# Patient Record
Sex: Male | Born: 1945 | Race: Black or African American | Hispanic: No | Smoking: Never smoker
Health system: Southern US, Community
[De-identification: ages and names within clinical notes are randomized; demographics above are authoritative.]

## PROBLEM LIST (undated history)

## (undated) DIAGNOSIS — D649 Anemia, unspecified: Secondary | ICD-10-CM

## (undated) DIAGNOSIS — E119 Type 2 diabetes mellitus without complications: Secondary | ICD-10-CM

## (undated) DIAGNOSIS — Z89519 Acquired absence of unspecified leg below knee: Secondary | ICD-10-CM

## (undated) DIAGNOSIS — I10 Essential (primary) hypertension: Secondary | ICD-10-CM

## (undated) DIAGNOSIS — S91301A Unspecified open wound, right foot, initial encounter: Secondary | ICD-10-CM

## (undated) DIAGNOSIS — I96 Gangrene, not elsewhere classified: Secondary | ICD-10-CM

## (undated) DIAGNOSIS — N189 Chronic kidney disease, unspecified: Secondary | ICD-10-CM

## (undated) DIAGNOSIS — I509 Heart failure, unspecified: Secondary | ICD-10-CM

## (undated) DIAGNOSIS — L03115 Cellulitis of right lower limb: Secondary | ICD-10-CM

## (undated) DIAGNOSIS — J189 Pneumonia, unspecified organism: Secondary | ICD-10-CM

## (undated) HISTORY — DX: Unspecified open wound, right foot, initial encounter: S91.301A

## (undated) HISTORY — DX: Cellulitis of right lower limb: L03.115

## (undated) HISTORY — PX: HAND SURGERY: SHX662

## (undated) HISTORY — DX: Gangrene, not elsewhere classified: I96

---

## 1998-01-08 ENCOUNTER — Ambulatory Visit (HOSPITAL_COMMUNITY): Admission: RE | Admit: 1998-01-08 | Discharge: 1998-01-08 | Payer: Self-pay | Admitting: Internal Medicine

## 1999-09-29 ENCOUNTER — Emergency Department (HOSPITAL_COMMUNITY): Admission: EM | Admit: 1999-09-29 | Discharge: 1999-09-30 | Payer: Self-pay | Admitting: Emergency Medicine

## 1999-09-29 ENCOUNTER — Encounter: Payer: Self-pay | Admitting: Emergency Medicine

## 1999-11-16 ENCOUNTER — Emergency Department (HOSPITAL_COMMUNITY): Admission: EM | Admit: 1999-11-16 | Discharge: 1999-11-16 | Payer: Self-pay | Admitting: Emergency Medicine

## 1999-11-16 ENCOUNTER — Encounter: Payer: Self-pay | Admitting: *Deleted

## 2000-09-07 ENCOUNTER — Emergency Department (HOSPITAL_COMMUNITY): Admission: EM | Admit: 2000-09-07 | Discharge: 2000-09-07 | Payer: Self-pay | Admitting: Emergency Medicine

## 2004-03-04 ENCOUNTER — Encounter: Admission: RE | Admit: 2004-03-04 | Discharge: 2004-06-02 | Payer: Self-pay | Admitting: Family Medicine

## 2005-05-30 ENCOUNTER — Emergency Department (HOSPITAL_COMMUNITY): Admission: EM | Admit: 2005-05-30 | Discharge: 2005-05-30 | Payer: Self-pay | Admitting: Family Medicine

## 2006-05-14 ENCOUNTER — Emergency Department (HOSPITAL_COMMUNITY): Admission: EM | Admit: 2006-05-14 | Discharge: 2006-05-14 | Payer: Self-pay | Admitting: Emergency Medicine

## 2010-12-01 ENCOUNTER — Inpatient Hospital Stay (INDEPENDENT_AMBULATORY_CARE_PROVIDER_SITE_OTHER)
Admission: RE | Admit: 2010-12-01 | Discharge: 2010-12-01 | Disposition: A | Discharge status: Home / Self Care | Payer: BC Managed Care – PPO | Source: Ambulatory Visit | Attending: Family Medicine | Admitting: Family Medicine

## 2010-12-01 DIAGNOSIS — R7989 Other specified abnormal findings of blood chemistry: Secondary | ICD-10-CM

## 2010-12-01 DIAGNOSIS — E119 Type 2 diabetes mellitus without complications: Secondary | ICD-10-CM

## 2010-12-01 LAB — POCT URINALYSIS DIP (DEVICE)
Bilirubin Urine: NEGATIVE
Glucose, UA: 500 mg/dL — AB
Ketones, ur: NEGATIVE mg/dL
Leukocytes, UA: NEGATIVE
Nitrite: NEGATIVE
Protein, ur: NEGATIVE mg/dL
Specific Gravity, Urine: 1.01 (ref 1.005–1.030)
Urobilinogen, UA: 0.2 mg/dL (ref 0.0–1.0)
pH: 5 (ref 5.0–8.0)

## 2010-12-01 LAB — GLUCOSE, CAPILLARY: Glucose-Capillary: 545 mg/dL — ABNORMAL HIGH (ref 70–99)

## 2012-04-15 ENCOUNTER — Emergency Department (HOSPITAL_COMMUNITY)
Admission: EM | Admit: 2012-04-15 | Discharge: 2012-04-15 | Discharge status: Against Medical Advice | Payer: BC Managed Care – PPO | Attending: Emergency Medicine | Admitting: Emergency Medicine

## 2012-04-15 ENCOUNTER — Encounter (HOSPITAL_COMMUNITY): Payer: Self-pay | Admitting: Emergency Medicine

## 2012-04-15 DIAGNOSIS — Y939 Activity, unspecified: Secondary | ICD-10-CM | POA: Insufficient documentation

## 2012-04-15 DIAGNOSIS — S61209A Unspecified open wound of unspecified finger without damage to nail, initial encounter: Secondary | ICD-10-CM | POA: Insufficient documentation

## 2012-04-15 DIAGNOSIS — Y929 Unspecified place or not applicable: Secondary | ICD-10-CM | POA: Insufficient documentation

## 2012-04-15 DIAGNOSIS — W230XXA Caught, crushed, jammed, or pinched between moving objects, initial encounter: Secondary | ICD-10-CM | POA: Insufficient documentation

## 2012-04-15 HISTORY — DX: Type 2 diabetes mellitus without complications: E11.9

## 2012-04-15 HISTORY — DX: Essential (primary) hypertension: I10

## 2012-04-15 NOTE — ED Notes (Signed)
No answer x3

## 2012-04-15 NOTE — ED Notes (Signed)
Unable to find, no answer, not in w/r, xray, b/r or entry way.

## 2012-04-15 NOTE — ED Notes (Signed)
Pt called,no answer.

## 2012-04-15 NOTE — ED Notes (Signed)
No answer, called again.

## 2012-04-15 NOTE — ED Notes (Signed)
Pt called by x-ray, no answer  

## 2012-04-15 NOTE — ED Notes (Signed)
PT. PRESENTS WITH RIGHT DISTAL TIP INDEX FINGER LACERATION , PT. STATES  ACCIDENTALLY CLOSED DOOR AGAINST FINGER THIS EVENING , + ETOH , DRESSING APPLIED AT TRIAGE.

## 2012-04-19 ENCOUNTER — Emergency Department (INDEPENDENT_AMBULATORY_CARE_PROVIDER_SITE_OTHER): Payer: BC Managed Care – PPO

## 2012-04-19 ENCOUNTER — Emergency Department (HOSPITAL_COMMUNITY)
Admission: EM | Admit: 2012-04-19 | Discharge: 2012-04-19 | Disposition: A | Discharge status: Home / Self Care | Payer: BC Managed Care – PPO | Source: Home / Self Care | Attending: Family Medicine | Admitting: Family Medicine

## 2012-04-19 ENCOUNTER — Encounter (HOSPITAL_COMMUNITY): Payer: Self-pay | Admitting: Emergency Medicine

## 2012-04-19 DIAGNOSIS — S62639B Displaced fracture of distal phalanx of unspecified finger, initial encounter for open fracture: Secondary | ICD-10-CM

## 2012-04-19 LAB — CBC WITH DIFFERENTIAL/PLATELET
Basophils Relative: 0 % (ref 0–1)
Eosinophils Absolute: 0.2 10*3/uL (ref 0.0–0.7)
Hemoglobin: 14.1 g/dL (ref 13.0–17.0)
Lymphs Abs: 3.4 10*3/uL (ref 0.7–4.0)
MCH: 31.5 pg (ref 26.0–34.0)
MCHC: 34.4 g/dL (ref 30.0–36.0)
Monocytes Relative: 5 % (ref 3–12)
Neutro Abs: 4.4 10*3/uL (ref 1.7–7.7)
Neutrophils Relative %: 52 % (ref 43–77)
Platelets: 214 10*3/uL (ref 150–400)
RBC: 4.47 MIL/uL (ref 4.22–5.81)

## 2012-04-19 LAB — POCT I-STAT, CHEM 8
Chloride: 104 mEq/L (ref 96–112)
HCT: 44 % (ref 39.0–52.0)
Hemoglobin: 15 g/dL (ref 13.0–17.0)
Potassium: 4.4 mEq/L (ref 3.5–5.1)
Sodium: 138 mEq/L (ref 135–145)

## 2012-04-19 MED ORDER — HYDROCODONE-ACETAMINOPHEN 5-500 MG PO TABS: 1.0000 | ORAL_TABLET | Freq: Four times a day (QID) | ORAL | Status: DC | PRN

## 2012-04-19 MED ORDER — CEFTRIAXONE SODIUM 1 G IJ SOLR
INTRAMUSCULAR | Status: AC
  Filled 2012-04-19: qty 10

## 2012-04-19 MED ORDER — LIDOCAINE HCL (PF) 1 % IJ SOLN
INTRAMUSCULAR | Status: AC
  Filled 2012-04-19: qty 5

## 2012-04-19 MED ORDER — CEFTRIAXONE SODIUM 1 G IJ SOLR
1.0000 g | Freq: Once | INTRAMUSCULAR | Status: AC
  Administered 2012-04-19: 1 g via INTRAMUSCULAR

## 2012-04-19 MED ORDER — CEPHALEXIN 500 MG PO CAPS: 500.0000 mg | ORAL_CAPSULE | Freq: Four times a day (QID) | ORAL | Status: DC

## 2012-04-19 NOTE — ED Notes (Signed)
Patient is sipping diet ginger ale while waiting for post injection wait time to end

## 2012-04-19 NOTE — ED Provider Notes (Signed)
History     CSN: [id number]  Arrival date & time 04/19/12  1341   First MD Initiated Contact with Patient 04/19/12 1416      Chief Complaint  Patient presents with  . Laceration    (Consider location/radiation/quality/duration/timing/severity/associated sxs/prior treatment) HPI Comments: 66 right-handed nonsmoker diabetic male. Here complaining of injury to his right index finger 4 days ago. Patient reports that she slammed the door on his index finger and sustained a laceration at the tip. Finger tip has been swollen and tender since. Patient went to the emergency department on the day of the injury and left without being evaluated by a provider. He reports his diabetes is under control and that he gets his medications prescribed at [facility] urgent care although there are no records of outpatient visits at [facility]. Last visit here was July last year for hyperglycemia and poorly controlled diabetes. Denies fever or chills. No nausea vomiting or diarrhea. Patient has self treated with peroxide and over-the-counter antibiotic.    Past Medical History  Diagnosis Date  . Hypertension   . Diabetes mellitus without complication     History reviewed. No pertinent past surgical history.  No family history on file.  History  Substance Use Topics  . Smoking status: Never Smoker   . Smokeless tobacco: Not on file  . Alcohol Use: Yes      Review of Systems  Constitutional: Negative for fever and chills.  Gastrointestinal: Negative for nausea, vomiting and abdominal pain.  Musculoskeletal:       Right index finger injury with laceration swelling and pain as per history of present illness.  Neurological: Negative for dizziness and headaches.  All other systems reviewed and are negative.    Allergies  Review of patient's allergies indicates no known allergies.  Home Medications   Current Outpatient Rx  Name  Route  Sig  Dispense  Refill  . OVER THE COUNTER MEDICATION      One  diabetic medicine One blood pressure medicine           BP 129/79  Pulse 63  Temp 98.7 F (37.1 C) (Oral)  Resp 16  SpO2 100%  Physical Exam  Nursing note and vitals reviewed. Constitutional: He is oriented to person, place, and time. He appears well-developed and well-nourished. No distress.  HENT:  Head: Normocephalic and atraumatic.  Cardiovascular: Normal heart sounds.   Pulmonary/Chest: Breath sounds normal.  Musculoskeletal:       Right index finger: Pale discoloration of the distal phalanx. There is a subungueal hematoma with no nail avulsion. There is a horizontal laceration with distortion of the digital pad with protruding subcutaneous tissue separating the tip of the distal digital pad, wound appears to be healing by second intention. No obvious exudates. Entire distal phalanx is mild to moderately swollen and tender to touch. Patient able to bend and extend distal phalanx with significant discomfort. There is tenderness to touch in the entire distal phalange including distal digital pad. There is no 2 point discrimination. Medial phalanx with minimal tender at the palmar side. Denies tenderness dorsally in the medial or proximal  phalanx. Able to flex and extend PIPJ and PIPJ with minimal discomfort. There are intact radial ulnar pulses with good perfusion of the  entire right hand except for the  right index distal phalanx there appears pale in general with poor capillary refill, although no obvious necrosis.   Neurological: He is alert and oriented to person, place, and time.  Skin: He is  not diaphoretic.       Right index finger laceration description as per musculoskeletal exam above.    ED Course  Procedures (including critical care time)  Labs Reviewed  POCT I-STAT, CHEM 8 - Abnormal; Notable for the following:    Glucose, Bld 241 (*)     All other components within normal limits  CBC WITH DIFFERENTIAL   Dg Finger Index Right  04/19/2012  *RADIOLOGY REPORT*   Clinical Data: Right index finger injury in a door.  Laceration.  RIGHT INDEX FINGER 2+V  Comparison: None.  Findings: Mildly comminuted tuft fracture of the distal phalanx has transverse and longitudinal components as best appreciated on the lateral projection.  Degenerative loss of articular space in the interphalangeal joints noted.  Cortical irregularity in the distal head of the proximal phalanx is observed on the lateral projection, with questionable associated periosteal reaction.  IMPRESSION:  1.  Comminuted fracture of the tuft of the distal phalanx. 2.  Faint cortical irregularity and possible periosteal reaction along the head of the proximal phalanx.  Although on the frontal and oblique projections this favors spurring, the irregularity on the lateral projection and equivocal periosteal reaction raise possibility of otherwise occult subtle nondisplaced fracture. 3.  Osteoarthritis.   Original Report Authenticated By: Gaylyn Rong, M.D.      1. Open fracture of tuft of distal phalanx of finger       MDM  66 year old diabetic male here with that open tuft fracture with subungueal hematoma and laceration with distortion of the distal digital pad of the right index finger.  4 days evolution. Has normal white count and glucose level is 241 today. Case was consulted with Dr. Merlyn Lot (hand specialist) with the assistance of his nurse.  Recommended coverage with antibiotics, dressing and splinting.  No oral intake after midnight tonight. Followup at Dr. Merrilee Seashore office tomorrow at 8 AM.        Sharin Grave, MD 04/19/12 [phone]

## 2012-04-19 NOTE — ED Notes (Signed)
Reports tetanus has been over 5, less than 10

## 2012-04-19 NOTE — ED Notes (Signed)
Patient caught finger in house door.  Laceration to right finger tip.  Index finger nail discolored.  Incident occurred Sunday.  Has used peroxide, antibiotic ointment and bandaged.  Tissue is white to the distal finger.

## 2012-06-30 ENCOUNTER — Ambulatory Visit (INDEPENDENT_AMBULATORY_CARE_PROVIDER_SITE_OTHER): Payer: BC Managed Care – PPO | Admitting: Family Medicine

## 2012-06-30 ENCOUNTER — Other Ambulatory Visit: Payer: Self-pay | Admitting: Family Medicine

## 2012-06-30 VITALS — BP 120/70 | HR 80 | Temp 98.0°F | Resp 17 | Ht 74.5 in | Wt 230.0 lb

## 2012-06-30 DIAGNOSIS — Z9119 Patient's noncompliance with other medical treatment and regimen: Secondary | ICD-10-CM

## 2012-06-30 DIAGNOSIS — E119 Type 2 diabetes mellitus without complications: Secondary | ICD-10-CM

## 2012-06-30 DIAGNOSIS — I10 Essential (primary) hypertension: Secondary | ICD-10-CM

## 2012-06-30 DIAGNOSIS — Z91199 Patient's noncompliance with other medical treatment and regimen due to unspecified reason: Secondary | ICD-10-CM

## 2012-06-30 LAB — POCT GLYCOSYLATED HEMOGLOBIN (HGB A1C): Hemoglobin A1C: 8.6

## 2012-06-30 MED ORDER — LISINOPRIL 20 MG PO TABS: 20.0000 mg | ORAL_TABLET | Freq: Every day | ORAL | Status: DC

## 2012-06-30 MED ORDER — METFORMIN HCL 1000 MG PO TABS: 1000.0000 mg | ORAL_TABLET | Freq: Two times a day (BID) | ORAL | Status: DC

## 2012-06-30 NOTE — Progress Notes (Signed)
[facility]:  Office Visit  Chief Complaint:  Chief Complaint  Patient presents with  . Medication Refill    HPI: Joseph [name] is a 67 y.o. male who complains of: 1. HTN-BP 120-130/ 80s at the drug store. Compliant with meds, no SEs 2. DM2-ran out of meds so has not been on meds BID but rather just once daily for several months, exercises and tries to eat the right foods, avoid pastries. Is not UTD on eye exam/flu vaccine. Denies numbness/tingling in [name], polydipsia,polyuria. Denies any hypoglycemia. Does not take sugars at home. .   Past Medical History  Diagnosis Date  . Hypertension   . Diabetes mellitus without complication    Past Surgical History  Procedure Date  . Hand surgery    History   Social History  . Marital Status: Single    Spouse Name: N/A    Number of Children: N/A  . Years of Education: N/A   Social History Main Topics  . Smoking status: Never Smoker   . Smokeless tobacco: None  . Alcohol Use: Yes  . Drug Use: No  . Sexually Active:    Other Topics Concern  . None   Social History Narrative  . None   No family history on file. No Known Allergies Prior to Admission medications   Medication Sig Start Date End Date Taking? Authorizing Provider  lisinopril (PRINIVIL,ZESTRIL) 20 MG tablet Take 20 mg by mouth daily.   Yes Historical Provider, MD  metFORMIN (GLUCOPHAGE) 1000 MG tablet Take 1,000 mg by mouth 2 (two) times daily with a meal.   Yes Historical Provider, MD  cephALEXin (KEFLEX) 500 MG capsule Take 1 capsule (500 mg total) by mouth 4 (four) times daily. 04/19/12   Adlih Moreno-Coll, MD  HYDROcodone-acetaminophen (VICODIN) 5-500 MG per tablet Take 1 tablet by mouth every 6 (six) hours as needed for pain. 04/19/12   Adlih Moreno-Coll, MD  OVER THE COUNTER MEDICATION One diabetic medicine One blood pressure medicine    Historical Provider, MD     ROS: The patient denies fevers, chills, night sweats, unintentional weight  loss, chest pain, palpitations, wheezing, dyspnea on exertion, nausea, vomiting, abdominal pain, dysuria, hematuria, melena, numbness, weakness, or tingling.   All other systems have been reviewed and were otherwise negative with the exception of those mentioned in the HPI and as above.    PHYSICAL EXAM: Filed Vitals:   06/30/12 1753  BP: 120/70  Pulse: 80  Temp: 98 F (36.7 C)  Resp: 17   Filed Vitals:   06/30/12 1753  Height: 6' 2.5" (1.892 m)  Weight: 230 lb (104.327 kg)   Body mass index is 29.14 kg/(m^2).  General: Alert, no acute distress HEENT:  Normocephalic, atraumatic, oropharynx patent. PERRLA, EOMI, fundoscopic exam nl Cardiovascular:  Regular rate and rhythm, no rubs murmurs or gallops.  No Carotid bruits, radial pulse intact. No pedal edema.  Respiratory: Clear to auscultation bilaterally.  No wheezes, rales, or rhonchi.  No cyanosis, no use of accessory musculature GI: No organomegaly, abdomen is soft and non-tender, positive bowel sounds.  No masses. Skin: No rashes. + Onychomycossis Neurologic: Facial musculature symmetric. Microfilament exam nl Psychiatric: Patient is appropriate throughout our interaction. Lymphatic: No cervical lymphadenopathy Musculoskeletal: Gait intact.   LABS: Results for orders placed in visit on 06/30/12  POCT GLYCOSYLATED HEMOGLOBIN (HGB A1C)      Component Value Range   Hemoglobin A1C 8.6%       EKG/XRAY:   Primary read interpreted  by Dr. Conley Rolls at [facility].   ASSESSMENT/PLAN: Encounter Diagnoses  Name Primary?  . DM (diabetes mellitus) Yes  . HTN (hypertension)   . Noncompliance    Advise patient to get eyes checked, take medicines as prescribed Decline flu vaccine Refilled meds F/u in 3 months   [name], DO 06/30/2012 7:22 PM

## 2012-07-01 LAB — COMPREHENSIVE METABOLIC PANEL
ALT: 28 U/L (ref 0–53)
AST: 28 U/L (ref 0–37)
Albumin: 4.1 g/dL (ref 3.5–5.2)
Alkaline Phosphatase: 35 U/L — ABNORMAL LOW (ref 39–117)
Calcium: 9.6 mg/dL (ref 8.4–10.5)
Chloride: 105 mEq/L (ref 96–112)
Potassium: 4.1 mEq/L (ref 3.5–5.3)
Sodium: 139 mEq/L (ref 135–145)
Total Protein: 6.9 g/dL (ref 6.0–8.3)

## 2012-07-01 LAB — COMPREHENSIVE METABOLIC PANEL WITH GFR
BUN: 12 mg/dL (ref 6–23)
CO2: 26 meq/L (ref 19–32)
Creat: 1.21 mg/dL (ref 0.50–1.35)
Glucose, Bld: 164 mg/dL — ABNORMAL HIGH (ref 70–99)
Total Bilirubin: 0.3 mg/dL (ref 0.3–1.2)

## 2012-07-02 DIAGNOSIS — E119 Type 2 diabetes mellitus without complications: Secondary | ICD-10-CM | POA: Insufficient documentation

## 2012-07-02 DIAGNOSIS — E1159 Type 2 diabetes mellitus with other circulatory complications: Secondary | ICD-10-CM | POA: Insufficient documentation

## 2012-07-02 DIAGNOSIS — I152 Hypertension secondary to endocrine disorders: Secondary | ICD-10-CM | POA: Insufficient documentation

## 2012-07-02 DIAGNOSIS — I10 Essential (primary) hypertension: Secondary | ICD-10-CM | POA: Insufficient documentation

## 2012-07-02 LAB — MICROALBUMIN, URINE: Microalb, Ur: 3.52 mg/dL — ABNORMAL HIGH (ref 0.00–1.89)

## 2012-07-04 ENCOUNTER — Telehealth: Payer: Self-pay | Admitting: Radiology

## 2012-07-04 NOTE — Telephone Encounter (Signed)
Called patient to advise on lab results. Left message for him to call me back.

## 2012-07-04 NOTE — Telephone Encounter (Signed)
Message copied by Caffie Damme on Wed Jul 04, 2012  3:30 PM ------      Message from: LE, [place] P      Created: Wed Jul 04, 2012  2:55 PM       Let him know that kidneys, liver, electrolytes are ok. His sugar is high. He needs to control his diabetes better, take his medicine and we will see him in 3 months

## 2012-07-06 NOTE — Telephone Encounter (Signed)
Called again, to advise. Left detailed message.

## 2013-02-12 ENCOUNTER — Ambulatory Visit: Payer: BC Managed Care – PPO | Admitting: Family Medicine

## 2013-02-12 VITALS — BP 128/80 | HR 76 | Temp 97.9°F | Resp 16 | Ht 74.5 in | Wt 228.6 lb

## 2013-02-12 DIAGNOSIS — I10 Essential (primary) hypertension: Secondary | ICD-10-CM

## 2013-02-12 DIAGNOSIS — E119 Type 2 diabetes mellitus without complications: Secondary | ICD-10-CM

## 2013-02-12 DIAGNOSIS — Z23 Encounter for immunization: Secondary | ICD-10-CM

## 2013-02-12 DIAGNOSIS — IMO0001 Reserved for inherently not codable concepts without codable children: Secondary | ICD-10-CM

## 2013-02-12 LAB — POCT GLYCOSYLATED HEMOGLOBIN (HGB A1C): Hemoglobin A1C: 9.3

## 2013-02-12 LAB — COMPREHENSIVE METABOLIC PANEL
ALT: 29 U/L (ref 0–53)
BUN: 13 mg/dL (ref 6–23)
CO2: 25 mEq/L (ref 19–32)
Calcium: 10 mg/dL (ref 8.4–10.5)
Chloride: 101 mEq/L (ref 96–112)
Creat: 1.07 mg/dL (ref 0.50–1.35)
Glucose, Bld: 118 mg/dL — ABNORMAL HIGH (ref 70–99)

## 2013-02-12 MED ORDER — SITAGLIPTIN PHOSPHATE 100 MG PO TABS: 100.0000 mg | ORAL_TABLET | Freq: Every day | ORAL | Status: DC

## 2013-02-12 MED ORDER — METFORMIN HCL 1000 MG PO TABS: 1000.0000 mg | ORAL_TABLET | Freq: Two times a day (BID) | ORAL | Status: DC

## 2013-02-12 MED ORDER — LISINOPRIL 20 MG PO TABS: 20.0000 mg | ORAL_TABLET | Freq: Every day | ORAL | Status: DC

## 2013-02-12 NOTE — Progress Notes (Signed)
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 He is diabetic. He is a Widower her for the past 3 years. He has a history of diabetes for several years. He does not smoke. He tries to get regular exercise and watch his diet. He checks his blood sugars, and knows that when he abuses things in his sugars go up. He generally takes his medicines regularly except if he forgets.  Basically he feels good. No major headaches dizziness chest pains shortness of breath et Karie Soda.  Objective: Well-developed man in no major acute distress eyes fundi benign. Throat clear. Neck supple without nodes thyromegaly. No carotid bruits. Chest clear to auscultation. Heart regular without murmurs. Extremities have good circulation with adequate pulses. Neurologic sensation is good in his feet.  Assessment: Diabetes Hypertension  Plan: Continue his same medications. Check the and comprehensive metabolic panel. Flu shot today  Results for orders placed in visit on 02/12/13  GLUCOSE, POCT (MANUAL RESULT ENTRY)      Result Value Range   POC Glucose 127 (*) 70 - 99 mg/dl  POCT GLYCOSYLATED HEMOGLOBIN (HGB A1C)      Result Value Range   Hemoglobin A1C 9.3     Diabetes is still not well controlled. We'll add Januvia 100 mg one daily. Have you return in 3 months for recheck

## 2013-02-12 NOTE — Patient Instructions (Addendum)
Continue current medications  Watch your diet and continue trying to get regular exercise  Return in about 4 months for recheck.  Flu shot given today  Take Januvia 1 a day in addition to your metformin

## 2013-02-17 ENCOUNTER — Encounter: Payer: Self-pay | Admitting: Family Medicine

## 2013-09-21 ENCOUNTER — Other Ambulatory Visit: Payer: Self-pay | Admitting: Family Medicine

## 2013-09-25 ENCOUNTER — Other Ambulatory Visit: Payer: Self-pay | Admitting: Family Medicine

## 2013-09-28 ENCOUNTER — Ambulatory Visit: Payer: BC Managed Care – PPO | Admitting: Emergency Medicine

## 2013-09-28 VITALS — BP 142/84 | HR 65 | Temp 98.3°F | Resp 18 | Ht 74.5 in | Wt 230.0 lb

## 2013-09-28 DIAGNOSIS — E119 Type 2 diabetes mellitus without complications: Secondary | ICD-10-CM

## 2013-09-28 DIAGNOSIS — I10 Essential (primary) hypertension: Secondary | ICD-10-CM

## 2013-09-28 LAB — POCT GLYCOSYLATED HEMOGLOBIN (HGB A1C): Hemoglobin A1C: 9.1

## 2013-09-28 MED ORDER — GLIMEPIRIDE 4 MG PO TABS: 4.0000 mg | ORAL_TABLET | Freq: Every day | ORAL | Status: DC

## 2013-09-28 MED ORDER — METFORMIN HCL 1000 MG PO TABS: 1000.0000 mg | ORAL_TABLET | Freq: Two times a day (BID) | ORAL | Status: DC

## 2013-09-28 MED ORDER — LISINOPRIL 20 MG PO TABS
20.0000 mg | ORAL_TABLET | Freq: Every day | ORAL | Status: DC
Start: 2013-09-28 — End: 2014-09-13

## 2013-09-28 NOTE — Progress Notes (Signed)
[facility] [address], [place] [id number] [phone]  Date:  09/28/2013   Name:  Joseph Bogus   DOB:  1946/03/21   MRN:  [id number]  PCP:  Abbe AmsterdamOPLAND,JESSICA, MD    Chief Complaint: Medication Refill   History of Present Illness:  Joseph [name] is a 68 y.o. very pleasant male patient who presents with the following:  Patient with NIDDM who is non compliant.  Out of medications for most of the week. No current complaints.   Non smoker.  Has not had colonoscopy.  No regular doctor.  Denies other complaint or health concern today.   Patient Active Problem List   Diagnosis Date Noted  . DM (diabetes mellitus) 07/02/2012  . HTN (hypertension) 07/02/2012    Past Medical History  Diagnosis Date  . Hypertension   . Diabetes mellitus without complication     Past Surgical History  Procedure Laterality Date  . Hand surgery      History  Substance Use Topics  . Smoking status: Never Smoker   . Smokeless tobacco: Not on file  . Alcohol Use: Yes    No family history on file.  No Known Allergies  Medication list has been reviewed and updated.  Current Outpatient Prescriptions on File Prior to Visit  Medication Sig Dispense Refill  . metFORMIN (GLUCOPHAGE) 1000 MG tablet Take 1 tablet (1,000 mg total) by mouth 2 (two) times daily. PATIENT NEEDS OFFICE VISIT FOR ADDITIONAL REFILLS  60 tablet  0  . lisinopril (PRINIVIL,ZESTRIL) 20 MG tablet Take 1 tablet (20 mg total) by mouth daily.  90 tablet  1  . sitaGLIPtin (JANUVIA) 100 MG tablet Take 1 tablet (100 mg total) by mouth daily.  90 tablet  1   No current facility-administered medications on file prior to visit.    Review of Systems:  As per HPI, otherwise negative.    Physical Examination: Filed Vitals:   09/28/13 1737  BP: 142/84  Pulse: 65  Temp: 98.3 F (36.8 C)  Resp: 18   Filed Vitals:   09/28/13 1737  Height: 6' 2.5" (1.892 m)  Weight: 230 lb (104.327 kg)   Body mass index is  29.14 kg/(m^2). Ideal Body Weight: Weight in (lb) to have BMI = 25: 196.9  GEN: WDWN, NAD, Non-toxic, A & O x 3 HEENT: Atraumatic, Normocephalic. Neck supple. No masses, No LAD. Ears and Nose: No external deformity. CV: RRR, No M/G/R. No JVD. No thrill. No extra heart sounds. PULM: CTA B, no wheezes, crackles, rhonchi. No retractions. No resp. distress. No accessory muscle use. ABD: S, NT, ND, +BS. No rebound. No HSM. EXTR: No c/c/e NEURO Normal gait.  PSYCH: Normally interactive. Conversant. Not depressed or anxious appearing.  Calm demeanor.    Assessment and Plan: NIDDM Future labs Future colonoscopy   Signed,  Phillips OdorJeffery Scarlet Abad, MD   Results for orders placed in visit on 09/28/13  POCT GLYCOSYLATED HEMOGLOBIN (HGB A1C)      Result Value Ref Range   Hemoglobin A1C 9.1

## 2013-09-28 NOTE — Patient Instructions (Signed)
Type 2 Diabetes Mellitus, Adult Type 2 diabetes mellitus, often simply referred to as type 2 diabetes, is a long-lasting (chronic) disease. In type 2 diabetes, the pancreas does not make enough insulin (a hormone), the cells are less responsive to the insulin that is made (insulin resistance), or both. Normally, insulin moves sugars from food into the tissue cells. The tissue cells use the sugars for energy. The lack of insulin or the lack of normal response to insulin causes excess sugars to build up in the blood instead of going into the tissue cells. As a result, high blood sugar (hyperglycemia) develops. The effect of high sugar (glucose) levels can cause many complications. Type 2 diabetes was also previously called adult-onset diabetes but it can occur at any age.  RISK FACTORS  A person is predisposed to developing type 2 diabetes if someone in the family has the disease and also has one or more of the following primary risk factors:  Overweight.  An inactive lifestyle.  A history of consistently eating high-calorie foods. Maintaining a normal weight and regular physical activity can reduce the chance of developing type 2 diabetes. SYMPTOMS  A person with type 2 diabetes may not show symptoms initially. The symptoms of type 2 diabetes appear slowly. The symptoms include:  Increased thirst (polydipsia).  Increased urination (polyuria).  Increased urination during the night (nocturia).  Weight loss. This weight loss may be rapid.  Frequent, recurring infections.  Tiredness (fatigue).  Weakness.  Vision changes, such as blurred vision.  Fruity smell to your breath.  Abdominal pain.  Nausea or vomiting.  Cuts or bruises which are slow to heal.  Tingling or numbness in the hands or feet. DIAGNOSIS Type 2 diabetes is frequently not diagnosed until complications of diabetes are present. Type 2 diabetes is diagnosed when symptoms or complications are present and when blood  glucose levels are increased. Your blood glucose level may be checked by one or more of the following blood tests:  A fasting blood glucose test. You will not be allowed to eat for at least 8 hours before a blood sample is taken.  A random blood glucose test. Your blood glucose is checked at any time of the day regardless of when you ate.  A hemoglobin A1c blood glucose test. A hemoglobin A1c test provides information about blood glucose control over the previous 3 months.  An oral glucose tolerance test (OGTT). Your blood glucose is measured after you have not eaten (fasted) for 2 hours and then after you drink a glucose-containing beverage. TREATMENT   You may need to take insulin or diabetes medicine daily to keep blood glucose levels in the desired range.  You will need to match insulin dosing with exercise and healthy food choices. The treatment goal is to maintain the before meal blood sugar (preprandial glucose) level at 70 130 mg/dL. HOME CARE INSTRUCTIONS   Have your hemoglobin A1c level checked twice a year.  Perform daily blood glucose monitoring as directed by your caregiver.  Monitor urine ketones when you are ill and as directed by your caregiver.  Take your diabetes medicine or insulin as directed by your caregiver to maintain your blood glucose levels in the desired range.  Never run out of diabetes medicine or insulin. It is needed every day.  Adjust insulin based on your intake of carbohydrates. Carbohydrates can raise blood glucose levels but need to be included in your diet. Carbohydrates provide vitamins, minerals, and fiber which are an essential part of   a healthy diet. Carbohydrates are found in fruits, vegetables, whole grains, dairy products, legumes, and foods containing added sugars.    Eat healthy foods. Alternate 3 meals with 3 snacks.  Lose weight if overweight.  Carry a medical alert card or wear your medical alert jewelry.  Carry a 15 gram  carbohydrate snack with you at all times to treat low blood glucose (hypoglycemia). Some examples of 15 gram carbohydrate snacks include:  Glucose tablets, 3 or 4   Glucose gel, 15 gram tube  Raisins, 2 tablespoons (24 grams)  Jelly beans, 6  Animal crackers, 8  Regular pop, 4 ounces (120 mL)  Gummy treats, 9  Recognize hypoglycemia. Hypoglycemia occurs with blood glucose levels of 70 mg/dL and below. The risk for hypoglycemia increases when fasting or skipping meals, during or after intense exercise, and during sleep. Hypoglycemia symptoms can include:  Tremors or shakes.  Decreased ability to concentrate.  Sweating.  Increased heart rate.  Headache.  Dry mouth.  Hunger.  Irritability.  Anxiety.  Restless sleep.  Altered speech or coordination.  Confusion.  Treat hypoglycemia promptly. If you are alert and able to safely swallow, follow the 15:15 rule:  Take 15 20 grams of rapid-acting glucose or carbohydrate. Rapid-acting options include glucose gel, glucose tablets, or 4 ounces (120 mL) of fruit juice, regular soda, or low fat milk.  Check your blood glucose level 15 minutes after taking the glucose.  Take 15 20 grams more of glucose if the repeat blood glucose level is still 70 mg/dL or below.  Eat a meal or snack within 1 hour once blood glucose levels return to normal.    Be alert to polyuria and polydipsia which are early signs of hyperglycemia. An early awareness of hyperglycemia allows for prompt treatment. Treat hyperglycemia as directed by your caregiver.  Engage in at least 150 minutes of moderate-intensity physical activity a week, spread over at least 3 days of the week or as directed by your caregiver. In addition, you should engage in resistance exercise at least 2 times a week or as directed by your caregiver.  Adjust your medicine and food intake as needed if you start a new exercise or sport.  Follow your sick day plan at any time you  are unable to eat or drink as usual.  Avoid tobacco use.  Limit alcohol intake to no more than 1 drink per day for nonpregnant women and 2 drinks per day for men. You should drink alcohol only when you are also eating food. Talk with your caregiver whether alcohol is safe for you. Tell your caregiver if you drink alcohol several times a week.  Follow up with your caregiver regularly.  Schedule an eye exam soon after the diagnosis of type 2 diabetes and then annually.  Perform daily skin and foot care. Examine your skin and feet daily for cuts, bruises, redness, nail problems, bleeding, blisters, or sores. A foot exam by a caregiver should be done annually.  Brush your teeth and gums at least twice a day and floss at least once a day. Follow up with your dentist regularly.  Share your diabetes management plan with your workplace or school.  Stay up-to-date with immunizations.  Learn to manage stress.  Obtain ongoing diabetes education and support as needed.  Participate in, or seek rehabilitation as needed to maintain or improve independence and quality of life. Request a physical or occupational therapy referral if you are having foot or hand numbness or difficulties with grooming,   dressing, eating, or physical activity. SEEK MEDICAL CARE IF:   You are unable to eat food or drink fluids for more than 6 hours.  You have nausea and vomiting for more than 6 hours.  Your blood glucose level is over 240 mg/dL.  There is a change in mental status.  You develop an additional serious illness.  You have diarrhea for more than 6 hours.  You have been sick or have had a fever for a couple of days and are not getting better.  You have pain during any physical activity.  SEEK IMMEDIATE MEDICAL CARE IF:  You have difficulty breathing.  You have moderate to large ketone levels. MAKE SURE YOU:  Understand these instructions.  Will watch your condition.  Will get help right away if  you are not doing well or get worse. Document Released: 05/16/2005 Document Revised: 02/08/2012 Document Reviewed: 12/13/2011 [employer] Patient Information 2014 [employer].  

## 2013-10-31 ENCOUNTER — Encounter: Payer: Self-pay | Admitting: Family Medicine

## 2013-11-01 ENCOUNTER — Other Ambulatory Visit: Payer: Self-pay | Admitting: Family Medicine

## 2013-11-01 NOTE — Progress Notes (Signed)
Mailed pt a copy of GI unable to reach letter, asked him to call

## 2014-03-08 ENCOUNTER — Emergency Department (INDEPENDENT_AMBULATORY_CARE_PROVIDER_SITE_OTHER)
Admission: EM | Admit: 2014-03-08 | Discharge: 2014-03-08 | Discharge status: Against Medical Advice | Payer: Managed Care, Other (non HMO) | Source: Home / Self Care

## 2014-03-08 ENCOUNTER — Encounter (HOSPITAL_COMMUNITY): Payer: Self-pay | Admitting: Emergency Medicine

## 2014-03-08 DIAGNOSIS — I159 Secondary hypertension, unspecified: Secondary | ICD-10-CM

## 2014-03-08 DIAGNOSIS — E118 Type 2 diabetes mellitus with unspecified complications: Secondary | ICD-10-CM

## 2014-03-08 NOTE — ED Provider Notes (Signed)
CSN: [id number]     Arrival date & time 03/08/14  1724 History   First MD Initiated Contact with Patient 03/08/14 1740     Chief Complaint  Patient presents with  . Medication Refill   (Consider location/radiation/quality/duration/timing/severity/associated sxs/prior Treatment) HPI Comments: 68 year old male with a history of hypertension and type 2 diabetes mellitus and medical noncompliance comes to the urgent care to have his blood pressure and diabetes managed. He has failed to follow up with the PCP who has initiated his management for various reasons not understood. He has been to urgent care Center past to receive his medications and he has not obtained recent lab work or other evaluations to determine status of diabetes control or evidence o end organ disease. He states he is not feeling well because his blood sugar is elevated. Meds include Metformin, lisinopril, Januvia and glimipiride. Pt has no reported or recorded BS readings   Past Medical History  Diagnosis Date  . Hypertension   . Diabetes mellitus without complication    Past Surgical History  Procedure Laterality Date  . Hand surgery     History reviewed. No pertinent family history. History  Substance Use Topics  . Smoking status: Never Smoker   . Smokeless tobacco: Not on file  . Alcohol Use: Yes    Review of Systems  HENT: Negative.   Respiratory: Negative for cough and shortness of breath.   Cardiovascular: Negative for chest pain.  Gastrointestinal: Negative.   Musculoskeletal: Negative.   Skin: Negative.   Neurological: Negative.     Allergies  Review of patient's allergies indicates no known allergies.  Home Medications   Prior to Admission medications   Medication Sig Start Date End Date Taking? Authorizing Provider  glimepiride (AMARYL) 4 MG tablet Take 1 tablet (4 mg total) by mouth daily before breakfast. 09/28/13   Carmelina DaneJeffery S Anderson, MD  lisinopril (PRINIVIL,ZESTRIL) 20 MG tablet Take 1  tablet (20 mg total) by mouth daily. 09/28/13   Carmelina DaneJeffery S Anderson, MD  metFORMIN (GLUCOPHAGE) 1000 MG tablet Take 1 tablet (1,000 mg total) by mouth 2 (two) times daily. PATIENT NEEDS OFFICE VISIT FOR ADDITIONAL REFILLS 09/28/13   Carmelina DaneJeffery S Anderson, MD  sitaGLIPtin (JANUVIA) 100 MG tablet Take 1 tablet (100 mg total) by mouth daily. 02/12/13   Peyton Najjaravid H Hopper, MD   BP 179/99  Pulse 73  Temp(Src) 97.9 F (36.6 C) (Oral)  Resp 16  SpO2 98% Physical Exam  Nursing note and vitals reviewed. Constitutional: He appears well-developed and well-nourished. No distress.  Neck: Normal range of motion. Neck supple.  Pulmonary/Chest: Effort normal. No respiratory distress.  Skin: Skin is warm and dry.    ED Course  Procedures (including critical care time) Labs Review Labs Reviewed - No data to display  Imaging Review No results found.   MDM   1. Type 2 diabetes mellitus with complication   2. Secondary hypertension, unspecified     It was reported that the pt left prior to having lab drawn. The pt had left his PCP for unknown reasons and admits he has not been compliant with meds, diet, glucose testing or attempts to locate a PCP. He has been visiting Urgent Cares who have strongly urged him to seek PCP for maintenance and proper care (per pt). He st he has chosen not to do this. He was advised of the potential danger with poorly controlled disease and that urgent cares are unable to provide optimal care, and that he needs complete care  with labs, blood sugar readings, history assure that meds are not harming him such as kidney or liver disease and are controlling his BS's.  Before rxing a partial refill we will need some blood work Designer, industrial/product(i-stat) as a very basic start and then make referrals to IM.       Hayden Rasmussenavid Arabela Basaldua, NP 03/08/14 [phone]

## 2014-03-08 NOTE — ED Notes (Signed)
Patient not in exam room or either waiting room

## 2014-03-08 NOTE — ED Provider Notes (Signed)
Medical screening examination/treatment/procedure(s) were performed by non-physician practitioner and as supervising physician I was immediately available for consultation/collaboration.  Wilhemenia Camba, M.D.  Karandeep Resende C Avrum Kimball, MD 03/08/14 1925 

## 2014-03-08 NOTE — ED Notes (Signed)
Patient not in room

## 2014-03-08 NOTE — ED Notes (Signed)
Needs refills for his medication

## 2014-03-15 ENCOUNTER — Emergency Department (HOSPITAL_COMMUNITY)
Admission: EM | Admit: 2014-03-15 | Discharge: 2014-03-15 | Disposition: A | Discharge status: Home / Self Care | Payer: Managed Care, Other (non HMO) | Attending: Emergency Medicine | Admitting: Emergency Medicine

## 2014-03-15 ENCOUNTER — Encounter (HOSPITAL_COMMUNITY): Payer: Self-pay | Admitting: Emergency Medicine

## 2014-03-15 DIAGNOSIS — E1165 Type 2 diabetes mellitus with hyperglycemia: Secondary | ICD-10-CM | POA: Insufficient documentation

## 2014-03-15 DIAGNOSIS — E118 Type 2 diabetes mellitus with unspecified complications: Secondary | ICD-10-CM

## 2014-03-15 DIAGNOSIS — I10 Essential (primary) hypertension: Secondary | ICD-10-CM | POA: Diagnosis not present

## 2014-03-15 DIAGNOSIS — IMO0001 Reserved for inherently not codable concepts without codable children: Secondary | ICD-10-CM

## 2014-03-15 DIAGNOSIS — Z79899 Other long term (current) drug therapy: Secondary | ICD-10-CM | POA: Diagnosis not present

## 2014-03-15 DIAGNOSIS — Z760 Encounter for issue of repeat prescription: Secondary | ICD-10-CM | POA: Diagnosis present

## 2014-03-15 DIAGNOSIS — R358 Other polyuria: Secondary | ICD-10-CM | POA: Insufficient documentation

## 2014-03-15 DIAGNOSIS — R739 Hyperglycemia, unspecified: Secondary | ICD-10-CM

## 2014-03-15 LAB — BASIC METABOLIC PANEL
Anion gap: 12 (ref 5–15)
BUN: 20 mg/dL (ref 6–23)
CALCIUM: 9.8 mg/dL (ref 8.4–10.5)
CHLORIDE: 97 meq/L (ref 96–112)
CO2: 24 meq/L (ref 19–32)
CREATININE: 1.14 mg/dL (ref 0.50–1.35)
GFR calc Af Amer: 76 mL/min — ABNORMAL LOW (ref >90)
GFR calc non Af Amer: 65 mL/min — ABNORMAL LOW (ref >90)
GLUCOSE: 456 mg/dL — AB (ref 70–99)
Potassium: 5 mEq/L (ref 3.7–5.3)
SODIUM: 133 meq/L — AB (ref 137–147)

## 2014-03-15 LAB — CBG MONITORING, ED
Glucose-Capillary: 487 mg/dL — ABNORMAL HIGH (ref 70–99)
Glucose-Capillary: 325 mg/dL — ABNORMAL HIGH (ref 70–99)

## 2014-03-15 MED ORDER — INSULIN ASPART 100 UNIT/ML ~~LOC~~ SOLN
8.0000 [IU] | Freq: Once | SUBCUTANEOUS | Status: DC
Start: 2014-03-15 — End: 2014-03-15
  Filled 2014-03-15: qty 1

## 2014-03-15 MED ORDER — GLIPIZIDE 5 MG PO TABS: 5.0000 mg | ORAL_TABLET | Freq: Every day | ORAL | Status: DC

## 2014-03-15 MED ORDER — SODIUM CHLORIDE 0.9 % IV BOLUS (SEPSIS)
1000.0000 mL | Freq: Once | INTRAVENOUS | Status: AC
  Administered 2014-03-15: 1000 mL via INTRAVENOUS

## 2014-03-15 MED ORDER — SODIUM CHLORIDE 0.9 % IV BOLUS (SEPSIS)
500.0000 mL | Freq: Once | INTRAVENOUS | Status: AC
  Administered 2014-03-15: 500 mL via INTRAVENOUS

## 2014-03-15 MED ORDER — METFORMIN HCL 1000 MG PO TABS: 1000.0000 mg | ORAL_TABLET | Freq: Two times a day (BID) | ORAL | Status: DC

## 2014-03-15 NOTE — Discharge Instructions (Signed)
It was our pleasure to provide your ER care today - we hope that you feel better.  Drink plenty of water. Follow diabetic diet. Take diabetic medications as prescribed. Check sugars 4/xday (before meals and at bedtime) and record values. Follow up with primary care doctor as planned this week.  Return to ER if worse, new symptoms, fevers, persistent vomiting, other concern.    Blood Glucose Monitoring Monitoring your blood glucose (also know as blood sugar) helps you to manage your diabetes. It also helps you and your health care provider monitor your diabetes and determine how well your treatment plan is working. WHY SHOULD YOU MONITOR YOUR BLOOD GLUCOSE?  It can help you understand how food, exercise, and medicine affect your blood glucose.  It allows you to know what your blood glucose is at any given moment. You can quickly tell if you are having low blood glucose (hypoglycemia) or high blood glucose (hyperglycemia).  It can help you and your health care provider know how to adjust your medicines.  It can help you understand how to manage an illness or adjust medicine for exercise. WHEN SHOULD YOU TEST? Your health care provider will help you decide how often you should check your blood glucose. This may depend on the type of diabetes you have, your diabetes control, or the types of medicines you are taking. Be sure to write down all of your blood glucose readings so that this information can be reviewed with your health care provider. See below for examples of testing times that your health care provider may suggest. Type 1 Diabetes  Test 4 times a day if you are in good control, using an insulin pump, or perform multiple daily injections.  If your diabetes is not well controlled or if you are sick, you may need to monitor more often.  It is a good idea to also monitor:  Before and after exercise.  Between meals and 2 hours after a meal.  Occasionally between 2:00 a.m. and  3:00 a.m. Type 2 Diabetes  It can vary with each person, but generally, if you are on insulin, test 4 times a day.  If you take medicines by mouth (orally), test 2 times a day.  If you are on a controlled diet, test once a day.  If your diabetes is not well controlled or if you are sick, you may need to monitor more often. HOW TO MONITOR YOUR BLOOD GLUCOSE Supplies Needed  Blood glucose meter.  Test strips for your meter. Each meter has its own strips. You must use the strips that go with your own meter.  A pricking needle (lancet).  A device that holds the lancet (lancing device).  A journal or log book to write down your results. Procedure  Wash your hands with soap and water. Alcohol is not preferred.  Prick the side of your finger (not the tip) with the lancet.  Gently milk the finger until a small drop of blood appears.  Follow the instructions that come with your meter for inserting the test strip, applying blood to the strip, and using your blood glucose meter. Other Areas to Get Blood for Testing Some meters allow you to use other areas of your body (other than your finger) to test your blood. These areas are called alternative sites. The most common alternative sites are:  The forearm.  The thigh.  The back area of the lower leg.  The palm of the hand. The blood flow in these areas  is slower. Therefore, the blood glucose values you get may be delayed, and the numbers are different from what you would get from your fingers. Do not use alternative sites if you think you are having hypoglycemia. Your reading will not be accurate. Always use a finger if you are having hypoglycemia. Also, if you cannot feel your lows (hypoglycemia unawareness), always use your fingers for your blood glucose checks. ADDITIONAL TIPS FOR GLUCOSE MONITORING  Do not reuse lancets.  Always carry your supplies with you.  All blood glucose meters have a 24-hour "hotline" number to call if  you have questions or need help.  Adjust (calibrate) your blood glucose meter with a control solution after finishing a few boxes of strips. BLOOD GLUCOSE RECORD KEEPING It is a good idea to keep a daily record or log of your blood glucose readings. Most glucose meters, if not all, keep your glucose records stored in the meter. Some meters come with the ability to download your records to your home computer. Keeping a record of your blood glucose readings is especially helpful if you are wanting to look for patterns. Make notes to go along with the blood glucose readings because you might forget what happened at that exact time. Keeping good records helps you and your health care provider to work together to achieve good diabetes management.  Document Released: 05/19/2003 Document Revised: 09/30/2013 Document Reviewed: 10/08/2012 [facility] Patient Information 2015 [place], [place]. This information is not intended to replace advice given to you by your health care provider. Make sure you discuss any questions you have with your health care provider.    Type 2 Diabetes Mellitus Type 2 diabetes mellitus, often simply referred to as type 2 diabetes, is a long-lasting (chronic) disease. In type 2 diabetes, the pancreas does not make enough insulin (a hormone), the cells are less responsive to the insulin that is made (insulin resistance), or both. Normally, insulin moves sugars from food into the tissue cells. The tissue cells use the sugars for energy. The lack of insulin or the lack of normal response to insulin causes excess sugars to build up in the blood instead of going into the tissue cells. As a result, high blood sugar (hyperglycemia) develops. The effect of high sugar (glucose) levels can cause many complications. Type 2 diabetes was also previously called adult-onset diabetes, but it can occur at any age.  RISK FACTORS  A person is predisposed to developing type 2 diabetes if someone in the  family has the disease and also has one or more of the following primary risk factors:  Overweight.  An inactive lifestyle.  A history of consistently eating high-calorie foods. Maintaining a normal weight and regular physical activity can reduce the chance of developing type 2 diabetes. SYMPTOMS  A person with type 2 diabetes may not show symptoms initially. The symptoms of type 2 diabetes appear slowly. The symptoms include:  Increased thirst (polydipsia).  Increased urination (polyuria).  Increased urination during the night (nocturia).  Weight loss. This weight loss may be rapid.  Frequent, recurring infections.  Tiredness (fatigue).  Weakness.  Vision changes, such as blurred vision.  Fruity smell to your breath.  Abdominal pain.  Nausea or vomiting.  Cuts or bruises which are slow to heal.  Tingling or numbness in the hands or feet. DIAGNOSIS Type 2 diabetes is frequently not diagnosed until complications of diabetes are present. Type 2 diabetes is diagnosed when symptoms or complications are present and when blood glucose levels are  increased. Your blood glucose level may be checked by one or more of the following blood tests:  A fasting blood glucose test. You will not be allowed to eat for at least 8 hours before a blood sample is taken.  A random blood glucose test. Your blood glucose is checked at any time of the day regardless of when you ate.  A hemoglobin A1c blood glucose test. A hemoglobin A1c test provides information about blood glucose control over the previous 3 months.  An oral glucose tolerance test (OGTT). Your blood glucose is measured after you have not eaten (fasted) for 2 hours and then after you drink a glucose-containing beverage. TREATMENT   You may need to take insulin or diabetes medicine daily to keep blood glucose levels in the desired range.  If you use insulin, you may need to adjust the dosage depending on the carbohydrates that  you eat with each meal or snack. The treatment goal is to maintain the before meal blood sugar (preprandial glucose) level at 70-130 mg/dL. HOME CARE INSTRUCTIONS   Have your hemoglobin A1c level checked twice a year.  Perform daily blood glucose monitoring as directed by your health care provider.  Monitor urine ketones when you are ill and as directed by your health care provider.  Take your diabetes medicine or insulin as directed by your health care provider to maintain your blood glucose levels in the desired range.  Never run out of diabetes medicine or insulin. It is needed every day.  If you are using insulin, you may need to adjust the amount of insulin given based on your intake of carbohydrates. Carbohydrates can raise blood glucose levels but need to be included in your diet. Carbohydrates provide vitamins, minerals, and fiber which are an essential part of a healthy diet. Carbohydrates are found in fruits, vegetables, whole grains, dairy products, legumes, and foods containing added sugars.  Eat healthy foods. You should make an appointment to see a registered dietitian to help you create an eating plan that is right for you.  Lose weight if you are overweight.  Carry a medical alert card or wear your medical alert jewelry.  Carry a 15-gram carbohydrate snack with you at all times to treat low blood glucose (hypoglycemia). Some examples of 15-gram carbohydrate snacks include:  Glucose tablets, 3 or 4.  Glucose gel, 15-gram tube.  Raisins, 2 tablespoons (24 grams).  Jelly beans, 6.  Animal crackers, 8.  Regular pop, 4 ounces (120 mL).  Gummy treats, 9.  Recognize hypoglycemia. Hypoglycemia occurs with blood glucose levels of 70 mg/dL and below. The risk for hypoglycemia increases when fasting or skipping meals, during or after intense exercise, and during sleep. Hypoglycemia symptoms can include:  Tremors or shakes.  Decreased ability to  concentrate.  Sweating.  Increased heart rate.  Headache.  Dry mouth.  Hunger.  Irritability.  Anxiety.  Restless sleep.  Altered speech or coordination.  Confusion.  Treat hypoglycemia promptly. If you are alert and able to safely swallow, follow the 15:15 rule:  Take 15-20 grams of rapid-acting glucose or carbohydrate. Rapid-acting options include glucose gel, glucose tablets, or 4 ounces (120 mL) of fruit juice, regular soda, or low-fat milk.  Check your blood glucose level 15 minutes after taking the glucose.  Take 15-20 grams more of glucose if the repeat blood glucose level is still 70 mg/dL or below.  Eat a meal or snack within 1 hour once blood glucose levels return to normal.  Be alert  to feeling very thirsty and urinating more frequently than usual, which are early signs of hyperglycemia. An early awareness of hyperglycemia allows for prompt treatment. Treat hyperglycemia as directed by your health care provider.  Engage in at least 150 minutes of moderate-intensity physical activity a week, spread over at least 3 days of the week or as directed by your health care provider. In addition, you should engage in resistance exercise at least 2 times a week or as directed by your health care provider. Try to spend no more than 90 minutes at one time inactive.  Adjust your medicine and food intake as needed if you start a new exercise or sport.  Follow your sick-day plan anytime you are unable to eat or drink as usual.  Do not use any tobacco products including cigarettes, chewing tobacco, or electronic cigarettes. If you need help quitting, ask your health care provider.  Limit alcohol intake to no more than 1 drink per day for nonpregnant women and 2 drinks per day for men. You should drink alcohol only when you are also eating food. Talk with your health care provider whether alcohol is safe for you. Tell your health care provider if you drink alcohol several times a  week.  Keep all follow-up visits as directed by your health care provider. This is important.  Schedule an eye exam soon after the diagnosis of type 2 diabetes and then annually.  Perform daily skin and foot care. Examine your skin and feet daily for cuts, bruises, redness, nail problems, bleeding, blisters, or sores. A foot exam by a health care provider should be done annually.  Brush your teeth and gums at least twice a day and floss at least once a day. Follow up with your dentist regularly.  Share your diabetes management plan with your workplace or school.  Stay up-to-date with immunizations. It is recommended that people with diabetes who are over 55 years old get the pneumonia vaccine. In some cases, two separate shots may be given. Ask your health care provider if your pneumonia vaccination is up-to-date.  Learn to manage stress.  Obtain ongoing diabetes education and support as needed.  Participate in or seek rehabilitation as needed to maintain or improve independence and quality of life. Request a physical or occupational therapy referral if you are having foot or hand numbness, or difficulties with grooming, dressing, eating, or physical activity. SEEK MEDICAL CARE IF:   You are unable to eat food or drink fluids for more than 6 hours.  You have nausea and vomiting for more than 6 hours.  Your blood glucose level is over 240 mg/dL.  There is a change in mental status.  You develop an additional serious illness.  You have diarrhea for more than 6 hours.  You have been sick or have had a fever for a couple of days and are not getting better.  You have pain during any physical activity.  SEEK IMMEDIATE MEDICAL CARE IF:  You have difficulty breathing.  You have moderate to large ketone levels. MAKE SURE YOU:  Understand these instructions.  Will watch your condition.  Will get help right away if you are not doing well or get worse. Document Released:  05/16/2005 Document Revised: 09/30/2013 Document Reviewed: 12/13/2011 [facility] Patient Information 2015 [place], [place]. This information is not intended to replace advice given to you by your health care provider. Make sure you discuss any questions you have with your health care provider.    Hyperglycemia Hyperglycemia occurs  when the glucose (sugar) in your blood is too high. Hyperglycemia can happen for many reasons, but it most often happens to people who do not know they have diabetes or are not managing their diabetes properly.  CAUSES  Whether you have diabetes or not, there are other causes of hyperglycemia. Hyperglycemia can occur when you have diabetes, but it can also occur in other situations that you might not be as aware of, such as: Diabetes  If you have diabetes and are having problems controlling your blood glucose, hyperglycemia could occur because of some of the following reasons:  Not following your meal plan.  Not taking your diabetes medications or not taking it properly.  Exercising less or doing less activity than you normally do.  Being sick. Pre-diabetes  This cannot be ignored. Before people develop Type 2 diabetes, they almost always have "pre-diabetes." This is when your blood glucose levels are higher than normal, but not yet high enough to be diagnosed as diabetes. Research has shown that some long-term damage to the body, especially the heart and circulatory system, may already be occurring during pre-diabetes. If you take action to manage your blood glucose when you have pre-diabetes, you may delay or prevent Type 2 diabetes from developing. Stress  If you have diabetes, you may be "diet" controlled or on oral medications or insulin to control your diabetes. However, you may find that your blood glucose is higher than usual in the hospital whether you have diabetes or not. This is often referred to as "stress hyperglycemia." Stress can elevate your blood  glucose. This happens because of hormones put out by the body during times of stress. If stress has been the cause of your high blood glucose, it can be followed regularly by your caregiver. That way he/she can make sure your hyperglycemia does not continue to get worse or progress to diabetes. Steroids  Steroids are medications that act on the infection fighting system (immune system) to block inflammation or infection. One side effect can be a rise in blood glucose. Most people can produce enough extra insulin to allow for this rise, but for those who cannot, steroids make blood glucose levels go even higher. It is not unusual for steroid treatments to "uncover" diabetes that is developing. It is not always possible to determine if the hyperglycemia will go away after the steroids are stopped. A special blood test called an A1c is sometimes done to determine if your blood glucose was elevated before the steroids were started. SYMPTOMS  Thirsty.  Frequent urination.  Dry mouth.  Blurred vision.  Tired or fatigue.  Weakness.  Sleepy.  Tingling in feet or leg. DIAGNOSIS  Diagnosis is made by monitoring blood glucose in one or all of the following ways:  A1c test. This is a chemical found in your blood.  Fingerstick blood glucose monitoring.  Laboratory results. TREATMENT  First, knowing the cause of the hyperglycemia is important before the hyperglycemia can be treated. Treatment may include, but is not be limited to:  Education.  Change or adjustment in medications.  Change or adjustment in meal plan.  Treatment for an illness, infection, etc.  More frequent blood glucose monitoring.  Change in exercise plan.  Decreasing or stopping steroids.  Lifestyle changes. HOME CARE INSTRUCTIONS   Test your blood glucose as directed.  Exercise regularly. Your caregiver will give you instructions about exercise. Pre-diabetes or diabetes which comes on with stress is helped by  exercising.  Eat wholesome, balanced meals.  Eat often and at regular, fixed times. Your caregiver or nutritionist will give you a meal plan to guide your sugar intake.  Being at an ideal weight is important. If needed, losing as little as 10 to 15 pounds may help improve blood glucose levels. SEEK MEDICAL CARE IF:   You have questions about medicine, activity, or diet.  You continue to have symptoms (problems such as increased thirst, urination, or weight gain). SEEK IMMEDIATE MEDICAL CARE IF:   You are vomiting or have diarrhea.  Your breath smells fruity.  You are breathing faster or slower.  You are very sleepy or incoherent.  You have numbness, tingling, or pain in your feet or hands.  You have chest pain.  Your symptoms get worse even though you have been following your caregiver's orders.  If you have any other questions or concerns. Document Released: 11/09/2000 Document Revised: 08/08/2011 Document Reviewed: 09/12/2011 [facility] Patient Information 2015 [place], [place]. This information is not intended to replace advice given to you by your health care provider. Make sure you discuss any questions you have with your health care provider.    Diabetes Mellitus and Food It is important for you to manage your blood sugar (glucose) level. Your blood glucose level can be greatly affected by what you eat. Eating healthier foods in the appropriate amounts throughout the day at about the same time each day will help you control your blood glucose level. It can also help slow or prevent worsening of your diabetes mellitus. Healthy eating may even help you improve the level of your blood pressure and reach or maintain a healthy weight.  HOW CAN FOOD AFFECT ME? Carbohydrates Carbohydrates affect your blood glucose level more than any other type of food. Your dietitian will help you determine how many carbohydrates to eat at each meal and teach you how to count carbohydrates. Counting  carbohydrates is important to keep your blood glucose at a healthy level, especially if you are using insulin or taking certain medicines for diabetes mellitus. Alcohol Alcohol can cause sudden decreases in blood glucose (hypoglycemia), especially if you use insulin or take certain medicines for diabetes mellitus. Hypoglycemia can be a life-threatening condition. Symptoms of hypoglycemia (sleepiness, dizziness, and disorientation) are similar to symptoms of having too much alcohol.  If your health care provider has given you approval to drink alcohol, do so in moderation and use the following guidelines:  Women should not have more than one drink per day, and men should not have more than two drinks per day. One drink is equal to:  12 oz of beer.  5 oz of wine.  1 oz of hard liquor.  Do not drink on an empty stomach.  Keep yourself hydrated. Have water, diet soda, or unsweetened iced tea.  Regular soda, juice, and other mixers might contain a lot of carbohydrates and should be counted. WHAT FOODS ARE NOT RECOMMENDED? As you make food choices, it is important to remember that all foods are not the same. Some foods have fewer nutrients per serving than other foods, even though they might have the same number of calories or carbohydrates. It is difficult to get your body what it needs when you eat foods with fewer nutrients. Examples of foods that you should avoid that are high in calories and carbohydrates but low in nutrients include:  Trans fats (most processed foods list trans fats on the Nutrition Facts label).  Regular soda.  Juice.  Candy.  Sweets, such as cake, pie,  doughnuts, and cookies.  Fried foods. WHAT FOODS CAN I EAT? Have nutrient-rich foods, which will nourish your body and keep you healthy. The food you should eat also will depend on several factors, including:  The calories you need.  The medicines you take.  Your weight.  Your blood glucose level.  Your  blood pressure level.  Your cholesterol level. You also should eat a variety of foods, including:  Protein, such as meat, poultry, fish, tofu, nuts, and seeds (lean animal proteins are best).  Fruits.  Vegetables.  Dairy products, such as milk, cheese, and yogurt (low fat is best).  Breads, grains, pasta, cereal, rice, and beans.  Fats such as olive oil, trans fat-free margarine, canola oil, avocado, and olives. DOES EVERYONE WITH DIABETES MELLITUS HAVE THE SAME MEAL PLAN? Because every person with diabetes mellitus is different, there is not one meal plan that works for everyone. It is very important that you meet with a dietitian who will help you create a meal plan that is just right for you. Document Released: 02/10/2005 Document Revised: 05/21/2013 Document Reviewed: 04/12/2013 [employer] Patient Information 2015 [place], [place]. This information is not intended to replace advice given to you by your health care provider. Make sure you discuss any questions you have with your health care provider.

## 2014-03-15 NOTE — ED Notes (Signed)
Pt states that he has been out of his diabetic medications for 3 days. Pt states that he is in between jobs and is looking for a PCP. Pt states that he knows his CBG is "high"

## 2014-03-15 NOTE — ED Notes (Signed)
Pt brought back to room; [place], EMT and Crecencio McNikki S, RN present in room

## 2014-03-15 NOTE — ED Provider Notes (Signed)
CSN: [id number]     Arrival date & time 03/15/14  1049 History   First MD Initiated Contact with Patient 03/15/14 1100     Chief Complaint  Patient presents with  . Medication Refill     (Consider location/radiation/quality/duration/timing/severity/associated sxs/prior Treatment) The history is provided by the patient.  pt with hx niddm, and non compliance w diabetic management/meds, c/o being out of his medication in past week.  Mild polyuria, no dysuria. No fever or chills. No nv. Is eating and drinking normally. No wt loss or gain. Does not follow any particular diabetic diet.  States otherwise does not feel sick or ill. Has set up appt with a new pcp this coming week, but states he didn't want to wait until then to get started back on meds. Pt has glucometer at home, but hasnt check blood sugar in several days.      Past Medical History  Diagnosis Date  . Hypertension   . Diabetes mellitus without complication    Past Surgical History  Procedure Laterality Date  . Hand surgery     No family history on file. History  Substance Use Topics  . Smoking status: Never Smoker   . Smokeless tobacco: Not on file  . Alcohol Use: Yes    Review of Systems  Constitutional: Negative for fever and chills.  HENT: Negative for sore throat.   Eyes: Negative for visual disturbance.  Respiratory: Negative for shortness of breath.   Cardiovascular: Negative for chest pain.  Gastrointestinal: Negative for vomiting, abdominal pain and diarrhea.  Endocrine: Positive for polyuria. Negative for polydipsia and polyphagia.  Genitourinary: Negative for dysuria and flank pain.  Musculoskeletal: Negative for back pain and neck pain.  Skin: Negative for rash.  Neurological: Negative for syncope and headaches.  Hematological: Does not bruise/bleed easily.  Psychiatric/Behavioral: Negative for confusion.      Allergies  Review of patient's allergies indicates no known allergies.  Home  Medications   Prior to Admission medications   Medication Sig Start Date End Date Taking? Authorizing Provider  glimepiride (AMARYL) 4 MG tablet Take 1 tablet (4 mg total) by mouth daily before breakfast. 09/28/13   Carmelina DaneJeffery S Anderson, MD  lisinopril (PRINIVIL,ZESTRIL) 20 MG tablet Take 1 tablet (20 mg total) by mouth daily. 09/28/13   Carmelina DaneJeffery S Anderson, MD  metFORMIN (GLUCOPHAGE) 1000 MG tablet Take 1 tablet (1,000 mg total) by mouth 2 (two) times daily. PATIENT NEEDS OFFICE VISIT FOR ADDITIONAL REFILLS 09/28/13   Carmelina DaneJeffery S Anderson, MD  sitaGLIPtin (JANUVIA) 100 MG tablet Take 1 tablet (100 mg total) by mouth daily. 02/12/13   Peyton Najjaravid H Hopper, MD   BP 146/87  Pulse 75  Temp(Src) 97.6 F (36.4 C) (Oral)  SpO2 98% Physical Exam  Nursing note and vitals reviewed. Constitutional: He is oriented to person, place, and time. He appears well-developed and well-nourished. No distress.  HENT:  Head: Atraumatic.  Mouth/Throat: Oropharynx is clear and moist.  Eyes: Pupils are equal, round, and reactive to light.  Neck: Neck supple. No tracheal deviation present.  Cardiovascular: Normal rate, regular rhythm, normal heart sounds and intact distal pulses.   Pulmonary/Chest: Effort normal and breath sounds normal. No accessory muscle usage. No respiratory distress.  Abdominal: Soft. Bowel sounds are normal. He exhibits no distension. There is no tenderness.  Genitourinary:  No cva tenderness  Musculoskeletal: Normal range of motion. He exhibits no edema and no tenderness.  Neurological: He is alert and oriented to person, place, and time.  Steady  gait  Skin: Skin is warm and dry. He is not diaphoretic.  Psychiatric: He has a normal mood and affect.    ED Course  Procedures (including critical care time) Labs Review  Results for orders placed during the hospital encounter of 03/15/14  BASIC METABOLIC PANEL      Result Value Ref Range   Sodium 133 (*) 137 - 147 mEq/L   Potassium 5.0  3.7 - 5.3  mEq/L   Chloride 97  96 - 112 mEq/L   CO2 24  19 - 32 mEq/L   Glucose, Bld 456 (*) 70 - 99 mg/dL   BUN 20  6 - 23 mg/dL   Creatinine, Ser 0.451.14  0.50 - 1.35 mg/dL   Calcium 9.8  8.4 - 40.910.5 mg/dL   GFR calc non Af Amer 65 (*) >90 mL/min   GFR calc Af Amer 76 (*) >90 mL/min   Anion gap 12  5 - 15  CBG MONITORING, ED      Result Value Ref Range   Glucose-Capillary 487 (*) 70 - 99 mg/dL  CBG MONITORING, ED      Result Value Ref Range   Glucose-Capillary 325 (*) 70 - 99 mg/dL       MDM  Iv ns bolus.    Reviewed nursing notes and prior charts for additional history.   hco3 normal. subcut ins.  Po fluids.  Recheck glucose improved. Pt requests d/c to home.     Suzi RootsKevin E Lakeena Downie, MD 03/18/14 [phone]

## 2014-09-13 ENCOUNTER — Encounter (HOSPITAL_COMMUNITY): Payer: Self-pay | Admitting: Emergency Medicine

## 2014-09-13 ENCOUNTER — Emergency Department (HOSPITAL_COMMUNITY)
Admission: EM | Admit: 2014-09-13 | Discharge: 2014-09-13 | Disposition: A | Discharge status: Home / Self Care | Payer: Managed Care, Other (non HMO) | Attending: Emergency Medicine | Admitting: Emergency Medicine

## 2014-09-13 DIAGNOSIS — Z760 Encounter for issue of repeat prescription: Secondary | ICD-10-CM | POA: Diagnosis not present

## 2014-09-13 DIAGNOSIS — Z79899 Other long term (current) drug therapy: Secondary | ICD-10-CM | POA: Insufficient documentation

## 2014-09-13 DIAGNOSIS — I10 Essential (primary) hypertension: Secondary | ICD-10-CM | POA: Diagnosis not present

## 2014-09-13 DIAGNOSIS — E1165 Type 2 diabetes mellitus with hyperglycemia: Secondary | ICD-10-CM | POA: Diagnosis present

## 2014-09-13 DIAGNOSIS — R739 Hyperglycemia, unspecified: Secondary | ICD-10-CM

## 2014-09-13 LAB — I-STAT CHEM 8, ED
BUN: 23 mg/dL (ref 6–23)
CREATININE: 1.3 mg/dL (ref 0.50–1.35)
Calcium, Ion: 1.21 mmol/L (ref 1.13–1.30)
Chloride: 102 mmol/L (ref 96–112)
Glucose, Bld: 349 mg/dL — ABNORMAL HIGH (ref 70–99)
HEMATOCRIT: 43 % (ref 39.0–52.0)
HEMOGLOBIN: 14.6 g/dL (ref 13.0–17.0)
POTASSIUM: 4.1 mmol/L (ref 3.5–5.1)
Sodium: 137 mmol/L (ref 135–145)
TCO2: 21 mmol/L (ref 0–100)

## 2014-09-13 LAB — CBG MONITORING, ED
Glucose-Capillary: 346 mg/dL — ABNORMAL HIGH (ref 70–99)
Glucose-Capillary: 336 mg/dL — ABNORMAL HIGH (ref 70–99)

## 2014-09-13 MED ORDER — METFORMIN HCL 1000 MG PO TABS: 1000.0000 mg | ORAL_TABLET | Freq: Two times a day (BID) | ORAL | Status: DC

## 2014-09-13 MED ORDER — METFORMIN HCL 500 MG PO TABS
1000.0000 mg | ORAL_TABLET | Freq: Once | ORAL | Status: AC
  Administered 2014-09-13: 1000 mg via ORAL
  Filled 2014-09-13: qty 2

## 2014-09-13 MED ORDER — LISINOPRIL 20 MG PO TABS: 20.0000 mg | ORAL_TABLET | Freq: Every day | ORAL | Status: DC

## 2014-09-13 MED ORDER — GLIPIZIDE 5 MG PO TABS: 5.0000 mg | ORAL_TABLET | Freq: Every day | ORAL | Status: DC

## 2014-09-13 NOTE — ED Notes (Addendum)
Pt from home requesting a medication refill of metformin. He reports that he has been out for a couple of days and currently does not have a PCP. 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 Pt is hyperglycemic at 346 today.

## 2014-09-13 NOTE — Discharge Instructions (Signed)
Emergency Department Resource Guide °1) Find a Doctor and Pay Out of Pocket °Although you won't have to find out who is covered by your insurance plan, it is a good idea to ask around and get recommendations. You will then need to call the office and see if the doctor you have chosen will accept you as a new patient and what types of options they offer for patients who are self-pay. Some doctors offer discounts or will set up payment plans for their patients who do not have insurance, but you will need to ask so you aren't surprised when you get to your appointment. ° °2) Contact Your Local Health Department °Not all health departments have doctors that can see patients for sick visits, but many do, so it is worth a call to see if yours does. If you don't know where your local health department is, you can check in your phone book. The CDC also has a tool to help you locate your state's health department, and many state websites also have listings of all of their local health departments. ° °3) Find a Walk-in Clinic °If your illness is not likely to be very severe or complicated, you may want to try a walk in clinic. These are popping up all over the country in pharmacies, drugstores, and shopping centers. They're usually staffed by nurse practitioners or physician assistants that have been trained to treat common illnesses and complaints. They're usually fairly quick and inexpensive. However, if you have serious medical issues or chronic medical problems, these are probably not your best option. ° °No Primary Care Doctor: °- Call [facility] at  832-8000 - they can help you locate a primary care doctor that  accepts your insurance, provides certain services, etc. °- Physician Referral Service- 1-800-533-3463 ° °Chronic Pain Problems: °Organization         Address     Phone             Notes  °[facility]  (336) 297-2271 Patients need to be referred by their primary care doctor.  ° °Medication  Assistance: °Organization         Address     Phone             Notes  °[place] [facility] [address] °[place], [place] [address] (336) 641-8030 --Must be a resident of [place] °-- Must have NO insurance coverage whatsoever (no Medicaid/ Medicare, etc.) °-- The pt. MUST have a primary care doctor that directs their care regularly and follows them in the community °  °[facility]  (866) 331-1348   °[employer]  (888) 892-1162   ° °Agencies that provide inexpensive medical care: °Organization         Address     Phone             Notes  °[facility]  (336) 832-8035   °[facility]    (336) 832-7272   °[facility] [address] °[place], [place] [address] (336) 832-4777   °[facility] [address], °[place] (336) 271-4999   °[facility]    (336) 373-0678   °[facility]    (336) 272-1050   °[facility] ° [address], [place] Phone:  (336) 832-4444, Fax:  (336) 832-4440 Hours of Operation:  9 am - 6 pm, M-F.  Also accepts Medicaid/Medicare and self-pay.  °[facility] ° [address], [place] Phone: (336) 832-3150, Fax: (336) [phone]. Hours of Operation:  8:30 am - 5:30 pm, M-F.  Also accepts Medicaid and self-pay.  °[facility] [place] [address], [place] Phone: (336) 878-6027   °[facility] ° ° ° ° [address], [place], [place] (336)723-1848, Ext. 123 Mondays & Thursdays: 7-9 AM.  First 15 patients are seen on a first come, first serve basis. °  °[facility] [address] °[place], [place] [address] (336) 349-3220 Accepts Medicaid  ° °Medicaid-accepting [place] Providers: ° °Organization         Address     Phone             Notes  °[facility] [address], [place] (336) 641-2100 Also accepts self-pay patients.    °[facility] [address], [place] ° (336) 856-9996   °[facility] [address], [place] (336) 288-8857   °[facility] [address] [place] [address], [place] (336) 299-7000   °Veita Bland [address], [place]  ° (336) 373-1557 Only accepts [place] Access Medicaid patients after they have their name applied to their card.  ° °Self-Pay (no insurance) in [place]: ° °Organization         Address     Phone             Notes  °Sickle Cell Patients, [facility] [address], [place] (336) 832-1970   °[facility] [address], [place] (336) 832-4400   °[facility] [place] ° [address], [place] (336) 992-4800   °[facility]/Dr. Osei-Bonsu ° [address] [place] [address], [place] or [address], [place] (336) 841-8500 Phone number for both [place] and [place] locations is the same.  °[facility] [address], [place] (336) 299-0000   °[facility] [address] [place] [address], [place] or [address] (336) 852-7530 °(336) 878-2260   °[facility] [address], [place] (336) 350-1642, phone; (336) 294-5005, fax Sees patients 1st and 3rd Saturday of every month.  Must not qualify for public or private insurance (i.e. Medicaid, Medicare, [place] Health Choice, Veterans' Benefits) • Household income should be no more than 200% of the poverty level •The clinic cannot treat you if you are pregnant or think you are pregnant • Sexually transmitted diseases are not treated at the clinic.  ° ° °Dental Care: ° °Organization         Address     Phone             Notes  °[facility] [address], [place] (336) 641-6152 Accepts children up to age 21 who are enrolled in Medicaid or [place] Health Choice;  pregnant women with a Medicaid card; and children who have applied for Medicaid or [place] Health Choice, but were declined, whose parents can pay a reduced fee at time of service.  °[facility] [place]  [address], [place] (336) 641-7733 Accepts children up to age 21 who are enrolled in Medicaid or  Health Choice; pregnant women with a Medicaid card; and children who have applied for Medicaid   or [place] Health Choice, but were declined, whose parents can pay a reduced fee at time of service.  °[facility] ° [address], [place] (336) 641-4533 Patients are seen by appointment only. Walk-ins are not accepted. [facility] will see patients 18 years of age and older. °Monday - Tuesday (8am-5pm) °Most Wednesdays (8:30-5pm) °$30 per visit, cash only  °[facility] ° [address], [place] (336) 641-4533 Patients are seen by appointment only. Walk-ins are not accepted. [facility] will see patients 18 years of age and older. °One Wednesday Evening (Monthly: Volunteer Based).  $30 per visit, cash only  °[facility]  (919) 537-3737 for adults; Children under age 4, call Graduate Pediatric Dentistry at (919) [phone]. Children aged 4-14, please call (919) [phone] to request a pediatric application. ° Dental services are provided in all areas of dental care including fillings, crowns and bridges, complete and partial dentures, implants, gum treatment, root canals, and extractions. Preventive care is also provided. Treatment is provided to both adults and children. °Patients are selected via a lottery and there is often a waiting list. °  °[facility] [address], °[place] ° (336) 763-8833 [email or web address] °  °[facility] [address], [place], [place] (336)723-1848, Ext. 123 Second and Fourth Thursday of each month, opens at 6:30 AM; Clinic ends at 9 AM.   Patients are seen on a first-come first-served basis, and a limited number are seen during each clinic.  ° °[facility] ° [address], [place], [place] (336) 723-7904   Eligibility Requirements °You must have lived in [place], [place], or [place] counties for at least the last three months. °  You cannot be eligible for state or federal sponsored healthcare insurance, including Veterans Administration, Medicaid, or Medicare. °  You generally cannot be eligible for healthcare insurance through your employer.  °  How to apply: °Eligibility screenings are held every Tuesday and Wednesday afternoon from 1:00 pm until 4:00 pm. You do not need an appointment for the interview!  °[facility] [address], [place], [place] 336-631-2330   °[facility]  336-342-8273   °[facility]  336-703-3100   °[facility]  336-570-6415   ° °Behavioral Health Resources in the Community: °Intensive Outpatient Programs °Organization         Address     Phone             Notes  °[facility] [address], [place], [place] 336-878-6098   °[facility] [address], [place], [place] 336-832-9800   °[facility] [address], [place], [place] ° 336-882-2125   °[facility] [address],  °[place], [place] 1-800-853-5163 or 336-641-4981   ° ° °Substance Abuse Resources °Organization         Address     Phone             Notes  °[facility]  336-882-2125   °[facility]  336-784-9470   °[facility]  336-285-9073   °[facility]  336-845-3988   °Residential & Outpatient Substance Abuse Program  1-800-659-3381   °Psychological Services °Organization         Address     Phone             Notes  °[facility]  336- 832-9600   °[facility]  336- 378-7881   °[facility] [address],  [place] 1-800-853-5163 or 336-641-4981   ° °Mobile Crisis Teams °Organization         Address     Phone             Notes  °[facility], Mobile Crisis Care Unit  1-877-626-1772   °[facility] °[facility] ° [address] [place], [place] 336-834-9664   °Sharon DeEsch [address] °[place] 336-554-5454   ° °Self-Help/Support Groups °Organization         Address     Phone             Notes  °[facility] [place] - variety of support groups  336- 373-1402 Call for more information  °Narcotics Anonymous (NA), [facility] [address], °[place]  2 meetings at this location  ° °Residential Treatment Programs °Organization         Address     Phone             Notes  °[facility] [address],    °[place]  1-866-801-8205   °[facility] ° [address], [place], [place] 704-293-8524   °[facility] [address], [place] 336-845-3988 Admissions: 8am-3pm M-F  °[facility] [address]    °[place], [place] 336-841-1104   °[facility] [address], [place], [place] 336-379-7146   °[facility] [address]  °[place], [place] 336-285-9073   °[facility] - Intensive Outpatient [address], [place], [place] 336-852-3033   °[facility] [address]  °[place], [place] 1-877-615-2722 or 336-784-9470   °[facility] [address] [place], [place] 336-227-7417 Accepts Medicaid  °[facility] [address]  ° [place] 1-800-659-3381 Substance Abuse/Addiction Treatment  ° °[place] Behavioral Health Resources °Organization         Address     Phone             Notes  °[facility]  (888) 581-9988   °Julie Brannon, PhD [address] [place], [place]   (336) 349-5553 or (336) 951-0000   °[facility]   [address] °[place], [place] (336) 349-4454   °[facility] [address], [place], [place] (336) 342-8316 Insurance/Medicaid/sponsorship through [facility]  °[facility] [address]                                    [place], [place] (336) 342-8316 Therapy/tele-psych/case  °[facility] [address]  ° [place], [place] (336) 349-2233    °Dr. Arfeen  (336) 349-4544   °[facility] [place]  [employer] [place] [facility] 1) [address],  °2) [address], [place] °3)  [address], [place] (336) 349-3220 °(336) 342-7768 ° °(336) 342-8140   °[place] [facility] (336) 342-1394 or (336) 342-3537 (After Hours)    ° °   °

## 2014-09-13 NOTE — ED Provider Notes (Signed)
CSN: [id number]     Arrival date & time 09/13/14  1818 History   First MD Initiated Contact with Patient 09/13/14 1849     Chief Complaint  Patient presents with  . Hyperglycemia  . Medication Refill   HPI Patient presents to the emergency room requesting a refill of his metformin. Patient is a [employer] architecttruck driver. He has difficulty getting to appointments at his doctor's office. He has been otherwise keeping up with his routine healthcare. Patient states he has been losing weight. He has been getting his eye exams for his diabetes. He has been out of his medication for the last couple of days. He does admit to occasionally being noncompliant with his diabetic diet. Today after eating [place] fried chicken with some cookies his blood sugar was elevated at 346. He denies any trouble with nausea or vomiting or diarrhea. Denies any abdominal pain. Patient states he otherwise feels fine he would just like to make sure he gets a refill before he goes out on the road again. Past Medical History  Diagnosis Date  . Hypertension   . Diabetes mellitus without complication    Past Surgical History  Procedure Laterality Date  . Hand surgery     No family history on file. History  Substance Use Topics  . Smoking status: Never Smoker   . Smokeless tobacco: Not on file  . Alcohol Use: Yes    Review of Systems  All other systems reviewed and are negative.     Allergies  Review of patient's allergies indicates no known allergies.  Home Medications   Prior to Admission medications   Medication Sig Start Date End Date Taking? Authorizing Provider  glimepiride (AMARYL) 4 MG tablet Take 1 tablet (4 mg total) by mouth daily before breakfast. 09/28/13   Carmelina DaneJeffery S Anderson, MD  glipiZIDE (GLUCOTROL) 5 MG tablet Take 1 tablet (5 mg total) by mouth daily before breakfast. 03/15/14   Cathren LaineKevin Steinl, MD  lisinopril (PRINIVIL,ZESTRIL) 20 MG tablet Take 1 tablet (20 mg total) by mouth daily. 09/28/13   Carmelina DaneJeffery S  Anderson, MD  metFORMIN (GLUCOPHAGE) 1000 MG tablet Take 1 tablet (1,000 mg total) by mouth 2 (two) times daily. 03/15/14   Cathren LaineKevin Steinl, MD   BP 116/84 mmHg  Pulse 71  Temp(Src) 97.4 F (36.3 C) (Oral)  Resp 16  SpO2 99% Physical Exam  Constitutional: He appears well-developed and well-nourished. No distress.  HENT:  Head: Normocephalic and atraumatic.  Right Ear: External ear normal.  Left Ear: External ear normal.  Eyes: Conjunctivae are normal. Right eye exhibits no discharge. Left eye exhibits no discharge. No scleral icterus.  Neck: Neck supple. No tracheal deviation present.  Cardiovascular: Normal rate, regular rhythm and intact distal pulses.   Pulmonary/Chest: Effort normal and breath sounds normal. No stridor. No respiratory distress. He has no wheezes. He has no rales.  Abdominal: Soft. Bowel sounds are normal. He exhibits no distension. There is no tenderness. There is no rebound and no guarding.  Musculoskeletal: He exhibits no edema or tenderness.  Neurological: He is alert. He has normal strength. No cranial nerve deficit (no facial droop, extraocular movements intact, no slurred speech) or sensory deficit. He exhibits normal muscle tone. He displays no seizure activity. Coordination normal.  Skin: Skin is warm and dry. No rash noted.  Psychiatric: He has a normal mood and affect.  Nursing note and vitals reviewed.   ED Course  Procedures (including critical care time) Labs Review Labs Reviewed  I-STAT CHEM  8, ED - Abnormal; Notable for the following:    Glucose, Bld 349 (*)    All other components within normal limits  CBG MONITORING, ED - Abnormal; Notable for the following:    Glucose-Capillary 336 (*)    All other components within normal limits  CBG MONITORING, ED - Abnormal; Notable for the following:    Glucose-Capillary 346 (*)    All other components within normal limits    MDM   Final diagnoses:  Hyperglycemia    Pt is asymptomatic.  I  refilled his medications.  DIscussed importance of follow up with a primary care doctor.  He requested alternative names because he has some difficulty with the hours at his doctors office.    Linwood Dibbles, MD 09/13/14 [id number]

## 2015-06-19 ENCOUNTER — Encounter: Payer: Self-pay | Admitting: Family Medicine

## 2015-06-24 ENCOUNTER — Encounter: Payer: Self-pay | Admitting: Family Medicine

## 2017-01-07 ENCOUNTER — Ambulatory Visit (HOSPITAL_COMMUNITY)
Admission: EM | Admit: 2017-01-07 | Discharge: 2017-01-07 | Disposition: A | Discharge status: Home / Self Care | Payer: Managed Care, Other (non HMO) | Attending: Family Medicine | Admitting: Family Medicine

## 2017-01-07 ENCOUNTER — Encounter (HOSPITAL_COMMUNITY): Payer: Self-pay | Admitting: Family Medicine

## 2017-01-07 DIAGNOSIS — E119 Type 2 diabetes mellitus without complications: Secondary | ICD-10-CM

## 2017-01-07 DIAGNOSIS — Z760 Encounter for issue of repeat prescription: Secondary | ICD-10-CM

## 2017-01-07 MED ORDER — METFORMIN HCL 1000 MG PO TABS: 1000.0000 mg | ORAL_TABLET | Freq: Two times a day (BID) | ORAL | 3 refills | Status: DC

## 2017-01-07 MED ORDER — GLIPIZIDE 5 MG PO TABS: 5.0000 mg | ORAL_TABLET | Freq: Every day | ORAL | 3 refills | Status: DC

## 2017-01-07 NOTE — ED Provider Notes (Signed)
[facility]    CSN: [id number] Arrival date & time: 01/07/17  1902     History   Chief Complaint Chief Complaint  Patient presents with  . Medication Refill    HPI Joseph [name] is a 71 y.o. male.   This is 71 year old man who comes in to have his diabetes medications refilled. He ran out 2 days ago and his checked his blood sugar recently and it was 190. He lost his insurance card in the mail and will work on it beginning Monday.  He's looking for new doctor as well.  He denies any blurry vision, polyuria, weight loss or other acute symptoms.      Past Medical History:  Diagnosis Date  . Diabetes mellitus without complication (HCC)   . Hypertension     Patient Active Problem List   Diagnosis Date Noted  . DM (diabetes mellitus) (HCC) 07/02/2012  . HTN (hypertension) 07/02/2012    Past Surgical History:  Procedure Laterality Date  . HAND SURGERY         Home Medications    Prior to Admission medications   Medication Sig Start Date End Date Taking? Authorizing Provider  Aspirin-Caffeine (BC FAST PAIN RELIEF ARTHRITIS PO) Take 2 Packages by mouth as needed (arthritis pain).    [provider]  glipiZIDE (GLUCOTROL) 5 MG tablet Take 1 tablet (5 mg total) by mouth daily before breakfast. 01/07/17   Elvina Sidle, MD  lisinopril (PRINIVIL,ZESTRIL) 20 MG tablet Take 1 tablet (20 mg total) by mouth daily. 09/13/14   Linwood Dibbles, MD  metFORMIN (GLUCOPHAGE) 1000 MG tablet Take 1 tablet (1,000 mg total) by mouth 2 (two) times daily. 01/07/17   Elvina Sidle, MD  Multiple Vitamins-Minerals (MULTIVITAMIN ADULT PO) Take 1 tablet by mouth daily.    [provider]    Family History History reviewed. No pertinent family history.  Social History Social History  Substance Use Topics  . Smoking status: Never Smoker  . Smokeless tobacco: Not on file  . Alcohol use Yes     Allergies   Patient has no known allergies.   Review of  Systems Review of Systems   Physical Exam Triage Vital Signs ED Triage Vitals [01/07/17 1934]  Enc Vitals Group     BP (!) 92/59     Pulse Rate 82     Resp 18     Temp 98.1 F (36.7 C)     Temp src      SpO2 100 %     Weight      Height      Head Circumference      Peak Flow      Pain Score      Pain Loc      Pain Edu?      Excl. in GC?    No data found.   Updated Vital Signs BP (!) 92/59   Pulse 82   Temp 98.1 F (36.7 C)   Resp 18   SpO2 100%      Physical Exam  Constitutional: He is oriented to person, place, and time. He appears well-developed and well-nourished.  HENT:  Right Ear: External ear normal.  Left Ear: External ear normal.  Mouth/Throat: Oropharynx is clear and moist.  Eyes: Pupils are equal, round, and reactive to light. Conjunctivae are normal.  Neck: Normal range of motion. Neck supple.  Cardiovascular: Normal rate, regular rhythm and normal heart sounds.   Pulmonary/Chest: Effort normal and breath sounds normal.  Musculoskeletal: Normal range of motion.  Neurological: He is alert and oriented to person, place, and time.  Skin: Skin is warm and dry.  Nursing note and vitals reviewed.    UC Treatments / Results  Labs (all labs ordered are listed, but only abnormal results are displayed) Labs Reviewed - No data to display  EKG  EKG Interpretation None       Radiology No results found.  Procedures Procedures (including critical care time)  Medications Ordered in UC Medications - No data to display   Initial Impression / Assessment and Plan / UC Course  I have reviewed the triage vital signs and the nursing notes.  Pertinent labs & imaging results that were available during my care of the patient were reviewed by me and considered in my medical decision making (see chart for details).     Final Clinical Impressions(s) / UC Diagnoses   Final diagnoses:  Medication refill    New Prescriptions Current Discharge  Medication List     Fact that it medicines were refilled and patient was referred to a local clinic  Controlled Substance Prescriptions [place] Controlled Substance Registry consulted? Not Applicable   Elvina SidleLauenstein, Alvin Diffee, MD 01/07/17 1950

## 2017-01-07 NOTE — ED Triage Notes (Addendum)
Pt here for med refill on diabetes medication.

## 2017-08-12 ENCOUNTER — Ambulatory Visit (HOSPITAL_COMMUNITY)
Admission: EM | Admit: 2017-08-12 | Discharge: 2017-08-12 | Disposition: A | Discharge status: Home / Self Care | Payer: Managed Care, Other (non HMO) | Attending: Family Medicine | Admitting: Family Medicine

## 2017-08-12 ENCOUNTER — Other Ambulatory Visit: Payer: Self-pay

## 2017-08-12 ENCOUNTER — Encounter (HOSPITAL_COMMUNITY): Payer: Self-pay | Admitting: Emergency Medicine

## 2017-08-12 DIAGNOSIS — H43392 Other vitreous opacities, left eye: Secondary | ICD-10-CM

## 2017-08-12 NOTE — ED Triage Notes (Signed)
States there is something dark floating in left eye, no pain.  Speck does not block vision .  Onset of symptoms yesterday.

## 2017-08-12 NOTE — ED Provider Notes (Signed)
  [facility]   [id number] 08/12/17 Arrival Time: 1613  ASSESSMENT & PLAN:  1. Floaters in visual field, left    Information given so that he may schedule an ophthalmology f/u visit. If any abrupt worsening of current symptoms or flashes of light he agrees to proceed directly to the ED for evaluation. May f/u here as needed. Reviewed expectations re: course of current medical issues. Questions answered. Outlined signs and symptoms indicating need for more acute intervention. Patient verbalized understanding. After Visit Summary given.   SUBJECTIVE:  Joseph [name] is a 72 y.o. male who presents with complaint of fairly persistently seeing "something dark floating in my left eye." Onset gradual, approximately 1 day ago. Injury: no. Visual changes: no. Contact lens use: no. Self treatment: None. No h/o similar. No flashes of light reported. Does not see an eye doctor.  ROS: As per HPI.  OBJECTIVE:  Vitals:   08/12/17 1652  BP: 120/62  Pulse: 66  Resp: 20  Temp: 97.9 F (36.6 C)  TempSrc: Oral  SpO2: 100%    General appearance: alert; no distress Eyes: PERRLA; EOMI; conjunctiva normal bilaterally Neck: supple Lungs: clear to auscultation bilaterally Heart: regular rate and rhythm Skin: warm and dry Psychological: alert and cooperative; normal mood and affect  No Known Allergies  Past Medical History:  Diagnosis Date  . Diabetes mellitus without complication (HCC)   . Hypertension    Social History   Socioeconomic History  . Marital status: Single    Spouse name: Not on file  . Number of children: Not on file  . Years of education: Not on file  . Highest education level: Not on file  Social Needs  . Financial resource strain: Not on file  . Food insecurity - worry: Not on file  . Food insecurity - inability: Not on file  . Transportation needs - medical: Not on file  . Transportation needs - non-medical: Not on file  Occupational History  . Not  on file  Tobacco Use  . Smoking status: Never Smoker  Substance and Sexual Activity  . Alcohol use: Yes  . Drug use: No  . Sexual activity: Not on file  Other Topics Concern  . Not on file  Social History Narrative  . Not on file   Family History  Family history unknown: Yes   Past Surgical History:  Procedure Laterality Date  . HAND SURGERY       Mardella LaymanHagler, Anise Harbin, MD 08/17/17 [phone]

## 2018-05-08 ENCOUNTER — Encounter (HOSPITAL_COMMUNITY): Payer: Self-pay | Admitting: Emergency Medicine

## 2018-05-08 ENCOUNTER — Other Ambulatory Visit: Payer: Self-pay

## 2018-05-08 ENCOUNTER — Ambulatory Visit (HOSPITAL_COMMUNITY)
Admission: EM | Admit: 2018-05-08 | Discharge: 2018-05-08 | Disposition: A | Discharge status: Home / Self Care | Payer: Medicare Other | Attending: Family Medicine | Admitting: Family Medicine

## 2018-05-08 DIAGNOSIS — I10 Essential (primary) hypertension: Secondary | ICD-10-CM | POA: Diagnosis not present

## 2018-05-08 DIAGNOSIS — Z9119 Patient's noncompliance with other medical treatment and regimen: Secondary | ICD-10-CM | POA: Insufficient documentation

## 2018-05-08 DIAGNOSIS — Z91199 Patient's noncompliance with other medical treatment and regimen due to unspecified reason: Secondary | ICD-10-CM

## 2018-05-08 DIAGNOSIS — E1169 Type 2 diabetes mellitus with other specified complication: Secondary | ICD-10-CM | POA: Insufficient documentation

## 2018-05-08 DIAGNOSIS — Z760 Encounter for issue of repeat prescription: Secondary | ICD-10-CM | POA: Diagnosis not present

## 2018-05-08 MED ORDER — METFORMIN HCL 1000 MG PO TABS: 1000.0000 mg | ORAL_TABLET | Freq: Two times a day (BID) | ORAL | 0 refills | Status: DC

## 2018-05-08 MED ORDER — LISINOPRIL 20 MG PO TABS: 20.0000 mg | ORAL_TABLET | Freq: Every day | ORAL | 0 refills | Status: DC

## 2018-05-08 MED ORDER — GLIPIZIDE 5 MG PO TABS: 5.0000 mg | ORAL_TABLET | Freq: Every day | ORAL | 0 refills | Status: DC

## 2018-05-08 NOTE — Discharge Instructions (Signed)
It is important that you see your primary care doctor every 6 months. While you are on medications for diabetes and high blood pressure you need blood work at least every 6 months to check on your sugars, diabetes management, cholesterol, and kidney function You need to be careful with a diabetic diet.  She needs exercise every day  I am giving you a 90-day supply of medications.  You need to see your primary care doctor before these medicines run out.  You need to call now to set up an appointment soon.  We may not continue to refill your medications if you fail to get proper medical care

## 2018-05-08 NOTE — ED Triage Notes (Signed)
PT requests a med refill. His PCP retired.

## 2018-05-08 NOTE — ED Provider Notes (Signed)
[facility]    CSN: [id number] Arrival date & time: 05/08/18  1426     History   Chief Complaint Chief Complaint  Patient presents with  . Medication Refill    HPI Joseph [name] is a 72 y.o. male.   HPI  Patient is a noncompliant diabetic who is here for blood pressure and diabetes medication refill.  He has run out of all of his medicines.  He tells me that he ate sweet potatoes and fruit cake over the holidays.  He also tells me that his blood sugar was 120 this morning, and he is trying to take a walk every day. He has been here for medicine refills previously.  He states his doctor retired 6 months ago.  He indicates that he was seen in the same doctor for his primary care asked for DOT physicals.  He states he was there in June and got a DOT card for 2 years.  This is not the DOT standard, which indicates people that the hypertension should get cards every 1 year.  In any event, he is retired at this point and not driving.  Denies any chest pain, shortness of breath.  Denies polyphagia polyuria.  He does not think he has had any high or low sugars. He cannot tell me when he last had blood work.  I am pressed upon him the importance of regular medical care, preventative medicine, blood work to follow along with his kidneys, diabetes, and cardiovascular prevention.  He agrees to see a PCP in follow-up.   Past Medical History:  Diagnosis Date  . Diabetes mellitus without complication (HCC)   . Hypertension     Patient Active Problem List   Diagnosis Date Noted  . DM (diabetes mellitus) (HCC) 07/02/2012  . HTN (hypertension) 07/02/2012    Past Surgical History:  Procedure Laterality Date  . HAND SURGERY         Home Medications    Prior to Admission medications   Medication Sig Start Date End Date Taking? Authorizing Provider  Multiple Vitamins-Minerals (MULTIVITAMIN ADULT PO) Take 1 tablet by mouth daily.   Yes [provider]  Aspirin-Caffeine  (BC FAST PAIN RELIEF ARTHRITIS PO) Take 2 Packages by mouth as needed (arthritis pain).    [provider]  glipiZIDE (GLUCOTROL) 5 MG tablet Take 1 tablet (5 mg total) by mouth daily before breakfast. 05/08/18   Eustace Moore, MD  lisinopril (PRINIVIL,ZESTRIL) 20 MG tablet Take 1 tablet (20 mg total) by mouth daily. 05/08/18   Eustace Moore, MD  metFORMIN (GLUCOPHAGE) 1000 MG tablet Take 1 tablet (1,000 mg total) by mouth 2 (two) times daily. 05/08/18   Eustace Moore, MD    Family History Family History  Family history unknown: Yes    Social History Social History   Tobacco Use  . Smoking status: Never Smoker  Substance Use Topics  . Alcohol use: Yes  . Drug use: No     Allergies   Patient has no known allergies.   Review of Systems Review of Systems  Constitutional: Negative for chills and fever.  HENT: Negative for ear pain and sore throat.   Eyes: Negative for pain and visual disturbance.  Respiratory: Negative for cough and shortness of breath.   Cardiovascular: Negative for chest pain and palpitations.  Gastrointestinal: Negative for abdominal pain and vomiting.  Genitourinary: Negative for dysuria and hematuria.  Musculoskeletal: Negative for arthralgias and back pain.  Skin: Negative for color change  and rash.  Neurological: Negative for seizures and syncope.  Psychiatric/Behavioral: Negative for dysphoric mood. The patient is not nervous/anxious.   All other systems reviewed and are negative.    Physical Exam Triage Vital Signs ED Triage Vitals  Enc Vitals Group     BP 05/08/18 1517 (!) 137/93     Pulse Rate 05/08/18 1516 73     Resp 05/08/18 1516 16     Temp 05/08/18 1516 98.4 F (36.9 C)     Temp Source 05/08/18 1516 Oral     SpO2 05/08/18 1516 100 %   No data found.  Updated Vital Signs BP (!) 137/93   Pulse 80   Temp 98.4 F (36.9 C) (Oral)   Resp 16   SpO2 99%      Physical Exam  Constitutional: He appears  well-developed and well-nourished. No distress.  HENT:  Head: Normocephalic and atraumatic.  Right Ear: External ear normal.  Left Ear: External ear normal.  Mouth/Throat: Oropharynx is clear and moist.  Poor dentition, many absent teeth  Eyes: Pupils are equal, round, and reactive to light. Conjunctivae are normal.  Neck: Normal range of motion.  No bruit, no JVD  Cardiovascular: Normal rate and regular rhythm.  Frequent ectopy  Pulmonary/Chest: Effort normal and breath sounds normal. No respiratory distress.  Abdominal: Soft. Bowel sounds are normal. He exhibits no distension.  Musculoskeletal: Normal range of motion. He exhibits no edema.  Neurological: He is alert.  Skin: Skin is warm and dry.  Psychiatric: He has a normal mood and affect. His behavior is normal.  Poor judgment.  Talkative     UC Treatments / Results  Labs (all labs ordered are listed, but only abnormal results are displayed) Labs Reviewed - No data to display  EKG None  Radiology No results found.  Procedures Procedures (including critical care time)  Medications Ordered in UC Medications - No data to display  Initial Impression / Assessment and Plan / UC Course  I have reviewed the triage vital signs and the nursing notes.  Pertinent labs & imaging results that were available during my care of the patient were reviewed by me and considered in my medical decision making (see chart for details).     See HPI Final Clinical Impressions(s) / UC Diagnoses   Final diagnoses:  Essential hypertension  Type 2 diabetes mellitus with other specified complication, without long-term current use of insulin (HCC)  Medication refill  Noncompliance     Discharge Instructions     It is important that you see your primary care doctor every 6 months. While you are on medications for diabetes and high blood pressure you need blood work at least every 6 months to check on your sugars, diabetes management,  cholesterol, and kidney function You need to be careful with a diabetic diet.  She needs exercise every day  I am giving you a 90-day supply of medications.  You need to see your primary care doctor before these medicines run out.  You need to call now to set up an appointment soon.  We may not continue to refill your medications if you fail to get proper medical care    ED Prescriptions    Medication Sig Dispense Auth. Provider   glipiZIDE (GLUCOTROL) 5 MG tablet Take 1 tablet (5 mg total) by mouth daily before breakfast. 90 tablet Eustace Moore, MD   lisinopril (PRINIVIL,ZESTRIL) 20 MG tablet Take 1 tablet (20 mg total) by mouth daily. 90 tablet  Eustace MooreNelson, Jamonta Goerner Sue, MD   metFORMIN (GLUCOPHAGE) 1000 MG tablet Take 1 tablet (1,000 mg total) by mouth 2 (two) times daily. 180 tablet Eustace MooreNelson, Chanelle Hodsdon Sue, MD     Controlled Substance Prescriptions [place] Controlled Substance Registry consulted? Not Applicable   Eustace MooreNelson, Milind Raether Sue, MD 05/08/18 [phone]

## 2018-09-12 ENCOUNTER — Other Ambulatory Visit: Payer: Self-pay

## 2018-09-12 ENCOUNTER — Ambulatory Visit (INDEPENDENT_AMBULATORY_CARE_PROVIDER_SITE_OTHER): Payer: Medicare Other

## 2018-09-12 ENCOUNTER — Encounter (HOSPITAL_COMMUNITY): Payer: Self-pay | Admitting: Emergency Medicine

## 2018-09-12 ENCOUNTER — Ambulatory Visit (HOSPITAL_COMMUNITY)
Admission: EM | Admit: 2018-09-12 | Discharge: 2018-09-12 | Disposition: A | Discharge status: Home / Self Care | Payer: Medicare Other | Attending: Family Medicine | Admitting: Family Medicine

## 2018-09-12 DIAGNOSIS — S90811A Abrasion, right foot, initial encounter: Secondary | ICD-10-CM | POA: Diagnosis not present

## 2018-09-12 DIAGNOSIS — L97511 Non-pressure chronic ulcer of other part of right foot limited to breakdown of skin: Secondary | ICD-10-CM

## 2018-09-12 DIAGNOSIS — L03031 Cellulitis of right toe: Secondary | ICD-10-CM | POA: Diagnosis not present

## 2018-09-12 DIAGNOSIS — S99921A Unspecified injury of right foot, initial encounter: Secondary | ICD-10-CM

## 2018-09-12 DIAGNOSIS — L02611 Cutaneous abscess of right foot: Secondary | ICD-10-CM | POA: Diagnosis not present

## 2018-09-12 IMAGING — DX RIGHT FOOT COMPLETE - 3+ VIEW
3 series · 3 of 3 positions shown · non-contrast
Comparison: None.

CLINICAL DATA: Foot injury 1 week ago. Lateral foot pain with
wound. History of diabetes.

EXAM:
RIGHT FOOT COMPLETE - 3+ VIEW

[foot ap]
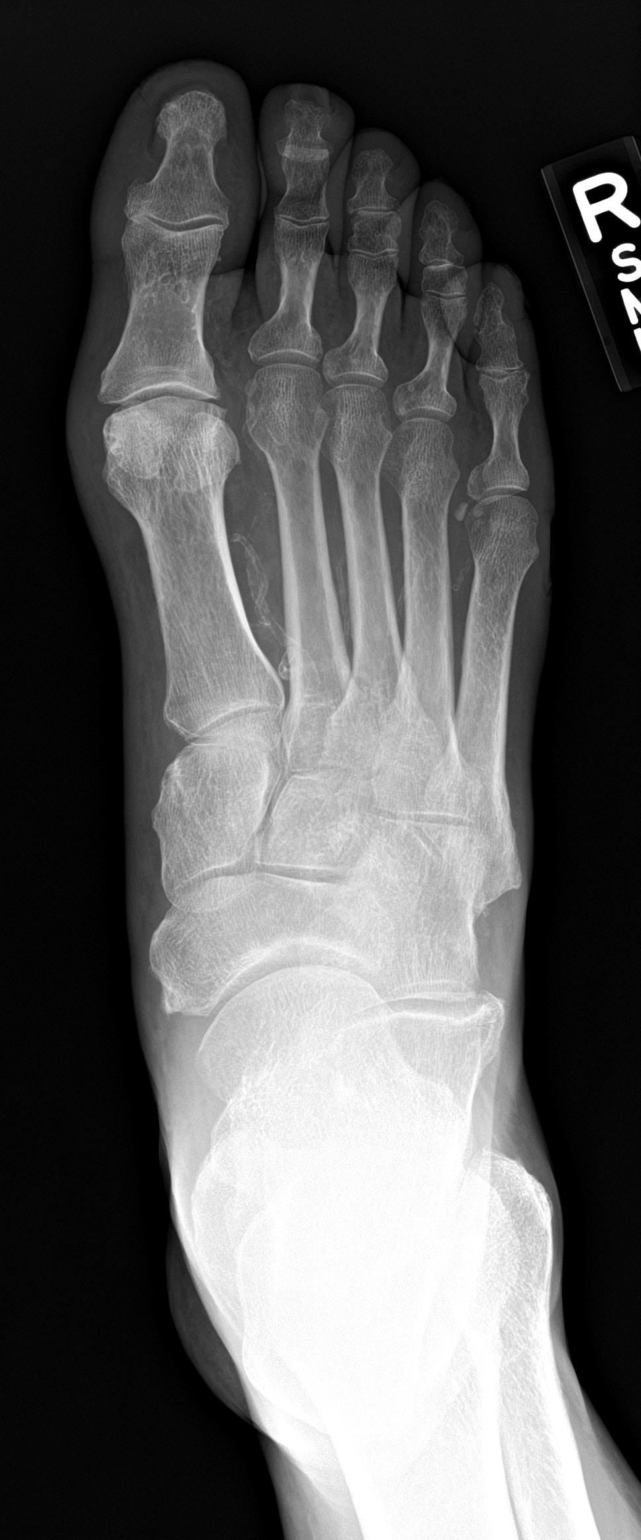

[foot obl]
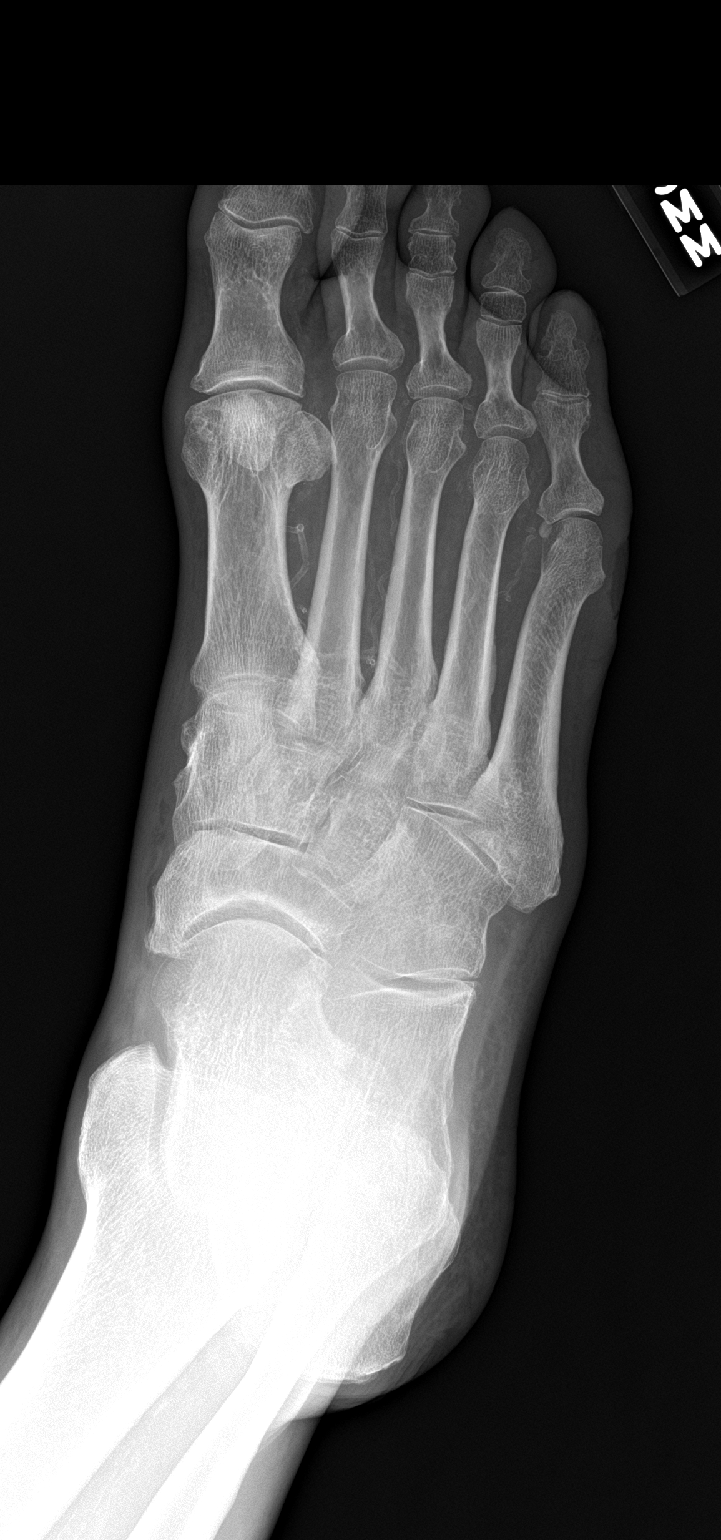

[foot lat]
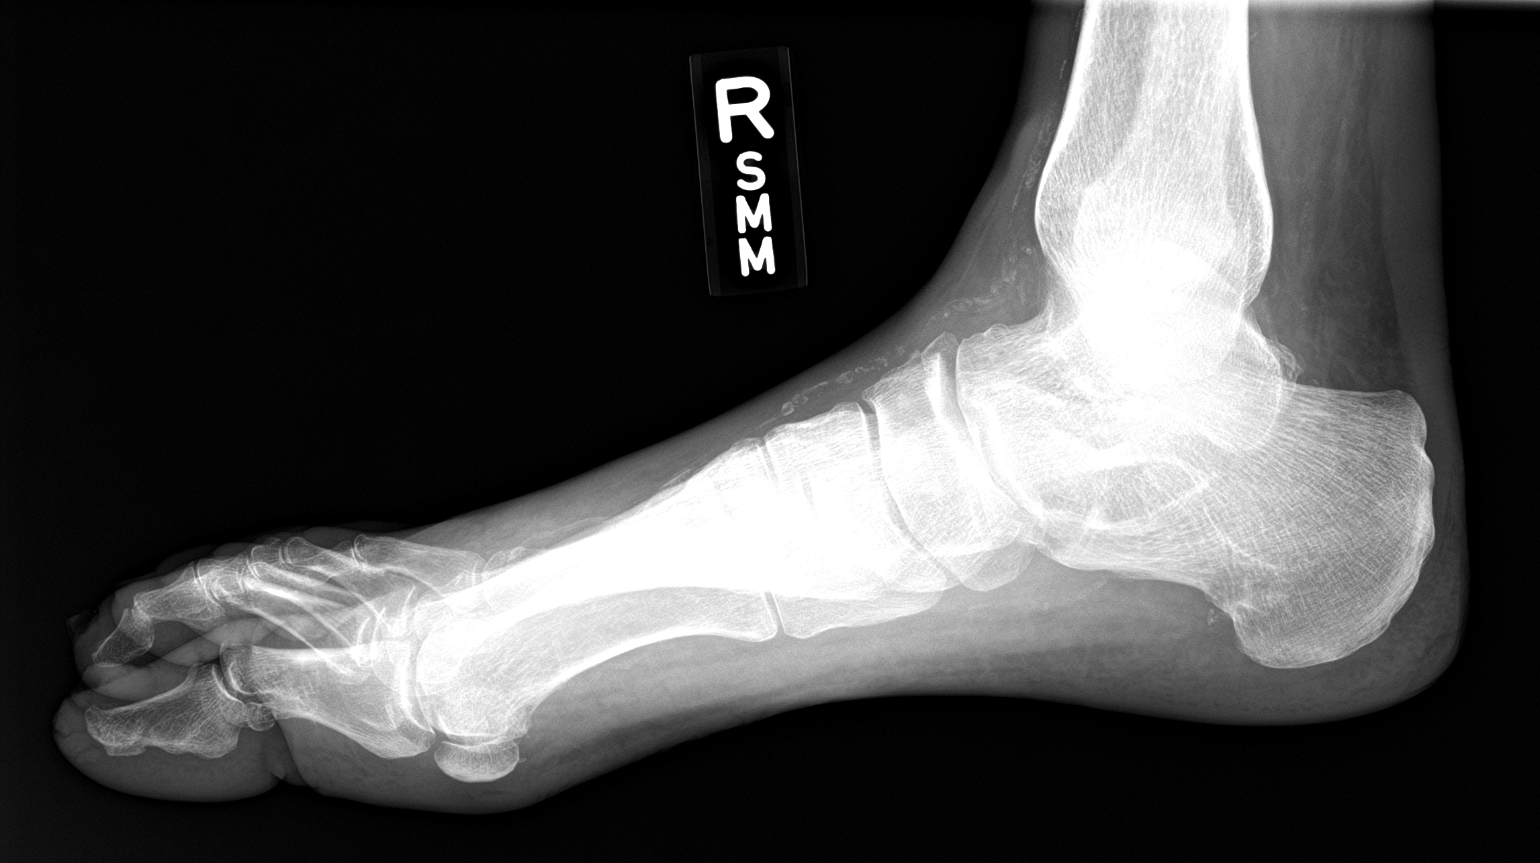

[3 of 3 positions shown; findings below may reference images not displayed]

FINDINGS: There is evidence of soft tissue ulceration adjacent to the head of
the fifth metatarsal. No soft tissue emphysema, radiopaque foreign
body, osseous erosion, acute fracture, or dislocation is identified.
Soft tissue swelling is noted along the dorsal forefoot. There is
mild spurring at the first MTP joint. Advanced atherosclerotic
vascular calcifications are present.
IMPRESSION: Soft tissue ulceration and swelling without evidence of acute
osseous abnormality.

## 2018-09-12 MED ORDER — KETOROLAC TROMETHAMINE 30 MG/ML IJ SOLN
30.0000 mg | Freq: Once | INTRAMUSCULAR | Status: AC
  Administered 2018-09-12: 30 mg via INTRAMUSCULAR

## 2018-09-12 MED ORDER — DOXYCYCLINE HYCLATE 100 MG PO CAPS: 100.0000 mg | ORAL_CAPSULE | Freq: Two times a day (BID) | ORAL | 0 refills | Status: DC

## 2018-09-12 MED ORDER — KETOROLAC TROMETHAMINE 30 MG/ML IJ SOLN
INTRAMUSCULAR | Status: AC
  Filled 2018-09-12: qty 1

## 2018-09-12 NOTE — Discharge Instructions (Signed)
Dressing applied X-rays did not show fracture or dislocation We will treat you for potential skin infection Rest and elevate foot Wash daily with warm water and mild soap Avoid submerging in water Keep covered to avoid friction Change bandages daily Prescribed doxycycline take as directed and to completion Continue to alternate ibuprofen and tylenol as needed for pain and fever Follow up with PCP,  Joaquin Courts FNP, or wound care, next week for recheck and ensure your symptoms are improving Call or go to the ED if you have any new or worsening symptoms such as increased pain, increased redness, increased swelling, discharge, high fever, night sweats, abdominal pain, etc..Marland Kitchen

## 2018-09-12 NOTE — ED Triage Notes (Signed)
Pt c/o R foot pain, states he did some yard work last week and felt a "twinge". States he pushed through and worked anyway and now his R foot is hurting. Pt also diabetic and has a sore on that foot.

## 2018-09-12 NOTE — ED Notes (Signed)
Received order from dr hagler for pain medicine-patient requesting something for pain

## 2018-09-12 NOTE — ED Provider Notes (Signed)
[facility]   [id number] 09/12/18 Arrival Time: 1349  CC: RT foot pain  SUBJECTIVE: History from: patient. Joseph [name] is a 73 y.o. male hx significant for DM, and HTN, complains of RT foot pain x 1 week.  Denies a precipitating event or specific injury, but noticed symptoms after performing yard work.  States he was removing a tree stump from his yard.  Localizes the pain to the RT top and outside of foot.  Describes the pain as intermittent.  Has tried OTC medications including BC powder with minimal relief.  Symptoms are made worse with walking.  Denies similar symptoms in the past.  Complains of associated swelling and redness.  Denies fever, chills, nausea, vomiting, ecchymosis, weakness, numbness and tingling.      ROS: As per HPI.  Past Medical History:  Diagnosis Date  . Diabetes mellitus without complication (HCC)   . Hypertension    Past Surgical History:  Procedure Laterality Date  . HAND SURGERY     No Known Allergies No current facility-administered medications on file prior to encounter.    Current Outpatient Medications on File Prior to Encounter  Medication Sig Dispense Refill  . Aspirin-Caffeine (BC FAST PAIN RELIEF ARTHRITIS PO) Take 2 Packages by mouth as needed (arthritis pain).    Marland Kitchen glipiZIDE (GLUCOTROL) 5 MG tablet Take 1 tablet (5 mg total) by mouth daily before breakfast. 90 tablet 0  . lisinopril (PRINIVIL,ZESTRIL) 20 MG tablet Take 1 tablet (20 mg total) by mouth daily. 90 tablet 0  . metFORMIN (GLUCOPHAGE) 1000 MG tablet Take 1 tablet (1,000 mg total) by mouth 2 (two) times daily. 180 tablet 0  . Multiple Vitamins-Minerals (MULTIVITAMIN ADULT PO) Take 1 tablet by mouth daily.     Social History   Socioeconomic History  . Marital status: Single    Spouse name: Not on file  . Number of children: Not on file  . Years of education: Not on file  . Highest education level: Not on file  Occupational History  . Not on file  Social Needs  .  Financial resource strain: Not on file  . Food insecurity:    Worry: Not on file    Inability: Not on file  . Transportation needs:    Medical: Not on file    Non-medical: Not on file  Tobacco Use  . Smoking status: Never Smoker  Substance and Sexual Activity  . Alcohol use: Yes  . Drug use: No  . Sexual activity: Not on file  Lifestyle  . Physical activity:    Days per week: Not on file    Minutes per session: Not on file  . Stress: Not on file  Relationships  . Social connections:    Talks on phone: Not on file    Gets together: Not on file    Attends religious service: Not on file    Active member of club or organization: Not on file    Attends meetings of clubs or organizations: Not on file    Relationship status: Not on file  . Intimate partner violence:    Fear of current or ex partner: Not on file    Emotionally abused: Not on file    Physically abused: Not on file    Forced sexual activity: Not on file  Other Topics Concern  . Not on file  Social History Narrative  . Not on file   Family History  Family history unknown: Yes    OBJECTIVE:  Vitals:  09/12/18 1410  BP: 131/70  Pulse: 91  Resp: 16  Temp: 98.2 F (36.8 C)  SpO2: 97%    General appearance: Alert; in no acute distress.  Head: NCAT Lungs: normal respiratory effort CV: Cap refill < 2 seconds dorsalis pedis pulses 1+ bilaterally Musculoskeletal: RT foot Inspection: Abrasion (approx 3 cm in diameter) over distal dorsum of foot and shallow ulcer (approx 1-2 cm in diameter) over distal lateral 5th MT with surrounding erythema and mild swelling about the distal foot (see pictures below);  no obvious drainage or bleeding; dry skin and thickened toenails present on rt foot Palpation: TTP over distal dorsum of foot and lateral distal 5th MT over ulcer ROM: FROM active and passive Strength: 5/5 dorsiflexion, 5/5 plantar flexion Skin: warm and dry Neurologic: Ambulates with difficulty; Sensation  intact about the lower extremities Psychological: alert and cooperative; normal mood and affect         DIAGNOSTIC STUDIES:  Dg Foot Complete Right  Result Date: 09/12/2018 CLINICAL DATA:  Foot injury 1 week ago. Lateral foot pain with wound. History of diabetes. EXAM: RIGHT FOOT COMPLETE - 3+ VIEW COMPARISON:  None. FINDINGS: There is evidence of soft tissue ulceration adjacent to the head of the fifth metatarsal. No soft tissue emphysema, radiopaque foreign body, osseous erosion, acute fracture, or dislocation is identified. Soft tissue swelling is noted along the dorsal forefoot. There is mild spurring at the first MTP joint. Advanced atherosclerotic vascular calcifications are present. IMPRESSION: Soft tissue ulceration and swelling without evidence of acute osseous abnormality. Electronically Signed   By: Sebastian AcheAllen  Grady M.D.   On: 09/12/2018 15:23     ASSESSMENT & PLAN:  1. Cellulitis and abscess of toe of right foot   2. Skin ulcer of right foot, limited to breakdown of skin (HCC)   3. Abrasion, right foot, initial encounter     Meds ordered this encounter  Medications  . doxycycline (VIBRAMYCIN) 100 MG capsule    Sig: Take 1 capsule (100 mg total) by mouth 2 (two) times daily.    Dispense:  20 capsule    Refill:  0    Order Specific Question:   Supervising Provider    Answer:   Eustace MooreNELSON, YVONNE SUE [[id number]]  . ketorolac (TORADOL) 30 MG/ML injection 30 mg   Dressing applied X-rays did not show fracture or dislocation We will treat you for potential skin infection Rest and elevate foot Wash daily with warm water and mild soap Avoid submerging in water Keep covered to avoid friction Change bandages daily Prescribed doxycycline take as directed and to completion Continue to alternate ibuprofen and tylenol as needed for pain and fever Follow up with PCP, Joaquin CourtsKimberly Harris FNP, or Wound Care, next week for recheck and ensure your symptoms are improving Call or go to the ED if  you have any new or worsening symptoms such as increased pain, increased redness, increased swelling, discharge, high fever, night sweats, abdominal pain, etc...   Reviewed expectations re: course of current medical issues. Questions answered. Outlined signs and symptoms indicating need for more acute intervention. Patient verbalized understanding. After Visit Summary given.    Joseph [name], Joseph Flanery, PA-C 09/12/18 1609

## 2018-09-20 ENCOUNTER — Emergency Department (HOSPITAL_COMMUNITY): Payer: Medicare Other

## 2018-09-20 ENCOUNTER — Encounter (HOSPITAL_COMMUNITY): Payer: Self-pay | Admitting: Emergency Medicine

## 2018-09-20 ENCOUNTER — Other Ambulatory Visit: Payer: Self-pay

## 2018-09-20 ENCOUNTER — Emergency Department (HOSPITAL_COMMUNITY)
Admission: EM | Admit: 2018-09-20 | Discharge: 2018-09-20 | Disposition: A | Discharge status: Home / Self Care | Payer: Medicare Other | Attending: Emergency Medicine | Admitting: Emergency Medicine

## 2018-09-20 DIAGNOSIS — E119 Type 2 diabetes mellitus without complications: Secondary | ICD-10-CM | POA: Insufficient documentation

## 2018-09-20 DIAGNOSIS — L03115 Cellulitis of right lower limb: Secondary | ICD-10-CM | POA: Insufficient documentation

## 2018-09-20 DIAGNOSIS — Z7984 Long term (current) use of oral hypoglycemic drugs: Secondary | ICD-10-CM | POA: Diagnosis not present

## 2018-09-20 DIAGNOSIS — R2241 Localized swelling, mass and lump, right lower limb: Secondary | ICD-10-CM | POA: Diagnosis not present

## 2018-09-20 DIAGNOSIS — Z79899 Other long term (current) drug therapy: Secondary | ICD-10-CM | POA: Diagnosis not present

## 2018-09-20 DIAGNOSIS — I10 Essential (primary) hypertension: Secondary | ICD-10-CM | POA: Insufficient documentation

## 2018-09-20 DIAGNOSIS — M79671 Pain in right foot: Secondary | ICD-10-CM | POA: Diagnosis present

## 2018-09-20 LAB — CBC WITH DIFFERENTIAL/PLATELET
Abs Immature Granulocytes: 0 10*3/uL (ref 0.00–0.07)
Basophils Absolute: 0 10*3/uL (ref 0.0–0.1)
Basophils Relative: 0 %
Eosinophils Absolute: 0.3 10*3/uL (ref 0.0–0.5)
Eosinophils Relative: 2 %
HCT: 29 % — ABNORMAL LOW (ref 39.0–52.0)
Hemoglobin: 9.5 g/dL — ABNORMAL LOW (ref 13.0–17.0)
Lymphocytes Relative: 31 %
Lymphs Abs: 4.5 10*3/uL — ABNORMAL HIGH (ref 0.7–4.0)
MCH: 32.3 pg (ref 26.0–34.0)
MCHC: 32.8 g/dL (ref 30.0–36.0)
MCV: 98.6 fL (ref 80.0–100.0)
Monocytes Absolute: 0.9 10*3/uL (ref 0.1–1.0)
Monocytes Relative: 6 %
Neutro Abs: 8.8 10*3/uL — ABNORMAL HIGH (ref 1.7–7.7)
Neutrophils Relative %: 61 %
Platelets: 349 10*3/uL (ref 150–400)
RBC: 2.94 MIL/uL — ABNORMAL LOW (ref 4.22–5.81)
RDW: 13.6 % (ref 11.5–15.5)
WBC: 14.5 10*3/uL — ABNORMAL HIGH (ref 4.0–10.5)
nRBC: 0 % (ref 0.0–0.2)
nRBC: 1 /100 WBC — ABNORMAL HIGH

## 2018-09-20 LAB — BASIC METABOLIC PANEL
Anion gap: 8 (ref 5–15)
BUN: 24 mg/dL — ABNORMAL HIGH (ref 8–23)
CO2: 19 mmol/L — ABNORMAL LOW (ref 22–32)
Calcium: 9.2 mg/dL (ref 8.9–10.3)
Chloride: 111 mmol/L (ref 98–111)
Creatinine, Ser: 1.55 mg/dL — ABNORMAL HIGH (ref 0.61–1.24)
GFR calc Af Amer: 52 mL/min — ABNORMAL LOW (ref >60)
GFR calc non Af Amer: 45 mL/min — ABNORMAL LOW (ref >60)
Glucose, Bld: 78 mg/dL (ref 70–99)
Potassium: 6.4 mmol/L (ref 3.5–5.1)
Sodium: 138 mmol/L (ref 135–145)

## 2018-09-20 LAB — POTASSIUM: Potassium: 5.5 mmol/L — ABNORMAL HIGH (ref 3.5–5.1)

## 2018-09-20 IMAGING — CR RIGHT FOOT - 2 VIEW
2 series · 2 of 2 positions shown · non-contrast
Comparison: None.

CLINICAL DATA: 72-year-old male with trauma and foot pain

EXAM:
RIGHT FOOT - 2 VIEW

[foot ap]
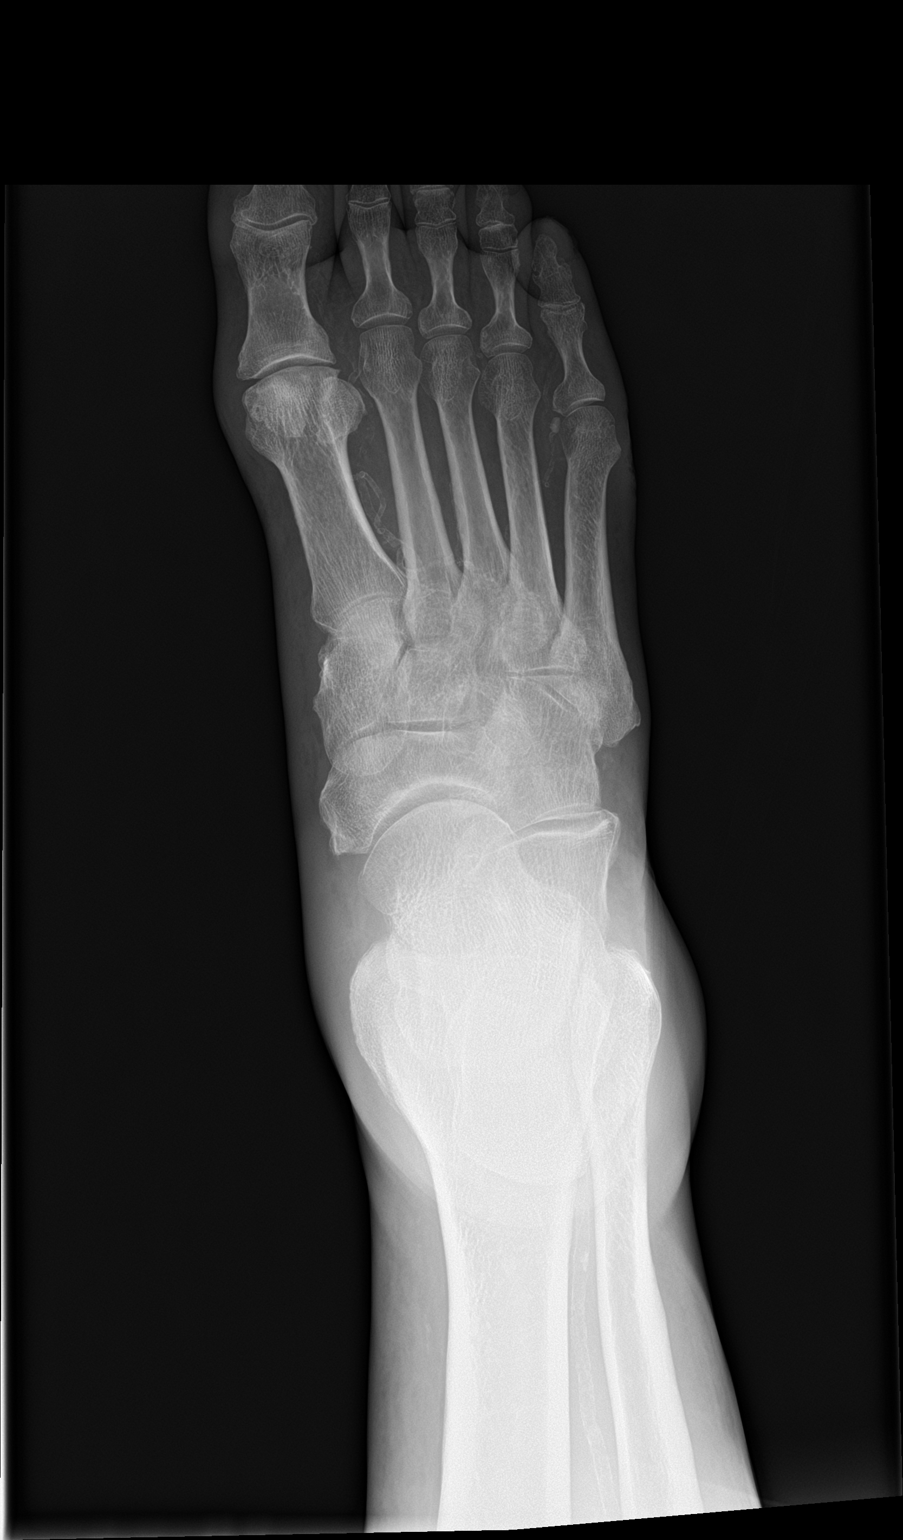

[foot lat]
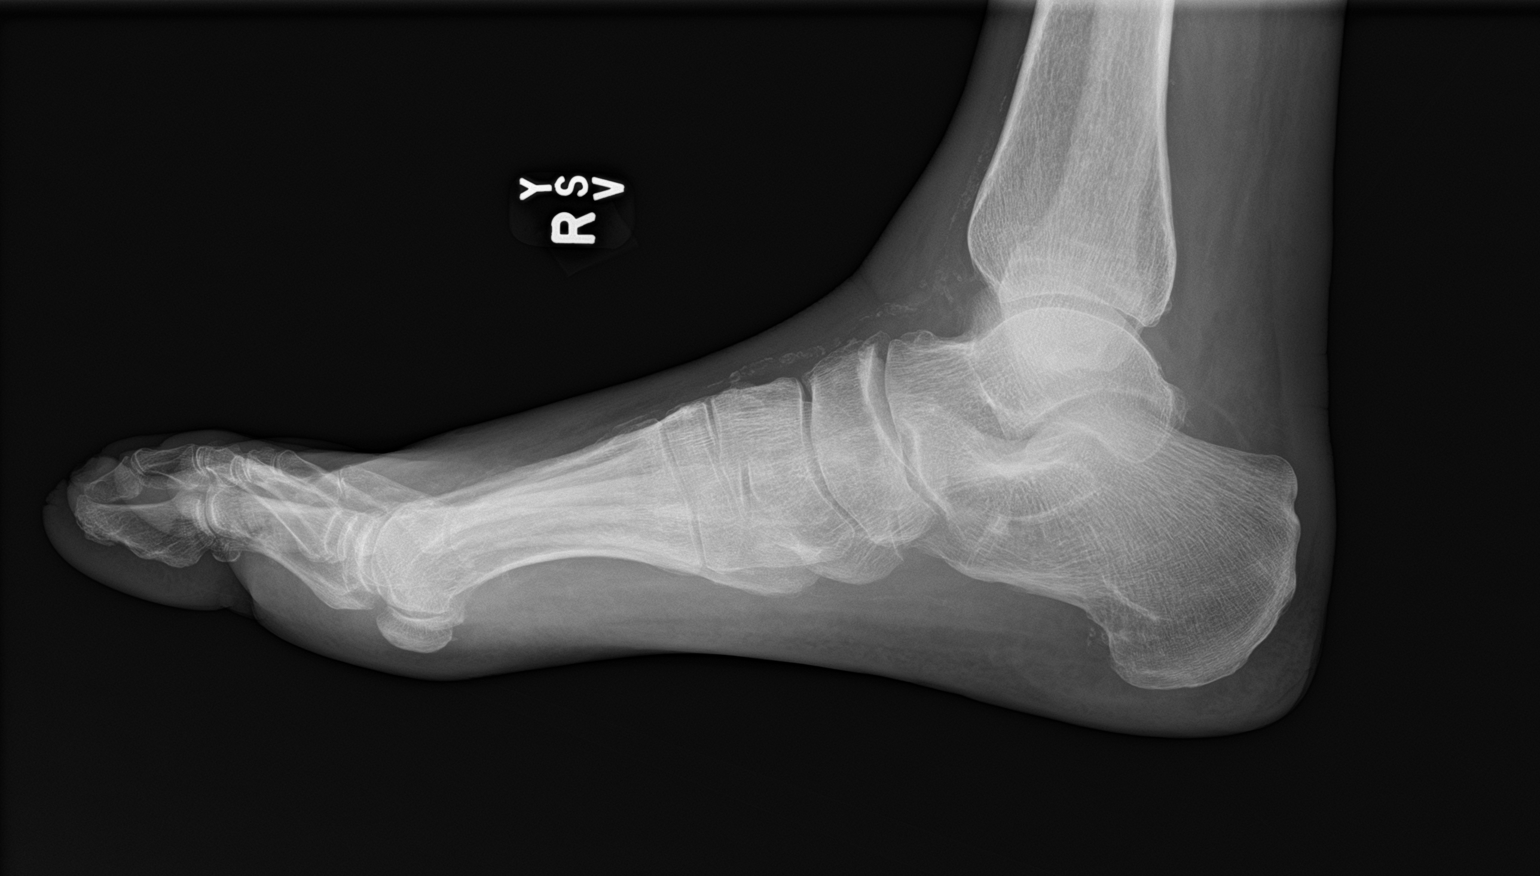

[2 of 2 positions shown; findings below may reference images not displayed]

FINDINGS: No acute displaced fracture. Degenerative changes of the
interphalangeal joints. No subluxation/dislocation. Degenerative
changes of the midfoot. Degenerative changes of the hindfoot. Soft
tissue swelling on the dorsum of the forefoot on the lateral view.

Calcifications of the tibial and pedal vessels. No radiopaque
foreign body.
IMPRESSION: Negative for acute bony abnormality.

Soft tissue swelling on the dorsum of the forefoot.

Advanced tibial and pedal calcifications

## 2018-09-20 MED ORDER — SODIUM CHLORIDE 0.9 % IV BOLUS
1000.0000 mL | Freq: Once | INTRAVENOUS | Status: AC
  Administered 2018-09-20: 1000 mL via INTRAVENOUS

## 2018-09-20 MED ORDER — SODIUM POLYSTYRENE SULFONATE 15 GM/60ML PO SUSP
15.0000 g | Freq: Once | ORAL | Status: AC
  Administered 2018-09-20: 15 g via ORAL
  Filled 2018-09-20: qty 60

## 2018-09-20 MED ORDER — CLINDAMYCIN PHOSPHATE 600 MG/50ML IV SOLN
600.0000 mg | Freq: Once | INTRAVENOUS | Status: AC
  Administered 2018-09-20: 600 mg via INTRAVENOUS
  Filled 2018-09-20: qty 50

## 2018-09-20 MED ORDER — CLINDAMYCIN HCL 150 MG PO CAPS: 150.0000 mg | ORAL_CAPSULE | Freq: Four times a day (QID) | ORAL | 0 refills | Status: DC

## 2018-09-20 NOTE — ED Notes (Signed)
Tube station down, lab never received second potassium draw, this rn sending another redraw at this time

## 2018-09-20 NOTE — ED Triage Notes (Signed)
Rt foot  ankle red and swollen states it has opened back up recently can bear wt and it hurts

## 2018-09-20 NOTE — ED Notes (Signed)
Pt gone to xray

## 2018-09-20 NOTE — Discharge Instructions (Signed)
Follow-up with your primary care doctor in 2 days for wound recheck.  If you cannot get into your primary care doctor please return to the emergency department for recheck.  Return sooner if you have any new or worsening symptoms.  Continue to drink plenty of water and elevate your foot. Thank you for allowing me to care for you today. Please return to the emergency department if you have new or worsening symptoms. Take your medications as instructed.

## 2018-09-20 NOTE — ED Provider Notes (Signed)
[facility] EMERGENCY DEPARTMENT Provider Note   CSN: [id number] Arrival date & time: 09/20/18  1717    History   Chief Complaint Chief Complaint  Patient presents with   Ankle Injury    HPI Joseph [name] is a 73 y.o. male.     HPI He is here for evaluation of injury and pain, of the right foot.  He injured his right foot about 2 weeks ago when a stump dropped onto it, while he was moving it.  He was seen, on 09/12/2018, at which time he was noted to have abrasion on his forefoot with redness and swelling associated, and skin break over the fifth MTP laterally.  The patient describes a blister at the site of the MTP, which seemed to be aggravated by wearing boots.  Patient is taking prescribed doxycycline without improvement.  Continues to have pain in the right foot which is usually when standing but not all the time.  He denies rest pain.  He denies fever, chills, nausea, vomiting, weakness, dizziness, cough, shortness of breath or chest pain.  He has been taking his usual medications.  There are no other known modifying factors.   Past Medical History:  Diagnosis Date   Diabetes mellitus without complication (HCC)    Hypertension     Patient Active Problem List   Diagnosis Date Noted   DM (diabetes mellitus) (HCC) 07/02/2012   HTN (hypertension) 07/02/2012    Past Surgical History:  Procedure Laterality Date   HAND SURGERY          Home Medications    Prior to Admission medications   Medication Sig Start Date End Date Taking? Authorizing Provider  Aspirin-Caffeine (BC FAST PAIN RELIEF ARTHRITIS PO) Take 2 Packages by mouth as needed (arthritis pain).    [provider]  doxycycline (VIBRAMYCIN) 100 MG capsule Take 1 capsule (100 mg total) by mouth 2 (two) times daily. 09/12/18   Wurst, [place], PA-C  glipiZIDE (GLUCOTROL) 5 MG tablet Take 1 tablet (5 mg total) by mouth daily before breakfast. 05/08/18   Eustace Moore, MD    lisinopril (PRINIVIL,ZESTRIL) 20 MG tablet Take 1 tablet (20 mg total) by mouth daily. 05/08/18   Eustace Moore, MD  metFORMIN (GLUCOPHAGE) 1000 MG tablet Take 1 tablet (1,000 mg total) by mouth 2 (two) times daily. 05/08/18   Eustace Moore, MD  Multiple Vitamins-Minerals (MULTIVITAMIN ADULT PO) Take 1 tablet by mouth daily.    [provider]    Family History Family History  Family history unknown: Yes    Social History Social History   Tobacco Use   Smoking status: Never Smoker   Smokeless tobacco: Never Used  Substance Use Topics   Alcohol use: Yes   Drug use: No     Allergies   Patient has no known allergies.   Review of Systems Review of Systems  All other systems reviewed and are negative.    Physical Exam Updated Vital Signs BP (!) 161/86 (BP Location: Right Arm)    Pulse 73    Temp 98.6 F (37 C) (Oral)    Resp 20    SpO2 100%   Physical Exam Vitals signs and nursing note reviewed.  Constitutional:      General: He is not in acute distress.    Appearance: Normal appearance. He is well-developed. He is not ill-appearing, toxic-appearing or diaphoretic.  HENT:     Head: Normocephalic and atraumatic.     Right Ear: External  ear normal.     Left Ear: External ear normal.  Eyes:     Conjunctiva/sclera: Conjunctivae normal.     Pupils: Pupils are equal, round, and reactive to light.  Neck:     Musculoskeletal: Normal range of motion and neck supple.     Trachea: Phonation normal.  Cardiovascular:     Rate and Rhythm: Normal rate.  Pulmonary:     Effort: Pulmonary effort is normal.  Musculoskeletal:     Comments: Right foot mild swelling of the forefoot with redness.  Apparent healing abrasion dorsally over the third MTP region.  Ulceration of the lateral fifth MTP region, about 2 cm x 0.2 cm deep.  The area around this ulcer has mild subacute appearing swelling, with whitish discoloration.  Redness is plantar forefoot and does not  spread proximally.  He has good range of motion of the right ankle somewhat limited range of motion of the right foot secondary to pain.  He is neurovascular distally in the toes of the right foot.  Skin:    General: Skin is warm and dry.  Neurological:     Mental Status: He is alert and oriented to person, place, and time.     Cranial Nerves: No cranial nerve deficit.     Sensory: No sensory deficit.     Motor: No abnormal muscle tone.     Coordination: Coordination normal.  Psychiatric:        Mood and Affect: Mood normal.        Behavior: Behavior normal.        Thought Content: Thought content normal.        Judgment: Judgment normal.       ED Treatments / Results  Labs (all labs ordered are listed, but only abnormal results are displayed) Labs Reviewed  CBC WITH DIFFERENTIAL/PLATELET - Abnormal; Notable for the following components:      Result Value   WBC 14.5 (*)    RBC 2.94 (*)    Hemoglobin 9.5 (*)    HCT 29.0 (*)    All other components within normal limits  BASIC METABOLIC PANEL    EKG None  Radiology Dg Foot 2 Views Right  Result Date: 09/20/2018 CLINICAL DATA:  73 year old male with trauma and foot pain EXAM: RIGHT FOOT - 2 VIEW COMPARISON:  None. FINDINGS: No acute displaced fracture. Degenerative changes of the interphalangeal joints. No subluxation/dislocation. Degenerative changes of the midfoot. Degenerative changes of the hindfoot. Soft tissue swelling on the dorsum of the forefoot on the lateral view. Calcifications of the tibial and pedal vessels. No radiopaque foreign body. IMPRESSION: Negative for acute bony abnormality. Soft tissue swelling on the dorsum of the forefoot. Advanced tibial and pedal calcifications Electronically Signed   By: Gilmer MorJaime  Wagner D.O.   On: 09/20/2018 18:28    Procedures Procedures (including critical care time)  Medications Ordered in ED Medications  clindamycin (CLEOCIN) IVPB 600 mg (600 mg Intravenous New Bag/Given  09/20/18 1844)  sodium chloride 0.9 % bolus 1,000 mL (1,000 mLs Intravenous New Bag/Given 09/20/18 1843)     Initial Impression / Assessment and Plan / ED Course  I have reviewed the triage vital signs and the nursing notes.  Pertinent labs & imaging results that were available during my care of the patient were reviewed by me and considered in my medical decision making (see chart for details).  Clinical Course as of Sep 19 1898  Thu Sep 20, 2018  1854 Plan to start clinda and  f/u in 2 days for wound check.    [KM]    Clinical Course User Index [KM] Jeral Pinch       *  Patient Vitals for the past 24 hrs:  BP Temp Temp src Pulse Resp SpO2  09/20/18 1727 (!) 161/86 98.6 F (37 C) Oral 73 20 100 %    6:45 PM Reevaluation with update and discussion. After initial assessment and treatment, an updated evaluation reveals implants comfortable has no additional complaints, findings discussed with him at this time.  Plans discussed.  Questions answered. Mancel Bale   Medical Decision Making: Right foot injury with abrasion, blister, with superficial ulcer.  Symptoms progressed while being treated with doxycycline.  Patient changed to clindamycin, with IV dosing here.  X-ray does not indicate fracture, foreign body or osteomyelitis.  Doubt metabolic or hemodynamic instability.  CRITICAL CARE-no Performed by: Mancel Bale  Nursing Notes Reviewed/ Care Coordinated Applicable Imaging Reviewed Interpretation of Laboratory Data incorporated into ED treatment  Disposition-as per oncoming provider team after completion of evaluation and treatment.   Final Clinical Impressions(s) / ED Diagnoses   Final diagnoses:  Cellulitis of right foot    ED Discharge Orders    None       Mancel Bale, MD 09/20/18 1901

## 2018-09-23 ENCOUNTER — Encounter (HOSPITAL_COMMUNITY): Payer: Self-pay | Admitting: *Deleted

## 2018-09-23 ENCOUNTER — Emergency Department (HOSPITAL_COMMUNITY)
Admission: EM | Admit: 2018-09-23 | Discharge: 2018-09-23 | Disposition: A | Discharge status: Home / Self Care | Payer: Medicare Other | Attending: Emergency Medicine | Admitting: Emergency Medicine

## 2018-09-23 ENCOUNTER — Other Ambulatory Visit: Payer: Self-pay

## 2018-09-23 DIAGNOSIS — L089 Local infection of the skin and subcutaneous tissue, unspecified: Secondary | ICD-10-CM | POA: Diagnosis not present

## 2018-09-23 DIAGNOSIS — E119 Type 2 diabetes mellitus without complications: Secondary | ICD-10-CM | POA: Insufficient documentation

## 2018-09-23 DIAGNOSIS — L03115 Cellulitis of right lower limb: Secondary | ICD-10-CM | POA: Insufficient documentation

## 2018-09-23 DIAGNOSIS — Z5189 Encounter for other specified aftercare: Secondary | ICD-10-CM

## 2018-09-23 DIAGNOSIS — Z79899 Other long term (current) drug therapy: Secondary | ICD-10-CM | POA: Diagnosis not present

## 2018-09-23 DIAGNOSIS — I10 Essential (primary) hypertension: Secondary | ICD-10-CM | POA: Insufficient documentation

## 2018-09-23 DIAGNOSIS — Z7984 Long term (current) use of oral hypoglycemic drugs: Secondary | ICD-10-CM | POA: Diagnosis not present

## 2018-09-23 DIAGNOSIS — M79671 Pain in right foot: Secondary | ICD-10-CM | POA: Diagnosis not present

## 2018-09-23 NOTE — ED Notes (Signed)
Dr Rodena Medin in w/pt.

## 2018-09-23 NOTE — ED Notes (Signed)
Patient verbalizes understanding of discharge instructions . Opportunity for questions and answers were provided . Armband removed by staff ,Pt discharged from ED. W/C  offered at D/C  and Declined W/C at D/C and was escorted to lobby by RN.  

## 2018-09-23 NOTE — ED Triage Notes (Signed)
PT returns for wound check on RT foot. Skin red swollen with open area.

## 2018-09-23 NOTE — Discharge Instructions (Addendum)
Please return for any problem.  Follow-up with your regular care provider as instructed. °

## 2018-09-23 NOTE — ED Provider Notes (Signed)
[facility] EMERGENCY DEPARTMENT Provider Note   CSN: [id number] Arrival date & time: 09/23/18  1723    History   Chief Complaint Chief Complaint  Patient presents with  . Wound Check    HPI Joseph [name] is a 73 y.o. male.     73 years old male with prior medical history as detailed below presents for evaluation of his right foot.  Patient was seen 3 days previously at the same facility for a right foot infection.  He had been on doxycycline as an outpatient.  As of 3 days ago he was changed to clindamycin.  He reports that his right foot is improved.  He returns today primarily for a wound check.  He reports less pain in the right foot.  He denies fever.  He reports that his sugars have been well controlled.   Of note, patient is seen today by the same nurse who saw him on his last visit. She agrees that his foot does appear to be improving.  The history is provided by the patient and medical records.  Wound Check  This is a new problem. The current episode started more than 2 days ago. The problem occurs constantly. The problem has not changed since onset.Pertinent negatives include no chest pain, no abdominal pain, no headaches and no shortness of breath. Nothing aggravates the symptoms. Nothing relieves the symptoms.    Past Medical History:  Diagnosis Date  . Diabetes mellitus without complication (HCC)   . Hypertension     Patient Active Problem List   Diagnosis Date Noted  . DM (diabetes mellitus) (HCC) 07/02/2012  . HTN (hypertension) 07/02/2012    Past Surgical History:  Procedure Laterality Date  . HAND SURGERY          Home Medications    Prior to Admission medications   Medication Sig Start Date End Date Taking? Authorizing Provider  Aspirin-Caffeine (BC FAST PAIN RELIEF ARTHRITIS PO) Take 2 Packages by mouth as needed (arthritis pain).    [provider]  clindamycin (CLEOCIN) 150 MG capsule Take 1 capsule (150 mg total) by  mouth every 6 (six) hours. 09/20/18   Arlyn Dunning, PA-C  doxycycline (VIBRAMYCIN) 100 MG capsule Take 1 capsule (100 mg total) by mouth 2 (two) times daily. 09/12/18   Wurst, [place], PA-C  glipiZIDE (GLUCOTROL) 5 MG tablet Take 1 tablet (5 mg total) by mouth daily before breakfast. 05/08/18   Eustace Moore, MD  lisinopril (PRINIVIL,ZESTRIL) 20 MG tablet Take 1 tablet (20 mg total) by mouth daily. 05/08/18   Eustace Moore, MD  metFORMIN (GLUCOPHAGE) 1000 MG tablet Take 1 tablet (1,000 mg total) by mouth 2 (two) times daily. 05/08/18   Eustace Moore, MD  Multiple Vitamins-Minerals (MULTIVITAMIN ADULT PO) Take 1 tablet by mouth daily.    [provider]    Family History Family History  Family history unknown: Yes    Social History Social History   Tobacco Use  . Smoking status: Never Smoker  . Smokeless tobacco: Never Used  Substance Use Topics  . Alcohol use: Yes  . Drug use: No     Allergies   Patient has no known allergies.   Review of Systems Review of Systems  Respiratory: Negative for shortness of breath.   Cardiovascular: Negative for chest pain.  Gastrointestinal: Negative for abdominal pain.  Neurological: Negative for headaches.  All other systems reviewed and are negative.    Physical Exam Updated Vital Signs BP Marland Kitchen)  162/83 (BP Location: Right Arm)   Pulse 69   Temp 98.3 F (36.8 C) (Oral)   Resp 20   SpO2 100%   Physical Exam Vitals signs and nursing note reviewed.  Constitutional:      General: He is not in acute distress.    Appearance: He is well-developed.  HENT:     Head: Normocephalic and atraumatic.  Eyes:     Conjunctiva/sclera: Conjunctivae normal.     Pupils: Pupils are equal, round, and reactive to light.  Neck:     Musculoskeletal: Normal range of motion and neck supple.  Cardiovascular:     Rate and Rhythm: Normal rate and regular rhythm.     Heart sounds: Normal heart sounds.  Pulmonary:     Effort:  Pulmonary effort is normal. No respiratory distress.     Breath sounds: Normal breath sounds.  Abdominal:     General: There is no distension.     Palpations: Abdomen is soft.     Tenderness: There is no abdominal tenderness.  Musculoskeletal: Normal range of motion.        General: No deformity.  Skin:    General: Skin is warm and dry.     Comments: Mild erythema and cellulitis to right lateral foot.  No erythema proximal to the right ankle.  Neurological:     General: No focal deficit present.     Mental Status: He is alert and oriented to person, place, and time. Mental status is at baseline.      ED Treatments / Results  Labs (all labs ordered are listed, but only abnormal results are displayed) Labs Reviewed - No data to display  EKG None  Radiology No results found.  Procedures Procedures (including critical care time)  Medications Ordered in ED Medications - No data to display   Initial Impression / Assessment and Plan / ED Course  I have reviewed the triage vital signs and the nursing notes.  Pertinent labs & imaging results that were available during my care of the patient were reviewed by me and considered in my medical decision making (see chart for details).        MDM  Screen complete  Joseph [name] was evaluated in Emergency Department on 09/23/2018 for the symptoms described in the history of present illness. He was evaluated in the context of the global COVID-19 pandemic, which necessitated consideration that the patient might be at risk for infection with the SARS-CoV-2 virus that causes COVID-19. Institutional protocols and algorithms that pertain to the evaluation of patients at risk for COVID-19 are in a state of rapid change based on information released by regulatory bodies including the CDC and federal and state organizations. These policies and algorithms were followed during the patient's care in the ED.  Patient is presenting to for a wound  check of the right foot.  Patient is currently is on clindamycin for treatment.  Patient reports that he feels improved. He declines additional workup or treatment at this time. He is advised to closely follow up a regular care provider and/or podiatry.   Strict return precautions give and understood.   Final Clinical Impressions(s) / ED Diagnoses   Final diagnoses:  Visit for wound check    ED Discharge Orders    None       Wynetta FinesMessick, Jakaria Lavergne C, MD 09/23/18 [phone]

## 2018-10-08 ENCOUNTER — Encounter (HOSPITAL_COMMUNITY): Payer: Self-pay

## 2018-10-08 ENCOUNTER — Other Ambulatory Visit: Payer: Self-pay

## 2018-10-08 ENCOUNTER — Emergency Department (HOSPITAL_COMMUNITY)
Admission: EM | Admit: 2018-10-08 | Discharge: 2018-10-09 | Disposition: A | Discharge status: Home / Self Care | Payer: Medicare Other | Attending: Emergency Medicine | Admitting: Emergency Medicine

## 2018-10-08 DIAGNOSIS — I10 Essential (primary) hypertension: Secondary | ICD-10-CM | POA: Insufficient documentation

## 2018-10-08 DIAGNOSIS — X58XXXD Exposure to other specified factors, subsequent encounter: Secondary | ICD-10-CM | POA: Insufficient documentation

## 2018-10-08 DIAGNOSIS — Z79899 Other long term (current) drug therapy: Secondary | ICD-10-CM | POA: Insufficient documentation

## 2018-10-08 DIAGNOSIS — Z4800 Encounter for change or removal of nonsurgical wound dressing: Secondary | ICD-10-CM | POA: Diagnosis present

## 2018-10-08 DIAGNOSIS — Y929 Unspecified place or not applicable: Secondary | ICD-10-CM | POA: Insufficient documentation

## 2018-10-08 DIAGNOSIS — Y939 Activity, unspecified: Secondary | ICD-10-CM | POA: Insufficient documentation

## 2018-10-08 DIAGNOSIS — S91309A Unspecified open wound, unspecified foot, initial encounter: Secondary | ICD-10-CM

## 2018-10-08 DIAGNOSIS — S91302D Unspecified open wound, left foot, subsequent encounter: Secondary | ICD-10-CM | POA: Diagnosis not present

## 2018-10-08 DIAGNOSIS — Z7984 Long term (current) use of oral hypoglycemic drugs: Secondary | ICD-10-CM | POA: Diagnosis not present

## 2018-10-08 DIAGNOSIS — E119 Type 2 diabetes mellitus without complications: Secondary | ICD-10-CM | POA: Diagnosis not present

## 2018-10-08 DIAGNOSIS — Y999 Unspecified external cause status: Secondary | ICD-10-CM | POA: Diagnosis not present

## 2018-10-08 DIAGNOSIS — S91301A Unspecified open wound, right foot, initial encounter: Secondary | ICD-10-CM | POA: Diagnosis not present

## 2018-10-08 DIAGNOSIS — D72820 Lymphocytosis (symptomatic): Secondary | ICD-10-CM | POA: Diagnosis not present

## 2018-10-08 LAB — CBC WITH DIFFERENTIAL/PLATELET
Abs Immature Granulocytes: 0.04 10*3/uL (ref 0.00–0.07)
Basophils Absolute: 0.1 10*3/uL (ref 0.0–0.1)
Basophils Relative: 1 %
Eosinophils Absolute: 0.7 10*3/uL — ABNORMAL HIGH (ref 0.0–0.5)
Eosinophils Relative: 5 %
HCT: 30.8 % — ABNORMAL LOW (ref 39.0–52.0)
Hemoglobin: 9.6 g/dL — ABNORMAL LOW (ref 13.0–17.0)
Immature Granulocytes: 0 %
Lymphocytes Relative: 40 %
Lymphs Abs: 5.6 10*3/uL — ABNORMAL HIGH (ref 0.7–4.0)
MCH: 30.9 pg (ref 26.0–34.0)
MCHC: 31.2 g/dL (ref 30.0–36.0)
MCV: 99 fL (ref 80.0–100.0)
Monocytes Absolute: 1 10*3/uL (ref 0.1–1.0)
Monocytes Relative: 7 %
Neutro Abs: 6.7 10*3/uL (ref 1.7–7.7)
Neutrophils Relative %: 47 %
Platelets: 316 10*3/uL (ref 150–400)
RBC: 3.11 MIL/uL — ABNORMAL LOW (ref 4.22–5.81)
RDW: 13.7 % (ref 11.5–15.5)
WBC: 14 10*3/uL — ABNORMAL HIGH (ref 4.0–10.5)
nRBC: 0 % (ref 0.0–0.2)

## 2018-10-08 LAB — COMPREHENSIVE METABOLIC PANEL
ALT: 15 U/L (ref 0–44)
AST: 27 U/L (ref 15–41)
Albumin: 3.4 g/dL — ABNORMAL LOW (ref 3.5–5.0)
Alkaline Phosphatase: 35 U/L — ABNORMAL LOW (ref 38–126)
Anion gap: 9 (ref 5–15)
BUN: 18 mg/dL (ref 8–23)
CO2: 21 mmol/L — ABNORMAL LOW (ref 22–32)
Calcium: 9.1 mg/dL (ref 8.9–10.3)
Chloride: 108 mmol/L (ref 98–111)
Creatinine, Ser: 1.59 mg/dL — ABNORMAL HIGH (ref 0.61–1.24)
GFR calc Af Amer: 50 mL/min — ABNORMAL LOW (ref >60)
GFR calc non Af Amer: 43 mL/min — ABNORMAL LOW (ref >60)
Glucose, Bld: 64 mg/dL — ABNORMAL LOW (ref 70–99)
Potassium: 3.9 mmol/L (ref 3.5–5.1)
Sodium: 138 mmol/L (ref 135–145)
Total Bilirubin: 0.4 mg/dL (ref 0.3–1.2)
Total Protein: 7.2 g/dL (ref 6.5–8.1)

## 2018-10-08 LAB — LACTIC ACID, PLASMA: Lactic Acid, Venous: 1.4 mmol/L (ref 0.5–1.9)

## 2018-10-08 MED ORDER — DOXYCYCLINE HYCLATE 100 MG PO TABS
100.0000 mg | ORAL_TABLET | Freq: Once | ORAL | Status: AC
  Administered 2018-10-09: 100 mg via ORAL
  Filled 2018-10-08: qty 1

## 2018-10-08 MED ORDER — ACETAMINOPHEN 325 MG PO TABS
650.0000 mg | ORAL_TABLET | Freq: Once | ORAL | Status: AC
  Administered 2018-10-09: 650 mg via ORAL
  Filled 2018-10-08: qty 2

## 2018-10-08 MED ORDER — DOXYCYCLINE HYCLATE 100 MG PO TABS: 100.0000 mg | ORAL_TABLET | Freq: Once | ORAL | Status: DC

## 2018-10-08 MED ORDER — HYDROCODONE-ACETAMINOPHEN 5-325 MG PO TABS: 1.0000 | ORAL_TABLET | Freq: Four times a day (QID) | ORAL | 0 refills | Status: DC | PRN

## 2018-10-08 MED ORDER — SODIUM CHLORIDE 0.9% FLUSH: 3.0000 mL | Freq: Once | INTRAVENOUS | Status: DC

## 2018-10-08 MED ORDER — AMOXICILLIN-POT CLAVULANATE 875-125 MG PO TABS: 1.0000 | ORAL_TABLET | Freq: Two times a day (BID) | ORAL | 0 refills | Status: DC

## 2018-10-08 NOTE — ED Triage Notes (Signed)
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 Necrotic in the center, redness surrounding.

## 2018-10-08 NOTE — ED Provider Notes (Signed)
[facility] EMERGENCY DEPARTMENT Provider Note   CSN: [id number] Arrival date & time: 10/08/18  2014    History   Chief Complaint Chief Complaint  Patient presents with  . Wound Check    HPI Joseph [name] is a 73 y.o. male.     The history is provided by the patient.  Wound Check  This is a recurrent problem. The current episode started more than 1 week ago. The problem occurs daily. The problem has been gradually improving. The symptoms are aggravated by walking. The symptoms are relieved by rest.   Patient presents for wound on right foot He reports he has had this for weeks He has been seen in urgent care/ED for this and has been on multiple antibiotics (doxycycline, clindamycin) He reports it is actually improving but wanted a "Recheck" No new injury No fever/vomiting He does not have a PCP Past Medical History:  Diagnosis Date  . Diabetes mellitus without complication (HCC)   . Hypertension     Patient Active Problem List   Diagnosis Date Noted  . DM (diabetes mellitus) (HCC) 07/02/2012  . HTN (hypertension) 07/02/2012    Past Surgical History:  Procedure Laterality Date  . HAND SURGERY          Home Medications    Prior to Admission medications   Medication Sig Start Date End Date Taking? Authorizing Provider  amoxicillin-clavulanate (AUGMENTIN) 875-125 MG tablet Take 1 tablet by mouth 2 (two) times daily. One po bid x 7 days 10/08/18   Zadie Rhine, MD  Aspirin-Caffeine [facility] FAST PAIN RELIEF ARTHRITIS PO) Take 2 Packages by mouth as needed (arthritis pain).    [provider]  glipiZIDE (GLUCOTROL) 5 MG tablet Take 1 tablet (5 mg total) by mouth daily before breakfast. 05/08/18   Eustace Moore, MD  HYDROcodone-acetaminophen (NORCO/VICODIN) 5-325 MG tablet Take 1 tablet by mouth every 6 (six) hours as needed for severe pain. 10/08/18   Zadie Rhine, MD  lisinopril (PRINIVIL,ZESTRIL) 20 MG tablet Take 1 tablet (20 mg  total) by mouth daily. 05/08/18   Eustace Moore, MD  metFORMIN (GLUCOPHAGE) 1000 MG tablet Take 1 tablet (1,000 mg total) by mouth 2 (two) times daily. 05/08/18   Eustace Moore, MD  Multiple Vitamins-Minerals (MULTIVITAMIN ADULT PO) Take 1 tablet by mouth daily.    [provider]    Family History Family History  Family history unknown: Yes    Social History Social History   Tobacco Use  . Smoking status: Never Smoker  . Smokeless tobacco: Never Used  Substance Use Topics  . Alcohol use: Yes  . Drug use: No     Allergies   Patient has no known allergies.   Review of Systems Review of Systems  Constitutional: Negative for chills and fever.  Gastrointestinal: Negative for vomiting.  Skin: Positive for wound.  All other systems reviewed and are negative.    Physical Exam Updated Vital Signs BP (!) 148/74   Pulse 73   Temp 98.2 F (36.8 C) (Oral)   Resp 18   SpO2 100%   Physical Exam CONSTITUTIONAL: Well developed/well nourished HEAD: Normocephalic/atraumatic EYES: EOMI/PERRL ENMT: Mucous membranes moist NECK: supple no meningeal signs SPINE/BACK:entire spine nontender CV: S1/S2 noted, no murmurs/rubs/gallops noted LUNGS: Lungs are clear to auscultation bilaterally, no apparent distress ABDOMEN: soft, nontender, no rebound or guarding NEURO: Pt is awake/alert/appropriate, moves all extremitiesx4.  No facial droop.   EXTREMITIES:  full ROM, mild erythema noted to foot, necrotic  center noted, no drainage, no streaking. No crepitus.  No wounds on plantar surface.  No wounds in webspaces See photo below SKIN: warm, color normal PSYCH: no abnormalities of mood noted, alert and oriented to situation     ED Treatments / Results  Labs (all labs ordered are listed, but only abnormal results are displayed) Labs Reviewed  COMPREHENSIVE METABOLIC PANEL - Abnormal; Notable for the following components:      Result Value   CO2 21 (*)    Glucose,  Bld 64 (*)    Creatinine, Ser 1.59 (*)    Albumin 3.4 (*)    Alkaline Phosphatase 35 (*)    GFR calc non Af Amer 43 (*)    GFR calc Af Amer 50 (*)    All other components within normal limits  CBC WITH DIFFERENTIAL/PLATELET - Abnormal; Notable for the following components:   WBC 14.0 (*)    RBC 3.11 (*)    Hemoglobin 9.6 (*)    HCT 30.8 (*)    Lymphs Abs 5.6 (*)    Eosinophils Absolute 0.7 (*)    All other components within normal limits  LACTIC ACID, PLASMA    EKG None  Radiology No results found.  Procedures Procedures (including critical care time)  Medications Ordered in ED Medications  sodium chloride flush (NS) 0.9 % injection 3 mL (has no administration in time range)  acetaminophen (TYLENOL) tablet 650 mg (has no administration in time range)  doxycycline (VIBRA-TABS) tablet 100 mg (has no administration in time range)     Initial Impression / Assessment and Plan / ED Course  I have reviewed the triage vital signs and the nursing notes.  Pertinent labs  results that were available during my care of the patient were reviewed by me and considered in my medical decision making (see chart for details).        11:40 PM Presents for a wound he has had on his foot for several weeks.  He is has been on multiple rounds of antibiotics.  He admits that it is actually getting improved.  There does appear to be some chronicity to this wound.  He denies any fevers or chills. He is a diabetic. He has not been on doxycycline and clindamycin.  These were for visits in April. We will place him on Augmentin, have referred to wound center.  Case management social work consult is also placed Pulses noted by doppler  Will d/c home No signs of sepsis This wound has been chronic, does not require admit at this time  Final Clinical Impressions(s) / ED Diagnoses   Final diagnoses:  Wound of foot    ED Discharge Orders         Ordered    amoxicillin-clavulanate (AUGMENTIN)  875-125 MG tablet  2 times daily     10/08/18 2331    HYDROcodone-acetaminophen (NORCO/VICODIN) 5-325 MG tablet  Every 6 hours PRN     10/08/18 2333           Zadie RhineWickline, Christyann Manolis, MD 10/09/18 0128

## 2018-10-08 NOTE — Progress Notes (Deleted)
   Subjective:    Patient ID: Joseph [name], male    DOB: [birth year], 73 y.o.   MRN: [id number]  Joseph Zeiders was evaluated in Emergency Department on 09/23/2018 for the symptoms described in the history of present illness. He was evaluated in the context of the global COVID-19 pandemic, which necessitated consideration that the patient might be at risk for infection with the SARS-CoV-2 virus that causes COVID-19. Institutional protocols and algorithms that pertain to the evaluation of patients at risk for COVID-19 are in a state of rapid change based on information released by regulatory bodies including the CDC and federal and state organizations. These policies and algorithms were followed during the patient's care in the ED.  Patient is presenting to for a wound check of the right foot.  Patient is currently is on clindamycin for treatment.  Patient reports that he feels improved. He declines additional workup or treatment at this time. He is advised to closely follow up a regular care provider and/or podiatry.   Also in ED 5/11     Review of Systems     Objective:   Physical Exam        Assessment & Plan:

## 2018-10-09 ENCOUNTER — Inpatient Hospital Stay: Payer: Medicare Other | Admitting: Critical Care Medicine

## 2018-10-09 LAB — PATHOLOGIST SMEAR REVIEW

## 2018-10-09 NOTE — Discharge Instructions (Signed)
Please follow-up with the wound center.  Please keep the wound clean and dry.  Please take your antibiotics.

## 2018-10-09 NOTE — ED Notes (Addendum)
RN placed bandage on Right foot wound. Tefla, 4x4, and gauze wrapping around the foot. Pt tolerated well.

## 2018-10-09 NOTE — ED Notes (Addendum)
RN utilized doppler to auscultate RLE pulses. RN heard good dorsalis ped and posterior tib pulses upon auscultation.

## 2018-10-17 ENCOUNTER — Ambulatory Visit: Payer: Medicare Other | Attending: Family Medicine | Admitting: Physician Assistant

## 2018-10-17 ENCOUNTER — Other Ambulatory Visit: Payer: Self-pay

## 2018-10-17 VITALS — BP 170/99 | HR 69 | Temp 97.6°F | Wt 226.0 lb

## 2018-10-17 DIAGNOSIS — I10 Essential (primary) hypertension: Secondary | ICD-10-CM

## 2018-10-17 DIAGNOSIS — Z7984 Long term (current) use of oral hypoglycemic drugs: Secondary | ICD-10-CM | POA: Diagnosis not present

## 2018-10-17 DIAGNOSIS — Z7982 Long term (current) use of aspirin: Secondary | ICD-10-CM | POA: Diagnosis not present

## 2018-10-17 DIAGNOSIS — S91301A Unspecified open wound, right foot, initial encounter: Secondary | ICD-10-CM

## 2018-10-17 DIAGNOSIS — E1165 Type 2 diabetes mellitus with hyperglycemia: Secondary | ICD-10-CM

## 2018-10-17 DIAGNOSIS — Z79899 Other long term (current) drug therapy: Secondary | ICD-10-CM | POA: Insufficient documentation

## 2018-10-17 DIAGNOSIS — Z09 Encounter for follow-up examination after completed treatment for conditions other than malignant neoplasm: Secondary | ICD-10-CM

## 2018-10-17 LAB — GLUCOSE, POCT (MANUAL RESULT ENTRY): POC Glucose: 73 mg/dl (ref 70–99)

## 2018-10-17 LAB — POCT GLYCOSYLATED HEMOGLOBIN (HGB A1C): HbA1c, POC (prediabetic range): 5.7 % (ref 5.7–6.4)

## 2018-10-17 MED ORDER — METFORMIN HCL 1000 MG PO TABS: 1000.0000 mg | ORAL_TABLET | Freq: Two times a day (BID) | ORAL | 1 refills | Status: DC

## 2018-10-17 MED ORDER — MUPIROCIN 2 % EX OINT: TOPICAL_OINTMENT | CUTANEOUS | 0 refills | Status: DC

## 2018-10-17 MED ORDER — LISINOPRIL-HYDROCHLOROTHIAZIDE 20-12.5 MG PO TABS: 1.0000 | ORAL_TABLET | Freq: Every day | ORAL | 3 refills | Status: DC

## 2018-10-17 MED ORDER — GLIPIZIDE 5 MG PO TABS: 5.0000 mg | ORAL_TABLET | Freq: Every day | ORAL | 0 refills | Status: DC

## 2018-10-17 MED FILL — metFORMIN HCL 1000 MG TABS: 1000 | 30 days supply | Qty: 60 | Fill #0

## 2018-10-17 MED FILL — LISINOPRIL-HCTZ 20-12.5 MG: 20-12.5 | 30 days supply | Qty: 30 | Fill #0

## 2018-10-17 MED FILL — MUPIROCIN 2% OINTMENT: 2 | 10 days supply | Qty: 22 | Fill #0

## 2018-10-17 MED FILL — glipiZIDE 5 MG TABS: 5 | 30 days supply | Qty: 30 | Fill #0

## 2018-10-17 NOTE — Progress Notes (Signed)
Patient ID: Joseph Cunning, male   DOB: [birth year], 73 y.o.   MRN: [id number]      Joseph Longwell, is a 73 y.o. male  FGH:[id number]  [id number]:[id number]  DOB - 1946/02/12  Subjective:  Chief Complaint and HPI: Joseph [name] is a 73 y.o. male here today to establish care and for a follow up visit After being seen multiple times in the ED/UC for a wound on his R foot.  Most recently seen 10/08/2018.  He feels much less pain with his foot and feels it is improving.  No fever.    Blood sugars have been good.  BP has been up.  Denies HA/CP/dizziness  From ED note:  Wound Check  This is a recurrent problem. The current episode started more than 1 week ago. The problem occurs daily. The problem has been gradually improving. The symptoms are aggravated by walking. The symptoms are relieved by rest.   Patient presents for wound on right foot He reports he has had this for weeks He has been seen in urgent care/ED for this and has been on multiple antibiotics (doxycycline, clindamycin) He reports it is actually improving but wanted a "Recheck" No new injury No fever/vomiting  From A/P: We will place him on Augmentin, have referred to wound center.  Case management social work consult is also placed Pulses noted by doppler  Will d/c home No signs of sepsis This wound has been chronic, does not require admit at this time  ED/Hospital notes reviewed.    ROS:   Constitutional:  No f/c, No night sweats, No unexplained weight loss. EENT:  No vision changes, No blurry vision, No hearing changes. No mouth, throat, or ear problems.  Respiratory: No cough, No SOB Cardiac: No CP, no palpitations GI:  No abd pain, No N/V/D. GU: No Urinary s/sx Musculoskeletal: No joint pain Neuro: No headache, no dizziness, no motor weakness.  Skin: No rash Endocrine:  No polydipsia. No polyuria.  Psych: Denies SI/HI  Problem  'Light-for-Dates' Infant With Signs of Fetal Malnutrition    ALLERGIES: No Known  Allergies  PAST MEDICAL HISTORY: Past Medical History:  Diagnosis Date  . Diabetes mellitus without complication (HCC)   . Hypertension     MEDICATIONS AT HOME: Prior to Admission medications   Medication Sig Start Date End Date Taking? Authorizing Provider  amoxicillin-clavulanate (AUGMENTIN) 875-125 MG tablet Take 1 tablet by mouth 2 (two) times daily. One po bid x 7 days 10/08/18  Yes Zadie Rhine, MD  Aspirin-Caffeine [facility] FAST PAIN RELIEF ARTHRITIS PO) Take 2 Packages by mouth as needed (arthritis pain).   Yes [provider]  glipiZIDE (GLUCOTROL) 5 MG tablet Take 1 tablet (5 mg total) by mouth daily before breakfast. 10/17/18  Yes Timothy Townsel M, PA-C  HYDROcodone-acetaminophen (NORCO/VICODIN) 5-325 MG tablet Take 1 tablet by mouth every 6 (six) hours as needed for severe pain. 10/08/18  Yes Zadie Rhine, MD  metFORMIN (GLUCOPHAGE) 1000 MG tablet Take 1 tablet (1,000 mg total) by mouth 2 (two) times daily. 10/17/18  Yes Georgian Co M, PA-C  Multiple Vitamins-Minerals (MULTIVITAMIN ADULT PO) Take 1 tablet by mouth daily.   Yes [provider]  lisinopril-hydrochlorothiazide (ZESTORETIC) 20-12.5 MG tablet Take 1 tablet by mouth daily. 10/17/18   Anders Simmonds, PA-C  mupirocin ointment (BACTROBAN) 2 % Apply bid to wound for 10 dyas 10/17/18   Anders Simmonds, PA-C     Objective:  EXAM:   Vitals:   10/17/18 1138  BP: (!) 170/99  Pulse: 69  Temp: 97.6 F (36.4 C)  SpO2: 99%  Weight: 226 lb (102.5 kg)    General appearance : A&OX3. NAD. Non-toxic-appearing HEENT: Atraumatic and Normocephalic.  Extremities: Bilateral Lower Ext shows no edema, both legs are warm to touch with = pulse throughout R foot-see 10/08/2018 photo-appears unchanged.  Skin(other than ulceration) is warm, dry, and in tact.  Pulses present=B.   Neurology:  CN II-XII grossly intact, Non focal.   Psych:  TP linear. J/I WNL. Normal speech. Appropriate eye contact and affect.   Skin:  No Rash  Data Review Lab Results  Component Value Date   HGBA1C 5.7 10/17/2018   HGBA1C 9.1 09/28/2013   HGBA1C 9.3 02/12/2013     Assessment & Plan   1. Wound of right foot - AMB referral to wound care center - mupirocin ointment (BACTROBAN) 2 %; Apply bid to wound for 10 dyas  Dispense: 22 g; Refill: 0  2. Type 2 diabetes mellitus with hyperglycemia, unspecified whether long term insulin use (HCC) Controlled-continue current regimen - Glucose (CBG) - HgB A1c - AMB referral to wound care center - metFORMIN (GLUCOPHAGE) 1000 MG tablet; Take 1 tablet (1,000 mg total) by mouth 2 (two) times daily.  Dispense: 180 tablet; Refill: 1 - glipiZIDE (GLUCOTROL) 5 MG tablet; Take 1 tablet (5 mg total) by mouth daily before breakfast.  Dispense: 90 tablet; Refill: 0  3. Hypertension, unspecified type Uncontrolled-will stop plain lisinopril 20mg  - lisinopril-hydrochlorothiazide (ZESTORETIC) 20-12.5 MG tablet; Take 1 tablet by mouth daily.  Dispense: 90 tablet; Refill: 3  4. Hospital discharge follow-up Unchanged/improved slightly.   Patient have been counseled extensively about nutrition and exercise  Return in about 1 month (around 11/17/2018) for assign PCP; recheck BP/DM.  The patient was given clear instructions to go to ER or return to medical center if symptoms don't improve, worsen or new problems develop. The patient verbalized understanding. The patient was told to call to get lab results if they haven't heard anything in the next week.     Georgian CoAngela Zakir Henner, PA-C [facility], [place] [phone]   10/17/2018, 11:55 AM

## 2018-11-13 ENCOUNTER — Inpatient Hospital Stay (HOSPITAL_COMMUNITY)
Admission: EM | Admit: 2018-11-13 | Discharge: 2018-11-20 | DRG: 854 | Disposition: A | Discharge status: Rehabilitation Facility | Payer: Medicare Other | Attending: Internal Medicine | Admitting: Internal Medicine

## 2018-11-13 ENCOUNTER — Emergency Department (HOSPITAL_COMMUNITY): Payer: Medicare Other

## 2018-11-13 ENCOUNTER — Other Ambulatory Visit: Payer: Self-pay

## 2018-11-13 ENCOUNTER — Encounter (HOSPITAL_COMMUNITY): Payer: Self-pay | Admitting: Internal Medicine

## 2018-11-13 DIAGNOSIS — I152 Hypertension secondary to endocrine disorders: Secondary | ICD-10-CM | POA: Diagnosis present

## 2018-11-13 DIAGNOSIS — D62 Acute posthemorrhagic anemia: Secondary | ICD-10-CM | POA: Diagnosis not present

## 2018-11-13 DIAGNOSIS — I70229 Atherosclerosis of native arteries of extremities with rest pain, unspecified extremity: Secondary | ICD-10-CM

## 2018-11-13 DIAGNOSIS — I70261 Atherosclerosis of native arteries of extremities with gangrene, right leg: Secondary | ICD-10-CM | POA: Diagnosis not present

## 2018-11-13 DIAGNOSIS — A419 Sepsis, unspecified organism: Secondary | ICD-10-CM | POA: Diagnosis present

## 2018-11-13 DIAGNOSIS — I071 Rheumatic tricuspid insufficiency: Secondary | ICD-10-CM | POA: Diagnosis present

## 2018-11-13 DIAGNOSIS — R52 Pain, unspecified: Secondary | ICD-10-CM | POA: Diagnosis not present

## 2018-11-13 DIAGNOSIS — S91301A Unspecified open wound, right foot, initial encounter: Secondary | ICD-10-CM

## 2018-11-13 DIAGNOSIS — I96 Gangrene, not elsewhere classified: Secondary | ICD-10-CM | POA: Diagnosis not present

## 2018-11-13 DIAGNOSIS — E1122 Type 2 diabetes mellitus with diabetic chronic kidney disease: Secondary | ICD-10-CM | POA: Diagnosis present

## 2018-11-13 DIAGNOSIS — I739 Peripheral vascular disease, unspecified: Secondary | ICD-10-CM

## 2018-11-13 DIAGNOSIS — R6520 Severe sepsis without septic shock: Secondary | ICD-10-CM | POA: Diagnosis present

## 2018-11-13 DIAGNOSIS — Z89511 Acquired absence of right leg below knee: Secondary | ICD-10-CM | POA: Diagnosis not present

## 2018-11-13 DIAGNOSIS — L97519 Non-pressure chronic ulcer of other part of right foot with unspecified severity: Secondary | ICD-10-CM | POA: Diagnosis not present

## 2018-11-13 DIAGNOSIS — I130 Hypertensive heart and chronic kidney disease with heart failure and stage 1 through stage 4 chronic kidney disease, or unspecified chronic kidney disease: Secondary | ICD-10-CM | POA: Diagnosis present

## 2018-11-13 DIAGNOSIS — N182 Chronic kidney disease, stage 2 (mild): Secondary | ICD-10-CM | POA: Diagnosis not present

## 2018-11-13 DIAGNOSIS — B999 Unspecified infectious disease: Secondary | ICD-10-CM | POA: Diagnosis not present

## 2018-11-13 DIAGNOSIS — M869 Osteomyelitis, unspecified: Secondary | ICD-10-CM | POA: Diagnosis present

## 2018-11-13 DIAGNOSIS — E1165 Type 2 diabetes mellitus with hyperglycemia: Secondary | ICD-10-CM | POA: Diagnosis present

## 2018-11-13 DIAGNOSIS — E441 Mild protein-calorie malnutrition: Secondary | ICD-10-CM | POA: Diagnosis not present

## 2018-11-13 DIAGNOSIS — I129 Hypertensive chronic kidney disease with stage 1 through stage 4 chronic kidney disease, or unspecified chronic kidney disease: Secondary | ICD-10-CM | POA: Diagnosis not present

## 2018-11-13 DIAGNOSIS — E1159 Type 2 diabetes mellitus with other circulatory complications: Secondary | ICD-10-CM | POA: Diagnosis not present

## 2018-11-13 DIAGNOSIS — E1152 Type 2 diabetes mellitus with diabetic peripheral angiopathy with gangrene: Secondary | ICD-10-CM | POA: Diagnosis present

## 2018-11-13 DIAGNOSIS — I5032 Chronic diastolic (congestive) heart failure: Secondary | ICD-10-CM | POA: Diagnosis present

## 2018-11-13 DIAGNOSIS — N179 Acute kidney failure, unspecified: Secondary | ICD-10-CM

## 2018-11-13 DIAGNOSIS — E1169 Type 2 diabetes mellitus with other specified complication: Secondary | ICD-10-CM | POA: Diagnosis present

## 2018-11-13 DIAGNOSIS — M86171 Other acute osteomyelitis, right ankle and foot: Secondary | ICD-10-CM | POA: Diagnosis not present

## 2018-11-13 DIAGNOSIS — I4892 Unspecified atrial flutter: Secondary | ICD-10-CM | POA: Diagnosis present

## 2018-11-13 DIAGNOSIS — I471 Supraventricular tachycardia: Secondary | ICD-10-CM | POA: Diagnosis present

## 2018-11-13 DIAGNOSIS — Z4781 Encounter for orthopedic aftercare following surgical amputation: Secondary | ICD-10-CM | POA: Diagnosis not present

## 2018-11-13 DIAGNOSIS — L03115 Cellulitis of right lower limb: Secondary | ICD-10-CM | POA: Diagnosis not present

## 2018-11-13 DIAGNOSIS — I2510 Atherosclerotic heart disease of native coronary artery without angina pectoris: Secondary | ICD-10-CM | POA: Diagnosis present

## 2018-11-13 DIAGNOSIS — R000 Tachycardia, unspecified: Secondary | ICD-10-CM

## 2018-11-13 DIAGNOSIS — E1142 Type 2 diabetes mellitus with diabetic polyneuropathy: Secondary | ICD-10-CM | POA: Diagnosis present

## 2018-11-13 DIAGNOSIS — I998 Other disorder of circulatory system: Secondary | ICD-10-CM | POA: Diagnosis not present

## 2018-11-13 DIAGNOSIS — Z20828 Contact with and (suspected) exposure to other viral communicable diseases: Secondary | ICD-10-CM | POA: Diagnosis present

## 2018-11-13 DIAGNOSIS — D649 Anemia, unspecified: Secondary | ICD-10-CM | POA: Diagnosis not present

## 2018-11-13 DIAGNOSIS — R0902 Hypoxemia: Secondary | ICD-10-CM | POA: Diagnosis not present

## 2018-11-13 DIAGNOSIS — K5900 Constipation, unspecified: Secondary | ICD-10-CM | POA: Diagnosis present

## 2018-11-13 DIAGNOSIS — I491 Atrial premature depolarization: Secondary | ICD-10-CM | POA: Diagnosis not present

## 2018-11-13 DIAGNOSIS — Z7984 Long term (current) use of oral hypoglycemic drugs: Secondary | ICD-10-CM

## 2018-11-13 DIAGNOSIS — I4891 Unspecified atrial fibrillation: Secondary | ICD-10-CM | POA: Diagnosis not present

## 2018-11-13 DIAGNOSIS — E46 Unspecified protein-calorie malnutrition: Secondary | ICD-10-CM | POA: Diagnosis not present

## 2018-11-13 DIAGNOSIS — I480 Paroxysmal atrial fibrillation: Secondary | ICD-10-CM | POA: Diagnosis not present

## 2018-11-13 DIAGNOSIS — N183 Chronic kidney disease, stage 3 (moderate): Secondary | ICD-10-CM | POA: Diagnosis not present

## 2018-11-13 DIAGNOSIS — D631 Anemia in chronic kidney disease: Secondary | ICD-10-CM | POA: Diagnosis present

## 2018-11-13 DIAGNOSIS — I499 Cardiac arrhythmia, unspecified: Secondary | ICD-10-CM | POA: Diagnosis not present

## 2018-11-13 DIAGNOSIS — I10 Essential (primary) hypertension: Secondary | ICD-10-CM | POA: Diagnosis not present

## 2018-11-13 DIAGNOSIS — N170 Acute kidney failure with tubular necrosis: Secondary | ICD-10-CM | POA: Diagnosis not present

## 2018-11-13 DIAGNOSIS — D72829 Elevated white blood cell count, unspecified: Secondary | ICD-10-CM | POA: Diagnosis not present

## 2018-11-13 DIAGNOSIS — E119 Type 2 diabetes mellitus without complications: Secondary | ICD-10-CM

## 2018-11-13 DIAGNOSIS — L97514 Non-pressure chronic ulcer of other part of right foot with necrosis of bone: Secondary | ICD-10-CM | POA: Diagnosis not present

## 2018-11-13 DIAGNOSIS — G8918 Other acute postprocedural pain: Secondary | ICD-10-CM | POA: Diagnosis not present

## 2018-11-13 DIAGNOSIS — I361 Nonrheumatic tricuspid (valve) insufficiency: Secondary | ICD-10-CM | POA: Diagnosis not present

## 2018-11-13 DIAGNOSIS — Z833 Family history of diabetes mellitus: Secondary | ICD-10-CM

## 2018-11-13 HISTORY — DX: Type 2 diabetes mellitus without complications: E11.9

## 2018-11-13 LAB — CBC WITH DIFFERENTIAL/PLATELET
Abs Immature Granulocytes: 0 10*3/uL (ref 0.00–0.07)
Basophils Absolute: 0 10*3/uL (ref 0.0–0.1)
Basophils Relative: 0 %
Eosinophils Absolute: 0 10*3/uL (ref 0.0–0.5)
Eosinophils Relative: 0 %
HCT: 29.2 % — ABNORMAL LOW (ref 39.0–52.0)
Hemoglobin: 9.7 g/dL — ABNORMAL LOW (ref 13.0–17.0)
Lymphocytes Relative: 8 %
Lymphs Abs: 3.6 10*3/uL (ref 0.7–4.0)
MCH: 30.4 pg (ref 26.0–34.0)
MCHC: 33.2 g/dL (ref 30.0–36.0)
MCV: 91.5 fL (ref 80.0–100.0)
Monocytes Absolute: 1.8 10*3/uL — ABNORMAL HIGH (ref 0.1–1.0)
Monocytes Relative: 4 %
Neutro Abs: 39.5 10*3/uL — ABNORMAL HIGH (ref 1.7–7.7)
Neutrophils Relative %: 88 %
Platelets: 356 10*3/uL (ref 150–400)
RBC: 3.19 MIL/uL — ABNORMAL LOW (ref 4.22–5.81)
RDW: 13 % (ref 11.5–15.5)
WBC: 44.9 10*3/uL — ABNORMAL HIGH (ref 4.0–10.5)
nRBC: 0 % (ref 0.0–0.2)
nRBC: 0 /100 WBC

## 2018-11-13 LAB — GLUCOSE, CAPILLARY: Glucose-Capillary: 203 mg/dL — ABNORMAL HIGH (ref 70–99)

## 2018-11-13 LAB — COMPREHENSIVE METABOLIC PANEL
ALT: 22 U/L (ref 0–44)
AST: 32 U/L (ref 15–41)
Albumin: 2.6 g/dL — ABNORMAL LOW (ref 3.5–5.0)
Alkaline Phosphatase: 78 U/L (ref 38–126)
Anion gap: 19 — ABNORMAL HIGH (ref 5–15)
BUN: 76 mg/dL — ABNORMAL HIGH (ref 8–23)
CO2: 14 mmol/L — ABNORMAL LOW (ref 22–32)
Calcium: 9 mg/dL (ref 8.9–10.3)
Chloride: 97 mmol/L — ABNORMAL LOW (ref 98–111)
Creatinine, Ser: 3.15 mg/dL — ABNORMAL HIGH (ref 0.61–1.24)
GFR calc Af Amer: 22 mL/min — ABNORMAL LOW (ref >60)
GFR calc non Af Amer: 19 mL/min — ABNORMAL LOW (ref >60)
Glucose, Bld: 271 mg/dL — ABNORMAL HIGH (ref 70–99)
Potassium: 4.1 mmol/L (ref 3.5–5.1)
Sodium: 130 mmol/L — ABNORMAL LOW (ref 135–145)
Total Bilirubin: 0.6 mg/dL (ref 0.3–1.2)
Total Protein: 7.7 g/dL (ref 6.5–8.1)

## 2018-11-13 LAB — SARS CORONAVIRUS 2: SARS Coronavirus 2: NOT DETECTED

## 2018-11-13 LAB — CBG MONITORING, ED: Glucose-Capillary: 192 mg/dL — ABNORMAL HIGH (ref 70–99)

## 2018-11-13 LAB — LACTIC ACID, PLASMA: Lactic Acid, Venous: 4 mmol/L (ref 0.5–1.9)

## 2018-11-13 IMAGING — CR CHEST  1 VIEW
1 series · 1 of 1 positions shown · non-contrast
Comparison: None.

CLINICAL DATA: Tachycardia.

EXAM:
CHEST  1 VIEW

[chest ap]
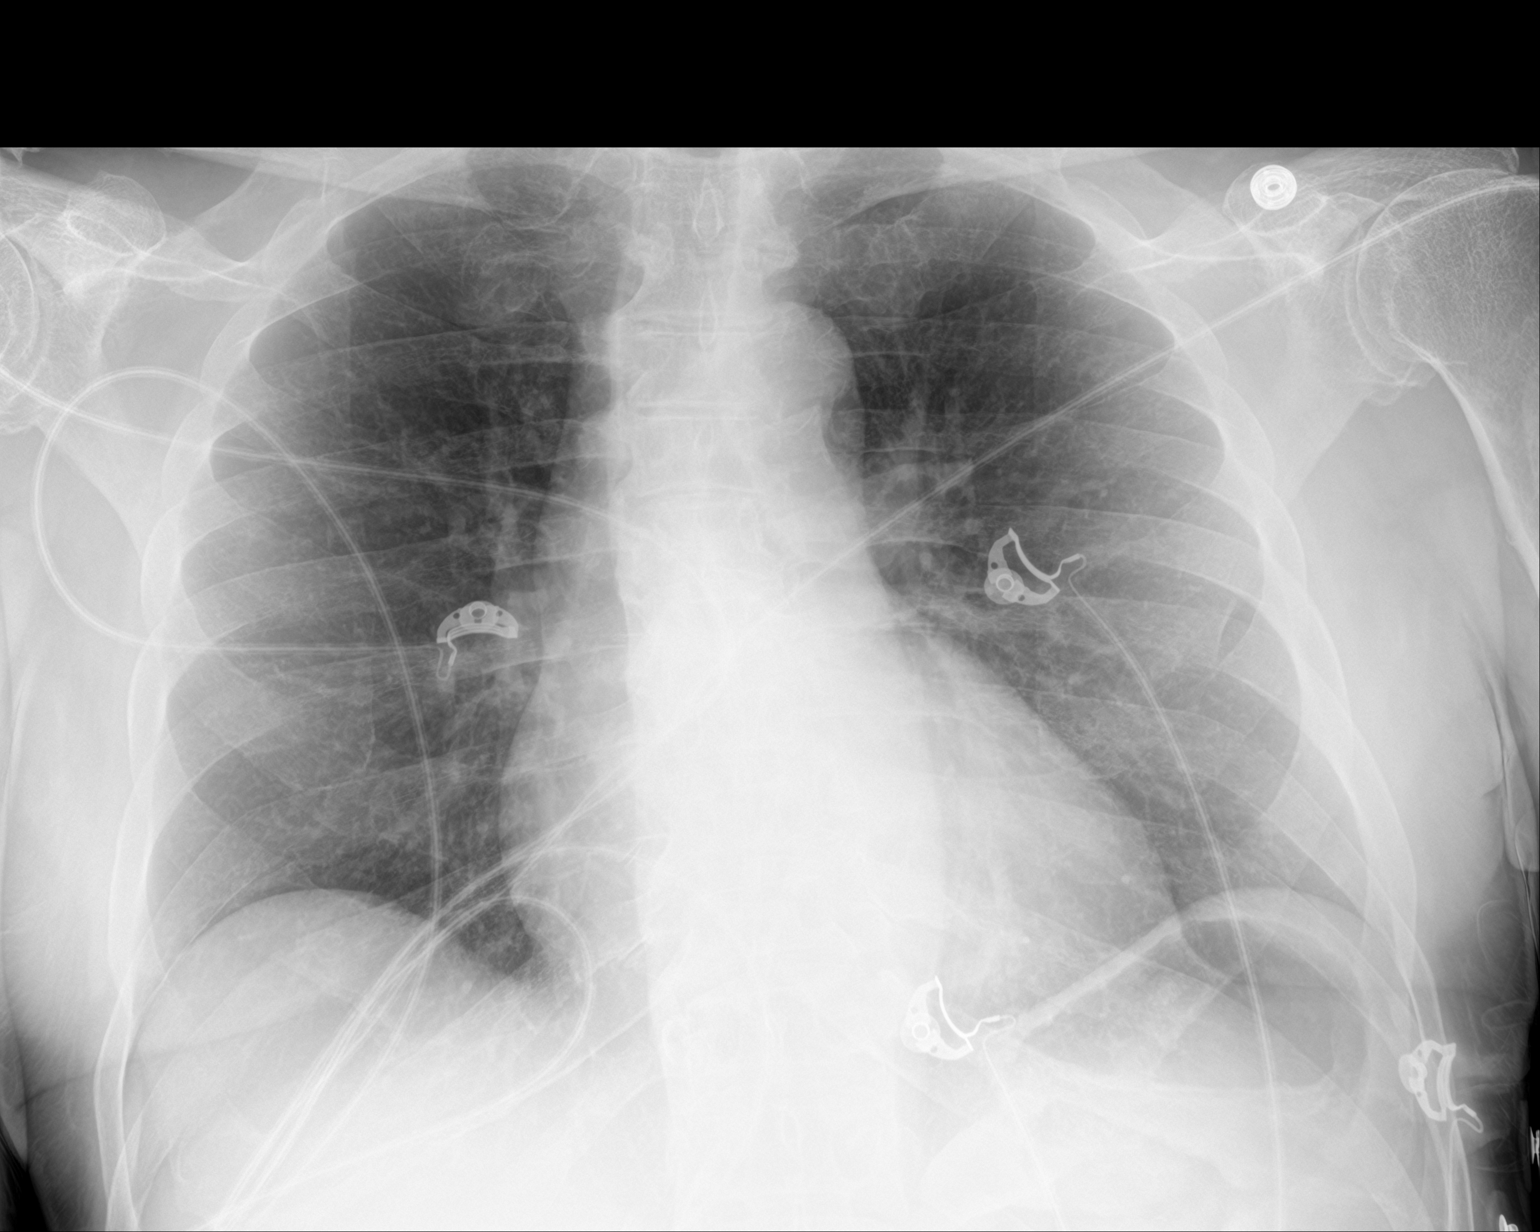

[1 of 1 positions shown; findings below may reference images not displayed]

FINDINGS: The heart size and mediastinal contours are within normal limits.
Both lungs are clear. The visualized skeletal structures are
unremarkable.
IMPRESSION: No active disease.

## 2018-11-13 IMAGING — CR RIGHT FOOT COMPLETE - 3+ VIEW
3 series · 3 of 3 positions shown · non-contrast
Comparison: Radiograph [DATE]

CLINICAL DATA: Infection. Foot pain.

EXAM:
RIGHT FOOT COMPLETE - 3+ VIEW

[foot ap]
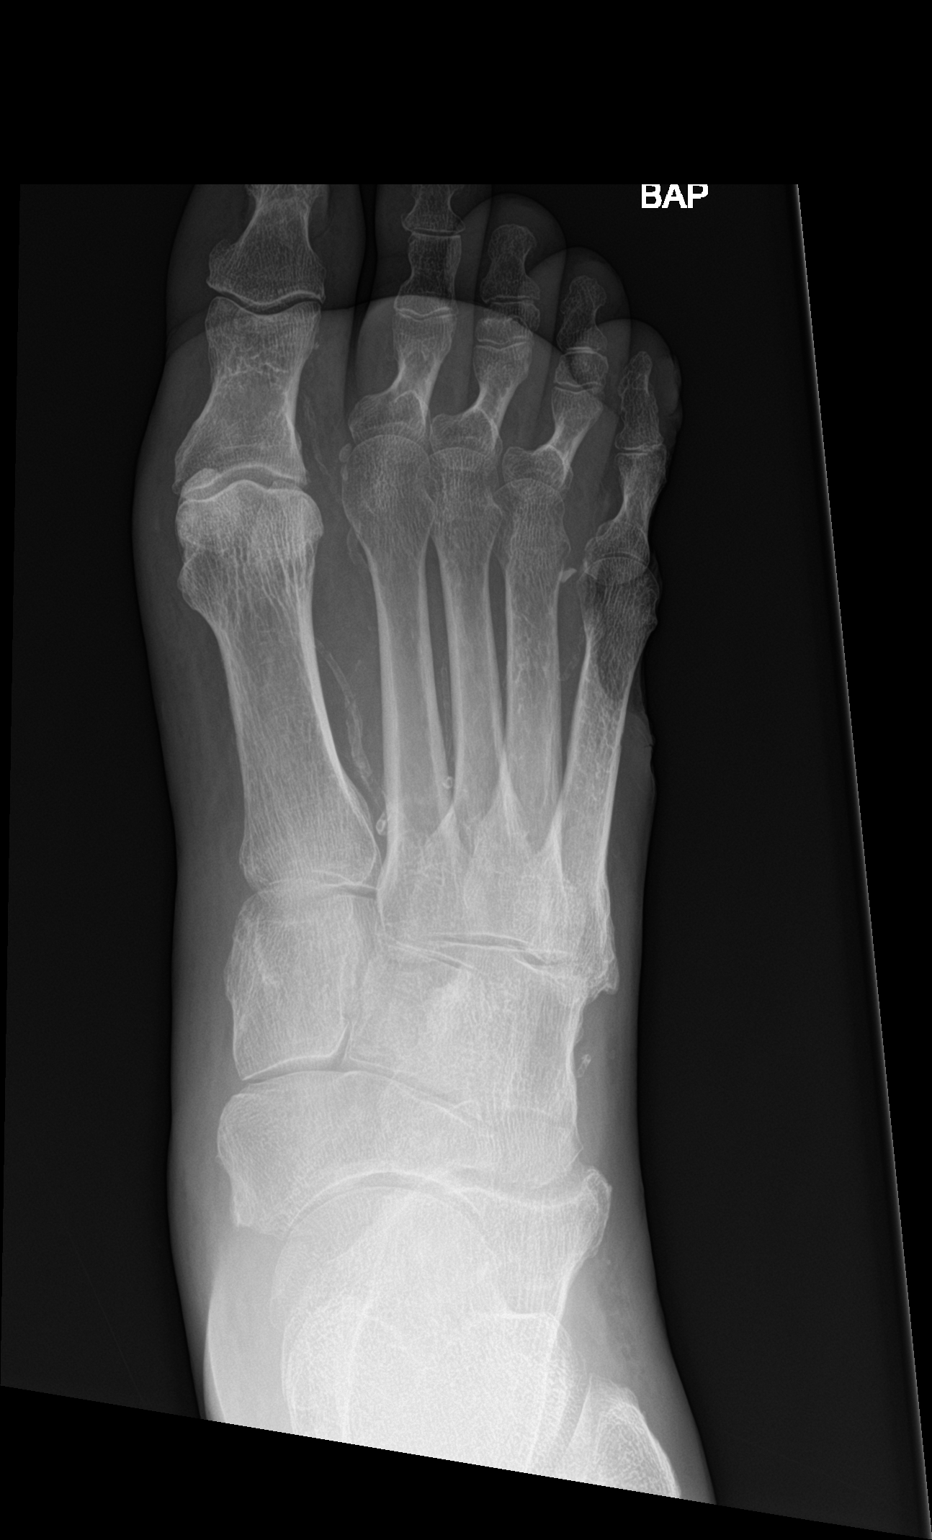

[foot obl]
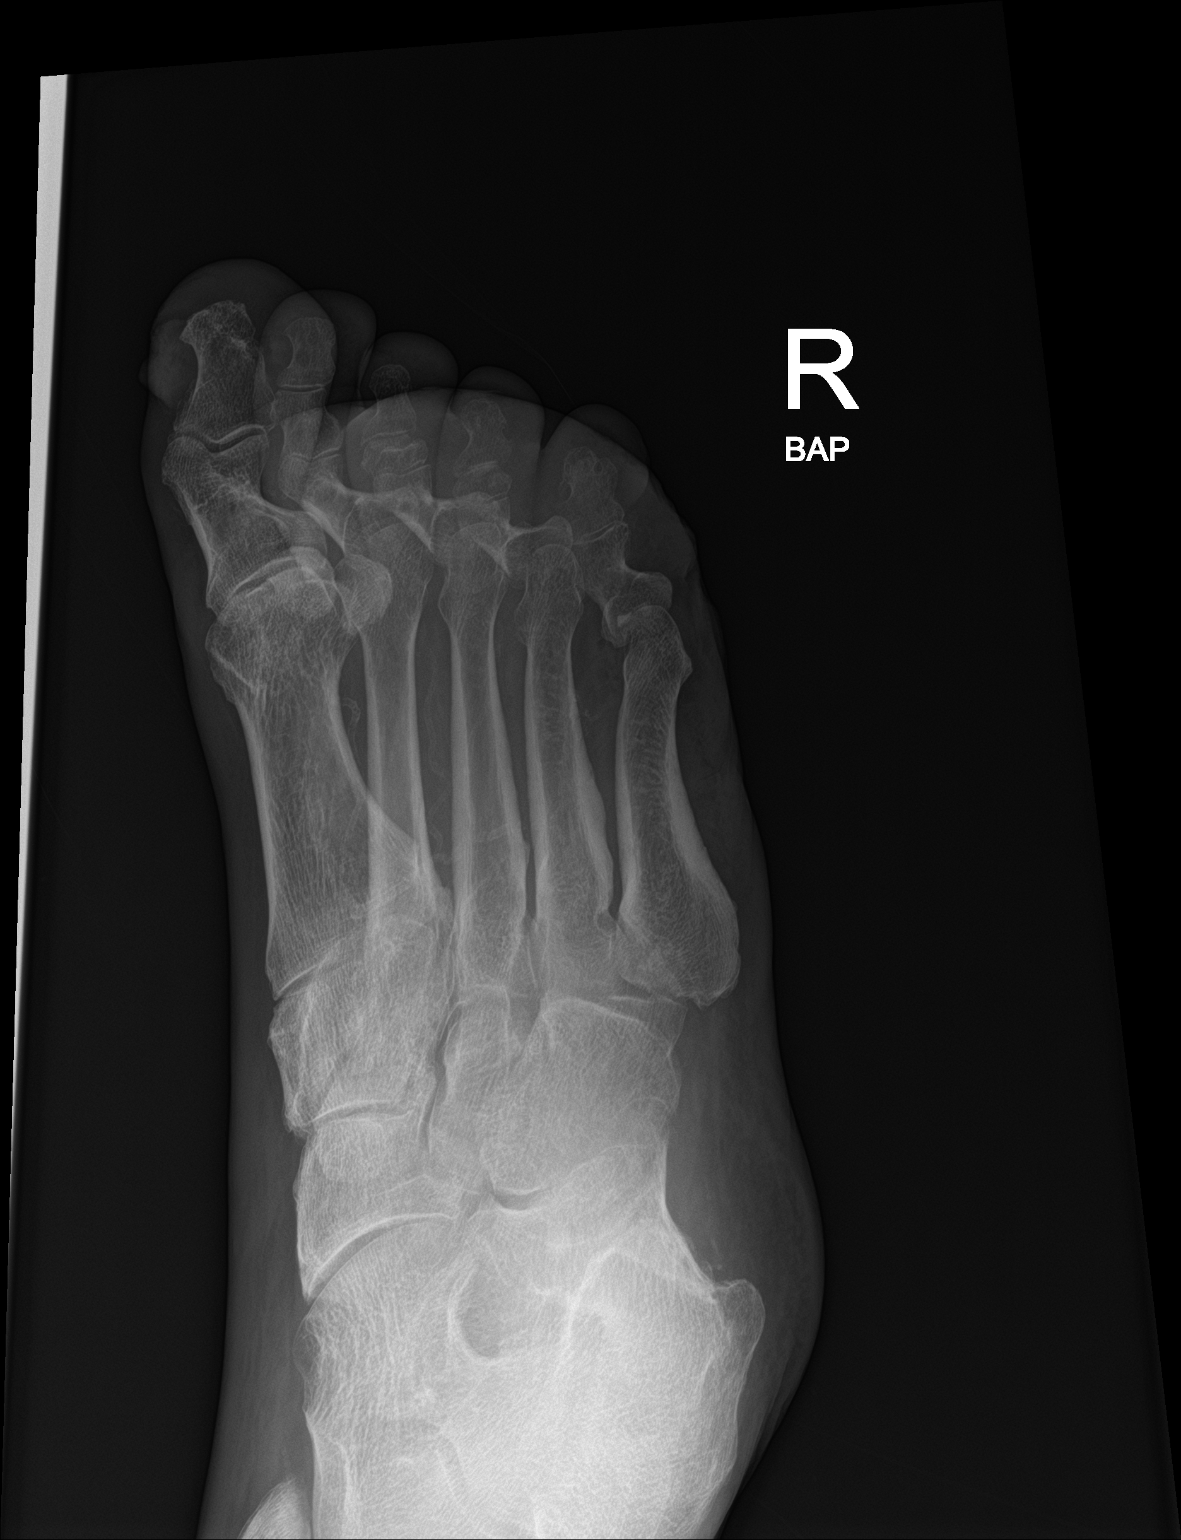

[foot lat]
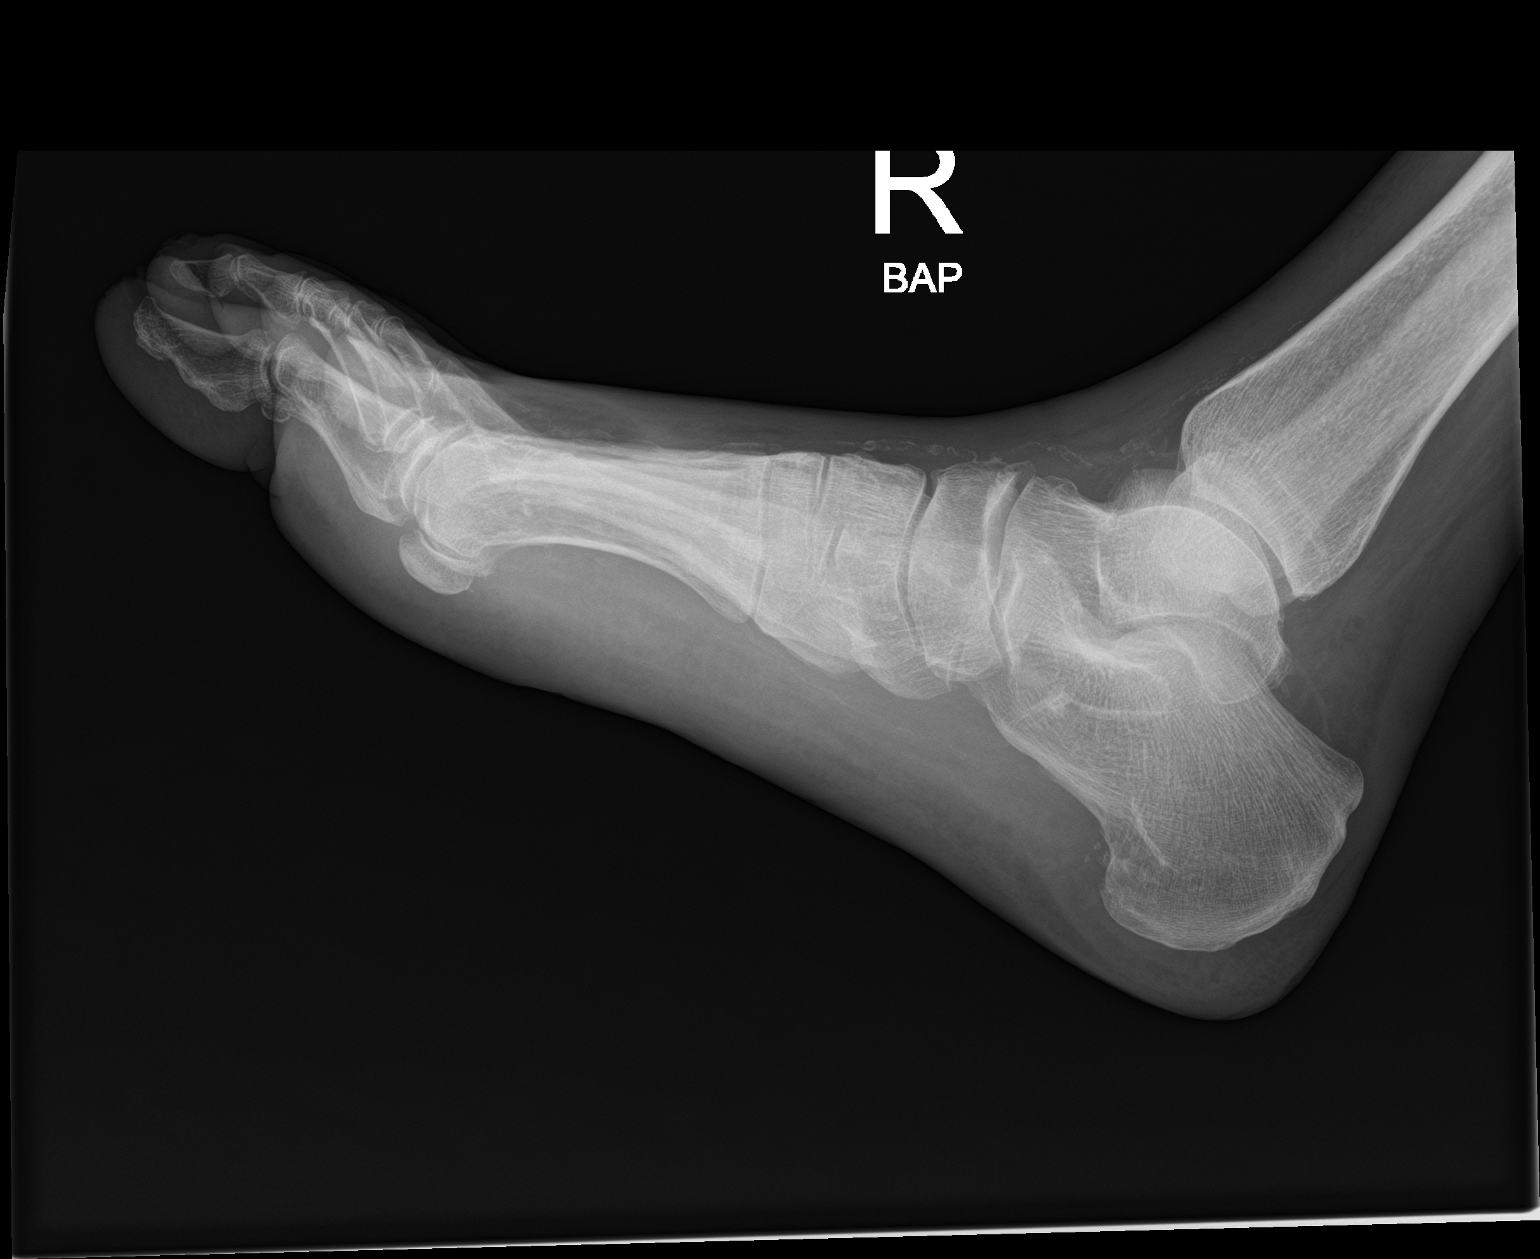

[3 of 3 positions shown; findings below may reference images not displayed]

FINDINGS: Soft tissue ulcer about the lateral aspect of the fifth metatarsal
and metatarsal phalangeal joint with soft tissue air. No associated
periosteal reaction or bony destruction. There is sclerosis about
the adjacent sesamoid. Hammertoe deformity of the digits limits
assessment. No acute fracture. There are vascular calcifications.
IMPRESSION: Soft tissue ulcer about the fifth metatarsophalangeal joint with
soft tissue air. No radiographic findings of osteomyelitis.

## 2018-11-13 MED ORDER — ACETAMINOPHEN 650 MG RE SUPP: 650.0000 mg | Freq: Four times a day (QID) | RECTAL | Status: DC | PRN

## 2018-11-13 MED ORDER — INSULIN ASPART 100 UNIT/ML ~~LOC~~ SOLN
0.0000 [IU] | Freq: Three times a day (TID) | SUBCUTANEOUS | Status: DC
  Administered 2018-11-14: 2 [IU] via SUBCUTANEOUS
  Administered 2018-11-14 (×2): 3 [IU] via SUBCUTANEOUS
  Administered 2018-11-15: 5 [IU] via SUBCUTANEOUS
  Administered 2018-11-15: 15 [IU] via SUBCUTANEOUS
  Administered 2018-11-16 (×3): 3 [IU] via SUBCUTANEOUS
  Administered 2018-11-17 – 2018-11-19 (×4): 2 [IU] via SUBCUTANEOUS
  Administered 2018-11-20: 3 [IU] via SUBCUTANEOUS

## 2018-11-13 MED ORDER — ONDANSETRON HCL 4 MG PO TABS: 4.0000 mg | ORAL_TABLET | Freq: Four times a day (QID) | ORAL | Status: DC | PRN

## 2018-11-13 MED ORDER — HYDROMORPHONE HCL 1 MG/ML IJ SOLN: 0.5000 mg | INTRAMUSCULAR | Status: DC | PRN

## 2018-11-13 MED ORDER — SODIUM CHLORIDE 0.9 % IV BOLUS (SEPSIS)
500.0000 mL | Freq: Once | INTRAVENOUS | Status: AC
  Administered 2018-11-13: 500 mL via INTRAVENOUS

## 2018-11-13 MED ORDER — SODIUM CHLORIDE 0.9 % IV SOLN
2.0000 g | Freq: Once | INTRAVENOUS | Status: AC
  Administered 2018-11-13: 2 g via INTRAVENOUS
  Filled 2018-11-13: qty 20

## 2018-11-13 MED ORDER — VANCOMYCIN HCL IN DEXTROSE 1-5 GM/200ML-% IV SOLN: 1000.0000 mg | Freq: Once | INTRAVENOUS | Status: DC

## 2018-11-13 MED ORDER — OXYCODONE HCL 5 MG PO TABS: 5.0000 mg | ORAL_TABLET | Freq: Four times a day (QID) | ORAL | Status: DC | PRN

## 2018-11-13 MED ORDER — SODIUM CHLORIDE 0.9 % IV SOLN
2.0000 g | INTRAVENOUS | Status: DC
  Administered 2018-11-14 – 2018-11-15 (×2): 2 g via INTRAVENOUS
  Filled 2018-11-13 (×2): qty 2
  Filled 2018-11-13 (×3): qty 20

## 2018-11-13 MED ORDER — INSULIN ASPART 100 UNIT/ML ~~LOC~~ SOLN
0.0000 [IU] | Freq: Every day | SUBCUTANEOUS | Status: DC
  Administered 2018-11-14 – 2018-11-15 (×2): 2 [IU] via SUBCUTANEOUS

## 2018-11-13 MED ORDER — SODIUM CHLORIDE 0.9 % IV BOLUS (SEPSIS)
1000.0000 mL | Freq: Once | INTRAVENOUS | Status: AC
  Administered 2018-11-13: 1000 mL via INTRAVENOUS

## 2018-11-13 MED ORDER — DILTIAZEM HCL-DEXTROSE 100-5 MG/100ML-% IV SOLN (PREMIX)
5.0000 mg/h | INTRAVENOUS | Status: DC
  Filled 2018-11-13: qty 100

## 2018-11-13 MED ORDER — VANCOMYCIN HCL 10 G IV SOLR
2000.0000 mg | Freq: Once | INTRAVENOUS | Status: AC
  Administered 2018-11-13: 2000 mg via INTRAVENOUS
  Filled 2018-11-13: qty 2000

## 2018-11-13 MED ORDER — ACETAMINOPHEN 325 MG PO TABS: 650.0000 mg | ORAL_TABLET | Freq: Four times a day (QID) | ORAL | Status: DC | PRN

## 2018-11-13 MED ORDER — SODIUM CHLORIDE 0.9% FLUSH
3.0000 mL | Freq: Two times a day (BID) | INTRAVENOUS | Status: DC
  Administered 2018-11-13 – 2018-11-19 (×12): 3 mL via INTRAVENOUS

## 2018-11-13 MED ORDER — LACTATED RINGERS IV SOLN
INTRAVENOUS | Status: DC
  Administered 2018-11-13: 22:00:00 via INTRAVENOUS

## 2018-11-13 MED ORDER — ONDANSETRON HCL 4 MG/2ML IJ SOLN: 4.0000 mg | Freq: Four times a day (QID) | INTRAMUSCULAR | Status: DC | PRN

## 2018-11-13 NOTE — ED Notes (Signed)
Admitting at bedside 

## 2018-11-13 NOTE — ED Notes (Signed)
Patient transported to X-ray 

## 2018-11-13 NOTE — H&P (Addendum)
History and Physical    Joseph Blackwelder ZOX:[id number] DOB: 1946-03-24 DOA: 11/13/2018  PCP: Patient, No Pcp Per  Patient coming from: Home via EMS  I have personally briefly reviewed patient's old medical [facility]  Chief Complaint: Right foot pain, malaise, nausea  HPI: Joseph [name] is a 73 y.o. male with medical history significant for type 2 diabetes, hypertension, CKD stage II who presents to the ED for evaluation of right foot wound that has been ongoing for the last 2 months.  Patient first noticed a wound to his right foot about 2 months ago.  He noticed a small laceration to his right dorsal foot after doing yard work.  He was seen in the ED initially on 09/12/2018 and treated with a 5-day course of doxycycline for cellulitis and superficial ulcer.  He was seen again on 09/20/2018 in the ED and switched to oral clindamycin.  He was seen for follow-up wound check in the ED on 09/23/2018 at which time it was felt his wound was improving.  He was seen again on 10/08/2018 and the wound appeared to be healing with chronic necrotic appearance.  He was given a prescription for oral Augmentin.  He was seen by community health and wellness to establish care on 10/17/2018 during which time he felt his foot pain and wound was improving.  He was given oral Augmentin and referred to the wound care clinic.  Patient states he has not followed up with the wound care clinic but has been attempting to treat his wounds at home himself with gauze and Bactroban ointment.  He says over the last few days he noticed some increased swelling of his right lower extremity and worsening pain.  He noticed his ulcer deepening on a right side of his foot and saw some light yellow-colored drainage.  He also noticed a large blister forming on the plantar surface of his foot which opened up and drained serosanguineous fluid.  Due to the worsening pain, he has been taking a significant amount of NSAIDs including up to  4 BC powders and for relief per day recently.  Over the last 2 days he began to develop poor appetite, nausea, and malaise.  Because of his worsening symptoms, he presented to the ED for further evaluation.  ED Course:  Initial vitals showed BP 84/61, pulse 152, RR 11, temp 97.4 Fahrenheit, SPO2 90% on room air.  Labs are notable for WBC 44.9, absolute neutrophils 39.5, hemoglobin 9.7, platelets 356,000, sodium 130 (134 when corrected for hyperglycemia), potassium 4.1, bicarb 14, BUN 76, creatinine 3.15, serum glucose 271, anion gap 19, lactic acid 4.0.  Blood cultures were obtained and pending.  SARS-CoV-2 test was negative.  Portable chest x-ray was negative for focal consolidation, effusion, or infiltrate.  Right foot x-ray showed soft tissue ulcer about the fifth MTP joint with soft tissue air and no radiographic findings of osteomyelitis.  Patient was given 3.5 L of normal saline and started on IV vancomycin and ceftriaxone.  Orthopedics were consulted and will see in a.m.  Hospitalist service was consulted to admit for further evaluation and management of sepsis.  Review of Systems: All systems reviewed and are negative except as documented in history of present illness above.   Past Medical History:  Diagnosis Date  . Diabetes mellitus without complication (HCC)   . Hypertension     Past Surgical History:  Procedure Laterality Date  . HAND SURGERY      Social History:  reports  that he has never smoked. He has never used smokeless tobacco. He reports current alcohol use. He reports that he does not use drugs.  No Known Allergies  Family History  Problem Relation Age of Onset  . Diabetes Father      Prior to Admission medications   Medication Sig Start Date End Date Taking? Authorizing Provider  amoxicillin-clavulanate (AUGMENTIN) 875-125 MG tablet Take 1 tablet by mouth 2 (two) times daily. One po bid x 7 days 10/08/18   Ripley Fraise, MD  Aspirin-Caffeine [facility] FAST  PAIN RELIEF ARTHRITIS PO) Take 2 Packages by mouth as needed (arthritis pain).    [provider]  glipiZIDE (GLUCOTROL) 5 MG tablet Take 1 tablet (5 mg total) by mouth daily before breakfast. 10/17/18   Argentina Donovan, PA-C  HYDROcodone-acetaminophen (NORCO/VICODIN) 5-325 MG tablet Take 1 tablet by mouth every 6 (six) hours as needed for severe pain. 10/08/18   Ripley Fraise, MD  lisinopril-hydrochlorothiazide (ZESTORETIC) 20-12.5 MG tablet Take 1 tablet by mouth daily. 10/17/18   Argentina Donovan, PA-C  metFORMIN (GLUCOPHAGE) 1000 MG tablet Take 1 tablet (1,000 mg total) by mouth 2 (two) times daily. 10/17/18   Argentina Donovan, PA-C  Multiple Vitamins-Minerals (MULTIVITAMIN ADULT PO) Take 1 tablet by mouth daily.    [provider]  mupirocin ointment (BACTROBAN) 2 % Apply bid to wound for 10 dyas 10/17/18   Argentina Donovan, [place]    Physical Exam: Vitals:   11/13/18 2015 11/13/18 2030 11/13/18 2045 11/13/18 2100  BP: 115/65 101/63 105/64 95/70  Pulse: (!) 104 89 89 89  Resp: 20 (!) 9 (!) 22 17  Temp:      TempSrc:      SpO2: 100% 100% 100% 100%  Weight:      Height:        Constitutional: Elderly man resting supine in bed, NAD, calm, comfortable Eyes: PERRL, lids and conjunctivae normal ENMT: Mucous membranes are dry. Posterior pharynx clear of any exudate or lesions. Neck: normal, supple, no masses. Respiratory: clear to auscultation bilaterally, no wheezing, no crackles. Normal respiratory effort. No accessory muscle use.  Cardiovascular: Regular rate and rhythm, soft systolic murmur.  +1 edema of RLE, unable to palpate bilateral pedal pulses Abdomen: no tenderness, no masses palpated. No hepatosplenomegaly. Bowel sounds positive.  Musculoskeletal: Right lateral wound with ulcerative dry gangrene and necrotic fifth toe.  There is plantar edema with open blister draining serosanguineous fluid.  See pictures below.  Range of motion of all extremities intact, is  able to wiggle all toes of the right foot except for the fifth toe. Skin: Right lateral wound with ulcerative dry gangrene and necrotic fifth toe.  There is plantar edema with open blister draining serosanguineous fluid.  See pictures below. Neurologic: CN 2-12 grossly intact. Sensation intact, Strength 5/5 in all 4.  Psychiatric: Normal judgment and insight. Alert and oriented x 3. Normal mood.         Labs on Admission: I have personally reviewed following labs and imaging studies  CBC: Recent Labs  Lab 11/13/18 1832  WBC 44.9*  NEUTROABS 39.5*  HGB 9.7*  HCT 29.2*  MCV 91.5  PLT 283   Basic Metabolic Panel: Recent Labs  Lab 11/13/18 1832  NA 130*  K 4.1  CL 97*  CO2 14*  GLUCOSE 271*  BUN 76*  CREATININE 3.15*  CALCIUM 9.0   GFR: Estimated Creatinine Clearance: 28.9 mL/min (A) (by C-G formula based on SCr of 3.15 mg/dL (H)). Liver  Function Tests: Recent Labs  Lab 11/13/18 1832  AST 32  ALT 22  ALKPHOS 78  BILITOT 0.6  PROT 7.7  ALBUMIN 2.6*   No results for input(s): LIPASE, AMYLASE in the last 168 hours. No results for input(s): AMMONIA in the last 168 hours. Coagulation Profile: No results for input(s): INR, PROTIME in the last 168 hours. Cardiac Enzymes: No results for input(s): CKTOTAL, CKMB, CKMBINDEX, TROPONINI in the last 168 hours. BNP (last 3 results) No results for input(s): PROBNP in the last 8760 hours. HbA1C: No results for input(s): HGBA1C in the last 72 hours. CBG: No results for input(s): GLUCAP in the last 168 hours. Lipid Profile: No results for input(s): CHOL, HDL, LDLCALC, TRIG, CHOLHDL, LDLDIRECT in the last 72 hours. Thyroid Function Tests: No results for input(s): TSH, T4TOTAL, FREET4, T3FREE, THYROIDAB in the last 72 hours. Anemia Panel: No results for input(s): VITAMINB12, FOLATE, FERRITIN, TIBC, IRON, RETICCTPCT in the last 72 hours. Urine analysis:    Component Value Date/Time   LABSPEC 1.010 12/01/2010 1914    PHURINE 5.0 12/01/2010 1914   GLUCOSEU 500 (A) 12/01/2010 1914   HGBUR TRACE (A) 12/01/2010 1914   BILIRUBINUR NEGATIVE 12/01/2010 1914   KETONESUR NEGATIVE 12/01/2010 1914   PROTEINUR NEGATIVE 12/01/2010 1914   UROBILINOGEN 0.2 12/01/2010 1914   NITRITE NEGATIVE 12/01/2010 1914   LEUKOCYTESUR NEGATIVE 12/01/2010 1914    Radiological Exams on Admission: Dg Chest 1 View  Result Date: 11/13/2018 CLINICAL DATA:  Tachycardia. EXAM: CHEST  1 VIEW COMPARISON:  None. FINDINGS: The heart size and mediastinal contours are within normal limits. Both lungs are clear. The visualized skeletal structures are unremarkable. IMPRESSION: No active disease. Electronically Signed   By: Obie DredgeWilliam T Derry M.D.   On: 11/13/2018 19:44   Dg Foot Complete Right  Result Date: 11/13/2018 CLINICAL DATA:  Infection. Foot pain. EXAM: RIGHT FOOT COMPLETE - 3+ VIEW COMPARISON:  Radiograph 09/20/2018 FINDINGS: Soft tissue ulcer about the lateral aspect of the fifth metatarsal and metatarsal phalangeal joint with soft tissue air. No associated periosteal reaction or bony destruction. There is sclerosis about the adjacent sesamoid. Hammertoe deformity of the digits limits assessment. No acute fracture. There are vascular calcifications. IMPRESSION: Soft tissue ulcer about the fifth metatarsophalangeal joint with soft tissue air. No radiographic findings of osteomyelitis. Electronically Signed   By: Narda RutherfordMelanie  Sanford M.D.   On: 11/13/2018 19:48    EKG: Independently reviewed.  Initial EKG showed sinus tachycardia, rate 150s, with repolarization changes in V2-V3.  Repeat EKG showed sinus tachycardia rate 105 with PVC and repolarization changes in V2-V3.  Assessment/Plan Principal Problem:   Sepsis with acute renal failure (HCC) Active Problems:   DM (diabetes mellitus) (HCC)   Hypertension associated with diabetes (HCC)   Acute renal failure superimposed on stage 2 chronic kidney disease (HCC)   Open wound of right foot   Joseph [name] is a 73 y.o. male with medical history significant for type 2 diabetes, hypertension, CKD stage II who is admitted with sepsis due to right foot wound.   Sepsis suspected due to right foot wound infection: -Continue empiric IV vancomycin and ceftriaxone -Follow-up blood cultures -Continue IV fluid resuscitation, monitor strict I/O's -Obtain MRI of the right foot to assess for osteomyelitis -Orthopedics consulted and to see in a.m.  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  Complicated by diabetes with suspected underlying peripheral vascular disease. -Sepsis management and orthopedics consultation as above -MRI right foot as above -Obtain ABIs to assess  peripheral vascular disease  Acute on chronic stage II kidney injury with high anion gap metabolic acidosis: Due to significant NSAID use in addition to sepsis. -Continue IV fluid resuscitation -Monitor renal function and I/O's -Avoid NSAIDs/nephrotoxic agents -Holding home metformin and lisinopril-HCTZ  Hyperglycemia in type 2 diabetes: Last A1c 5.7% on 10/17/2018.  He is on metformin and glipizide as an outpatient.  He is hyperglycemic on admission. -Holding metformin and glipizide while in hospital -Start moderate SSI w/ HS coverage, adjust as needed  Hypertension: Hypotensive on admission.  Holding home lisinopril-HCTZ due to hypotension and acute kidney injury.   DVT prophylaxis: SCDs Code Status: Full code, confirmed with patient Family Communication: Patient declined Disposition Plan: Pending clinical progress, orthopedic consultation Consults called: Orthopedics to see in a.m. Admission status: Admit - It is my clinical opinion that admission to INPATIENT is reasonable and necessary because of the expectation that this patient will require hospital care that crosses at least 2 midnights to treat this condition based on the medical complexity of the problems presented.  Given the  aforementioned information, the predictability of an adverse outcome is felt to be significant.      Darreld McleanVishal Rushi Chasen MD [facility]  If 7PM-7AM, please contact night-coverage [email or web address]  11/13/2018, 9:45 PM

## 2018-11-13 NOTE — ED Notes (Signed)
Pt given sandwich and something to drink, denies wanting to contact any family at present

## 2018-11-13 NOTE — ED Triage Notes (Addendum)
Per EMS pt called EMS d/t weakness. Decreased PO intake and vomited 2 days ago. Wound to R leg, hx diabetes. Has been taking OTC meds for wound pain. HR found to be 160 with EMS. Denies chest pain.

## 2018-11-13 NOTE — ED Provider Notes (Signed)
[place] EMERGENCY DEPARTMENT Provider Note   CSN: [id number] Arrival date & time: 11/13/18  1813    History   Chief Complaint Chief Complaint  Patient presents with  . Tachycardia    HPI Joseph [name] is a 73 y.o. male.     HPI Patient presented to the emergency room for evaluation of weakness and tachycardia.  Patient states he was seen in the emergency room a little over a month ago for a wound infection.  Patient was evaluated and outpatient management was recommended.  Patient followed up with her primary care doctor on May 20.  Patient was referred to the wound center.  Patient states it was difficult for him to be evaluated there so he has been managing it at home.  Over the weekend he has had trouble with some decreased appetite.  He started to feel weak but did not measure a fever.  He has noticed some increasing redness around the wound of his right foot but overall feels like it is improving.  He denies any vomiting or diarrhea.  No chest pain or shortness of breath. Past Medical History:  Diagnosis Date  . Diabetes mellitus without complication ([place])   . Hypertension     Patient Active Problem List   Diagnosis Date Noted  . 'light-for-dates' infant with signs of fetal malnutrition 10/17/2018  . DM (diabetes mellitus) ([place]) 07/02/2012  . HTN (hypertension) 07/02/2012    Past Surgical History:  Procedure Laterality Date  . HAND SURGERY          Home Medications    Prior to Admission medications   Medication Sig Start Date End Date Taking? Authorizing Provider  amoxicillin-clavulanate (AUGMENTIN) 875-125 MG tablet Take 1 tablet by mouth 2 (two) times daily. One po bid x 7 days 10/08/18   Ripley Fraise, MD  Aspirin-Caffeine [facility] FAST PAIN RELIEF ARTHRITIS PO) Take 2 Packages by mouth as needed (arthritis pain).    [provider]  glipiZIDE (GLUCOTROL) 5 MG tablet Take 1 tablet (5 mg total) by mouth daily before breakfast. 10/17/18    Argentina Donovan, PA-C  HYDROcodone-acetaminophen (NORCO/VICODIN) 5-325 MG tablet Take 1 tablet by mouth every 6 (six) hours as needed for severe pain. 10/08/18   Ripley Fraise, MD  lisinopril-hydrochlorothiazide (ZESTORETIC) 20-12.5 MG tablet Take 1 tablet by mouth daily. 10/17/18   Argentina Donovan, PA-C  metFORMIN (GLUCOPHAGE) 1000 MG tablet Take 1 tablet (1,000 mg total) by mouth 2 (two) times daily. 10/17/18   Argentina Donovan, PA-C  Multiple Vitamins-Minerals (MULTIVITAMIN ADULT PO) Take 1 tablet by mouth daily.    [provider]  mupirocin ointment (BACTROBAN) 2 % Apply bid to wound for 10 dyas 10/17/18   Argentina Donovan, PA-C    Family History Family History  Family history unknown: Yes    Social History Social History   Tobacco Use  . Smoking status: Never Smoker  . Smokeless tobacco: Never Used  Substance Use Topics  . Alcohol use: Yes  . Drug use: No     Allergies   Patient has no known allergies.   Review of Systems Review of Systems  All other systems reviewed and are negative.    Physical Exam Updated Vital Signs BP 112/69   Pulse (!) 103   Temp (!) 97.4 F (36.3 C) (Oral)   Resp 17   Ht 1.918 m (6' 3.5")   Wt 108.9 kg   SpO2 100%   BMI 29.60 kg/m   Physical  Exam Vitals signs and nursing note reviewed.  Constitutional:      General: He is not in acute distress.    Appearance: He is well-developed.  HENT:     Head: Normocephalic and atraumatic.     Right Ear: External ear normal.     Left Ear: External ear normal.  Eyes:     General: No scleral icterus.       Right eye: No discharge.        Left eye: No discharge.     Conjunctiva/sclera: Conjunctivae normal.  Neck:     Musculoskeletal: Neck supple.     Trachea: No tracheal deviation.  Cardiovascular:     Rate and Rhythm: Tachycardia present. Rhythm irregularly irregular.  Pulmonary:     Effort: Pulmonary effort is normal. No respiratory distress.     Breath sounds:  Normal breath sounds. No stridor. No wheezing or rales.  Abdominal:     General: Bowel sounds are normal. There is no distension.     Palpations: Abdomen is soft.     Tenderness: There is no abdominal tenderness. There is no guarding or rebound.  Musculoskeletal:        General: No tenderness.  Feet:     Right foot:     Skin integrity: Ulcer, skin breakdown, erythema and warmth present.     Comments: Right foot with ulcerative wound around the lateral aspect proximal to the proximal metatarsal phalangeal joint that appears to be down to the extensor tendons, cyanosis of the fifth toe that is cool to touch, erythema and edema of the proximal foot extending up to his ankle, patient purulent drainage over the plantar aspect of the foot Skin:    General: Skin is warm and dry.     Findings: No rash.  Neurological:     Mental Status: He is alert.     Cranial Nerves: No cranial nerve deficit (no facial droop, extraocular movements intact, no slurred speech).     Sensory: No sensory deficit.     Motor: No abnormal muscle tone or seizure activity.     Coordination: Coordination normal.           ED Treatments / Results  Labs (all labs ordered are listed, but only abnormal results are displayed) Labs Reviewed  LACTIC ACID, PLASMA - Abnormal; Notable for the following components:      Result Value   Lactic Acid, Venous 4.0 (*)    All other components within normal limits  COMPREHENSIVE METABOLIC PANEL - Abnormal; Notable for the following components:   Sodium 130 (*)    Chloride 97 (*)    CO2 14 (*)    Glucose, Bld 271 (*)    BUN 76 (*)    Creatinine, Ser 3.15 (*)    Albumin 2.6 (*)    GFR calc non Af Amer 19 (*)    GFR calc Af Amer 22 (*)    Anion gap 19 (*)    All other components within normal limits  CBC WITH DIFFERENTIAL/PLATELET - Abnormal; Notable for the following components:   WBC 44.9 (*)    RBC 3.19 (*)    Hemoglobin 9.7 (*)    HCT 29.2 (*)    Neutro Abs 39.5 (*)     Monocytes Absolute 1.8 (*)    All other components within normal limits  SARS CORONAVIRUS 2  CULTURE, BLOOD (ROUTINE X 2)  CULTURE, BLOOD (ROUTINE X 2)  LACTIC ACID, PLASMA  URINALYSIS, ROUTINE W REFLEX MICROSCOPIC  EKG EKG Interpretation  Date/Time:  Tuesday November 13 2018 19:31:57 EDT Ventricular Rate:  105 PR Interval:    QRS Duration: 93 QT Interval:  342 QTC Calculation: 452 R Axis:   25 Text Interpretation:  Sinus tachycardia Ventricular premature complex RSR' in V1 or V2, probably normal variant a fib resolved since lasting Confirmed by Linwood DibblesKnapp, Lauraine Crespo [phone]([id number]) on 11/13/2018 7:37:05 PM   Radiology Dg Chest 1 View  Result Date: 11/13/2018 CLINICAL DATA:  Tachycardia. EXAM: CHEST  1 VIEW COMPARISON:  None. FINDINGS: The heart size and mediastinal contours are within normal limits. Both lungs are clear. The visualized skeletal structures are unremarkable. IMPRESSION: No active disease. Electronically Signed   By: Obie DredgeWilliam T Derry M.D.   On: 11/13/2018 19:44   Dg Foot Complete Right  Result Date: 11/13/2018 CLINICAL DATA:  Infection. Foot pain. EXAM: RIGHT FOOT COMPLETE - 3+ VIEW COMPARISON:  Radiograph 09/20/2018 FINDINGS: Soft tissue ulcer about the lateral aspect of the fifth metatarsal and metatarsal phalangeal joint with soft tissue air. No associated periosteal reaction or bony destruction. There is sclerosis about the adjacent sesamoid. Hammertoe deformity of the digits limits assessment. No acute fracture. There are vascular calcifications. IMPRESSION: Soft tissue ulcer about the fifth metatarsophalangeal joint with soft tissue air. No radiographic findings of osteomyelitis. Electronically Signed   By: Narda RutherfordMelanie  Sanford M.D.   On: 11/13/2018 19:48    Procedures .Critical Care Performed by: Linwood DibblesKnapp, Tiburcio Linder, MD Authorized by: Linwood DibblesKnapp, Jhase Creppel, MD   Critical care provider statement:    Critical care time (minutes):  45   Critical care was time spent personally by me on the following  activities:  Discussions with consultants, evaluation of patient's response to treatment, examination of patient, ordering and performing treatments and interventions, ordering and review of laboratory studies, ordering and review of radiographic studies, pulse oximetry, re-evaluation of patient's condition, obtaining history from patient or surrogate and review of old charts   (including critical care time)  Medications Ordered in ED Medications  vancomycin (VANCOCIN) 2,000 mg in sodium chloride 0.9 % 500 mL IVPB (2,000 mg Intravenous New Bag/Given 11/13/18 1848)  sodium chloride 0.9 % bolus 1,000 mL (0 mLs Intravenous Stopped 11/13/18 2001)    And  sodium chloride 0.9 % bolus 1,000 mL (1,000 mLs Intravenous New Bag/Given 11/13/18 1838)    And  sodium chloride 0.9 % bolus 1,000 mL (1,000 mLs Intravenous New Bag/Given 11/13/18 1841)    And  sodium chloride 0.9 % bolus 500 mL (500 mLs Intravenous New Bag/Given 11/13/18 2002)  cefTRIAXone (ROCEPHIN) 2 g in sodium chloride 0.9 % 100 mL IVPB (0 g Intravenous Stopped 11/13/18 1933)     Initial Impression / Assessment and Plan / ED Course  I have reviewed the triage vital signs and the nursing notes.  Pertinent labs & imaging results that were available during my care of the patient were reviewed by me and considered in my medical decision making (see chart for details).  Clinical Course as of Nov 13 2055  Tue Nov 13, 2018  1916 Patient's blood pressure is improving slightly.  He does remain tachycardic.  I will see if a low dose of Cardizem will slow down his heart rate and not affect his blood pressure adversely   [JK]  1918 Labs notable for difficult leukocytosis.  His anemia appears to be stable.   [JK]  1918 He also has an acute kidney injury with metabolic acidosis.  Lactic acid levels also elevated   [JK]  1918 Sepsis -  Repeat Assessment  Performed at:    8:13 PM   Vitals     @VITALSNOTREFRESHABLE @  Heart:     Regular rate and rhythm   Lungs:    CTA  Capillary Refill:   <2 sec  Peripheral Pulse:   Radial pulse palpable  Skin:     Normal Color       [JK]  2056 D/w Dr Carola FrostHandy.  Plan to contact Dr Lajoyce Cornersuda  who will evaluate pt in the AM.   [JK]    Clinical Course User Index [JK] Linwood DibblesKnapp, Zenia Guest, MD     Patient presented to the emergency room for evaluation of weakness.  Patient was initially noted to be significantly tachycardic.  He appeared to be in atrial fibrillation with a rapid rate.  With IV fluid hydration he spontaneously converted back to a sinus tachycardia.  Patient's blood pressure is also improved with hydration.  He was started IV antibiotics.  X-rays of his foot do not show evidence of osteomyelitis but he does clearly have gangrene of his fifth toe and I am concerned about the possibility of deep space infection of his foot.  I will consult with orthopedics.  Patient's acute kidney injury may be related to dehydration.  We will continue with IV fluid hydration and monitor closely.  I will consult with medical service for admission and close monitoring.   Final Clinical Impressions(s) / ED Diagnoses   Final diagnoses:  Infection  Gangrene of toe of right foot (HCC)  Cellulitis of right lower extremity  AKI (acute kidney injury) (HCC)  Atrial fibrillation, unspecified type (HCC)  Sepsis, due to unspecified organism, unspecified whether acute organ dysfunction present [facility](HCC)      Linwood DibblesKnapp, Keelia Graybill, MD 11/13/18 2057

## 2018-11-13 NOTE — ED Notes (Signed)
ED TO INPATIENT HANDOFF REPORT  ED Nurse Name and Phone #: [phone]  S Name/Age/Gender Joseph [name] 73 y.o. male Room/Bed: TRABC/TRABC  Code Status   Code Status: Full Code  Home/SNF/Other Home Patient oriented to: self, place, time and situation Is this baseline? Yes   Triage Complete: Triage complete  Chief Complaint Elevated HR  Triage Note Per EMS pt called EMS d/t weakness. Decreased PO intake and vomited 2 days ago. Wound to R leg, hx diabetes. Has been taking OTC meds for wound pain. HR found to be 160 with EMS. Denies chest pain.    Allergies No Known Allergies  Level of Care/Admitting Diagnosis ED Disposition    ED Disposition Condition [facility] Area: [place] [[id number]]  Level of Care: Progressive [102]  Covid Evaluation: Confirmed COVID Negative  Diagnosis: Sepsis with acute renal failure, due to unspecified organism, unspecified acute renal failure type, unspecified whether septic shock present [facility]) [[id number]]  Admitting Physician: Lenore Cordia [[id number]]  Attending Physician: Lenore Cordia [[id number]]  Estimated length of stay: past midnight tomorrow  Certification:: I certify this patient will need inpatient services for at least 2 midnights  PT Class (Do Not Modify): Inpatient [101]  PT Acc Code (Do Not Modify): Private [1]       B Medical/Surgery History Past Medical History:  Diagnosis Date  . Diabetes mellitus without complication ([place])   . Hypertension    Past Surgical History:  Procedure Laterality Date  . HAND SURGERY       A IV Location/Drains/Wounds Patient Lines/Drains/Airways Status   Active Line/Drains/Airways    Name:   Placement date:   Placement time:   Site:   Days:   Peripheral IV 11/13/18 Left Wrist   11/13/18    1824    Wrist   less than 1   Peripheral IV 11/13/18 Right Antecubital   11/13/18    1828    Antecubital   less than 1          Intake/Output Last 24 hours  Intake/Output  Summary (Last 24 hours) at 11/13/2018 2317 Last data filed at 11/13/2018 2214 Gross per 24 hour  Intake 6050 ml  Output -  Net 6050 ml    Labs/Imaging Results for orders placed or performed during the hospital encounter of 11/13/18 (from the past 48 hour(s))  Lactic acid, plasma     Status: Abnormal   Collection Time: 11/13/18  6:32 PM  Result Value Ref Range   Lactic Acid, Venous 4.0 (HH) 0.5 - 1.9 mmol/L    Comment: CRITICAL RESULT CALLED TO, READ BACK BY AND VERIFIED WITH: Roddie Mc 1904 11/13/2018 WBOND Performed at [facility], [place] [address] [place], [place] [address]   Comprehensive metabolic panel     Status: Abnormal   Collection Time: 11/13/18  6:32 PM  Result Value Ref Range   Sodium 130 (L) 135 - 145 mmol/L   Potassium 4.1 3.5 - 5.1 mmol/L   Chloride 97 (L) 98 - 111 mmol/L   CO2 14 (L) 22 - 32 mmol/L   Glucose, Bld 271 (H) 70 - 99 mg/dL   BUN 76 (H) 8 - 23 mg/dL   Creatinine, Ser 3.15 (H) 0.61 - 1.24 mg/dL   Calcium 9.0 8.9 - 10.3 mg/dL   Total Protein 7.7 6.5 - 8.1 g/dL   Albumin 2.6 (L) 3.5 - 5.0 g/dL   AST 32 15 - 41 U/L   ALT 22 0 -  44 U/L   Alkaline Phosphatase 78 38 - 126 U/L   Total Bilirubin 0.6 0.3 - 1.2 mg/dL   GFR calc non Af Amer 19 (L) >60 mL/min   GFR calc Af Amer 22 (L) >60 mL/min   Anion gap 19 (H) 5 - 15    Comment: Performed at [facility], [address] [place], [place] [address]  CBC WITH DIFFERENTIAL     Status: Abnormal   Collection Time: 11/13/18  6:32 PM  Result Value Ref Range   WBC 44.9 (H) 4.0 - 10.5 K/uL   RBC 3.19 (L) 4.22 - 5.81 MIL/uL   Hemoglobin 9.7 (L) 13.0 - 17.0 g/dL   HCT 60.429.2 (L) 54.039.0 - 98.152.0 %   MCV 91.5 80.0 - 100.0 fL   MCH 30.4 26.0 - 34.0 pg   MCHC 33.2 30.0 - 36.0 g/dL   RDW 19.113.0 47.811.5 - 29.515.5 %   Platelets 356 150 - 400 K/uL   nRBC 0.0 0.0 - 0.2 %   Neutrophils Relative % 88 %   Neutro Abs 39.5 (H) 1.7 - 7.7 K/uL   Lymphocytes Relative 8 %   Lymphs Abs 3.6 0.7 - 4.0 K/uL   Monocytes Relative  4 %   Monocytes Absolute 1.8 (H) 0.1 - 1.0 K/uL   Eosinophils Relative 0 %   Eosinophils Absolute 0.0 0.0 - 0.5 K/uL   Basophils Relative 0 %   Basophils Absolute 0.0 0.0 - 0.1 K/uL   nRBC 0 0 /100 WBC   Abs Immature Granulocytes 0.00 0.00 - 0.07 K/uL    Comment: Performed at [facility], [address] [place], [place] [address]  SARS Coronavirus 2     Status: None   Collection Time: 11/13/18  6:37 PM  Result Value Ref Range   SARS Coronavirus 2 NOT DETECTED NOT DETECTED    Comment: (NOTE) SARS-CoV-2 target nucleic acids are NOT DETECTED. The SARS-CoV-2 RNA is generally detectable in upper and lower respiratory specimens during the acute phase of infection.  Negative  results do not preclude SARS-CoV-2 infection, do not rule out co-infections with other pathogens, and should not be used as the sole basis for treatment or other patient management decisions.  Negative results must be combined with clinical observations, patient history, and epidemiological information. The expected result is Not Detected. Fact Sheet for Patients: http://www.biofiredefense.com/wp-content/uploads/2020/03/BIOFIRE-COVID -19-patients.pdf Fact Sheet for Healthcare Providers: http://www.biofiredefense.com/wp-content/uploads/2020/03/BIOFIRE-COVID -19-hcp.pdf This test is not yet approved or cleared by the [place] FDA and  has been authorized for detection and/or diagnosis of SARS-CoV-2 by FDA under an Emergency Use Authorization (EUA).  This EUA will remain in effec t (meaning this test can be used) for the duration of  the COVID-19 declaration under Section 564(b)(1) of the Act, 21 U.S.C. section 360bbb-3(b)(1), unless the authorization is terminated or revoked sooner. Performed at [facility], [address] [place], [place] [address]   CBG monitoring, ED     Status: Abnormal   Collection Time: 11/13/18 10:18 PM  Result Value Ref Range   Glucose-Capillary 192 (H) 70 - 99  mg/dL   Dg Chest 1 View  Result Date: 11/13/2018 CLINICAL DATA:  Tachycardia. EXAM: CHEST  1 VIEW COMPARISON:  None. FINDINGS: The heart size and mediastinal contours are within normal limits. Both lungs are clear. The visualized skeletal structures are unremarkable. IMPRESSION: No active disease. Electronically Signed   By: Obie DredgeWilliam T Derry M.D.   On: 11/13/2018 19:44   Dg Foot Complete Right  Result Date: 11/13/2018 CLINICAL DATA:  Infection. Foot pain. EXAM: RIGHT FOOT COMPLETE - 3+ VIEW COMPARISON:  Radiograph 09/20/2018 FINDINGS: Soft tissue ulcer about the lateral aspect of the fifth metatarsal and metatarsal phalangeal joint with soft tissue air. No associated periosteal reaction or bony destruction. There is sclerosis about the adjacent sesamoid. Hammertoe deformity of the digits limits assessment. No acute fracture. There are vascular calcifications. IMPRESSION: Soft tissue ulcer about the fifth metatarsophalangeal joint with soft tissue air. No radiographic findings of osteomyelitis. Electronically Signed   By: Narda RutherfordMelanie  Sanford M.D.   On: 11/13/2018 19:48    Pending Labs Unresulted Labs (From admission, onward)    Start     Ordered   11/14/18 0500  CBC  Tomorrow morning,   R     11/13/18 2129   11/14/18 0500  Basic metabolic panel  Tomorrow morning,   R     11/13/18 2129   11/14/18 0500  Lipid panel  Tomorrow morning,   R     11/13/18 2130   11/13/18 1829  Lactic acid, plasma  Now then every 2 hours,   STAT     11/13/18 1829   11/13/18 1829  Blood Culture (routine x 2)  BLOOD CULTURE X 2,   STAT     11/13/18 1829   11/13/18 1829  Urinalysis, Routine w reflex microscopic  ONCE - STAT,   STAT     11/13/18 1829          Vitals/Pain Today's Vitals   11/13/18 2045 11/13/18 2100 11/13/18 2145 11/13/18 2215  BP: 105/64 95/70 101/70 118/70  Pulse: 89 89 86 (!) 102  Resp: (!) 22 17 14  (!) 28  Temp:      TempSrc:      SpO2: 100% 100% 100% 100%  Weight:      Height:       PainSc:        Isolation Precautions No active isolations  Medications Medications  sodium chloride flush (NS) 0.9 % injection 3 mL (3 mLs Intravenous Given 11/13/18 2222)  lactated ringers infusion ( Intravenous New Bag/Given 11/13/18 2220)  acetaminophen (TYLENOL) tablet 650 mg (has no administration in time range)    Or  acetaminophen (TYLENOL) suppository 650 mg (has no administration in time range)  oxyCODONE (Oxy IR/ROXICODONE) immediate release tablet 5 mg (has no administration in time range)  ondansetron (ZOFRAN) tablet 4 mg (has no administration in time range)    Or  ondansetron (ZOFRAN) injection 4 mg (has no administration in time range)  insulin aspart (novoLOG) injection 0-15 Units (has no administration in time range)  insulin aspart (novoLOG) injection 0-5 Units (0 Units Subcutaneous Not Given 11/13/18 2222)  cefTRIAXone (ROCEPHIN) 2 g in sodium chloride 0.9 % 100 mL IVPB (has no administration in time range)  HYDROmorphone (DILAUDID) injection 0.5 mg (has no administration in time range)  cefTRIAXone (ROCEPHIN) 2 g in sodium chloride 0.9 % 100 mL IVPB (0 g Intravenous Stopped 11/13/18 1933)  vancomycin (VANCOCIN) 2,000 mg in sodium chloride 0.9 % 500 mL IVPB (0 mg Intravenous Stopped 11/13/18 2141)  sodium chloride 0.9 % bolus 1,000 mL (0 mLs Intravenous Stopped 11/13/18 2001)    And  sodium chloride 0.9 % bolus 1,000 mL (0 mLs Intravenous Stopped 11/13/18 2037)    And  sodium chloride 0.9 % bolus 1,000 mL (0 mLs Intravenous Stopped 11/13/18 2214)    And  sodium chloride 0.9 % bolus 500 mL (0 mLs Intravenous Stopped 11/13/18 2037)  Mobility walks with person assist Moderate fall risk   Focused Assessments Cardiac Assessment Handoff:  Cardiac Rhythm: Sinus tachycardia No results found for: CKTOTAL, CKMB, CKMBINDEX, TROPONINI No results found for: DDIMER Does the Patient currently have chest pain? No      R Recommendations: See Admitting Provider  Note  Report given to:   Additional Notes:

## 2018-11-13 NOTE — Progress Notes (Signed)
Pharmacy Antibiotic Note  Joseph [name] is a 73 y.o. male admitted on 11/13/2018 with sepsis and cellulitis.  Pharmacy has been consulted for vancomycin dosing. Patient hypotensive with MAP 68, fluids ordered. Tmax 97.4, lactate 4, WBC 44. Patient with AKI Scr 3.15 (baseline ~1.3-1.5).  Plan: Ceftriaxone 2g x1 per MD Vancomycin 2000mg  x1 loading dose Follow up further vancomycin dosing dependent on AKI resolution Monitor renal function, culture data, LOT  Height: 6' 3.5" (191.8 cm) Weight: 240 lb (108.9 kg) IBW/kg (Calculated) : 85.65  Temp (24hrs), Avg:97.4 F (36.3 C), Min:97.4 F (36.3 C), Max:97.4 F (36.3 C)  No results for input(s): WBC, CREATININE, LATICACIDVEN, VANCOTROUGH, VANCOPEAK, VANCORANDOM, GENTTROUGH, GENTPEAK, GENTRANDOM, TOBRATROUGH, TOBRAPEAK, TOBRARND, AMIKACINPEAK, AMIKACINTROU, AMIKACIN in the last 168 hours.  CrCl cannot be calculated (Patient's most recent lab result is older than the maximum 21 days allowed.).    No Known Allergies  Antimicrobials this admission: CTX x1 Vanc 6/16 >>  BCx ordered, labs pending.  Thank you for allowing pharmacy to be a part of this patient's care.  Janae Bridgeman, PharmD PGY1 Pharmacy Resident Phone: [phone] 11/13/2018 6:32 PM

## 2018-11-13 NOTE — Progress Notes (Signed)
Consult received.   Given the nature, extent, and duration of the wound as well as the patient's comorbidities, I have asked my colleague, Dr. Meridee Score, for his assistance with further assessment and management given his expertise and experience in this domain. He or I will see the patient tomorrow.  Joseph Nassawadox, MD [facility], [facility] [phone]

## 2018-11-14 ENCOUNTER — Encounter (HOSPITAL_COMMUNITY): Payer: Self-pay | Admitting: *Deleted

## 2018-11-14 ENCOUNTER — Other Ambulatory Visit: Payer: Self-pay

## 2018-11-14 ENCOUNTER — Inpatient Hospital Stay (HOSPITAL_COMMUNITY): Payer: Medicare Other

## 2018-11-14 DIAGNOSIS — I998 Other disorder of circulatory system: Secondary | ICD-10-CM

## 2018-11-14 DIAGNOSIS — N179 Acute kidney failure, unspecified: Secondary | ICD-10-CM

## 2018-11-14 DIAGNOSIS — I70229 Atherosclerosis of native arteries of extremities with rest pain, unspecified extremity: Secondary | ICD-10-CM

## 2018-11-14 DIAGNOSIS — I96 Gangrene, not elsewhere classified: Secondary | ICD-10-CM

## 2018-11-14 DIAGNOSIS — E1142 Type 2 diabetes mellitus with diabetic polyneuropathy: Secondary | ICD-10-CM

## 2018-11-14 DIAGNOSIS — R6520 Severe sepsis without septic shock: Secondary | ICD-10-CM

## 2018-11-14 DIAGNOSIS — I739 Peripheral vascular disease, unspecified: Secondary | ICD-10-CM

## 2018-11-14 DIAGNOSIS — A419 Sepsis, unspecified organism: Principal | ICD-10-CM

## 2018-11-14 LAB — LIPID PANEL
Cholesterol: 61 mg/dL (ref 0–200)
HDL: 13 mg/dL — ABNORMAL LOW (ref >40)
LDL Cholesterol: 39 mg/dL (ref 0–99)
Total CHOL/HDL Ratio: 4.7 RATIO
Triglycerides: 44 mg/dL (ref <150)
VLDL: 9 mg/dL (ref 0–40)

## 2018-11-14 LAB — URINALYSIS, ROUTINE W REFLEX MICROSCOPIC
Bilirubin Urine: NEGATIVE
Glucose, UA: NEGATIVE mg/dL
Hgb urine dipstick: NEGATIVE
Ketones, ur: NEGATIVE mg/dL
Leukocytes,Ua: NEGATIVE
Nitrite: NEGATIVE
Protein, ur: NEGATIVE mg/dL
Specific Gravity, Urine: 1.015 (ref 1.005–1.030)
pH: 5 (ref 5.0–8.0)

## 2018-11-14 LAB — CBC
HCT: 19.7 % — ABNORMAL LOW (ref 39.0–52.0)
HCT: 28.6 % — ABNORMAL LOW (ref 39.0–52.0)
Hemoglobin: 6.7 g/dL — CL (ref 13.0–17.0)
Hemoglobin: 9.5 g/dL — ABNORMAL LOW (ref 13.0–17.0)
MCH: 30.1 pg (ref 26.0–34.0)
MCH: 30.7 pg (ref 26.0–34.0)
MCHC: 33.2 g/dL (ref 30.0–36.0)
MCHC: 34 g/dL (ref 30.0–36.0)
MCV: 90.4 fL (ref 80.0–100.0)
MCV: 90.5 fL (ref 80.0–100.0)
Platelets: 251 10*3/uL (ref 150–400)
Platelets: 321 10*3/uL (ref 150–400)
RBC: 2.18 MIL/uL — ABNORMAL LOW (ref 4.22–5.81)
RBC: 3.16 MIL/uL — ABNORMAL LOW (ref 4.22–5.81)
RDW: 13.1 % (ref 11.5–15.5)
RDW: 13.6 % (ref 11.5–15.5)
WBC: 35.3 10*3/uL — ABNORMAL HIGH (ref 4.0–10.5)
WBC: 36.6 10*3/uL — ABNORMAL HIGH (ref 4.0–10.5)
nRBC: 0 % (ref 0.0–0.2)
nRBC: 0 % (ref 0.0–0.2)

## 2018-11-14 LAB — RETICULOCYTES
Immature Retic Fract: 15.8 % (ref 2.3–15.9)
RBC.: 3.16 MIL/uL — ABNORMAL LOW (ref 4.22–5.81)
Retic Count, Absolute: 37.6 10*3/uL (ref 19.0–186.0)
Retic Ct Pct: 1.2 % (ref 0.4–3.1)

## 2018-11-14 LAB — FERRITIN: Ferritin: 554 ng/mL — ABNORMAL HIGH (ref 24–336)

## 2018-11-14 LAB — BASIC METABOLIC PANEL
Anion gap: 11 (ref 5–15)
BUN: 68 mg/dL — ABNORMAL HIGH (ref 8–23)
CO2: 17 mmol/L — ABNORMAL LOW (ref 22–32)
Calcium: 8 mg/dL — ABNORMAL LOW (ref 8.9–10.3)
Chloride: 107 mmol/L (ref 98–111)
Creatinine, Ser: 2.35 mg/dL — ABNORMAL HIGH (ref 0.61–1.24)
GFR calc Af Amer: 31 mL/min — ABNORMAL LOW (ref >60)
GFR calc non Af Amer: 27 mL/min — ABNORMAL LOW (ref >60)
Glucose, Bld: 225 mg/dL — ABNORMAL HIGH (ref 70–99)
Potassium: 4 mmol/L (ref 3.5–5.1)
Sodium: 135 mmol/L (ref 135–145)

## 2018-11-14 LAB — IRON AND TIBC
Iron: 78 ug/dL (ref 45–182)
Saturation Ratios: 43 % — ABNORMAL HIGH (ref 17.9–39.5)
TIBC: 183 ug/dL — ABNORMAL LOW (ref 250–450)
UIBC: 105 ug/dL

## 2018-11-14 LAB — VANCOMYCIN, RANDOM: Vancomycin Rm: 13

## 2018-11-14 LAB — CREATININE, SERUM
Creatinine, Ser: 1.6 mg/dL — ABNORMAL HIGH (ref 0.61–1.24)
GFR calc Af Amer: 49 mL/min — ABNORMAL LOW (ref >60)
GFR calc non Af Amer: 43 mL/min — ABNORMAL LOW (ref >60)

## 2018-11-14 LAB — GLUCOSE, CAPILLARY
Glucose-Capillary: 188 mg/dL — ABNORMAL HIGH (ref 70–99)
Glucose-Capillary: 148 mg/dL — ABNORMAL HIGH (ref 70–99)
Glucose-Capillary: 190 mg/dL — ABNORMAL HIGH (ref 70–99)
Glucose-Capillary: 205 mg/dL — ABNORMAL HIGH (ref 70–99)

## 2018-11-14 LAB — VITAMIN B12: Vitamin B-12: 333 pg/mL (ref 180–914)

## 2018-11-14 LAB — PREPARE RBC (CROSSMATCH)

## 2018-11-14 LAB — PATHOLOGIST SMEAR REVIEW

## 2018-11-14 LAB — LACTIC ACID, PLASMA
Lactic Acid, Venous: 0.4 mmol/L — ABNORMAL LOW (ref 0.5–1.9)
Lactic Acid, Venous: 6.6 mmol/L (ref 0.5–1.9)

## 2018-11-14 LAB — FOLATE: Folate: 21.2 ng/mL (ref >5.9)

## 2018-11-14 LAB — ABO/RH: ABO/RH(D): A POS

## 2018-11-14 IMAGING — MR MRI OF THE RIGHT FOREFOOT WITHOUT CONTRAST
4 of 5 series · 25 of 40 positions shown · non-contrast
Comparison: Plain films right foot [DATE], [DATE] and
[DATE].

CLINICAL DATA: Diabetic patient with a skin ulceration lateral to
the fifth metatarsal for approximately 2 months after the patient
scraped his foot while doing tree work. Subsequent encounter.

EXAM:
MRI OF THE RIGHT FOREFOOT WITHOUT CONTRAST
TECHNIQUE: Multiplanar, multisequence MR imaging of the right forefoot was
performed. No intravenous contrast was administered.

[Series 4: T1 · coronal · 3.0mm · 0.29mm/px · 8 of 65 slices shown (1 of 2)]
[im 1/65]
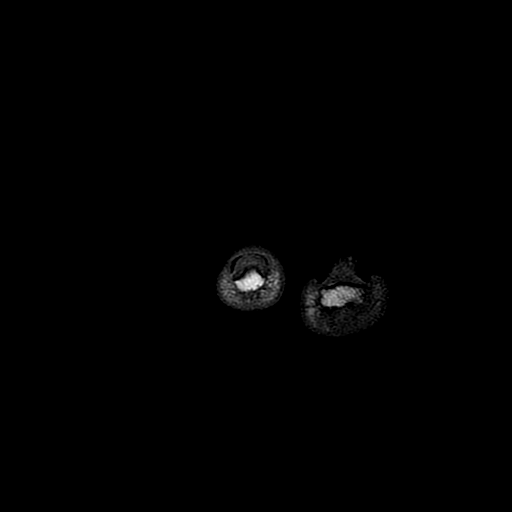
[im 12/65]
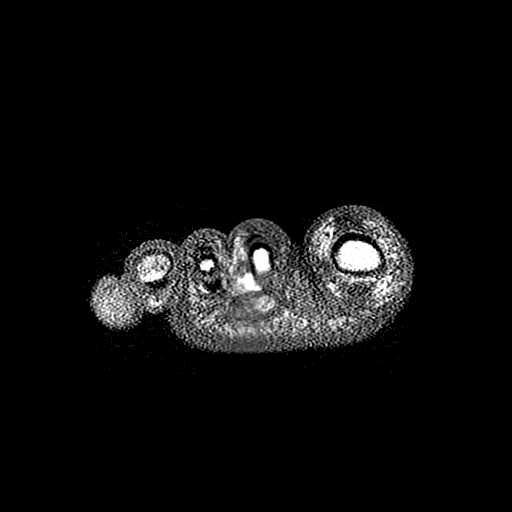
[im 18/65]
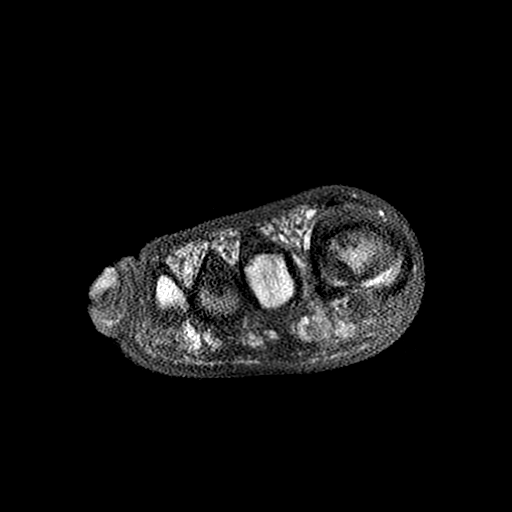
[im 30/65]
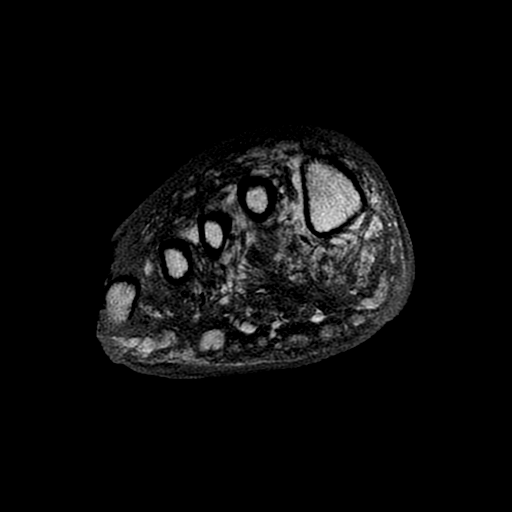
[im 35/65]
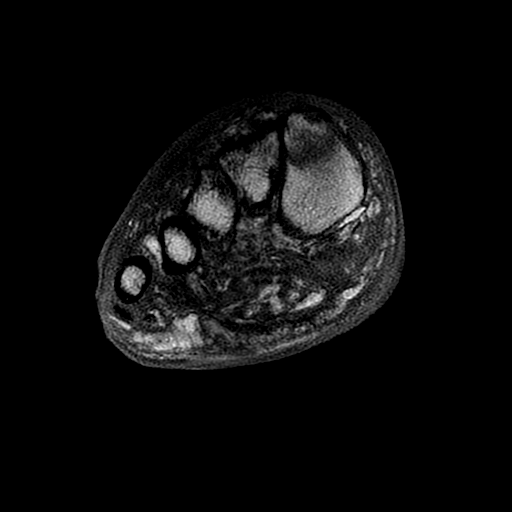
[im 47/65]
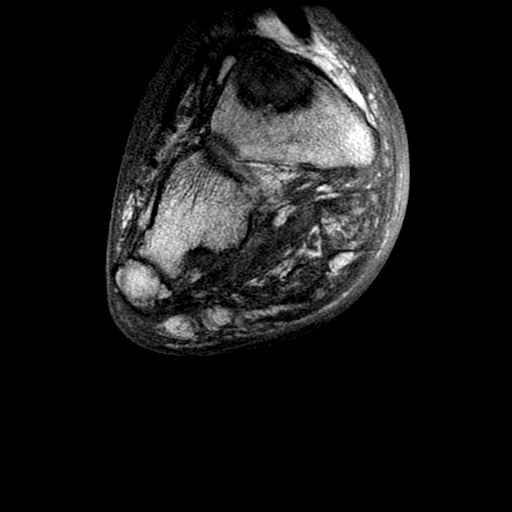
[im 53/65]
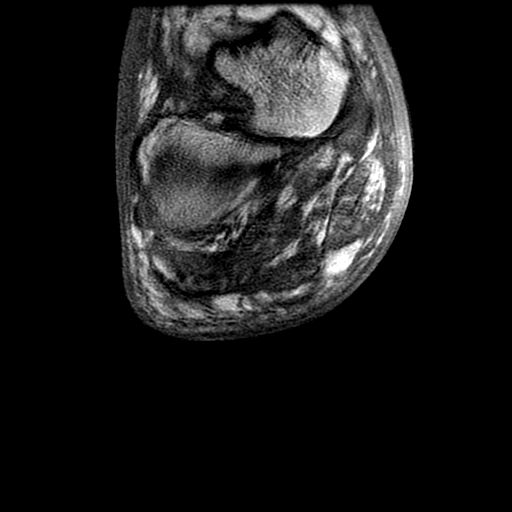
[im 59/65]
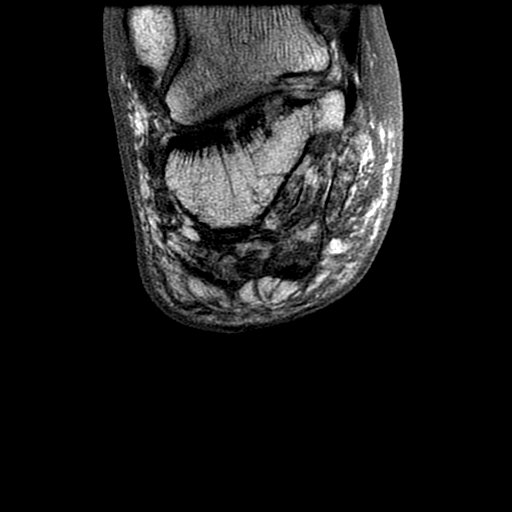

[Series 5: T2 fat-sat · coronal · 3.0mm · 0.59mm/px · 9 of 65 slices shown (1 of 2)]
[im 1/65]
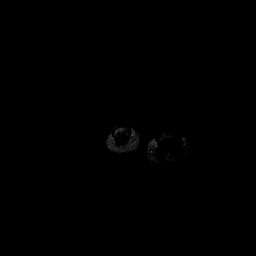
[im 12/65]
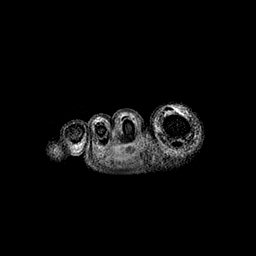
[im 18/65]
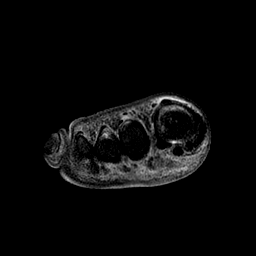
[im 30/65]
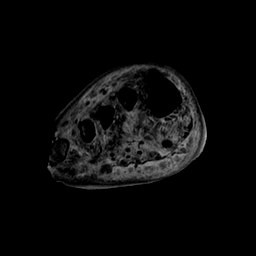
[im 35/65]
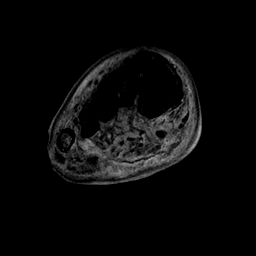
[im 47/65]
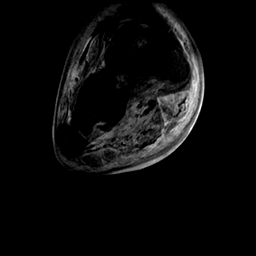
[im 53/65]
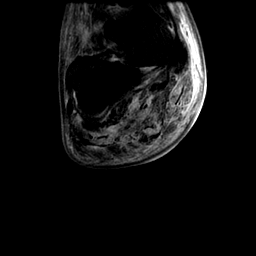
[im 59/65]
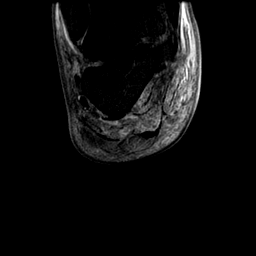
[im 65/65]
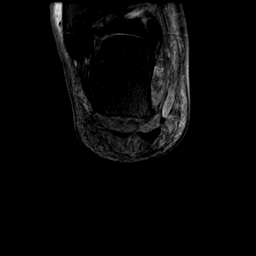

[Series 6: T1 · axial · 3.0mm · 0.45mm/px · z∈[-98,-14]mm · 3 of 26 slices shown (2 of 2)]
[im 1/26]
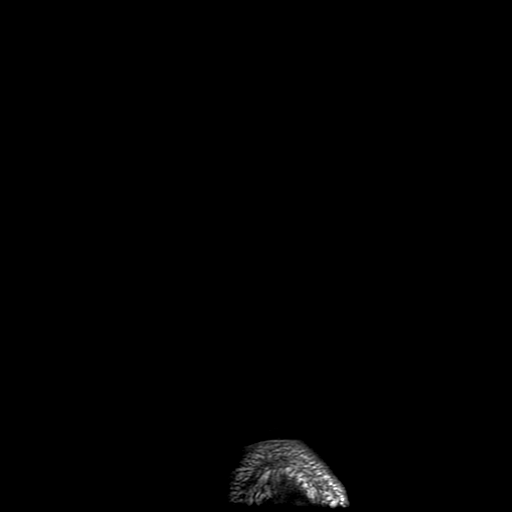
[im 13/26]
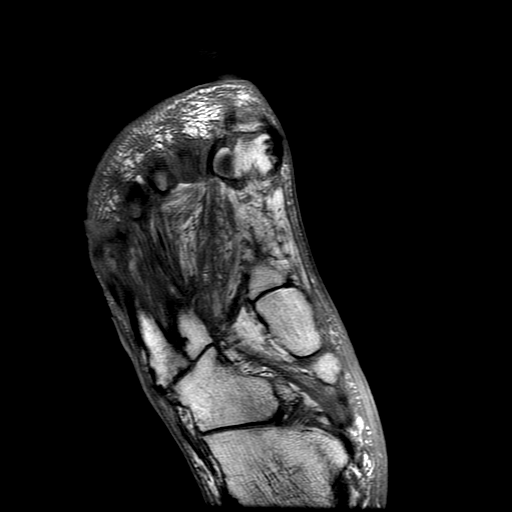
[im 26/26]
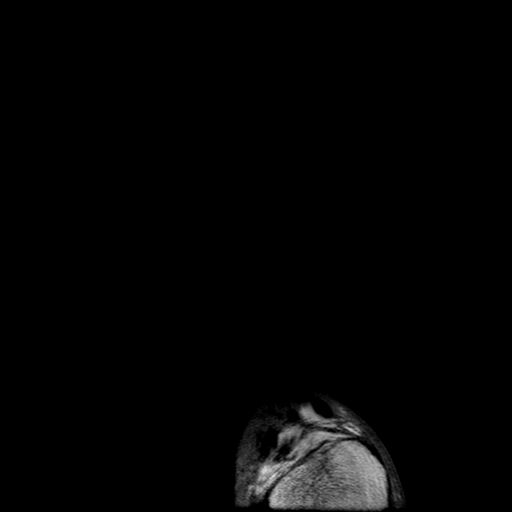

[Series 7: T2 fat-sat · axial · 3.0mm · 0.45mm/px · z∈[-96,-12]mm · 5 of 26 slices shown (2 of 2)]
[im 1/26]
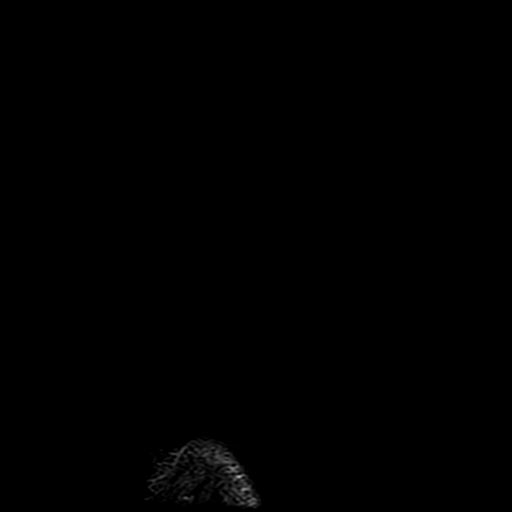
[im 7/26]
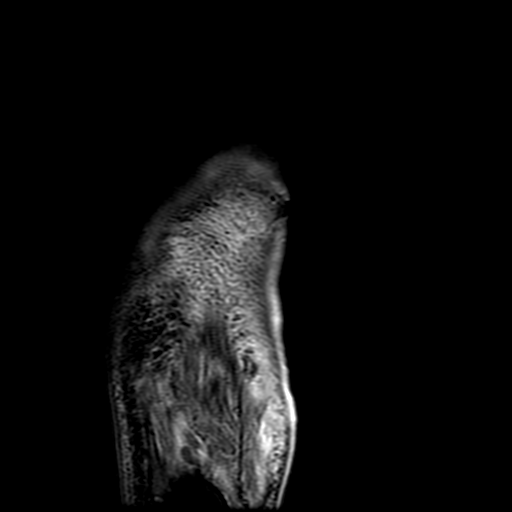
[im 13/26]
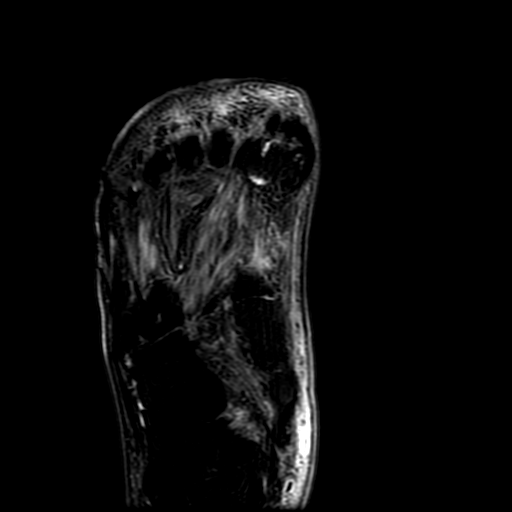
[im 19/26]
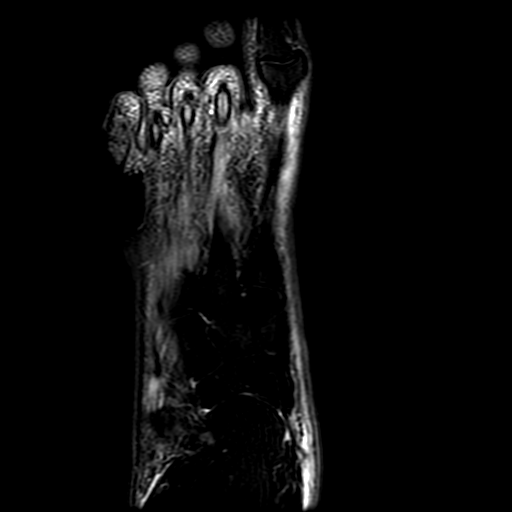
[im 26/26]
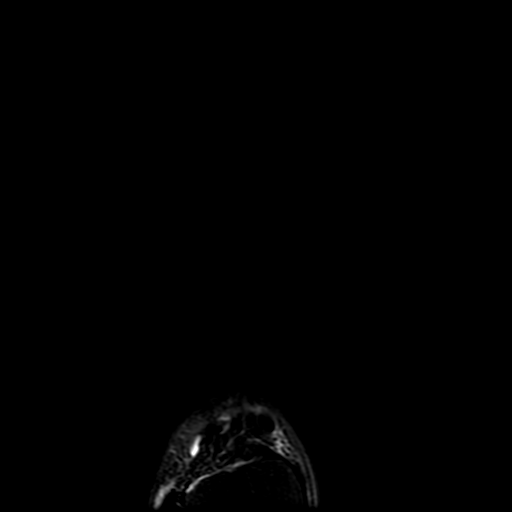

[25 of 40 positions shown; findings below may reference images not displayed]

FINDINGS: Bones/Joint/Cartilage

There is edema in the distal 4 cm of the fifth metatarsal and
throughout the fifth toe consistent with osteomyelitis. Gas is seen
in the fifth metatarsal head. Bone marrow signal is otherwise
unremarkable.

Ligaments

Intact.

Muscles and Tendons

Intact.  No intramuscular fluid collection.

Soft tissues

A large skin ulceration over the distal fifth metatarsal and
proximal fifth toe is identified. Bone appears exposed. No abscess.
IMPRESSION: Large skin ulceration along the lateral aspect of the distal fifth
metatarsal proximal fifth toe with findings consistent with
osteomyelitis in the distal 4 cm of the fifth metatarsal and
throughout the fifth toe. Gas in the fifth metatarsal head
consistent with emphysematous osteomyelitis noted.

Negative for abscess or septic joint.

## 2018-11-14 MED ORDER — VANCOMYCIN VARIABLE DOSE PER UNSTABLE RENAL FUNCTION (PHARMACIST DOSING): Status: DC

## 2018-11-14 MED ORDER — VANCOMYCIN HCL 10 G IV SOLR
1250.0000 mg | INTRAVENOUS | Status: DC
  Administered 2018-11-16 (×2): 1250 mg via INTRAVENOUS
  Filled 2018-11-14 (×3): qty 1250

## 2018-11-14 MED ORDER — VANCOMYCIN HCL 10 G IV SOLR
1500.0000 mg | Freq: Once | INTRAVENOUS | Status: AC
  Administered 2018-11-14: 1500 mg via INTRAVENOUS
  Filled 2018-11-14: qty 1500

## 2018-11-14 MED ORDER — VANCOMYCIN HCL 10 G IV SOLR
2000.0000 mg | Freq: Once | INTRAVENOUS | Status: DC
  Filled 2018-11-14: qty 2000

## 2018-11-14 MED ORDER — SODIUM CHLORIDE 0.9 % IV SOLN
INTRAVENOUS | Status: AC
  Administered 2018-11-14: 04:00:00 via INTRAVENOUS

## 2018-11-14 MED ORDER — SODIUM CHLORIDE 0.9 % IV BOLUS
1000.0000 mL | Freq: Once | INTRAVENOUS | Status: AC
  Administered 2018-11-14: 1000 mL via INTRAVENOUS

## 2018-11-14 MED ORDER — PANTOPRAZOLE SODIUM 40 MG IV SOLR
40.0000 mg | Freq: Two times a day (BID) | INTRAVENOUS | Status: DC
  Administered 2018-11-14 – 2018-11-17 (×7): 40 mg via INTRAVENOUS
  Filled 2018-11-14 (×8): qty 40

## 2018-11-14 MED ORDER — SODIUM CHLORIDE 0.9% IV SOLUTION: Freq: Once | INTRAVENOUS | Status: DC

## 2018-11-14 NOTE — Progress Notes (Signed)
PROGRESS NOTE    Joseph Tomaselli  ZOX:[id number] DOB: April 29, 1946 DOA: 11/13/2018 PCP: Patient, No Pcp Per    Brief Narrative: Joseph [name] is a 73 y.o. male with medical history significant for type 2 diabetes, hypertension, CKD stage II who presents to the ED for evaluation of right foot wound that has been ongoing for the last 2 months.  Patient first noticed a wound to his right foot about 2 months ago.  He noticed a small laceration to his right dorsal foot after doing yard work.  He was seen in the ED initially on 09/12/2018 and treated with a 5-day course of doxycycline for cellulitis and superficial ulcer.  He was seen again on 09/20/2018 in the ED and switched to oral clindamycin.  He was seen for follow-up wound check in the ED on 09/23/2018 at which time it was felt his wound was improving.  He was seen again on 10/08/2018 and the wound appeared to be healing with chronic necrotic appearance.  He was given a prescription for oral Augmentin.  He was seen by community health and wellness to establish care on 10/17/2018 during which time he felt his foot pain and wound was improving.  He was given oral Augmentin and referred to the wound care clinic.   Assessment & Plan:   Principal Problem:   Sepsis with acute renal failure (HCC) Active Problems:   DM (diabetes mellitus) (HCC)   Hypertension associated with diabetes (HCC)   Acute renal failure superimposed on stage 2 chronic kidney disease (HCC)   Open wound of right foot   Gangrene of right foot (HCC)   Diabetic polyneuropathy associated with type 2 diabetes mellitus (HCC)   PVD (peripheral vascular disease) (HCC)   Critical lower limb ischemia  Sepsis secondary to right foot infection Continue with broad-spectrum antibiotics. Orthopedics consulted and recommendations given. We will request vascular surgery consult in a.m. for the left foot. Follow blood cultures.  ABIs to assess peripheral vascular disease. Afebrile and leukocytosis  is improving.     Acute on stage II CKD with metabolic acidosis Improving with fluid resuscitation. Hold nephrotoxins. Avoid NSAIDS    Type 2 DM CBG (last 3)  Recent Labs    11/14/18 0839 11/14/18 1153 11/14/18 1731  GLUCAP 188* 148* 190*   Resume SSI.    Anemia of blood loss vs anemia of chronic disease:  S/p 1 UNIT  ofprbc transfusion.  Repeat H&H is around 9.  GI consulted to see if EGD / colonoscopy can be done. Patient is refusing at this time.     Hypertension: Well controlled.    DVT prophylaxis:scd's Code Status: full code.  Family Communication: none at bedside.  Disposition Plan: pending clinical improvement.   Consultants:   Gastroenterology.   Orthopedics.   vascular surgery.   Procedures: none.  Antimicrobials: rocephin    Subjective: WANTS TO eat.   Objective: Vitals:   11/14/18 0427 11/14/18 0647 11/14/18 0845 11/14/18 1425  BP: 108/64 127/77 133/80 106/85  Pulse: 81 87 80 86  Resp: 16     Temp: 98.1 F (36.7 C) 98.2 F (36.8 C)  98.6 F (37 C)  TempSrc: Oral Oral  Oral  SpO2: 100% 99% 100% 100%  Weight:      Height:        Intake/Output Summary (Last 24 hours) at 11/14/2018 1753 Last data filed at 11/14/2018 1529 Gross per 24 hour  Intake 8111.51 ml  Output 1800 ml  Net 6311.51 ml   Filed  Weights   11/13/18 1825 11/13/18 2357  Weight: 108.9 kg 108.9 kg    Examination:  General exam: Appears calm and comfortable  Respiratory system: Clear to auscultation. Respiratory effort normal. Cardiovascular system: S1 & S2 heard, RRR. No JVD,  Gastrointestinal system: Abdomen is nondistended, soft and nontender. No organomegaly or masses felt. Normal bowel sounds heard. Central nervous system: Alert and oriented. No focal neurological deficits. Extremities: Symmetric 5 x 5 power. Skin: No rashes, lesions or ulcers Psychiatry:  Mood & affect appropriate.     Data Reviewed: I have personally reviewed following labs and  imaging studies  CBC: Recent Labs  Lab 11/13/18 1832 11/14/18 0006 11/14/18 1141  WBC 44.9* 35.3* 36.6*  NEUTROABS 39.5*  --   --   HGB 9.7* 6.7* 9.5*  HCT 29.2* 19.7* 28.6*  MCV 91.5 90.4 90.5  PLT 356 251 321   Basic Metabolic Panel: Recent Labs  Lab 11/13/18 1832 11/14/18 0006  NA 130* 135  K 4.1 4.0  CL 97* 107  CO2 14* 17*  GLUCOSE 271* 225*  BUN 76* 68*  CREATININE 3.15* 2.35*  CALCIUM 9.0 8.0*   GFR: Estimated Creatinine Clearance: 39 mL/min (A) (by C-G formula based on SCr of 2.35 mg/dL (H)). Liver Function Tests: Recent Labs  Lab 11/13/18 1832  AST 32  ALT 22  ALKPHOS 78  BILITOT 0.6  PROT 7.7  ALBUMIN 2.6*   No results for input(s): LIPASE, AMYLASE in the last 168 hours. No results for input(s): AMMONIA in the last 168 hours. Coagulation Profile: No results for input(s): INR, PROTIME in the last 168 hours. Cardiac Enzymes: No results for input(s): CKTOTAL, CKMB, CKMBINDEX, TROPONINI in the last 168 hours. BNP (last 3 results) No results for input(s): PROBNP in the last 8760 hours. HbA1C: No results for input(s): HGBA1C in the last 72 hours. CBG: Recent Labs  Lab 11/13/18 2218 11/13/18 2354 11/14/18 0839 11/14/18 1153 11/14/18 1731  GLUCAP 192* 203* 188* 148* 190*   Lipid Profile: Recent Labs    11/14/18 0006  CHOL 61  HDL 13*  LDLCALC 39  TRIG 44  CHOLHDL 4.7   Thyroid Function Tests: No results for input(s): TSH, T4TOTAL, FREET4, T3FREE, THYROIDAB in the last 72 hours. Anemia Panel: Recent Labs    11/14/18 1141  VITAMINB12 333  FOLATE 21.2  FERRITIN 554*  TIBC 183*  IRON 78  RETICCTPCT 1.2   Sepsis Labs: Recent Labs  Lab 11/13/18 1832 11/14/18 0006 11/14/18 0236  LATICACIDVEN 4.0* 6.6* 0.4*    Recent Results (from the past 240 hour(s))  Blood Culture (routine x 2)     Status: None (Preliminary result)   Collection Time: 11/13/18  6:33 PM   Specimen: BLOOD  Result Value Ref Range Status   Specimen Description  BLOOD RIGHT ANTECUBITAL  Final   Special Requests   Final    BOTTLES DRAWN AEROBIC AND ANAEROBIC Blood Culture adequate volume   Culture   Final    NO GROWTH < 24 HOURS Performed at [facility], [address] [place], [place] [address]    Report Status PENDING  Incomplete  SARS Coronavirus 2     Status: None   Collection Time: 11/13/18  6:37 PM  Result Value Ref Range Status   SARS Coronavirus 2 NOT DETECTED NOT DETECTED Final    Comment: (NOTE) SARS-CoV-2 target nucleic acids are NOT DETECTED. The SARS-CoV-2 RNA is generally detectable in upper and lower respiratory specimens during the acute phase of infection.  Negative  results do not preclude SARS-CoV-2 infection, do not rule out co-infections with other pathogens, and should not be used as the sole basis for treatment or other patient management decisions.  Negative results must be combined with clinical observations, patient history, and epidemiological information. The expected result is Not Detected. Fact Sheet for Patients: http://www.biofiredefense.com/wp-content/uploads/2020/03/BIOFIRE-COVID -19-patients.pdf Fact Sheet for Healthcare Providers: http://www.biofiredefense.com/wp-content/uploads/2020/03/BIOFIRE-COVID -19-hcp.pdf This test is not yet approved or cleared by the [place] FDA and  has been authorized for detection and/or diagnosis of SARS-CoV-2 by FDA under an Emergency Use Authorization (EUA).  This EUA will remain in effec t (meaning this test can be used) for the duration of  the COVID-19 declaration under Section 564(b)(1) of the Act, 21 U.S.C. section 360bbb-3(b)(1), unless the authorization is terminated or revoked sooner. Performed at [facility], [address] [place], [place] [address]   Blood Culture (routine x 2)     Status: None (Preliminary result)   Collection Time: 11/13/18  6:48 PM   Specimen: BLOOD LEFT ARM  Result Value Ref Range Status   Specimen Description  BLOOD LEFT ARM  Final   Special Requests   Final    BOTTLES DRAWN AEROBIC AND ANAEROBIC Blood Culture adequate volume   Culture   Final    NO GROWTH < 24 HOURS Performed at [facility], [address] [place], [place] [address]    Report Status PENDING  Incomplete         Radiology Studies: Dg Chest 1 View  Result Date: 11/13/2018 CLINICAL DATA:  Tachycardia. EXAM: CHEST  1 VIEW COMPARISON:  None. FINDINGS: The heart size and mediastinal contours are within normal limits. Both lungs are clear. The visualized skeletal structures are unremarkable. IMPRESSION: No active disease. Electronically Signed   By: Obie DredgeWilliam T Derry M.D.   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 Subsequent encounter. EXAM: MRI OF THE RIGHT FOREFOOT WITHOUT CONTRAST TECHNIQUE: Multiplanar, multisequence MR imaging of the right forefoot was performed. No intravenous contrast was administered. COMPARISON:  Plain films right foot 09/12/2018, 09/20/2018 and 11/13/2018. FINDINGS: Bones/Joint/Cartilage There is edema in the distal 4 cm of the fifth metatarsal and throughout the fifth toe consistent with osteomyelitis. Gas is seen in the fifth metatarsal head. Bone marrow signal is otherwise unremarkable. Ligaments Intact. Muscles and Tendons Intact.  No intramuscular fluid collection. Soft tissues A large skin ulceration over the distal fifth metatarsal and proximal fifth toe is identified. Bone appears exposed. No abscess. IMPRESSION: Large skin ulceration along the lateral aspect of the distal fifth metatarsal proximal fifth toe with findings consistent with osteomyelitis in the distal 4 cm of the fifth metatarsal and throughout the fifth toe. Gas in the fifth metatarsal head consistent with emphysematous osteomyelitis  noted. Negative for abscess or septic joint. Electronically Signed   By: Drusilla Kannerhomas  Dalessio M.D.   On: 11/14/2018 12:29   Dg Foot Complete Right  Result Date: 11/13/2018 CLINICAL DATA:  Infection. Foot pain. EXAM: RIGHT FOOT COMPLETE - 3+ VIEW COMPARISON:  Radiograph 09/20/2018 FINDINGS: Soft tissue ulcer about the lateral aspect of the fifth metatarsal and metatarsal phalangeal joint with soft tissue air. No associated periosteal reaction or bony destruction. There is sclerosis about the adjacent sesamoid. Hammertoe deformity of the digits limits assessment. No acute fracture.  There are vascular calcifications. IMPRESSION: Soft tissue ulcer about the fifth metatarsophalangeal joint with soft tissue air. No radiographic findings of osteomyelitis. Electronically Signed   By: Keith Rake M.D.   On: 11/13/2018 19:48        Scheduled Meds:  sodium chloride   Intravenous Once   insulin aspart  0-15 Units Subcutaneous TID WC   insulin aspart  0-5 Units Subcutaneous QHS   pantoprazole (PROTONIX) IV  40 mg Intravenous Q12H   sodium chloride flush  3 mL Intravenous Q12H   vancomycin variable dose per unstable renal function (pharmacist dosing)   Does not apply See admin instructions   Continuous Infusions:  cefTRIAXone (ROCEPHIN)  IV 2 g (11/14/18 1742)     LOS: 1 day    Time spent: 36 MINUTES.     Hosie Poisson, MD [facility] Pager [phone]   If 7PM-7AM, please contact night-coverage [email or web address] Password [facility] 11/14/2018, 5:53 PM

## 2018-11-14 NOTE — H&P (View-Only) (Signed)
ORTHOPAEDIC CONSULTATION  REQUESTING PHYSICIAN: Hosie Poisson, MD  Chief Complaint: Ulceration and right foot pain.  HPI: Joseph [name] is a 73 y.o. male who presents with gangrene of the right foot.  Patient has type 2 diabetes poorly controlled, end-stage renal disease, protein caloric malnutrition, and hypertension who first presented to the [facility] emergency room on 09/12/2018, 2 months ago.  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  Patient had an ischemic ulcer over the fifth metatarsal head at initial presentation as well as an abrasion dorsally over the foot.  Patient was then seen a total of 5 times in the [facility] emergency room for critical limb ischemia with progressive gangrenous changes to the right foot and was discharged to home with anticipation he would follow-up in the wound center.  Patient was also seen at health and wellness, for a total of 6 medical visits with progressive gangrenous changes of the right foot.    Patient denies a history of tobacco use.  Past Medical History:  Diagnosis Date  . Diabetes mellitus without complication ([place])   . Hypertension    Past Surgical History:  Procedure Laterality Date  . HAND SURGERY     Social History   Socioeconomic History  . Marital status: Single    Spouse name: Not on file  . Number of children: Not on file  . Years of education: Not on file  . Highest education level: Not on file  Occupational History  . Not on file  Social Needs  . Financial resource strain: Not on file  . Food insecurity    Worry: Not on file    Inability: Not on file  . Transportation needs    Medical: Not on file    Non-medical: Not on file  Tobacco Use  . Smoking status: Never Smoker  . Smokeless tobacco: Never Used  Substance and Sexual Activity  . Alcohol use: Yes  . Drug use: No  . Sexual activity: Not on file  Lifestyle  . Physical activity    Days per week: Not on file    Minutes per session: Not on  file  . Stress: Not on file  Relationships  . Social Herbalist on phone: Not on file    Gets together: Not on file    Attends religious service: Not on file    Active member of club or organization: Not on file    Attends meetings of clubs or organizations: Not on file    Relationship status: Not on file  Other Topics Concern  . Not on file  Social History Narrative  . Not on file   Family History  Problem Relation Age of Onset  . Diabetes Father    - negative except otherwise stated in the family history section No Known Allergies Prior to Admission medications   Medication Sig Start Date End Date Taking? Authorizing Provider  Aspirin-Salicylamide-Caffeine (BC HEADACHE POWDER PO) Take 4 packets by mouth 4 (four) times daily as needed (for pain).    Yes [provider]  glipiZIDE (GLUCOTROL) 5 MG tablet Take 1 tablet (5 mg total) by mouth daily before breakfast. 10/17/18  Yes McClung, Angela M, PA-C  HYDROcodone-acetaminophen (NORCO/VICODIN) 5-325 MG tablet Take 1 tablet by mouth every 6 (six) hours as needed for severe pain. 10/08/18  Yes Ripley Fraise, MD  lisinopril-hydrochlorothiazide (ZESTORETIC) 20-12.5 MG tablet Take 1 tablet by mouth daily. 10/17/18  Yes Argentina Donovan, PA-C  metFORMIN (GLUCOPHAGE) 1000 MG tablet Take 1 tablet (1,000 mg total) by mouth 2 (two) times daily. 10/17/18  Yes McClung, Angela M, PA-C  Multiple Vitamins-Minerals (ONE-A-DAY MENS 50+ ADVANTAGE) TABS Take 1 tablet by mouth daily with breakfast.   Yes [provider]  naproxen sodium (ALEVE) 220 MG tablet Take 440 mg by mouth 2 (two) times daily as needed (for pain).   Yes [provider]  mupirocin ointment (BACTROBAN) 2 % Apply bid to wound for 10 dyas Patient not taking: Reported on 11/13/2018 10/17/18   Anders SimmondsMcClung, Angela M, PA-C   Dg Chest 1 View  Result Date: 11/13/2018 CLINICAL DATA:  Tachycardia. EXAM: CHEST  1 VIEW COMPARISON:  None. FINDINGS: The heart size  and mediastinal contours are within normal limits. Both lungs are clear. The visualized skeletal structures are unremarkable. IMPRESSION: No active disease. Electronically Signed   By: Obie DredgeWilliam T Derry M.D.   On: 11/13/2018 19:44   Dg Foot Complete Right  Result Date: 11/13/2018 CLINICAL DATA:  Infection. Foot pain. EXAM: RIGHT FOOT COMPLETE - 3+ VIEW COMPARISON:  Radiograph 09/20/2018 FINDINGS: Soft tissue ulcer about the lateral aspect of the fifth metatarsal and metatarsal phalangeal joint with soft tissue air. No associated periosteal reaction or bony destruction. There is sclerosis about the adjacent sesamoid. Hammertoe deformity of the digits limits assessment. No acute fracture. There are vascular calcifications. IMPRESSION: Soft tissue ulcer about the fifth metatarsophalangeal joint with soft tissue air. No radiographic findings of osteomyelitis. Electronically Signed   By: Narda RutherfordMelanie  Sanford M.D.   On: 11/13/2018 19:48   - pertinent xrays, CT, MRI studies were reviewed and independently interpreted  Positive ROS: All other systems have been reviewed and were otherwise negative with the exception of those mentioned in the HPI and as above.  Physical Exam: General: Alert, no acute distress Psychiatric: Patient is competent for consent with normal mood and affect Lymphatic: No axillary or cervical lymphadenopathy Cardiovascular: No pedal edema Respiratory: No cyanosis, no use of accessory musculature GI: No organomegaly, abdomen is soft and non-tender        Images:  @ENCIMAGES @  Labs:  Lab Results  Component Value Date   HGBA1C 5.7 10/17/2018   HGBA1C 9.1 09/28/2013   HGBA1C 9.3 02/12/2013    Lab Results  Component Value Date   ALBUMIN 2.6 (L) 11/13/2018   ALBUMIN 3.4 (L) 10/08/2018   ALBUMIN 4.5 02/12/2013    Neurologic: Patient does not have protective sensation bilateral lower extremities.   MUSCULOSKELETAL:   Skin: Patient's right foot is cold and ischemic  patient has a gangrenous ulcer over the lateral column that extends down to bone.  Patient has a large ischemic ulcer plantar and medially that extends to the hindfoot.  Patient's left foot is also cool to the touch and ischemic but no ulcers.  Patient does not have a palpable dorsalis pedis pulse bilaterally he does not have protective sensation bilaterally, patient does have a strong palpable popliteal pulse on the right with good hair growth to mid tibia.  Assessment: Assessment: Diabetic insensate neuropathy with end-stage renal disease peripheral vascular disease with gangrene of the right foot and ischemia of the left foot.  Plan: Plan: Patient is scheduled for ankle-brachial indices.  I recommend vascular vein consult to see if vascular intervention is necessary for limb salvage of the left lower extremity.     Patient currently does not have limb salvage options of the right foot the plantar aspect and lateral aspect are necrotic and ischemic and are  not salvageable.  I have recommended with the patient proceeding with a transtibial amputation on Friday.  Patient has good circulation to the popliteal vessels and I feel he should heal a transtibial amputation well.  Patient will need discharge to skilled nursing, he does live home alone.  Patient may also need renal consultation for his end-stage renal disease.  Thank you for the consult and the opportunity to see Mr. Malissa HippoPoe  Marcus Duda, MD [facility] [phone] 6:38 AM

## 2018-11-14 NOTE — Consult Note (Signed)
[facility] Consult  Referring Provider: Kathlen ModyVijaya Akula, MD   Primary Care Physician:  Patient, No Pcp Per Primary Gastroenterologist: Unassigned(was dismissed from [facility] in 2009)  Reason for Consultation: Anemia, acute drop in hemoglobin  HPI: [place] Christy Gentlesoe is a 73 y.o. male was admitted on 11/13/2018 with right foot pain and ulceration with changes of gangrene. This is an African-American gentleman with history of diabetes type 2, poorly controlled, chronic kidney disease, protein calorie malnutrition and hypertension with right foot ischemic ulcer since 09/12/2018, [facility] for limb ischemia with progressive gangrenous changes and treated so far on antibiotics.  The plan is to perform a transtibial amputation on Friday. Patient was noted to have a drop in hemoglobin from 9.7-6.7 in 24 hours without obvious melena or hematochezia.  Patient reports having brown stools, denies change in bowel habits. No prior endoscopy or colonoscopy. Patient states he has been feeling very weak and denies bleeding from any orifices, no epistaxis, bleeding from gums, bruising or blood in urine. Patient has been taking a lot of BC powders and NSAIDs like Aleve, ibuprofen and Naprosyn. He denies heartburn, difficulty swallowing or pain on swallowing, denies early satiety or loss of appetite. There is no family history of colon cancer.   Past Medical History:  Diagnosis Date  . Diabetes mellitus without complication (HCC)   . Hypertension     Past Surgical History:  Procedure Laterality Date  . HAND SURGERY      Prior to Admission medications   Medication Sig Start Date End Date Taking? Authorizing Provider  Aspirin-Salicylamide-Caffeine (BC HEADACHE POWDER PO) Take 4 packets by mouth 4 (four) times daily as needed (for pain).    Yes [provider]  glipiZIDE (GLUCOTROL) 5 MG tablet Take 1 tablet (5 mg total) by mouth daily before breakfast. 10/17/18  Yes  McClung, Angela M, PA-C  HYDROcodone-acetaminophen (NORCO/VICODIN) 5-325 MG tablet Take 1 tablet by mouth every 6 (six) hours as needed for severe pain. 10/08/18  Yes Zadie RhineWickline, Donald, MD  lisinopril-hydrochlorothiazide (ZESTORETIC) 20-12.5 MG tablet Take 1 tablet by mouth daily. 10/17/18  Yes Georgian CoMcClung, Angela M, PA-C  metFORMIN (GLUCOPHAGE) 1000 MG tablet Take 1 tablet (1,000 mg total) by mouth 2 (two) times daily. 10/17/18  Yes McClung, Angela M, PA-C  Multiple Vitamins-Minerals (ONE-A-DAY MENS 50+ ADVANTAGE) TABS Take 1 tablet by mouth daily with breakfast.   Yes [provider]  naproxen sodium (ALEVE) 220 MG tablet Take 440 mg by mouth 2 (two) times daily as needed (for pain).   Yes [provider]  mupirocin ointment (BACTROBAN) 2 % Apply bid to wound for 10 dyas Patient not taking: Reported on 11/13/2018 10/17/18   Anders SimmondsMcClung, Angela M, PA-C    Current Facility-Administered Medications  Medication Dose Route Frequency Provider Last Rate Last Dose  . 0.9 %  sodium chloride infusion (Manually program via Guardrails IV Fluids)   Intravenous Once Charlsie QuestPatel, Vishal R, MD      . acetaminophen (TYLENOL) tablet 650 mg  650 mg Oral Q6H PRN Charlsie QuestPatel, Vishal R, MD       Or  . acetaminophen (TYLENOL) suppository 650 mg  650 mg Rectal Q6H PRN Darreld McleanPatel, Vishal R, MD      . cefTRIAXone (ROCEPHIN) 2 g in sodium chloride 0.9 % 100 mL IVPB  2 g Intravenous Q24H Patel, Vishal R, MD      . HYDROmorphone (DILAUDID) injection 0.5 mg  0.5 mg Intravenous Q4H PRN Charlsie QuestPatel, Vishal R, MD      .  insulin aspart (novoLOG) injection 0-15 Units  0-15 Units Subcutaneous TID WC Charlsie QuestPatel, Vishal R, MD   3 Units at 11/14/18 [phone]  . insulin aspart (novoLOG) injection 0-5 Units  0-5 Units Subcutaneous QHS Allena KatzPatel, Vishal R, MD      . ondansetron (ZOFRAN) tablet 4 mg  4 mg Oral Q6H PRN Charlsie QuestPatel, Vishal R, MD       Or  . ondansetron (ZOFRAN) injection 4 mg  4 mg Intravenous Q6H PRN Darreld McleanPatel, Vishal R, MD      . oxyCODONE (Oxy IR/ROXICODONE)  immediate release tablet 5 mg  5 mg Oral Q6H PRN Darreld McleanPatel, Vishal R, MD      . pantoprazole (PROTONIX) injection 40 mg  40 mg Intravenous Q12H Charlsie QuestPatel, Vishal R, MD   40 mg at 11/14/18 0937  . sodium chloride flush (NS) 0.9 % injection 3 mL  3 mL Intravenous Q12H Charlsie QuestPatel, Vishal R, MD   3 mL at 11/14/18 0944  . vancomycin variable dose per unstable renal function (pharmacist dosing)   Does not apply See admin instructions Carney, Gwenlyn FoundJessica C, RPH        Allergies as of 11/13/2018  . (No Known Allergies)    Family History  Problem Relation Age of Onset  . Diabetes Father     Social History   Socioeconomic History  . Marital status: Single    Spouse name: Not on file  . Number of children: Not on file  . Years of education: Not on file  . Highest education level: Not on file  Occupational History  . Not on file  Social Needs  . Financial resource strain: Not on file  . Food insecurity    Worry: Not on file    Inability: Not on file  . Transportation needs    Medical: Not on file    Non-medical: Not on file  Tobacco Use  . Smoking status: Never Smoker  . Smokeless tobacco: Never Used  Substance and Sexual Activity  . Alcohol use: Yes  . Drug use: No  . Sexual activity: Not on file  Lifestyle  . Physical activity    Days per week: Not on file    Minutes per session: Not on file  . Stress: Not on file  Relationships  . Social Musicianconnections    Talks on phone: Not on file    Gets together: Not on file    Attends religious service: Not on file    Active member of club or organization: Not on file    Attends meetings of clubs or organizations: Not on file    Relationship status: Not on file  . Intimate partner violence    Fear of current or ex partner: Not on file    Emotionally abused: Not on file    Physically abused: Not on file    Forced sexual activity: Not on file  Other Topics Concern  . Not on file  Social History Narrative  . Not on file    Review of Systems:  Positive for: GI: Described in detail in HPI.    Gen: fatigue, weakness, malaise, Denies any fever, chills, rigors, night sweats, anorexia, involuntary weight loss, and sleep disorder CV: Denies chest pain, angina, palpitations, syncope, orthopnea, PND, peripheral edema, and claudication. Resp: Denies dyspnea, cough, sputum, wheezing, coughing up blood. GU : Denies urinary burning, blood in urine, urinary frequency, urinary hesitancy, nocturnal urination, and urinary incontinence. MS: right foot pain and ulceration. Denies muscle weakness, cramps, atrophy.  Derm: non-healing  ulcer of right foot.Denies rash, itching, oral ulcerations, hives,   Psych: Denies depression, anxiety, memory loss, suicidal ideation, hallucinations,  and confusion. Heme: Denies bruising, bleeding, and enlarged lymph nodes. Neuro:  Denies any headaches, dizziness, paresthesias. Endo:  DM, Denies any problems with thyroid, adrenal function.  Physical Exam: Vital signs in last 24 hours: Temp:  [97.4 F (36.3 C)-99.5 F (37.5 C)] 98.2 F (36.8 C) (06/17 0647) Pulse Rate:  [68-117] 87 (06/17 0647) Resp:  [9-28] 16 (06/17 0427) BP: (84-127)/(57-77) 127/77 (06/17 0647) SpO2:  [96 %-100 %] 99 % (06/17 0647) Weight:  [108.9 kg] 108.9 kg (06/16 2357) Last BM Date: 11/13/18  General:   Alert,  Well-developed, well-nourished, pleasant and cooperative in NAD Head:  Normocephalic and atraumatic. Eyes:  Sclera clear, no icterus.  Pale Ears:  Normal auditory acuity. Nose:  No deformity, discharge,  or lesions. Mouth:  No deformity or lesions.  Oropharynx pink & moist. Neck:  Supple; no masses or thyromegaly. Lungs:  Clear throughout to auscultation.   No wheezes, crackles, or rhonchi. No acute distress. Heart:  Regular rate and rhythm; no murmurs, clicks, rubs,  or gallops. Extremities:  Right foot- gangrenous ulcer over lateral side and extending to plantar area and hindfoot, exposed bone Neurologic:  Alert and  oriented  x4;  grossly normal neurologically. Skin:  Intact without significant lesions or rashes. Psych:  Alert and cooperative. Normal mood and affect. Abdomen:  Soft, nontender and nondistended. No masses, hepatosplenomegaly or hernias noted. Normal bowel sounds, without guarding, and without rebound.         Lab Results: Recent Labs    11/13/18 1832 11/14/18 0006  WBC 44.9* 35.3*  HGB 9.7* 6.7*  HCT 29.2* 19.7*  PLT 356 251   BMET Recent Labs    11/13/18 1832 11/14/18 0006  NA 130* 135  K 4.1 4.0  CL 97* 107  CO2 14* 17*  GLUCOSE 271* 225*  BUN 76* 68*  CREATININE 3.15* 2.35*  CALCIUM 9.0 8.0*   LFT Recent Labs    11/13/18 1832  PROT 7.7  ALBUMIN 2.6*  AST 32  ALT 22  ALKPHOS 78  BILITOT 0.6   PT/INR No results for input(s): LABPROT, INR in the last 72 hours.  Studies/Results: Dg Chest 1 View  Result Date: 11/13/2018 CLINICAL DATA:  Tachycardia. EXAM: CHEST  1 VIEW COMPARISON:  None. FINDINGS: The heart size and mediastinal contours are within normal limits. Both lungs are clear. The visualized skeletal structures are unremarkable. IMPRESSION: No active disease. Electronically Signed   By: Obie DredgeWilliam T Derry M.D.   On: 11/13/2018 19:44   Dg Foot Complete Right  Result Date: 11/13/2018 CLINICAL DATA:  Infection. Foot pain. EXAM: RIGHT FOOT COMPLETE - 3+ VIEW COMPARISON:  Radiograph 09/20/2018 FINDINGS: Soft tissue ulcer about the lateral aspect of the fifth metatarsal and metatarsal phalangeal joint with soft tissue air. No associated periosteal reaction or bony destruction. There is sclerosis about the adjacent sesamoid. Hammertoe deformity of the digits limits assessment. No acute fracture. There are vascular calcifications. IMPRESSION: Soft tissue ulcer about the fifth metatarsophalangeal joint with soft tissue air. No radiographic findings of osteomyelitis. Electronically Signed   By: Narda RutherfordMelanie  Sanford M.D.   On: 11/13/2018 19:48    Impression: Right foot gangrene,  leukocytosis, improving lactic acidosis  Drop in Hb by 3 units without obvious melena or hematochezia  Renal impairment, elevated BUN/creatinine ratio   COVID negative  Plan: Drop in hemoglobin not explained without obvious melena or hematochezia. However,  patient has been using NSAIDs and there is possibility of having peptic ulcers(stomach or duodenal). Patient has never had a colonoscopy. Occult malignancy cannot be ruled out.   Ideally, he would benefit from both an endoscopy as well as a colonoscopy.  However, patient is not interested in inpatient GI work-up. Also, I do not believe he will be able to tolerate colonic prep due to right gangrenous foot or even later in this hospital admission if he gets an amputation.  Discussed the same with patient's hospitalist Dr. Karleen Hampshire. At this point recommend PPI, avoid NSAIDs, monitor H&H and transfuse as needed.  Patient will benefit from an EGD and a colonoscopy, likely as an outpatient, currently scheduled for possible transtibial amputation on Friday.    LOS: 1 day   Ronnette Juniper, MD  11/14/2018, 12:25 PM  Pager [phone] If no answer or after 5 PM call [phone]

## 2018-11-14 NOTE — Progress Notes (Addendum)
ABIs completed . Preliminary results pending [place] Millan Legan,RVS 11/14/2018 7;23 PM

## 2018-11-14 NOTE — Progress Notes (Signed)
Pharmacy Antibiotic Note  [name] Joseph [name] is a 73 y.o. male admitted on 11/13/2018 with sepsis and R foot gangrene (diabetic foot infection) and osteomyelitis, per 6/17 MRI report. Pharmacy has been consulted for vancomycin dosing. Pt is afebrile, last WBC 44. Patient with AKI; Scr with AM labs today was 2.35 (down from 3.15 yesterday; baseline ~1.3-1.5); renal function improving.  Other medical history includes: DM, HTN, diabetic neuropathy, PVD, critical lower limb ischemia, anemia  Random vancomycin level drawn at 11:41 AM today (~17 hrs after vancomycin 2 gm IV dose given last evening) was 13 mg/L.  Plan: Vancomycin 2 gm IV X 1 now Obtain SCr this evening to evaluate renal function improvement and determine subsequent vancomycin dosing plan. Monitor renal function and culture data  Height: 6\' 4"  (193 cm) Weight: 240 lb 1.3 oz (108.9 kg) IBW/kg (Calculated) : 86.8  Temp (24hrs), Avg:98.6 F (37 C), Min:98.1 F (36.7 C), Max:99.5 F (37.5 C)  Recent Labs  Lab 11/13/18 1832 11/14/18 0006 11/14/18 0236 11/14/18 1141  WBC 44.9* 35.3*  --  36.6*  CREATININE 3.15* 2.35*  --   --   LATICACIDVEN 4.0* 6.6* 0.4*  --   VANCORANDOM  --   --   --  13    Estimated Creatinine Clearance: 39 mL/min (A) (by C-G formula based on SCr of 2.35 mg/dL (H)).    No Known Allergies  Antimicrobials this admission: Ceftriaxone 2 gm IV Q 24 hrs 6/16>> Vanc 6/16 >>  Microbiology: 6/16 COVID: negative 6/16 bld cx X 1: NGTD  Thank you for allowing pharmacy to be a part of this patient's care.  Gillermina Hu, PharmD, BCPS, [facility] Clinical Pharmacist 11/14/2018 7:04 PM

## 2018-11-14 NOTE — Progress Notes (Signed)
Notified of patient's drop in hemoglobin to 6.7 compared to 9.7 previously.  Patient denies any obvious bleeding including epistaxis, hemoptysis, hematemesis, hematuria, hematochezia/melena.  He has not received any blood thinners so far while in the hospital.  There is likely some mild dilutional component but would be concerned about internal GI bleeding due to his reported excessive use of NSAIDs recently.  He currently denies any abdominal pain. -Keep in progressive care unit -Transfuse 1 unit PRBC, obtain posttransfusion CBC and transfuse further as needed -Monitor for signs/symptoms of bleeding -Continue SCDs for VTE prophylaxis -I will start Protonix IV 40 mg twice daily for GI protection in case of NSAID mediated upper GI bleed -Will also give additional 1 L normal saline bolus followed by maintenance fluids due to continued hypotension and lactic acidosis  Zada Finders, MD [facility]

## 2018-11-14 NOTE — Consult Note (Signed)
ORTHOPAEDIC CONSULTATION  REQUESTING PHYSICIAN: Hosie Poisson, MD  Chief Complaint: Ulceration and right foot pain.  HPI: Joseph [name] is a 73 y.o. male who presents with gangrene of the right foot.  Patient has type 2 diabetes poorly controlled, end-stage renal disease, protein caloric malnutrition, and hypertension who first presented to the [facility] emergency room on 09/12/2018, 2 months ago.  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  Patient had an ischemic ulcer over the fifth metatarsal head at initial presentation as well as an abrasion dorsally over the foot.  Patient was then seen a total of 5 times in the [facility] emergency room for critical limb ischemia with progressive gangrenous changes to the right foot and was discharged to home with anticipation he would follow-up in the wound center.  [facility], for a total of 6 medical visits with progressive gangrenous changes of the right foot.    Patient denies a history of tobacco use.  Past Medical History:  Diagnosis Date  . Diabetes mellitus without complication ([place])   . Hypertension    Past Surgical History:  Procedure Laterality Date  . HAND SURGERY     Social History   Socioeconomic History  . Marital status: Single    Spouse name: Not on file  . Number of children: Not on file  . Years of education: Not on file  . Highest education level: Not on file  Occupational History  . Not on file  Social Needs  . Financial resource strain: Not on file  . Food insecurity    Worry: Not on file    Inability: Not on file  . Transportation needs    Medical: Not on file    Non-medical: Not on file  Tobacco Use  . Smoking status: Never Smoker  . Smokeless tobacco: Never Used  Substance and Sexual Activity  . Alcohol use: Yes  . Drug use: No  . Sexual activity: Not on file  Lifestyle  . Physical activity    Days per week: Not on file    Minutes per session: Not on  file  . Stress: Not on file  Relationships  . Social Herbalist on phone: Not on file    Gets together: Not on file    Attends religious service: Not on file    Active member of club or organization: Not on file    Attends meetings of clubs or organizations: Not on file    Relationship status: Not on file  Other Topics Concern  . Not on file  Social History Narrative  . Not on file   Family History  Problem Relation Age of Onset  . Diabetes Father    - negative except otherwise stated in the family history section No Known Allergies Prior to Admission medications   Medication Sig Start Date End Date Taking? Authorizing Provider  Aspirin-Salicylamide-Caffeine (BC HEADACHE POWDER PO) Take 4 packets by mouth 4 (four) times daily as needed (for pain).    Yes [provider]  glipiZIDE (GLUCOTROL) 5 MG tablet Take 1 tablet (5 mg total) by mouth daily before breakfast. 10/17/18  Yes McClung, Angela M, PA-C  HYDROcodone-acetaminophen (NORCO/VICODIN) 5-325 MG tablet Take 1 tablet by mouth every 6 (six) hours as needed for severe pain. 10/08/18  Yes Ripley Fraise, MD  lisinopril-hydrochlorothiazide (ZESTORETIC) 20-12.5 MG tablet Take 1 tablet by mouth daily. 10/17/18  Yes Argentina Donovan, PA-C  metFORMIN (GLUCOPHAGE) 1000 MG tablet Take 1 tablet (1,000 mg total) by mouth 2 (two) times daily. 10/17/18  Yes McClung, Angela M, PA-C  Multiple Vitamins-Minerals (ONE-A-DAY MENS 50+ ADVANTAGE) TABS Take 1 tablet by mouth daily with breakfast.   Yes [provider]  naproxen sodium (ALEVE) 220 MG tablet Take 440 mg by mouth 2 (two) times daily as needed (for pain).   Yes [provider]  mupirocin ointment (BACTROBAN) 2 % Apply bid to wound for 10 dyas Patient not taking: Reported on 11/13/2018 10/17/18   McClung, Angela M, PA-C   Dg Chest 1 View  Result Date: 11/13/2018 CLINICAL DATA:  Tachycardia. EXAM: CHEST  1 VIEW COMPARISON:  None. FINDINGS: The heart size  and mediastinal contours are within normal limits. Both lungs are clear. The visualized skeletal structures are unremarkable. IMPRESSION: No active disease. Electronically Signed   By: William T Derry M.D.   On: 11/13/2018 19:44   Dg Foot Complete Right  Result Date: 11/13/2018 CLINICAL DATA:  Infection. Foot pain. EXAM: RIGHT FOOT COMPLETE - 3+ VIEW COMPARISON:  Radiograph 09/20/2018 FINDINGS: Soft tissue ulcer about the lateral aspect of the fifth metatarsal and metatarsal phalangeal joint with soft tissue air. No associated periosteal reaction or bony destruction. There is sclerosis about the adjacent sesamoid. Hammertoe deformity of the digits limits assessment. No acute fracture. There are vascular calcifications. IMPRESSION: Soft tissue ulcer about the fifth metatarsophalangeal joint with soft tissue air. No radiographic findings of osteomyelitis. Electronically Signed   By: Melanie  Sanford M.D.   On: 11/13/2018 19:48   - pertinent xrays, CT, MRI studies were reviewed and independently interpreted  Positive ROS: All other systems have been reviewed and were otherwise negative with the exception of those mentioned in the HPI and as above.  Physical Exam: General: Alert, no acute distress Psychiatric: Patient is competent for consent with normal mood and affect Lymphatic: No axillary or cervical lymphadenopathy Cardiovascular: No pedal edema Respiratory: No cyanosis, no use of accessory musculature GI: No organomegaly, abdomen is soft and non-tender        Images:  @ENCIMAGES@  Labs:  Lab Results  Component Value Date   HGBA1C 5.7 10/17/2018   HGBA1C 9.1 09/28/2013   HGBA1C 9.3 02/12/2013    Lab Results  Component Value Date   ALBUMIN 2.6 (L) 11/13/2018   ALBUMIN 3.4 (L) 10/08/2018   ALBUMIN 4.5 02/12/2013    Neurologic: Patient does not have protective sensation bilateral lower extremities.   MUSCULOSKELETAL:   Skin: Patient's right foot is cold and ischemic  patient has a gangrenous ulcer over the lateral column that extends down to bone.  Patient has a large ischemic ulcer plantar and medially that extends to the hindfoot.  Patient's left foot is also cool to the touch and ischemic but no ulcers.  Patient does not have a palpable dorsalis pedis pulse bilaterally he does not have protective sensation bilaterally, patient does have a strong palpable popliteal pulse on the right with good hair growth to mid tibia.  Assessment: Assessment: Diabetic insensate neuropathy with end-stage renal disease peripheral vascular disease with gangrene of the right foot and ischemia of the left foot.  Plan: Plan: Patient is scheduled for ankle-brachial indices.  I recommend vascular vein consult to see if vascular intervention is necessary for limb salvage of the left lower extremity.     Patient currently does not have limb salvage options of the right foot the plantar aspect and lateral aspect are necrotic and ischemic and are   not salvageable.  I have recommended with the patient proceeding with a transtibial amputation on Friday.  Patient has good circulation to the popliteal vessels and I feel he should heal a transtibial amputation well.  Patient will need discharge to skilled nursing, he does live home alone.  Patient may also need renal consultation for his end-stage renal disease.  Thank you for the consult and the opportunity to see Mr. Malissa HippoPoe   , MD [facility] [phone] 6:38 AM

## 2018-11-14 NOTE — Progress Notes (Signed)
Pharmacy Antibiotic Note  Joseph [name] is a 73 y.o. male admitted on 11/13/2018 with sepsis and R foot gangrene (diabetic foot infection) and osteomyelitis, per 6/17 MRI report. Pharmacy has been consulted for vancomycin dosing. Pt is afebrile, last WBC 44. Patient with AKI; Scr with AM labs today was 2.35 (down from 3.15 yesterday; baseline ~1.3-1.5); renal function improving.  Other medical history includes: DM, HTN, diabetic neuropathy, PVD, critical lower limb ischemia, anemia  Random vancomycin level drawn at 11:41 AM today (~17 hrs after vancomycin 2 gm IV dose given last evening) was 13 mg/L. Scr tonight came back at 1.6 (improving further to almost his baseline).   Patient lost IV access this evening - also Ortho concerned R foot likely not salvageable and might need amp.   Plan: Given lost of IV access and over 24 hours from last dose, will order 1500 mg IV tonight Order vancomycin 1250 mg IV every 24 hours starting 6/18 Monitor renal fx, clinical pic, cx results, and vanc levels as appropriate.   Height: 6\' 4"  (193 cm) Weight: 240 lb 1.3 oz (108.9 kg) IBW/kg (Calculated) : 86.8  Temp (24hrs), Avg:98.6 F (37 C), Min:98.1 F (36.7 C), Max:99.5 F (37.5 C)  Recent Labs  Lab 11/13/18 1832 11/14/18 0006 11/14/18 0236 11/14/18 1141 11/14/18 1923  WBC 44.9* 35.3*  --  36.6*  --   CREATININE 3.15* 2.35*  --   --  1.60*  LATICACIDVEN 4.0* 6.6* 0.4*  --   --   VANCORANDOM  --   --   --  13  --     Estimated Creatinine Clearance: 57.3 mL/min (A) (by C-G formula based on SCr of 1.6 mg/dL (H)).    No Known Allergies  Antimicrobials this admission: Ceftriaxone 2 gm IV Q 24 hrs 6/16>> Vanc 6/16 >>  Microbiology: 6/16 COVID: negative 6/16 bld cx X 1: NGTD  Thank you for allowing pharmacy to be a part of this patient's care.  Antonietta Jewel, PharmD, [place] Clinical Pharmacist  Pager: [phone] Phone: [phone] 11/14/2018 8:26 PM

## 2018-11-15 ENCOUNTER — Encounter (HOSPITAL_COMMUNITY): Payer: Self-pay | Admitting: Physician Assistant

## 2018-11-15 ENCOUNTER — Other Ambulatory Visit (HOSPITAL_COMMUNITY): Payer: Medicare Other

## 2018-11-15 DIAGNOSIS — I998 Other disorder of circulatory system: Secondary | ICD-10-CM

## 2018-11-15 DIAGNOSIS — I4891 Unspecified atrial fibrillation: Secondary | ICD-10-CM

## 2018-11-15 DIAGNOSIS — I480 Paroxysmal atrial fibrillation: Secondary | ICD-10-CM

## 2018-11-15 LAB — COMPREHENSIVE METABOLIC PANEL
ALT: 20 U/L (ref 0–44)
AST: 21 U/L (ref 15–41)
Albumin: 2.2 g/dL — ABNORMAL LOW (ref 3.5–5.0)
Alkaline Phosphatase: 67 U/L (ref 38–126)
Anion gap: 10 (ref 5–15)
BUN: 40 mg/dL — ABNORMAL HIGH (ref 8–23)
CO2: 20 mmol/L — ABNORMAL LOW (ref 22–32)
Calcium: 8.7 mg/dL — ABNORMAL LOW (ref 8.9–10.3)
Chloride: 112 mmol/L — ABNORMAL HIGH (ref 98–111)
Creatinine, Ser: 1.38 mg/dL — ABNORMAL HIGH (ref 0.61–1.24)
GFR calc Af Amer: 59 mL/min — ABNORMAL LOW (ref >60)
GFR calc non Af Amer: 51 mL/min — ABNORMAL LOW (ref >60)
Glucose, Bld: 206 mg/dL — ABNORMAL HIGH (ref 70–99)
Potassium: 4.1 mmol/L (ref 3.5–5.1)
Sodium: 142 mmol/L (ref 135–145)
Total Bilirubin: 0.5 mg/dL (ref 0.3–1.2)
Total Protein: 6.9 g/dL (ref 6.5–8.1)

## 2018-11-15 LAB — CBC
HCT: 28.2 % — ABNORMAL LOW (ref 39.0–52.0)
Hemoglobin: 9.3 g/dL — ABNORMAL LOW (ref 13.0–17.0)
MCH: 29.4 pg (ref 26.0–34.0)
MCHC: 33 g/dL (ref 30.0–36.0)
MCV: 89.2 fL (ref 80.0–100.0)
Platelets: 366 10*3/uL (ref 150–400)
RBC: 3.16 MIL/uL — ABNORMAL LOW (ref 4.22–5.81)
RDW: 13.6 % (ref 11.5–15.5)
WBC: 30.5 10*3/uL — ABNORMAL HIGH (ref 4.0–10.5)
nRBC: 0 % (ref 0.0–0.2)

## 2018-11-15 LAB — TYPE AND SCREEN
ABO/RH(D): A POS
Antibody Screen: NEGATIVE
Unit division: 0

## 2018-11-15 LAB — GLUCOSE, CAPILLARY
Glucose-Capillary: 203 mg/dL — ABNORMAL HIGH (ref 70–99)
Glucose-Capillary: 385 mg/dL — ABNORMAL HIGH (ref 70–99)
Glucose-Capillary: 118 mg/dL — ABNORMAL HIGH (ref 70–99)
Glucose-Capillary: 201 mg/dL — ABNORMAL HIGH (ref 70–99)

## 2018-11-15 LAB — SURGICAL PCR SCREEN
MRSA, PCR: NEGATIVE
Staphylococcus aureus: NEGATIVE

## 2018-11-15 LAB — BPAM RBC
Blood Product Expiration Date: 202007012359
ISSUE DATE / TIME: 202006170404
Unit Type and Rh: 6200

## 2018-11-15 LAB — MAGNESIUM: Magnesium: 2 mg/dL (ref 1.7–2.4)

## 2018-11-15 LAB — TSH: TSH: 0.616 u[IU]/mL (ref 0.350–4.500)

## 2018-11-15 LAB — OCCULT BLOOD X 1 CARD TO LAB, STOOL: Fecal Occult Bld: NEGATIVE

## 2018-11-15 LAB — TROPONIN I
Troponin I: 0.03 ng/mL (ref <0.03)
Troponin I: 0.03 ng/mL (ref <0.03)

## 2018-11-15 MED ORDER — ENSURE PRE-SURGERY PO LIQD
296.0000 mL | Freq: Once | ORAL | Status: AC
  Administered 2018-11-15: 296 mL via ORAL
  Filled 2018-11-15: qty 296

## 2018-11-15 MED ORDER — GLIPIZIDE 10 MG PO TABS
5.0000 mg | ORAL_TABLET | Freq: Every day | ORAL | Status: DC
  Administered 2018-11-17 – 2018-11-20 (×4): 5 mg via ORAL
  Filled 2018-11-15 (×4): qty 1

## 2018-11-15 MED ORDER — DILTIAZEM HCL-DEXTROSE 100-5 MG/100ML-% IV SOLN (PREMIX)
5.0000 mg/h | INTRAVENOUS | Status: DC
  Administered 2018-11-15 (×2): 5 mg/h via INTRAVENOUS
  Filled 2018-11-15: qty 100

## 2018-11-15 MED ORDER — AMIODARONE HCL IN DEXTROSE 360-4.14 MG/200ML-% IV SOLN
60.0000 mg/h | INTRAVENOUS | Status: AC
  Administered 2018-11-15: 60 mg/h via INTRAVENOUS

## 2018-11-15 MED ORDER — INSULIN ASPART 100 UNIT/ML ~~LOC~~ SOLN
3.0000 [IU] | Freq: Three times a day (TID) | SUBCUTANEOUS | Status: DC
  Administered 2018-11-15 – 2018-11-20 (×11): 3 [IU] via SUBCUTANEOUS

## 2018-11-15 MED ORDER — METOPROLOL TARTRATE 5 MG/5ML IV SOLN
5.0000 mg | Freq: Once | INTRAVENOUS | Status: AC
  Administered 2018-11-15: 5 mg via INTRAVENOUS
  Filled 2018-11-15: qty 5

## 2018-11-15 MED ORDER — INSULIN DETEMIR 100 UNIT/ML ~~LOC~~ SOLN
8.0000 [IU] | Freq: Every day | SUBCUTANEOUS | Status: DC
  Administered 2018-11-15 – 2018-11-19 (×5): 8 [IU] via SUBCUTANEOUS
  Filled 2018-11-15 (×6): qty 0.08

## 2018-11-15 MED ORDER — AMIODARONE HCL IN DEXTROSE 360-4.14 MG/200ML-% IV SOLN
30.0000 mg/h | INTRAVENOUS | Status: DC
  Administered 2018-11-16: 30 mg/h via INTRAVENOUS
  Filled 2018-11-15 (×2): qty 200

## 2018-11-15 MED ORDER — DILTIAZEM LOAD VIA INFUSION
10.0000 mg | Freq: Once | INTRAVENOUS | Status: AC
  Administered 2018-11-15: 10 mg via INTRAVENOUS
  Filled 2018-11-15: qty 10

## 2018-11-15 MED ORDER — CHLORHEXIDINE GLUCONATE 4 % EX LIQD
60.0000 mL | Freq: Once | CUTANEOUS | Status: AC
  Administered 2018-11-16: 4 via TOPICAL
  Filled 2018-11-15: qty 60

## 2018-11-15 MED ORDER — OXYCODONE HCL 5 MG PO TABS
5.0000 mg | ORAL_TABLET | ORAL | Status: DC | PRN
  Administered 2018-11-15 – 2018-11-16 (×3): 10 mg via ORAL
  Filled 2018-11-15 (×3): qty 2

## 2018-11-15 MED ORDER — CEFAZOLIN SODIUM-DEXTROSE 2-4 GM/100ML-% IV SOLN
2.0000 g | INTRAVENOUS | Status: AC
  Administered 2018-11-16: 09:00:00 2 g via INTRAVENOUS
  Filled 2018-11-15: qty 100

## 2018-11-15 MED ORDER — POVIDONE-IODINE 10 % EX SWAB: 2.0000 "application " | Freq: Once | CUTANEOUS | Status: DC

## 2018-11-15 NOTE — Progress Notes (Addendum)
ABI and TBI preliminary results are now in Chart review CV Proc. Virginnia Sulaiman Imbert,RVS 11/15/2018,9:53 AM

## 2018-11-15 NOTE — Consult Note (Addendum)
Cardiology Consultation:   Patient ID: Joseph [name]; [id number]; 25-May-1946   Admit date: 11/13/2018 Date of Consult: 11/15/2018  Primary Care Provider: Patient, No Pcp Per Primary Cardiologist: Dietrich Pates, MD (NEW) Primary Electrophysiologist:  None  Chief Complaint: foot ulcer  Patient Profile:   Joseph [name] is a 73 y.o. male with a hx of DM (A1c 5.7), HTN, CKD II, foot wound who is being seen today for the evaluation of atrial fib at the request of Dr. Blake Divine. He has no prior cardiac history.  History of Present Illness:   He noticed a wound on his right foot about 2 months ago that has not healed well. He was treated with multiple rounds of outpatient antibiotics initially with some improvement. However, the wound worsened with drainage, blistering, and swelling/pain of his RLE so he came to the ER. He was found to have profound leukocytosis with WBC 44k, anemia with Hgb 9.7, AKI on CKD stage II with Cr 3.15, and sepsis physiology with tachycardia and hypotension. He was seen by vascular with R ABIs with noncompressible arteries due to calcification and normal L ABIs although felt unreliable. They feel his right foot does not appear salvageable and recommend he undergo R BKA. He also had worsening anemia this admission in the setting of taking a lot of outpatient BC powders and NSAIDS like Aleve, ibuprofen and Naprosyn. He was seen by GI and the drop in Hgb was not explained without obvious melena or hematochezia. EGD and colonoscopy were felt ideal at some point but the patient was not interested in GI inpatient workup. Furthermore they did not think he would tolerate the colonic prep so recommended supportive care with PPI, avoidance of NSAIDS and transfusion PRN.  Admission EKG 11/13/18 showed atrial fib RVR, then f/u EKG showed sinus tach. Today he was noted to go back into atrial fib and also appears to have runs of SVT/atrial flutter as well with intermittent reversion back to NSR. He  received  IV Lopressor 3 hours ago. He got  IV diltiazem and drip but HR remains frequently in the 150s. He denies any hx of TIA/CVA and is unaware of his rapid AF. He denies any CP, SOB, orthopnea. He has localized edema in area of diseased right foot but otherwise no generalized edema. Last labs with Cr 1.38, albumin 2.2, Mg 2.0, K 4.1, WBC 30.5k, Hgb 9.3, TSH wnl, LDL 39.  Past Medical History:  Diagnosis Date   Diabetes mellitus without complication (HCC)    Hypertension     Past Surgical History:  Procedure Laterality Date   HAND SURGERY       Inpatient Medications: Scheduled Meds:  sodium chloride   Intravenous Once   diltiazem  10 mg Intravenous Once   insulin aspart  0-15 Units Subcutaneous TID WC   insulin aspart  0-5 Units Subcutaneous QHS   insulin aspart  3 Units Subcutaneous TID WC   insulin detemir  8 Units Subcutaneous QHS   pantoprazole (PROTONIX) IV  40 mg Intravenous Q12H   sodium chloride flush  3 mL Intravenous Q12H   Continuous Infusions:  amiodarone     [START ON 11/16/2018] amiodarone     cefTRIAXone (ROCEPHIN)  IV 2 g (11/15/18 1719)   vancomycin     PRN Meds: acetaminophen **OR** acetaminophen, HYDROmorphone (DILAUDID) injection, ondansetron **OR** ondansetron (ZOFRAN) IV, oxyCODONE  Home Meds: Prior to Admission medications   Medication Sig Start Date End Date Taking? Authorizing Provider  Aspirin-Salicylamide-Caffeine (BC HEADACHE POWDER PO) Take  4 packets by mouth 4 (four) times daily as needed (for pain).    Yes [provider]  glipiZIDE (GLUCOTROL) 5 MG tablet Take 1 tablet (5 mg total) by mouth daily before breakfast. 10/17/18  Yes McClung, Angela M, PA-C  HYDROcodone-acetaminophen (NORCO/VICODIN) 5-325 MG tablet Take 1 tablet by mouth every 6 (six) hours as needed for severe pain. 10/08/18  Yes Ripley Fraise, MD  lisinopril-hydrochlorothiazide (ZESTORETIC) 20-12.5 MG tablet Take 1 tablet by mouth daily. 10/17/18   Yes Freeman Caldron M, PA-C  metFORMIN (GLUCOPHAGE) 1000 MG tablet Take 1 tablet (1,000 mg total) by mouth 2 (two) times daily. 10/17/18  Yes McClung, Angela M, PA-C  Multiple Vitamins-Minerals (ONE-A-DAY MENS 50+ ADVANTAGE) TABS Take 1 tablet by mouth daily with breakfast.   Yes [provider]  naproxen sodium (ALEVE) 220 MG tablet Take 440 mg by mouth 2 (two) times daily as needed (for pain).   Yes [provider]  mupirocin ointment (BACTROBAN) 2 % Apply bid to wound for 10 dyas Patient not taking: Reported on 11/13/2018 10/17/18   Argentina Donovan, PA-C    Allergies:   No Known Allergies  Social History:   Social History   Socioeconomic History   Marital status: Single    Spouse name: Not on file   Number of children: Not on file   Years of education: Not on file   Highest education level: Not on file  Occupational History   Not on file  Social Needs   Financial resource strain: Not on file   Food insecurity    Worry: Not on file    Inability: Not on file   Transportation needs    Medical: Not on file    Non-medical: Not on file  Tobacco Use   Smoking status: Never Smoker   Smokeless tobacco: Never Used  Substance and Sexual Activity   Alcohol use: Yes   Drug use: No   Sexual activity: Not on file  Lifestyle   Physical activity    Days per week: Not on file    Minutes per session: Not on file   Stress: Not on file  Relationships   Social connections    Talks on phone: Not on file    Gets together: Not on file    Attends religious service: Not on file    Active member of club or organization: Not on file    Attends meetings of clubs or organizations: Not on file    Relationship status: Not on file   Intimate partner violence    Fear of current or ex partner: Not on file    Emotionally abused: Not on file    Physically abused: Not on file    Forced sexual activity: Not on file  Other Topics Concern   Not on file  Social  History Narrative   Not on file    Family History:   The patient's family history includes Diabetes in his father.  ROS:  Please see the history of present illness.  All other ROS reviewed and negative.     Physical Exam/Data:   Vitals:   11/14/18 1425 11/14/18 2004 11/15/18 0528 11/15/18 1437  BP: 106/85 128/72 137/75 107/67  Pulse: 86 85 82 (!) 112  Resp:    20  Temp: 98.6 F (37 C) 98.6 F (37 C) 98.9 F (37.2 C) 98.5 F (36.9 C)  TempSrc: Oral Oral Oral Oral  SpO2: 100% 100% 98% 100%  Weight:   93.4 kg  Height:        Intake/Output Summary (Last 24 hours) at 11/15/2018 1751 Last data filed at 11/15/2018 1200 Gross per 24 hour  Intake 1100 ml  Output 2100 ml  Net -1000 ml   Last 3 Weights 11/15/2018 11/13/2018 11/13/2018  Weight (lbs) 205 lb 14.4 oz 240 lb 1.3 oz 240 lb  Weight (kg) 93.396 kg 108.9 kg 108.863 kg    Body mass index is 25.06 kg/m.  General: Well developed, well nourished AAM in no acute distress. Lively, talkative Head: Normocephalic, atraumatic, sclera non-icteric, no xanthomas, nares are without discharge.  Neck: Negative for carotid bruits. JVD not elevated. Lungs: Clear bilaterally to auscultation without wheezes, rales, or rhonchi. Breathing is unlabored. Heart: Irregularly irregular, tachycardic, with S1 S2. No murmurs, rubs, or gallops appreciated. Abdomen: Soft, non-tender, non-distended with normoactive bowel sounds. No hepatomegaly. No rebound/guarding. No obvious abdominal masses. Msk:  Strength and tone appear normal for age. Extremities: No clubbing or cyanosis. 1+ RLE dusky erythematous edema with necrotic ulcer over right  Side foot below 5th toe - yellow/black (see pictures in [facility] note) Neuro: Alert and oriented X 3. No facial asymmetry. No focal deficit. Moves all extremities spontaneously. Psych:  Responds to questions appropriately with a normal affect.  EKG:  The EKG was personally reviewed and demonstrates atrial fib RVR, IRBBB,  nonspecific STT changes.  He also had a tracing showing sinus tach as well.   Laboratory Data:  Chemistry Recent Labs  Lab 11/13/18 1832 11/14/18 0006 11/14/18 1923 11/15/18 0458  NA 130* 135  --  142  K 4.1 4.0  --  4.1  CL 97* 107  --  112*  CO2 14* 17*  --  20*  GLUCOSE 271* 225*  --  206*  BUN 76* 68*  --  40*  CREATININE 3.15* 2.35* 1.60* 1.38*  CALCIUM 9.0 8.0*  --  8.7*  GFRNONAA 19* 27* 43* 51*  GFRAA 22* 31* 49* 59*  ANIONGAP 19* 11  --  10    Recent Labs  Lab 11/13/18 1832 11/15/18 0458  PROT 7.7 6.9  ALBUMIN 2.6* 2.2*  AST 32 21  ALT 22 20  ALKPHOS 78 67  BILITOT 0.6 0.5   Hematology Recent Labs  Lab 11/14/18 0006 11/14/18 1141 11/15/18 1117  WBC 35.3* 36.6* 30.5*  RBC 2.18* 3.16*   3.16* 3.16*  HGB 6.7* 9.5* 9.3*  HCT 19.7* 28.6* 28.2*  MCV 90.4 90.5 89.2  MCH 30.7 30.1 29.4  MCHC 34.0 33.2 33.0  RDW 13.1 13.6 13.6  PLT 251 321 366   Cardiac Enzymes Recent Labs  Lab 11/15/18 1411  TROPONINI 0.03*   No results for input(s): TROPIPOC in the last 168 hours.  BNPNo results for input(s): BNP, PROBNP in the last 168 hours.  DDimer No results for input(s): DDIMER in the last 168 hours.  Radiology/Studies:  Dg Chest 1 View  Result Date: 11/13/2018 CLINICAL DATA:  Tachycardia. EXAM: CHEST  1 VIEW COMPARISON:  None. FINDINGS: The heart size and mediastinal contours are within normal limits. Both lungs are clear. The visualized skeletal structures are unremarkable. IMPRESSION: No active disease. Electronically Signed   By: Obie DredgeWilliam T Derry M.D.   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 Subsequent encounter. EXAM: MRI OF THE RIGHT FOREFOOT WITHOUT CONTRAST  TECHNIQUE: Multiplanar, multisequence MR imaging of the right forefoot was performed. No  intravenous contrast was administered. COMPARISON:  Plain films right foot 09/12/2018, 09/20/2018 and 11/13/2018. FINDINGS: Bones/Joint/Cartilage There is edema in the distal 4 cm of the fifth metatarsal and throughout the fifth toe consistent with osteomyelitis. Gas is seen in the fifth metatarsal head. Bone marrow signal is otherwise unremarkable. Ligaments Intact. Muscles and Tendons Intact.  No intramuscular fluid collection. Soft tissues A large skin ulceration over the distal fifth metatarsal and proximal fifth toe is identified. Bone appears exposed. No abscess. IMPRESSION: Large skin ulceration along the lateral aspect of the distal fifth metatarsal proximal fifth toe with findings consistent with osteomyelitis in the distal 4 cm of the fifth metatarsal and throughout the fifth toe. 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 Negative for abscess or septic joint. Electronically Signed   By: Drusilla Kannerhomas  Dalessio M.D.   On: 11/14/2018 12:29   Dg Foot Complete Right  Result Date: 11/13/2018 CLINICAL DATA:  Infection. Foot pain. EXAM: RIGHT FOOT COMPLETE - 3+ VIEW COMPARISON:  Radiograph 09/20/2018 FINDINGS: Soft tissue ulcer about the lateral aspect of the fifth metatarsal and metatarsal phalangeal joint with soft tissue air. No associated periosteal reaction or bony destruction. There is sclerosis about the adjacent sesamoid. Hammertoe deformity of the digits limits assessment. No acute fracture. There are vascular calcifications. IMPRESSION: Soft tissue ulcer about the fifth metatarsophalangeal joint with soft tissue air. No radiographic findings of osteomyelitis. Electronically Signed   By: Narda RutherfordMelanie  Sanford M.D.   On: 11/13/2018 19:48   Vas [place] Vanice Sarahbi With/wo Tbi  Result Date: 11/15/2018 LOWER EXTREMITY DOPPLER STUDY Indications: Gangrene, and Dry gangrene right foot. Diminishsed pulses              bilaterally. High Risk Factors: Diabetes.  Comparison Study: No  comparison study available Performing Technologist: Milta DeitersSlaughter, [place] [facility]  Examination Guidelines: A complete evaluation includes at minimum, Doppler waveform signals and systolic blood pressure reading at the level of bilateral brachial, anterior tibial, and posterior tibial arteries, when vessel segments are accessible. Bilateral testing is considered an integral part of a complete examination. Photoelectric Plethysmograph (PPG) waveforms and toe systolic pressure readings are included as required and additional duplex testing as needed. Limited examinations for reoccurring indications may be performed as noted.  ABI Findings: +---------+------------------+-----+-------------------+-----------------------+  Right     Rt Pressure (mmHg) Index Waveform            Comment                  +---------+------------------+-----+-------------------+-----------------------+  Brachial  142                      triphasic                                    +---------+------------------+-----+-------------------+-----------------------+  PTA       116                0.82  dampened monophasic                          +---------+------------------+-----+-------------------+-----------------------+  DP        245                1.73  dampened monophasic                          +---------+------------------+-----+-------------------+-----------------------+  Great Toe                                              Unable to fully                                                                  evaluate due to the                                                              size of the toe and the                                                          cuff                     +---------+------------------+-----+-------------------+-----------------------+ +---------+------------------+-----+-------------------+-------+  Left      Lt Pressure (mmHg) Index Waveform            Comment   +---------+------------------+-----+-------------------+-------+  Brachial  140                      triphasic                    +---------+------------------+-----+-------------------+-------+  PTA       130                0.92  dampened monophasic          +---------+------------------+-----+-------------------+-------+  DP        151                1.06  dampened monophasic          +---------+------------------+-----+-------------------+-------+  Great Toe 29                 0.20                               +---------+------------------+-----+-------------------+-------+ +-------+-----------+----------------+------------+------------+  ABI/TBI Today's ABI Today's TBI      Previous ABI Previous TBI  +-------+-----------+----------------+------------+------------+  Right   1.73        Unable to obtain                            +-------+-----------+----------------+------------+------------+  Left    1.06        0.20                                        +-------+-----------+----------------+------------+------------+  Summary: Right - ABIs indicate non compressible arteries probably secondary to calcification. TBIs are unreliable -  see comments in the chart listed above. Left - ABIs apperar normal however. ABIs are unreliable. Doppler waveforms and signals do not support the ABI value possible due to a falsely elevation of pressures. TBI is abnormal  *See table(s) above for measurements and observations.  Electronically signed by Lemar Livings MD on 11/15/2018 at 4:09:15 PM.    Final     Assessment and Plan:   1. Sepsis related to right foot ulcer with unsalvageable foot, with plans for right BKA by ortho. Also being followed by vascular surgery.  2. AKI on CKD stage II - improved. Avoid nephrotoxic agents.  3. Newly document paroxysmal atrial fibrillation/atrial flutter/SVT with RVR -.  Pt has been in and out   Has had SR earlier today  BP has been  soft at times, limit rate controlling agents.  Metoprolol did not provide any lasting impact. Diltiazem also minimally impacted rate. Per discussion with Dr. Tenny Craw, since he is spontaneously converting intermittently on his own, will start amio drip without bolus. Ultimately his AF is unlikely to fully quiesce until his gangrenous foot is removed. TSH wnl. 2D echo ordered.  Pt's  CHADSVASC 4 for HTN, age, diabetes, vascular disease (PAD), but with severe anemia this admission and impending surgery, would not start anticoagulation. I also reviewed with Dr. Blake Divine who acknowledges the concern of anticoagulation especially since it is felt he may have a GI ulcer, although the patient has declined to proceed with EGD at this point.  4. Hx of HTN  BP has been low on this admit probably due to HR and gangrene  Meds on hold    5. DM - per IM.  6. Acute anemia - seen by GI as above. No overt bleeding reported by patient, Pt is  S/p transfusion with fairly stable Hgb since then. Potential GI eval in future  For questions or updates, please contact [facility] Please consult [email or web address] for contact info under Cardiology/STEMI.    Signed, Laurann Montana, PA-C  11/15/2018 5:51 PM   Pt seen and examined   I have amended note above by D Dunn to reflect my findings. Pt is a 73 yo admitted with gangrenous foot  Plan for amputation   Found to be in intermitt Afib/flutter vs SVT alternating with SR   New  On exam, he denies palptitations  Appears comfortable though complains of foot pain Neck:  JVP is not elevated Lungs are CTA    Cardiac exam   Irreg irreg  No S3   No signif murmurs Abd is supple Ext   R  Foot with gangrene on lateral aspect   Foot swollen   L foot with very mild swelling  As noted above would try amiodarone to see if he will maintain SR   Should do better once amputatoin done.     I do not think this should delay surgery     With anemia not a good candidate for anticoagulation   This is another argument for using amiodarone to help  maintain SR.  Pt without active angina.  No CHF on exam   Watch fluids closely in periop period   COntinue telemetry.  Will continue to follow  Dietrich Pates MD

## 2018-11-15 NOTE — Progress Notes (Signed)
Patient in Afib with RVR. EKG obtained and placed in chart. Pt lying comfortably in bed, denies at SOB, lightheaded or dizziness. Paged MD. Will continue to monitor.

## 2018-11-15 NOTE — Progress Notes (Signed)
PROGRESS NOTE    Joseph [name]  ZOX:[id number] DOB: [birth year] DOA: 11/13/2018 PCP: Patient, No Pcp Per    Brief Narrative: Joseph Cavanah is a 73 y.o. male with medical history significant for type 2 diabetes, hypertension, CKD stage II who presents to the ED for evaluation of right foot wound that has been ongoing for the last 2 months.  Patient first noticed a wound to his right foot about 2 months ago.  He noticed a small laceration to his right dorsal foot after doing yard work.  He was seen in the ED initially on 09/12/2018 and treated with a 5-day course of doxycycline for cellulitis and superficial ulcer.  He was seen again on 09/20/2018 in the ED and switched to oral clindamycin.  He was seen for follow-up wound check in the ED on 09/23/2018 at which time it was felt his wound was improving.  He was seen again on 10/08/2018 and the wound appeared to be healing with chronic necrotic appearance.  He was given a prescription for oral Augmentin.  He was seen by community health and wellness to establish care on 10/17/2018 during which time he felt his foot pain and wound was improving.  He was given oral Augmentin and referred to the wound care clinic.   Assessment & Plan:   Principal Problem:   Sepsis with acute renal failure (HCC) Active Problems:   DM (diabetes mellitus) (HCC)   Hypertension associated with diabetes (HCC)   Acute renal failure superimposed on stage 2 chronic kidney disease (HCC)   Open wound of right foot   Gangrene of right foot (HCC)   Diabetic polyneuropathy associated with type 2 diabetes mellitus (HCC)   PVD (peripheral vascular disease) (HCC)   Critical lower limb ischemia  Sepsis secondary to right foot infection/ right foot gangrene Continue with broad-spectrum antibiotics. Orthopedics consulted and recommendations given. Plan for BKA on the right tomorrow.  Appreciate vascular surgery consult. Outpatient follow up with vascular in 3 months.  Follow blood  cultures.  ABIs to assess peripheral vascular disease. Afebrile and leukocytosis is improving.     Acute on stage II CKD with metabolic acidosis Improving with fluid resuscitation. Hold nephrotoxins. Avoid NSAIDS  Peripheral vascular disease: Appreciate vascular input,  Will start hin on aspirin and statin on discharge.   Type 2 DM CBG (last 3)  Recent Labs    11/14/18 2144 11/15/18 0739 11/15/18 1138  GLUCAP 205* 203* 385*   Resume SSI.    Anemia of blood loss vs anemia of chronic disease:  S/p 1 UNIT  Of prbc transfusion.  Repeat H&H is around 9.  GI consulted to see if EGD / colonoscopy can be done. Patient is refusing at this time.  Anemia panel reviewed.   New onset atrial fibrillation with RVR.  Rate not well controlled.  Started the patient on cardizem gtt. Echocardiogram ordered.  tsh wnl. Serial troponin's.  Pt remains asymptomatic, no chest pain or sob.  Hypertension: Well controlled.   Lactic acidosis  Resolved.    DVT prophylaxis:scd's Code Status: full code.  Family Communication: none at bedside. Discussed with the pts ex wife as per the patient's request.  Disposition Plan: pending clinical improvement and further management .   Consultants:   Gastroenterology.   Orthopedics.   vascular surgery.   Procedures: none.  Antimicrobials: rocephin    Subjective: No chest pain or sob, no nausea, vomiting.   Objective: Vitals:   11/14/18 1425 11/14/18 2004 11/15/18 0528 11/15/18 1437  BP: 106/85 128/72 137/75 107/67  Pulse: 86 85 82 (!) 112  Resp:    20  Temp: 98.6 F (37 C) 98.6 F (37 C) 98.9 F (37.2 C) 98.5 F (36.9 C)  TempSrc: Oral Oral Oral Oral  SpO2: 100% 100% 98% 100%  Weight:   93.4 kg   Height:        Intake/Output Summary (Last 24 hours) at 11/15/2018 1554 Last data filed at 11/15/2018 1200 Gross per 24 hour  Intake 1100 ml  Output 2100 ml  Net -1000 ml   Filed Weights   11/13/18 1825 11/13/18 2357 11/15/18  0528  Weight: 108.9 kg 108.9 kg 93.4 kg    Examination:  General exam: Appears calm and comfortable  Respiratory system: Clear to auscultation. Respiratory effort normal. Cardiovascular system: S1 & S2 heard, irregular.  Gastrointestinal system: Abdomen is nondistended, soft and nontender. No organomegaly or masses felt. Normal bowel sounds heard. Central nervous system: Alert and oriented. No focal neurological deficits. Extremities: right lower extremity wound bandaged.  Skin: see above.  Psychiatry:  Mood & affect appropriate.     Data Reviewed: I have personally reviewed following labs and imaging studies  CBC: Recent Labs  Lab 11/13/18 1832 11/14/18 0006 11/14/18 1141 11/15/18 1117  WBC 44.9* 35.3* 36.6* 30.5*  NEUTROABS 39.5*  --   --   --   HGB 9.7* 6.7* 9.5* 9.3*  HCT 29.2* 19.7* 28.6* 28.2*  MCV 91.5 90.4 90.5 89.2  PLT 356 251 321 366   Basic Metabolic Panel: Recent Labs  Lab 11/13/18 1832 11/14/18 0006 11/14/18 1923 11/15/18 0458 11/15/18 1411  NA 130* 135  --  142  --   K 4.1 4.0  --  4.1  --   CL 97* 107  --  112*  --   CO2 14* 17*  --  20*  --   GLUCOSE 271* 225*  --  206*  --   BUN 76* 68*  --  40*  --   CREATININE 3.15* 2.35* 1.60* 1.38*  --   CALCIUM 9.0 8.0*  --  8.7*  --   MG  --   --   --   --  2.0   GFR: Estimated Creatinine Clearance: 60.3 mL/min (A) (by C-G formula based on SCr of 1.38 mg/dL (H)). Liver Function Tests: Recent Labs  Lab 11/13/18 1832 11/15/18 0458  AST 32 21  ALT 22 20  ALKPHOS 78 67  BILITOT 0.6 0.5  PROT 7.7 6.9  ALBUMIN 2.6* 2.2*   No results for input(s): LIPASE, AMYLASE in the last 168 hours. No results for input(s): AMMONIA in the last 168 hours. Coagulation Profile: No results for input(s): INR, PROTIME in the last 168 hours. Cardiac Enzymes: Recent Labs  Lab 11/15/18 1411  TROPONINI 0.03*   BNP (last 3 results) No results for input(s): PROBNP in the last 8760 hours. HbA1C: No results for  input(s): HGBA1C in the last 72 hours. CBG: Recent Labs  Lab 11/14/18 1153 11/14/18 1731 11/14/18 2144 11/15/18 0739 11/15/18 1138  GLUCAP 148* 190* 205* 203* 385*   Lipid Profile: Recent Labs    11/14/18 0006  CHOL 61  HDL 13*  LDLCALC 39  TRIG 44  CHOLHDL 4.7   Thyroid Function Tests: Recent Labs    11/15/18 1411  TSH 0.616   Anemia Panel: Recent Labs    11/14/18 1141  VITAMINB12 333  FOLATE 21.2  FERRITIN 554*  TIBC 183*  IRON 78  RETICCTPCT 1.2  Sepsis Labs: Recent Labs  Lab 11/13/18 1832 11/14/18 0006 11/14/18 0236  LATICACIDVEN 4.0* 6.6* 0.4*    Recent Results (from the past 240 hour(s))  Blood Culture (routine x 2)     Status: None (Preliminary result)   Collection Time: 11/13/18  6:33 PM   Specimen: BLOOD  Result Value Ref Range Status   Specimen Description BLOOD RIGHT ANTECUBITAL  Final   Special Requests   Final    BOTTLES DRAWN AEROBIC AND ANAEROBIC Blood Culture adequate volume   Culture   Final    NO GROWTH 2 DAYS Performed at [facility], [address] [place], [place] [address]    Report Status PENDING  Incomplete  SARS Coronavirus 2     Status: None   Collection Time: 11/13/18  6:37 PM  Result Value Ref Range Status   SARS Coronavirus 2 NOT DETECTED NOT DETECTED Final    Comment: (NOTE) SARS-CoV-2 target nucleic acids are NOT DETECTED. The SARS-CoV-2 RNA is generally detectable in upper and lower respiratory specimens during the acute phase of infection.  Negative  results do not preclude SARS-CoV-2 infection, do not rule out co-infections with other pathogens, and should not be used as the sole basis for treatment or other patient management decisions.  Negative results must be combined with clinical observations, patient history, and epidemiological information. The expected result is Not Detected. Fact Sheet for  Patients: http://www.biofiredefense.com/wp-content/uploads/2020/03/BIOFIRE-COVID -19-patients.pdf Fact Sheet for Healthcare Providers: http://www.biofiredefense.com/wp-content/uploads/2020/03/BIOFIRE-COVID -19-hcp.pdf This test is not yet approved or cleared by the [place] and  has been authorized for detection and/or diagnosis of SARS-CoV-2 by FDA under an Emergency Use Authorization (EUA).  This EUA will remain in effec t (meaning this test can be used) for the duration of  the COVID-19 declaration under Section 564(b)(1) of the Act, 21 U.S.C. section 360bbb-3(b)(1), unless the authorization is terminated or revoked sooner. Performed at [facility], [address], [place], [place] [address]   Blood Culture (routine x 2)     Status: None (Preliminary result)   Collection Time: 11/13/18  6:48 PM   Specimen: BLOOD LEFT ARM  Result Value Ref Range Status   Specimen Description BLOOD LEFT ARM  Final   Special Requests   Final    BOTTLES DRAWN AEROBIC AND ANAEROBIC Blood Culture adequate volume   Culture   Final    NO GROWTH 2 DAYS Performed at [facility], [address], [place], [place] [address]    Report Status PENDING  Incomplete         Radiology Studies: Dg Chest 1 View  Result Date: 11/13/2018 CLINICAL DATA:  Tachycardia. EXAM: CHEST  1 VIEW COMPARISON:  None. FINDINGS: The heart size and mediastinal contours are within normal limits. Both lungs are clear. The visualized skeletal structures are unremarkable. IMPRESSION: No active disease. Electronically Signed   By: Obie Dredge M.D.   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 Subsequent encounter. EXAM: MRI OF THE RIGHT FOREFOOT WITHOUT CONTRAST TECHNIQUE: Multiplanar, multisequence MR  imaging of the right forefoot was performed. No intravenous contrast was administered. COMPARISON:  Plain films right foot 09/12/2018, 09/20/2018 and 11/13/2018. FINDINGS: Bones/Joint/Cartilage There is edema in the distal 4 cm of the fifth metatarsal and throughout the fifth toe consistent with  osteomyelitis. Gas is seen in the fifth metatarsal head. Bone marrow signal is otherwise unremarkable. Ligaments Intact. Muscles and Tendons Intact.  No intramuscular fluid collection. Soft tissues A large skin ulceration over the distal fifth metatarsal and proximal fifth toe is identified. Bone appears exposed. No abscess. IMPRESSION: Large skin ulceration along the lateral aspect of the distal fifth metatarsal proximal fifth toe with findings consistent with osteomyelitis in the distal 4 cm of the fifth metatarsal and throughout the fifth toe. 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 Negative for abscess or septic joint. Electronically Signed   By: Drusilla Kannerhomas  Dalessio M.D.   On: 11/14/2018 12:29   Dg Foot Complete Right  Result Date: 11/13/2018 CLINICAL DATA:  Infection. Foot pain. EXAM: RIGHT FOOT COMPLETE - 3+ VIEW COMPARISON:  Radiograph 09/20/2018 FINDINGS: Soft tissue ulcer about the lateral aspect of the fifth metatarsal and metatarsal phalangeal joint with soft tissue air. No associated periosteal reaction or bony destruction. There is sclerosis about the adjacent sesamoid. Hammertoe deformity of the digits limits assessment. No acute fracture. There are vascular calcifications. IMPRESSION: Soft tissue ulcer about the fifth metatarsophalangeal joint with soft tissue air. No radiographic findings of osteomyelitis. Electronically Signed   By: Narda RutherfordMelanie  Sanford M.D.   On: 11/13/2018 19:48   Vas Koreas Vanice Sarahbi With/wo Tbi  Result Date: 11/15/2018 LOWER EXTREMITY DOPPLER STUDY Indications: Gangrene, and Dry gangrene right foot. Diminishsed pulses              bilaterally. High Risk  Factors: Diabetes.  Comparison Study: No comparison study available Performing Technologist: Milta DeitersSlaughter, [place] [facility]  Examination Guidelines: A complete evaluation includes at minimum, Doppler waveform signals and systolic blood pressure reading at the level of bilateral brachial, anterior tibial, and posterior tibial arteries, when vessel segments are accessible. Bilateral testing is considered an integral part of a complete examination. Photoelectric Plethysmograph (PPG) waveforms and toe systolic pressure readings are included as required and additional duplex testing as needed. Limited examinations for reoccurring indications may be performed as noted.  ABI Findings: +---------+------------------+-----+-------------------+-----------------------+  Right     Rt Pressure (mmHg) Index Waveform            Comment                  +---------+------------------+-----+-------------------+-----------------------+  Brachial  142                      triphasic                                    +---------+------------------+-----+-------------------+-----------------------+  PTA       116                0.82  dampened monophasic                          +---------+------------------+-----+-------------------+-----------------------+  DP        245                1.73  dampened monophasic                          +---------+------------------+-----+-------------------+-----------------------+  Great Toe  Unable to fully                                                                  evaluate due to the                                                              size of the toe and the                                                          cuff                     +---------+------------------+-----+-------------------+-----------------------+ +---------+------------------+-----+-------------------+-------+  Left      Lt Pressure (mmHg) Index Waveform            Comment   +---------+------------------+-----+-------------------+-------+  Brachial  140                      triphasic                    +---------+------------------+-----+-------------------+-------+  PTA       130                0.92  dampened monophasic          +---------+------------------+-----+-------------------+-------+  DP        151                1.06  dampened monophasic          +---------+------------------+-----+-------------------+-------+  Great Toe 29                 0.20                               +---------+------------------+-----+-------------------+-------+ +-------+-----------+----------------+------------+------------+  ABI/TBI Today's ABI Today's TBI      Previous ABI Previous TBI  +-------+-----------+----------------+------------+------------+  Right   1.73        Unable to obtain                            +-------+-----------+----------------+------------+------------+  Left    1.06        0.20                                        +-------+-----------+----------------+------------+------------+  Summary: Right - ABIs indicate non compressible arteries probably secondary to calcification. TBIs are unreliable - see comments in the chart listed above. Left - ABIs apperar normal however. ABIs are unreliable. Doppler waveforms and signals do not support the ABI value possible due to a falsely elevation of pressures. TBI is abnormal  *See table(s) above for measurements and observations.  Preliminary         Scheduled Meds:  sodium chloride   Intravenous Once   insulin aspart  0-15 Units Subcutaneous TID WC   insulin aspart  0-5 Units Subcutaneous QHS   insulin aspart  3 Units Subcutaneous TID WC   insulin detemir  8 Units Subcutaneous QHS   pantoprazole (PROTONIX) IV  40 mg Intravenous Q12H   sodium chloride flush  3 mL Intravenous Q12H   Continuous Infusions:  cefTRIAXone (ROCEPHIN)  IV Stopped (11/14/18 1900)   diltiazem (CARDIZEM) infusion     vancomycin        LOS: 2 days    Time spent: 36 MINUTES.     Kathlen ModyVijaya Jareth Pardee, MD [facility] Pager [phone]   If 7PM-7AM, please contact night-coverage [email or web address] Password [email or web address] 11/15/2018, 3:54 PM

## 2018-11-15 NOTE — Progress Notes (Signed)
New orders written for CMP, will continue to monitor, no other changes noted.

## 2018-11-15 NOTE — Progress Notes (Signed)
Patient states that he had a bad dream that woke him up but otherwise  He is asymptomatic, will continue to monitor.

## 2018-11-15 NOTE — Consult Note (Addendum)
Hospital Consult    Reason for Consult:  LLE ischemia Requesting Physician:  Dr. Blake DivineAkula MRN #:  [id number]  History of Present Illness: This is a 73 y.o. male who was admitted yesterday with foot wound that has been present for about 2 months.  [facility] when he noticed a scrape on his foot after doing yard work.  He was seen several times after that for progressive wounds.   He states that he was having a considerable amount of pain and tried to get some pain medication, but he couldn't get any and states he started abusing OTC pain meds to help control his pain.  He states that he really has not had cramping in his legs when he walks.    The pt is not on a statin for cholesterol management.  The pt is not on a daily aspirin.   Other AC:  none The pt is on ACEI for hypertension PTA  The pt is diabetic.  Oral agent Tobacco hx:  never  Past Medical History:  Diagnosis Date  . Diabetes mellitus without complication (HCC)   . Hypertension     Past Surgical History:  Procedure Laterality Date  . HAND SURGERY      No Known Allergies  Prior to Admission medications   Medication Sig Start Date End Date Taking? Authorizing Provider  Aspirin-Salicylamide-Caffeine (BC HEADACHE POWDER PO) Take 4 packets by mouth 4 (four) times daily as needed (for pain).    Yes [provider]  glipiZIDE (GLUCOTROL) 5 MG tablet Take 1 tablet (5 mg total) by mouth daily before breakfast. 10/17/18  Yes McClung, Angela M, PA-C  HYDROcodone-acetaminophen (NORCO/VICODIN) 5-325 MG tablet Take 1 tablet by mouth every 6 (six) hours as needed for severe pain. 10/08/18  Yes Zadie RhineWickline, Donald, MD  lisinopril-hydrochlorothiazide (ZESTORETIC) 20-12.5 MG tablet Take 1 tablet by mouth daily. 10/17/18  Yes Georgian CoMcClung, Angela M, PA-C  metFORMIN (GLUCOPHAGE) 1000 MG tablet Take 1 tablet (1,000 mg total) by mouth 2 (two) times daily. 10/17/18  Yes McClung, Angela M, PA-C  Multiple Vitamins-Minerals  (ONE-A-DAY MENS 50+ ADVANTAGE) TABS Take 1 tablet by mouth daily with breakfast.   Yes [provider]  naproxen sodium (ALEVE) 220 MG tablet Take 440 mg by mouth 2 (two) times daily as needed (for pain).   Yes [provider]  mupirocin ointment (BACTROBAN) 2 % Apply bid to wound for 10 dyas Patient not taking: Reported on 11/13/2018 10/17/18   Anders SimmondsMcClung, Angela M, PA-C    Social History   Socioeconomic History  . Marital status: Single    Spouse name: Not on file  . Number of children: Not on file  . Years of education: Not on file  . Highest education level: Not on file  Occupational History  . Not on file  Social Needs  . Financial resource strain: Not on file  . Food insecurity    Worry: Not on file    Inability: Not on file  . Transportation needs    Medical: Not on file    Non-medical: Not on file  Tobacco Use  . Smoking status: Never Smoker  . Smokeless tobacco: Never Used  Substance and Sexual Activity  . Alcohol use: Yes  . Drug use: No  . Sexual activity: Not on file  Lifestyle  . Physical activity    Days per week: Not on file    Minutes per session: Not on file  . Stress: Not on file  Relationships  . Social Musicianconnections    Talks on phone: Not on file    Gets together: Not on file    Attends religious service: Not on file    Active member of club or organization: Not on file    Attends meetings of clubs or organizations: Not on file    Relationship status: Not on file  . Intimate partner violence    Fear of current or ex partner: Not on file    Emotionally abused: Not on file    Physically abused: Not on file    Forced sexual activity: Not on file  Other Topics Concern  . Not on file  Social History Narrative  . Not on file    Family History  Problem Relation Age of Onset  . Diabetes Father     ROS: [x]  Positive   [ ]  Negative   [ ]  All sytems reviewed and are negative Cardiac: []  chest pain/pressure [x]  High blood pressure [x]   afib  Vascular: []  pain in legs while walking []  pain in legs at rest []  pain in legs at night [x]  non-healing ulcers []  hx of DVT []  swelling in legs  Pulmonary: []  productive cough []  asthma/wheezing []  home O2  Neurologic: []  CVA  Hematologic: []  hx of cancer  Endocrine:   [x]  diabetes []  thyroid disease  GI []  vomiting blood []  blood in stool  GU: []  CKD/renal failure []  HD--[]  M/W/F or []  T/T/S  Psychiatric: []  anxiety []  depression  Musculoskeletal: []  arthritis []  joint pain  Integumentary: []  rashes [x]  ulcers  Constitutional: []  fever []  chills   Physical Examination  Vitals:   11/14/18 2004 11/15/18 0528  BP: 128/72 137/75  Pulse: 85 82  Resp:    Temp: 98.6 F (37 C) 98.9 F (37.2 C)  SpO2: 100% 98%   Body mass index is 25.06 kg/m.  General:  WDWN in NAD Gait: Not observed HENT: WNL, normocephalic Pulmonary: normal non-labored breathing, without Rales, rhonchi,  wheezing Cardiac: irregular, without  Murmurs, rubs or gallops; without carotid bruits Abdomen:  soft, NT/ND, no masses Skin: without rashes Vascular Exam/Pulses:  Right Left  Radial 1+ (weak) 1+ (weak)  Ulnar Unable to palpate  Unable to palpate   Femoral 1+ (weak) 1+ (weak)  Popliteal Unable to palpate  Unable to palpate   AT +doppler signal absent  PT Brisk monophasic doppler signald absent  Peroneal Brisk monophasic doppler signal + monophasic doppler signal   Extremities: left foot unremarkable without ulcers/wounds. Right foot as below.  Malodorous.        Musculoskeletal: no muscle wasting or atrophy  Neurologic: A&O X 3;  No focal weakness or paresthesias are detected; speech is fluent/normal Psychiatric:  The pt has Normal affect. Lymph:  No inguinal lymphadenopathy   CBC    Component Value Date/Time   WBC 36.6 (H) 11/14/2018 1141   RBC 3.16 (L) 11/14/2018 1141   RBC 3.16 (L) 11/14/2018 1141   HGB 9.5 (L) 11/14/2018 1141   HCT 28.6 (L)  11/14/2018 1141   PLT 321 11/14/2018 1141   MCV 90.5 11/14/2018 1141   MCH 30.1 11/14/2018 1141   MCHC 33.2 11/14/2018 1141   RDW 13.6 11/14/2018 1141   LYMPHSABS 3.6 11/13/2018 1832   MONOABS 1.8 (H) 11/13/2018 1832   EOSABS 0.0 11/13/2018 1832   BASOSABS 0.0 11/13/2018 1832    BMET    Component Value Date/Time   NA 142 11/15/2018 0458   K 4.1 11/15/2018 0458  CL 112 (H) 11/15/2018 0458   CO2 20 (L) 11/15/2018 0458   GLUCOSE 206 (H) 11/15/2018 0458   BUN 40 (H) 11/15/2018 0458   CREATININE 1.38 (H) 11/15/2018 0458   CREATININE 1.07 02/12/2013 1549   CALCIUM 8.7 (L) 11/15/2018 0458   GFRNONAA 51 (L) 11/15/2018 0458   GFRAA 59 (L) 11/15/2018 0458    COAGS: No results found for: INR, PROTIME   Non-Invasive Vascular Imaging:   ABI's 11/14/2018: Right:  1.73 Left:  1.06 Summary: Right - ABIs indicate non compressible arteries probably secondary to calcification. TBIs are unreliable - see comments in the chart listed above.  Left - ABIs apperar normal however. ABIs are unreliable. Doppler waveforms and signals do not support the ABI value possible due to a falsely elevation of pressures. TBI is abnormal    ASSESSMENT/PLAN: This is a 73 y.o. male with non healing ulcer right foot  -pt with right foot wound that is not salvageable. Doppler signals in right foot brisk monophasic PT/AT/peroneal.  Most likely would heal right BKA. -renal function improving-creatinine on 6/16 was 3.15-today is down to 1.38.   -pt states should anything happen to him, he would like his ex wife notified Lucina Mellow).    Leontine Locket, PA-C [facility] [phone]  I have independently interviewed and examined patient and agree with PA assessment and plan above.  Right foot does not appear salvageable and will undergo below-knee amputation.  I will have him follow-up in our office in 3 months to follow his peripheral arterial disease particularly on his left lower  extremity where he has a toe pressure of 20 but currently no wounds.  Patient should be on aspirin and statin.  Sherina Stammer C. Donzetta Matters, MD [facility] [place] Office: [phone] Pager: [phone]

## 2018-11-15 NOTE — Progress Notes (Signed)
Inpatient Diabetes Program Recommendations  AACE/ADA: New Consensus Statement on Inpatient Glycemic Control (2015)  Target Ranges:  Prepandial:   less than 140 mg/dL      Peak postprandial:   less than 180 mg/dL (1-2 hours)      Critically ill patients:  140 - 180 mg/dL   Lab Results  Component Value Date   GLUCAP 385 (H) 11/15/2018   HGBA1C 5.7 10/17/2018    Review of Glycemic Control Results for [place], [name] (MRN [id number]) as of 11/15/2018 14:09  Ref. Range 11/14/2018 17:31 11/14/2018 21:44 11/15/2018 07:39 11/15/2018 11:38  Glucose-Capillary Latest Ref Range: 70 - 99 mg/dL 190 (H) 205 (H) 203 (H) 385 (H)   Diabetes history: DM2 Outpatient Diabetes medications: Metformin 1 gm bid + Glucotrol 5 mg qd Current orders for Inpatient glycemic control: Novolog 3 units tid meal coverage if eats 50% + Novolog moderate correction tid  Inpatient Diabetes Program Recommendations:   Consider Levemir 10 units daily (0.1 unit/kg x 93.4 kg =9.34 units)  Thank you, Bethena Roys E. Eyoel Throgmorton, RN, MSN, CDE  Diabetes Coordinator Inpatient Glycemic Control Team Team Pager [phone] (8am-5pm) 11/15/2018 2:15 PM

## 2018-11-15 NOTE — Progress Notes (Signed)
Patient had a 5 beat run of V-tach but was asymptomatic with stable VS, will notify MD and continue to monitor.

## 2018-11-16 ENCOUNTER — Inpatient Hospital Stay (HOSPITAL_COMMUNITY): Payer: Medicare Other | Admitting: Anesthesiology

## 2018-11-16 ENCOUNTER — Encounter (HOSPITAL_COMMUNITY)
Admission: EM | Disposition: A | Discharge status: Rehabilitation Facility | Payer: Self-pay | Source: Home / Self Care | Attending: Internal Medicine

## 2018-11-16 ENCOUNTER — Encounter (HOSPITAL_COMMUNITY): Payer: Self-pay | Admitting: Anesthesiology

## 2018-11-16 ENCOUNTER — Inpatient Hospital Stay (HOSPITAL_COMMUNITY): Payer: Medicare Other

## 2018-11-16 DIAGNOSIS — I361 Nonrheumatic tricuspid (valve) insufficiency: Secondary | ICD-10-CM

## 2018-11-16 HISTORY — PX: AMPUTATION: SHX166

## 2018-11-16 LAB — ECHOCARDIOGRAM COMPLETE
Height: 76 in
Weight: 3280.44 oz

## 2018-11-16 LAB — CBC
HCT: 26.5 % — ABNORMAL LOW (ref 39.0–52.0)
Hemoglobin: 8.7 g/dL — ABNORMAL LOW (ref 13.0–17.0)
MCH: 30.1 pg (ref 26.0–34.0)
MCHC: 32.8 g/dL (ref 30.0–36.0)
MCV: 91.7 fL (ref 80.0–100.0)
Platelets: 362 10*3/uL (ref 150–400)
RBC: 2.89 MIL/uL — ABNORMAL LOW (ref 4.22–5.81)
RDW: 14 % (ref 11.5–15.5)
WBC: 27 10*3/uL — ABNORMAL HIGH (ref 4.0–10.5)
nRBC: 0 % (ref 0.0–0.2)

## 2018-11-16 LAB — TROPONIN I: Troponin I: 0.03 ng/mL (ref <0.03)

## 2018-11-16 LAB — BASIC METABOLIC PANEL
Anion gap: 12 (ref 5–15)
BUN: 31 mg/dL — ABNORMAL HIGH (ref 8–23)
CO2: 19 mmol/L — ABNORMAL LOW (ref 22–32)
Calcium: 8.7 mg/dL — ABNORMAL LOW (ref 8.9–10.3)
Chloride: 109 mmol/L (ref 98–111)
Creatinine, Ser: 1.32 mg/dL — ABNORMAL HIGH (ref 0.61–1.24)
GFR calc Af Amer: >60 mL/min (ref >60)
GFR calc non Af Amer: 54 mL/min — ABNORMAL LOW (ref >60)
Glucose, Bld: 251 mg/dL — ABNORMAL HIGH (ref 70–99)
Potassium: 3.7 mmol/L (ref 3.5–5.1)
Sodium: 140 mmol/L (ref 135–145)

## 2018-11-16 LAB — GLUCOSE, CAPILLARY
Glucose-Capillary: 157 mg/dL — ABNORMAL HIGH (ref 70–99)
Glucose-Capillary: 164 mg/dL — ABNORMAL HIGH (ref 70–99)
Glucose-Capillary: 171 mg/dL — ABNORMAL HIGH (ref 70–99)
Glucose-Capillary: 161 mg/dL — ABNORMAL HIGH (ref 70–99)
Glucose-Capillary: 109 mg/dL — ABNORMAL HIGH (ref 70–99)

## 2018-11-16 SURGERY — AMPUTATION BELOW KNEE
Anesthesia: Regional | Site: Leg Lower | Laterality: Right

## 2018-11-16 MED ORDER — MIDAZOLAM HCL 2 MG/2ML IJ SOLN
INTRAMUSCULAR | Status: AC
  Filled 2018-11-16: qty 2

## 2018-11-16 MED ORDER — FENTANYL CITRATE (PF) 250 MCG/5ML IJ SOLN
INTRAMUSCULAR | Status: DC | PRN
  Administered 2018-11-16: 50 ug via INTRAVENOUS

## 2018-11-16 MED ORDER — AMIODARONE HCL 200 MG PO TABS
200.0000 mg | ORAL_TABLET | Freq: Two times a day (BID) | ORAL | Status: DC
  Administered 2018-11-16 – 2018-11-20 (×9): 200 mg via ORAL
  Filled 2018-11-16 (×9): qty 1

## 2018-11-16 MED ORDER — FENTANYL CITRATE (PF) 100 MCG/2ML IJ SOLN
INTRAMUSCULAR | Status: AC
  Administered 2018-11-16: 100 ug via INTRAVENOUS
  Filled 2018-11-16: qty 2

## 2018-11-16 MED ORDER — DOCUSATE SODIUM 100 MG PO CAPS
100.0000 mg | ORAL_CAPSULE | Freq: Two times a day (BID) | ORAL | Status: DC
  Administered 2018-11-16 – 2018-11-20 (×8): 100 mg via ORAL
  Filled 2018-11-16 (×9): qty 1

## 2018-11-16 MED ORDER — METOCLOPRAMIDE HCL 5 MG PO TABS: 5.0000 mg | ORAL_TABLET | Freq: Three times a day (TID) | ORAL | Status: DC | PRN

## 2018-11-16 MED ORDER — LIDOCAINE HCL (CARDIAC) PF 100 MG/5ML IV SOSY
PREFILLED_SYRINGE | INTRAVENOUS | Status: DC | PRN
  Administered 2018-11-16: 80 mg via INTRAVENOUS

## 2018-11-16 MED ORDER — GABAPENTIN 300 MG PO CAPS
300.0000 mg | ORAL_CAPSULE | Freq: Three times a day (TID) | ORAL | Status: DC
  Administered 2018-11-16 – 2018-11-20 (×13): 300 mg via ORAL
  Filled 2018-11-16 (×13): qty 1

## 2018-11-16 MED ORDER — METHOCARBAMOL 1000 MG/10ML IJ SOLN
500.0000 mg | Freq: Four times a day (QID) | INTRAVENOUS | Status: DC | PRN
  Filled 2018-11-16: qty 5

## 2018-11-16 MED ORDER — CEFAZOLIN SODIUM-DEXTROSE 1-4 GM/50ML-% IV SOLN: 1.0000 g | Freq: Four times a day (QID) | INTRAVENOUS | Status: DC

## 2018-11-16 MED ORDER — LACTATED RINGERS IV SOLN
INTRAVENOUS | Status: DC | PRN
  Administered 2018-11-16: 09:00:00 via INTRAVENOUS

## 2018-11-16 MED ORDER — ONDANSETRON HCL 4 MG/2ML IJ SOLN: 4.0000 mg | Freq: Four times a day (QID) | INTRAMUSCULAR | Status: DC | PRN

## 2018-11-16 MED ORDER — METHOCARBAMOL 500 MG PO TABS
500.0000 mg | ORAL_TABLET | Freq: Four times a day (QID) | ORAL | Status: DC | PRN
  Administered 2018-11-16: 500 mg via ORAL
  Filled 2018-11-16: qty 1

## 2018-11-16 MED ORDER — FENTANYL CITRATE (PF) 250 MCG/5ML IJ SOLN
INTRAMUSCULAR | Status: AC
  Filled 2018-11-16: qty 5

## 2018-11-16 MED ORDER — PROPOFOL 10 MG/ML IV BOLUS
INTRAVENOUS | Status: AC
  Filled 2018-11-16: qty 20

## 2018-11-16 MED ORDER — SODIUM CHLORIDE 0.9 % IV SOLN
INTRAVENOUS | Status: DC
  Administered 2018-11-16: 18:00:00 via INTRAVENOUS

## 2018-11-16 MED ORDER — POLYETHYLENE GLYCOL 3350 17 G PO PACK: 17.0000 g | PACK | Freq: Every day | ORAL | Status: DC | PRN

## 2018-11-16 MED ORDER — ACETAMINOPHEN 325 MG PO TABS
325.0000 mg | ORAL_TABLET | Freq: Four times a day (QID) | ORAL | Status: DC | PRN
  Administered 2018-11-17: 650 mg via ORAL
  Filled 2018-11-16: qty 2

## 2018-11-16 MED ORDER — MAGNESIUM CITRATE PO SOLN: 1.0000 | Freq: Once | ORAL | Status: DC | PRN

## 2018-11-16 MED ORDER — 0.9 % SODIUM CHLORIDE (POUR BTL) OPTIME
TOPICAL | Status: DC | PRN
  Administered 2018-11-16: 1000 mL

## 2018-11-16 MED ORDER — CLONIDINE HCL (ANALGESIA) 100 MCG/ML EP SOLN
EPIDURAL | Status: DC | PRN
  Administered 2018-11-16 (×2): 50 ug

## 2018-11-16 MED ORDER — OXYCODONE HCL 5 MG PO TABS
5.0000 mg | ORAL_TABLET | ORAL | Status: DC | PRN
  Administered 2018-11-16 – 2018-11-19 (×6): 10 mg via ORAL
  Filled 2018-11-16 (×4): qty 2

## 2018-11-16 MED ORDER — ACETAMINOPHEN 500 MG PO TABS
1000.0000 mg | ORAL_TABLET | Freq: Once | ORAL | Status: AC
  Administered 2018-11-16: 1000 mg via ORAL
  Filled 2018-11-16: qty 2

## 2018-11-16 MED ORDER — OXYCODONE HCL 5 MG PO TABS
10.0000 mg | ORAL_TABLET | ORAL | Status: DC | PRN
  Administered 2018-11-17 – 2018-11-20 (×9): 15 mg via ORAL
  Filled 2018-11-16 (×10): qty 3

## 2018-11-16 MED ORDER — EPHEDRINE SULFATE 50 MG/ML IJ SOLN
INTRAMUSCULAR | Status: DC | PRN
  Administered 2018-11-16 (×2): 10 mg via INTRAVENOUS

## 2018-11-16 MED ORDER — ONDANSETRON HCL 4 MG/2ML IJ SOLN
INTRAMUSCULAR | Status: DC | PRN
  Administered 2018-11-16: 4 mg via INTRAVENOUS

## 2018-11-16 MED ORDER — OXYCODONE HCL 5 MG PO TABS
ORAL_TABLET | ORAL | Status: AC
  Filled 2018-11-16: qty 2

## 2018-11-16 MED ORDER — PHENYLEPHRINE HCL (PRESSORS) 10 MG/ML IV SOLN
INTRAVENOUS | Status: DC | PRN
  Administered 2018-11-16 (×2): 80 ug via INTRAVENOUS

## 2018-11-16 MED ORDER — BISACODYL 10 MG RE SUPP: 10.0000 mg | Freq: Every day | RECTAL | Status: DC | PRN

## 2018-11-16 MED ORDER — METOCLOPRAMIDE HCL 5 MG/ML IJ SOLN: 5.0000 mg | Freq: Three times a day (TID) | INTRAMUSCULAR | Status: DC | PRN

## 2018-11-16 MED ORDER — ONDANSETRON HCL 4 MG PO TABS: 4.0000 mg | ORAL_TABLET | Freq: Four times a day (QID) | ORAL | Status: DC | PRN

## 2018-11-16 MED ORDER — PROPOFOL 10 MG/ML IV BOLUS
INTRAVENOUS | Status: DC | PRN
  Administered 2018-11-16: 120 mg via INTRAVENOUS

## 2018-11-16 MED ORDER — HYDROMORPHONE HCL 1 MG/ML IJ SOLN: 0.5000 mg | INTRAMUSCULAR | Status: DC | PRN

## 2018-11-16 MED ORDER — FENTANYL CITRATE (PF) 100 MCG/2ML IJ SOLN
INTRAMUSCULAR | Status: AC
  Filled 2018-11-16: qty 2

## 2018-11-16 MED ORDER — ROPIVACAINE HCL 5 MG/ML IJ SOLN
INTRAMUSCULAR | Status: DC | PRN
  Administered 2018-11-16: 30 mL via PERINEURAL
  Administered 2018-11-16: 15 mL via PERINEURAL

## 2018-11-16 MED ORDER — FENTANYL CITRATE (PF) 100 MCG/2ML IJ SOLN
25.0000 ug | INTRAMUSCULAR | Status: DC | PRN
  Administered 2018-11-16: 25 ug via INTRAVENOUS

## 2018-11-16 MED ORDER — FENTANYL CITRATE (PF) 100 MCG/2ML IJ SOLN
100.0000 ug | Freq: Once | INTRAMUSCULAR | Status: AC
  Administered 2018-11-16: 09:00:00 100 ug via INTRAVENOUS

## 2018-11-16 SURGICAL SUPPLY — 36 items
BLADE SAW RECIP 87.9 MT (BLADE) ×2 IMPLANT
BLADE SURG 21 STRL SS (BLADE) ×2 IMPLANT
BNDG COHESIVE 6X5 TAN STRL LF (GAUZE/BANDAGES/DRESSINGS) IMPLANT
CANISTER WOUND CARE 500ML ATS (WOUND CARE) ×2 IMPLANT
COVER SURGICAL LIGHT HANDLE (MISCELLANEOUS) ×2 IMPLANT
COVER WAND RF STERILE (DRAPES) IMPLANT
CUFF TOURNIQUET SINGLE 34IN LL (TOURNIQUET CUFF) ×2 IMPLANT
DRAPE INCISE IOBAN 66X45 STRL (DRAPES) ×2 IMPLANT
DRAPE U-SHAPE 47X51 STRL (DRAPES) ×2 IMPLANT
DRESSING PREVENA PLUS CUSTOM (GAUZE/BANDAGES/DRESSINGS) ×1 IMPLANT
DRSG PREVENA PLUS CUSTOM (GAUZE/BANDAGES/DRESSINGS) ×2
DURAPREP 26ML APPLICATOR (WOUND CARE) ×2 IMPLANT
ELECT REM PT RETURN 9FT ADLT (ELECTROSURGICAL) ×2
ELECTRODE REM PT RTRN 9FT ADLT (ELECTROSURGICAL) ×1 IMPLANT
GLOVE BIOGEL PI IND STRL 9 (GLOVE) ×1 IMPLANT
GLOVE BIOGEL PI INDICATOR 9 (GLOVE) ×1
GLOVE SURG ORTHO 9.0 STRL STRW (GLOVE) ×2 IMPLANT
GOWN STRL REUS W/ TWL XL LVL3 (GOWN DISPOSABLE) ×2 IMPLANT
GOWN STRL REUS W/TWL XL LVL3 (GOWN DISPOSABLE) ×4
KIT BASIN OR (CUSTOM PROCEDURE TRAY) ×2 IMPLANT
KIT TURNOVER KIT B (KITS) ×2 IMPLANT
MANIFOLD NEPTUNE II (INSTRUMENTS) ×2 IMPLANT
NS IRRIG 1000ML POUR BTL (IV SOLUTION) ×2 IMPLANT
PACK ORTHO EXTREMITY (CUSTOM PROCEDURE TRAY) ×2 IMPLANT
PAD ARMBOARD 7.5X6 YLW CONV (MISCELLANEOUS) ×2 IMPLANT
PREVENA RESTOR ARTHOFORM 46X30 (CANNISTER) ×2 IMPLANT
SPONGE LAP 18X18 RF (DISPOSABLE) IMPLANT
STAPLER VISISTAT 35W (STAPLE) IMPLANT
STOCKINETTE IMPERVIOUS LG (DRAPES) ×2 IMPLANT
SUT ETHILON 2 0 PSLX (SUTURE) IMPLANT
SUT SILK 2 0 (SUTURE) ×2
SUT SILK 2-0 18XBRD TIE 12 (SUTURE) ×1 IMPLANT
SUT VIC AB 1 CTX 27 (SUTURE) ×4 IMPLANT
TOWEL OR 17X26 10 PK STRL BLUE (TOWEL DISPOSABLE) ×2 IMPLANT
TUBE CONNECTING 12X1/4 (SUCTIONS) ×2 IMPLANT
YANKAUER SUCT BULB TIP NO VENT (SUCTIONS) ×2 IMPLANT

## 2018-11-16 NOTE — Anesthesia Procedure Notes (Signed)
Procedure Name: LMA Insertion Date/Time: 11/16/2018 9:22 AM Performed by: Julieta Bellini, CRNA Pre-anesthesia Checklist: Patient identified, Emergency Drugs available and Suction available Patient Re-evaluated:Patient Re-evaluated prior to induction Oxygen Delivery Method: Circle system utilized Preoxygenation: Pre-oxygenation with 100% oxygen Induction Type: IV induction Ventilation: Mask ventilation without difficulty LMA: LMA inserted LMA Size: 5.0 Number of attempts: 1 Placement Confirmation: positive ETCO2,  CO2 detector and breath sounds checked- equal and bilateral Tube secured with: Tape Dental Injury: Teeth and Oropharynx as per pre-operative assessment

## 2018-11-16 NOTE — Progress Notes (Signed)
Progress Note  Patient Name: Joseph [name] Date of Encounter: 11/16/2018  Primary Cardiologist: Dietrich PatesPaula Ponciano Shealy, MD NEW  Subjective   No SOB  No CP  Feeling better since surgery   Inpatient Medications    Scheduled Meds:  sodium chloride   Intravenous Once   chlorhexidine  60 mL Topical Once   glipiZIDE  5 mg Oral QAC breakfast   insulin aspart  0-15 Units Subcutaneous TID WC   insulin aspart  0-5 Units Subcutaneous QHS   insulin aspart  3 Units Subcutaneous TID WC   insulin detemir  8 Units Subcutaneous QHS   pantoprazole (PROTONIX) IV  40 mg Intravenous Q12H   povidone-iodine  2 application Topical Once   sodium chloride flush  3 mL Intravenous Q12H   Continuous Infusions:  amiodarone 30 mg/hr (11/16/18 0231)    ceFAZolin (ANCEF) IV     cefTRIAXone (ROCEPHIN)  IV 2 g (11/15/18 1719)   vancomycin 1,250 mg (11/16/18 0022)   PRN Meds: acetaminophen **OR** acetaminophen, HYDROmorphone (DILAUDID) injection, ondansetron **OR** ondansetron (ZOFRAN) IV, oxyCODONE   Vital Signs    Vitals:   11/14/18 2004 11/15/18 0528 11/15/18 1437 11/15/18 2115  BP: 128/72 137/75 107/67 104/75  Pulse: 85 82 (!) 112 83  Resp:   20   Temp: 98.6 F (37 C) 98.9 F (37.2 C) 98.5 F (36.9 C) 98.8 F (37.1 C)  TempSrc: Oral Oral Oral   SpO2: 100% 98% 100% 99%  Weight:  93.4 kg    Height:        Intake/Output Summary (Last 24 hours) at 11/16/2018 0633 Last data filed at 11/16/2018 0300 Gross per 24 hour  Intake 1509.58 ml  Output 1125 ml  Net 384.58 ml   Last 3 Weights 11/15/2018 11/13/2018 11/13/2018  Weight (lbs) 205 lb 14.4 oz 240 lb 1.3 oz 240 lb  Weight (kg) 93.396 kg 108.9 kg 108.863 kg      Telemetry    SR  - Personally Reviewed  ECG    Not done   - Personally Reviewed  Physical Exam   GEN: No acute distress.   Neck: No JVD Cardiac: RRR, no murmurs, rubs, or gallops.  Respiratory: Clear to auscultation bilaterally. GI: Soft, nontender, non-distended  MS:  s/p R BKA   Neuro:  Nonfocal  Psych: Normal affect   Labs    Chemistry Recent Labs  Lab 11/13/18 1832 11/14/18 0006 11/14/18 1923 11/15/18 0458 11/16/18 0145  NA 130* 135  --  142 140  K 4.1 4.0  --  4.1 3.7  CL 97* 107  --  112* 109  CO2 14* 17*  --  20* 19*  GLUCOSE 271* 225*  --  206* 251*  BUN 76* 68*  --  40* 31*  CREATININE 3.15* 2.35* 1.60* 1.38* 1.32*  CALCIUM 9.0 8.0*  --  8.7* 8.7*  PROT 7.7  --   --  6.9  --   ALBUMIN 2.6*  --   --  2.2*  --   AST 32  --   --  21  --   ALT 22  --   --  20  --   ALKPHOS 78  --   --  67  --   BILITOT 0.6  --   --  0.5  --   GFRNONAA 19* 27* 43* 51* 54*  GFRAA 22* 31* 49* 59* >60  ANIONGAP 19* 11  --  10 12     Hematology Recent Labs  Lab 11/14/18  1141 11/15/18 1117 11/16/18 0145  WBC 36.6* 30.5* 27.0*  RBC 3.16*   3.16* 3.16* 2.89*  HGB 9.5* 9.3* 8.7*  HCT 28.6* 28.2* 26.5*  MCV 90.5 89.2 91.7  MCH 30.1 29.4 30.1  MCHC 33.2 33.0 32.8  RDW 13.6 13.6 14.0  PLT 321 366 362    Cardiac Enzymes Recent Labs  Lab 11/15/18 1411 11/15/18 1930 11/16/18 0145  TROPONINI 0.03* 0.03* 0.03*   No results for input(s): TROPIPOC in the last 168 hours.   BNPNo results for input(s): BNP, PROBNP in the last 168 hours.   DDimer No results for input(s): DDIMER in the last 168 hours.   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 Subsequent encounter. EXAM: MRI OF THE RIGHT FOREFOOT WITHOUT CONTRAST TECHNIQUE: Multiplanar, multisequence MR imaging of the right forefoot was performed. No intravenous contrast was administered. COMPARISON:  Plain films right foot 09/12/2018, 09/20/2018 and 11/13/2018. FINDINGS: Bones/Joint/Cartilage There is edema in the distal 4 cm of the fifth metatarsal and throughout the fifth toe consistent with  osteomyelitis. Gas is seen in the fifth metatarsal head. Bone marrow signal is otherwise unremarkable. Ligaments Intact. Muscles and Tendons Intact.  No intramuscular fluid collection. Soft tissues A large skin ulceration over the distal fifth metatarsal and proximal fifth toe is identified. Bone appears exposed. No abscess. IMPRESSION: Large skin ulceration along the lateral aspect of the distal fifth metatarsal proximal fifth toe with findings consistent with osteomyelitis in the distal 4 cm of the fifth metatarsal and throughout the fifth toe. 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 Negative for abscess or septic joint. Electronically Signed   By: Drusilla Kannerhomas  Dalessio M.D.   On: 11/14/2018 12:29   Vas [place] Vanice Sarahbi With/wo Tbi  Result Date: 11/15/2018 LOWER EXTREMITY DOPPLER STUDY Indications: Gangrene, and Dry gangrene right foot. Diminishsed pulses              bilaterally. High Risk Factors: Diabetes.  Comparison Study: No comparison study available Performing Technologist: Milta DeitersSlaughter, [place] [facility]  Examination Guidelines: A complete evaluation includes at minimum, Doppler waveform signals and systolic blood pressure reading at the level of bilateral brachial, anterior tibial, and posterior tibial arteries, when vessel segments are accessible. Bilateral testing is considered an integral part of a complete examination. Photoelectric Plethysmograph (PPG) waveforms and toe systolic pressure readings are included as required and additional duplex testing as needed. Limited examinations for reoccurring indications may be performed as noted.  ABI Findings: +---------+------------------+-----+-------------------+-----------------------+  Right     Rt Pressure (mmHg) Index Waveform            Comment                  +---------+------------------+-----+-------------------+-----------------------+  Brachial  142                      triphasic                                     +---------+------------------+-----+-------------------+-----------------------+  PTA       116                0.82  dampened monophasic                          +---------+------------------+-----+-------------------+-----------------------+  DP        245                1.73  dampened monophasic                          +---------+------------------+-----+-------------------+-----------------------+  Great Toe                                              Unable to fully                                                                  evaluate due to the                                                              size of the toe and the                                                          cuff                     +---------+------------------+-----+-------------------+-----------------------+ +---------+------------------+-----+-------------------+-------+  Left      Lt Pressure (mmHg) Index Waveform            Comment  +---------+------------------+-----+-------------------+-------+  Brachial  140                      triphasic                    +---------+------------------+-----+-------------------+-------+  PTA       130                0.92  dampened monophasic          +---------+------------------+-----+-------------------+-------+  DP        151                1.06  dampened monophasic          +---------+------------------+-----+-------------------+-------+  Great Toe 29                 0.20                               +---------+------------------+-----+-------------------+-------+ +-------+-----------+----------------+------------+------------+  ABI/TBI Today's ABI Today's TBI      Previous ABI Previous TBI  +-------+-----------+----------------+------------+------------+  Right   1.73        Unable to obtain                            +-------+-----------+----------------+------------+------------+  Left    1.06        0.20                                         +-------+-----------+----------------+------------+------------+  Summary: Right - ABIs indicate non compressible arteries probably secondary to calcification. TBIs are unreliable - see comments in the chart listed above. Left - ABIs apperar normal however. ABIs are unreliable. Doppler waveforms and signals do not support the ABI value possible due to a falsely elevation of pressures. TBI is abnormal  *See table(s) above for measurements and observations.  Electronically signed by Lemar LivingsBrandon Cain MD on 11/15/2018 at 4:09:15 PM.    Final     Cardiac Studies     Patient Profile     73 y.o. male male with a hx of DM (A1c 5.7), HTN, CKD II, foot wound who is being seen today for the evaluation of atrial fib at the request of Dr. Blake DivineAkula. He has no prior cardiac history  Assessment & Plan    1  Atrial fibrillatoin Newly documented this admit  Was in and out  Did not sense  CHADSVasc is 4   Not a candidate for anticoagulatoin due to anemia   Goal to remain in SR   Episode may be related to stressors of gangrenous foot  Would recomm though maintaining on amiodarone in short term with f/u as outpt to ensure that he holds sinus   He Remains in SR on amiodarone  WOuld switch to PO amiodarone 200 bid    Keep on this dose   Will follow as outpatient and taper / d/c over time  Probable event monitor prior to d/c   Echo pending from today   Note he is 6 L positive  (accurate I/O)  Exam  Volume up some, does not appear bad     2  PAD   Plan for amputation today due to gangrene  3  Anemia  Transfused 2 days ago  Hgb prior to tx was 6.7  Now 8.7  Follow    4  Hx HTN  BP is OK    5  Hx DM     Will need outpt f/u after d/c   Will set today   For questions or updates, please contact [facility] Please consult [email or web address] for contact info under        Signed, Dietrich PatesPaula Mazikeen Hehn, MD  11/16/2018, 6:33 AM

## 2018-11-16 NOTE — Transfer of Care (Signed)
Immediate Anesthesia Transfer of Care Note  Patient: Joseph [name]  Procedure(s) Performed: RIGHT BELOW KNEE AMPUTATION (Right Leg Lower)  Patient Location: PACU  Anesthesia Type:General  Level of Consciousness: drowsy and patient cooperative  Airway & Oxygen Therapy: Patient Spontanous Breathing and Patient connected to nasal cannula oxygen  Post-op Assessment: Report given to RN, Post -op Vital signs reviewed and stable and Patient moving all extremities X 4  Post vital signs: Reviewed and stable  Last Vitals:  Vitals Value Taken Time  BP 112/65 11/16/18 0951  Temp    Pulse 72 11/16/18 0953  Resp 14 11/16/18 0953  SpO2 100 % 11/16/18 0953  Vitals shown include unvalidated device data.  Last Pain:  Vitals:   11/16/18 0636  TempSrc: Oral  PainSc:       Patients Stated Pain Goal: 0 (99/24/26 8341)  Complications: No apparent anesthesia complications

## 2018-11-16 NOTE — Anesthesia Preprocedure Evaluation (Addendum)
Anesthesia Evaluation  Patient identified by MRN, date of birth, ID band Patient awake    Reviewed: Allergy & Precautions, NPO status , Patient's Chart, lab work & pertinent test results  History of Anesthesia Complications (+) PONV  Airway Mallampati: III  TM Distance: >3 FB Neck ROM: Full  Mouth opening: Limited Mouth Opening  Dental no notable dental hx. (+) Edentulous Upper, Poor Dentition, Dental Advisory Given,    Pulmonary neg pulmonary ROS,    Pulmonary exam normal breath sounds clear to auscultation       Cardiovascular hypertension, + Peripheral Vascular Disease  Normal cardiovascular exam+ dysrhythmias (on amiodarone infusion) Atrial Fibrillation and Supra Ventricular Tachycardia  Rhythm:Regular Rate:Normal     Neuro/Psych negative neurological ROS  negative psych ROS   GI/Hepatic negative GI ROS, Neg liver ROS,   Endo/Other  negative endocrine ROSdiabetes, Oral Hypoglycemic Agents  Renal/GU negative Renal ROS  negative genitourinary   Musculoskeletal negative musculoskeletal ROS (+)   Abdominal   Peds  Hematology  (+) Blood dyscrasia, anemia ,   Anesthesia Other Findings   Reproductive/Obstetrics                            Anesthesia Physical Anesthesia Plan  ASA: IV  Anesthesia Plan: General and Regional   Post-op Pain Management:  Regional for Post-op pain   Induction: Intravenous  PONV Risk Score and Plan: 2 and Ondansetron and Treatment may vary due to age or medical condition  Airway Management Planned: LMA  Additional Equipment:   Intra-op Plan:   Post-operative Plan: Extubation in OR  Informed Consent: I have reviewed the patients History and Physical, chart, labs and discussed the procedure including the risks, benefits and alternatives for the proposed anesthesia with the patient or authorized representative who has indicated his/her understanding and  acceptance.     Dental advisory given  Plan Discussed with: CRNA  Anesthesia Plan Comments:        Anesthesia Quick Evaluation

## 2018-11-16 NOTE — Anesthesia Procedure Notes (Signed)
Anesthesia Regional Block: Adductor canal block   Pre-Anesthetic Checklist: ,, timeout performed, Correct Patient, Correct Site, Correct Laterality, Correct Procedure, Correct Position, site marked, Risks and benefits discussed,  Surgical consent,  Pre-op evaluation,  At surgeon's request and post-op pain management  Laterality: Right  Prep: Maximum Sterile Barrier Precautions used, chloraprep       Needles:  Injection technique: Single-shot  Needle Type: Echogenic Stimulator Needle     Needle Length: 9cm  Needle Gauge: 22     Additional Needles:   Procedures:,,,, ultrasound used (permanent image in chart),,,,  Narrative:  Start time: 11/16/2018 8:50 AM End time: 11/16/2018 9:00 AM Injection made incrementally with aspirations every 5 mL.  Performed by: Personally  Anesthesiologist: Freddrick March, MD  Additional Notes: Monitors applied. No increased pain on injection. No increased resistance to injection. Injection made in 5cc increments. Good needle visualization. Patient tolerated procedure well.

## 2018-11-16 NOTE — Progress Notes (Signed)
Pharmacy Antibiotic Note  Joseph [name] is a 73 y.o. male admitted on 11/13/2018 with sepsis and R foot gangrene (diabetic foot infection) and osteomyelitis, per 6/17 MRI report. Pharmacy has been consulted for vancomycin dosing (he is also on rocephin_  He is s/p R transmetatarsal amputation on 6/19. Per Dr. Sharol Given- continue antibiotics for 24 hours post-op -WBC= 27, afebrile, SCr= 1.32 (trend down)  Plan: -Continue vancomycin 1250mg  IV q24h -Will follow renal function, cultures and clinical progress -Consider discontinuing antibiotics after last dose on 6/20    Height: 6\' 4"  (193 cm) Weight: 205 lb 0.4 oz (93 kg) IBW/kg (Calculated) : 86.8  Temp (24hrs), Avg:98.1 F (36.7 C), Min:97.3 F (36.3 C), Max:98.8 F (37.1 C)  Recent Labs  Lab 11/13/18 1832 11/14/18 0006 11/14/18 0236 11/14/18 1141 11/14/18 1923 11/15/18 0458 11/15/18 1117 11/16/18 0145  WBC 44.9* 35.3*  --  36.6*  --   --  30.5* 27.0*  CREATININE 3.15* 2.35*  --   --  1.60* 1.38*  --  1.32*  LATICACIDVEN 4.0* 6.6* 0.4*  --   --   --   --   --   VANCORANDOM  --   --   --  13  --   --   --   --     Estimated Creatinine Clearance: 63 mL/min (A) (by C-G formula based on SCr of 1.32 mg/dL (H)).    No Known Allergies  Antimicrobials this admission: Ceftriaxone 2 gm IV Q 24 hrs 6/16>> Vanc 6/16 >>  Microbiology: 6/16 COVID: negative 6/16 bld cx: NGTD  Thank you for allowing pharmacy to be a part of this patient's care.  Hildred Laser, PharmD Clinical Pharmacist **Pharmacist phone directory can now be found on [email or web address] ([email or web address]).  Listed under [place].

## 2018-11-16 NOTE — Progress Notes (Signed)
PROGRESS NOTE    Joseph [place] Opperman  BDZ:[id number] DOB: 1946/03/31 DOA: 11/13/2018 PCP: Patient, No Pcp Per    Brief Narrative: Joseph [name] is a 73 y.o. male with medical history significant for type 2 diabetes, hypertension, CKD stage II who presents to the ED for evaluation of right foot wound that has been ongoing for the last 2 months.  Patient first noticed a wound to his right foot about 2 months ago.  He noticed a small laceration to his right dorsal foot after doing yard work.  He was seen in the ED initially on 09/12/2018 and treated with a 5-day course of doxycycline for cellulitis and superficial ulcer.  He was seen again on 09/20/2018 in the ED and switched to oral clindamycin.  He was seen for follow-up wound check in the ED on 09/23/2018 at which time it was felt his wound was improving.  He was seen again on 10/08/2018 and the wound appeared to be healing with chronic necrotic appearance.  He was given a prescription for oral Augmentin.  He was seen by community health and wellness to establish care on 10/17/2018 during which time he felt his foot pain and wound was improving.  He was given oral Augmentin and referred to the wound care clinic.   Assessment & Plan:   Principal Problem:   Sepsis with acute renal failure (HCC) Active Problems:   DM (diabetes mellitus) ([place])   Hypertension associated with diabetes ([place])   Acute renal failure superimposed on stage 2 chronic kidney disease ([place])   Open wound of right foot   Gangrene of right foot ([employer])   Diabetic polyneuropathy associated with type 2 diabetes mellitus ([place])   PVD (peripheral vascular disease) ([place])   Critical lower limb ischemia   Atrial fibrillation ([place])  Sepsis secondary to right foot infection/ right foot gangrene Continue with broad-spectrum antibiotics. Orthopedics consulted and recommendations given.  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 Appreciate vascular surgery consult. Outpatient follow up with  vascular in 3 months.  Follow blood cultures.  ABIs independently reviewed.  Afebrile and leukocytosis is improving.     Acute on stage II CKD with metabolic acidosis Improving with fluid resuscitation. Hold nephrotoxins. Avoid NSAIDS  Peripheral vascular disease: Appreciate vascular input,  Will start hin on aspirin and statin on discharge.   Type 2 DM CBG (last 3)  Recent Labs    11/15/18 2118 11/16/18 0629 11/16/18 0952  GLUCAP 201* 157* 164*   Resume SSI.    Anemia of blood loss vs anemia of chronic disease:  S/p 1 UNIT  Of prbc transfusion.  Repeat H&H is around 9.  GI consulted to see if EGD / colonoscopy can be done. Patient is refusing at this time.  Anemia panel reviewed.   New onset atrial fibrillation with RVR.  Rate better controlled, with amiodarone.   Echocardiogram ordered.  tsh wnl. Serial troponin's.  Pt remains asymptomatic, no chest pain or sob.cardiology consulted and recommendations given.    Hypertension: Well controlled.   Lactic acidosis  Resolved.    DVT prophylaxis:scd's Code Status: full code.  Family Communication: none at bedside. Discussed with the pts ex wife as per the patient's request.  Disposition Plan: pending clinical improvement and further management .   Consultants:   Gastroenterology.   Orthopedics.   vascular surgery.   Procedures: none.  Antimicrobials: rocephin    Subjective: No chest pain or sob, no nausea, vomiting.   Objective: Vitals:   11/16/18 1005 11/16/18 1015  11/16/18 1020 11/16/18 1041  BP: 125/68  106/87 139/71  Pulse: 72 70 70 70  Resp: 12 12 16    Temp:   (!) 97.4 F (36.3 C)   TempSrc:      SpO2: 100% 99% 100% 100%  Weight:      Height:        Intake/Output Summary (Last 24 hours) at 11/16/2018 1231 Last data filed at 11/16/2018 0935 Gross per 24 hour  Intake 1829.58 ml  Output 740 ml  Net 1089.58 ml   Filed Weights   11/13/18 2357 11/15/18 0528 11/16/18 0806  Weight:  108.9 kg 93.4 kg 93 kg    Examination:  General exam: Appears calm and comfortable  Respiratory system: Clear to auscultation. Respiratory effort normal. Cardiovascular system: S1 & S2 heard, irregular.  Gastrointestinal system: Abdomen is nondistended, soft and nontender. No organomegaly or masses felt. Normal bowel sounds heard. Central nervous system: Alert and oriented. No focal neurological deficits. Extremities: right lower extremity wound bandaged.  Skin: see above.  Psychiatry:  Mood & affect appropriate.     Data Reviewed: I have personally reviewed following labs and imaging studies  CBC: Recent Labs  Lab 11/13/18 1832 11/14/18 0006 11/14/18 1141 11/15/18 1117 11/16/18 0145  WBC 44.9* 35.3* 36.6* 30.5* 27.0*  NEUTROABS 39.5*  --   --   --   --   HGB 9.7* 6.7* 9.5* 9.3* 8.7*  HCT 29.2* 19.7* 28.6* 28.2* 26.5*  MCV 91.5 90.4 90.5 89.2 91.7  PLT 356 251 321 366 362   Basic Metabolic Panel: Recent Labs  Lab 11/13/18 1832 11/14/18 0006 11/14/18 1923 11/15/18 0458 11/15/18 1411 11/16/18 0145  NA 130* 135  --  142  --  140  K 4.1 4.0  --  4.1  --  3.7  CL 97* 107  --  112*  --  109  CO2 14* 17*  --  20*  --  19*  GLUCOSE 271* 225*  --  206*  --  251*  BUN 76* 68*  --  40*  --  31*  CREATININE 3.15* 2.35* 1.60* 1.38*  --  1.32*  CALCIUM 9.0 8.0*  --  8.7*  --  8.7*  MG  --   --   --   --  2.0  --    GFR: Estimated Creatinine Clearance: 63 mL/min (A) (by C-G formula based on SCr of 1.32 mg/dL (H)). Liver Function Tests: Recent Labs  Lab 11/13/18 1832 11/15/18 0458  AST 32 21  ALT 22 20  ALKPHOS 78 67  BILITOT 0.6 0.5  PROT 7.7 6.9  ALBUMIN 2.6* 2.2*   No results for input(s): LIPASE, AMYLASE in the last 168 hours. No results for input(s): AMMONIA in the last 168 hours. Coagulation Profile: No results for input(s): INR, PROTIME in the last 168 hours. Cardiac Enzymes: Recent Labs  Lab 11/15/18 1411 11/15/18 1930 11/16/18 0145  TROPONINI 0.03*  0.03* 0.03*   BNP (last 3 results) No results for input(s): PROBNP in the last 8760 hours. HbA1C: No results for input(s): HGBA1C in the last 72 hours. CBG: Recent Labs  Lab 11/15/18 1138 11/15/18 1630 11/15/18 2118 11/16/18 0629 11/16/18 0952  GLUCAP 385* 118* 201* 157* 164*   Lipid Profile: Recent Labs    11/14/18 0006  CHOL 61  HDL 13*  LDLCALC 39  TRIG 44  CHOLHDL 4.7   Thyroid Function Tests: Recent Labs    11/15/18 1411  TSH 0.616   Anemia Panel: Recent Labs  11/14/18 1141  VITAMINB12 333  FOLATE 21.2  FERRITIN 554*  TIBC 183*  IRON 78  RETICCTPCT 1.2   Sepsis Labs: Recent Labs  Lab 11/13/18 1832 11/14/18 0006 11/14/18 0236  LATICACIDVEN 4.0* 6.6* 0.4*    Recent Results (from the past 240 hour(s))  Blood Culture (routine x 2)     Status: None (Preliminary result)   Collection Time: 11/13/18  6:33 PM   Specimen: BLOOD  Result Value Ref Range Status   Specimen Description BLOOD RIGHT ANTECUBITAL  Final   Special Requests   Final    BOTTLES DRAWN AEROBIC AND ANAEROBIC Blood Culture adequate volume   Culture   Final    NO GROWTH 3 DAYS Performed at [facility], [address] [place], [place] [address]    Report Status PENDING  Incomplete  SARS Coronavirus 2     Status: None   Collection Time: 11/13/18  6:37 PM  Result Value Ref Range Status   SARS Coronavirus 2 NOT DETECTED NOT DETECTED Final    Comment: (NOTE) SARS-CoV-2 target nucleic acids are NOT DETECTED. The SARS-CoV-2 RNA is generally detectable in upper and lower respiratory specimens during the acute phase of infection.  Negative  results do not preclude SARS-CoV-2 infection, do not rule out co-infections with other pathogens, and should not be used as the sole basis for treatment or other patient management decisions.  Negative results must be combined with clinical observations, patient history, and epidemiological information. The expected result is Not Detected.  Fact Sheet for Patients: http://www.biofiredefense.com/wp-content/uploads/2020/03/BIOFIRE-COVID -19-patients.pdf Fact Sheet for Healthcare Providers: http://www.biofiredefense.com/wp-content/uploads/2020/03/BIOFIRE-COVID -19-hcp.pdf This test is not yet approved or cleared by the [place] FDA and  has been authorized for detection and/or diagnosis of SARS-CoV-2 by FDA under an Emergency Use Authorization (EUA).  This EUA will remain in effec t (meaning this test can be used) for the duration of  the COVID-19 declaration under Section 564(b)(1) of the Act, 21 U.S.C. section 360bbb-3(b)(1), unless the authorization is terminated or revoked sooner. Performed at [facility], [address] [place], [place] [address]   Blood Culture (routine x 2)     Status: None (Preliminary result)   Collection Time: 11/13/18  6:48 PM   Specimen: BLOOD LEFT ARM  Result Value Ref Range Status   Specimen Description BLOOD LEFT ARM  Final   Special Requests   Final    BOTTLES DRAWN AEROBIC AND ANAEROBIC Blood Culture adequate volume   Culture   Final    NO GROWTH 3 DAYS Performed at [facility], [address] [place], [place] [address]    Report Status PENDING  Incomplete  Surgical PCR screen     Status: None   Collection Time: 11/15/18 10:00 PM   Specimen: Nasal Mucosa; Nasal Swab  Result Value Ref Range Status   MRSA, PCR NEGATIVE NEGATIVE Final   Staphylococcus aureus NEGATIVE NEGATIVE Final    Comment: (NOTE) The Xpert SA Assay (FDA approved for NASAL specimens in patients 71 years of age and older), is one component of a comprehensive surveillance program. It is not intended to diagnose infection nor to guide or monitor treatment. Performed at [facility], [address] [place], [place] [address]          Radiology Studies: Vas Koreas Abi With/wo Tbi  Result Date: 11/15/2018 LOWER EXTREMITY DOPPLER STUDY Indications: Gangrene, and Dry gangrene right foot.  Diminishsed pulses  bilaterally. High Risk Factors: Diabetes.  Comparison Study: No comparison study available Performing Technologist: Milta DeitersSlaughter, [place] RVS  Examination Guidelines: A complete evaluation includes at minimum, Doppler waveform signals and systolic blood pressure reading at the level of bilateral brachial, anterior tibial, and posterior tibial arteries, when vessel segments are accessible. Bilateral testing is considered an integral part of a complete examination. Photoelectric Plethysmograph (PPG) waveforms and toe systolic pressure readings are included as required and additional duplex testing as needed. Limited examinations for reoccurring indications may be performed as noted.  ABI Findings: +---------+------------------+-----+-------------------+-----------------------+ Right    Rt Pressure (mmHg)IndexWaveform           Comment                 +---------+------------------+-----+-------------------+-----------------------+ Brachial 142                    triphasic                                  +---------+------------------+-----+-------------------+-----------------------+ PTA      116               0.82 dampened monophasic                        +---------+------------------+-----+-------------------+-----------------------+ DP       245               1.73 dampened monophasic                        +---------+------------------+-----+-------------------+-----------------------+ Great Toe                                          Unable to fully                                                            evaluate due to the                                                        size of the toe and the                                                    cuff                    +---------+------------------+-----+-------------------+-----------------------+ +---------+------------------+-----+-------------------+-------+ Left     Lt  Pressure (mmHg)IndexWaveform           Comment +---------+------------------+-----+-------------------+-------+ Brachial 140                    triphasic                  +---------+------------------+-----+-------------------+-------+ PTA      130  0.92 dampened monophasic        +---------+------------------+-----+-------------------+-------+ DP       151               1.06 dampened monophasic        +---------+------------------+-----+-------------------+-------+ Great Toe29                0.20                            +---------+------------------+-----+-------------------+-------+ +-------+-----------+----------------+------------+------------+ ABI/TBIToday's ABIToday's TBI     Previous ABIPrevious TBI +-------+-----------+----------------+------------+------------+ Right  1.73       Unable to obtain                         +-------+-----------+----------------+------------+------------+ Left   1.06       0.20                                     +-------+-----------+----------------+------------+------------+  Summary: Right - ABIs indicate non compressible arteries probably secondary to calcification. TBIs are unreliable - see comments in the chart listed above. Left - ABIs apperar normal however. ABIs are unreliable. Doppler waveforms and signals do not support the ABI value possible due to a falsely elevation of pressures. TBI is abnormal  *See table(s) above for measurements and observations.  Electronically signed by Lemar Livings MD on 11/15/2018 at 4:09:15 PM.    Final         Scheduled Meds: . sodium chloride   Intravenous Once  . acetaminophen  1,000 mg Oral Once  . docusate sodium  100 mg Oral BID  . fentaNYL      . gabapentin  300 mg Oral TID  . glipiZIDE  5 mg Oral QAC breakfast  . insulin aspart  0-15 Units Subcutaneous TID WC  . insulin aspart  0-5 Units Subcutaneous QHS  . insulin aspart  3 Units Subcutaneous TID WC  .  insulin detemir  8 Units Subcutaneous QHS  . midazolam      . oxyCODONE      . pantoprazole (PROTONIX) IV  40 mg Intravenous Q12H  . sodium chloride flush  3 mL Intravenous Q12H   Continuous Infusions: . sodium chloride    . amiodarone 30 mg/hr (11/16/18 0859)  . cefTRIAXone (ROCEPHIN)  IV 2 g (11/15/18 1719)  . methocarbamol (ROBAXIN) IV    . vancomycin 1,250 mg (11/16/18 0022)     LOS: 3 days    Time spent: 36 MINUTES.     Kathlen Mody, MD [facility] Pager [phone]   If 7PM-7AM, please contact night-coverage [email or web address] Password [facility] 11/16/2018, 12:31 PM

## 2018-11-16 NOTE — Op Note (Signed)
   Date of Surgery: 11/16/2018  INDICATIONS: Joseph [name] is a 73 y.o.-year-old male who presents with gangrene of the right foot.  There are no revascularization options and patient presents for transtibial amputation.Marland Kitchen  PREOPERATIVE DIAGNOSIS: Gangrene right foot  POSTOPERATIVE DIAGNOSIS: Same.  PROCEDURE: Transtibial amputation Application of Prevena wound VAC  SURGEON: Sharol Given, M.D.  ANESTHESIA:  general  IV FLUIDS AND URINE: See anesthesia.  ESTIMATED BLOOD LOSS: Minimal mL.  COMPLICATIONS: None.  DESCRIPTION OF PROCEDURE: The patient was brought to the operating room and underwent a general anesthetic. After adequate levels of anesthesia were obtained patient's lower extremity was prepped using DuraPrep draped into a sterile field. A timeout was called. The foot was draped out of the sterile field with impervious stockinette. A transverse incision was made 11 cm distal to the tibial tubercle. This curved proximally and a large posterior flap was created. The tibia was transected 1 cm proximal to the skin incision. The fibula was transected just proximal to the tibial incision. The tibia was beveled anteriorly. A large posterior flap was created. The sciatic nerve was pulled cut and allowed to retract. The vascular bundles were suture ligated with 2-0 silk. The deep and superficial fascial layers were closed using #1 Vicryl. The skin was closed using staples and 2-0 nylon. The wound was covered with a Prevena wound VAC. There was a good suction fit. A prosthetic shrinker was applied. Patient was extubated taken to the PACU in stable condition.   DISCHARGE PLANNING:  Antibiotic duration: Continue antibiotics for 24 hours  Weightbearing: Nonweightbearing on the right  Pain medication: Opioid pathway  Dressing care/ Wound VAC: Continue wound VAC for 1 week  Discharge to: Anticipate discharge to skilled nursing  Follow-up: In the office 1 week post operative.  Meridee Score, MD  [place] 9:49 AM

## 2018-11-16 NOTE — Anesthesia Postprocedure Evaluation (Signed)
Anesthesia Post Note  Patient: Joseph [name]  Procedure(s) Performed: RIGHT BELOW KNEE AMPUTATION (Right Leg Lower)     Patient location during evaluation: PACU Anesthesia Type: Regional and General Level of consciousness: awake and alert Pain management: pain level controlled Vital Signs Assessment: post-procedure vital signs reviewed and stable Respiratory status: spontaneous breathing, nonlabored ventilation, respiratory function stable and patient connected to nasal cannula oxygen Cardiovascular status: blood pressure returned to baseline and stable Postop Assessment: no apparent nausea or vomiting Anesthetic complications: no    Last Vitals:  Vitals:   11/16/18 1041 11/16/18 1242  BP: 139/71 102/62  Pulse: 70 62  Resp:    Temp:    SpO2: 100% 100%    Last Pain:  Vitals:   11/16/18 1007  TempSrc:   PainSc: 5                  Mazey Mantell L Capricia Serda

## 2018-11-16 NOTE — Plan of Care (Signed)
  Problem: Clinical Measurements: Goal: Respiratory complications will improve Outcome: Progressing Note: No s/s of respiratory complications noted. Goal: Cardiovascular complication will be avoided Outcome: Progressing Note: Amiodarone gtt infusing; NSR on telemetry.

## 2018-11-16 NOTE — Anesthesia Procedure Notes (Signed)
Anesthesia Regional Block: Popliteal block   Pre-Anesthetic Checklist: ,, timeout performed, Correct Patient, Correct Site, Correct Laterality, Correct Procedure, Correct Position, site marked, Risks and benefits discussed,  Surgical consent,  Pre-op evaluation,  At surgeon's request and post-op pain management  Laterality: Right  Prep: Maximum Sterile Barrier Precautions used, chloraprep       Needles:  Injection technique: Single-shot  Needle Type: Echogenic Stimulator Needle     Needle Length: 9cm  Needle Gauge: 22     Additional Needles:   Procedures:,,,, ultrasound used (permanent image in chart),,,,  Narrative:  Start time: 11/16/2018 8:39 AM End time: 11/16/2018 8:49 AM Injection made incrementally with aspirations every 5 mL.  Performed by: Personally  Anesthesiologist: Freddrick March, MD  Additional Notes: Monitors applied. No increased pain on injection. No increased resistance to injection. Injection made in 5cc increments. Good needle visualization. Patient tolerated procedure well.

## 2018-11-16 NOTE — Progress Notes (Signed)
Orthopedic Tech Progress Note Patient Details:  [name] Pates Aug 06, 1945 [id number]  Patient ID: [name] Kirkendoll, male   DOB: [birth year], 73 y.o.   MRN: [id number]   [place] Pink 11/16/2018, 5:44 PMCalled [facility] for right BKA stump shrinnker and stump protector.

## 2018-11-16 NOTE — Interval H&P Note (Signed)
History and Physical Interval Note:  11/16/2018 7:01 AM  Joseph [name]  has presented today for surgery, with the diagnosis of Gangrene Right Foot.  The various methods of treatment have been discussed with the patient and family. After consideration of risks, benefits and other options for treatment, the patient has consented to  Procedure(s): RIGHT BELOW KNEE AMPUTATION (Right) as a surgical intervention.  The patient's history has been reviewed, patient examined, no change in status, stable for surgery.  I have reviewed the patient's chart and labs.  Questions were answered to the patient's satisfaction.     Newt Minion

## 2018-11-17 ENCOUNTER — Encounter (HOSPITAL_COMMUNITY): Payer: Self-pay | Admitting: Orthopedic Surgery

## 2018-11-17 DIAGNOSIS — N170 Acute kidney failure with tubular necrosis: Secondary | ICD-10-CM

## 2018-11-17 DIAGNOSIS — N182 Chronic kidney disease, stage 2 (mild): Secondary | ICD-10-CM

## 2018-11-17 DIAGNOSIS — I4891 Unspecified atrial fibrillation: Secondary | ICD-10-CM

## 2018-11-17 DIAGNOSIS — L03115 Cellulitis of right lower limb: Secondary | ICD-10-CM

## 2018-11-17 LAB — BASIC METABOLIC PANEL
Anion gap: 12 (ref 5–15)
BUN: 17 mg/dL (ref 8–23)
CO2: 21 mmol/L — ABNORMAL LOW (ref 22–32)
Calcium: 8.4 mg/dL — ABNORMAL LOW (ref 8.9–10.3)
Chloride: 107 mmol/L (ref 98–111)
Creatinine, Ser: 1.28 mg/dL — ABNORMAL HIGH (ref 0.61–1.24)
GFR calc Af Amer: >60 mL/min (ref >60)
GFR calc non Af Amer: 56 mL/min — ABNORMAL LOW (ref >60)
Glucose, Bld: 62 mg/dL — ABNORMAL LOW (ref 70–99)
Potassium: 3.9 mmol/L (ref 3.5–5.1)
Sodium: 140 mmol/L (ref 135–145)

## 2018-11-17 LAB — GLUCOSE, CAPILLARY
Glucose-Capillary: 150 mg/dL — ABNORMAL HIGH (ref 70–99)
Glucose-Capillary: 128 mg/dL — ABNORMAL HIGH (ref 70–99)
Glucose-Capillary: 89 mg/dL (ref 70–99)
Glucose-Capillary: 74 mg/dL (ref 70–99)

## 2018-11-17 MED ORDER — PANTOPRAZOLE SODIUM 40 MG PO TBEC
40.0000 mg | DELAYED_RELEASE_TABLET | Freq: Two times a day (BID) | ORAL | Status: DC
  Administered 2018-11-17 – 2018-11-20 (×6): 40 mg via ORAL
  Filled 2018-11-17 (×6): qty 1

## 2018-11-17 MED ORDER — SODIUM CHLORIDE 0.9 % IV SOLN
2.0000 g | Freq: Once | INTRAVENOUS | Status: AC
  Administered 2018-11-17: 2 g via INTRAVENOUS
  Filled 2018-11-17: qty 2

## 2018-11-17 MED ORDER — FUROSEMIDE 10 MG/ML IJ SOLN
40.0000 mg | Freq: Once | INTRAMUSCULAR | Status: AC
  Administered 2018-11-17: 40 mg via INTRAVENOUS
  Filled 2018-11-17: qty 4

## 2018-11-17 MED ORDER — VANCOMYCIN HCL 10 G IV SOLR
1250.0000 mg | Freq: Once | INTRAVENOUS | Status: AC
  Administered 2018-11-17: 1250 mg via INTRAVENOUS
  Filled 2018-11-17: qty 1250

## 2018-11-17 NOTE — Progress Notes (Signed)
Patient ID: Joseph [name], male   DOB: 21-Dec-1945, 73 y.o.   MRN: [id number] Patient is postoperative day 1 right transtibial amputation.  There is no drainage in the wound VAC canister.  Patient states that he feels much better now that the gangrenous foot has been amputated.  Plan for [facility] for prosthetic fitting and stump shrinker.

## 2018-11-17 NOTE — Progress Notes (Signed)
PROGRESS NOTE    Joseph [name]  ZOX:[id number] DOB: 10/07/1945 DOA: 11/13/2018 PCP: Patient, No Pcp Per    Brief Narrative: Joseph [name] is a 73 y.o. male with medical history significant for type 2 diabetes, hypertension, CKD stage II who presents to the ED for evaluation of right foot wound that has been ongoing for the last 2 months.  Patient first noticed a wound to his right foot about 2 months ago.  He noticed a small laceration to his right dorsal foot after doing yard work.  He was seen in the ED initially on 09/12/2018 and treated with a 5-day course of doxycycline for cellulitis and superficial ulcer.  He was seen again on 09/20/2018 in the ED and switched to oral clindamycin.  He was seen for follow-up wound check in the ED on 09/23/2018 at which time it was felt his wound was improving.  He was seen again on 10/08/2018 and the wound appeared to be healing with chronic necrotic appearance.  He was given a prescription for oral Augmentin.  He was seen by community health and wellness to establish care on 10/17/2018 during which time he felt his foot pain and wound was improving.  He was given oral Augmentin and referred to the wound care clinic.   Assessment & Plan:   Principal Problem:   Sepsis with acute renal failure (HCC) Active Problems:   DM (diabetes mellitus) (HCC)   Hypertension associated with diabetes (HCC)   Acute renal failure superimposed on stage 2 chronic kidney disease (HCC)   Open wound of right foot   Gangrene of right foot (HCC)   Diabetic polyneuropathy associated with type 2 diabetes mellitus (HCC)   PVD (peripheral vascular disease) (HCC)   Critical lower limb ischemia   Atrial fibrillation (HCC)   Cellulitis of right lower extremity  Sepsis secondary to right foot infection/ right foot gangrene D/c antibiotics after today's dose. Orthopedics consulted and recommendations given.  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  Appreciate vascular surgery consult. Outpatient follow up with vascular in 3 months.  Follow blood cultures, so far negative. Marland Kitchen.  ABIs independently reviewed.  Afebrile and leukocytosis is improving.     Acute on stage II CKD with metabolic acidosis Improving with fluid resuscitation. Hold nephrotoxins. Avoid NSAIDS  Peripheral vascular disease: Appreciate vascular input,  Will start hin on aspirin and statin on discharge.   Type 2 DM CBG (last 3)  Recent Labs    11/16/18 2124 11/17/18 0733 11/17/18 1138  GLUCAP 109* 150* 128*   Resume SSI.    Anemia of blood loss superimposed on  anemia of chronic disease:  S/p 1 UNIT  Of prbc transfusion.  Repeat H&H is between 8 to 9.  Keep H&H greater than 7.  GI consulted to see if EGD / colonoscopy can be done. Patient is refusing at this time. Patient wants to schedule the EGD and colonoscopy as outpatient.  Anemia panel reviewed.   New onset atrial fibrillation with RVR.  Rate better controlled, with amiodarone.   Echocardiogram ordered  Showed preserved LVEF and pseudo normalization and  Moderate hypokinesis of the left ventricular, entire anteroseptal wall. tsh wnl. Serial troponin's flattened. .  Pt remains asymptomatic, no chest pain or sob.cardiology consulted and recommendations given.    Hypertension: Well controlled.   Lactic acidosis  Resolved.    DVT prophylaxis:scd's Code Status: full code.  Family Communication: none at bedside. Discussed with the pts ex wife  as per the patient's request.  Disposition Plan: pending physical therapy evaluation.   Consultants:   Gastroenterology.   Orthopedics.   vascular surgery.   Cardiology   Procedures: none.  Antimicrobials: rocephin and vancomycin    Subjective: No chest pain or sob, no nausea, vomiting.   Objective: Vitals:   11/16/18 1845 11/16/18 2125 11/17/18 0529 11/17/18 1141  BP: 101/62 99/60 122/71 138/75  Pulse: (!) 53 64 66 72   Resp:  16 16   Temp:  98.2 F (36.8 C) 98.8 F (37.1 C) 98.8 F (37.1 C)  TempSrc:  Oral Oral Oral  SpO2: 99% 100% 100% 99%  Weight:   93 kg   Height:        Intake/Output Summary (Last 24 hours) at 11/17/2018 1607 Last data filed at 11/17/2018 1100 Gross per 24 hour  Intake 240 ml  Output 1200 ml  Net -960 ml   Filed Weights   11/15/18 0528 11/16/18 0806 11/17/18 0529  Weight: 93.4 kg 93 kg 93 kg    Examination:  General exam: Appears calm and comfortable  Respiratory system: Clear to auscultation. Respiratory effort normal. Cardiovascular system: S1 & S2 heard, irregular.  Gastrointestinal system: Abdomen is nondistended, soft and nontender. No organomegaly or masses felt. Normal bowel sounds heard. Central nervous system: Alert and oriented. No focal neurological deficits. Extremities: right lower extremity wound bandaged.  Skin: see above.  Psychiatry:  Mood & affect appropriate.     Data Reviewed: I have personally reviewed following labs and imaging studies  CBC: Recent Labs  Lab 11/13/18 1832 11/14/18 0006 11/14/18 1141 11/15/18 1117 11/16/18 0145  WBC 44.9* 35.3* 36.6* 30.5* 27.0*  NEUTROABS 39.5*  --   --   --   --   HGB 9.7* 6.7* 9.5* 9.3* 8.7*  HCT 29.2* 19.7* 28.6* 28.2* 26.5*  MCV 91.5 90.4 90.5 89.2 91.7  PLT 356 251 321 366 362   Basic Metabolic Panel: Recent Labs  Lab 11/13/18 1832 11/14/18 0006 11/14/18 1923 11/15/18 0458 11/15/18 1411 11/16/18 0145  NA 130* 135  --  142  --  140  K 4.1 4.0  --  4.1  --  3.7  CL 97* 107  --  112*  --  109  CO2 14* 17*  --  20*  --  19*  GLUCOSE 271* 225*  --  206*  --  251*  BUN 76* 68*  --  40*  --  31*  CREATININE 3.15* 2.35* 1.60* 1.38*  --  1.32*  CALCIUM 9.0 8.0*  --  8.7*  --  8.7*  MG  --   --   --   --  2.0  --    GFR: Estimated Creatinine Clearance: 63 mL/min (A) (by C-G formula based on SCr of 1.32 mg/dL (H)). Liver Function Tests: Recent Labs  Lab 11/13/18 1832 11/15/18 0458   AST 32 21  ALT 22 20  ALKPHOS 78 67  BILITOT 0.6 0.5  PROT 7.7 6.9  ALBUMIN 2.6* 2.2*   No results for input(s): LIPASE, AMYLASE in the last 168 hours. No results for input(s): AMMONIA in the last 168 hours. Coagulation Profile: No results for input(s): INR, PROTIME in the last 168 hours. Cardiac Enzymes: Recent Labs  Lab 11/15/18 1411 11/15/18 1930 11/16/18 0145  TROPONINI 0.03* 0.03* 0.03*   BNP (last 3 results) No results for input(s): PROBNP in the last 8760 hours. HbA1C: No results for input(s): HGBA1C in the last 72 hours. CBG: Recent Labs  Lab 11/16/18 1310 11/16/18 1630 11/16/18 2124 11/17/18 0733 11/17/18 1138  GLUCAP 171* 161* 109* 150* 128*   Lipid Profile: No results for input(s): CHOL, HDL, LDLCALC, TRIG, CHOLHDL, LDLDIRECT in the last 72 hours. Thyroid Function Tests: Recent Labs    11/15/18 1411  TSH 0.616   Anemia Panel: No results for input(s): VITAMINB12, FOLATE, FERRITIN, TIBC, IRON, RETICCTPCT in the last 72 hours. Sepsis Labs: Recent Labs  Lab 11/13/18 1832 11/14/18 0006 11/14/18 0236  LATICACIDVEN 4.0* 6.6* 0.4*    Recent Results (from the past 240 hour(s))  Blood Culture (routine x 2)     Status: None (Preliminary result)   Collection Time: 11/13/18  6:33 PM   Specimen: BLOOD  Result Value Ref Range Status   Specimen Description BLOOD RIGHT ANTECUBITAL  Final   Special Requests   Final    BOTTLES DRAWN AEROBIC AND ANAEROBIC Blood Culture adequate volume   Culture   Final    NO GROWTH 4 DAYS Performed at [facility], [address] [place], [place] [address]    Report Status PENDING  Incomplete  SARS Coronavirus 2     Status: None   Collection Time: 11/13/18  6:37 PM  Result Value Ref Range Status   SARS Coronavirus 2 NOT DETECTED NOT DETECTED Final    Comment: (NOTE) SARS-CoV-2 target nucleic acids are NOT DETECTED. The SARS-CoV-2 RNA is generally detectable in upper and lower respiratory specimens during the  acute phase of infection.  Negative  results do not preclude SARS-CoV-2 infection, do not rule out co-infections with other pathogens, and should not be used as the sole basis for treatment or other patient management decisions.  Negative results must be combined with clinical observations, patient history, and epidemiological information. The expected result is Not Detected. Fact Sheet for Patients: http://www.biofiredefense.com/wp-content/uploads/2020/03/BIOFIRE-COVID -19-patients.pdf Fact Sheet for Healthcare Providers: http://www.biofiredefense.com/wp-content/uploads/2020/03/BIOFIRE-COVID -19-hcp.pdf This test is not yet approved or cleared by the [place] FDA and  has been authorized for detection and/or diagnosis of SARS-CoV-2 by FDA under an Emergency Use Authorization (EUA).  This EUA will remain in effec t (meaning this test can be used) for the duration of  the COVID-19 declaration under Section 564(b)(1) of the Act, 21 U.S.C. section 360bbb-3(b)(1), unless the authorization is terminated or revoked sooner. Performed at [facility], [address] [place], [place] [address]   Blood Culture (routine x 2)     Status: None (Preliminary result)   Collection Time: 11/13/18  6:48 PM   Specimen: BLOOD LEFT ARM  Result Value Ref Range Status   Specimen Description BLOOD LEFT ARM  Final   Special Requests   Final    BOTTLES DRAWN AEROBIC AND ANAEROBIC Blood Culture adequate volume   Culture   Final    NO GROWTH 4 DAYS Performed at [facility], [address] [place], [place] [address]    Report Status PENDING  Incomplete  Surgical PCR screen     Status: None   Collection Time: 11/15/18 10:00 PM   Specimen: Nasal Mucosa; Nasal Swab  Result Value Ref Range Status   MRSA, PCR NEGATIVE NEGATIVE Final   Staphylococcus aureus NEGATIVE NEGATIVE Final    Comment: (NOTE) The Xpert SA Assay (FDA approved for NASAL specimens in patients 71 years of age and  older), is one component of a comprehensive surveillance program. It is not intended to diagnose infection nor to guide or monitor treatment. Performed at [facility], [address], [place], [place] [address]          Radiology Studies: No results found.      Scheduled Meds: . sodium chloride   Intravenous Once  . amiodarone  200 mg Oral BID  . docusate sodium  100 mg Oral BID  . gabapentin  300 mg Oral TID  . glipiZIDE  5 mg Oral QAC breakfast  . insulin aspart  0-15 Units Subcutaneous TID WC  . insulin aspart  0-5 Units Subcutaneous QHS  . insulin aspart  3 Units Subcutaneous TID WC  . insulin detemir  8 Units Subcutaneous QHS  . pantoprazole (PROTONIX) IV  40 mg Intravenous Q12H  . sodium chloride flush  3 mL Intravenous Q12H   Continuous Infusions: . sodium chloride 10 mL/hr at 11/16/18 1739  . cefTRIAXone (ROCEPHIN)  IV    . methocarbamol (ROBAXIN) IV    . vancomycin       LOS: 4 days    Time spent: 36 MINUTES.     Hosie Poisson, MD [facility] Pager [phone]   If 7PM-7AM, please contact night-coverage [email or web address] Password [email or web address] 11/17/2018, 4:07 PM

## 2018-11-17 NOTE — Evaluation (Signed)
Physical Therapy Evaluation Patient Details Name: Joseph [name] MRN: [id number] DOB: Jan 20, 1946 Today's Date: 11/17/2018   History of Present Illness  73 yo male with onset of acute renal failure and sepsis from R foot laceration in his yard, that had become a cellulitis with an ulcer.  Pt received ABT from return to ED, with mult re-assessments that eventually led to BK amputation.  PMHx:  Sepsis, acute renal failure, DM, gangrene, PN, PVD, HTN, CKD, RLE ischemia,  a-fib,     Clinical Impression  Pt was seen for initial mobility and was mainly fearful of trusting LLE to support him in standing.  After being successful scooting on side of bed was willing to stand on RW but could not navigate a step.  Pt is very motivated, and with his recall of instructions for positioning splint on RLE, was demonstrating a great return on instruction for both PT and MD.  Will focus on strength and balance with transfers to move, and work on gait as tolerated by RLE.      Follow Up Recommendations CIR    Equipment Recommendations  Rolling walker with 5" wheels    Recommendations for Other Services Rehab consult     Precautions / Restrictions Precautions Precautions: Fall Precaution Comments: wear R leg brace at all times Required Braces or Orthoses: Other Brace(R stump splint) Restrictions Weight Bearing Restrictions: Yes RLE Weight Bearing: Non weight bearing      Mobility  Bed Mobility Overal bed mobility: Needs Assistance Bed Mobility: Supine to Sit;Sit to Supine     Supine to sit: Mod assist Sit to supine: Mod assist   General bed mobility comments: help to move legs and to promote better control of trunk  Transfers Overall transfer level: Needs assistance Equipment used: Rolling walker (2 wheeled);1 person hand held assist Transfers: Sit to/from Stand Sit to Stand: Max assist;From elevated surface         General transfer comment: used bed rail and dense cues for promotion of  standing control  Ambulation/Gait             General Gait Details: unable  Stairs            Wheelchair Mobility    Modified Rankin (Stroke Patients Only)       Balance Overall balance assessment: Needs assistance Sitting-balance support: Single extremity supported;Bilateral upper extremity supported Sitting balance-Leahy Scale: Poor     Standing balance support: Bilateral upper extremity supported;During functional activity Standing balance-Leahy Scale: Poor                               Pertinent Vitals/Pain Pain Assessment: Faces Faces Pain Scale: Hurts even more Pain Location: R stump Pain Descriptors / Indicators: Operative site guarding Pain Intervention(s): Limited activity within patient's tolerance;Monitored during session;Premedicated before session;Repositioned    Home Living Family/patient expects to be discharged to:: Private residence Living Arrangements: Alone Available Help at Discharge: Family;Available PRN/intermittently;Friend(s) Type of Home: House Home Access: Stairs to enter Entrance Stairs-Rails: Doctor, general practiceight;Left Entrance Stairs-Number of Steps: 5+1+1 Home Layout: Other (Comment)(split level with a single step in 2 places in home) Home Equipment: Cane - single point Additional Comments: was out driving and independent with no AD prior to this    Prior Function Level of Independence: Independent               Hand Dominance   Dominant Hand: Right    Extremity/Trunk Assessment   Upper  Extremity Assessment Upper Extremity Assessment: Overall WFL for tasks assessed    Lower Extremity Assessment Lower Extremity Assessment: RLE deficits/detail RLE Deficits / Details: new BK amputation  RLE: Unable to fully assess due to pain RLE Coordination: decreased gross motor    Cervical / Trunk Assessment Cervical / Trunk Assessment: Normal;Other exceptions(trunk mm's are weak as he drifts backward in sitting)   Communication   Communication: No difficulties  Cognition Arousal/Alertness: Awake/alert Behavior During Therapy: WFL for tasks assessed/performed Overall Cognitive Status: Within Functional Limits for tasks assessed                                        General Comments General comments (skin integrity, edema, etc.): has discomfort of splint on RLE in sitting with brace on so loosened it to allow comfort, resecured to stand    Exercises     Assessment/Plan    PT Assessment Patient needs continued PT services  PT Problem List Decreased strength;Decreased range of motion;Decreased activity tolerance;Decreased balance;Decreased mobility;Decreased coordination;Decreased knowledge of use of DME;Decreased safety awareness;Cardiopulmonary status limiting activity;Decreased skin integrity;Pain       PT Treatment Interventions DME instruction;Gait training;Stair training;Therapeutic activities;Functional mobility training;Therapeutic exercise;Balance training;Neuromuscular re-education;Patient/family education    PT Goals (Current goals can be found in the Care Plan section)  Acute Rehab PT Goals Patient Stated Goal: to get stronger and to get back to living independently PT Goal Formulation: With patient Time For Goal Achievement: 12/01/18 Potential to Achieve Goals: Good    Frequency Min 4X/week   Barriers to discharge Inaccessible home environment;Decreased caregiver support home alone with stairs to enter house    Co-evaluation               AM-PAC PT "6 Clicks" Mobility  Outcome Measure Help needed turning from your back to your side while in a flat bed without using bedrails?: None Help needed moving from lying on your back to sitting on the side of a flat bed without using bedrails?: A Lot Help needed moving to and from a bed to a chair (including a wheelchair)?: A Lot Help needed standing up from a chair using your arms (e.g., wheelchair or bedside  chair)?: A Lot Help needed to walk in hospital room?: Total Help needed climbing 3-5 steps with a railing? : Total 6 Click Score: 12    End of Session Equipment Utilized During Treatment: Gait belt Activity Tolerance: Patient tolerated treatment well;Patient limited by pain Patient left: in bed;with call bell/phone within reach;with bed alarm set Nurse Communication: Mobility status PT Visit Diagnosis: Unsteadiness on feet (R26.81);Muscle weakness (generalized) (M62.81);Difficulty in walking, not elsewhere classified (R26.2);Pain Pain - Right/Left: Right Pain - part of body: Leg    Time: 2671-2458 PT Time Calculation (min) (ACUTE ONLY): 45 min   Charges:   PT Evaluation $PT Eval Moderate Complexity: 1 Mod PT Treatments $Therapeutic Exercise: 8-22 mins $Therapeutic Activity: 8-22 mins        Ramond Dial 11/17/2018, 6:03 PM  Mee Hives, PT MS Acute Rehab Dept. Number: [place]

## 2018-11-17 NOTE — Progress Notes (Signed)
Progress Note  Patient Name: Joseph [name] Date of Encounter: 11/17/2018  Primary Cardiologist: Dietrich PatesPaula Ross, MD - new  Subjective   Pt denies chest pain, shortness of breath or palpitations. He feels better since his amputation, only mild soreness due to surgery.   Inpatient Medications    Scheduled Meds: . sodium chloride   Intravenous Once  . amiodarone  200 mg Oral BID  . docusate sodium  100 mg Oral BID  . gabapentin  300 mg Oral TID  . glipiZIDE  5 mg Oral QAC breakfast  . insulin aspart  0-15 Units Subcutaneous TID WC  . insulin aspart  0-5 Units Subcutaneous QHS  . insulin aspart  3 Units Subcutaneous TID WC  . insulin detemir  8 Units Subcutaneous QHS  . pantoprazole (PROTONIX) IV  40 mg Intravenous Q12H  . sodium chloride flush  3 mL Intravenous Q12H   Continuous Infusions: . sodium chloride 10 mL/hr at 11/16/18 1739  . cefTRIAXone (ROCEPHIN)  IV 2 g (11/15/18 1719)  . methocarbamol (ROBAXIN) IV    . vancomycin 1,250 mg (11/16/18 2308)   PRN Meds: acetaminophen, bisacodyl, HYDROmorphone (DILAUDID) injection, HYDROmorphone (DILAUDID) injection, magnesium citrate, methocarbamol **OR** methocarbamol (ROBAXIN) IV, metoCLOPramide **OR** metoCLOPramide (REGLAN) injection, ondansetron **OR** ondansetron (ZOFRAN) IV, oxyCODONE, oxyCODONE, polyethylene glycol   Vital Signs    Vitals:   11/16/18 1745 11/16/18 1845 11/16/18 2125 11/17/18 0529  BP: 99/75 101/62 99/60 122/71  Pulse: (!) 55 (!) 53 64 66  Resp:   16 16  Temp:   98.2 F (36.8 C) 98.8 F (37.1 C)  TempSrc:   Oral Oral  SpO2: 99% 99% 100% 100%  Weight:    93 kg  Height:        Intake/Output Summary (Last 24 hours) at 11/17/2018 1049 Last data filed at 11/17/2018 0900 Gross per 24 hour  Intake -  Output 1000 ml  Net -1000 ml   Last 3 Weights 11/17/2018 11/16/2018 11/15/2018  Weight (lbs) 205 lb 0.4 oz 205 lb 0.4 oz 205 lb 14.4 oz  Weight (kg) 93 kg 93 kg 93.396 kg      Telemetry    Sinus rhythm in  upper 50's- 60's - Personally Reviewed  ECG    No new tracings - Personally Reviewed  Physical Exam   GEN: No acute distress.   Neck: No JVD Cardiac: RRR, 2/6 systolic murmurs at LUSB  Respiratory: Clear to auscultation bilaterally. GI: Soft, nontender, non-distended  MS: Trace-1+ left lower leg edema; Right BKA Neuro:  Nonfocal  Psych: Normal affect   Labs    Chemistry Recent Labs  Lab 11/13/18 1832 11/14/18 0006 11/14/18 1923 11/15/18 0458 11/16/18 0145  NA 130* 135  --  142 140  K 4.1 4.0  --  4.1 3.7  CL 97* 107  --  112* 109  CO2 14* 17*  --  20* 19*  GLUCOSE 271* 225*  --  206* 251*  BUN 76* 68*  --  40* 31*  CREATININE 3.15* 2.35* 1.60* 1.38* 1.32*  CALCIUM 9.0 8.0*  --  8.7* 8.7*  PROT 7.7  --   --  6.9  --   ALBUMIN 2.6*  --   --  2.2*  --   AST 32  --   --  21  --   ALT 22  --   --  20  --   ALKPHOS 78  --   --  67  --   BILITOT 0.6  --   --  0.5  --   GFRNONAA 19* 27* 43* 51* 54*  GFRAA 22* 31* 49* 59* >60  ANIONGAP 19* 11  --  10 12     Hematology Recent Labs  Lab 11/14/18 1141 11/15/18 1117 11/16/18 0145  WBC 36.6* 30.5* 27.0*  RBC 3.16*  3.16* 3.16* 2.89*  HGB 9.5* 9.3* 8.7*  HCT 28.6* 28.2* 26.5*  MCV 90.5 89.2 91.7  MCH 30.1 29.4 30.1  MCHC 33.2 33.0 32.8  RDW 13.6 13.6 14.0  PLT 321 366 362    Cardiac Enzymes Recent Labs  Lab 11/15/18 1411 11/15/18 1930 11/16/18 0145  TROPONINI 0.03* 0.03* 0.03*   No results for input(s): TROPIPOC in the last 168 hours.   BNPNo results for input(s): BNP, PROBNP in the last 168 hours.   DDimer No results for input(s): DDIMER in the last 168 hours.   Radiology    No results found.  Cardiac Studies   Echocardiogram 11/16/18 IMPRESSIONS   1. Moderate hypokinesis of the left ventricular, entire anteroseptal wall.  2. The left ventricle has low normal systolic function, with an ejection fraction of 50-55%. The cavity size was normal. Left ventricular diastolic Doppler parameters are  consistent with pseudonormalization.  3. The right ventricle has normal systolic function. The cavity was normal. There is no increase in right ventricular wall thickness. Right ventricular systolic pressure is mildly elevated.  4. Left atrial size was mildly dilated.  5. The mitral valve is grossly normal. Mild thickening of the mitral valve leaflet. Mild calcification of the mitral valve leaflet. There is moderate mitral annular calcification present.  6. Tricuspid valve regurgitation is moderate-severe.  7. The aortic valve is tricuspid. Moderate thickening of the aortic valve. Moderate calcification of the aortic valve. Aortic valve regurgitation is trivial by color flow Doppler. Mild stenosis of the aortic valve.  8. RCC appears fixed but LCC/NCC open well.  9. The inferior vena cava was dilated in size with <50% respiratory variability.   Patient Profile     73 y.o. male with a hx of DM(A1c 5.7),HTN, CKD II, foot woundwho is being seen today for the evaluation of atrial fib in setting of gangrenous foot. He has no prior cardiac history.  Assessment & Plan    New onset atrial fibrillation -No prior cardiac history. -Possibly related to stressors of gangrenous foot.  -Did  Not respond to BB and diltiazem and BP was soft. Started on IV amiodarone and converted to sinus rhythm evening of 6/18. Switched to oral amiodarone 200 mg BID -CHADSVASC 4 for HTN, age, diabetes, vascular disease (PAD), but with severe anemia this admission and need for surgery, so anticoagulation not started. It is felt that pt may have a GI ulcer. Pt declined in patient GI evaluation.  -Echo showed low normal systolic function with EF 50-55% and diastolic dysfunction. There was also moderate hypokinesis of the anterior LV wall. He denies any chest pain or shortness of breath. Pt would benefit from ischemic testing. Would consider outpatient lexiscan myoview once he recovers from this surgery.  -Pt with mild LE  edema, lungs clear, no shortness of breath. He may benefit from a one time IV lasix dose.  -Would continue current amiodarone therapy, reduce to 200 mg daily after 2 weeks. Since this afib may be circumstantial, may be able to try stopping amiodarone at some point and monitor.  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  -Low sodium diet, avoid NSAIDs  Diabetes type 2 -Being seen by diabetes coordinator -Poorly controlled in the past. Most recent A2c 5.7 10/17/2018.   Anemia -Notes indicate that pt was taking a lot of BC powders and NSAIDs.  -Hgb was 9.7 on admission with drop to 6.7. Follow up was 9.5 after transfusion, 8.7 today.  -GI consulted and feel that pt would benefit form EGD and colonoscopy, likely as an out patient.  -supportive care with PPI, avoidance of NSAIDS and transfusion PRN.  Acute on chronic kidney disease stage II -SCr was 3.15 on admission, improving. SCr yesterday was 1.32.     For questions or updates, please contact [facility] Please consult [email or web address] for contact info under       Signed, Daune Perch, NP  11/17/2018, 10:49 AM

## 2018-11-18 LAB — BASIC METABOLIC PANEL
Anion gap: 10 (ref 5–15)
BUN: 15 mg/dL (ref 8–23)
CO2: 23 mmol/L (ref 22–32)
Calcium: 8.5 mg/dL — ABNORMAL LOW (ref 8.9–10.3)
Chloride: 102 mmol/L (ref 98–111)
Creatinine, Ser: 1.11 mg/dL (ref 0.61–1.24)
GFR calc Af Amer: >60 mL/min (ref >60)
GFR calc non Af Amer: >60 mL/min (ref >60)
Glucose, Bld: 160 mg/dL — ABNORMAL HIGH (ref 70–99)
Potassium: 3.9 mmol/L (ref 3.5–5.1)
Sodium: 135 mmol/L (ref 135–145)

## 2018-11-18 LAB — CBC
HCT: 27.2 % — ABNORMAL LOW (ref 39.0–52.0)
HCT: 26.7 % — ABNORMAL LOW (ref 39.0–52.0)
Hemoglobin: 9 g/dL — ABNORMAL LOW (ref 13.0–17.0)
Hemoglobin: 8.6 g/dL — ABNORMAL LOW (ref 13.0–17.0)
MCH: 30 pg (ref 26.0–34.0)
MCH: 29.7 pg (ref 26.0–34.0)
MCHC: 33.1 g/dL (ref 30.0–36.0)
MCHC: 32.2 g/dL (ref 30.0–36.0)
MCV: 90.7 fL (ref 80.0–100.0)
MCV: 92.1 fL (ref 80.0–100.0)
Platelets: 68 10*3/uL — ABNORMAL LOW (ref 150–400)
Platelets: ADEQUATE 10*3/uL (ref 150–400)
RBC: 3 MIL/uL — ABNORMAL LOW (ref 4.22–5.81)
RBC: 2.9 MIL/uL — ABNORMAL LOW (ref 4.22–5.81)
RDW: 13.8 % (ref 11.5–15.5)
RDW: 13.6 % (ref 11.5–15.5)
WBC: 12.2 10*3/uL — ABNORMAL HIGH (ref 4.0–10.5)
WBC: 19.3 10*3/uL — ABNORMAL HIGH (ref 4.0–10.5)
nRBC: 0 % (ref 0.0–0.2)
nRBC: 0 % (ref 0.0–0.2)

## 2018-11-18 LAB — CULTURE, BLOOD (ROUTINE X 2)
Culture: NO GROWTH
Culture: NO GROWTH
Special Requests: ADEQUATE
Special Requests: ADEQUATE

## 2018-11-18 LAB — GLUCOSE, CAPILLARY
Glucose-Capillary: 114 mg/dL — ABNORMAL HIGH (ref 70–99)
Glucose-Capillary: 146 mg/dL — ABNORMAL HIGH (ref 70–99)
Glucose-Capillary: 72 mg/dL (ref 70–99)
Glucose-Capillary: 143 mg/dL — ABNORMAL HIGH (ref 70–99)

## 2018-11-18 MED ORDER — FUROSEMIDE 10 MG/ML IJ SOLN
40.0000 mg | Freq: Once | INTRAMUSCULAR | Status: AC
  Administered 2018-11-18: 40 mg via INTRAVENOUS
  Filled 2018-11-18: qty 4

## 2018-11-18 NOTE — Progress Notes (Signed)
Progress Note  Patient Name: Joseph [name] Date of Encounter: 11/18/2018  Primary Cardiologist: Dorris Carnes, MD - new  Subjective   No issues overnight. Diuresed about 2L negative. Still with 1+ LLE edema. Creatinine improved from 1.32 to 1.28.   Inpatient Medications    Scheduled Meds: . sodium chloride   Intravenous Once  . amiodarone  200 mg Oral BID  . docusate sodium  100 mg Oral BID  . gabapentin  300 mg Oral TID  . glipiZIDE  5 mg Oral QAC breakfast  . insulin aspart  0-15 Units Subcutaneous TID WC  . insulin aspart  0-5 Units Subcutaneous QHS  . insulin aspart  3 Units Subcutaneous TID WC  . insulin detemir  8 Units Subcutaneous QHS  . pantoprazole  40 mg Oral BID  . sodium chloride flush  3 mL Intravenous Q12H   Continuous Infusions: . sodium chloride 10 mL/hr at 11/16/18 1739  . methocarbamol (ROBAXIN) IV     PRN Meds: acetaminophen, bisacodyl, HYDROmorphone (DILAUDID) injection, HYDROmorphone (DILAUDID) injection, magnesium citrate, methocarbamol **OR** methocarbamol (ROBAXIN) IV, metoCLOPramide **OR** metoCLOPramide (REGLAN) injection, ondansetron **OR** ondansetron (ZOFRAN) IV, oxyCODONE, oxyCODONE, polyethylene glycol   Vital Signs    Vitals:   11/17/18 1259 11/17/18 1752 11/17/18 2121 11/18/18 0509  BP:   140/70 (!) 138/95  Pulse: 73 94 74 78  Resp:   16 16  Temp:   99.4 F (37.4 C) 100 F (37.8 C)  TempSrc:   Oral Oral  SpO2: 100% 100% 100% 99%  Weight:    96.8 kg  Height:        Intake/Output Summary (Last 24 hours) at 11/18/2018 0953 Last data filed at 11/18/2018 7062 Gross per 24 hour  Intake 573.5 ml  Output 2550 ml  Net -1976.5 ml   Last 3 Weights 11/18/2018 11/17/2018 11/16/2018  Weight (lbs) 213 lb 6.5 oz 205 lb 0.4 oz 205 lb 0.4 oz  Weight (kg) 96.8 kg 93 kg 93 kg      Telemetry    Sinus rhythm in upper 50's- 60's - Personally Reviewed  ECG    No new tracings - Personally Reviewed  Physical Exam   GEN: No acute distress.    Neck: No JVD Cardiac: RRR, 2/6 systolic murmurs at LUSB  Respiratory: Clear to auscultation bilaterally. GI: Soft, nontender, non-distended  MS: 1+ left lower leg edema; Right BKA Neuro:  Nonfocal  Psych: Normal affect   Labs    Chemistry Recent Labs  Lab 11/13/18 1832  11/15/18 0458 11/16/18 0145 11/17/18 1646  NA 130*   < > 142 140 140  K 4.1   < > 4.1 3.7 3.9  CL 97*   < > 112* 109 107  CO2 14*   < > 20* 19* 21*  GLUCOSE 271*   < > 206* 251* 62*  BUN 76*   < > 40* 31* 17  CREATININE 3.15*   < > 1.38* 1.32* 1.28*  CALCIUM 9.0   < > 8.7* 8.7* 8.4*  PROT 7.7  --  6.9  --   --   ALBUMIN 2.6*  --  2.2*  --   --   AST 32  --  21  --   --   ALT 22  --  20  --   --   ALKPHOS 78  --  67  --   --   BILITOT 0.6  --  0.5  --   --   GFRNONAA 19*   < >  51* 54* 56*  GFRAA 22*   < > 59* >60 >60  ANIONGAP 19*   < > 10 12 12    < > = values in this interval not displayed.     Hematology Recent Labs  Lab 11/15/18 1117 11/16/18 0145 11/17/18 1646  WBC 30.5* 27.0* 12.2*  RBC 3.16* 2.89* 3.00*  HGB 9.3* 8.7* 9.0*  HCT 28.2* 26.5* 27.2*  MCV 89.2 91.7 90.7  MCH 29.4 30.1 30.0  MCHC 33.0 32.8 33.1  RDW 13.6 14.0 13.8  PLT 366 362 68*    Cardiac Enzymes Recent Labs  Lab 11/15/18 1411 11/15/18 1930 11/16/18 0145  TROPONINI 0.03* 0.03* 0.03*   No results for input(s): TROPIPOC in the last 168 hours.   BNPNo results for input(s): BNP, PROBNP in the last 168 hours.   DDimer No results for input(s): DDIMER in the last 168 hours.   Radiology    No results found.  Cardiac Studies   Echocardiogram 11/16/18 IMPRESSIONS   1. Moderate hypokinesis of the left ventricular, entire anteroseptal wall.  2. The left ventricle has low normal systolic function, with an ejection fraction of 50-55%. The cavity size was normal. Left ventricular diastolic Doppler parameters are consistent with pseudonormalization.  3. The right ventricle has normal systolic function. The cavity was  normal. There is no increase in right ventricular wall thickness. Right ventricular systolic pressure is mildly elevated.  4. Left atrial size was mildly dilated.  5. The mitral valve is grossly normal. Mild thickening of the mitral valve leaflet. Mild calcification of the mitral valve leaflet. There is moderate mitral annular calcification present.  6. Tricuspid valve regurgitation is moderate-severe.  7. The aortic valve is tricuspid. Moderate thickening of the aortic valve. Moderate calcification of the aortic valve. Aortic valve regurgitation is trivial by color flow Doppler. Mild stenosis of the aortic valve.  8. RCC appears fixed but LCC/NCC open well.  9. The inferior vena cava was dilated in size with <50% respiratory variability.   Patient Profile     73 y.o. male with a hx of DM(A1c 5.7),HTN, CKD II, foot woundwho is being seen today for the evaluation of atrial fib in setting of gangrenous foot. He has no prior cardiac history.  Assessment & Plan    New onset atrial fibrillation -No prior cardiac history. -Possibly related to stressors of gangrenous foot.  -Did  Not respond to BB and diltiazem and BP was soft. Started on IV amiodarone and converted to sinus rhythm evening of 6/18. Switched to oral amiodarone 200 mg BID -CHADSVASC 4 for HTN, age, diabetes, vascular disease (PAD), but with severe anemia this admission and need for surgery, so anticoagulation not started. It is felt that pt may have a GI ulcer. Pt declined in patient GI evaluation.  -Echo showed low normal systolic function with EF 50-55% and diastolic dysfunction. There was also moderate hypokinesis of the anterior LV wall. He denies any chest pain or shortness of breath. Pt would benefit from ischemic testing. Would consider outpatient lexiscan myoview once he recovers from this surgery.  -Give additional 40 mg IV lasix today. -Would continue current amiodarone therapy, reduce to 200 mg daily after 2 weeks.  Since this afib may be circumstantial, may be able to try stopping amiodarone at some point and monitor.  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  -Low sodium diet, avoid NSAIDs  Diabetes type 2 -Being seen by diabetes coordinator -Poorly  controlled in the past. Most recent A2c 5.7 10/17/2018.   Anemia -Notes indicate that pt was taking a lot of BC powders and NSAIDs.  -Hgb was 9.7 on admission with drop to 6.7. Follow up was 9.5 after transfusion, 8.7 today.  -GI consulted and feel that pt would benefit form EGD and colonoscopy, likely as an out patient.  -supportive care with PPI, avoidance of NSAIDS and transfusion PRN.  Acute on chronic kidney disease stage II -SCr was 3.15 on admission, improving. SCr yesterday was 1.32, now 1.28.     For questions or updates, please contact [facility] Please consult [email or web address] for contact info under   Chrystie NoseKenneth C. Durrell Barajas, MD, Milagros LollFACC, FACP  [place]  [facility]  Medical Director of the [facility] Diplomate of the American Board of Clinical Lipidology Attending Cardiologist  Direct Dial: [phone]  Fax: [phone]  Website:  [email or web address]  Chrystie NoseKenneth C Seaborn Nakama, MD  11/18/2018, 9:53 AM

## 2018-11-18 NOTE — Progress Notes (Signed)
PROGRESS NOTE    Joseph [name]  UJW:[id number] DOB: [birth year] DOA: 11/13/2018 PCP: Patient, No Pcp Per    Brief Narrative: Joseph Tyree is a 73 y.o. male with medical history significant for type 2 diabetes, hypertension, CKD stage II who presents to the ED for evaluation of right foot wound that has been ongoing for the last 2 months.  Patient first noticed a wound to his right foot about 2 months ago.  He noticed a small laceration to his right dorsal foot after doing yard work.  He was seen in the ED initially on 09/12/2018 and treated with a 5-day course of doxycycline for cellulitis and superficial ulcer.  He was seen again on 09/20/2018 in the ED and switched to oral clindamycin.  He was seen for follow-up wound check in the ED on 09/23/2018 at which time it was felt his wound was improving.  He was seen again on 10/08/2018 and the wound appeared to be healing with chronic necrotic appearance.  He was given a prescription for oral Augmentin.  He was seen by community health and wellness to establish care on 10/17/2018 during which time he felt his foot pain and wound was improving.  He was given oral Augmentin and referred to the wound care clinic.   Assessment & Plan:   Principal Problem:   Sepsis with acute renal failure (HCC) Active Problems:   DM (diabetes mellitus) (HCC)   Hypertension associated with diabetes (HCC)   Acute renal failure superimposed on stage 2 chronic kidney disease (HCC)   Open wound of right foot   Gangrene of right foot (HCC)   Diabetic polyneuropathy associated with type 2 diabetes mellitus (HCC)   PVD (peripheral vascular disease) (HCC)   Critical lower limb ischemia   Atrial fibrillation (HCC)   Cellulitis of right lower extremity  Sepsis secondary to right foot infection/ right foot gangrene D/c antibiotics after today's dose. Orthopedics consulted and recommendations given.  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  Appreciate vascular surgery consult. Outpatient follow up with vascular in 3 months.  Follow blood cultures, so far negative. Marland Kitchen.  ABIs independently reviewed.  Afebrile and leukocytosis is improving.     Acute on stage II CKD with metabolic acidosis Improved with fluid resuscitation. Hold nephrotoxins. Avoid NSAIDS  Peripheral vascular disease: Appreciate vascular input,  Will start hin on aspirin and statin on discharge.   Type 2 DM CBG (last 3)  Recent Labs    11/17/18 2123 11/18/18 0708 11/18/18 1204  GLUCAP 74 114* 146*   Resume SSI. Get A1c .    Anemia of blood loss superimposed on  anemia of chronic disease:  S/p 1 UNIT  Of prbc transfusion.  Repeat H&H is between 8 to 9.  Keep H&H greater than 7.  GI consulted to see if EGD / colonoscopy can be done. Patient is refusing at this time. Patient wants to schedule the EGD and colonoscopy as outpatient.  Anemia panel reviewed.   New onset atrial fibrillation with RVR.  Rate better controlled, with amiodarone.   Echocardiogram ordered  Showed preserved LVEF and pseudo normalization and  Moderate hypokinesis of the left ventricular, entire anteroseptal wall. tsh wnl. Serial troponin's flattened. .  Pt remains asymptomatic, no chest pain or sob.cardiology consulted and recommendations given.  Continue with amiodarone 200 mg BID and transition to 200mg  daily for 2 weeks .   Hypertension: Well controlled.   Lactic acidosis  Resolved.  DVT prophylaxis:scd's Code Status: full code.  Family Communication: none at bedside. Discussed with the pts ex wife as per the patient's request.  Disposition Plan: pending CIR evaluation.   Consultants:   Gastroenterology.   Orthopedics.   vascular surgery.   Cardiology   Procedures: none.  Antimicrobials: rocephin and vancomycin discontinued on 11/17/18   Subjective: No chest pain or sob, no nausea, vomiting.   Objective: Vitals:   11/17/18 1752 11/17/18  2121 11/18/18 0509 11/18/18 1206  BP:  140/70 (!) 138/95 127/72  Pulse: 94 74 78 75  Resp:  16 16 20   Temp:  99.4 F (37.4 C) 100 F (37.8 C) (!) 97.5 F (36.4 C)  TempSrc:  Oral Oral Oral  SpO2: 100% 100% 99% 99%  Weight:   96.8 kg   Height:        Intake/Output Summary (Last 24 hours) at 11/18/2018 1502 Last data filed at 11/18/2018 1000 Gross per 24 hour  Intake 573.5 ml  Output 1650 ml  Net -1076.5 ml   Filed Weights   11/16/18 0806 11/17/18 0529 11/18/18 0509  Weight: 93 kg 93 kg 96.8 kg    Examination:  General exam: Appears calm and comfortable  Respiratory system: Clear to auscultation. Respiratory effort normal.no wheezing or rhonchi Cardiovascular system: S1 & S2 heard, RRR,  Gastrointestinal system: Abdomen is nondistended, soft and nontender. No organomegaly or masses felt. Normal bowel sounds heard. Central nervous system: Alert and oriented. No focal neurological deficits. Extremities: right lower extremity wound bandaged. SHRINKER placed.  Skin: see above.  Psychiatry:  Mood & affect appropriate.     Data Reviewed: I have personally reviewed following labs and imaging studies  CBC: Recent Labs  Lab 11/13/18 1832  11/14/18 1141 11/15/18 1117 11/16/18 0145 11/17/18 1646 11/18/18 0859  WBC 44.9*   < > 36.6* 30.5* 27.0* 12.2* 19.3*  NEUTROABS 39.5*  --   --   --   --   --   --   HGB 9.7*   < > 9.5* 9.3* 8.7* 9.0* 8.6*  HCT 29.2*   < > 28.6* 28.2* 26.5* 27.2* 26.7*  MCV 91.5   < > 90.5 89.2 91.7 90.7 92.1  PLT 356   < > 321 366 362 68* PLATELET CLUMPS NOTED ON SMEAR, COUNT APPEARS ADEQUATE   < > = values in this interval not displayed.   Basic Metabolic Panel: Recent Labs  Lab 11/14/18 0006 11/14/18 1923 11/15/18 0458 11/15/18 1411 11/16/18 0145 11/17/18 1646 11/18/18 0859  NA 135  --  142  --  140 140 135  K 4.0  --  4.1  --  3.7 3.9 3.9  CL 107  --  112*  --  109 107 102  CO2 17*  --  20*  --  19* 21* 23  GLUCOSE 225*  --  206*  --   251* 62* 160*  BUN 68*  --  40*  --  31* 17 15  CREATININE 2.35* 1.60* 1.38*  --  1.32* 1.28* 1.11  CALCIUM 8.0*  --  8.7*  --  8.7* 8.4* 8.5*  MG  --   --   --  2.0  --   --   --    GFR: Estimated Creatinine Clearance: 74.9 mL/min (by C-G formula based on SCr of 1.11 mg/dL). Liver Function Tests: Recent Labs  Lab 11/13/18 1832 11/15/18 0458  AST 32 21  ALT 22 20  ALKPHOS 78 67  BILITOT 0.6 0.5  PROT 7.7  6.9  ALBUMIN 2.6* 2.2*   No results for input(s): LIPASE, AMYLASE in the last 168 hours. No results for input(s): AMMONIA in the last 168 hours. Coagulation Profile: No results for input(s): INR, PROTIME in the last 168 hours. Cardiac Enzymes: Recent Labs  Lab 11/15/18 1411 11/15/18 1930 11/16/18 0145  TROPONINI 0.03* 0.03* 0.03*   BNP (last 3 results) No results for input(s): PROBNP in the last 8760 hours. HbA1C: No results for input(s): HGBA1C in the last 72 hours. CBG: Recent Labs  Lab 11/17/18 1138 11/17/18 1614 11/17/18 2123 11/18/18 0708 11/18/18 1204  GLUCAP 128* 89 74 114* 146*   Lipid Profile: No results for input(s): CHOL, HDL, LDLCALC, TRIG, CHOLHDL, LDLDIRECT in the last 72 hours. Thyroid Function Tests: No results for input(s): TSH, T4TOTAL, FREET4, T3FREE, THYROIDAB in the last 72 hours. Anemia Panel: No results for input(s): VITAMINB12, FOLATE, FERRITIN, TIBC, IRON, RETICCTPCT in the last 72 hours. Sepsis Labs: Recent Labs  Lab 11/13/18 1832 11/14/18 0006 11/14/18 0236  LATICACIDVEN 4.0* 6.6* 0.4*    Recent Results (from the past 240 hour(s))  Blood Culture (routine x 2)     Status: None   Collection Time: 11/13/18  6:33 PM   Specimen: BLOOD  Result Value Ref Range Status   Specimen Description BLOOD RIGHT ANTECUBITAL  Final   Special Requests   Final    BOTTLES DRAWN AEROBIC AND ANAEROBIC Blood Culture adequate volume   Culture   Final    NO GROWTH 5 DAYS Performed at [facility], [address] [place], [place] [address]     Report Status 11/18/2018 FINAL  Final  SARS Coronavirus 2     Status: None   Collection Time: 11/13/18  6:37 PM  Result Value Ref Range Status   SARS Coronavirus 2 NOT DETECTED NOT DETECTED Final    Comment: (NOTE) SARS-CoV-2 target nucleic acids are NOT DETECTED. The SARS-CoV-2 RNA is generally detectable in upper and lower respiratory specimens during the acute phase of infection.  Negative  results do not preclude SARS-CoV-2 infection, do not rule out co-infections with other pathogens, and should not be used as the sole basis for treatment or other patient management decisions.  Negative results must be combined with clinical observations, patient history, and epidemiological information. The expected result is Not Detected. Fact Sheet for Patients: http://www.biofiredefense.com/wp-content/uploads/2020/03/BIOFIRE-COVID -19-patients.pdf Fact Sheet for Healthcare Providers: http://www.biofiredefense.com/wp-content/uploads/2020/03/BIOFIRE-COVID -19-hcp.pdf This test is not yet approved or cleared by the [place] FDA and  has been authorized for detection and/or diagnosis of SARS-CoV-2 by FDA under an Emergency Use Authorization (EUA).  This EUA will remain in effec t (meaning this test can be used) for the duration of  the COVID-19 declaration under Section 564(b)(1) of the Act, 21 U.S.C. section 360bbb-3(b)(1), unless the authorization is terminated or revoked sooner. Performed at [facility], [address] [place], [place] [address]   Blood Culture (routine x 2)     Status: None   Collection Time: 11/13/18  6:48 PM   Specimen: BLOOD LEFT ARM  Result Value Ref Range Status   Specimen Description BLOOD LEFT ARM  Final   Special Requests   Final    BOTTLES DRAWN AEROBIC AND ANAEROBIC Blood Culture adequate volume   Culture   Final    NO GROWTH 5 DAYS Performed at [facility], [address] [place], [place] [address]    Report Status 11/18/2018  FINAL  Final  Surgical PCR screen  Status: None   Collection Time: 11/15/18 10:00 PM   Specimen: Nasal Mucosa; Nasal Swab  Result Value Ref Range Status   MRSA, PCR NEGATIVE NEGATIVE Final   Staphylococcus aureus NEGATIVE NEGATIVE Final    Comment: (NOTE) The Xpert SA Assay (FDA approved for NASAL specimens in patients 71 years of age and older), is one component of a comprehensive surveillance program. It is not intended to diagnose infection nor to guide or monitor treatment. Performed at [facility], [address] [place], [place] [address]          Radiology Studies: No results found.      Scheduled Meds: . sodium chloride   Intravenous Once  . amiodarone  200 mg Oral BID  . docusate sodium  100 mg Oral BID  . gabapentin  300 mg Oral TID  . glipiZIDE  5 mg Oral QAC breakfast  . insulin aspart  0-15 Units Subcutaneous TID WC  . insulin aspart  0-5 Units Subcutaneous QHS  . insulin aspart  3 Units Subcutaneous TID WC  . insulin detemir  8 Units Subcutaneous QHS  . pantoprazole  40 mg Oral BID  . sodium chloride flush  3 mL Intravenous Q12H   Continuous Infusions: . sodium chloride 10 mL/hr at 11/16/18 1739  . methocarbamol (ROBAXIN) IV       LOS: 5 days    Time spent 30 MINUTES.     Kathlen ModyVijaya Deina Lipsey, MD [facility] Pager [phone]   If 7PM-7AM, please contact night-coverage [email or web address] Password [email or web address] 11/18/2018, 3:02 PM

## 2018-11-18 NOTE — Progress Notes (Signed)
Rehab Admissions Coordinator Note:  Per PT recommendation, this patient was screened by Jhonnie Garner for appropriateness for an Inpatient Acute Rehab Consult.  At this time, we are recommending an Inpatient Rehab consult. AC will contact MD for order.  Jhonnie Garner 11/18/2018, 11:05 AM  I can be reached at [phone].

## 2018-11-18 NOTE — Social Work (Signed)
CSW acknowledging consult for SNF placement. Will follow- aware currently pt recommended for CIR.   Westley Hummer, MSW, [place] Work [phone]

## 2018-11-18 NOTE — Plan of Care (Signed)

## 2018-11-19 DIAGNOSIS — I10 Essential (primary) hypertension: Secondary | ICD-10-CM

## 2018-11-19 DIAGNOSIS — E1159 Type 2 diabetes mellitus with other circulatory complications: Secondary | ICD-10-CM

## 2018-11-19 DIAGNOSIS — E1152 Type 2 diabetes mellitus with diabetic peripheral angiopathy with gangrene: Secondary | ICD-10-CM

## 2018-11-19 LAB — BASIC METABOLIC PANEL
Anion gap: 7 (ref 5–15)
BUN: 16 mg/dL (ref 8–23)
CO2: 27 mmol/L (ref 22–32)
Calcium: 8.3 mg/dL — ABNORMAL LOW (ref 8.9–10.3)
Chloride: 100 mmol/L (ref 98–111)
Creatinine, Ser: 1.21 mg/dL (ref 0.61–1.24)
GFR calc Af Amer: >60 mL/min (ref >60)
GFR calc non Af Amer: 60 mL/min — ABNORMAL LOW (ref >60)
Glucose, Bld: 115 mg/dL — ABNORMAL HIGH (ref 70–99)
Potassium: 3.9 mmol/L (ref 3.5–5.1)
Sodium: 134 mmol/L — ABNORMAL LOW (ref 135–145)

## 2018-11-19 LAB — CBC
HCT: 26.4 % — ABNORMAL LOW (ref 39.0–52.0)
Hemoglobin: 8.5 g/dL — ABNORMAL LOW (ref 13.0–17.0)
MCH: 29.7 pg (ref 26.0–34.0)
MCHC: 32.2 g/dL (ref 30.0–36.0)
MCV: 92.3 fL (ref 80.0–100.0)
Platelets: 373 10*3/uL (ref 150–400)
RBC: 2.86 MIL/uL — ABNORMAL LOW (ref 4.22–5.81)
RDW: 13.5 % (ref 11.5–15.5)
WBC: 16.2 10*3/uL — ABNORMAL HIGH (ref 4.0–10.5)
nRBC: 0 % (ref 0.0–0.2)

## 2018-11-19 LAB — GLUCOSE, CAPILLARY
Glucose-Capillary: 139 mg/dL — ABNORMAL HIGH (ref 70–99)
Glucose-Capillary: 85 mg/dL (ref 70–99)
Glucose-Capillary: 135 mg/dL — ABNORMAL HIGH (ref 70–99)

## 2018-11-19 LAB — HEMOGLOBIN A1C
Hgb A1c MFr Bld: 6.4 % — ABNORMAL HIGH (ref 4.8–5.6)
Mean Plasma Glucose: 137 mg/dL

## 2018-11-19 NOTE — Progress Notes (Addendum)
Progress Note  Patient Name: Joseph [name] Date of Encounter: 11/19/2018  Primary Cardiologist: Dorris Carnes, MD   Subjective   No significant overnight events. Patient has no complaints this morning. No chest pain, shortness of breath, palpitations, lightheadedness, or dizziness.   Inpatient Medications    Scheduled Meds: . sodium chloride   Intravenous Once  . amiodarone  200 mg Oral BID  . docusate sodium  100 mg Oral BID  . gabapentin  300 mg Oral TID  . glipiZIDE  5 mg Oral QAC breakfast  . insulin aspart  0-15 Units Subcutaneous TID WC  . insulin aspart  0-5 Units Subcutaneous QHS  . insulin aspart  3 Units Subcutaneous TID WC  . insulin detemir  8 Units Subcutaneous QHS  . pantoprazole  40 mg Oral BID  . sodium chloride flush  3 mL Intravenous Q12H   Continuous Infusions: . sodium chloride 10 mL/hr at 11/16/18 1739  . methocarbamol (ROBAXIN) IV     PRN Meds: acetaminophen, bisacodyl, HYDROmorphone (DILAUDID) injection, HYDROmorphone (DILAUDID) injection, magnesium citrate, methocarbamol **OR** methocarbamol (ROBAXIN) IV, metoCLOPramide **OR** metoCLOPramide (REGLAN) injection, ondansetron **OR** ondansetron (ZOFRAN) IV, oxyCODONE, oxyCODONE, polyethylene glycol   Vital Signs    Vitals:   11/18/18 0509 11/18/18 1206 11/18/18 2148 11/19/18 0530  BP: (!) 138/95 127/72 112/69 107/83  Pulse: 78 75 94 89  Resp: 16 20 16 16   Temp: 100 F (37.8 C) (!) 97.5 F (36.4 C) 98.8 F (37.1 C) 98.6 F (37 C)  TempSrc: Oral Oral Oral Oral  SpO2: 99% 99% 96% 99%  Weight: 96.8 kg   96.7 kg  Height:        Intake/Output Summary (Last 24 hours) at 11/19/2018 0907 Last data filed at 11/19/2018 0533 Gross per 24 hour  Intake 600 ml  Output 2000 ml  Net -1400 ml   Filed Weights   11/17/18 0529 11/18/18 0509 11/19/18 0530  Weight: 93 kg 96.8 kg 96.7 kg    Telemetry    Normal sinus rhythm with baseline rates in the 70's to 90's. A few brief episodes of tachycardia in the  120's - multiple clear P waves noted during these episodes so do not think this is true atrial fibrillation. - Personally Reviewed  ECG    No new ECG tracings today. - Personally Reviewed  Physical Exam   GEN: 73 year old  male resting comfortably. Alert and in no acute distress.   Neck: Supple.  Cardiac: RRR. Possible soft systolic murmur. No gallops or rubs.  Respiratory: Clear to auscultation bilaterally. No wheezes, rhonchi, or rales. GI: Abdomen soft, non-distended, and non-tender.  Extremities: Mild pitting edema of left lower extremity. S/p right below knee amputation.  Skin: Warm and dry. Neuro:  No focal deficits. Psych: Normal affect. Responds appropriately.  Labs    Chemistry Recent Labs  Lab 11/13/18 1832  11/15/18 0458  11/17/18 1646 11/18/18 0859 11/19/18 0457  NA 130*   < > 142   < > 140 135 134*  K 4.1   < > 4.1   < > 3.9 3.9 3.9  CL 97*   < > 112*   < > 107 102 100  CO2 14*   < > 20*   < > 21* 23 27  GLUCOSE 271*   < > 206*   < > 62* 160* 115*  BUN 76*   < > 40*   < > 17 15 16   CREATININE 3.15*   < > 1.38*   < >  1.28* 1.11 1.21  CALCIUM 9.0   < > 8.7*   < > 8.4* 8.5* 8.3*  PROT 7.7  --  6.9  --   --   --   --   ALBUMIN 2.6*  --  2.2*  --   --   --   --   AST 32  --  21  --   --   --   --   ALT 22  --  20  --   --   --   --   ALKPHOS 78  --  67  --   --   --   --   BILITOT 0.6  --  0.5  --   --   --   --   GFRNONAA 19*   < > 51*   < > 56* >60 60*  GFRAA 22*   < > 59*   < > >60 >60 >60  ANIONGAP 19*   < > 10   < > 12 10 7    < > = values in this interval not displayed.     Hematology Recent Labs  Lab 11/17/18 1646 11/18/18 0859 11/19/18 0457  WBC 12.2* 19.3* 16.2*  RBC 3.00* 2.90* 2.86*  HGB 9.0* 8.6* 8.5*  HCT 27.2* 26.7* 26.4*  MCV 90.7 92.1 92.3  MCH 30.0 29.7 29.7  MCHC 33.1 32.2 32.2  RDW 13.8 13.6 13.5  PLT 68* PLATELET CLUMPS NOTED ON SMEAR, COUNT APPEARS ADEQUATE 373    Cardiac Enzymes Recent Labs  Lab 11/15/18 1411 11/15/18  1930 11/16/18 0145  TROPONINI 0.03* 0.03* 0.03*   No results for input(s): TROPIPOC in the last 168 hours.   BNPNo results for input(s): BNP, PROBNP in the last 168 hours.   DDimer No results for input(s): DDIMER in the last 168 hours.   Radiology    No results found.  Cardiac Studies   Echocardiogram 11/16/2018: Impressions: 1. Moderate hypokinesis of the left ventricular, entire anteroseptal wall.  2. The left ventricle has low normal systolic function, with an ejection fraction of 50-55%. The cavity size was normal. Left ventricular diastolic Doppler parameters are consistent with pseudonormalization.  3. The right ventricle has normal systolic function. The cavity was normal. There is no increase in right ventricular wall thickness. Right ventricular systolic pressure is mildly elevated.  4. Left atrial size was mildly dilated.  5. The mitral valve is grossly normal. Mild thickening of the mitral valve leaflet. Mild calcification of the mitral valve leaflet. There is moderate mitral annular calcification present.  6. Tricuspid valve regurgitation is moderate-severe.  7. The aortic valve is tricuspid. Moderate thickening of the aortic valve. Moderate calcification of the aortic valve. Aortic valve regurgitation is trivial by color flow Doppler. Mild stenosis of the aortic valve.  8. RCC appears fixed but LCC/NCC open well.  9. The inferior vena cava was dilated in size with <50% respiratory variability.  Summary: Low normal LV EF. On parasternal views, the anteroseptal wall appears mildly hypokinetic, but on short axis views it is not impressive. 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  Patient Profile   Mr. Joseph [name] is a 73 y.o. male with a history of hypertension, diabetes mellitus, and CKD stage II who presented on 11/13/2018 for evaluation of a progressive right grangrenous foot wound for the last 2 months. 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 Cardiology was  consulted on 11/15/2018 for evaluation of new onset atrial firbillation  Assessment & Plan    New Onset Atrial Fibrillation  - Patient did not respond to beta-blocker or Diltiazem and BP was soft so IV Amiodarone was started and patient converted to sinus rhythm on the evening of 6/18. - Telemetry shows normal sinus rhythm with baseline rates in the 70's to 90's. A few brief episodes of tachycardia in the 120's - multiple clear P waves noted during these episodes so do not think this is true atrial fibrillation. - Echo this admission showed LVEF of 50-55% and diastolic dysfunction. There was also moderate hypokinesis of the anterior LV wall. - Continue PO Amiodarone 200mg  twice daily. Plan to reduce to 200mg  daily after 2 weeks. - CHA2DS2-VASc = at least 4 (HTN, DM, PAD, age). However, patient had severe anemia during this admission and it is felt that he may have a GI ulcer. Patient apparently declined an in-patient GI evaluation. Therefore, no anticoagulation has been started. Will defer to MD. - Per Dr. Rennis GoldenHilty, patient would benefit from ischemic testing. Consider outpatient Lexiscan once he recovers from surgery.   PAD - Patient s/p right below knee amputation on 11/16/2018. - Per primary team, plan is to start Aspirin and statin on discharge.  Hypertension - BP currently well controlled. Most recent BP 107/83.  Diabetes Mellitus - Most recent hemoglobin A1c of 5.7 in 09/2018. - Being seen by diabetes coordinator. - Management per primary team.  Anemia - Hemoglobin 9.7 on admission and dropped to 6.7 on 6/17. Patient received 1 unit of PRBCs. Hemoglobin has been in the 8.5 to 9.5 range since that time. Hemoglobin today 8.5.  - GI consulted and feel that patient would benefit form EGD and colonoscopy, likely as an outpatient.  - Continue supportive care with PPI, avoidance of NSAIDS, and transfusion PRN.   Acute on CKD Stage II - Serum creatinine 3.15 on admission. Improved with IV fluids. 1.21 today. - Continue to avoid Nephrotoxins. - Daily BMET.  For questions or updates, please contact [facility] Please consult [email or web address] for contact info under Cardiology/STEMI.      Signed, Corrin Parkerallie E Goodrich, PA-C  11/19/2018, 9:07 AM   Pager: [phone]  I have examined the patient and reviewed assessment and plan and discussed with patient.  Agree with above as stated.   Now in NSR. Needs GI status ascertained before starting anticoagulation.   This patients CHA2DS2-VASc Score and unadjusted Ischemic Stroke Rate (% per year) is equal to 4.8 % stroke rate/year from a score of 4  Above score calculated as 1 point each if present [CHF, HTN, DM, Vascular=MI/PAD/Aortic Plaque, Age if 65-74, or Male] Above score calculated as 2 points each if present [Age > 75, or Stroke/TIA/TE]    PAD as well.   Anemia, stable at this time.    Lance MussJayadeep Arda Daggs

## 2018-11-19 NOTE — Evaluation (Signed)
Occupational Therapy Evaluation Patient Details Name: Joseph [name] MRN: [id number] DOB: 21-Nov-1945 Today's Date: 11/19/2018    History of Present Illness 73 yo male with onset of acute renal failure and sepsis from R foot laceration in his yard, that had become a cellulitis with an ulcer.  Pt received ABT from return to ED, with mult re-assessments that eventually led to BK amputation.  PMHx:  Sepsis, acute renal failure, DM, gangrene, PN, PVD, HTN, CKD, RLE ischemia,  a-fib,    Clinical Impression   Pt admitted with above diagnoses, with pain, decreased activity tolerance and post BKA surgical barriers limiting ability to engage in BADL at desired level of ind. PTA pt ind without AD, driving, mowing lawn and remaining active. At time of evaluation he is mod A for bed mobility and max A for squat pivot to recliner t/fs. He is max A for LB ADLs and any standing dynamic ADLs due to balance compromises when standing. Instructed pt in chair push ups once in recliner to active UE musculature for improved t/f in the future. He will most benefit from CIR for intensive and consistent therapy to help return pt to ind PLOF. He is very motivated and states he is willing to engage in whatever therapy he needs. Will continue to follow acutely per POC listed below.    Follow Up Recommendations  CIR    Equipment Recommendations  Other (comment)(defer to next venue)    Recommendations for Other Services       Precautions / Restrictions Precautions Precautions: Fall Precaution Comments: wear R leg brace at all times Required Braces or Orthoses: Other Brace Other Brace: residual limb splint Restrictions Weight Bearing Restrictions: Yes RLE Weight Bearing: Non weight bearing      Mobility Bed Mobility Overal bed mobility: Needs Assistance Bed Mobility: Supine to Sit;Sit to Supine     Supine to sit: Mod assist     General bed mobility comments: BLE sequencing and trunk control, needed assist in  shifting hips square at EOB  Transfers Overall transfer level: Needs assistance Equipment used: Rolling walker (2 wheeled) Transfers: Sit to/from Stand Sit to Stand: Max assist;From elevated surface         General transfer comment: rasied bed, VC's needed for hand placement and posture    Balance Overall balance assessment: Needs assistance   Sitting balance-Leahy Scale: Fair     Standing balance support: Bilateral upper extremity supported;During functional activity Standing balance-Leahy Scale: Zero                             ADL either performed or assessed with clinical judgement   ADL Overall ADL's : Needs assistance/impaired Eating/Feeding: Independent;Sitting   Grooming: Set up;Sitting   Upper Body Bathing: Set up;Sitting   Lower Body Bathing: Maximal assistance;With adaptive equipment;Sitting/lateral leans;Sit to/from stand   Upper Body Dressing : Set up;Sitting   Lower Body Dressing: Maximal assistance;Sitting/lateral leans;Sit to/from stand;With adaptive equipment   Toilet Transfer: Maximal assistance;Squat-pivot;BSC;RW   Toileting- Clothing Manipulation and Hygiene: Minimal assistance;Sitting/lateral lean   Tub/ Shower Transfer: Maximal assistance;Shower seat;Squat-pivot;Ambulation   Functional mobility during ADLs: Maximal assistance;Rolling walker General ADL Comments: currently a max A for transfers due to unfamiliarity with DME and balance compromises     Vision   Vision Assessment?: No apparent visual deficits     Perception     Praxis      Pertinent Vitals/Pain Pain Assessment: Faces Faces Pain Scale: Hurts even  more Pain Location: R residual limb Pain Descriptors / Indicators: Operative site guarding;Aching;Sore Pain Intervention(s): Limited activity within patient's tolerance;Monitored during session;Premedicated before session;Repositioned     Hand Dominance Right   Extremity/Trunk Assessment Upper Extremity  Assessment Upper Extremity Assessment: Overall WFL for tasks assessed   Lower Extremity Assessment Lower Extremity Assessment: RLE deficits/detail RLE Deficits / Details: new BKA       Communication Communication Communication: No difficulties   Cognition Arousal/Alertness: Awake/alert Behavior During Therapy: WFL for tasks assessed/performed Overall Cognitive Status: Within Functional Limits for tasks assessed                                 General Comments: occasionally tangential   General Comments       Exercises     Shoulder Instructions      Home Living Family/patient expects to be discharged to:: Private residence Living Arrangements: Alone Available Help at Discharge: Family;Available PRN/intermittently;Friend(s) Type of Home: House Home Access: Stairs to enter [employer] of Steps: 5+1+1 Entrance Stairs-Rails: Right;Left Home Layout: Other (Comment)(split level home)         Bathroom Toilet: Standard     Home Equipment: Cane - single point          Prior Functioning/Environment Level of Independence: Independent        Comments: driving, mowing his lawn, very active prior        OT Problem List: Decreased strength;Decreased knowledge of use of DME or AE;Decreased activity tolerance;Impaired balance (sitting and/or standing);Pain      OT Treatment/Interventions: Self-care/ADL training;Therapeutic exercise;Patient/family education;Balance training;Energy conservation;Therapeutic activities;DME and/or AE instruction;Cognitive remediation/compensation    OT Goals(Current goals can be found in the care plan section) Acute Rehab OT Goals Patient Stated Goal: to get back to my "normal" and living ind OT Goal Formulation: With patient Time For Goal Achievement: 12/03/18 Potential to Achieve Goals: Good  OT Frequency: Min 3X/week   Barriers to D/C:            Co-evaluation              AM-PAC OT "6 Clicks"  Daily Activity     Outcome Measure Help from another person eating meals?: None Help from another person taking care of personal grooming?: None(seated) Help from another person toileting, which includes using toliet, bedpan, or urinal?: A Lot Help from another person bathing (including washing, rinsing, drying)?: A Lot   Help from another person to put on and taking off regular lower body clothing?: A Lot 6 Click Score: 14   End of Session Equipment Utilized During Treatment: Gait belt;Rolling walker Nurse Communication: Mobility status  Activity Tolerance: Patient tolerated treatment well Patient left: in chair;with call bell/phone within reach  OT Visit Diagnosis: Other abnormalities of gait and mobility (R26.89);Unsteadiness on feet (R26.81);Pain Pain - Right/Left: Right Pain - part of body: (residual limb)                Time: 1020-1110 OT Time Calculation (min): 50 min Charges:  OT General Charges $OT Visit: 1 Visit OT Evaluation $OT Eval Moderate Complexity: 1 Mod OT Treatments $Self Care/Home Management : 23-37 mins  Zenovia Jarred, MSOT, OTR/L Behavioral Health OT/ Acute Relief OT [facility] Office: [phone]   Zenovia Jarred 11/19/2018, 2:12 PM

## 2018-11-19 NOTE — Progress Notes (Signed)
PROGRESS NOTE    Joseph [name]  ZOX:[id number] DOB: [birth year] DOA: 11/13/2018 PCP: Patient, No Pcp Per    Brief Narrative: 73 y.o.malewith medical history significant fortype 2 diabetes, hypertension, CKD stage II who presents to the ED for evaluation of right foot wound that has been ongoing for the last 2 months.  Patient first noticed a wound to his right foot about 2 months ago. He noticed a small laceration to his right dorsal foot after doing yard work. He was seen in the ED initially on 09/12/2018 and treated with a 5-day course of doxycycline for cellulitis and superficial ulcer. He was seen again on 09/20/2018 in the ED and switched to oral clindamycin. He was seen for follow-up wound check in the ED on 09/23/2018 at which time it was felt his wound was improving. He was seen again on 10/08/2018 and the wound appeared to be healing with chronic necrotic appearance. He was given a prescription for oral Augmentin. He was seen by community health and wellness to establish care on 10/17/2018 during which time he felt his foot pain and wound was improving. He was given oral Augmentin and referred to the wound care clinic Assessment & Plan:   Principal Problem:   Sepsis with acute renal failure (HCC) Active Problems:   DM (diabetes mellitus) (HCC)   Hypertension associated with diabetes (HCC)   Acute renal failure superimposed on stage 2 chronic kidney disease (HCC)   Open wound of right foot   Gangrene of right foot (HCC)   Diabetic polyneuropathy associated with type 2 diabetes mellitus (HCC)   PVD (peripheral vascular disease) (HCC)   Critical lower limb ischemia   Atrial fibrillation (HCC)   Cellulitis of right lower extremity  Sepsis secondary to right foot infection/ right foot gangrene D/c antibiotics  Orthopedics consulted and recommendations given.  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  Appreciate vascular surgery  consult. Outpatient follow up with vascular in 3 months.  Follow blood cultures, so far negative. Marland Kitchen.  ABIs reviewed Afebrile and leukocytosis is improving.     Acute on stage II CKD with metabolic acidosis Improved with fluid resuscitation. Hold nephrotoxins. Avoid NSAIDS  Peripheral vascular disease: Appreciate vascular input,  Will start aspirin and statin on discharge.   Type 2 DM CBG (last 3)  Recent Labs (last 2 labs)        Recent Labs    11/17/18 2123 11/18/18 0708 11/18/18 1204  GLUCAP 74 114* 146*     Resume SSI.  Anemia of blood loss superimposed on  anemia of chronic disease:  S/p 1 UNIT  Of prbc transfusion.  Repeat H&H is between 8 to 9.  Keep H&H greater than 7.  GI consulted to see if EGD / colonoscopy can be done. Patient is refusing at this time. Patient wants to schedule the EGD and colonoscopy as outpatient.  Anemia panel reviewed.   New onset atrial fibrillation with RVR.  Rate better controlled, with amiodarone.   Echocardiogram ordered  Showed preserved LVEF and pseudo normalization and Moderate hypokinesis of the left ventricular, entire anteroseptal wall. tsh wnl. Serial troponin's flattened. .  Pt remains asymptomatic, no chest pain or sob.cardiology consulted and recommendations given.  Continue with amiodarone 200 mg BID and transition to 200mg  daily for 2 weeks .   Hypertension: Well controlled.   Lactic acidosis  Resolved.    DVT prophylaxis:scd's Code Status: full code.  Family Communication: none  Disposition Plan: pending CIR  Consultants:   Gastroenterology.   Orthopedics.   vascular surgery.   Cardiology   Procedures: none.  Antimicrobials: rocephin and vancomycin discontinued on 11/17/18    Estimated body mass index is 25.95 kg/m as calculated from the following:   Height as of this encounter: 6\' 4"  (1.93 m).   Weight as of this encounter: 96.7 kg.     Subjective: Anxious to start therapy    Objective: Vitals:   11/18/18 0509 11/18/18 1206 11/18/18 2148 11/19/18 0530  BP: (!) 138/95 127/72 112/69 107/83  Pulse: 78 75 94 89  Resp: 16 20 16 16   Temp: 100 F (37.8 C) (!) 97.5 F (36.4 C) 98.8 F (37.1 C) 98.6 F (37 C)  TempSrc: Oral Oral Oral Oral  SpO2: 99% 99% 96% 99%  Weight: 96.8 kg   96.7 kg  Height:        Intake/Output Summary (Last 24 hours) at 11/19/2018 1359 Last data filed at 11/19/2018 0533 Gross per 24 hour  Intake 360 ml  Output 1800 ml  Net -1440 ml   Filed Weights   11/17/18 0529 11/18/18 0509 11/19/18 0530  Weight: 93 kg 96.8 kg 96.7 kg    Examination:  General exam: Appears calm and comfortable  Respiratory system: Clear to auscultation. Respiratory effort normal. Cardiovascular system: S1 & S2 heard, RRR. No JVD, murmurs, rubs, gallops or clicks. No pedal edema. Gastrointestinal system: Abdomen is nondistended, soft and nontender. No organomegaly or masses felt. Normal bowel sounds heard. Central nervous system: Alert and oriented. No focal neurological deficits. Extremities: RIGHT bka Skin: No rashes, lesions or ulcers Psychiatry: Judgement and insight appear normal. Mood & affect appropriate.     Data Reviewed: I have personally reviewed following labs and imaging studies  CBC: Recent Labs  Lab 11/13/18 1832  11/15/18 1117 11/16/18 0145 11/17/18 1646 11/18/18 0859 11/19/18 0457  WBC 44.9*   < > 30.5* 27.0* 12.2* 19.3* 16.2*  NEUTROABS 39.5*  --   --   --   --   --   --   HGB 9.7*   < > 9.3* 8.7* 9.0* 8.6* 8.5*  HCT 29.2*   < > 28.2* 26.5* 27.2* 26.7* 26.4*  MCV 91.5   < > 89.2 91.7 90.7 92.1 92.3  PLT 356   < > 366 362 68* PLATELET CLUMPS NOTED ON SMEAR, COUNT APPEARS ADEQUATE 373   < > = values in this interval not displayed.   Basic Metabolic Panel: Recent Labs  Lab 11/15/18 0458 11/15/18 1411 11/16/18 0145 11/17/18 1646 11/18/18 0859 11/19/18 0457  NA 142  --  140 140 135 134*  K 4.1  --  3.7 3.9 3.9 3.9  CL  112*  --  109 107 102 100  CO2 20*  --  19* 21* 23 27  GLUCOSE 206*  --  251* 62* 160* 115*  BUN 40*  --  31* 17 15 16   CREATININE 1.38*  --  1.32* 1.28* 1.11 1.21  CALCIUM 8.7*  --  8.7* 8.4* 8.5* 8.3*  MG  --  2.0  --   --   --   --    GFR: Estimated Creatinine Clearance: 68.7 mL/min (by C-G formula based on SCr of 1.21 mg/dL). Liver Function Tests: Recent Labs  Lab 11/13/18 1832 11/15/18 0458  AST 32 21  ALT 22 20  ALKPHOS 78 67  BILITOT 0.6 0.5  PROT 7.7 6.9  ALBUMIN 2.6* 2.2*   No results for input(s): LIPASE, AMYLASE in the  last 168 hours. No results for input(s): AMMONIA in the last 168 hours. Coagulation Profile: No results for input(s): INR, PROTIME in the last 168 hours. Cardiac Enzymes: Recent Labs  Lab 11/15/18 1411 11/15/18 1930 11/16/18 0145  TROPONINI 0.03* 0.03* 0.03*   BNP (last 3 results) No results for input(s): PROBNP in the last 8760 hours. HbA1C: Recent Labs    11/18/18 0859  HGBA1C 6.4*   CBG: Recent Labs  Lab 11/18/18 0708 11/18/18 1204 11/18/18 1635 11/18/18 2150 11/19/18 0746  GLUCAP 114* 146* 72 143* 139*   Lipid Profile: No results for input(s): CHOL, HDL, LDLCALC, TRIG, CHOLHDL, LDLDIRECT in the last 72 hours. Thyroid Function Tests: No results for input(s): TSH, T4TOTAL, FREET4, T3FREE, THYROIDAB in the last 72 hours. Anemia Panel: No results for input(s): VITAMINB12, FOLATE, FERRITIN, TIBC, IRON, RETICCTPCT in the last 72 hours. Sepsis Labs: Recent Labs  Lab 11/13/18 1832 11/14/18 0006 11/14/18 0236  LATICACIDVEN 4.0* 6.6* 0.4*    Recent Results (from the past 240 hour(s))  Blood Culture (routine x 2)     Status: None   Collection Time: 11/13/18  6:33 PM   Specimen: BLOOD  Result Value Ref Range Status   Specimen Description BLOOD RIGHT ANTECUBITAL  Final   Special Requests   Final    BOTTLES DRAWN AEROBIC AND ANAEROBIC Blood Culture adequate volume   Culture   Final    NO GROWTH 5 DAYS Performed at [facility], [address], [place], [place] [address]    Report Status 11/18/2018 FINAL  Final  SARS Coronavirus 2     Status: None   Collection Time: 11/13/18  6:37 PM  Result Value Ref Range Status   SARS Coronavirus 2 NOT DETECTED NOT DETECTED Final    Comment: (NOTE) SARS-CoV-2 target nucleic acids are NOT DETECTED. The SARS-CoV-2 RNA is generally detectable in upper and lower respiratory specimens during the acute phase of infection.  Negative  results do not preclude SARS-CoV-2 infection, do not rule out co-infections with other pathogens, and should not be used as the sole basis for treatment or other patient management decisions.  Negative results must be combined with clinical observations, patient history, and epidemiological information. The expected result is Not Detected. Fact Sheet for Patients: http://www.biofiredefense.com/wp-content/uploads/2020/03/BIOFIRE-COVID -19-patients.pdf Fact Sheet for Healthcare Providers: http://www.biofiredefense.com/wp-content/uploads/2020/03/BIOFIRE-COVID -19-hcp.pdf This test is not yet approved or cleared by the [place] and  has been authorized for detection and/or diagnosis of SARS-CoV-2 by FDA under an Emergency Use Authorization (EUA).  This EUA will remain in effec t (meaning this test can be used) for the duration of  the COVID-19 declaration under Section 564(b)(1) of the Act, 21 U.S.C. section 360bbb-3(b)(1), unless the authorization is terminated or revoked sooner. Performed at [facility], [place] [address] [place], [place] [address]   Blood Culture (routine x 2)     Status: None   Collection Time: 11/13/18  6:48 PM   Specimen: BLOOD LEFT ARM  Result Value Ref Range Status   Specimen Description BLOOD LEFT ARM  Final   Special Requests   Final    BOTTLES DRAWN AEROBIC AND ANAEROBIC Blood Culture adequate volume   Culture   Final    NO GROWTH 5 DAYS Performed at [facility], [place] [address],  [place], [place] [address]    Report Status 11/18/2018 FINAL  Final  Surgical PCR screen     Status: None   Collection Time: 11/15/18 10:00 PM   Specimen:  Nasal Mucosa; Nasal Swab  Result Value Ref Range Status   MRSA, PCR NEGATIVE NEGATIVE Final   Staphylococcus aureus NEGATIVE NEGATIVE Final    Comment: (NOTE) The Xpert SA Assay (FDA approved for NASAL specimens in patients 71 years of age and older), is one component of a comprehensive surveillance program. It is not intended to diagnose infection nor to guide or monitor treatment. Performed at [facility], [address] [place], [place] [address]          Radiology Studies: No results found.      Scheduled Meds: . sodium chloride   Intravenous Once  . amiodarone  200 mg Oral BID  . docusate sodium  100 mg Oral BID  . gabapentin  300 mg Oral TID  . glipiZIDE  5 mg Oral QAC breakfast  . insulin aspart  0-15 Units Subcutaneous TID WC  . insulin aspart  0-5 Units Subcutaneous QHS  . insulin aspart  3 Units Subcutaneous TID WC  . insulin detemir  8 Units Subcutaneous QHS  . pantoprazole  40 mg Oral BID  . sodium chloride flush  3 mL Intravenous Q12H   Continuous Infusions: . sodium chloride 10 mL/hr at 11/16/18 1739  . methocarbamol (ROBAXIN) IV       LOS: 6 days     Alwyn RenElizabeth G Vishnu Moeller, MD [facility]  If 7PM-7AM, please contact night-coverage [email or web address] Password [email or web address] 11/19/2018, 1:59 PM

## 2018-11-19 NOTE — NC FL2 (Signed)
[place] MEDICAID FL2 LEVEL OF CARE SCREENING TOOL     IDENTIFICATION  Patient Name: Joseph [name] Birthdate: 1946/04/17 Sex: male Admission Date (Current Location): 11/13/2018  [facility] [place]   Facility and Address:  [facility] [place], [address], [place], [place] [address]      Provider Number: [id number]  Attending Physician Name and Address:  Alwyn RenMathews, Elizabeth G, MD  Relative Name and Phone Number:  Sandrea MatteKaelin Palmatier, son, [phone]    Current Level of Care: Hospital Recommended Level of Care: Skilled Nursing Facility Prior Approval Number:    Date Approved/Denied:   PASRR Number: [id number]  Discharge Plan: SNF    Current Diagnoses: Patient Active Problem List   Diagnosis Date Noted  . Cellulitis of right lower extremity   . Atrial fibrillation (HCC)   . Gangrene of right foot (HCC)   . Diabetic polyneuropathy associated with type 2 diabetes mellitus (HCC)   . PVD (peripheral vascular disease) (HCC)   . Critical lower limb ischemia   . Sepsis with acute renal failure (HCC) 11/13/2018  . Acute renal failure superimposed on stage 2 chronic kidney disease (HCC)   . Open wound of right foot   . 'light-for-dates' infant with signs of fetal malnutrition 10/17/2018  . DM (diabetes mellitus) (HCC) 07/02/2012  . Hypertension associated with diabetes (HCC) 07/02/2012    Orientation RESPIRATION BLADDER Height & Weight     Self, Time, Situation, Place  Normal Continent Weight: 213 lb 3 oz (96.7 kg) Height:  6\' 4"  (193 cm)  BEHAVIORAL SYMPTOMS/MOOD NEUROLOGICAL BOWEL NUTRITION STATUS      Continent Diet(carb modified, thin liquids)  AMBULATORY STATUS COMMUNICATION OF NEEDS Skin   Extensive Assist Verbally Wound Vac(wound vac on right leg)                       Personal Care Assistance Level of Assistance  Bathing, Dressing, Feeding Bathing Assistance: Maximum assistance Feeding assistance: Independent Dressing  Assistance: Maximum assistance     Functional Limitations Info  Sight, Speech, Hearing Sight Info: Adequate Hearing Info: Adequate Speech Info: Adequate    SPECIAL CARE FACTORS FREQUENCY  PT (By licensed PT), OT (By licensed OT)     PT Frequency: 5x OT Frequency: 5x            Contractures Contractures Info: Not present    Additional Factors Info  Code Status, Allergies Code Status Info: Full Code Allergies Info: No known allergies           Current Medications (11/19/2018):  This is the current hospital active medication list Current Facility-Administered Medications  Medication Dose Route Frequency Provider Last Rate Last Dose  . 0.9 %  sodium chloride infusion (Manually program via Guardrails IV Fluids)   Intravenous Once Kathlen ModyAkula, Vijaya, MD      . 0.9 %  sodium chloride infusion   Intravenous Continuous Kathlen ModyAkula, Vijaya, MD 10 mL/hr at 11/16/18 1739    . acetaminophen (TYLENOL) tablet 325-650 mg  325-650 mg Oral Q6H PRN Kathlen ModyAkula, Vijaya, MD   650 mg at 11/17/18 1615  . amiodarone (PACERONE) tablet 200 mg  200 mg Oral BID Kathlen ModyAkula, Vijaya, MD   200 mg at 11/19/18 0945  . bisacodyl (DULCOLAX) suppository 10 mg  10 mg Rectal Daily PRN Kathlen ModyAkula, Vijaya, MD      . docusate sodium (COLACE) capsule 100 mg  100 mg Oral BID Kathlen ModyAkula, Vijaya, MD   100 mg at 11/19/18 0946  .  gabapentin (NEURONTIN) capsule 300 mg  300 mg Oral TID Hosie Poisson, MD   300 mg at 11/19/18 0947  . glipiZIDE (GLUCOTROL) tablet 5 mg  5 mg Oral QAC breakfast Hosie Poisson, MD   5 mg at 11/19/18 0946  . HYDROmorphone (DILAUDID) injection 0.5 mg  0.5 mg Intravenous Q4H PRN Hosie Poisson, MD      . HYDROmorphone (DILAUDID) injection 0.5-1 mg  0.5-1 mg Intravenous Q4H PRN Hosie Poisson, MD      . insulin aspart (novoLOG) injection 0-15 Units  0-15 Units Subcutaneous TID WC Hosie Poisson, MD   2 Units at 11/19/18 0949  . insulin aspart (novoLOG) injection 0-5 Units  0-5 Units Subcutaneous QHS Hosie Poisson, MD   2 Units at  11/15/18 2155  . insulin aspart (novoLOG) injection 3 Units  3 Units Subcutaneous TID WC Hosie Poisson, MD   3 Units at 11/19/18 0950  . insulin detemir (LEVEMIR) injection 8 Units  8 Units Subcutaneous QHS Hosie Poisson, MD   8 Units at 11/18/18 2228  . magnesium citrate solution 1 Bottle  1 Bottle Oral Once PRN Hosie Poisson, MD      . methocarbamol (ROBAXIN) tablet 500 mg  500 mg Oral Q6H PRN Hosie Poisson, MD   500 mg at 11/16/18 1253   Or  . methocarbamol (ROBAXIN) 500 mg in dextrose 5 % 50 mL IVPB  500 mg Intravenous Q6H PRN Hosie Poisson, MD      . metoCLOPramide (REGLAN) tablet 5-10 mg  5-10 mg Oral Q8H PRN Hosie Poisson, MD       Or  . metoCLOPramide (REGLAN) injection 5-10 mg  5-10 mg Intravenous Q8H PRN Hosie Poisson, MD      . ondansetron (ZOFRAN) tablet 4 mg  4 mg Oral Q6H PRN Hosie Poisson, MD       Or  . ondansetron (ZOFRAN) injection 4 mg  4 mg Intravenous Q6H PRN Hosie Poisson, MD      . oxyCODONE (Oxy IR/ROXICODONE) immediate release tablet 10-15 mg  10-15 mg Oral Q4H PRN Hosie Poisson, MD   15 mg at 11/19/18 0539  . oxyCODONE (Oxy IR/ROXICODONE) immediate release tablet 5-10 mg  5-10 mg Oral Q4H PRN Hosie Poisson, MD   10 mg at 11/18/18 0826  . pantoprazole (PROTONIX) EC tablet 40 mg  40 mg Oral BID Hosie Poisson, MD   40 mg at 11/19/18 0947  . polyethylene glycol (MIRALAX / GLYCOLAX) packet 17 g  17 g Oral Daily PRN Hosie Poisson, MD      . sodium chloride flush (NS) 0.9 % injection 3 mL  3 mL Intravenous Q12H Hosie Poisson, MD   3 mL at 11/19/18 1062     Discharge Medications: Please see discharge summary for a list of discharge medications.  Relevant Imaging Results:  Relevant Lab Results:   Additional Information IRS:[id number]  Eileen Stanford, LCSW

## 2018-11-19 NOTE — Progress Notes (Signed)
Inpatient Rehab Admissions:  Inpatient Rehab Consult received.  I met with patient at the bedside for rehabilitation assessment and to discuss goals and expectations of an inpatient rehab admission.  He is interested in CIR, but reports that his family lives in [place].  I was able to contact his friend in [place] who states that she and his children are going to see if they can pull together 24/7 supervision for patient. I will f/u with them later today/tomorrow to see about possible admission if they are able to provide 24/7.   Signed: Shann Medal, PT, DPT Admissions Coordinator [phone] 11/19/18  2:12 PM

## 2018-11-19 NOTE — TOC Initial Note (Addendum)
Transition of Care [facility](TOC) - Initial/Assessment Note    Patient Details  Name: Joseph [name] MRN: [id number] Date of Birth: Mar 23, 1946  Transition of Care [facility](TOC) CM/SW Contact:    Maree KrabbeBridget A Cori Henningsen, LCSW Phone Number: 11/19/2018, 11:40 AM  Clinical Narrative:   Pt is alert and oriented. CSW spoke with pt to complete SNF backup workup. Pt understands the recommendation is CIR however if unable to go to CIR pt agreeable to SNF as he lives alone. CSW provided list at bedside. Pt will look over list and follow up with CSW.  Pt will need updated COVID-19 test if going to SNF, must be 48 hours within d/c to SNF.              Expected Discharge Plan: IP Rehab Facility Barriers to Discharge: Continued Medical Work up   Patient Goals and CMS Choice Patient states their goals for this hospitalization and ongoing recovery are:: to get better CMS Medicare.gov Compare Post Acute Care list provided to:: Patient Choice offered to / list presented to : Patient  Expected Discharge Plan and Services Expected Discharge Plan: IP Rehab Facility In-house Referral: NA   Post Acute Care Choice: IP Rehab, Skilled Nursing Facility Living arrangements for the past 2 months: Single Family Home                           HH Arranged: NA          Prior Living Arrangements/Services Living arrangements for the past 2 months: Single Family Home Lives with:: Self Patient language and need for interpreter reviewed:: Yes Do you feel safe going back to the place where you live?: No   needs assistance, lives alone  Need for Family Participation in Patient Care: Yes (Comment)     Criminal Activity/Legal Involvement Pertinent to Current Situation/Hospitalization: No - Comment as needed  Activities of Daily Living Home Assistive Devices/Equipment: Cane (specify quad or straight), CBG Meter, Eyeglasses ADL Screening (condition at time of admission) Patient's cognitive ability adequate to safely complete daily  activities?: Yes Is the patient deaf or have difficulty hearing?: No Does the patient have difficulty seeing, even when wearing glasses/contacts?: No Does the patient have difficulty concentrating, remembering, or making decisions?: No Patient able to express need for assistance with ADLs?: Yes Does the patient have difficulty dressing or bathing?: No Independently performs ADLs?: Yes (appropriate for developmental age) Does the patient have difficulty walking or climbing stairs?: Yes Weakness of Legs: Right Weakness of Arms/Hands: None  Permission Sought/Granted Permission sought to share information with : Facility Medical sales representativeContact Representative, Family Supports    Share Information with NAME: Greg CutterKaelin     Permission granted to share info w Relationship: son     Emotional Assessment Appearance:: Appears stated age Attitude/Demeanor/Rapport: Engaged Affect (typically observed): Accepting, Appropriate, Calm Orientation: : Oriented to Self, Oriented to Place, Oriented to  Time, Oriented to Situation Alcohol / Substance Use: Not Applicable Psych Involvement: No (comment)  Admission diagnosis:  Infection [B99.9] Tachycardia [R00.0] Cellulitis of right lower extremity [L03.115] AKI (acute kidney injury) (HCC) [N17.9] Atrial fibrillation, unspecified type (HCC) [I48.91] Gangrene of toe of right foot (HCC) [I96] Sepsis, due to unspecified organism, unspecified whether acute organ dysfunction present [facility](HCC) [A41.9] Patient Active Problem List   Diagnosis Date Noted  . Cellulitis of right lower extremity   . Atrial fibrillation (HCC)   . Gangrene of right foot (HCC)   . Diabetic polyneuropathy associated with type 2 diabetes mellitus (  [place])   . PVD (peripheral vascular disease) ([place])   . Critical lower limb ischemia   . Sepsis with acute renal failure ([place]) 11/13/2018  . Acute renal failure superimposed on stage 2 chronic kidney disease ([place])   . Open wound of right foot   .  'light-for-dates' infant with signs of fetal malnutrition 10/17/2018  . DM (diabetes mellitus) ([place]) 07/02/2012  . Hypertension associated with diabetes ([place]) 07/02/2012   PCP:  Patient, No Pcp Per Pharmacy:   [facility], [address] [place] [address] Phone: [phone] Fax: [phone]     Social Determinants of Health (SDOH) Interventions    Readmission Risk Interventions No flowsheet data found.

## 2018-11-19 NOTE — Care Management Important Message (Signed)
Important Message  Patient Details  Name: Joseph [name] MRN: [id number] Date of Birth: 02-27-1946   Medicare Important Message Given:  Yes     Shelda Altes 11/19/2018, 1:12 PM

## 2018-11-19 NOTE — Progress Notes (Signed)
Patient ID: Joseph [name], male   DOB: 1945/11/22, 73 y.o.   MRN: [id number] Patient is postoperative day 3 right transtibial amputation.  There is no drainage in the wound VAC canister.  Patient will work with Museum/gallery curator for prosthetic fitting.  And anticipate discharge to inpatient or outpatient rehab.  Patient has no complaints and is comfortable.

## 2018-11-19 NOTE — H&P (Signed)
Physical Medicine and Rehabilitation Admission H&P    Chief Complaint  Patient presents with   Tachycardia  Chief complaint: Weakness HPI: Joseph [name] is a 73 year old right-handed male with history of type 2 diabetes mellitus, hypertension, chronic diastolic congestive heart failure, CKD stage II.  Per chart review patient lives alone independent prior to admission.  Presented 11/13/2018 with right foot ischemic changes with nausea.  Patient reportedly had scraped his foot approximately 2 months ago while doing some tree work.  X-rays revealed soft tissue ulcer about the fifth metatarsophageal joint with soft tissue air.  No osteomyelitis.  MRI of the foot completed again gas in the fifth metatarsal head consistent with osteomyelitis.  Follow-up orthopedic service Dr. Lajoyce Cornersuda initially with conservative care.  Hospital course complicated by anemia hemoglobin 6.7 with gastroenterology consulted 11/14/2018 recommendations of colonoscopy of which patient refused and maintained on PPI with recommendation to transfuse if symptomatic.  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 Lajoyce Cornersuda.  Hospital course pain management as well as leukocytosis 30,500 maintained on antibiotic therapy.  Patient developed atrial fibrillation with runs of SVT cardiology services consulted 11/15/2018 and he did receive IV Lopressor and Cardizem with little response with IV amiodarone started and patient converted to sinus rhythm on the evening of 11/15/2018.  Plan was to begin aspirin on discharge.  Echocardiogram with ejection fraction of 55% moderate hypokinesis of the left ventricular, entire anterior septal wall.  Patient placed on amiodarone 200 mg twice daily cardiac rate controlled.  Leukocytosis improved 16,000 antibiotic therapy has been discontinued.  Hemoglobin continues to stabilize 8.5.  Therapy evaluations completed and patient was  admitted for a comprehensive rehab program.  Review of Systems  Constitutional: Positive for fever. Negative for chills.  HENT: Negative for hearing loss.   Eyes: Negative for blurred vision and double vision.  Respiratory: Negative for cough and shortness of breath.   Cardiovascular: Positive for leg swelling. Negative for chest pain and palpitations.  Gastrointestinal: Positive for constipation. Negative for heartburn, nausea and vomiting.  Genitourinary: Negative for flank pain and hematuria.  Musculoskeletal: Positive for myalgias.  Skin: Negative for rash.  Neurological: Positive for headaches.  All other systems reviewed and are negative.  Past Medical History:  Diagnosis Date   Diabetes mellitus type 2 in nonobese [facility](HCC)    Hypertension    Past Surgical History:  Procedure Laterality Date   AMPUTATION Right 11/16/2018   Procedure: RIGHT BELOW KNEE AMPUTATION;  Surgeon: Nadara Mustarduda, Marcus V, MD;  Location[facility] OR;  Service: Orthopedics;  Laterality: Right;   HAND SURGERY     Family History  Problem Relation Age of Onset   Diabetes Father    Social History:  reports that he has never smoked. He has never used smokeless tobacco. He reports current alcohol use. He reports that he does not use drugs. Allergies: No Known Allergies Medications Prior to Admission  Medication Sig Dispense Refill   Aspirin-Salicylamide-Caffeine (BC HEADACHE POWDER PO) Take 4 packets by mouth 4 (four) times daily as needed (for pain).      glipiZIDE (GLUCOTROL) 5 MG tablet Take 1 tablet (5 mg total) by mouth daily before breakfast. 90 tablet 0   HYDROcodone-acetaminophen (NORCO/VICODIN) 5-325 MG tablet Take 1 tablet by mouth every 6 (six) hours as needed for severe pain. 10 tablet 0   lisinopril-hydrochlorothiazide (ZESTORETIC) 20-12.5 MG tablet Take 1 tablet by mouth daily. 90 tablet 3  metFORMIN (GLUCOPHAGE) 1000 MG tablet Take 1 tablet (1,000 mg total) by mouth 2 (two) times daily. 180 tablet 1    Multiple Vitamins-Minerals (ONE-A-DAY MENS 50+ ADVANTAGE) TABS Take 1 tablet by mouth daily with breakfast.     naproxen sodium (ALEVE) 220 MG tablet Take 440 mg by mouth 2 (two) times daily as needed (for pain).     mupirocin ointment (BACTROBAN) 2 % Apply bid to wound for 10 dyas (Patient not taking: Reported on 11/13/2018) 22 g 0    Drug Regimen Review Drug regimen was reviewed and remains appropriate with no significant issues identified  Home: Home Living Family/patient expects to be discharged to:: Private residence Living Arrangements: Alone Available Help at Discharge: Family, Available PRN/intermittently, Friend(s) Type of Home: House Home Access: Stairs to enter Secretary/administratorntrance Stairs-Number of Steps: 5+1+1 Entrance Stairs-Rails: Right, Left Home Layout: Other (Comment)(split level home) Bathroom Toilet: Standard Home Equipment: Cane - single point Additional Comments: was out driving and independent with no AD prior to this   Functional History: Prior Function Level of Independence: Independent Comments: driving, mowing his lawn, very active prior  Functional Status:  Mobility: Bed Mobility Overal bed mobility: Needs Assistance Bed Mobility: Supine to Sit, Sit to Supine Supine to sit: Min assist Sit to supine: Mod assist General bed mobility comments: assist for bilat LEs, increased time Transfers Overall transfer level: Needs assistance Equipment used: Rolling walker (2 wheeled) Transfers: Lateral/Scoot Transfers Sit to Stand: Max assist, From elevated surface  Lateral/Scoot Transfers: Min assist General transfer comment: pt able to scoot from bed to recliner chair wtih min A for LE placement and forward wt shift, cues for UE placement Ambulation/Gait General Gait Details: unable    ADL: ADL Overall ADL's : Needs assistance/impaired Eating/Feeding: Independent, Sitting Grooming: Set up, Sitting Upper Body Bathing: Set up, Sitting Lower Body Bathing:  Maximal assistance, With adaptive equipment, Sitting/lateral leans, Sit to/from stand Upper Body Dressing : Set up, Sitting Lower Body Dressing: Maximal assistance, Sitting/lateral leans, Sit to/from stand, With adaptive equipment Toilet Transfer: Maximal assistance, Squat-pivot, BSC, RW Toileting- Clothing Manipulation and Hygiene: Minimal assistance, Sitting/lateral lean Tub/ Shower Transfer: Maximal assistance, Shower seat, Squat-pivot, Ambulation Functional mobility during ADLs: Maximal assistance, Rolling walker General ADL Comments: currently a max A for transfers due to unfamiliarity with DME and balance compromises  Cognition: Cognition Overall Cognitive Status: Within Functional Limits for tasks assessed Orientation Level: Oriented X4 Cognition Arousal/Alertness: Awake/alert Behavior During Therapy: WFL for tasks assessed/performed Overall Cognitive Status: Within Functional Limits for tasks assessed General Comments: occasionally tangential  Physical Exam: Blood pressure 107/75, pulse 78, temperature 98.8 F (37.1 C), temperature source Oral, resp. rate 16, height 6\' 4"  (1.93 m), weight 92.3 kg, SpO2 98 %. Physical Exam  Constitutional: He is oriented to person, place, and time. He appears well-nourished. No distress.  HENT:  Head: Normocephalic and atraumatic.  Eyes: Pupils are equal, round, and reactive to light. EOM are normal. Left eye exhibits no discharge.  Neck: Normal range of motion. No thyromegaly present.  Cardiovascular: Normal rate and regular rhythm.  No murmur heard. Respiratory: Effort normal and breath sounds normal. No respiratory distress. He has no wheezes.  GI: Soft. He exhibits no distension.  Musculoskeletal:     Comments: Right leg in vac dressing, associated edema.   Neurological: He is alert and oriented to person, place, and time. No cranial nerve deficit.  UE 5/5. RLE 3/5 HF, LLE: 4/5 HF, KE , ADF, APF. No focal sensory findings.   Skin:  Skin  is warm. He is not diaphoretic.  Limb guard in place to right BKA site.  Psychiatric: He has a normal mood and affect. His behavior is normal. Thought content normal.    Results for orders placed or performed during the hospital encounter of 11/13/18 (from the past 48 hour(s))  Glucose, capillary     Status: Abnormal   Collection Time: 11/18/18 12:04 PM  Result Value Ref Range   Glucose-Capillary 146 (H) 70 - 99 mg/dL  Glucose, capillary     Status: None   Collection Time: 11/18/18  4:35 PM  Result Value Ref Range   Glucose-Capillary 72 70 - 99 mg/dL  Glucose, capillary     Status: Abnormal   Collection Time: 11/18/18  9:50 PM  Result Value Ref Range   Glucose-Capillary 143 (H) 70 - 99 mg/dL  CBC     Status: Abnormal   Collection Time: 11/19/18  4:57 AM  Result Value Ref Range   WBC 16.2 (H) 4.0 - 10.5 K/uL   RBC 2.86 (L) 4.22 - 5.81 MIL/uL   Hemoglobin 8.5 (L) 13.0 - 17.0 g/dL   HCT 16.126.4 (L) 09.639.0 - 04.552.0 %   MCV 92.3 80.0 - 100.0 fL   MCH 29.7 26.0 - 34.0 pg   MCHC 32.2 30.0 - 36.0 g/dL   RDW 40.913.5 81.111.5 - 91.415.5 %   Platelets 373 150 - 400 K/uL   nRBC 0.0 0.0 - 0.2 %    Comment: Performed at [facility], [address] [place], [place] [address]  Basic metabolic panel     Status: Abnormal   Collection Time: 11/19/18  4:57 AM  Result Value Ref Range   Sodium 134 (L) 135 - 145 mmol/L   Potassium 3.9 3.5 - 5.1 mmol/L   Chloride 100 98 - 111 mmol/L   CO2 27 22 - 32 mmol/L   Glucose, Bld 115 (H) 70 - 99 mg/dL   BUN 16 8 - 23 mg/dL   Creatinine, Ser 6.211.21 0.61 - 1.24 mg/dL   Calcium 8.3 (L) 8.9 - 10.3 mg/dL   GFR calc non Af Amer 60 (L) >60 mL/min   GFR calc Af Amer >60 >60 mL/min   Anion gap 7 5 - 15    Comment: Performed at [facility], [address] [place], [place] [address]  Glucose, capillary     Status: Abnormal   Collection Time: 11/19/18  7:46 AM  Result Value Ref Range   Glucose-Capillary 139 (H) 70 - 99 mg/dL  Glucose, capillary     Status: None    Collection Time: 11/19/18  4:45 PM  Result Value Ref Range   Glucose-Capillary 85 70 - 99 mg/dL  Glucose, capillary     Status: Abnormal   Collection Time: 11/19/18  9:08 PM  Result Value Ref Range   Glucose-Capillary 135 (H) 70 - 99 mg/dL  Glucose, capillary     Status: Abnormal   Collection Time: 11/20/18  7:53 AM  Result Value Ref Range   Glucose-Capillary 152 (H) 70 - 99 mg/dL   No results found.     Medical Problem List and Plan: 1.  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  Antithrombotics: -DVT/anticoagulation: SCDs left lower extremity  -antiplatelet therapy: Await plan to begin aspirin 3. Pain Management: Neurontin 300 mg 3 times daily, Robaxin and oxycodone as needed 4. Mood: Provide emotional support  -antipsychotic agents:  N/A 5. Neuropsych: This patient is capable of making decisions on his own behalf. 6. Skin/Wound Care:  Vac and limb guard in place  -continue vac through 6/25  7. Fluids/Electrolytes/Nutrition: Routine in and outs with follow-up chemistries 8.  Acute blood loss anemia.  Patient refused any GI work-up.  Follow-up CBC upon admit.   Most recent hgb 8.5 6/22 9.  Acute onset atrial fibrillation.  Amiodarone 200 mg twice daily.  Cardiology follow-up 10.  Chronic diastolic congestive heart failure.  Lasix 40 mg daily 11.  Diabetes mellitus with peripheral neuropathy.  Hemoglobin A1c 5.7.  Glucotrol 5 mg daily, NovoLog 3 units 3 times daily, Levemir 8 units nightly.  Check blood sugars before meals and at bedtime 12.  CKD stage II.  Follow-up chemistries on admit  -Cr 1.4 13.  Constipation.  Laxative assistance       Cathlyn Parsons, PA-C 11/20/2018

## 2018-11-19 NOTE — Care Management Important Message (Signed)
Important Message  Patient Details  Name: [name] Grose MRN: [id number] Date of Birth: 1945-09-22   Medicare Important Message Given:  Yes     Shelda Altes 11/19/2018, 1:13 PM

## 2018-11-20 ENCOUNTER — Other Ambulatory Visit: Payer: Self-pay

## 2018-11-20 ENCOUNTER — Inpatient Hospital Stay (HOSPITAL_COMMUNITY)
Admission: RE | Admit: 2018-11-20 | Discharge: 2018-12-04 | DRG: 560 | Disposition: A | Discharge status: Home Health | Payer: Medicare Other | Source: Intra-hospital | Attending: Physical Medicine & Rehabilitation | Admitting: Physical Medicine & Rehabilitation

## 2018-11-20 DIAGNOSIS — Z7984 Long term (current) use of oral hypoglycemic drugs: Secondary | ICD-10-CM

## 2018-11-20 DIAGNOSIS — N183 Chronic kidney disease, stage 3 (moderate): Secondary | ICD-10-CM

## 2018-11-20 DIAGNOSIS — E46 Unspecified protein-calorie malnutrition: Secondary | ICD-10-CM

## 2018-11-20 DIAGNOSIS — E8809 Other disorders of plasma-protein metabolism, not elsewhere classified: Secondary | ICD-10-CM | POA: Diagnosis present

## 2018-11-20 DIAGNOSIS — Z79899 Other long term (current) drug therapy: Secondary | ICD-10-CM | POA: Diagnosis not present

## 2018-11-20 DIAGNOSIS — Z791 Long term (current) use of non-steroidal anti-inflammatories (NSAID): Secondary | ICD-10-CM | POA: Diagnosis not present

## 2018-11-20 DIAGNOSIS — M25562 Pain in left knee: Secondary | ICD-10-CM | POA: Diagnosis present

## 2018-11-20 DIAGNOSIS — I739 Peripheral vascular disease, unspecified: Secondary | ICD-10-CM

## 2018-11-20 DIAGNOSIS — Z79891 Long term (current) use of opiate analgesic: Secondary | ICD-10-CM

## 2018-11-20 DIAGNOSIS — G8918 Other acute postprocedural pain: Secondary | ICD-10-CM

## 2018-11-20 DIAGNOSIS — K5900 Constipation, unspecified: Secondary | ICD-10-CM | POA: Diagnosis present

## 2018-11-20 DIAGNOSIS — N182 Chronic kidney disease, stage 2 (mild): Secondary | ICD-10-CM | POA: Diagnosis present

## 2018-11-20 DIAGNOSIS — D62 Acute posthemorrhagic anemia: Secondary | ICD-10-CM | POA: Diagnosis present

## 2018-11-20 DIAGNOSIS — Z7982 Long term (current) use of aspirin: Secondary | ICD-10-CM

## 2018-11-20 DIAGNOSIS — I5032 Chronic diastolic (congestive) heart failure: Secondary | ICD-10-CM

## 2018-11-20 DIAGNOSIS — Z833 Family history of diabetes mellitus: Secondary | ICD-10-CM

## 2018-11-20 DIAGNOSIS — Z4781 Encounter for orthopedic aftercare following surgical amputation: Secondary | ICD-10-CM | POA: Diagnosis not present

## 2018-11-20 DIAGNOSIS — I130 Hypertensive heart and chronic kidney disease with heart failure and stage 1 through stage 4 chronic kidney disease, or unspecified chronic kidney disease: Secondary | ICD-10-CM | POA: Diagnosis present

## 2018-11-20 DIAGNOSIS — Z89511 Acquired absence of right leg below knee: Secondary | ICD-10-CM

## 2018-11-20 DIAGNOSIS — N179 Acute kidney failure, unspecified: Secondary | ICD-10-CM | POA: Diagnosis not present

## 2018-11-20 DIAGNOSIS — E1122 Type 2 diabetes mellitus with diabetic chronic kidney disease: Secondary | ICD-10-CM | POA: Diagnosis present

## 2018-11-20 DIAGNOSIS — E441 Mild protein-calorie malnutrition: Secondary | ICD-10-CM | POA: Diagnosis present

## 2018-11-20 DIAGNOSIS — Z6825 Body mass index (BMI) 25.0-25.9, adult: Secondary | ICD-10-CM

## 2018-11-20 DIAGNOSIS — I4891 Unspecified atrial fibrillation: Secondary | ICD-10-CM

## 2018-11-20 DIAGNOSIS — E1165 Type 2 diabetes mellitus with hyperglycemia: Secondary | ICD-10-CM | POA: Diagnosis not present

## 2018-11-20 DIAGNOSIS — E1142 Type 2 diabetes mellitus with diabetic polyneuropathy: Secondary | ICD-10-CM | POA: Diagnosis present

## 2018-11-20 DIAGNOSIS — D72829 Elevated white blood cell count, unspecified: Secondary | ICD-10-CM | POA: Diagnosis present

## 2018-11-20 LAB — BASIC METABOLIC PANEL
Anion gap: 9 (ref 5–15)
BUN: 21 mg/dL (ref 8–23)
CO2: 25 mmol/L (ref 22–32)
Calcium: 8.8 mg/dL — ABNORMAL LOW (ref 8.9–10.3)
Chloride: 101 mmol/L (ref 98–111)
Creatinine, Ser: 1.4 mg/dL — ABNORMAL HIGH (ref 0.61–1.24)
GFR calc Af Amer: 58 mL/min — ABNORMAL LOW (ref >60)
GFR calc non Af Amer: 50 mL/min — ABNORMAL LOW (ref >60)
Glucose, Bld: 168 mg/dL — ABNORMAL HIGH (ref 70–99)
Potassium: 4.6 mmol/L (ref 3.5–5.1)
Sodium: 135 mmol/L (ref 135–145)

## 2018-11-20 LAB — GLUCOSE, CAPILLARY
Glucose-Capillary: 152 mg/dL — ABNORMAL HIGH (ref 70–99)
Glucose-Capillary: 112 mg/dL — ABNORMAL HIGH (ref 70–99)
Glucose-Capillary: 214 mg/dL — ABNORMAL HIGH (ref 70–99)

## 2018-11-20 MED ORDER — POTASSIUM CHLORIDE CRYS ER 20 MEQ PO TBCR: 20.0000 meq | EXTENDED_RELEASE_TABLET | Freq: Every day | ORAL | Status: DC

## 2018-11-20 MED ORDER — PANTOPRAZOLE SODIUM 40 MG PO TBEC
40.0000 mg | DELAYED_RELEASE_TABLET | Freq: Two times a day (BID) | ORAL | Status: DC
  Administered 2018-11-20 – 2018-12-04 (×28): 40 mg via ORAL
  Filled 2018-11-20 (×28): qty 1

## 2018-11-20 MED ORDER — AMIODARONE HCL 200 MG PO TABS
200.0000 mg | ORAL_TABLET | Freq: Two times a day (BID) | ORAL | Status: DC
  Administered 2018-11-20 – 2018-12-04 (×28): 200 mg via ORAL
  Filled 2018-11-20 (×28): qty 1

## 2018-11-20 MED ORDER — POLYETHYLENE GLYCOL 3350 17 G PO PACK: 17.0000 g | PACK | Freq: Every day | ORAL | Status: DC | PRN

## 2018-11-20 MED ORDER — INSULIN ASPART 100 UNIT/ML ~~LOC~~ SOLN
3.0000 [IU] | Freq: Three times a day (TID) | SUBCUTANEOUS | Status: DC
  Administered 2018-11-20 – 2018-12-04 (×36): 3 [IU] via SUBCUTANEOUS

## 2018-11-20 MED ORDER — PANTOPRAZOLE SODIUM 40 MG PO TBEC: 40.0000 mg | DELAYED_RELEASE_TABLET | Freq: Two times a day (BID) | ORAL | 1 refills | Status: DC

## 2018-11-20 MED ORDER — OXYCODONE HCL 5 MG PO TABS
5.0000 mg | ORAL_TABLET | ORAL | Status: DC | PRN
  Administered 2018-11-20 – 2018-11-21 (×4): 10 mg via ORAL
  Administered 2018-11-22: 5 mg via ORAL
  Administered 2018-11-22 (×2): 10 mg via ORAL
  Administered 2018-11-23 (×2): 5 mg via ORAL
  Administered 2018-11-23 – 2018-11-24 (×3): 10 mg via ORAL
  Administered 2018-11-24: 5 mg via ORAL
  Administered 2018-11-24 – 2018-12-04 (×20): 10 mg via ORAL
  Filled 2018-11-20 (×5): qty 2
  Filled 2018-11-20: qty 1
  Filled 2018-11-20 (×9): qty 2
  Filled 2018-11-20: qty 1
  Filled 2018-11-20 (×3): qty 2
  Filled 2018-11-20: qty 1
  Filled 2018-11-20 (×11): qty 2
  Filled 2018-11-20: qty 1
  Filled 2018-11-20 (×2): qty 2

## 2018-11-20 MED ORDER — INSULIN ASPART 100 UNIT/ML ~~LOC~~ SOLN
0.0000 [IU] | Freq: Three times a day (TID) | SUBCUTANEOUS | Status: DC
  Administered 2018-11-21 – 2018-11-30 (×3): 2 [IU] via SUBCUTANEOUS
  Administered 2018-12-02: 3 [IU] via SUBCUTANEOUS
  Administered 2018-12-02: 2 [IU] via SUBCUTANEOUS

## 2018-11-20 MED ORDER — BISACODYL 10 MG RE SUPP: 10.0000 mg | Freq: Every day | RECTAL | Status: DC | PRN

## 2018-11-20 MED ORDER — METHOCARBAMOL 500 MG PO TABS
500.0000 mg | ORAL_TABLET | Freq: Four times a day (QID) | ORAL | Status: DC | PRN
  Administered 2018-11-20 – 2018-11-24 (×3): 500 mg via ORAL
  Filled 2018-11-20 (×3): qty 1

## 2018-11-20 MED ORDER — ONDANSETRON HCL 4 MG PO TABS
4.0000 mg | ORAL_TABLET | Freq: Four times a day (QID) | ORAL | Status: DC | PRN
  Filled 2018-11-20: qty 1

## 2018-11-20 MED ORDER — ACETAMINOPHEN 325 MG PO TABS: 325.0000 mg | ORAL_TABLET | Freq: Four times a day (QID) | ORAL | Status: DC | PRN

## 2018-11-20 MED ORDER — POTASSIUM CHLORIDE CRYS ER 20 MEQ PO TBCR
20.0000 meq | EXTENDED_RELEASE_TABLET | Freq: Every day | ORAL | Status: DC
  Administered 2018-11-21 – 2018-12-04 (×14): 20 meq via ORAL
  Filled 2018-11-20 (×14): qty 1

## 2018-11-20 MED ORDER — POLYETHYLENE GLYCOL 3350 17 G PO PACK: 17.0000 g | PACK | Freq: Every day | ORAL | 0 refills | Status: DC | PRN

## 2018-11-20 MED ORDER — ACETAMINOPHEN 325 MG PO TABS
325.0000 mg | ORAL_TABLET | Freq: Four times a day (QID) | ORAL | Status: DC | PRN
  Administered 2018-11-22 – 2018-11-26 (×4): 650 mg via ORAL
  Filled 2018-11-20 (×4): qty 2

## 2018-11-20 MED ORDER — GABAPENTIN 300 MG PO CAPS
300.0000 mg | ORAL_CAPSULE | Freq: Three times a day (TID) | ORAL | Status: DC
  Administered 2018-11-20 – 2018-12-04 (×41): 300 mg via ORAL
  Filled 2018-11-20 (×40): qty 1

## 2018-11-20 MED ORDER — FUROSEMIDE 40 MG PO TABS: 40.0000 mg | ORAL_TABLET | Freq: Every day | ORAL | 1 refills | Status: DC

## 2018-11-20 MED ORDER — INSULIN DETEMIR 100 UNIT/ML ~~LOC~~ SOLN: 8.0000 [IU] | Freq: Every day | SUBCUTANEOUS | 11 refills | Status: DC

## 2018-11-20 MED ORDER — FUROSEMIDE 40 MG PO TABS: 40.0000 mg | ORAL_TABLET | Freq: Every day | ORAL | Status: DC

## 2018-11-20 MED ORDER — POTASSIUM CHLORIDE CRYS ER 20 MEQ PO TBCR: 20.0000 meq | EXTENDED_RELEASE_TABLET | Freq: Every day | ORAL | 1 refills | Status: DC

## 2018-11-20 MED ORDER — INSULIN DETEMIR 100 UNIT/ML ~~LOC~~ SOLN
8.0000 [IU] | Freq: Every day | SUBCUTANEOUS | Status: DC
  Administered 2018-11-20 – 2018-12-03 (×14): 8 [IU] via SUBCUTANEOUS
  Filled 2018-11-20 (×15): qty 0.08

## 2018-11-20 MED ORDER — AMIODARONE HCL 200 MG PO TABS: 200.0000 mg | ORAL_TABLET | Freq: Two times a day (BID) | ORAL | 1 refills | Status: DC

## 2018-11-20 MED ORDER — ONDANSETRON HCL 4 MG/2ML IJ SOLN
4.0000 mg | Freq: Four times a day (QID) | INTRAMUSCULAR | Status: DC | PRN
  Filled 2018-11-20: qty 2

## 2018-11-20 MED ORDER — GLIPIZIDE 5 MG PO TABS
5.0000 mg | ORAL_TABLET | Freq: Every day | ORAL | Status: DC
  Administered 2018-11-21 – 2018-12-04 (×14): 5 mg via ORAL
  Filled 2018-11-20 (×14): qty 1

## 2018-11-20 MED ORDER — METHOCARBAMOL 1000 MG/10ML IJ SOLN
500.0000 mg | Freq: Four times a day (QID) | INTRAVENOUS | Status: DC | PRN
  Filled 2018-11-20: qty 5

## 2018-11-20 MED ORDER — INSULIN ASPART 100 UNIT/ML ~~LOC~~ SOLN: 3.0000 [IU] | Freq: Three times a day (TID) | SUBCUTANEOUS | 11 refills | Status: DC

## 2018-11-20 MED ORDER — GABAPENTIN 300 MG PO CAPS: 300.0000 mg | ORAL_CAPSULE | Freq: Three times a day (TID) | ORAL | 1 refills | Status: DC

## 2018-11-20 MED ORDER — FUROSEMIDE 10 MG/ML IJ SOLN
40.0000 mg | Freq: Once | INTRAMUSCULAR | Status: AC
  Administered 2018-11-20: 40 mg via INTRAVENOUS
  Filled 2018-11-20: qty 4

## 2018-11-20 MED ORDER — SORBITOL 70 % SOLN
30.0000 mL | Freq: Every day | Status: DC | PRN
  Administered 2018-11-30: 30 mL via ORAL
  Filled 2018-11-20: qty 30

## 2018-11-20 MED ORDER — FUROSEMIDE 40 MG PO TABS
40.0000 mg | ORAL_TABLET | Freq: Every day | ORAL | Status: DC
  Administered 2018-11-21 – 2018-12-04 (×14): 40 mg via ORAL
  Filled 2018-11-20 (×14): qty 1

## 2018-11-20 MED ORDER — POTASSIUM CHLORIDE CRYS ER 20 MEQ PO TBCR: 20.0000 meq | EXTENDED_RELEASE_TABLET | Freq: Once | ORAL | Status: DC

## 2018-11-20 NOTE — Progress Notes (Addendum)
Inpatient Rehab Admissions Coordinator:   I was able to confirm 24/7 assist with pt's family. Plan for admission to CIR today pending approval from attending.   Addendum: I have approval from Dr. Zigmund Daniel for pt to admit to CIR today. I have let pt/family, and CM know.   Shann Medal, PT, DPT Admissions Coordinator [phone] 11/20/18  11:33 AM

## 2018-11-20 NOTE — Progress Notes (Signed)
Meredith Staggers, MD  Physician  Physical Medicine and Rehabilitation  PMR Pre-admission  Signed  Date of Service:  11/20/2018 10:22 AM      Related encounter: ED to Hosp-Admission (Current) from 11/13/2018 in [facility]      Signed         Show:Clear all [x] Manual[x] Template[x] Copied  Added by: [x] Meredith Staggers, MD[x] Michel Santee, PT  [] Hover for details PMR Admission Coordinator Pre-Admission Assessment  Patient: Joseph [name] is an 73 y.o., male MRN: [id number] DOB: 06-10-1945 Height: 6' 4"  (193 cm) Weight: 92.3 kg  Insurance Information HMO:     PPO:      PCP:      IPA:      80/20:      OTHER:  PRIMARY: Medicare Part A and B      Policy#: [id number]      Subscriber: patient CM Name:       Phone#:      Fax#:  Pre-Cert#: verified online via [employer], contracting:  Benefits:  Phone #:      Name:  Eff. Date: Part A 10/28/12, Part B 04/29/18     Deduct: $1408      Out of Pocket Max: n/a      Life Max: n/a CIR: 100%      SNF: 20 full days Outpatient: 80%     Co-Pay: 20% Home Health: 100%      Co-Pay:  DME: 80%     Co-Pay: 20% Providers: pt choice   SECONDARY:       Policy#:       Subscriber:  CM Name:       Phone#:      Fax#:  Pre-Cert#:       Employer:  Benefits:  Phone #:      Name:  Eff. Date:      Deduct:       Out of Pocket Max:       Life Max:  CIR:       SNF:  Outpatient:      Co-Pay:  Home Health:       Co-Pay:  DME:      Co-Pay:   Medicaid Application Date:       Case Manager:  Disability Application Date:       Case Worker:   The "Data Collection Information Summary" for patients in Inpatient Rehabilitation Facilities with attached "Privacy Act [place] Records" was provided and verbally reviewed with: Patient  Emergency Contact Information         Contact Information    Name Relation Home Work Mobile   [name],Joseph Son [phone]     Francisco Capuchin   [phone]       Current Medical History  Patient Admitting Diagnosis: R BKA  History of Present Illness: Joseph [name] a 73 year old right-handed male with history of type 2 diabetes mellitus, hypertension, CKD stage II. Presented 11/13/2018 with right foot ischemic changes with nausea. Patient reportedly had scraped his foot approximately 2 months ago while doing some tree work. X-rays revealed soft tissue ulcer about the fifth metatarsophagealjoint with soft tissue air. No osteomyelitis. MRI of the foot completed again gas in the fifth metatarsal head consistent with osteomyelitis. Follow-up orthopedic service Dr. Sharol Given initially with conservative care. Hospital course complicated by anemia hemoglobin 6.7 with gastroenterology consulted 11/14/2018 recommendations of colonoscopy of which patient refused and maintained on PPI with recommendation to transfuse if symptomatic. 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 Sharol Given. Hospital course pain management as well as leukocytosis 30,500 maintained on antibiotic therapy. Patient developed atrial fibrillation with runs of SVT cardiology services consulted 11/15/2018 and he did receive IV Lopressor and Cardizem with little response with IV amiodarone started and patient converted to sinus rhythm on the evening of 11/15/2018. Plan was to begin aspirin on discharge. Echocardiogram with ejection fraction of 55% moderate hypokinesis of the left ventricular, entire anterior septal wall. Patient placed on amiodarone 200 mg twice daily cardiac rate controlled. Leukocytosis improved 16,000 antibiotic therapy has been discontinued. Hemoglobin continues to stabilize 8.5.     Patient's medical record from [facility] has been reviewed by the rehabilitation admission coordinator and physician.  Past Medical History      Past Medical History:  Diagnosis Date  . Diabetes  mellitus type 2 in nonobese (HCC)   . Hypertension     Family History   family history includes Diabetes in his father.  Prior Rehab/Hospitalizations Has the patient had prior rehab or hospitalizations prior to admission? Has had several visits to ED for wound of R foot, but no admissions until this visit  Has the patient had major surgery during 100 days prior to admission? Yes             Current Medications  Current Facility-Administered Medications:  .  0.9 %  sodium chloride infusion (Manually program via Guardrails IV Fluids), , Intravenous, Once, Karleen Hampshire, Vijaya, MD .  0.9 %  sodium chloride infusion, , Intravenous, Continuous, Karleen Hampshire, Vijaya, MD, Last Rate: 10 mL/hr at 11/16/18 1739 .  acetaminophen (TYLENOL) tablet 325-650 mg, 325-650 mg, Oral, Q6H PRN, Hosie Poisson, MD, 650 mg at 11/17/18 1615 .  amiodarone (PACERONE) tablet 200 mg, 200 mg, Oral, BID, Hosie Poisson, MD, 200 mg at 11/20/18 0912 .  bisacodyl (DULCOLAX) suppository 10 mg, 10 mg, Rectal, Daily PRN, Hosie Poisson, MD .  docusate sodium (COLACE) capsule 100 mg, 100 mg, Oral, BID, Hosie Poisson, MD, 100 mg at 11/20/18 0913 .  furosemide (LASIX) injection 40 mg, 40 mg, Intravenous, Once, Darreld Mclean, PA-C .  [START ON 11/21/2018] furosemide (LASIX) tablet 40 mg, 40 mg, Oral, Daily, Sarajane Jews, Callie E, PA-C .  gabapentin (NEURONTIN) capsule 300 mg, 300 mg, Oral, TID, Hosie Poisson, MD, 300 mg at 11/20/18 0913 .  glipiZIDE (GLUCOTROL) tablet 5 mg, 5 mg, Oral, QAC breakfast, Hosie Poisson, MD, 5 mg at 11/20/18 0913 .  HYDROmorphone (DILAUDID) injection 0.5 mg, 0.5 mg, Intravenous, Q4H PRN, Hosie Poisson, MD .  HYDROmorphone (DILAUDID) injection 0.5-1 mg, 0.5-1 mg, Intravenous, Q4H PRN, Karleen Hampshire, Vijaya, MD .  insulin aspart (novoLOG) injection 0-15 Units, 0-15 Units, Subcutaneous, TID WC, Hosie Poisson, MD, 3 Units at 11/20/18 0830 .  insulin aspart (novoLOG) injection 0-5 Units, 0-5 Units, Subcutaneous, QHS, Hosie Poisson, MD, 2 Units at 11/15/18 2155 .  insulin aspart (novoLOG) injection 3 Units, 3 Units, Subcutaneous, TID WC, Hosie Poisson, MD, 3 Units at 11/20/18 0830 .  insulin detemir (LEVEMIR) injection 8 Units, 8 Units, Subcutaneous, QHS, Hosie Poisson, MD, 8 Units at 11/19/18 2109 .  magnesium citrate solution 1 Bottle, 1 Bottle, Oral, Once PRN, Hosie Poisson, MD .  methocarbamol (ROBAXIN) tablet 500 mg, 500 mg, Oral, Q6H PRN, 500 mg at 11/16/18 1253 **OR** methocarbamol (ROBAXIN) 500 mg in dextrose 5 % 50 mL IVPB, 500 mg, Intravenous, Q6H PRN, Hosie Poisson, MD .  metoCLOPramide (REGLAN) tablet 5-10 mg, 5-10 mg, Oral, Q8H PRN **OR** metoCLOPramide (REGLAN) injection 5-10 mg, 5-10 mg, Intravenous, Q8H PRN, Karleen Hampshire, Vijaya, MD .  ondansetron (ZOFRAN) tablet 4 mg, 4 mg, Oral, Q6H PRN **OR** ondansetron (ZOFRAN) injection 4 mg, 4 mg, Intravenous, Q6H PRN, Hosie Poisson, MD .  oxyCODONE (Oxy IR/ROXICODONE) immediate release tablet 10-15 mg, 10-15 mg, Oral, Q4H PRN, Hosie Poisson, MD, 15 mg at 11/19/18 2306 .  oxyCODONE (Oxy IR/ROXICODONE) immediate release tablet 5-10 mg, 5-10 mg, Oral, Q4H PRN, Hosie Poisson, MD, 10 mg at 11/19/18 1800 .  pantoprazole (PROTONIX) EC tablet 40 mg, 40 mg, Oral, BID, Hosie Poisson, MD, 40 mg at 11/20/18 0913 .  polyethylene glycol (MIRALAX / GLYCOLAX) packet 17 g, 17 g, Oral, Daily PRN, Hosie Poisson, MD .  Derrill Memo ON 11/21/2018] potassium chloride SA (K-DUR) CR tablet 20 mEq, 20 mEq, Oral, Daily, Sarajane Jews, Callie E, PA-C .  sodium chloride flush (NS) 0.9 % injection 3 mL, 3 mL, Intravenous, Q12H, Hosie Poisson, MD, 3 mL at 11/19/18 2110  Patients Current Diet:     Diet Order                  Diet Carb Modified Fluid consistency: Thin; Room service appropriate? Yes  Diet effective now               Precautions / Restrictions Precautions Precautions: Fall Precaution Comments: wear R leg brace at all times Other Brace: residual limb splint Restrictions Weight  Bearing Restrictions: Yes RLE Weight Bearing: Non weight bearing   Has the patient had 2 or more falls or a fall with injury in the past year? Yes  Prior Activity Level Community (5-7x/wk): was active in his community, still driving PTA  Prior Functional Level Self Care: Did the patient need help bathing, dressing, using the toilet or eating? Independent  Indoor Mobility: Did the patient need assistance with walking from room to room (with or without device)? Independent  Stairs: Did the patient need assistance with internal or external stairs (with or without device)? Independent  Functional Cognition: Did the patient need help planning regular tasks such as shopping or remembering to take medications? [place] Devices/Equipment: Cane (specify quad or straight), CBG Meter, Eyeglasses Home Equipment: Cane - single point  Prior Device Use: Indicate devices/aids used by the patient prior to current illness, exacerbation or injury? None of the above  Current Functional Level Cognition  Overall Cognitive Status: Within Functional Limits for tasks assessed Orientation Level: Oriented X4 General Comments: occasionally tangential    Extremity Assessment (includes Sensation/Coordination)  Upper Extremity Assessment: Overall WFL for tasks assessed  Lower Extremity Assessment: RLE deficits/detail RLE Deficits / Details: new BKA RLE: Unable to fully assess due to pain RLE Coordination: decreased gross motor    ADLs  Overall ADL's : Needs assistance/impaired Eating/Feeding: Independent, Sitting Grooming: Set up, Sitting Upper Body Bathing: Set up, Sitting Lower Body Bathing: Maximal assistance, With adaptive equipment, Sitting/lateral leans, Sit to/from stand Upper Body Dressing : Set up, Sitting Lower Body Dressing: Maximal assistance, Sitting/lateral leans, Sit to/from stand, With adaptive equipment Toilet Transfer:  Maximal assistance, Squat-pivot, BSC, RW Toileting- Clothing Manipulation and Hygiene: Minimal assistance, Sitting/lateral lean Tub/ Shower Transfer: Maximal assistance, Shower seat, Squat-pivot, Ambulation Functional mobility during ADLs: Maximal assistance, Rolling walker General ADL Comments: currently a max A for transfers due to unfamiliarity with DME and balance compromises    Mobility  Overal bed mobility: Needs Assistance Bed  Mobility: Supine to Sit, Sit to Supine Supine to sit: Min assist Sit to supine: Mod assist General bed mobility comments: assist for bilat LEs, increased time    Transfers  Overall transfer level: Needs assistance Equipment used: Rolling walker (2 wheeled) Transfers: Lateral/Scoot Transfers Sit to Stand: Max assist, From elevated surface  Lateral/Scoot Transfers: Min assist General transfer comment: pt able to scoot from bed to recliner chair wtih min A for LE placement and forward wt shift, cues for UE placement    Ambulation / Gait / Stairs / Wheelchair Mobility  Ambulation/Gait General Gait Details: unable    Posture / Balance Balance Overall balance assessment: Needs assistance Sitting-balance support: Single extremity supported, Bilateral upper extremity supported Sitting balance-Leahy Scale: Fair Standing balance support: Bilateral upper extremity supported, During functional activity Standing balance-Leahy Scale: Zero    Special needs/care consideration BiPAP/CPAP no CPM no Continuous Drip IV no Dialysis nno        Days n/a Life Vest no Oxygen no Special Bed no Trach Size no Wound Vac (area) yes      Location R residual limb Skin intact, incision to R amputation site                  Bowel mgmt: continent, last BM 6/20 Bladder mgmt: continent Diabetic mgmt: yes Behavioral consideration no Chemo/radiation no   Previous Home Environment (from acute therapy documentation) Living Arrangements: Alone Available Help at  Discharge: Family, Available PRN/intermittently, Friend(s) Type of Home: House Home Layout: Other (Comment)(split level home) Home Access: Stairs to enter Entrance Stairs-Rails: Right, Left Entrance Stairs-Number of Steps: 5+1+1 Bathroom Toilet: Standard Home Care Services: No Additional Comments: was out driving and independent with no AD prior to this  Discharge Living Setting Plans for Discharge Living Setting: Patient's home Type of Home at Discharge: House Discharge Home Layout: One level Discharge Home Access: Stairs to enter Entrance Stairs-Rails: None Entrance Stairs-Number of Steps: 1(through garage); Sharyn Lull has already arranged to have a ramp installed Discharge Bathroom Shower/Tub: Tub/shower unit Discharge Bathroom Toilet: Standard Discharge Bathroom Accessibility: Yes How Accessible: Accessible via walker Does the patient have any problems obtaining your medications?: No  Social/Family/Support Systems Patient Roles: Parent Anticipated Caregiver: Ex-wife Sharyn Lull and adult children Anticipated Caregiver's Contact Information: Sharyn Lull [phone] Ability/Limitations of Caregiver: plan to come to [place] with her children to care for pt Caregiver Availability: 24/7 Discharge Plan Discussed with Primary Caregiver: Yes Is Caregiver In Agreement with Plan?: Yes Does Caregiver/Family have Issues with Lodging/Transportation while Pt is in Rehab?: No  Goals/Additional Needs Patient/Family Goal for Rehab: PT/OT supervision/mod I Expected length of stay: 14-18 days Dietary Needs: carb modified/thin Pt/Family Agrees to Admission and willing to participate: Yes Program Orientation Provided & Reviewed with Pt/Caregiver Including Roles  & Responsibilities: Yes  Decrease burden of Care through IP rehab admission: n/a  Possible need for SNF placement upon discharge: not anticipated  Patient Condition: I have reviewed medical records from [facility], spoken with  CM, and patient and family member. I met with patient at the bedside and discussed with ex-wife via phone for inpatient rehabilitation assessment.  Patient will benefit from ongoing PT and OT, can actively participate in 3 hours of therapy a day 5 days of the week, and can make measurable gains during the admission.  Patient will also benefit from the coordinated team approach during an Inpatient Acute Rehabilitation admission.  The patient will receive intensive therapy as well as Rehabilitation physician, nursing, social worker, and care management interventions.  Due to safety, skin/wound care, disease management, medication administration, pain management and patient education the patient requires 24 hour a day rehabilitation nursing.  The patient is currently mod/max with mobility and basic ADLs.  Discharge setting and therapy post discharge at home with home health is anticipated.  Patient has agreed to participate in the Acute Inpatient Rehabilitation Program and will admit today.  Preadmission Screen Completed By:  Michel Santee, PT, DPT 11/20/2018 12:02 PM ______________________________________________________________________   Discussed status with Dr. Naaman Plummer on 11/20/18  at 12:02 PM  and received approval for admission today.  Admission Coordinator:  Michel Santee, PT, DPT 12:02 PM Sudie Grumbling 11/20/18    Assessment/Plan: Diagnosis: right BKA 1. Does the need for close, 24 hr/day Medical supervision in concert with the patient's rehab needs make it unreasonable for this patient to be served in a less intensive setting? Yes 2. Co-Morbidities requiring supervision/potential complications: DM, ckd, htn 3. Due to bladder management, bowel management, safety, skin/wound care, disease management, medication administration, pain management and patient education, does the patient require 24 hr/day rehab nursing? Yes 4. Does the patient require coordinated care of a physician, rehab nurse, PT (1-2  hrs/day, 5 days/week) and OT (1-2 hrs/day, 5 days/week) to address physical and functional deficits in the context of the above medical diagnosis(es)? Yes Addressing deficits in the following areas: balance, endurance, locomotion, strength, transferring, bowel/bladder control, bathing, dressing, feeding, grooming, toileting and psychosocial support 5. Can the patient actively participate in an intensive therapy program of at least 3 hrs of therapy 5 days a week? Yes 6. The potential for patient to make measurable gains while on inpatient rehab is excellent 7. Anticipated functional outcomes upon discharge from inpatients are: modified independent and supervision PT, modified independent and supervision OT, n/a SLP 8. Estimated rehab length of stay to reach the above functional goals is: 14-18 days 9. Anticipated D/C setting: Home 10. Anticipated post D/C treatments: [place] therapy 11. Overall Rehab/Functional Prognosis: excellent  MD Signature: Meredith Staggers, MD, [facility] 11/20/2018         Revision History

## 2018-11-20 NOTE — Progress Notes (Signed)
Pt admitted to room 4M05. Oriented to room, floor, and no fall policy. C/o pain on left leg, waiting for pharmacy to verify orders. Otherwise stable. Continue plan of care.   Gerald Stabs, RN

## 2018-11-20 NOTE — Progress Notes (Addendum)
Progress Note  Patient Name: MontenegroDenmark Mcdill Date of Encounter: 11/20/2018  Primary Cardiologist: Dietrich PatesPaula Ross, MD   Subjective   No significant overnight events. Patient has no complaints this morning. He denies any chest pain, shortness of breath, palpitations, lightheadedness, or dizziness.   Inpatient Medications    Scheduled Meds: . sodium chloride   Intravenous Once  . amiodarone  200 mg Oral BID  . docusate sodium  100 mg Oral BID  . gabapentin  300 mg Oral TID  . glipiZIDE  5 mg Oral QAC breakfast  . insulin aspart  0-15 Units Subcutaneous TID WC  . insulin aspart  0-5 Units Subcutaneous QHS  . insulin aspart  3 Units Subcutaneous TID WC  . insulin detemir  8 Units Subcutaneous QHS  . pantoprazole  40 mg Oral BID  . sodium chloride flush  3 mL Intravenous Q12H   Continuous Infusions: . sodium chloride 10 mL/hr at 11/16/18 1739  . methocarbamol (ROBAXIN) IV     PRN Meds: acetaminophen, bisacodyl, HYDROmorphone (DILAUDID) injection, HYDROmorphone (DILAUDID) injection, magnesium citrate, methocarbamol **OR** methocarbamol (ROBAXIN) IV, metoCLOPramide **OR** metoCLOPramide (REGLAN) injection, ondansetron **OR** ondansetron (ZOFRAN) IV, oxyCODONE, oxyCODONE, polyethylene glycol   Vital Signs    Vitals:   11/19/18 0530 11/19/18 1519 11/19/18 2252 11/20/18 0349  BP: 107/83 132/76 (!) 104/58 107/75  Pulse: 89 80 75 78  Resp: 16     Temp: 98.6 F (37 C) 98.7 F (37.1 C) 98 F (36.7 C) 98.8 F (37.1 C)  TempSrc: Oral Oral Oral Oral  SpO2: 99% 100% 96% 98%  Weight: 96.7 kg   92.3 kg  Height:        Intake/Output Summary (Last 24 hours) at 11/20/2018 0948 Last data filed at 11/20/2018 0423 Gross per 24 hour  Intake 840 ml  Output 600 ml  Net 240 ml   Filed Weights   11/18/18 0509 11/19/18 0530 11/20/18 0349  Weight: 96.8 kg 96.7 kg 92.3 kg    Telemetry    Normal sinus rhythm with baseline rates in the 60's to 80's. One tachycardic episode that looks like atrial  fibrillation yesterday with rates in the 130's to 140's. - Personally Reviewed  ECG    No new ECG tracings today. - Personally Reviewed  Physical Exam   GEN: 73 year old  male resting comfortably. Alert and in no acute distress.   Neck: Supple.  Cardiac: RRR. II/VI systolic murmur noted. No gallops or rubs.  Respiratory: Clear to auscultation bilaterally. No wheezes, rhonchi, or rales. GI: Abdomen soft, non-distended, and non-tender. Bowel sounds present. Extremities: 2+ pitting edema of left lower extremity. S/p right below knee amputation.  Skin: Warm and dry. Neuro:  No focal deficits. Psych: Normal affect. Responds appropriately.  Labs    Chemistry Recent Labs  Lab 11/13/18 1832  11/15/18 0458  11/17/18 1646 11/18/18 0859 11/19/18 0457  NA 130*   < > 142   < > 140 135 134*  K 4.1   < > 4.1   < > 3.9 3.9 3.9  CL 97*   < > 112*   < > 107 102 100  CO2 14*   < > 20*   < > 21* 23 27  GLUCOSE 271*   < > 206*   < > 62* 160* 115*  BUN 76*   < > 40*   < > 17 15 16   CREATININE 3.15*   < > 1.38*   < > 1.28* 1.11 1.21  CALCIUM 9.0   < >  8.7*   < > 8.4* 8.5* 8.3*  PROT 7.7  --  6.9  --   --   --   --   ALBUMIN 2.6*  --  2.2*  --   --   --   --   AST 32  --  21  --   --   --   --   ALT 22  --  20  --   --   --   --   ALKPHOS 78  --  67  --   --   --   --   BILITOT 0.6  --  0.5  --   --   --   --   GFRNONAA 19*   < > 51*   < > 56* >60 60*  GFRAA 22*   < > 59*   < > >60 >60 >60  ANIONGAP 19*   < > 10   < > 12 10 7    < > = values in this interval not displayed.     Hematology Recent Labs  Lab 11/17/18 1646 11/18/18 0859 11/19/18 0457  WBC 12.2* 19.3* 16.2*  RBC 3.00* 2.90* 2.86*  HGB 9.0* 8.6* 8.5*  HCT 27.2* 26.7* 26.4*  MCV 90.7 92.1 92.3  MCH 30.0 29.7 29.7  MCHC 33.1 32.2 32.2  RDW 13.8 13.6 13.5  PLT 68* PLATELET CLUMPS NOTED ON SMEAR, COUNT APPEARS ADEQUATE 373    Cardiac Enzymes Recent Labs  Lab 11/15/18 1411 11/15/18 1930 11/16/18 0145  TROPONINI  0.03* 0.03* 0.03*   No results for input(s): TROPIPOC in the last 168 hours.   BNPNo results for input(s): BNP, PROBNP in the last 168 hours.   DDimer No results for input(s): DDIMER in the last 168 hours.   Radiology    No results found.  Cardiac Studies   Echocardiogram 11/16/2018: Impressions: 1. Moderate hypokinesis of the left ventricular, entire anteroseptal wall.  2. The left ventricle has low normal systolic function, with an ejection fraction of 50-55%. The cavity size was normal. Left ventricular diastolic Doppler parameters are consistent with pseudonormalization.  3. The right ventricle has normal systolic function. The cavity was normal. There is no increase in right ventricular wall thickness. Right ventricular systolic pressure is mildly elevated.  4. Left atrial size was mildly dilated.  5. The mitral valve is grossly normal. Mild thickening of the mitral valve leaflet. Mild calcification of the mitral valve leaflet. There is moderate mitral annular calcification present.  6. Tricuspid valve regurgitation is moderate-severe.  7. The aortic valve is tricuspid. Moderate thickening of the aortic valve. Moderate calcification of the aortic valve. Aortic valve regurgitation is trivial by color flow Doppler. Mild stenosis of the aortic valve.  8. RCC appears fixed but LCC/NCC open well.  9. The inferior vena cava was dilated in size with <50% respiratory variability.  Summary: Low normal LV EF. On parasternal views, the anteroseptal wall appears mildly hypokinetic, but on short axis views it is not impressive. 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  Patient Profile   Mr. Paolucci is a 73 y.o. male with a history of hypertension, diabetes mellitus, and CKD stage II who presented on 11/13/2018 for evaluation of a progressive right grangrenous foot wound for the last 2 months. 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 Cardiology was consulted on 11/15/2018 for evaluation of new onset atrial firbillation  Assessment & Plan    New Onset Atrial Fibrillation  - Patient did not respond to beta-blocker or Diltiazem and BP was soft so IV Amiodarone was started and patient converted to sinus rhythm on the evening of 6/18. - Telemetry shows normal sinus rhythm with baseline rates in the 60's to 80's. One tachycardic episode that looks like atrial fibrillation yesterday with rates in the 130's to 140's. - Echo this admission showed LVEF of 50-55% and diastolic dysfunction. There was also moderate hypokinesis of the anterior LV wall. - Continue PO Amiodarone 200mg  twice daily. Plan to reduce to 200mg  daily after 2 weeks. - CHA2DS2-VASc = at least 4 (HTN, DM, PAD, age). However, patient had severe anemia during this admission and it is felt that he may have a GI ulcer. Patient apparently declined an in-patient GI evaluation. Therefore, no anticoagulation has been started. Per Dr. Eldridge DaceVaranasi, "will need Gi status ascertained before starting anticoagulation." - Per Dr. Rennis GoldenHilty, patient would benefit from ischemic testing. Consider outpatient Lexiscan once he recovers from surgery.   PAD - Patient s/p right below knee amputation on 11/16/2018. - Per primary team, plan is to start Aspirin and statin on discharge.  Chronic Diastolic CHF - Echo this admission showed LVEF of 50-55% and diastolic dysfunction. There was also moderate hypokinesis of the anterior LV wall. - Patient has 2-3+ pitting left lower extremity edema.  - Last dose of IV Lasix was 6/21/ Consider additional dose of IV Lasix 40mg  today.   Hypertension - BP currently well controlled. Most recent BP 107/75.  Diabetes Mellitus - Most recent hemoglobin A1c of 5.7 in 09/2018. - Being seen by diabetes coordinator. - Management per primary team.  Anemia - Hemoglobin 9.7 on admission and dropped to 6.7 on 6/17. Patient  received 1 unit of PRBCs. Hemoglobin has been in the 8.5 to 9.5 range since that time. Hemoglobin  8.5 yesterday. - GI consulted and feel that patient would benefit form EGD and colonoscopy, likely as an outpatient.  - Continue supportive care with PPI, avoidance of NSAIDS, and transfusion PRN.  Acute on CKD Stage II - Serum creatinine 3.15 on admission. Improved with IV fluids. 1.21 yesterday. Today's BMET pending. - Continue to avoid Nephrotoxins. - Daily BMET.  For questions or updates, please contact [facility] Please consult [email or web address] for contact info under Cardiology/STEMI.      SignedCorrin Parker, Callie E Goodrich, PA-C  11/20/2018, 9:48 AM   Pager: [phone]  I have examined the patient and reviewed assessment and plan and discussed with patient.  Agree with above as stated.    Doing well. In NSR.  Continue Amio.  No anticoag at this time due to GI bleeding.   Would just plan aggressive preventive therapy for CAD since he has RF for CAD, but is asymptomatic from a cardiac standpoint.  CRI - improved, but increases risks of invasive coronary eval.     Lance MussJayadeep Minh Jasper

## 2018-11-20 NOTE — PMR Pre-admission (Signed)
PMR Admission Coordinator Pre-Admission Assessment  Patient: Joseph [name] is an 73 y.o., male MRN: [id number] DOB: 04-07-1946 Height: 6' 4"  (193 cm) Weight: 92.3 kg  Insurance Information HMO:     PPO:      PCP:      IPA:      80/20:      OTHER:  PRIMARY: Medicare Part A and B      Policy#: [id number]      Subscriber: patient CM Name:       Phone#:      Fax#:  Pre-Cert#: verified online via Civil engineer, contracting:  Benefits:  Phone #:      Name:  Eff. Date: Part A 10/28/12, Part B 04/29/18     Deduct: $1408      Out of Pocket Max: n/a      Life Max: n/a CIR: 100%      SNF: 20 full days Outpatient: 80%     Co-Pay: 20% Home Health: 100%      Co-Pay:  DME: 80%     Co-Pay: 20% Providers: pt choice   SECONDARY:       Policy#:       Subscriber:  CM Name:       Phone#:      Fax#:  Pre-Cert#:       Employer:  Benefits:  Phone #:      Name:  Eff. Date:      Deduct:       Out of Pocket Max:       Life Max:  CIR:       SNF:  Outpatient:      Co-Pay:  Home Health:       Co-Pay:  DME:      Co-Pay:   Medicaid Application Date:       Case Manager:  Disability Application Date:       Case Worker:   The "Data Collection Information Summary" for patients in Inpatient Rehabilitation Facilities with attached "Privacy Act Placer Records" was provided and verbally reviewed with: Patient  Emergency Contact Information Contact Information    Name Relation Home Work Mobile   Quale,Kaelin Son [phone]     Francisco Capuchin   [phone]      Current Medical History  Patient Admitting Diagnosis: R BKA  History of Present Illness: Joseph [name] is a 73 year old right-handed male with history of type 2 diabetes mellitus, hypertension, CKD stage II. Presented 11/13/2018 with right foot ischemic changes with nausea.  Patient reportedly had scraped his foot approximately 2 months ago while doing some tree work.  X-rays revealed soft tissue ulcer about the fifth  metatarsophageal joint with soft tissue air.  No osteomyelitis.  MRI of the foot completed again gas in the fifth metatarsal head consistent with osteomyelitis.  Follow-up orthopedic service Dr. Sharol Given initially with conservative care.  Hospital course complicated by anemia hemoglobin 6.7 with gastroenterology consulted 11/14/2018 recommendations of colonoscopy of which patient refused and maintained on PPI with recommendation to transfuse if symptomatic.  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 Sharol Given.  Hospital course pain management as well as leukocytosis 30,500 maintained on antibiotic therapy.  Patient developed atrial fibrillation with runs of SVT cardiology services consulted 11/15/2018 and he did receive IV Lopressor and Cardizem with little response with IV amiodarone started and patient converted to sinus rhythm on the evening of 11/15/2018.  Plan was to begin aspirin on discharge.  Echocardiogram with ejection fraction of 55% moderate hypokinesis of the left ventricular, entire anterior septal wall.  Patient placed on amiodarone 200 mg twice daily cardiac rate controlled.  Leukocytosis improved 16,000 antibiotic therapy has been discontinued.  Hemoglobin continues to stabilize 8.5.     Patient's medical record from [facility] has been reviewed by the rehabilitation admission coordinator and physician.  Past Medical History  Past Medical History:  Diagnosis Date  . Diabetes mellitus type 2 in nonobese (HCC)   . Hypertension     Family History   family history includes Diabetes in his father.  Prior Rehab/Hospitalizations Has the patient had prior rehab or hospitalizations prior to admission? Has had several visits to ED for wound of R foot, but no admissions until this visit  Has the patient had major surgery during 100 days prior to admission? Yes   Current Medications  Current  Facility-Administered Medications:  .  0.9 %  sodium chloride infusion (Manually program via Guardrails IV Fluids), , Intravenous, Once, Karleen Hampshire, Vijaya, MD .  0.9 %  sodium chloride infusion, , Intravenous, Continuous, Karleen Hampshire, Vijaya, MD, Last Rate: 10 mL/hr at 11/16/18 1739 .  acetaminophen (TYLENOL) tablet 325-650 mg, 325-650 mg, Oral, Q6H PRN, Hosie Poisson, MD, 650 mg at 11/17/18 1615 .  amiodarone (PACERONE) tablet 200 mg, 200 mg, Oral, BID, Hosie Poisson, MD, 200 mg at 11/20/18 0912 .  bisacodyl (DULCOLAX) suppository 10 mg, 10 mg, Rectal, Daily PRN, Hosie Poisson, MD .  docusate sodium (COLACE) capsule 100 mg, 100 mg, Oral, BID, Hosie Poisson, MD, 100 mg at 11/20/18 0913 .  furosemide (LASIX) injection 40 mg, 40 mg, Intravenous, Once, Darreld Mclean, PA-C .  [START ON 11/21/2018] furosemide (LASIX) tablet 40 mg, 40 mg, Oral, Daily, Sarajane Jews, Callie E, PA-C .  gabapentin (NEURONTIN) capsule 300 mg, 300 mg, Oral, TID, Hosie Poisson, MD, 300 mg at 11/20/18 0913 .  glipiZIDE (GLUCOTROL) tablet 5 mg, 5 mg, Oral, QAC breakfast, Hosie Poisson, MD, 5 mg at 11/20/18 0913 .  HYDROmorphone (DILAUDID) injection 0.5 mg, 0.5 mg, Intravenous, Q4H PRN, Hosie Poisson, MD .  HYDROmorphone (DILAUDID) injection 0.5-1 mg, 0.5-1 mg, Intravenous, Q4H PRN, Karleen Hampshire, Vijaya, MD .  insulin aspart (novoLOG) injection 0-15 Units, 0-15 Units, Subcutaneous, TID WC, Hosie Poisson, MD, 3 Units at 11/20/18 0830 .  insulin aspart (novoLOG) injection 0-5 Units, 0-5 Units, Subcutaneous, QHS, Hosie Poisson, MD, 2 Units at 11/15/18 2155 .  insulin aspart (novoLOG) injection 3 Units, 3 Units, Subcutaneous, TID WC, Hosie Poisson, MD, 3 Units at 11/20/18 0830 .  insulin detemir (LEVEMIR) injection 8 Units, 8 Units, Subcutaneous, QHS, Hosie Poisson, MD, 8 Units at 11/19/18 2109 .  magnesium citrate solution 1 Bottle, 1 Bottle, Oral, Once PRN, Hosie Poisson, MD .  methocarbamol (ROBAXIN) tablet 500 mg, 500 mg, Oral, Q6H PRN, 500 mg at  11/16/18 1253 **OR** methocarbamol (ROBAXIN) 500 mg in dextrose 5 % 50 mL IVPB, 500 mg, Intravenous, Q6H PRN, Hosie Poisson, MD .  metoCLOPramide (REGLAN) tablet 5-10 mg, 5-10 mg, Oral, Q8H PRN **OR** metoCLOPramide (REGLAN) injection 5-10 mg, 5-10 mg, Intravenous, Q8H PRN, Karleen Hampshire, Vijaya, MD .  ondansetron (ZOFRAN) tablet 4 mg, 4 mg, Oral, Q6H PRN **OR** ondansetron (ZOFRAN) injection 4 mg, 4 mg, Intravenous, Q6H PRN, Hosie Poisson, MD .  oxyCODONE (Oxy IR/ROXICODONE) immediate release tablet 10-15 mg, 10-15 mg, Oral, Q4H PRN, Hosie Poisson, MD, 15 mg at 11/19/18 2306 .  oxyCODONE (Oxy IR/ROXICODONE) immediate release  tablet 5-10 mg, 5-10 mg, Oral, Q4H PRN, Hosie Poisson, MD, 10 mg at 11/19/18 1800 .  pantoprazole (PROTONIX) EC tablet 40 mg, 40 mg, Oral, BID, Hosie Poisson, MD, 40 mg at 11/20/18 0913 .  polyethylene glycol (MIRALAX / GLYCOLAX) packet 17 g, 17 g, Oral, Daily PRN, Hosie Poisson, MD .  Derrill Memo ON 11/21/2018] potassium chloride SA (K-DUR) CR tablet 20 mEq, 20 mEq, Oral, Daily, Sarajane Jews, Callie E, PA-C .  sodium chloride flush (NS) 0.9 % injection 3 mL, 3 mL, Intravenous, Q12H, Hosie Poisson, MD, 3 mL at 11/19/18 2110  Patients Current Diet:  Diet Order            Diet Carb Modified Fluid consistency: Thin; Room service appropriate? Yes  Diet effective now              Precautions / Restrictions Precautions Precautions: Fall Precaution Comments: wear R leg brace at all times Other Brace: residual limb splint Restrictions Weight Bearing Restrictions: Yes RLE Weight Bearing: Non weight bearing   Has the patient had 2 or more falls or a fall with injury in the past year? Yes  Prior Activity Level Community (5-7x/wk): was active in his community, still driving PTA  Prior Functional Level Self Care: Did the patient need help bathing, dressing, using the toilet or eating? Independent  Indoor Mobility: Did the patient need assistance with walking from room to room (with or  without device)? Independent  Stairs: Did the patient need assistance with internal or external stairs (with or without device)? Independent  Functional Cognition: Did the patient need help planning regular tasks such as shopping or remembering to take medications? [place] Devices/Equipment: Cane (specify quad or straight), CBG Meter, Eyeglasses Home Equipment: Cane - single point  Prior Device Use: Indicate devices/aids used by the patient prior to current illness, exacerbation or injury? None of the above  Current Functional Level Cognition  Overall Cognitive Status: Within Functional Limits for tasks assessed Orientation Level: Oriented X4 General Comments: occasionally tangential    Extremity Assessment (includes Sensation/Coordination)  Upper Extremity Assessment: Overall WFL for tasks assessed  Lower Extremity Assessment: RLE deficits/detail RLE Deficits / Details: new BKA RLE: Unable to fully assess due to pain RLE Coordination: decreased gross motor    ADLs  Overall ADL's : Needs assistance/impaired Eating/Feeding: Independent, Sitting Grooming: Set up, Sitting Upper Body Bathing: Set up, Sitting Lower Body Bathing: Maximal assistance, With adaptive equipment, Sitting/lateral leans, Sit to/from stand Upper Body Dressing : Set up, Sitting Lower Body Dressing: Maximal assistance, Sitting/lateral leans, Sit to/from stand, With adaptive equipment Toilet Transfer: Maximal assistance, Squat-pivot, BSC, RW Toileting- Clothing Manipulation and Hygiene: Minimal assistance, Sitting/lateral lean Tub/ Shower Transfer: Maximal assistance, Shower seat, Squat-pivot, Ambulation Functional mobility during ADLs: Maximal assistance, Rolling walker General ADL Comments: currently a max A for transfers due to unfamiliarity with DME and balance compromises    Mobility  Overal bed mobility: Needs Assistance Bed Mobility: Supine to Sit,  Sit to Supine Supine to sit: Min assist Sit to supine: Mod assist General bed mobility comments: assist for bilat LEs, increased time    Transfers  Overall transfer level: Needs assistance Equipment used: Rolling walker (2 wheeled) Transfers: Lateral/Scoot Transfers Sit to Stand: Max assist, From elevated surface  Lateral/Scoot Transfers: Min assist General transfer comment: pt able to scoot from bed to recliner chair wtih min A for LE placement and forward wt shift, cues for UE placement    Ambulation /  Gait / Stairs / Emergency planning/management officer  Ambulation/Gait General Gait Details: unable    Posture / Balance Balance Overall balance assessment: Needs assistance Sitting-balance support: Single extremity supported, Bilateral upper extremity supported Sitting balance-Leahy Scale: Fair Standing balance support: Bilateral upper extremity supported, During functional activity Standing balance-Leahy Scale: Zero    Special needs/care consideration BiPAP/CPAP no CPM no Continuous Drip IV no Dialysis nno        Days n/a Life Vest no Oxygen no Special Bed no Trach Size no Wound Vac (area) yes      Location R residual limb Skin intact, incision to R amputation site                  Bowel mgmt: continent, last BM 6/20 Bladder mgmt: continent Diabetic mgmt: yes Behavioral consideration no Chemo/radiation no   Previous Home Environment (from acute therapy documentation) Living Arrangements: Alone Available Help at Discharge: Family, Available PRN/intermittently, Friend(s) Type of Home: House Home Layout: Other (Comment)(split level home) Home Access: Stairs to enter Entrance Stairs-Rails: Right, Left Entrance Stairs-Number of Steps: 5+1+1 Bathroom Toilet: Standard Home Care Services: No Additional Comments: was out driving and independent with no AD prior to this  Discharge Living Setting Plans for Discharge Living Setting: Patient's home Type of Home at Discharge: House Discharge  Home Layout: One level Discharge Home Access: Stairs to enter Entrance Stairs-Rails: None Entrance Stairs-Number of Steps: 1(through garage); Sharyn Lull has already arranged to have a ramp installed Discharge Bathroom Shower/Tub: Tub/shower unit Discharge Bathroom Toilet: Standard Discharge Bathroom Accessibility: Yes How Accessible: Accessible via walker Does the patient have any problems obtaining your medications?: No  Social/Family/Support Systems Patient Roles: Parent Anticipated Caregiver: Ex-wife Sharyn Lull and adult children Anticipated Caregiver's Contact Information: Sharyn Lull [phone] Ability/Limitations of Caregiver: plan to come to [place] with her children to care for pt Caregiver Availability: 24/7 Discharge Plan Discussed with Primary Caregiver: Yes Is Caregiver In Agreement with Plan?: Yes Does Caregiver/Family have Issues with Lodging/Transportation while Pt is in Rehab?: No  Goals/Additional Needs Patient/Family Goal for Rehab: PT/OT supervision/mod I Expected length of stay: 14-18 days Dietary Needs: carb modified/thin Pt/Family Agrees to Admission and willing to participate: Yes Program Orientation Provided & Reviewed with Pt/Caregiver Including Roles  & Responsibilities: Yes  Decrease burden of Care through IP rehab admission: n/a  Possible need for SNF placement upon discharge: not anticipated  Patient Condition: I have reviewed medical records from [facility], spoken with CM, and patient and family member. I met with patient at the bedside and discussed with ex-wife via phone for inpatient rehabilitation assessment.  Patient will benefit from ongoing PT and OT, can actively participate in 3 hours of therapy a day 5 days of the week, and can make measurable gains during the admission.  Patient will also benefit from the coordinated team approach during an Inpatient Acute Rehabilitation admission.  The patient will receive intensive therapy as well as  Rehabilitation physician, nursing, social worker, and care management interventions.  Due to safety, skin/wound care, disease management, medication administration, pain management and patient education the patient requires 24 hour a day rehabilitation nursing.  The patient is currently mod/max with mobility and basic ADLs.  Discharge setting and therapy post discharge at home with home health is anticipated.  Patient has agreed to participate in the Acute Inpatient Rehabilitation Program and will admit today.  Preadmission Screen Completed By:  Michel Santee, PT, DPT 11/20/2018 12:02 PM ______________________________________________________________________   Discussed status with Dr. Naaman Plummer on 11/20/18  at 12:02 PM  and received approval for admission today.  Admission Coordinator:  Michel Santee, PT, DPT 12:02 PM Sudie Grumbling 11/20/18    Assessment/Plan: Diagnosis: right BKA 1. Does the need for close, 24 hr/day Medical supervision in concert with the patient's rehab needs make it unreasonable for this patient to be served in a less intensive setting? Yes 2. Co-Morbidities requiring supervision/potential complications: DM, ckd, htn 3. Due to bladder management, bowel management, safety, skin/wound care, disease management, medication administration, pain management and patient education, does the patient require 24 hr/day rehab nursing? Yes 4. Does the patient require coordinated care of a physician, rehab nurse, PT (1-2 hrs/day, 5 days/week) and OT (1-2 hrs/day, 5 days/week) to address physical and functional deficits in the context of the above medical diagnosis(es)? Yes Addressing deficits in the following areas: balance, endurance, locomotion, strength, transferring, bowel/bladder control, bathing, dressing, feeding, grooming, toileting and psychosocial support 5. Can the patient actively participate in an intensive therapy program of at least 3 hrs of therapy 5 days a week? Yes 6. The potential  for patient to make measurable gains while on inpatient rehab is excellent 7. Anticipated functional outcomes upon discharge from inpatients are: modified independent and supervision PT, modified independent and supervision OT, n/a SLP 8. Estimated rehab length of stay to reach the above functional goals is: 14-18 days 9. Anticipated D/C setting: Home 10. Anticipated post D/C treatments: [place] therapy 11. Overall Rehab/Functional Prognosis: excellent  MD Signature: Meredith Staggers, MD, [facility] 11/20/2018

## 2018-11-20 NOTE — Progress Notes (Signed)
Physical Therapy Treatment Patient Details Name: Joseph [name] MRN: [id number] DOB: 02/27/1946 Today's Date: 11/20/2018    History of Present Illness 73 yo male with onset of acute renal failure and sepsis from R foot laceration in his yard, that had become a cellulitis with an ulcer.  Pt received ABT from return to ED, with mult re-assessments that eventually led to BK amputation.  PMHx:  Sepsis, acute renal failure, DM, gangrene, PN, PVD, HTN, CKD, RLE ischemia,  a-fib,     PT Comments    Pt performs lateral scoot transfer with min A this session, improving UE strength noted. Pt is very motivated to improve and asks for extra exercises to do in his room. Pt given theraband for UE strengthening  Follow Up Recommendations  CIR     Equipment Recommendations       Recommendations for Other Services       Precautions / Restrictions Precautions Precautions: Fall Precaution Comments: wear R leg brace at all times Other Brace: residual limb splint Restrictions RLE Weight Bearing: Non weight bearing    Mobility  Bed Mobility Overal bed mobility: Needs Assistance       Supine to sit: Min assist     General bed mobility comments: assist for bilat LEs, increased time  Transfers     Transfers: Lateral/Scoot Transfers          Lateral/Scoot Transfers: Min assist General transfer comment: pt able to scoot from bed to recliner chair wtih min A for LE placement and forward wt shift, cues for UE placement  Ambulation/Gait                 Stairs             Wheelchair Mobility    Modified Rankin (Stroke Patients Only)       Balance                                            Cognition Arousal/Alertness: Awake/alert Behavior During Therapy: WFL for tasks assessed/performed Overall Cognitive Status: Within Functional Limits for tasks assessed                                        Exercises Amputee Exercises Quad  Sets: AROM;10 reps Towel Squeeze: AROM;10 reps Hip ABduction/ADduction: AROM;10 reps Straight Leg Raises: AROM;10 reps    General Comments        Pertinent Vitals/Pain Faces Pain Scale: Hurts little more Pain Location: R residual limb Pain Descriptors / Indicators: Aching;Sore Pain Intervention(s): Limited activity within patient's tolerance;Repositioned    Home Living                      Prior Function            PT Goals (current goals can now be found in the care plan section) Progress towards PT goals: Progressing toward goals    Frequency    Min 4X/week      PT Plan Current plan remains appropriate    Co-evaluation              AM-PAC PT "6 Clicks" Mobility   Outcome Measure  Help needed turning from your back to your side while in a flat bed without using bedrails?: A Little Help  needed moving from lying on your back to sitting on the side of a flat bed without using bedrails?: A Little Help needed moving to and from a bed to a chair (including a wheelchair)?: A Little Help needed standing up from a chair using your arms (e.g., wheelchair or bedside chair)?: A Lot Help needed to walk in hospital room?: Total Help needed climbing 3-5 steps with a railing? : Total 6 Click Score: 13    End of Session   Activity Tolerance: Patient tolerated treatment well Patient left: in chair;with call bell/phone within reach   PT Visit Diagnosis: Unsteadiness on feet (R26.81);Muscle weakness (generalized) (M62.81);Difficulty in walking, not elsewhere classified (R26.2);Pain Pain - Right/Left: Right Pain - part of body: Leg     Time: [id number] PT Time Calculation (min) (ACUTE ONLY): 28 min  Charges:  $Therapeutic Exercise: 8-22 mins $Therapeutic Activity: 8-22 mins                     Joseph [name], PT, DPT   Joseph [name] 11/20/2018, 10:10 AM

## 2018-11-20 NOTE — Discharge Summary (Signed)
Physician Discharge Summary  Joseph [name] ZOX:[id number] DOB: [birth year] DOA: 11/13/2018  PCP: Patient, No Pcp Per  Admit date: 11/13/2018 Discharge date: 11/20/2018  Admitted From: Home Disposition: CIR Recommendations for Outpatient Follow-up:  1. Follow up with PCP in 1-2 weeks 2. Please obtain BMP/CBC in one week 3. Please follow up on the following pending results:  Home Health none Equipment/Devices: None  Discharge Condition: Stable and improved CODE STATUS: Full code Diet recommendation: Cardiac Brief/Interim Summary: 73 y.o.malewith medical history significant fortype 2 diabetes, hypertension, CKD stage II who presents to the ED for evaluation of right foot wound that has been ongoing for the last 2 months.  Patient first noticed a wound to his right foot about 2 months ago. He noticed a small laceration to his right dorsal foot after doing yard work. He was seen in the ED initially on 09/12/2018 and treated with a 5-day course of doxycycline for cellulitis and superficial ulcer. He was seen again on 09/20/2018 in the ED and switched to oral clindamycin. He was seen for follow-up wound check in the ED on 09/23/2018 at which time it was felt his wound was improving. He was seen again on 10/08/2018 and the wound appeared to be healing with chronic necrotic appearance. He was given a prescription for oral Augmentin. He was seen by community health and wellness to establish care on 10/17/2018 during which time he felt his foot pain and wound was improving. He was given oral Augmentin and referred to the wound care clinic   Discharge Diagnoses:  Principal Problem:   Sepsis with acute renal failure (HCC) Active Problems:   DM (diabetes mellitus) (HCC)   Hypertension associated with diabetes (HCC)   Acute renal failure superimposed on stage 2 chronic kidney disease (HCC)   Open wound of right foot   Gangrene of right foot (HCC)   Diabetic polyneuropathy associated with type 2  diabetes mellitus (HCC)   PVD (peripheral vascular disease) (HCC)   Critical lower limb ischemia   Atrial fibrillation (HCC)   Cellulitis of right lower extremity  Cellulitis of right lower extremity  Sepsis secondary to right foot infection/ right foot gangrene D/c antibiotics  Orthopedics consulted and recommendations given. 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 Patient was followed by vascular surgery.  Follow-up with vascular surgery in 3 months after discharge.    Cultures remain negative   Acute on stage II CKD with metabolic acidosis Improvedwith fluid resuscitation. Hold nephrotoxins. Avoid NSAIDS  Peripheral vascular disease: Will start aspirin and statin on discharge.  Anemia of blood loss superimposed on anemia of chronic disease:  S/p 1 UNIT Of prbc transfusion.  Repeat H&H is between 8 to 9.  Keep H&H greater than 7.  GI consulted to see if EGD / colonoscopy can be done. Patient is refusing at this time. Patient wants to schedule the EGD and colonoscopy as outpatient.   New onset atrial fibrillation with RVR.  Rate better controlled, with amiodarone.  Echocardiogram ordered Showed preserved LVEF and pseudo normalization and Moderate hypokinesis of the left ventricular, entire anteroseptal wall. tsh wnl. Serial troponin's flattened. .  Pt remains asymptomatic, no chest pain or sob.cardiology consulted and recommendations given. Continue with amiodarone 200 mg BID and transition to  daily for 2 weeks .  Hypertension: Well controlled.   Type 2 DM continue insulin as you are taking at home.  Continue metformin.  Estimated body mass index is 24.76 kg/m as calculated from the following:  Height as of this encounter: 6\' 4"  (1.93 m).   Weight as of this encounter: 92.3 kg.  Discharge Instructions  Discharge Instructions    Diet - low sodium heart healthy   Complete by: As directed     Increase activity slowly   Complete by: As directed      Allergies as of 11/20/2018   No Known Allergies     Medication List    STOP taking these medications   BC HEADACHE POWDER PO   lisinopril-hydrochlorothiazide 20-12.5 MG tablet Commonly known as: Zestoretic   metFORMIN 1000 MG tablet Commonly known as: GLUCOPHAGE   mupirocin ointment 2 % Commonly known as: Bactroban   naproxen sodium 220 MG tablet Commonly known as: ALEVE     TAKE these medications   acetaminophen 325 MG tablet Commonly known as: TYLENOL Take 1-2 tablets (325-650 mg total) by mouth every 6 (six) hours as needed for mild pain (pain score 1-3 or temp > 100.5).   amiodarone 200 MG tablet Commonly known as: PACERONE Take 1 tablet (200 mg total) by mouth 2 (two) times daily.   furosemide 40 MG tablet Commonly known as: LASIX Take 1 tablet (40 mg total) by mouth daily. Start taking on: November 21, 2018   gabapentin 300 MG capsule Commonly known as: NEURONTIN Take 1 capsule (300 mg total) by mouth 3 (three) times daily.   glipiZIDE 5 MG tablet Commonly known as: Glucotrol Take 1 tablet (5 mg total) by mouth daily before breakfast.   HYDROcodone-acetaminophen 5-325 MG tablet Commonly known as: NORCO/VICODIN Take 1 tablet by mouth every 6 (six) hours as needed for severe pain.   insulin aspart 100 UNIT/ML injection Commonly known as: novoLOG Inject 3 Units into the skin 3 (three) times daily with meals.   insulin detemir 100 UNIT/ML injection Commonly known as: LEVEMIR Inject 0.08 mLs (8 Units total) into the skin at bedtime.   One-A-Day Mens 50+ Advantage Tabs Take 1 tablet by mouth daily with breakfast.   pantoprazole 40 MG tablet Commonly known as: PROTONIX Take 1 tablet (40 mg total) by mouth 2 (two) times daily.   polyethylene glycol 17 g packet Commonly known as: MIRALAX / GLYCOLAX Take 17 g by mouth daily as needed for mild constipation.   potassium chloride SA 20 MEQ  tablet Commonly known as: K-DUR Take 1 tablet (20 mEq total) by mouth daily. Start taking on: November 21, 2018      Follow-up Information    Nadara Mustarduda, Marcus V, MD In 1 week.   Specialty: Orthopedic Surgery Contact information: [address] [place] [address] [phone]        Pricilla Riffleoss, Paula V, MD Follow up.   Specialty: Cardiology Contact information: [address] [place] [address] [phone]          No Known Allergies  Consultations: Vascular surgery Ortho  Procedures/Studies: Dg Chest 1 View  Result Date: 11/13/2018 CLINICAL DATA:  Tachycardia. EXAM: CHEST  1 VIEW COMPARISON:  None. FINDINGS: The heart size and mediastinal contours are within normal limits. Both lungs are clear. The visualized skeletal structures are unremarkable. IMPRESSION: No active disease. Electronically Signed   By: Obie DredgeWilliam T Derry M.D.   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 Subsequent encounter. EXAM: MRI OF THE RIGHT  FOREFOOT WITHOUT CONTRAST TECHNIQUE: Multiplanar, multisequence MR imaging of the right forefoot was performed. No intravenous contrast was administered. COMPARISON:  Plain films right foot 09/12/2018, 09/20/2018 and 11/13/2018. FINDINGS: Bones/Joint/Cartilage There is edema in the distal 4 cm of the fifth metatarsal and throughout the fifth toe consistent with osteomyelitis. Gas is seen in the fifth metatarsal head. Bone marrow signal is otherwise unremarkable. Ligaments Intact. Muscles and Tendons Intact.  No intramuscular fluid collection. Soft tissues A large skin ulceration over the distal fifth metatarsal and proximal fifth toe is identified. Bone appears exposed. No abscess. IMPRESSION: Large skin ulceration along the lateral aspect of the  distal fifth metatarsal proximal fifth toe with findings consistent with osteomyelitis in the distal 4 cm of the fifth metatarsal and throughout the fifth toe. 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 Negative for abscess or septic joint. Electronically Signed   By: Drusilla Kannerhomas  Dalessio M.D.   On: 11/14/2018 12:29   Dg Foot Complete Right  Result Date: 11/13/2018 CLINICAL DATA:  Infection. Foot pain. EXAM: RIGHT FOOT COMPLETE - 3+ VIEW COMPARISON:  Radiograph 09/20/2018 FINDINGS: Soft tissue ulcer about the lateral aspect of the fifth metatarsal and metatarsal phalangeal joint with soft tissue air. No associated periosteal reaction or bony destruction. There is sclerosis about the adjacent sesamoid. Hammertoe deformity of the digits limits assessment. No acute fracture. There are vascular calcifications. IMPRESSION: Soft tissue ulcer about the fifth metatarsophalangeal joint with soft tissue air. No radiographic findings of osteomyelitis. Electronically Signed   By: Narda RutherfordMelanie  Sanford M.D.   On: 11/13/2018 19:48   Vas Koreas Vanice Sarahbi With/wo Tbi  Result Date: 11/15/2018 LOWER EXTREMITY DOPPLER STUDY Indications: Gangrene, and Dry gangrene right foot. Diminishsed pulses              bilaterally. High Risk Factors: Diabetes.  Comparison Study: No comparison study available Performing Technologist: Milta DeitersSlaughter, [place] [employer]  Examination Guidelines: A complete evaluation includes at minimum, Doppler waveform signals and systolic blood pressure reading at the level of bilateral brachial, anterior tibial, and posterior tibial arteries, when vessel segments are accessible. Bilateral testing is considered an integral part of a complete examination. Photoelectric Plethysmograph (PPG) waveforms and toe systolic pressure readings are included as required and additional duplex testing as needed. Limited examinations for reoccurring indications may be performed as noted.  ABI Findings:  +---------+------------------+-----+-------------------+-----------------------+ Right    Rt Pressure (mmHg)IndexWaveform           Comment                 +---------+------------------+-----+-------------------+-----------------------+ Brachial 142                    triphasic                                  +---------+------------------+-----+-------------------+-----------------------+ PTA      116               0.82 dampened monophasic                        +---------+------------------+-----+-------------------+-----------------------+ DP       245               1.73 dampened monophasic                        +---------+------------------+-----+-------------------+-----------------------+ Great Toe  Unable to fully                                                            evaluate due to the                                                        size of the toe and the                                                    cuff                    +---------+------------------+-----+-------------------+-----------------------+ +---------+------------------+-----+-------------------+-------+ Left     Lt Pressure (mmHg)IndexWaveform           Comment +---------+------------------+-----+-------------------+-------+ Brachial 140                    triphasic                  +---------+------------------+-----+-------------------+-------+ PTA      130               0.92 dampened monophasic        +---------+------------------+-----+-------------------+-------+ DP       151               1.06 dampened monophasic        +---------+------------------+-----+-------------------+-------+ Great Toe29                0.20                            +---------+------------------+-----+-------------------+-------+ +-------+-----------+----------------+------------+------------+ ABI/TBIToday's ABIToday's TBI      Previous ABIPrevious TBI +-------+-----------+----------------+------------+------------+ Right  1.73       Unable to obtain                         +-------+-----------+----------------+------------+------------+ Left   1.06       0.20                                     +-------+-----------+----------------+------------+------------+  Summary: Right - ABIs indicate non compressible arteries probably secondary to calcification. TBIs are unreliable - see comments in the chart listed above. Left - ABIs apperar normal however. ABIs are unreliable. Doppler waveforms and signals do not support the ABI value possible due to a falsely elevation of pressures. TBI is abnormal  *See table(s) above for measurements and observations.  Electronically signed by Lemar Livings MD on 11/15/2018 at 4:09:15 PM.    Final     (Echo, Carotid, EGD, Colonoscopy, ERCP)    Subjective:  Anxious to go to rehab Discharge Exam: Vitals:   11/19/18 2252 11/20/18 0349  BP: (!) 104/58 107/75  Pulse: 75 78  Resp:    Temp: 98 F (36.7 C) 98.8 F (37.1 C)  SpO2:  96% 98%   Vitals:   11/19/18 0530 11/19/18 1519 11/19/18 2252 11/20/18 0349  BP: 107/83 132/76 (!) 104/58 107/75  Pulse: 89 80 75 78  Resp: 16     Temp: 98.6 F (37 C) 98.7 F (37.1 C) 98 F (36.7 C) 98.8 F (37.1 C)  TempSrc: Oral Oral Oral Oral  SpO2: 99% 100% 96% 98%  Weight: 96.7 kg   92.3 kg  Height:        General: Pt is alert, awake, not in acute distress Cardiovascular: RRR, S1/S2 +, no rubs, no gallops Respiratory: CTA bilaterally, no wheezing, no rhonchi Abdominal: Soft, NT, ND, bowel sounds + Extremities right BKA   The results of significant diagnostics from this hospitalization (including imaging, microbiology, ancillary and laboratory) are listed below for reference.     Microbiology: Recent Results (from the past 240 hour(s))  Blood Culture (routine x 2)     Status: None   Collection Time: 11/13/18  6:33 PM    Specimen: BLOOD  Result Value Ref Range Status   Specimen Description BLOOD RIGHT ANTECUBITAL  Final   Special Requests   Final    BOTTLES DRAWN AEROBIC AND ANAEROBIC Blood Culture adequate volume   Culture   Final    NO GROWTH 5 DAYS Performed at [facility], [address] [place], [place] [address]    Report Status 11/18/2018 FINAL  Final  SARS Coronavirus 2     Status: None   Collection Time: 11/13/18  6:37 PM  Result Value Ref Range Status   SARS Coronavirus 2 NOT DETECTED NOT DETECTED Final    Comment: (NOTE) SARS-CoV-2 target nucleic acids are NOT DETECTED. The SARS-CoV-2 RNA is generally detectable in upper and lower respiratory specimens during the acute phase of infection.  Negative  results do not preclude SARS-CoV-2 infection, do not rule out co-infections with other pathogens, and should not be used as the sole basis for treatment or other patient management decisions.  Negative results must be combined with clinical observations, patient history, and epidemiological information. The expected result is Not Detected. Fact Sheet for Patients: http://www.biofiredefense.com/wp-content/uploads/2020/03/BIOFIRE-COVID -19-patients.pdf Fact Sheet for Healthcare Providers: http://www.biofiredefense.com/wp-content/uploads/2020/03/BIOFIRE-COVID -19-hcp.pdf This test is not yet approved or cleared by the [place] and  has been authorized for detection and/or diagnosis of SARS-CoV-2 by FDA under an Emergency Use Authorization (EUA).  This EUA will remain in effec t (meaning this test can be used) for the duration of  the COVID-19 declaration under Section 564(b)(1) of the Act, 21 U.S.C. section 360bbb-3(b)(1), unless the authorization is terminated or revoked sooner. Performed at [facility], [place] [address], [place], [place] [address]   Blood Culture (routine x 2)     Status: None   Collection Time: 11/13/18  6:48 PM   Specimen: BLOOD LEFT ARM   Result Value Ref Range Status   Specimen Description BLOOD LEFT ARM  Final   Special Requests   Final    BOTTLES DRAWN AEROBIC AND ANAEROBIC Blood Culture adequate volume   Culture   Final    NO GROWTH 5 DAYS Performed at [facility], [place] [address], [place], [place] [address]    Report Status 11/18/2018 FINAL  Final  Surgical PCR screen     Status: None   Collection Time: 11/15/18 10:00 PM   Specimen: Nasal Mucosa; Nasal Swab  Result Value Ref Range Status   MRSA, PCR NEGATIVE NEGATIVE Final   Staphylococcus aureus NEGATIVE NEGATIVE Final  Comment: (NOTE) The Xpert SA Assay (FDA approved for NASAL specimens in patients 71 years of age and older), is one component of a comprehensive surveillance program. It is not intended to diagnose infection nor to guide or monitor treatment. Performed at [facility], [address] [place], [place] [address]      Labs: BNP (last 3 results) No results for input(s): BNP in the last 8760 hours. Basic Metabolic Panel: Recent Labs  Lab 11/15/18 1411 11/16/18 0145 11/17/18 1646 11/18/18 0859 11/19/18 0457 11/20/18 1014  NA  --  140 140 135 134* 135  K  --  3.7 3.9 3.9 3.9 4.6  CL  --  109 107 102 100 101  CO2  --  19* 21* 23 27 25   GLUCOSE  --  251* 62* 160* 115* 168*  BUN  --  31* 17 15 16 21   CREATININE  --  1.32* 1.28* 1.11 1.21 1.40*  CALCIUM  --  8.7* 8.4* 8.5* 8.3* 8.8*  MG 2.0  --   --   --   --   --    Liver Function Tests: Recent Labs  Lab 11/13/18 1832 11/15/18 0458  AST 32 21  ALT 22 20  ALKPHOS 78 67  BILITOT 0.6 0.5  PROT 7.7 6.9  ALBUMIN 2.6* 2.2*   No results for input(s): LIPASE, AMYLASE in the last 168 hours. No results for input(s): AMMONIA in the last 168 hours. CBC: Recent Labs  Lab 11/13/18 1832  11/15/18 1117 11/16/18 0145 11/17/18 1646 11/18/18 0859 11/19/18 0457  WBC 44.9*   < > 30.5* 27.0* 12.2* 19.3* 16.2*  NEUTROABS 39.5*  --   --   --   --   --   --   HGB 9.7*   < >  9.3* 8.7* 9.0* 8.6* 8.5*  HCT 29.2*   < > 28.2* 26.5* 27.2* 26.7* 26.4*  MCV 91.5   < > 89.2 91.7 90.7 92.1 92.3  PLT 356   < > 366 362 68* PLATELET CLUMPS NOTED ON SMEAR, COUNT APPEARS ADEQUATE 373   < > = values in this interval not displayed.   Cardiac Enzymes: Recent Labs  Lab 11/15/18 1411 11/15/18 1930 11/16/18 0145  TROPONINI 0.03* 0.03* 0.03*   BNP: Invalid input(s): POCBNP CBG: Recent Labs  Lab 11/19/18 0746 11/19/18 1645 11/19/18 2108 11/20/18 0753 11/20/18 1140  GLUCAP 139* 85 135* 152* 112*   D-Dimer No results for input(s): DDIMER in the last 72 hours. Hgb A1c Recent Labs    11/18/18 0859  HGBA1C 6.4*   Lipid Profile No results for input(s): CHOL, HDL, LDLCALC, TRIG, CHOLHDL, LDLDIRECT in the last 72 hours. Thyroid function studies No results for input(s): TSH, T4TOTAL, T3FREE, THYROIDAB in the last 72 hours.  Invalid input(s): FREET3 Anemia work up No results for input(s): VITAMINB12, FOLATE, FERRITIN, TIBC, IRON, RETICCTPCT in the last 72 hours. Urinalysis    Component Value Date/Time   COLORURINE YELLOW 11/14/2018 0324   APPEARANCEUR HAZY (A) 11/14/2018 0324   LABSPEC 1.015 11/14/2018 0324   PHURINE 5.0 11/14/2018 0324   GLUCOSEU NEGATIVE 11/14/2018 0324   HGBUR NEGATIVE 11/14/2018 0324   BILIRUBINUR NEGATIVE 11/14/2018 0324   KETONESUR NEGATIVE 11/14/2018 0324   PROTEINUR NEGATIVE 11/14/2018 0324   UROBILINOGEN 0.2 12/01/2010 1914   NITRITE NEGATIVE 11/14/2018 0324   LEUKOCYTESUR NEGATIVE 11/14/2018 0324   Sepsis Labs Invalid input(s): PROCALCITONIN,  WBC,  LACTICIDVEN Microbiology Recent Results (from the past 240 hour(s))  Blood Culture (routine  x 2)     Status: None   Collection Time: 11/13/18  6:33 PM   Specimen: BLOOD  Result Value Ref Range Status   Specimen Description BLOOD RIGHT ANTECUBITAL  Final   Special Requests   Final    BOTTLES DRAWN AEROBIC AND ANAEROBIC Blood Culture adequate volume   Culture   Final    NO GROWTH  5 DAYS Performed at [facility], [address] [place], [place] [address]    Report Status 11/18/2018 FINAL  Final  SARS Coronavirus 2     Status: None   Collection Time: 11/13/18  6:37 PM  Result Value Ref Range Status   SARS Coronavirus 2 NOT DETECTED NOT DETECTED Final    Comment: (NOTE) SARS-CoV-2 target nucleic acids are NOT DETECTED. The SARS-CoV-2 RNA is generally detectable in upper and lower respiratory specimens during the acute phase of infection.  Negative  results do not preclude SARS-CoV-2 infection, do not rule out co-infections with other pathogens, and should not be used as the sole basis for treatment or other patient management decisions.  Negative results must be combined with clinical observations, patient history, and epidemiological information. The expected result is Not Detected. Fact Sheet for Patients: http://www.biofiredefense.com/wp-content/uploads/2020/03/BIOFIRE-COVID -19-patients.pdf Fact Sheet for Healthcare Providers: http://www.biofiredefense.com/wp-content/uploads/2020/03/BIOFIRE-COVID -19-hcp.pdf This test is not yet approved or cleared by the [place] and  has been authorized for detection and/or diagnosis of SARS-CoV-2 by FDA under an Emergency Use Authorization (EUA).  This EUA will remain in effec t (meaning this test can be used) for the duration of  the COVID-19 declaration under Section 564(b)(1) of the Act, 21 U.S.C. section 360bbb-3(b)(1), unless the authorization is terminated or revoked sooner. Performed at [facility], [address], [place], [place] [address]   Blood Culture (routine x 2)     Status: None   Collection Time: 11/13/18  6:48 PM   Specimen: BLOOD LEFT ARM  Result Value Ref Range Status   Specimen Description BLOOD LEFT ARM  Final   Special Requests   Final    BOTTLES DRAWN AEROBIC AND ANAEROBIC Blood Culture adequate volume   Culture   Final    NO GROWTH 5 DAYS Performed at [facility], [address] [place], [place] [address]    Report Status 11/18/2018 FINAL  Final  Surgical PCR screen     Status: None   Collection Time: 11/15/18 10:00 PM   Specimen: Nasal Mucosa; Nasal Swab  Result Value Ref Range Status   MRSA, PCR NEGATIVE NEGATIVE Final   Staphylococcus aureus NEGATIVE NEGATIVE Final    Comment: (NOTE) The Xpert SA Assay (FDA approved for NASAL specimens in patients 59 years of age and older), is one component of a comprehensive surveillance program. It is not intended to diagnose infection nor to guide or monitor treatment. Performed at [facility], [address], [place], [place] [address]      Time coordinating discharge:  35 minutes  SIGNED:   Alwyn Ren, MD  [facility] 11/20/2018, 1:04 PM Pager   If 7PM-7AM, please contact night-coverage [email or web address] Password [email or web address]

## 2018-11-20 NOTE — H&P (Signed)
Physical Medicine and Rehabilitation Admission H&P        Chief Complaint  Patient presents with   Tachycardia  Chief complaint: Weakness HPI: Joseph [name] is a 73 year old right-handed male with history of type 2 diabetes mellitus, hypertension, chronic diastolic congestive heart failure, CKD stage II.  Per chart review patient lives alone independent prior to admission.  Presented 11/13/2018 with right foot ischemic changes with nausea.  Patient reportedly had scraped his foot approximately 2 months ago while doing some tree work.  X-rays revealed soft tissue ulcer about the fifth metatarsophageal joint with soft tissue air.  No osteomyelitis.  MRI of the foot completed again gas in the fifth metatarsal head consistent with osteomyelitis.  Follow-up orthopedic service Dr. Sharol Given initially with conservative care.  Hospital course complicated by anemia hemoglobin 6.7 with gastroenterology consulted 11/14/2018 recommendations of colonoscopy of which patient refused and maintained on PPI with recommendation to transfuse if symptomatic.  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 Sharol Given.  Hospital course pain management as well as leukocytosis 30,500 maintained on antibiotic therapy.  Patient developed atrial fibrillation with runs of SVT cardiology services consulted 11/15/2018 and he did receive IV Lopressor and Cardizem with little response with IV amiodarone started and patient converted to sinus rhythm on the evening of 11/15/2018.  Plan was to begin aspirin on discharge.  Echocardiogram with ejection fraction of 55% moderate hypokinesis of the left ventricular, entire anterior septal wall.  Patient placed on amiodarone 200 mg twice daily cardiac rate controlled.  Leukocytosis improved 16,000 antibiotic therapy has been discontinued.  Hemoglobin continues to stabilize 8.5.  Therapy evaluations completed and  patient was admitted for a comprehensive rehab program.   Review of Systems  Constitutional: Positive for fever. Negative for chills.  HENT: Negative for hearing loss.   Eyes: Negative for blurred vision and double vision.  Respiratory: Negative for cough and shortness of breath.   Cardiovascular: Positive for leg swelling. Negative for chest pain and palpitations.  Gastrointestinal: Positive for constipation. Negative for heartburn, nausea and vomiting.  Genitourinary: Negative for flank pain and hematuria.  Musculoskeletal: Positive for myalgias.  Skin: Negative for rash.  Neurological: Positive for headaches.  All other systems reviewed and are negative.       Past Medical History:  Diagnosis Date   Diabetes mellitus type 2 in nonobese [facility])     Hypertension          Past Surgical History:  Procedure Laterality Date   AMPUTATION Right 11/16/2018    Procedure: RIGHT BELOW KNEE AMPUTATION;  Surgeon: Newt Minion, MD;  Location: [facility];  Service: Orthopedics;  Laterality: Right;   HAND SURGERY            Family History  Problem Relation Age of Onset   Diabetes Father     Social History:  reports that he has never smoked. He has never used smokeless tobacco. He reports current alcohol use. He reports that he does not use drugs. Allergies: No Known Allergies Medications Prior to Admission  Medication Sig Dispense Refill   Aspirin-Salicylamide-Caffeine (BC HEADACHE POWDER PO) Take 4 packets by mouth 4 (four) times daily as needed (for pain).        glipiZIDE (GLUCOTROL) 5 MG tablet Take 1 tablet (5 mg total) by mouth daily before breakfast. 90 tablet 0   HYDROcodone-acetaminophen (NORCO/VICODIN) 5-325 MG tablet Take 1 tablet by mouth  every 6 (six) hours as needed for severe pain. 10 tablet 0   lisinopril-hydrochlorothiazide (ZESTORETIC) 20-12.5 MG tablet Take 1 tablet by mouth daily. 90 tablet 3   metFORMIN (GLUCOPHAGE) 1000 MG tablet Take 1 tablet (1,000 mg total) by  mouth 2 (two) times daily. 180 tablet 1   Multiple Vitamins-Minerals (ONE-A-DAY MENS 50+ ADVANTAGE) TABS Take 1 tablet by mouth daily with breakfast.       naproxen sodium (ALEVE) 220 MG tablet Take 440 mg by mouth 2 (two) times daily as needed (for pain).       mupirocin ointment (BACTROBAN) 2 % Apply bid to wound for 10 dyas (Patient not taking: Reported on 11/13/2018) 22 g 0     Drug Regimen Review Drug regimen was reviewed and remains appropriate with no significant issues identified   Home: Home Living Family/patient expects to be discharged to:: Private residence Living Arrangements: Alone Available Help at Discharge: Family, Available PRN/intermittently, Friend(s) Type of Home: House Home Access: Stairs to enter Secretary/administratorntrance Stairs-Number of Steps: 5+1+1 Entrance Stairs-Rails: Right, Left Home Layout: Other (Comment)(split level home) Bathroom Toilet: Standard Home Equipment: Cane - single point Additional Comments: was out driving and independent with no AD prior to this   Functional History: Prior Function Level of Independence: Independent Comments: driving, mowing his lawn, very active prior   Functional Status:  Mobility: Bed Mobility Overal bed mobility: Needs Assistance Bed Mobility: Supine to Sit, Sit to Supine Supine to sit: Min assist Sit to supine: Mod assist General bed mobility comments: assist for bilat LEs, increased time Transfers Overall transfer level: Needs assistance Equipment used: Rolling walker (2 wheeled) Transfers: Lateral/Scoot Transfers Sit to Stand: Max assist, From elevated surface  Lateral/Scoot Transfers: Min assist General transfer comment: pt able to scoot from bed to recliner chair wtih min A for LE placement and forward wt shift, cues for UE placement Ambulation/Gait General Gait Details: unable     ADL: ADL Overall ADL's : Needs assistance/impaired Eating/Feeding: Independent, Sitting Grooming: Set up, Sitting Upper Body  Bathing: Set up, Sitting Lower Body Bathing: Maximal assistance, With adaptive equipment, Sitting/lateral leans, Sit to/from stand Upper Body Dressing : Set up, Sitting Lower Body Dressing: Maximal assistance, Sitting/lateral leans, Sit to/from stand, With adaptive equipment Toilet Transfer: Maximal assistance, Squat-pivot, BSC, RW Toileting- Clothing Manipulation and Hygiene: Minimal assistance, Sitting/lateral lean Tub/ Shower Transfer: Maximal assistance, Shower seat, Squat-pivot, Ambulation Functional mobility during ADLs: Maximal assistance, Rolling walker General ADL Comments: currently a max A for transfers due to unfamiliarity with DME and balance compromises   Cognition: Cognition Overall Cognitive Status: Within Functional Limits for tasks assessed Orientation Level: Oriented X4 Cognition Arousal/Alertness: Awake/alert Behavior During Therapy: WFL for tasks assessed/performed Overall Cognitive Status: Within Functional Limits for tasks assessed General Comments: occasionally tangential   Physical Exam: Blood pressure 107/75, pulse 78, temperature 98.8 F (37.1 C), temperature source Oral, resp. rate 16, height 6\' 4"  (1.93 m), weight 92.3 kg, SpO2 98 %. Physical Exam  Constitutional: He is oriented to person, place, and time. He appears well-nourished. No distress.  HENT:  Head: Normocephalic and atraumatic.  Eyes: Pupils are equal, round, and reactive to light. EOM are normal. Left eye exhibits no discharge.  Neck: Normal range of motion. No thyromegaly present.  Cardiovascular: Normal rate and regular rhythm.  No murmur heard. Respiratory: Effort normal and breath sounds normal. No respiratory distress. He has no wheezes.  GI: Soft. He exhibits no distension.  Musculoskeletal:     Comments: Right leg in vac  dressing, associated edema.   Neurological: He is alert and oriented to person, place, and time. No cranial nerve deficit.  UE 5/5. RLE 3/5 HF, LLE: 4/5 HF, KE ,  ADF, APF. No focal sensory findings.   Skin: Skin is warm. He is not diaphoretic.  Limb guard in place to right BKA site.  Psychiatric: He has a normal mood and affect. His behavior is normal. Thought content normal.      Lab Results Last 48 Hours        Results for orders placed or performed during the hospital encounter of 11/13/18 (from the past 48 hour(s))  Glucose, capillary     Status: Abnormal    Collection Time: 11/18/18 12:04 PM  Result Value Ref Range    Glucose-Capillary 146 (H) 70 - 99 mg/dL  Glucose, capillary     Status: None    Collection Time: 11/18/18  4:35 PM  Result Value Ref Range    Glucose-Capillary 72 70 - 99 mg/dL  Glucose, capillary     Status: Abnormal    Collection Time: 11/18/18  9:50 PM  Result Value Ref Range    Glucose-Capillary 143 (H) 70 - 99 mg/dL  CBC     Status: Abnormal    Collection Time: 11/19/18  4:57 AM  Result Value Ref Range    WBC 16.2 (H) 4.0 - 10.5 K/uL    RBC 2.86 (L) 4.22 - 5.81 MIL/uL    Hemoglobin 8.5 (L) 13.0 - 17.0 g/dL    HCT 96.026.4 (L) 45.439.0 - 52.0 %    MCV 92.3 80.0 - 100.0 fL    MCH 29.7 26.0 - 34.0 pg    MCHC 32.2 30.0 - 36.0 g/dL    RDW 09.813.5 11.911.5 - 14.715.5 %    Platelets 373 150 - 400 K/uL    nRBC 0.0 0.0 - 0.2 %      Comment: Performed at [facility], [address] [place], [place] [address]  Basic metabolic panel     Status: Abnormal    Collection Time: 11/19/18  4:57 AM  Result Value Ref Range    Sodium 134 (L) 135 - 145 mmol/L    Potassium 3.9 3.5 - 5.1 mmol/L    Chloride 100 98 - 111 mmol/L    CO2 27 22 - 32 mmol/L    Glucose, Bld 115 (H) 70 - 99 mg/dL    BUN 16 8 - 23 mg/dL    Creatinine, Ser 2.131.21 0.61 - 1.24 mg/dL    Calcium 8.3 (L) 8.9 - 10.3 mg/dL    GFR calc non Af Amer 60 (L) >60 mL/min    GFR calc Af Amer >60 >60 mL/min    Anion gap 7 5 - 15      Comment: Performed at [facility], [address] [place], [place] [address]  Glucose, capillary     Status: Abnormal    Collection Time:  11/19/18  7:46 AM  Result Value Ref Range    Glucose-Capillary 139 (H) 70 - 99 mg/dL  Glucose, capillary     Status: None    Collection Time: 11/19/18  4:45 PM  Result Value Ref Range    Glucose-Capillary 85 70 - 99 mg/dL  Glucose, capillary     Status: Abnormal    Collection Time: 11/19/18  9:08 PM  Result Value Ref Range    Glucose-Capillary 135 (H) 70 - 99 mg/dL  Glucose, capillary     Status: Abnormal  Collection Time: 11/20/18  7:53 AM  Result Value Ref Range    Glucose-Capillary 152 (H) 70 - 99 mg/dL     Imaging Results (Last 48 hours)  No results found.           Medical Problem List and Plan: 1.  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  Antithrombotics: -DVT/anticoagulation: SCDs left lower extremity             -antiplatelet therapy: Await plan to begin aspirin 3. Pain Management: Neurontin 300 mg 3 times daily, Robaxin and oxycodone as needed 4. Mood: Provide emotional support             -antipsychotic agents: N/A 5. Neuropsych: This patient is capable of making decisions on his own behalf. 6. Skin/Wound Care:  Vac and limb guard in place             -continue vac through 6/25  7. Fluids/Electrolytes/Nutrition: Routine in and outs with follow-up chemistries 8.  Acute blood loss anemia.  Patient refused any GI work-up.  Follow-up CBC upon admit.              Most recent hgb 8.5 6/22 9.  Acute onset atrial fibrillation.  Amiodarone 200 mg twice daily.  Cardiology follow-up 10.  Chronic diastolic congestive heart failure.  Lasix 40 mg daily 11.  Diabetes mellitus with peripheral neuropathy.  Hemoglobin A1c 5.7.  Glucotrol 5 mg daily, NovoLog 3 units 3 times daily, Levemir 8 units nightly.  Check blood sugars before meals and at bedtime 12.  CKD stage II.  Follow-up chemistries on admit             -Cr 1.4 13.  Constipation.  Laxative assistance      Post  Admission Physician Evaluation: 1. Functional deficits secondary  to right BKA. 2. Patient is admitted to receive collaborative, interdisciplinary care between the physiatrist, rehab nursing staff, and therapy team. 3. Patient's level of medical complexity and substantial therapy needs in context of that medical necessity cannot be provided at a lesser intensity of care such as a SNF. 4. Patient has experienced substantial functional loss from his/her baseline which was documented above under the "Functional History" and "Functional Status" headings.  Judging by the patient's diagnosis, physical exam, and functional history, the patient has potential for functional progress which will result in measurable gains while on inpatient rehab.  These gains will be of substantial and practical use upon discharge  in facilitating mobility and self-care at the household level. 5. Physiatrist will provide 24 hour management of medical needs as well as oversight of the therapy plan/treatment and provide guidance as appropriate regarding the interaction of the two. 6. The Preadmission Screening has been reviewed and patient status is unchanged unless otherwise stated above. 7. 24 hour rehab nursing will assist with bladder management, bowel management, safety, skin/wound care, disease management, medication administration, pain management and patient education  and help integrate therapy concepts, techniques,education, etc. 8. PT will assess and treat for/with: Lower extremity strength, range of motion, stamina, balance, functional mobility, safety, adaptive techniques and equipment, pre-prosthetic education.   Goals are: mod I to supervision. 9. OT will assess and treat for/with: ADL's, functional mobility, safety, upper extremity strength, adaptive techniques and equipment, pre-prosthetic education.   Goals are: mod I to supervision. Therapy may not yet proceed with showering this  patient. 10. SLP will assess and  treat for/with: n/a.  Goals are: n/a. 11. Case Management and Social Worker will assess and treat for psychological issues and discharge planning. 12. Team conference will be held weekly to assess progress toward goals and to determine barriers to discharge. 13. Patient will receive at least 3 hours of therapy per day at least 5 days per week. 14. ELOS: 10-14 days       15. Prognosis:  excellent   I have personally performed a face to face diagnostic evaluation of this patient and formulated the key components of the plan.  Additionally, I have personally reviewed laboratory data, imaging studies, as well as relevant notes and concur with the physician assistant's documentation above.  Ranelle Oyster, MD, FAAPMR       Mcarthur Rossetti Angiulli, PA-C 11/20/2018

## 2018-11-20 NOTE — Progress Notes (Signed)
   Will give additional dose of IV Lasix 40mg  today prior to patient being transferred to inpatient rehab. Patient previously on Lisinopril-HCTZ at home but this was not restarted on admission. Echo this admission showed low normal EF. Will plan to start PO Lasix 40mg  daily tomorrow as well a K-Dur 20 mEq. Recommend rechecking BMET on Thursday  11/22/2018.   Darreld Mclean, PA-C 11/20/2018 11:35 AM

## 2018-11-20 NOTE — IPOC Note (Signed)
Individualized overall Plan of Care [facility]) Patient Details Name: Joseph [name] MRN: [id number] DOB: [birth year]  Admitting Diagnosis: Right BKA with multiple medical complications  Hospital Problems: Active Problems:   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[place])   Acute blood loss anemia   Chronic diastolic congestive heart failure (HCC)   Diabetic peripheral neuropathy (HCC)   CKD (chronic kidney disease), stage II   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)   Leukocytosis     Functional Problem List: Nursing Pain, Skin Integrity  PT Balance, Edema, Endurance, Motor, Perception, Safety, Sensory, Skin Integrity, Pain  OT Balance, Endurance, Pain, Safety  SLP    TR         Basic ADL's: OT Grooming, Bathing, Dressing, Toileting     Advanced  ADL's: OT Simple Meal Preparation     Transfers: PT Bed Mobility, Bed to Chair, Car, [employer], Futures trader, Metallurgist: PT Ambulation, Emergency planning/management officer, Stairs     Additional Impairments: OT    SLP        TR      Anticipated Outcomes Item Anticipated Outcome  Self Feeding no goal  Swallowing      Basic self-care  MOD I  Toileting  MOD I toilet, S shower   Bathroom Transfers MOD I toilet; S Shower  Bowel/Bladder  continent of bowel and bladder  Transfers  supervision  Locomotion  CGA  Communication     Cognition     Pain  pain less than 5 on scale of 0-10  Safety/Judgment  remain free of injury. Fall prevention   Therapy Plan: PT Intensity: Minimum of 1-2 x/day ,45 to 90 minutes PT Frequency: 5 out of 7 days PT Duration Estimated Length of Stay: 10-14 days OT Intensity: Minimum of 1-2 x/day, 45 to 90 minutes OT Frequency: 5 out of 7 days OT Duration/Estimated Length of Stay: 10-14      Team Interventions: Nursing Interventions Patient/Family Education, Skin Care/Wound Management, Bladder Management, Pain Management, Psychosocial Support, Disease Management/Prevention  PT  interventions Ambulation/gait training, Balance/vestibular training, Cognitive remediation/compensation, Disease management/prevention, Functional mobility training, Patient/family education, Splinting/orthotics, Therapeutic Exercise, Visual/perceptual remediation/compensation, UE/LE Coordination activities, Therapeutic Activities, Skin care/wound management, Functional electrical stimulation, Pain management, Discharge planning, Community reintegration, DME/adaptive equipment instruction, Neuromuscular re-education, Stair training, Psychosocial support, UE/LE Strength taining/ROM, Wheelchair propulsion/positioning  OT Interventions Training and development officer, Discharge planning, Pain management, Self Care/advanced ADL retraining, Therapeutic Activities, UE/LE Coordination activities, Disease mangement/prevention, Functional mobility training, Patient/family education, Skin care/wound managment, Therapeutic Exercise, Community reintegration, Engineer, drilling, Neuromuscular re-education, Splinting/orthotics, Psychosocial support, UE/LE Strength taining/ROM, Wheelchair propulsion/positioning  SLP Interventions    TR Interventions    SW/CM Interventions Discharge Planning, Psychosocial Support, Patient/Family Education   Barriers to Discharge MD  Medical stability, Wound care and Weight bearing restrictions  Nursing      PT      OT Decreased caregiver support, Home environment access/layout    SLP      SW       Team Discharge Planning: Destination: PT-Home ,OT- Home , SLP-  Projected Follow-up: PT-Home health PT, OT-  Home health OT, SLP-  Projected Equipment Needs: PT-To be determined, OT- 3 in 1 bedside comode, Tub/shower bench, SLP-  Equipment Details: PT- , OT-  Patient/family involved in discharge planning: PT- Patient,  OT-Patient, SLP-   MD ELOS: 7-10 days. Medical Rehab Prognosis:  Good Assessment: 73 year old right-handed male with history of type 2 diabetes  mellitus, hypertension,chronic diastolic congestive heart failure,CKD stage II. Presented 11/13/2018  with right foot ischemic changes with nausea. Patient reportedly had scraped his foot approximately 2 months ago while doing some tree work. X-rays revealed soft tissue ulcer about the fifth metatarsophagealjoint with soft tissue air. No osteomyelitis. MRI of the foot completed again gas in the fifth metatarsal head consistent with osteomyelitis. Follow-up orthopedic service Dr. Lajoyce Cornersuda initially with conservative care. Hospital course complicated by anemia hemoglobin 6.7 with gastroenterology consulted 11/14/2018 recommendations of colonoscopy of which patient refused and maintained on PPI with recommendation to transfuse if symptomatic. 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 Lajoyce Cornersuda. Hospital course pain management as well as leukocytosis 30,500 maintained on antibiotic therapy. Patient developed atrial fibrillation with runs of SVT cardiology services consulted 11/15/2018 and he did receive IV Lopressor and Cardizem with little response with IV amiodarone started and patient converted to sinus rhythm on the evening of 11/15/2018. Plan was to begin aspirin on discharge. Echocardiogram with ejection fraction of 55% moderate hypokinesis of the left ventricular, entire anterior septal wall. Patient placed on amiodarone 200 mg twice daily cardiac rate controlled. Leukocytosis improved 16,000 antibiotic therapy has been discontinued. Hemoglobin continues to stabilize. Patient with resulting functional deficits with mobility, endurance, transfers, self-care.  Will set goals for Mod I/Supervision with PT/OT.  Due to the current state of emergency, patients may not be receiving their 3-hours of Medicare-mandated therapy.  See Team Conference Notes for weekly updates to the plan of care

## 2018-11-20 NOTE — TOC Transition Note (Signed)
Transition of Care [facility]) - CM/SW Discharge Note   Patient Details  Name: Joseph [name] MRN: [id number] Date of Birth: 01/09/1946  [facility] CM/SW Contact:  Bethena Roys, RN Phone Number: 11/20/2018, 11:42 AM   Clinical Narrative:  CM received call from [place] Coordinator- she states that patient has a bed available on the 4th floor. No further needs from CM at this time.     Final next level of care: IP Rehab Facility Barriers to Discharge: No Barriers Identified   Patient Goals and CMS Choice Patient states their goals for this hospitalization and ongoing recovery are:: to get better CMS Medicare.gov Compare Post Acute Care list provided to:: Patient Choice offered to / list presented to : Patient   Discharge Plan and Services In-house Referral: NA   Post Acute Care Choice: IP Rehab, Skilled Nursing Facility            HH Arranged: NA  Social Determinants of Health (SDOH) Interventions     Readmission Risk Interventions No flowsheet data found.

## 2018-11-21 ENCOUNTER — Encounter (HOSPITAL_COMMUNITY): Payer: Self-pay

## 2018-11-21 ENCOUNTER — Inpatient Hospital Stay (HOSPITAL_COMMUNITY): Payer: Medicare Other

## 2018-11-21 DIAGNOSIS — E1142 Type 2 diabetes mellitus with diabetic polyneuropathy: Secondary | ICD-10-CM

## 2018-11-21 DIAGNOSIS — E8809 Other disorders of plasma-protein metabolism, not elsewhere classified: Secondary | ICD-10-CM

## 2018-11-21 DIAGNOSIS — D72829 Elevated white blood cell count, unspecified: Secondary | ICD-10-CM

## 2018-11-21 DIAGNOSIS — E46 Unspecified protein-calorie malnutrition: Secondary | ICD-10-CM

## 2018-11-21 DIAGNOSIS — D62 Acute posthemorrhagic anemia: Secondary | ICD-10-CM

## 2018-11-21 DIAGNOSIS — I5032 Chronic diastolic (congestive) heart failure: Secondary | ICD-10-CM

## 2018-11-21 DIAGNOSIS — N182 Chronic kidney disease, stage 2 (mild): Secondary | ICD-10-CM

## 2018-11-21 LAB — COMPREHENSIVE METABOLIC PANEL
ALT: 25 U/L (ref 0–44)
AST: 25 U/L (ref 15–41)
Albumin: 1.8 g/dL — ABNORMAL LOW (ref 3.5–5.0)
Alkaline Phosphatase: 52 U/L (ref 38–126)
Anion gap: 7 (ref 5–15)
BUN: 20 mg/dL (ref 8–23)
CO2: 27 mmol/L (ref 22–32)
Calcium: 8.7 mg/dL — ABNORMAL LOW (ref 8.9–10.3)
Chloride: 101 mmol/L (ref 98–111)
Creatinine, Ser: 1.22 mg/dL (ref 0.61–1.24)
GFR calc Af Amer: >60 mL/min (ref >60)
GFR calc non Af Amer: 59 mL/min — ABNORMAL LOW (ref >60)
Glucose, Bld: 111 mg/dL — ABNORMAL HIGH (ref 70–99)
Potassium: 4.3 mmol/L (ref 3.5–5.1)
Sodium: 135 mmol/L (ref 135–145)
Total Bilirubin: 0.3 mg/dL (ref 0.3–1.2)
Total Protein: 6.4 g/dL — ABNORMAL LOW (ref 6.5–8.1)

## 2018-11-21 LAB — CBC WITH DIFFERENTIAL/PLATELET
Abs Immature Granulocytes: 0 10*3/uL (ref 0.00–0.07)
Basophils Absolute: 0 10*3/uL (ref 0.0–0.1)
Basophils Relative: 0 %
Eosinophils Absolute: 0.4 10*3/uL (ref 0.0–0.5)
Eosinophils Relative: 3 %
HCT: 24.9 % — ABNORMAL LOW (ref 39.0–52.0)
Hemoglobin: 7.9 g/dL — ABNORMAL LOW (ref 13.0–17.0)
Lymphocytes Relative: 21 %
Lymphs Abs: 2.8 10*3/uL (ref 0.7–4.0)
MCH: 29 pg (ref 26.0–34.0)
MCHC: 31.7 g/dL (ref 30.0–36.0)
MCV: 91.5 fL (ref 80.0–100.0)
Monocytes Absolute: 0.1 10*3/uL (ref 0.1–1.0)
Monocytes Relative: 1 %
Neutro Abs: 9.9 10*3/uL — ABNORMAL HIGH (ref 1.7–7.7)
Neutrophils Relative %: 75 %
Platelets: 412 10*3/uL — ABNORMAL HIGH (ref 150–400)
RBC: 2.72 MIL/uL — ABNORMAL LOW (ref 4.22–5.81)
RDW: 13.4 % (ref 11.5–15.5)
WBC: 13.2 10*3/uL — ABNORMAL HIGH (ref 4.0–10.5)
nRBC: 0 % (ref 0.0–0.2)
nRBC: 0 /100 WBC

## 2018-11-21 LAB — GLUCOSE, CAPILLARY
Glucose-Capillary: 92 mg/dL (ref 70–99)
Glucose-Capillary: 125 mg/dL — ABNORMAL HIGH (ref 70–99)
Glucose-Capillary: 81 mg/dL (ref 70–99)
Glucose-Capillary: 98 mg/dL (ref 70–99)
Glucose-Capillary: 104 mg/dL — ABNORMAL HIGH (ref 70–99)

## 2018-11-21 LAB — NOVEL CORONAVIRUS, NAA (HOSP ORDER, SEND-OUT TO REF LAB; TAT 18-24 HRS): SARS-CoV-2, NAA: NOT DETECTED

## 2018-11-21 MED ORDER — PRO-STAT SUGAR FREE PO LIQD
30.0000 mL | Freq: Two times a day (BID) | ORAL | Status: DC
  Administered 2018-11-21 – 2018-12-04 (×27): 30 mL via ORAL
  Filled 2018-11-21 (×27): qty 30

## 2018-11-21 NOTE — Patient Care Conference (Signed)
Inpatient RehabilitationTeam Conference and Plan of Care Update Date: 11/21/2018   Time: 2:40 PM    Patient Name: Joseph [name]      Medical Record Number: [id number]  Date of Birth: 21-Sep-1945 Sex: Male         Room/Bed: 4M05C/4M05C-01 Payor Info: Payor: MEDICARE / Plan: MEDICARE PART A AND B / Product Type: *No Product type* /    Admitting Diagnosis: 6. ABI Team  Uni. Rt. BKA  Admit Date/Time:  11/20/2018  4:46 PM Admission Comments: No comment available   Primary Diagnosis:  <principal problem not specified> Principal Problem: <principal problem not specified>  Patient Active Problem List   Diagnosis Date Noted  . Acute blood loss anemia   . Chronic diastolic congestive heart failure (HCC)   . Diabetic peripheral neuropathy (HCC)   . CKD (chronic kidney disease), stage II   . 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 Leukocytosis   . 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 Cellulitis of right lower extremity   . Atrial fibrillation (HCC)   . Gangrene of right foot (HCC)   . Diabetic polyneuropathy associated with type 2 diabetes mellitus (HCC)   . PVD (peripheral vascular disease) (HCC)   . Critical lower limb ischemia   . Sepsis with acute renal failure (HCC) 11/13/2018  . Acute renal failure superimposed on stage 2 chronic kidney disease (HCC)   . Open wound of right foot   . 'light-for-dates' infant with signs of fetal malnutrition 10/17/2018  . DM (diabetes mellitus) (HCC) 07/02/2012  . Hypertension associated with diabetes (HCC) 07/02/2012    Expected Discharge Date: Expected Discharge Date: 12/04/18  Team Members Present: Physician leading conference: Dr. Maryla MorrowAnkit Patel Social Worker Present: Staci AcostaJenny Marrissa Dai, LCSW Nurse Present: Regino SchultzeHilary Lilja, RN PT Present: Wanda Plumparoline Cook, PT OT Present: Blanch MediaStephanie Schlosser, OT SLP Present: Feliberto Gottronourtney Payne, SLP PPS Coordinator present : Fae PippinMelissa Bowie, SLP     Current Status/Progress Goal Weekly Team Focus   Medical   Decreased functional debility due to right BKA  Therapies, follow labs, improve DM meds, follow ABLA  See above   Bowel/Bladder   Continent of Bladder/Bowel,LBM 6/22/  Maintain continence  Assess qs and prn   Swallow/Nutrition/ Hydration             ADL's   S-UB ADL; MAX A LB ADLs and sit to stand; min A squat pivot transfers;  S-MOD I  functional transfers, sit to stand, strengthening, ADL retraining AE training   Mobility   min assist bed mobility and squat pivot transfers, max assist sit<>stand, gait unable, supervision w/c propulsion  supervision-CGA  transfers, gait, standing tolerance, balance, strengthening   Communication             Safety/Cognition/ Behavioral Observations            Pain   S/P Right BKA with wound vac , verbalize pain 6-7/10, Medicated with prn Oxy 5mg -10 mg po  <4  QS /PRN assessment   Skin   Wound Vac and stump shinker in place with dressing changes M-W-F , no drainage to site/ or in container  Assist in promoting healty healing and decrease infection  QS/PRN assessment of wound site and drainage,and signs of infection. Assist in continous education to patient and family of care    Rehab Goals Patient on target to meet rehab goals: Yes Rehab Goals Revised: none - pt was just evaluated on day of conference *See Care Plan and progress notes for long and short-term  goals.     Barriers to Discharge  Current Status/Progress Possible Resolutions Date Resolved   Physician    Medical stability;Wound Care;Weight bearing restrictions     See above  Therapies, follow labs, optimize DM meds      Nursing                  PT                    OT Decreased caregiver support;Home environment access/layout                SLP                SW                Discharge Planning/Teaching Needs:  Pt plans to return to his home where family is building a ramp and family/friends will assist pt, as needed.He has mod I w/c and supervision level  goals.  Family education to be offered closer to d/c.   Team Discussion:  Pt's WBC is elevated a little and he has acute blood loss anemia and may need a transfusion.  He is diabetic and Dr. Posey Pronto will adjust medications, as needed.  Pt has a wound VAC at amputation incision site.  He is continent and pain medications are appropriately providing pain relief.  Pt is min A for squat pivot tx, max a for sit to stand and toileting with mod I w/c level and some supervision level goals.  Pt is min A for bed mobility, min A txs and S to propel w/c 150'.  Pt with S/CGA car tx and gait goals, mod I w/c.  Revisions to Treatment Plan:  none    Continued Need for Acute Rehabilitation Level of Care: The patient requires daily medical management by a physician with specialized training in physical medicine and rehabilitation for the following conditions: Daily direction of a multidisciplinary physical rehabilitation program to ensure safe treatment while eliciting the highest outcome that is of practical value to the patient.: Yes Daily medical management of patient stability for increased activity during participation in an intensive rehabilitation regime.: Yes Daily analysis of laboratory values and/or radiology reports with any subsequent need for medication adjustment of medical intervention for : Post surgical problems;Diabetes problems;Wound care problems;Other   I attest that I was present, lead the team conference, and concur with the assessment and plan of the team.Team conference was held via web/ teleconference due to [place] - 19.   Chrishelle Zito, Silvestre Mesi 11/21/2018, 4:58 PM

## 2018-11-21 NOTE — Evaluation (Signed)
Physical Therapy Assessment and Plan  Patient Details  Name: Joseph [name] MRN: [id number] Date of Birth: 1946/03/03  PT Diagnosis: Abnormal posture, Difficulty walking, Impaired sensation, Muscle weakness and Pain in joint Rehab Potential: Good ELOS: 10-14 days   Today's Date: 11/21/2018 PT Individual Time: 0900-1000 PT Individual Time Calculation (min): 60 min    Problem List:  Patient Active Problem List   Diagnosis Date Noted  . Acute blood loss anemia   . Chronic diastolic congestive heart failure ([place])   . Diabetic peripheral neuropathy ([place])   . CKD (chronic kidney disease), stage II   . 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 Leukocytosis   . 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[place]) 11/20/2018  . Cellulitis of right lower extremity   . Atrial fibrillation ([place])   . Gangrene of right foot ([place])   . Diabetic polyneuropathy associated with type 2 diabetes mellitus ([place])   . PVD (peripheral vascular disease) ([place])   . Critical lower limb ischemia   . Sepsis with acute renal failure ([place]) 11/13/2018  . Acute renal failure superimposed on stage 2 chronic kidney disease ([place])   . Open wound of right foot   . 'light-for-dates' infant with signs of fetal malnutrition 10/17/2018  . DM (diabetes mellitus) ([place]) 07/02/2012  . Hypertension associated with diabetes ([place]) 07/02/2012    Past Medical History:  Past Medical History:  Diagnosis Date  . Diabetes mellitus type 2 in nonobese (HCC)   . Hypertension    Past Surgical History:  Past Surgical History:  Procedure Laterality Date  . AMPUTATION Right 11/16/2018   Procedure: RIGHT BELOW KNEE AMPUTATION;  Surgeon: Newt Minion, MD;  Location: [facility];  Service: Orthopedics;  Laterality: Right;  . HAND SURGERY      Assessment & Plan Clinical Impression: Patient is a 73 y.o. year old male with history of type 2 diabetes mellitus, hypertension,chronic diastolic congestive heart failure,CKD stage II. Per chart  review patient lives alone independent prior to admission. Presented 11/13/2018 with right foot ischemic changes with nausea. Patient reportedly had scraped his foot approximately 2 months ago while doing some tree work. X-rays revealed soft tissue ulcer about the fifth metatarsophagealjoint with soft tissue air. No osteomyelitis. MRI of the foot completed again gas in the fifth metatarsal head consistent with osteomyelitis. Follow-up orthopedic service Dr. Sharol Given initially with conservative care. Hospital course complicated by anemia hemoglobin 6.7 with gastroenterology consulted 11/14/2018 recommendations of colonoscopy of which patient refused and maintained on PPI with recommendation to transfuse if symptomatic. 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 Sharol Given. Hospital course pain management as well as leukocytosis 30,500 maintained on antibiotic therapy. Patient developed atrial fibrillation with runs of SVT cardiology services consulted 11/15/2018 and he did receive IV Lopressor and Cardizem with little response with IV amiodarone started and patient converted to sinus rhythm on the evening of 11/15/2018. Plan was to begin aspirin on discharge. Echocardiogram with ejection fraction of 55% moderate hypokinesis of the left ventricular, entire anterior septal wall. Patient placed on amiodarone 200 mg twice daily cardiac rate controlled. Leukocytosis improved 16,000 antibiotic therapy has been discontinued. Hemoglobin continues to stabilize 8.5. Therapy evaluations completed and patient was admitted for a comprehensive rehab program.  Patient transferred to CIR on 11/20/2018 .   Patient currently requires max with mobility secondary to muscle weakness and muscle joint tightness, decreased coordination and decreased standing balance, decreased balance strategies and difficulty  maintaining precautions.  Prior  to hospitalization, patient was independent  with mobility and lived with Alone in a   home.  Home access is  Stairs to enter, Ramped entrance(plans to build ramp this weekend).  Patient will benefit from skilled PT intervention to maximize safe functional mobility, minimize fall risk and decrease caregiver burden for planned discharge intermittent supervision/assist.  Anticipate patient will benefit from follow up [facility] at discharge.     Skilled Therapeutic Intervention Evaluation completed (see details above and below) with education on PT POC and goals and individual treatment initiated with focus on functional mobility, amputee education and transfers. Pt supine in bed upon PT arrival, agreeable to therapy tx and reports pain 6/10 in R distal limb, also reports mild pain above L knee cap. Pt donned pants with assist to loop LEs, pt transferred to sitting with min assist and performed lateral leans to pull pants over hips. Pt performed sit<>stand this session with RW and max assist from elevated bed height, pt unable to initiate stand pivot transfer and unable to initiate gait secondary to L LE weakness and pain. Pt performed squat pivot to w/c with min assist and propelled w/c x 150 ft using B UEs, instructed in techniques and w/c parts management. Pt performed car transfer this session with mod assist for squat pivot, cues for techniques and set up. Pt propelled w/c back to room and left seated in w/c with needs in reach and chair alarm set.    PT Evaluation Precautions/Restrictions Precautions Precautions: Fall Precaution Comments: wear R leg brace at all times Required Braces or Orthoses: Other Brace Other Brace: residual limb splint Restrictions Weight Bearing Restrictions: Yes RLE Weight Bearing: Non weight bearing General   Vital Signs  Pain Pain Assessment Pain Scale: 0-10 Pain Score: 0-No pain Home Living/Prior Functioning Home Living Available Help at Discharge: Family;Available  PRN/intermittently;Friend(s)(daughter, 2 sons and ex-wife will rotate to help pt and be with him most of the time) Home Access: Stairs to enter;Ramped entrance(plans to build ramp this weekend) Additional Comments: was out driving and independent with no AD prior to this  Lives With: Alone Prior Function Level of Independence: Independent with basic ADLs;Independent with gait;Independent with homemaking with ambulation  Able to Take Stairs?: Yes Driving: Yes Vocation: Retired Comments: driving, mowing his lawn, very active prior, enjoys riding [employer] Overall Cognitive Status: Within Functional Limits for tasks assessed Orientation Level: Oriented X4 Attention: Focused;Sustained Focused Attention: Appears intact Sustained Attention: Appears intact Awareness: Appears intact Safety/Judgment: Appears intact Sensation Sensation Light Touch: Appears Intact Proprioception: Appears Intact Additional Comments: sensation grossly intact except near R limb incision Coordination Gross Motor Movements are Fluid and Coordinated: No Fine Motor Movements are Fluid and Coordinated: No Coordination and Movement Description: LE coordination impaired secondary to R BKA and pain Motor  Motor Motor: Within Functional Limits Motor - Skilled Clinical Observations: generalized weakness  Mobility Bed Mobility Bed Mobility: Rolling Right;Rolling Left;Sit to Supine;Supine to Sit Rolling Right: Supervision/verbal cueing Rolling Left: Supervision/Verbal cueing Supine to Sit: Minimal Assistance - Patient > 75% Sit to Supine: Minimal Assistance - Patient > 75% Transfers Transfers: Sit to Stand;Stand to Sit;Squat Pivot Transfers Sit to Stand: Maximal Assistance - Patient 25-49% Stand to Sit: Maximal Assistance - Patient 25-49% Squat Pivot Transfers: Minimal Assistance - Patient > 75% Transfer (Assistive device): None Locomotion  Gait Ambulation: No(giat unable on eval secondary to pain and  weakness) Stairs / Additional Locomotion Stairs: No Wheelchair Mobility Wheelchair Mobility: Yes Wheelchair Assistance: Supervision/Verbal  cueing Wheelchair Propulsion: Both upper extremities Wheelchair Parts Management: Needs assistance Distance: 150 ft  Trunk/Postural Assessment  Cervical Assessment Cervical Assessment: Within Functional Limits Thoracic Assessment Thoracic Assessment: Within Functional Limits Lumbar Assessment Lumbar Assessment: Exceptions to WFL(posterior pelvic tilt in sitting) Postural Control Postural Control: Within Functional Limits  Balance Balance Balance Assessed: Yes Static Sitting Balance Static Sitting - Level of Assistance: 5: Stand by assistance Dynamic Sitting Balance Dynamic Sitting - Level of Assistance: 5: Stand by assistance Static Standing Balance Static Standing - Level of Assistance: 4: Min assist Dynamic Standing Balance Dynamic Standing - Level of Assistance: 3: Mod assist Extremity Assessment  RLE Assessment RLE Assessment: Exceptions to [facility] Passive Range of Motion (PROM) Comments: hip extension to neutral General Strength Comments: grossly 3- to 4/5 at the hip and knee LLE Assessment LLE Assessment: Exceptions to [facility] Passive Range of Motion (PROM) Comments: tight hamstrings/heel cords General Strength Comments: hip flexion 3/5, hip extension 3-/5, 4/5 knee extension/flexion and 4/5 at the ankle    Refer to Care Plan for Long Term Goals  Recommendations for other services: Neuropsych  Discharge Criteria: Patient will be discharged from PT if patient refuses treatment 3 consecutive times without medical reason, if treatment goals not met, if there is a change in medical status, if patient makes no progress towards goals or if patient is discharged from hospital.  The above assessment, treatment plan, treatment alternatives and goals were discussed and mutually agreed upon: by patient  Netta Corrigan, PT, DPT 11/21/2018,  9:34 AM

## 2018-11-21 NOTE — Progress Notes (Signed)
Patient information reviewed and entered into eRehab System by Becky Elexa Kivi, PPS coordinator. Information including medical coding, function ability, and quality indicators will be reviewed and updated through discharge.   

## 2018-11-21 NOTE — Progress Notes (Signed)
[place] PHYSICAL MEDICINE & REHABILITATION PROGRESS NOTE  Subjective/Complaints: Patient seen sitting up in bed this morning.  He states he slept well after adjusting the temperature in his room.  He is ready begin therapies.  ROS: Denies CP, shortness of breath, nausea, vomiting, diarrhea.  Objective: Vital Signs: Blood pressure 129/76, pulse 75, temperature 99.2 F (37.3 C), resp. rate 17, height 6\' 4"  (1.93 m), weight 93.6 kg, SpO2 97 %. No results found. Recent Labs    11/19/18 0457 11/21/18 0622  WBC 16.2* 13.2*  HGB 8.5* 7.9*  HCT 26.4* 24.9*  PLT 373 412*   Recent Labs    11/20/18 1014 11/21/18 0622  NA 135 135  K 4.6 4.3  CL 101 101  CO2 25 27  GLUCOSE 168* 111*  BUN 21 20  CREATININE 1.40* 1.22  CALCIUM 8.8* 8.7*    Physical Exam: BP 129/76 (BP Location: Left Arm)   Pulse 75   Temp 99.2 F (37.3 C)   Resp 17   Ht 6\' 4"  (1.93 m)   Wt 93.6 kg   SpO2 97%   BMI 25.12 kg/m  Constitutional: No distress . Vital signs reviewed. HENT: Normocephalic.  Atraumatic. Eyes: EOMI. No discharge. Cardiovascular: No JVD. Respiratory: Normal effort. GI: Non-distended. Musc: Right stump with tenderness Neurological: Alert and oriented Motor: Bilateral upper extremities: 5/5 proximal distal Left lower extremity: 3/5 proximal to distal. Right lower extremity: Hip flexion 4-/5 hip flexion Sensation diminished to light touch left foot Skin: + Back right stump  Psychiatric: He has a normal mood and affect. Hisbehavior is normal.Thought contentnormal.   Assessment/Plan: 1. Functional deficits secondary to right BKA which require 3+ hours per day of interdisciplinary therapy in a comprehensive inpatient rehab setting.  Physiatrist is providing close team supervision and 24 hour management of active medical problems listed below.  Physiatrist and rehab team continue to assess barriers to discharge/monitor patient progress toward functional and medical  goals  Care Tool:  Bathing              Bathing assist       Upper Body Dressing/Undressing Upper body dressing        Upper body assist      Lower Body Dressing/Undressing Lower body dressing            Lower body assist       Toileting Toileting    Toileting assist Assist for toileting: Independent with assistive device     Transfers Chair/bed transfer  Transfers assist           Locomotion Ambulation   Ambulation assist              Walk 10 feet activity   Assist           Walk 50 feet activity   Assist           Walk 150 feet activity   Assist           Walk 10 feet on uneven surface  activity   Assist           Wheelchair     Assist               Wheelchair 50 feet with 2 turns activity    Assist            Wheelchair 150 feet activity     Assist            Medical Problem List and Plan: 1.Decreased  functional mobilitysecondary to ischemic right lower extremity status post right BKA 11/16/2018  Begin CIR evaluations  Notes reviewed- ischemic extremity status post amputation, labs reviewed  Team conference today to discuss current and goals and coordination of care, home and environmental barriers, and discharge planning with nursing, case manager, and therapies.  2. Antithrombotics: -DVT/anticoagulation:SCDs left lower extremity -antiplatelet therapy: Await plan to begin aspirin 3. Pain Management:Neurontin 300 mg 3 times daily, Robaxin and oxycodone as needed 4. Mood:Provide emotional support -antipsychotic agents: N/A 5. Neuropsych: This patientiscapable of making decisions on hisown behalf. 6. Skin/Wound Care:  Plan to DC VAC on 6/26 7. Fluids/Electrolytes/Nutrition:Routine in and out 8.Acute blood loss anemia.   Patient refused any GI work-up on acute floor.  Hemoglobin 7.9 on 6/24, continues to trend  down  Continue to monitor 9. Acute onset atrial fibrillation. Amiodarone 200 mg twice daily. Cardiology follow-up 10. Chronic diastolic congestive heart failure. Lasix 40 mg daily Filed Weights   11/20/18 1734 11/21/18 0330  Weight: 93.3 kg 93.6 kg   11. Diabetes mellitus with peripheral neuropathy. Hemoglobin A1c 5.7. Glucotrol 5 mg daily,NovoLog 3 units 3 times daily, Levemir 8 units nightly. Check blood sugars before meals and at bedtime  Monitor with increased mobility 12. CKD stage II. Follow-up chemistries on admit  Creatinine 1.22 on 6/24  Continue to monitor 13. Constipation. Laxative assistance 14.  Hypoalbuminemia  Supplement initiated on 6/24 15.  Leukocytosis  WBC 13.2 on 6/24  Afebrile  Continue to monitor  LOS: 1 days A FACE TO FACE EVALUATION WAS PERFORMED  Delos Klich [name] 11/21/2018, 8:55 AM

## 2018-11-21 NOTE — Evaluation (Signed)
Occupational Therapy Assessment and Plan  Patient Details  Name: Joseph [name] MRN: [id number] Date of Birth: 1946-02-14  OT Diagnosis: abnormal posture, muscle weakness (generalized) and pain in joint Rehab Potential:   ELOS: 10-14   Today's Date: 11/21/2018 OT Individual Time: 0240-9735 OT Individual Time Calculation (min): 64 min     Problem List:  Patient Active Problem List   Diagnosis Date Noted  . Acute blood loss anemia   . Chronic diastolic congestive heart failure ([place])   . Diabetic peripheral neuropathy ([place])   . CKD (chronic kidney disease), stage II   . Hypoalbuminemia due to protein-calorie malnutrition ([place])   . Leukocytosis   . 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[place]) 11/20/2018  . Cellulitis of right lower extremity   . Atrial fibrillation ([place])   . Gangrene of right foot ([place])   . Diabetic polyneuropathy associated with type 2 diabetes mellitus ([place])   . PVD (peripheral vascular disease) ([place])   . Critical lower limb ischemia   . Sepsis with acute renal failure ([place]) 11/13/2018  . Acute renal failure superimposed on stage 2 chronic kidney disease ([place])   . Open wound of right foot   . 'light-for-dates' infant with signs of fetal malnutrition 10/17/2018  . DM (diabetes mellitus) ([place]) 07/02/2012  . Hypertension associated with diabetes ([place]) 07/02/2012    Past Medical History:  Past Medical History:  Diagnosis Date  . Diabetes mellitus type 2 in nonobese (H[facility])   . Hypertension    Past Surgical History:  Past Surgical History:  Procedure Laterality Date  . AMPUTATION Right 11/16/2018   Procedure: RIGHT BELOW KNEE AMPUTATION;  Surgeon: Newt Minion, MD;  Location: [facility];  Service: Orthopedics;  Laterality: Right;  . HAND SURGERY      Assessment & Plan Clinical Impression:Joseph [name] a 73 year old right-handed male with history of type 2 diabetes mellitus, hypertension,chronic diastolic congestive heart failure,CKD stage II. Per chart review patient lives  alone independent prior to admission. Presented 11/13/2018 with right foot ischemic changes with nausea. Patient reportedly had scraped his foot approximately 2 months ago while doing some tree work. X-rays revealed soft tissue ulcer about the fifth metatarsophagealjoint with soft tissue air. No osteomyelitis. MRI of the foot completed again gas in the fifth metatarsal head consistent with osteomyelitis. Follow-up orthopedic service Dr. Sharol Given initially with conservative care. Hospital course complicated by anemia hemoglobin 6.7 with gastroenterology consulted 11/14/2018 recommendations of colonoscopy of which patient refused and maintained on PPI with recommendation to transfuse if symptomatic. 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 Sharol Given. Hospital course pain management as well as leukocytosis 30,500 maintained on antibiotic therapy. Patient developed atrial fibrillation with runs of SVT cardiology services consulted 11/15/2018 and he did receive IV Lopressor and Cardizem with little response with IV amiodarone started and patient converted to sinus rhythm on the evening of 11/15/2018. Plan was to begin aspirin on discharge. Echocardiogram with ejection fraction of 55% moderate hypokinesis of the left ventricular, entire anterior septal wall. Patient placed on amiodarone 200 mg twice daily cardiac rate controlled. Leukocytosis improved 16,000 antibiotic therapy has been discontinued. Hemoglobin continues to stabilize 8.5.   Patient currently requires mod with basic self-care skills secondary to muscle weakness, decreased cardiorespiratoy endurance and decreased standing balance, decreased postural control and decreased balance strategies.  Prior to hospitalization, patient could complete BADL/IADL with independent .  Patient will benefit from skilled intervention to increase independence with basic  self-care skills prior to discharge home with care partner.  Anticipate patient will require intermittent supervision and follow up home health.  OT - End of Session Activity Tolerance: Tolerates 30+ min activity with multiple rests Endurance Deficit: Yes OT Assessment Rehab Potential (ACUTE ONLY): Good OT Barriers to Discharge: Decreased caregiver support;Home environment access/layout OT Patient demonstrates impairments in the following area(s): Balance;Endurance;Pain;Safety OT Basic ADL's Functional Problem(s): Grooming;Bathing;Dressing;Toileting OT Advanced ADL's Functional Problem(s): Simple Meal Preparation OT Transfers Functional Problem(s): Toilet;Tub/Shower OT Plan OT Intensity: Minimum of 1-2 x/day, 45 to 90 minutes OT Frequency: 5 out of 7 days OT Duration/Estimated Length of Stay: 10-14 OT Treatment/Interventions: Balance/vestibular training;Discharge planning;Pain management;Self Care/advanced ADL retraining;Therapeutic Activities;UE/LE Coordination activities;Disease mangement/prevention;Functional mobility training;Patient/family education;Skin care/wound managment;Therapeutic Exercise;Community reintegration;DME/adaptive equipment instruction;Neuromuscular re-education;Splinting/orthotics;Psychosocial support;UE/LE Strength taining/ROM;Wheelchair propulsion/positioning OT Self Feeding Anticipated Outcome(s): no goal OT Basic Self-Care Anticipated Outcome(s): MOD I OT Toileting Anticipated Outcome(s): MOD I toilet, S shower OT Bathroom Transfers Anticipated Outcome(s): MOD I toilet; S Shower OT Recommendation Patient destination: Home Follow Up Recommendations: Home health OT Equipment Recommended: 3 in 1 bedside comode;Tub/shower bench   Skilled Therapeutic Intervention 1:1. Pt received in w/c. Pt educated on role/purpose of OT, CIR, ELOS and POC. Pt requesting to use toilet for BM. Pt completes squat pivot transfer with MIN A w/c<>DAC over toilet using grab bar and VC for  hand placement. Pt requires MAX A for toileting completing hygiene seated using lateral leans. Pt completes squat pivot transfer with MIN A to EOB with similar cuing as above. Pt completes UB bathing with set up and LB bathing with VC for lateral lean techqniue and A to wash L foot. Pt dons shirt with set up and pants with MAX A. Total A to don R sock. Exited session with pt seated EOB, alarm ona nd call light in reach  OT Evaluation Precautions/Restrictions  Precautions Precautions: Fall Precaution Comments: wear R leg brace at all times Required Braces or Orthoses: Other Brace Other Brace: residual limb splint Restrictions Weight Bearing Restrictions: Yes RLE Weight Bearing: Non weight bearing General Chart Reviewed: Yes Family/Caregiver Present: No Vital Signs   Pain Pain Assessment Pain Scale: 0-10 Pain Score: 6  Pain Type: Acute pain;Neuropathic pain Pain Location: Leg Pain Orientation: Right;Left(mild swelling above L knee cap, pain) Pain Descriptors / Indicators: Aching Pain Frequency: Intermittent Pain Intervention(s): Medication (See eMAR) Home Living/Prior Functioning Home Living Available Help at Discharge: Family, Available PRN/intermittently, Friend(s) Type of Home: House Home Access: Stairs to enter, Ramped entrance Home Layout: Other (Comment)(split level home) Bathroom Toilet: Standard Additional Comments: was out driving and independent with no AD prior to this  Lives With: Alone IADL History Homemaking Responsibilities: Yes Meal Prep Responsibility: Primary Laundry Responsibility: Primary Cleaning Responsibility: Primary Bill Paying/Finance Responsibility: Primary Shopping Responsibility: Primary Prior Function Level of Independence: Independent with basic ADLs, Independent with gait, Independent with homemaking with ambulation  Able to Take Stairs?: Yes Driving: Yes Vocation: Retired Comments: driving, mowing his lawn, very active prior, enjoys  riding motorcycle ADL ADL Eating: Independent Grooming: Setup Where Assessed-Grooming: Edge of bed Upper Body Bathing: Supervision/safety Where Assessed-Upper Body Bathing: Edge of bed Lower Body Bathing: Supervision/safety Upper Body Dressing: Supervision/safety Where Assessed-Upper Body Dressing: Edge of bed Where Assessed-Lower Body Dressing: Edge of bed Toileting: Maximal cueing Where Assessed-Toileting: Bedside Commode Toilet Transfer Method: Ambulating Vision Vision Assessment?: No apparent visual deficits Perception  Perception: Within Functional Limits Praxis Praxis: Intact Cognition Overall Cognitive Status: Within Functional Limits for tasks assessed Attention: Focused;Sustained Focused Attention: Appears  intact Sustained Attention: Appears intact Awareness: Appears intact Safety/Judgment: Appears intact Sensation Sensation Light Touch: Appears Intact Proprioception: Appears Intact Additional Comments: sensation grossly intact except near R limb incision Coordination Gross Motor Movements are Fluid and Coordinated: No Fine Motor Movements are Fluid and Coordinated: No Coordination and Movement Description: LE coordination impaired secondary to R BKA and pain Motor  Motor Motor: Within Functional Limits Motor - Skilled Clinical Observations: generalized weakness Mobility  Bed Mobility Bed Mobility: Rolling Right;Rolling Left;Sit to Supine;Supine to Sit Rolling Right: Supervision/verbal cueing Rolling Left: Supervision/Verbal cueing Supine to Sit: Minimal Assistance - Patient > 75% Sit to Supine: Minimal Assistance - Patient > 75% Transfers Sit to Stand: Maximal Assistance - Patient 25-49% Stand to Sit: Maximal Assistance - Patient 25-49%  Trunk/Postural Assessment  Cervical Assessment Cervical Assessment: Within Functional Limits Thoracic Assessment Thoracic Assessment: Within Functional Limits Lumbar Assessment Lumbar Assessment: Exceptions to  WFL(post pelvic tilt) Postural Control Postural Control: Within Functional Limits  Balance Balance Balance Assessed: Yes Static Sitting Balance Static Sitting - Level of Assistance: 5: Stand by assistance Dynamic Sitting Balance Dynamic Sitting - Level of Assistance: 5: Stand by assistance Static Standing Balance Static Standing - Level of Assistance: 4: Min assist Dynamic Standing Balance Dynamic Standing - Level of Assistance: 3: Mod assist Extremity/Trunk Assessment RUE Assessment RUE Assessment: Within Functional Limits LUE Assessment LUE Assessment: Within Functional Limits     Refer to Care Plan for Long Term Goals  Recommendations for other services: Therapeutic Recreation  Stress management   Discharge Criteria: Patient will be discharged from OT if patient refuses treatment 3 consecutive times without medical reason, if treatment goals not met, if there is a change in medical status, if patient makes no progress towards goals or if patient is discharged from hospital.  The above assessment, treatment plan, treatment alternatives and goals were discussed and mutually agreed upon: by patient  Tonny Branch 11/21/2018, 11:03 AM

## 2018-11-21 NOTE — Progress Notes (Signed)
Occupational Therapy Session Note  Patient Details  Name: Joseph [name] MRN: [id number] Date of Birth: 07/14/1945  Today's Date: 11/21/2018 OT Individual Time: 1400-1427 OT Individual Time Calculation (min): 27 min    Short Term Goals: Week 1:  OT Short Term Goal 1 (Week 1): Pt will sit to stand wit MOD A in prep for cloting management OT Short Term Goal 2 (Week 1): Pt will transfer wiht S to toilet wiht LRAD OT Short Term Goal 3 (Week 1): Pt will manage pants past hips prior to toileting wiht CGA OT Short Term Goal 4 (Week 1): Pt will thread BLE into pants wiht AE PRn  Skilled Therapeutic Interventions/Progress Updates:  1:1. Pt received in bed reporting need to urinate. Pt sits EOB with VC for technique. Pt completes urinal use, but spills over floor attempting to place on table. OT cleanses floor for safety. Pt completes supervision squat pivot transfer with VC for hand placement. Pt propels w/c to ADL apartment and OT educates on adaptations and DME. Pt going to ask for pictures and measurements of doorways. Pt discussing set up of home and d/c planning. Exited session with pt seated in w/c, ice applied to knee for "mild" pain and call light in reach  Session 2 Today's Date: 11/21/2018 OT Individual Time: 1455-1555 OT Individual Time Calculation (min):  60 min   1:1. Pt received in w/c with NT present. Pt completes w/c mobility to all tx places with supervision. OT switches out w/c to 20x18 with hard back for more support and improved posture. Pt completes 2x10 w/c push ups with VC for trunk flexion to improve BUE strengthening required for functional transfers and sit to stand. Pt educated on sock aide/reacher to doff/don socks. Pt able to return demo with supervision. Pt completes shower transfer with TTB with CGA and VC for sequencing. Exited session with pt seated in bed, exit alar on, call light in reach and all needs met  Therapy Documentation Precautions:  Precautions Precautions:  Fall Precaution Comments: wear R leg brace at all times Required Braces or Orthoses: Other Brace Other Brace: residual limb splint Restrictions Weight Bearing Restrictions: Yes RLE Weight Bearing: Non weight bearing General: General Chart Reviewed: Yes Family/Caregiver Present: No Vital Signs:   Pain: Pain Assessment Pain Score: 2  ADL: ADL Eating: Independent Grooming: Setup Where Assessed-Grooming: Edge of bed Upper Body Bathing: Supervision/safety Where Assessed-Upper Body Bathing: Edge of bed Lower Body Bathing: Supervision/safety Upper Body Dressing: Supervision/safety Where Assessed-Upper Body Dressing: Edge of bed Where Assessed-Lower Body Dressing: Edge of bed Toileting: Maximal cueing Where Assessed-Toileting: Bedside Commode Toilet Transfer Method: Ambulating Vision Vision Assessment?: No apparent visual deficits Perception  Perception: Within Functional Limits Praxis Praxis: Intact Exercises:   Other Treatments:     Therapy/Group: Individual Therapy  Stephanie M Schlosser 11/21/2018, 2:30 PM  

## 2018-11-22 ENCOUNTER — Inpatient Hospital Stay (HOSPITAL_COMMUNITY): Payer: Medicare Other | Admitting: Occupational Therapy

## 2018-11-22 ENCOUNTER — Inpatient Hospital Stay (HOSPITAL_COMMUNITY): Payer: Medicare Other | Admitting: Physical Therapy

## 2018-11-22 ENCOUNTER — Inpatient Hospital Stay (HOSPITAL_COMMUNITY): Payer: Medicare Other

## 2018-11-22 ENCOUNTER — Inpatient Hospital Stay (HOSPITAL_COMMUNITY): Payer: Medicare Other | Admitting: *Deleted

## 2018-11-22 LAB — GLUCOSE, CAPILLARY
Glucose-Capillary: 114 mg/dL — ABNORMAL HIGH (ref 70–99)
Glucose-Capillary: 62 mg/dL — ABNORMAL LOW (ref 70–99)
Glucose-Capillary: 62 mg/dL — ABNORMAL LOW (ref 70–99)
Glucose-Capillary: 72 mg/dL (ref 70–99)
Glucose-Capillary: 111 mg/dL — ABNORMAL HIGH (ref 70–99)
Glucose-Capillary: 149 mg/dL — ABNORMAL HIGH (ref 70–99)

## 2018-11-22 NOTE — Progress Notes (Signed)
Occupational Therapy Session Note  Patient Details  Name: [name] Enerson MRN: [id number] Date of Birth: [birth year]  Today's Date: 11/22/2018  Session 1 OT Individual Time: 1937-9024 OT Individual Time Calculation (min): 42 min   Session 2 OT Individual Time: 0973-5329 OT Individual Time Calculation (min): 71 min    Short Term Goals: Week 1:  OT Short Term Goal 1 (Week 1): Pt will sit to stand wit MOD A in prep for cloting management OT Short Term Goal 2 (Week 1): Pt will transfer wiht S to toilet wiht LRAD OT Short Term Goal 3 (Week 1): Pt will manage pants past hips prior to toileting wiht CGA OT Short Term Goal 4 (Week 1): Pt will thread BLE into pants wiht AE PRn  Skilled Therapeutic Interventions/Progress Updates:  Session 1   Pt greeted semi-reclined in bed with nursing administering medications. Pt  Came to sitting EOB with increased time and min A. Pt completed bathing/dressing seated EOB using leaning method to doff buttocks and wash peri-area. OT assisted with threading pant legs over wound vac, then pt able to thread R LE into pant legs. Leaning method to pull pants up 75%, then pt able to squat to lift bottom up for OT to assist with pulling up over R hip. Lateral scoot to wc with CGA. Pt completed grooming tasks sitting in wc at the sink. Worked on wc mobility with wc propulsion to nurses station and back to room. Pt left seated in wc with alarm belt on and needs met.   Session 2 Pt greeted seated in wc after finishing PT session. Nursing entered to administer pain medication, then pt agreeable to OT treatment session. Worked on wc propulsion to therapy gym with increased time and supervision. UB there-ex with 15 mins on [employer] arm bike on level 3. Pt then completed squat-pivot to therapy mat. Rec therapist entered session and discussed goals for leisure activities. OT provided pt with TED hose and tennis shoe which pt needed assistance to don both. Worked on sit<>stands and  standing endurace from raised therapy mat. Pt needed mod A for initial stand and pulled up on RW. Second stand, pt able to push from therapy mat with one hand on RW and stand with  Min A. Pt tolerated standing for 2, 66mnute intervals. OT provided pt with 2 lb dowl rod and completed UB there-ex 3 sets of 10, bicep curls, straight arm raises, and chest press. Pt transferred back to wc in similar fashion and propelled wc back to room with increased time and supervision. Pt left with alarm belt on and needs met.   Therapy Documentation Precautions:  Precautions Precautions: Fall Precaution Comments: wear R leg brace at all times Required Braces or Orthoses: Other Brace Other Brace: residual limb splint Restrictions Weight Bearing Restrictions: (P) Yes RLE Weight Bearing: (P) Non weight bearing Pain: Pain Assessment Pain Scale: 0-10 Pain Score: 6  Pain Type: Acute pain;Surgical pain Pain Location: Leg Pain Orientation: Right Pain Radiating Towards: stump site Pain Descriptors / Indicators: Burning Pain Frequency: Constant Pain Onset: On-going Pain Intervention(s): Repositioned   Therapy/Group: Individual Therapy  EValma Cava6/25/2020, 12:49 PM

## 2018-11-22 NOTE — Progress Notes (Signed)
Occupational Therapy Session Note  Patient Details  Name: Joseph [name] MRN: [id number] Date of Birth: 1945/06/14  Today's Date: 11/22/2018 OT Individual Time: 1304-1330 OT Individual Time Calculation (min): 26 min    Short Term Goals: Week 1:  OT Short Term Goal 1 (Week 1): Pt will sit to stand wit MOD A in prep for cloting management OT Short Term Goal 2 (Week 1): Pt will transfer wiht S to toilet wiht LRAD OT Short Term Goal 3 (Week 1): Pt will manage pants past hips prior to toileting wiht CGA OT Short Term Goal 4 (Week 1): Pt will thread BLE into pants wiht AE PRn  Skilled Therapeutic Interventions/Progress Updates:    1;1. no pain reported. Pt finishing meal seated in w/c d/t low blood sugar. Pt completes w/c mobility to/from tx spaces for BUE endurance. Pt completes beach ball volley with 3# dowel rod seated in w/c for BUE strengthening and endurance required for BADLs/mobility. Pt able to completes 4c1.5 min round intervals prior to needing rest break. Exited session with ptseated in w/c, call light inr each and belt alarm on  Therapy Documentation Precautions:  Precautions Precautions: Fall Precaution Comments: wear R leg brace at all times Required Braces or Orthoses: Other Brace Other Brace: residual limb splint Restrictions Weight Bearing Restrictions: (P) Yes RLE Weight Bearing: (P) Non weight bearing General:   Vital Signs:   Pain: Pain Assessment Pain Scale: 0-10 Pain Score: 8  Pain Type: Acute pain;Surgical pain Pain Location: Leg Pain Orientation: Right Pain Radiating Towards: stump site Pain Descriptors / Indicators: Burning Pain Frequency: Constant Pain Onset: On-going Patients Stated Pain Goal: 5 Pain Intervention(s): Medication (See eMAR)(oxycodone given) Multiple Pain Sites: No ADL: ADL Eating: Independent Grooming: Setup Where Assessed-Grooming: Edge of bed Upper Body Bathing: Supervision/safety Where Assessed-Upper Body Bathing: Edge of  bed Lower Body Bathing: Supervision/safety Upper Body Dressing: Supervision/safety Where Assessed-Upper Body Dressing: Edge of bed Where Assessed-Lower Body Dressing: Edge of bed Toileting: Maximal cueing Where Assessed-Toileting: Bedside Commode Toilet Transfer Method: Ambulating Vision   Perception    Praxis   Exercises:   Other Treatments:     Therapy/Group: Individual Therapy  Tonny Branch 11/22/2018, 1:00 PM

## 2018-11-22 NOTE — Plan of Care (Signed)
  Problem: Consults Goal: RH GENERAL PATIENT EDUCATION Description: See Patient Education module for education specifics. Outcome: Progressing Goal: Skin Care Protocol Initiated - if Braden Score 18 or less Description: If consults are not indicated, leave blank or document N/A Outcome: Progressing Goal: Nutrition Consult-if indicated Description: Meal intake greater than 60% Outcome: Progressing Goal: Diabetes Guidelines if Diabetic/Glucose > 140 Description: If diabetic or lab glucose is > 140 mg/dl - Initiate Diabetes/Hyperglycemia Guidelines & Document Interventions  Outcome: Progressing   Problem: RH SKIN INTEGRITY Goal: RH STG SKIN FREE OF INFECTION/BREAKDOWN Description: Assess incision site every shift. Dressing change per order. Outcome: Progressing   Problem: RH PAIN MANAGEMENT Goal: RH STG PAIN MANAGED AT OR BELOW PT'S PAIN GOAL Description: Pain level less than 5 on scale 0-10 Outcome: Progressing   Problem: RH KNOWLEDGE DEFICIT GENERAL Goal: RH STG INCREASE KNOWLEDGE OF SELF CARE AFTER HOSPITALIZATION Description: Pt will be able to demonstrate dressing change to stump site.  Outcome: Progressing

## 2018-11-22 NOTE — Progress Notes (Signed)
Hypoglycemic Event  CBG: 62 at 1211  Treatment: 222 mL of coca-cola, graham crackers, pt eating.   Symptoms:none  Follow-up CBG: Time: 1230 and 1257   CBG Result: 62 at 1230, and 72 at 1257.   Possible Reasons for Event: unknown.   Comments/MD notified: protocol followed.    Caralee Morea W Amberly Livas

## 2018-11-22 NOTE — Evaluation (Signed)
Recreational Therapy Assessment and Plan  Patient Details  Name: Joseph [name] MRN: [id number] Date of Birth: 02-08-1946 Today's Date: 11/22/2018  Rehab Potential: Good ELOS: 2 weeks   Assessment  Problem List:      Patient Active Problem List   Diagnosis Date Noted  . Acute blood loss anemia   . Chronic diastolic congestive heart failure ([place])   . Diabetic peripheral neuropathy ([place])   . CKD (chronic kidney disease), stage II   . Hypoalbuminemia due to protein-calorie malnutrition ([place])   . Leukocytosis   . 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[place]) 11/20/2018  . Cellulitis of right lower extremity   . Atrial fibrillation ([place])   . Gangrene of right foot ([place])   . Diabetic polyneuropathy associated with type 2 diabetes mellitus ([place])   . PVD (peripheral vascular disease) ([place])   . Critical lower limb ischemia   . Sepsis with acute renal failure ([place]) 11/13/2018  . Acute renal failure superimposed on stage 2 chronic kidney disease ([place])   . Open wound of right foot   . 'light-for-dates' infant with signs of fetal malnutrition 10/17/2018  . DM (diabetes mellitus) ([place]) 07/02/2012  . Hypertension associated with diabetes ([place]) 07/02/2012    Past Medical History:      Past Medical History:  Diagnosis Date  . Diabetes mellitus type 2 in nonobese (HCC)   . Hypertension    Past Surgical History:       Past Surgical History:  Procedure Laterality Date  . AMPUTATION Right 11/16/2018   Procedure: RIGHT BELOW KNEE AMPUTATION;  Surgeon: Newt Minion, MD;  Location: [place];  Service: Orthopedics;  Laterality: Right;  . HAND SURGERY      Assessment & Plan Clinical Impression:Joseph Poeis a 73 year old right-handed male with history of type 2 diabetes mellitus, hypertension,chronic diastolic congestive heart failure,CKD stage II. Per chart review patient lives alone independent prior to admission. Presented 11/13/2018 with right foot ischemic changes with nausea.  Patient reportedly had scraped his foot approximately 2 months ago while doing some tree work. X-rays revealed soft tissue ulcer about the fifth metatarsophagealjoint with soft tissue air. No osteomyelitis. MRI of the foot completed again gas in the fifth metatarsal head consistent with osteomyelitis. Follow-up orthopedic service Dr. Sharol Given initially with conservative care. Hospital course complicated by anemia hemoglobin 6.7 with gastroenterology consulted 11/14/2018 recommendations of colonoscopy of which patient refused and maintained on PPI with recommendation to transfuse if symptomatic. 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 Sharol Given. Hospital course pain management as well as leukocytosis 30,500 maintained on antibiotic therapy. Patient developed atrial fibrillation with runs of SVT cardiology services consulted 11/15/2018 and he did receive IV Lopressor and Cardizem with little response with IV amiodarone started and patient converted to sinus rhythm on the evening of 11/15/2018. Plan was to begin aspirin on discharge. Echocardiogram with ejection fraction of 55% moderate hypokinesis of the left ventricular, entire anterior septal wall. Patient placed on amiodarone 200 mg twice daily cardiac rate controlled. Leukocytosis improved 16,000 antibiotic therapy has been discontinued. Hemoglobin continues to stabilize 8.5.   Pt presents with decreased activity tolerance, decreased functional mobility, decreased balance Limiting pt's independence with leisure/community pursuits.   Leisure History/Participation Premorbid leisure interest/current participation: [employer];Community - Travel (Comment)(riding his [employer] fast streamer) Expression Interests: Music (Comment) Other Leisure Interests: Television Leisure Participation Style: Alone;With Family/Friends Awareness of  Community Resources: Good-identify 3 post discharge leisure resources  Psychosocial / Spiritual Social interaction - Mood/Behavior: Cooperative Academic librarian Appropriate for Education?: Yes Recreational Therapy Orientation Orientation -Reviewed with patient: Available activity resources Strengths/Weaknesses Patient Strengths/Abilities: Willingness to participate;Active premorbidly Patient weaknesses: Physical limitations TR Patient demonstrates impairments in the following area(s): Edema;Endurance;Motor;Pain;Skin Integrity;Safety  Plan Rec Therapy Plan Is patient appropriate for Therapeutic Recreation?: Yes Rehab Potential: Good Treatment times per week: Min 1 TR session >20 minutes per week during LOS. Estimated Length of Stay: 2 weeks TR Treatment/Interventions: Adaptive equipment instruction;Community reintegration;Patient/family education;Therapeutic exercise;1:1 session;Functional mobility training;Balance/vestibular training;Recreation/leisure participation;Therapeutic activities;Wheelchair propulsion/positioning  Recommendations for other services: None   Discharge Criteria: Patient will be discharged from TR if patient refuses treatment 3 consecutive times without medical reason.  If treatment goals not met, if there is a change in medical status, if patient makes no progress towards goals or if patient is discharged from hospital.  The above assessment, treatment plan, treatment alternatives and goals were discussed and mutually agreed upon: by patient  [place] 11/22/2018, 12:49 PM

## 2018-11-22 NOTE — Progress Notes (Signed)
PHYSICAL MEDICINE & REHABILITATION PROGRESS NOTE  Subjective/Complaints: Patient seen sitting up in bed this morning.  He states he slept well overnight.  He states he had a good first day of therapies yesterday.  He is questions regarding recovery.  ROS: Denies CP, shortness of breath, nausea, vomiting, diarrhea.  Objective: Vital Signs: Blood pressure 120/72, pulse 77, temperature 99.3 F (37.4 C), temperature source Oral, resp. rate 18, height 6\' 4"  (1.93 m), weight 91.4 kg, SpO2 96 %. No results found. Recent Labs    11/21/18 0622  WBC 13.2*  HGB 7.9*  HCT 24.9*  PLT 412*   Recent Labs    11/20/18 1014 11/21/18 0622  NA 135 135  K 4.6 4.3  CL 101 101  CO2 25 27  GLUCOSE 168* 111*  BUN 21 20  CREATININE 1.40* 1.22  CALCIUM 8.8* 8.7*    Physical Exam: BP 120/72 (BP Location: Left Arm)   Pulse 77   Temp 99.3 F (37.4 C) (Oral)   Resp 18   Ht 6\' 4"  (1.93 m)   Wt 91.4 kg   SpO2 96%   BMI 24.53 kg/m  Constitutional: No distress . Vital signs reviewed. HENT: Normocephalic.  Atraumatic. Eyes: EOMI.  No discharge. Cardiovascular: No JVD. Respiratory: Normal effort. GI: Non-distended. Musc: Right stump with tenderness, stable Neurological: Alert and oriented Motor: Bilateral upper extremities: 5/5 proximal distal Left lower extremity: 3/5 proximal to distal, unchanged. Right lower extremity: Hip flexion 4-/5 hip flexion, unchanged Sensation diminished to light touch left foot Skin: + VAC right stump  Psychiatric: He has a normal mood and affect. Hisbehavior is normal.Thought contentnormal.   Assessment/Plan: 1. Functional deficits secondary to right BKA which require 3+ hours per day of interdisciplinary therapy in a comprehensive inpatient rehab setting.  Physiatrist is providing close team supervision and 24 hour management of active medical problems listed below.  Physiatrist and rehab team continue to assess barriers to discharge/monitor  patient progress toward functional and medical goals  Care Tool:  Bathing    Body parts bathed by patient: Face, Left upper leg, Right upper leg, Buttocks, Front perineal area, Abdomen, Chest, Left arm, Right arm   Body parts bathed by helper: Left lower leg Body parts n/a: Right lower leg   Bathing assist Assist Level: Minimal Assistance - Patient > 75%     Upper Body Dressing/Undressing Upper body dressing   What is the patient wearing?: Pull over shirt    Upper body assist Assist Level: Supervision/Verbal cueing    Lower Body Dressing/Undressing Lower body dressing      What is the patient wearing?: Pants     Lower body assist Assist for lower body dressing: Maximal Assistance - Patient 25 - 49%     Toileting Toileting    Toileting assist Assist for toileting: Maximal Assistance - Patient 25 - 49%     Transfers Chair/bed transfer  Transfers assist     Chair/bed transfer assist level: Minimal Assistance - Patient > 75%     Locomotion Ambulation   Ambulation assist   Ambulation activity did not occur: Safety/medical concerns(unable secondary to weakness and R BKA)          Walk 10 feet activity   Assist  Walk 10 feet activity did not occur: Safety/medical concerns        Walk 50 feet activity   Assist Walk 50 feet with 2 turns activity did not occur: Safety/medical concerns         Walk  150 feet activity   Assist Walk 150 feet activity did not occur: Safety/medical concerns         Walk 10 feet on uneven surface  activity   Assist Walk 10 feet on uneven surfaces activity did not occur: Safety/medical concerns         Wheelchair     Assist Will patient use wheelchair at discharge?: Yes Type of Wheelchair: Manual    Wheelchair assist level: Supervision/Verbal cueing Max wheelchair distance: 150 ft    Wheelchair 50 feet with 2 turns activity    Assist        Assist Level: Supervision/Verbal cueing    Wheelchair 150 feet activity     Assist     Assist Level: Supervision/Verbal cueing      Medical Problem List and Plan: 1.Decreased functional mobilitysecondary to 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 Antithrombotics: -DVT/anticoagulation:SCDs left lower extremity -antiplatelet therapy: Await plan to begin aspirin 3. Pain Management:Neurontin 300 mg 3 times daily, Robaxin and oxycodone as needed 4. Mood:Provide emotional support -antipsychotic agents: N/A 5. Neuropsych: This patientiscapable of making decisions on hisown behalf. 6. Skin/Wound Care:  [facility] 7. Fluids/Electrolytes/Nutrition:Routine in and out 8.Acute blood loss anemia.   Patient refused any GI work-up on acute floor.  Hemoglobin 7.9 on 6/24, continues to trend down, labs ordered for tomorrow  Continue to monitor 9. Acute onset atrial fibrillation. Amiodarone 200 mg twice daily. Cardiology follow-up 10. Chronic diastolic congestive heart failure. Lasix 40 mg daily Filed Weights   11/20/18 1734 11/21/18 0330 11/22/18 0606  Weight: 93.3 kg 93.6 kg 91.4 kg   Stable on 6/25 11. Diabetes mellitus with peripheral neuropathy. Hemoglobin A1c 5.7. Glucotrol 5 mg daily,NovoLog 3 units 3 times daily, Levemir 8 units nightly. Check blood sugars before meals and at bedtime  Relatively controlled on 6/25  Monitor with increased mobility 12. CKD stage II. Follow-up chemistries on admit  Creatinine 1.22 on 6/24  Continue to monitor 13. Constipation. Laxative assistance 14.  Hypoalbuminemia  Supplement initiated on 6/24 15.  Leukocytosis  WBC 13.2 on 6/24, labs ordered for tomorrow  Afebrile  Continue to monitor  LOS: 2 days A FACE TO FACE EVALUATION WAS PERFORMED  Ankit Karis JubaAnil Patel 11/22/2018, 8:45 AM

## 2018-11-22 NOTE — Progress Notes (Signed)
Physical Therapy Session Note  Patient Details  Name: Joseph [name] MRN: [id number] Date of Birth: [birth year]  Today's Date: 11/22/2018 PT Individual Time: 1000-1055 PT Individual Time Calculation (min): 55 min   Short Term Goals: Week 1:  PT Short Term Goal 1 (Week 1): Pt will perform squat pivot transfer with CGA from bed<>w/c PT Short Term Goal 2 (Week 1): Pt will perform sit<>stands with mod assist from various surface heights PT Short Term Goal 3 (Week 1): Pt will initiate gait training  Skilled Therapeutic Interventions/Progress Updates:   Pt in w/c and agreeable to therapy, denies pain. Pt self-propelled w/c to/from therapy gym w/ supervision using BUEs. Squat pivot to edge of mat w/ min assist. Worked on sit<>stands to RW from elevated mat surface. Min-mod assist overall to stand and CGA-min assist to maintain static stance. Verbal and tactile cues for upright posture and to reach full L knee extension. Added in push-up bars to act as armrests w/ increased independence. Problem solved technique, which UE to push up w/ and which UE to lead on RW. Performed 4-5 reps and worked on hopping in place w/ LLE. Able to take controlled hop in place w/ min assist for balance, x5 reps at a time in stance. Ambulated 7' w/ RW, min assist and w/c follow for safety. Returned to room and ended session in w/c, all needs in reach. Sit<>stand from w/c w/ mod assist to show RN skin irritation spot on buttocks, pt noticed it when sitting on firm mat surface.   Therapy Documentation Precautions:  Precautions Precautions: Fall Precaution Comments: wear R leg brace at all times Required Braces or Orthoses: Other Brace Other Brace: residual limb splint Restrictions Weight Bearing Restrictions: Yes RLE Weight Bearing: Non weight bearing Pain: Pain Assessment Pain Scale: 0-10 Pain Score: 8  Pain Type: Acute pain;Surgical pain Pain Location: Leg Pain Orientation: Right Pain Radiating Towards: stump  site Pain Descriptors / Indicators: Burning Pain Frequency: Constant Pain Onset: On-going Patients Stated Pain Goal: 5 Pain Intervention(s): Medication (See eMAR)(oxycodone given) Multiple Pain Sites: No  Therapy/Group: Individual Therapy  [name] 11/22/2018, 11:10 AM

## 2018-11-23 ENCOUNTER — Inpatient Hospital Stay (HOSPITAL_COMMUNITY): Payer: Medicare Other | Admitting: Physical Therapy

## 2018-11-23 ENCOUNTER — Inpatient Hospital Stay (HOSPITAL_COMMUNITY): Payer: Medicare Other | Admitting: Occupational Therapy

## 2018-11-23 LAB — CBC WITH DIFFERENTIAL/PLATELET
Abs Immature Granulocytes: 0.05 10*3/uL (ref 0.00–0.07)
Basophils Absolute: 0 10*3/uL (ref 0.0–0.1)
Basophils Relative: 0 %
Eosinophils Absolute: 0.1 10*3/uL (ref 0.0–0.5)
Eosinophils Relative: 1 %
HCT: 24.3 % — ABNORMAL LOW (ref 39.0–52.0)
Hemoglobin: 7.7 g/dL — ABNORMAL LOW (ref 13.0–17.0)
Immature Granulocytes: 0 %
Lymphocytes Relative: 36 %
Lymphs Abs: 4.4 10*3/uL — ABNORMAL HIGH (ref 0.7–4.0)
MCH: 29.4 pg (ref 26.0–34.0)
MCHC: 31.7 g/dL (ref 30.0–36.0)
MCV: 92.7 fL (ref 80.0–100.0)
Monocytes Absolute: 0.7 10*3/uL (ref 0.1–1.0)
Monocytes Relative: 6 %
Neutro Abs: 6.9 10*3/uL (ref 1.7–7.7)
Neutrophils Relative %: 57 %
Platelets: 406 10*3/uL — ABNORMAL HIGH (ref 150–400)
RBC: 2.62 MIL/uL — ABNORMAL LOW (ref 4.22–5.81)
RDW: 13.4 % (ref 11.5–15.5)
WBC: 12.3 10*3/uL — ABNORMAL HIGH (ref 4.0–10.5)
nRBC: 0 % (ref 0.0–0.2)

## 2018-11-23 LAB — GLUCOSE, CAPILLARY
Glucose-Capillary: 110 mg/dL — ABNORMAL HIGH (ref 70–99)
Glucose-Capillary: 216 mg/dL — ABNORMAL HIGH (ref 70–99)

## 2018-11-23 NOTE — Progress Notes (Signed)
Joseph [name] PHYSICAL MEDICINE & REHABILITATION PROGRESS NOTE  Subjective/Complaints: Patient seen sitting up in bed this morning.  He states he slept well overnight.  He denies complaints.  He notes improvement in strength.  ROS: Denies CP, shortness of breath, nausea, vomiting, diarrhea.  Objective: Vital Signs: Blood pressure 116/66, pulse 68, temperature 99.1 F (37.3 C), temperature source Oral, resp. rate 18, height 6\' 4"  (1.93 m), weight 96 kg, SpO2 97 %. No results found. Recent Labs    11/21/18 0622 11/23/18 0557  WBC 13.2* 12.3*  HGB 7.9* 7.7*  HCT 24.9* 24.3*  PLT 412* 406*   Recent Labs    11/20/18 1014 11/21/18 0622  NA 135 135  K 4.6 4.3  CL 101 101  CO2 25 27  GLUCOSE 168* 111*  BUN 21 20  CREATININE 1.40* 1.22  CALCIUM 8.8* 8.7*    Physical Exam: BP 116/66 (BP Location: Right Arm)   Pulse 68   Temp 99.1 F (37.3 C) (Oral)   Resp 18   Ht 6\' 4"  (1.93 m)   Wt 96 kg   SpO2 97%   BMI 25.76 kg/m  Constitutional: No distress . Vital signs reviewed. HENT: Normocephalic.  Atraumatic. Eyes: EOMI.  No discharge. Cardiovascular: No JVD. Respiratory: Normal effort. GI: Non-distended. Musc: Right stump with tenderness, unchanged Neurological: Alert and oriented Motor: Bilateral upper extremities: 5/5 proximal distal Left lower extremity: 3/5 proximal to distal, stable. Right lower extremity: Hip flexion 4-/5 hip flexion, stable Sensation diminished to light touch left foot Skin: + VAC right stump, suctioning Psychiatric: He has a normal mood and affect. Hisbehavior is normal.Thought contentnormal.   Assessment/Plan: 1. Functional deficits secondary to right BKA which require 3+ hours per day of interdisciplinary therapy in a comprehensive inpatient rehab setting.  Physiatrist is providing close team supervision and 24 hour management of active medical problems listed below.  Physiatrist and rehab team continue to assess barriers to  discharge/monitor patient progress toward functional and medical goals  Care Tool:  Bathing    Body parts bathed by patient: Face, Left upper leg, Right upper leg, Buttocks, Front perineal area, Abdomen, Chest, Left arm, Right arm   Body parts bathed by helper: Left lower leg Body parts n/a: Right lower leg   Bathing assist Assist Level: Minimal Assistance - Patient > 75%     Upper Body Dressing/Undressing Upper body dressing   What is the patient wearing?: Pull over shirt    Upper body assist Assist Level: Supervision/Verbal cueing    Lower Body Dressing/Undressing Lower body dressing      What is the patient wearing?: Pants     Lower body assist Assist for lower body dressing: Maximal Assistance - Patient 25 - 49%     Toileting Toileting    Toileting assist Assist for toileting: Maximal Assistance - Patient 25 - 49%     Transfers Chair/bed transfer  Transfers assist     Chair/bed transfer assist level: Minimal Assistance - Patient > 75%     Locomotion Ambulation   Ambulation assist   Ambulation activity did not occur: Safety/medical concerns(unable secondary to weakness and R BKA)  Assist level: Minimal Assistance - Patient > 75% Assistive device: Walker-rolling Max distance: 7'   Walk 10 feet activity   Assist  Walk 10 feet activity did not occur: Safety/medical concerns        Walk 50 feet activity   Assist Walk 50 feet with 2 turns activity did not occur: Safety/medical concerns  Walk 150 feet activity   Assist Walk 150 feet activity did not occur: Safety/medical concerns         Walk 10 feet on uneven surface  activity   Assist Walk 10 feet on uneven surfaces activity did not occur: Safety/medical concerns         Wheelchair     Assist Will patient use wheelchair at discharge?: Yes Type of Wheelchair: Manual    Wheelchair assist level: Supervision/Verbal cueing Max wheelchair distance: 150'     Wheelchair 50 feet with 2 turns activity    Assist        Assist Level: Supervision/Verbal cueing   Wheelchair 150 feet activity     Assist     Assist Level: Supervision/Verbal cueing      Medical Problem List and Plan: 1.Decreased functional mobilitysecondary to 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 Antithrombotics: -DVT/anticoagulation:SCDs left lower extremity -antiplatelet therapy: Await plan to begin aspirin 3. Pain Management:Neurontin 300 mg 3 times daily, Robaxin and oxycodone as needed 4. Mood:Provide emotional support -antipsychotic agents: N/A 5. Neuropsych: This patientiscapable of making decisions on hisown behalf. 6. Skin/Wound Care:  DC VAC 7. Fluids/Electrolytes/Nutrition:Routine in and out 8.Acute blood loss anemia.   Patient refused any GI work-up on acute floor.  Hemoglobin 7.7 on 6/26, continues to trend down, labs ordered for Monday.  We will discussed with PA  Continue to monitor 9. Acute onset atrial fibrillation. Amiodarone 200 mg twice daily. Cardiology follow-up 10. Chronic diastolic congestive heart failure. Lasix 40 mg daily Filed Weights   11/21/18 0330 11/22/18 0606 11/23/18 0619  Weight: 93.6 kg 91.4 kg 96 kg   ?  Reliability on 6/26 11. Diabetes mellitus with peripheral neuropathy. Hemoglobin A1c 5.7. Glucotrol 5 mg daily,NovoLog 3 units 3 times daily, Levemir 8 units nightly. Check blood sugars before meals and at bedtime  Labile on 6/26  Monitor with increased mobility 12. CKD stage II.   Creatinine 1.22 on 6/24, labs ordered for Monday  Continue to monitor 13. Constipation. Laxative assistance 14.  Hypoalbuminemia  Supplement initiated on 6/24 15.  Leukocytosis  WBC 12.3 on 6/26, labs ordered for Monday  Afebrile  Continue to monitor  LOS: 3 days A FACE TO FACE EVALUATION WAS PERFORMED  Ankit [name] 11/23/2018, 9:36 AM

## 2018-11-23 NOTE — Progress Notes (Signed)
Social Work Patient ID: Joseph [name], male   DOB: [birth year], 74 y.o.   MRN: [id number]   CSW met with pt and later called his ex-wife/friend to update them on team conference discussion and targeted d/c date of 12-04-18.  They were pleased.  CSW will continue to follow and assist as needed.

## 2018-11-23 NOTE — Progress Notes (Signed)
Occupational Therapy Session Note  Patient Details  Name: Joseph [name] MRN: [id number] Date of Birth: 07-05-1945  Today's Date: 11/23/2018  Session 1 OT Individual Time: 5400-8676 OT Individual Time Calculation (min): 39 min   Session 2 OT Individual Time: 1950-9326 OT Individual Time Calculation (min): 70 min    Short Term Goals: Week 1:  OT Short Term Goal 1 (Week 1): Pt will sit to stand wit MOD A in prep for cloting management OT Short Term Goal 2 (Week 1): Pt will transfer wiht S to toilet wiht LRAD OT Short Term Goal 3 (Week 1): Pt will manage pants past hips prior to toileting wiht CGA OT Short Term Goal 4 (Week 1): Pt will thread BLE into pants wiht AE PRn  Skilled Therapeutic Interventions/Progress Updates:    Session 1 Pt greeted semi-reclined in bed finishing breakfast and agreeable to OT Treatment session focused on self-care retraining. Pt came to sitting EOB with supervision. He completed lateral transfer bed>drop arm wc w/ CGA. Pt propelled wc to the sink for bathing/dressing tasks. Pt completed UB bathing and grooming tasks with setup A. Worked on sit<>stand with mod A from lower wc at the sink, then OT assisted with washing buttocks. Pt needed assistance to thread R residual through pant leg 2/2 wound vac. Pt left seated in wc at end of session with alarm belt on and needs met. Nursing notified of request for pain medication  Session 2 Pt greeted seated in wc and agreeable to OT treatment session. Pt needed max A to don L TED hose and L shoe 2/2 painful L knee flexion. Pt has bruise on L knee and states it is painful to the touch. Worked on wc propulsion to therapy gym with increased time and verbal cues for technique. Had pt practice setting up wc at edge of therapy mat and removing leg rests. Pt completed lateral transfer with min A. Worked on sit<>stands from raised therapy mat. Pt needed mod A to stand the first time with verbal cues for hand placement. Pr reported  soreness in bottom where OT assists pulling from pants, so gait belt used to provide assistance with stand. Pt completed 6 sit<>stands with min/mod A with rest breaks in between. UB there-ex using level 3 green theraband. Seated row, bicep curls, and triceps press 3 sets of 10. Pt propelled wc back to room with supervision.   Therapy Documentation Precautions:  Precautions Precautions: Fall Precaution Comments: wear R leg brace at all times Required Braces or Orthoses: Other Brace Other Brace: residual limb splint Restrictions Weight Bearing Restrictions: Yes RLE Weight Bearing: Non weight bearing Pain: Pain Assessment Pain Scale: 0-10 Pain Score: 7  Pain Type: Acute pain Pain Location: Leg Pain Orientation: Right Pain Descriptors / Indicators: Aching Pain Onset: On-going Pain Intervention(s): Repositioned  Therapy/Group: Individual Therapy  Valma Cava 11/23/2018, 12:18 PM

## 2018-11-23 NOTE — Progress Notes (Signed)
Physical Therapy Session Note  Patient Details  Name: Joseph [name] MRN: [id number] Date of Birth: March 06, 1946  Today's Date: 11/23/2018 PT Individual Time: 1300-1400 PT Individual Time Calculation (min): 60 min   Short Term Goals: Week 1:  PT Short Term Goal 1 (Week 1): Pt will perform squat pivot transfer with CGA from bed<>w/c PT Short Term Goal 2 (Week 1): Pt will perform sit<>stands with mod assist from various surface heights PT Short Term Goal 3 (Week 1): Pt will initiate gait training  Skilled Therapeutic Interventions/Progress Updates:   Pt received sitting in WC and agreeable to PT. WC mobility to rehab gym with supervision assist 2x 156f. Min cues for safety and turning technique  Through door to gym. [facility]. Pt reports severe pain in the lateral L knee with transfers. Upon inspection, PT notes mild swelling and erythremia lateral/superior to patella. RN made aware. Squat pivot/lateral scoot transfer to mat table with min assist moderate cues for set up/safety. Sit<>supine with supervision assist in increased time. Supine therex. SAQ x 15, bridge through thighs on bolster x 10 , hip abduction x 12. Ankle PF/DFx 25 LLE. Patient returned to [facility] and [facility] and all needs met.        Therapy Documentation Precautions:  Precautions Precautions: Fall Precaution Comments: wear R leg brace at all times Required Braces or Orthoses: Other Brace Other Brace: residual limb splint Restrictions Weight Bearing Restrictions: Yes RLE Weight Bearing: Non weight bearing Pain: denies at rest.   Therapy/Group: Individual Therapy  ALorie Phenix6/26/2020, 2:16 PM

## 2018-11-23 NOTE — Progress Notes (Signed)
Social Work Assessment and Plan   Patient Details  Name: Joseph [name] MRN: [id number] Date of Birth: 06/04/1945  Today's Date: 11/22/2018  Problem List:  Patient Active Problem List   Diagnosis Date Noted  . Acute blood loss anemia   . Chronic diastolic congestive heart failure ([place])   . Diabetic peripheral neuropathy ([place])   . CKD (chronic kidney disease), stage II   . Hypoalbuminemia due to protein-calorie malnutrition ([place])   . Leukocytosis   . 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[place]) 11/20/2018  . Cellulitis of right lower extremity   . Atrial fibrillation ([place])   . Gangrene of right foot ([place])   . Diabetic polyneuropathy associated with type 2 diabetes mellitus ([place])   . PVD (peripheral vascular disease) ([place])   . Critical lower limb ischemia   . Sepsis with acute renal failure ([place]) 11/13/2018  . Acute renal failure superimposed on stage 2 chronic kidney disease ([place])   . Open wound of right foot   . 'light-for-dates' infant with signs of fetal malnutrition 10/17/2018  . DM (diabetes mellitus) ([place]) 07/02/2012  . Hypertension associated with diabetes ([place]) 07/02/2012   Past Medical History:  Past Medical History:  Diagnosis Date  . Diabetes mellitus type 2 in nonobese (HCC)   . Hypertension    Past Surgical History:  Past Surgical History:  Procedure Laterality Date  . AMPUTATION Right 11/16/2018   Procedure: RIGHT BELOW KNEE AMPUTATION;  Surgeon: Newt Minion, MD;  Location: [facility];  Service: Orthopedics;  Laterality: Right;  . HAND SURGERY     Social History:  reports that he has never smoked. He has never used smokeless tobacco. He reports current alcohol use. He reports that he does not use drugs.  Family / Support Systems Marital Status: Divorced Patient Roles: Parent Children: Joseph [name] - son - [phone] Other Supports: Joseph [name] - ex-wife/friend - [phone] Anticipated Caregiver: Ex-wife Joseph [name] and adult children Ability/Limitations of Caregiver:  plan to come to [place] with her children to care for pt Caregiver Availability: 24/7 Family Dynamics: Pt describes Joseph [name] as "the glue" that keeps the family together even though they are divorced and Joseph [name] is remarried.  Social History Preferred language: English Religion: Holiness Read: Yes Write: Yes Employment Status: Retired Date Retired/Disabled/Unemployed: 2019 Age Retired: 36 Public relations account executive Issues: none reported Guardian/Conservator: N/A - MD has determined that pt is capable of making his own decisions.   Abuse/Neglect Abuse/Neglect Assessment Can Be Completed: Yes Physical Abuse: Denies Verbal Abuse: Denies Sexual Abuse: Denies Exploitation of patient/patient's resources: Denies Self-Neglect: Denies  Emotional Status Pt's affect, behavior and adjustment status: Pt was in good spirits and ready to go through rehab and get his prosthesis. Recent Psychosocial Issues: none reported Psychiatric History: none reported Substance Abuse History: none reported  Patient / Family Perceptions, Expectations & Goals Pt/Family understanding of illness & functional limitations: Pt/Joseph [name] have a good understanding of pt's condition and limitations. Premorbid pt/family roles/activities: Pt liked to ride his motorcycle, be in the yard, and spend time with his family.  While trying to heal/save his leg, pt did not so much. Anticipated changes in roles/activities/participation: Pt would like to be more active than he was PTA.  He is interested in a SCAT application so he can get out and about until he is cleared to drive. Pt/family expectations/goals: As above.  He wants to return to his home with some kind of version of independence.  Community Resources Express Scripts: None Premorbid Home Care/DME Agencies: None Transportation  available at discharge: family Resource referrals recommended: Neuropsychology, Support group (specify)(amputee support group)  Discharge  Planning Living Arrangements: Alone Support Systems: Children, Other relatives, Friends/neighbors Type of Residence: Private residence Insurance Resources: [employer] Financial Resources: Radio broadcast assistant Screen Referred: No Living Expenses: Mortgage Money Management: Patient Does the patient have any problems obtaining your medications?: No Home Management: Pt was doing all of this. Patient/Family Preliminary Plans: Pt's ex-wife will coordinate help for pt.  He also has good help from his neighbors, they are mowing his grass, can run errands, etc. Social Work Anticipated Follow Up Needs: HH/OP, Support Group Expected length of stay: 2 weeks  Clinical Impression CSW met with pt yesterday and spoke with his ex-wife/friend today via telephone to introduce self and role of CSW, as well as to complete assessment.  Pt was very open and talkative with CSW about his life, his amputation, and his dreams/goals for his future.  He has good family/friend/neighbor support and feels hopeful about his new independence and yet, is unsure what that will look like.  He is open to community resources and new supports to aid in his goal for independence.  Pt is hopeful and motivated to work and his ex-wife/friend, Joseph [name] and family, are supportive of him.  Pt wishes to complete SCAT application while on CIR and CSW will assist.  He also is interested in [place] and CSW will share this info with him.  CSW will continue to follow and assist as needed.  Geneva Pallas, Silvestre Mesi 11/23/2018, 10:25 PM

## 2018-11-23 NOTE — Progress Notes (Signed)
Wound vac discontinued according to MD order. Patient tolerated well. Incision attached by staples, intact, scattered areas of dried sanguinous drainage noted but no fresh drainage. Dressing applied.

## 2018-11-23 NOTE — Progress Notes (Signed)
Inpatient Rehabilitation Center Individual Statement of Services  Patient Name:  Joseph [name]  Date:  11/23/2018  Welcome to the [facility].  Our goal is to provide you with an individualized program based on your diagnosis and situation, designed to meet your specific needs.  With this comprehensive rehabilitation program, you will be expected to participate in at least 3 hours of rehabilitation therapies Monday-Friday, with modified therapy programming on the weekends.  Your rehabilitation program will include the following services:  Physical Therapy (PT), Occupational Therapy (OT), 24 hour per day rehabilitation nursing, Neuropsychology, Case Management (Social Worker), Rehabilitation Medicine, Nutrition Services and Pharmacy Services  Weekly team conferences will be held on Wednesdays to discuss your progress.  Your Social Worker will talk with you frequently to get your input and to update you on team discussions.  Team conferences with you and your family in attendance may also be held.  Expected length of stay: 2 weeks  Overall anticipated outcome: Modified independent with some supervision  Depending on your progress and recovery, your program may change. Your Social Worker will coordinate services and will keep you informed of any changes. Your Social Worker's name and contact numbers are listed  below.  The following services may also be recommended but are not provided by the [facility] will be made to provide these services after discharge if needed.  Arrangements include referral to agencies that provide these services.  Your insurance has been verified to be:  Medicare Your primary doctor is:  [facility]  Pertinent information will be shared with your doctor and your insurance company.  Social  Worker:  Alfonse Alpers, LCSW  [phone] or ([phone]  Information discussed with and copy given to patient by: Trey Sailors, 11/23/2018, 9:29 PM

## 2018-11-24 ENCOUNTER — Inpatient Hospital Stay (HOSPITAL_COMMUNITY): Payer: Medicare Other | Admitting: Physical Therapy

## 2018-11-24 ENCOUNTER — Inpatient Hospital Stay (HOSPITAL_COMMUNITY): Payer: Medicare Other | Admitting: Occupational Therapy

## 2018-11-24 LAB — GLUCOSE, CAPILLARY
Glucose-Capillary: 103 mg/dL — ABNORMAL HIGH (ref 70–99)
Glucose-Capillary: 110 mg/dL — ABNORMAL HIGH (ref 70–99)
Glucose-Capillary: 129 mg/dL — ABNORMAL HIGH (ref 70–99)
Glucose-Capillary: 95 mg/dL (ref 70–99)

## 2018-11-24 MED ORDER — DICLOFENAC SODIUM 1 % TD GEL
2.0000 g | Freq: Three times a day (TID) | TRANSDERMAL | Status: DC
  Administered 2018-11-24 – 2018-12-04 (×30): 2 g via TOPICAL
  Filled 2018-11-24: qty 100

## 2018-11-24 NOTE — Progress Notes (Addendum)
[facility] [place] [facility] PHYSICAL MEDICINE & REHABILITATION PROGRESS NOTE  Subjective/Complaints: Overall happy with progress. Having more left knee pain, sometimes swelling there too  ROS: Patient denies fever, rash, sore throat, blurred vision, nausea, vomiting, diarrhea, cough, shortness of breath or chest pain,   headache, or mood change.   Objective: Vital Signs: Blood pressure 102/62, pulse 65, temperature 98.9 F (37.2 C), temperature source Oral, resp. rate 17, height 6\' 4"  (1.93 m), weight 90 kg, SpO2 94 %. No results found. Recent Labs    11/23/18 0557  WBC 12.3*  HGB 7.7*  HCT 24.3*  PLT 406*   No results for input(s): NA, K, CL, CO2, GLUCOSE, BUN, CREATININE, CALCIUM in the last 72 hours.  Physical Exam: BP 102/62 (BP Location: Left Arm)   Pulse 65   Temp 98.9 F (37.2 C) (Oral)   Resp 17   Ht 6\' 4"  (1.93 m)   Wt 90 kg   SpO2 94%   BMI 24.15 kg/m  Constitutional: No distress . Vital signs reviewed. HEENT: EOMI, oral membranes moist Neck: supple Cardiovascular: RRR without murmur. No JVD    Respiratory: CTA Bilaterally without wheezes or rales. Normal effort    GI: BS +, non-tender, non-distended  Musc: Right stump with tenderness, unchanged, left knee with mild effusion, some tenderness with flex/ext Neurological: Alert and oriented Motor: Bilateral upper extremities: 5/5 proximal distal Left lower extremity: 3/5 proximal to distal, stable. Right lower extremity: Hip flexion 4-/5 hip flexion, stable Sensation diminished to light touch left foot Skin: right stump dressed, limb guard Psychiatric: He has a normal mood and affect. Hisbehavior is normal.Thought contentnormal.   Assessment/Plan: 1. Functional deficits secondary to right BKA which require 3+ hours per day of interdisciplinary therapy in a comprehensive inpatient rehab setting.  Physiatrist is providing close team supervision and 24 hour management of active medical problems listed below.  Physiatrist  and rehab team continue to assess barriers to discharge/monitor patient progress toward functional and medical goals  Care Tool:  Bathing    Body parts bathed by patient: Face, Left upper leg, Right upper leg, Buttocks, Front perineal area, Abdomen, Chest, Left arm, Right arm   Body parts bathed by helper: Left lower leg Body parts n/a: Right lower leg   Bathing assist Assist Level: Minimal Assistance - Patient > 75%     Upper Body Dressing/Undressing Upper body dressing   What is the patient wearing?: Pull over shirt    Upper body assist Assist Level: Supervision/Verbal cueing    Lower Body Dressing/Undressing Lower body dressing      What is the patient wearing?: Pants     Lower body assist Assist for lower body dressing: Maximal Assistance - Patient 25 - 49%     Toileting Toileting    Toileting assist Assist for toileting: Maximal Assistance - Patient 25 - 49%     Transfers Chair/bed transfer  Transfers assist     Chair/bed transfer assist level: Minimal Assistance - Patient > 75%     Locomotion Ambulation   Ambulation assist   Ambulation activity did not occur: Safety/medical concerns(unable secondary to weakness and R BKA)  Assist level: Minimal Assistance - Patient > 75% Assistive device: Walker-rolling Max distance: 7'   Walk 10 feet activity   Assist  Walk 10 feet activity did not occur: Safety/medical concerns        Walk 50 feet activity   Assist Walk 50 feet with 2 turns activity did not occur: Safety/medical concerns  Walk 150 feet activity   Assist Walk 150 feet activity did not occur: Safety/medical concerns         Walk 10 feet on uneven surface  activity   Assist Walk 10 feet on uneven surfaces activity did not occur: Safety/medical concerns         Wheelchair     Assist Will patient use wheelchair at discharge?: Yes Type of Wheelchair: Manual    Wheelchair assist level: Supervision/Verbal  cueing Max wheelchair distance: 150'    Wheelchair 50 feet with 2 turns activity    Assist        Assist Level: Supervision/Verbal cueing   Wheelchair 150 feet activity     Assist     Assist Level: Supervision/Verbal cueing      Medical Problem List and Plan: 1.Decreased functional mobilitysecondary to 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 Antithrombotics: -DVT/anticoagulation:SCDs left lower extremity -antiplatelet therapy: Await plan to begin aspirin 3. Pain Management:Neurontin 300 mg 3 times daily, Robaxin and oxycodone as needed  -add voltaren gel for left knee pain 4. Mood:Provide emotional support -antipsychotic agents: N/A 5. Neuropsych: This patientiscapable of making decisions on hisown behalf. 6. Skin/Wound Care:  Dressing, limb guard, compression to stump 7. Fluids/Electrolytes/Nutrition:Routine in and out 8.Acute blood loss anemia.   Patient refused any GI work-up on acute floor.  Hemoglobin 7.7 on 6/26, continues to trend down, labs ordered for Monday.     Asymptomatic at present 9. Acute onset atrial fibrillation. Amiodarone 200 mg twice daily. Cardiology follow-up 10. Chronic diastolic congestive heart failure. Lasix 40 mg daily Filed Weights   11/22/18 0606 11/23/18 0619 11/24/18 0350  Weight: 91.4 kg 96 kg 90 kg   Stable 6/27 11. Diabetes mellitus with peripheral neuropathy. Hemoglobin A1c 5.7. Glucotrol 5 mg daily,NovoLog 3 units 3 times daily, Levemir 8 units nightly. Check blood sugars before meals and at bedtime  High number in PM yesterday---observe for pattern  Cover with SSI for now before changing 12. CKD stage II.   Creatinine 1.22 on 6/24, labs ordered for Monday  Continue to monitor 13. Constipation. Laxative assistance 14.  Hypoalbuminemia  Supplement initiated on 6/24 15.  Leukocytosis  WBC 12.3 on 6/26, labs ordered  for Monday  Afebrile  Continue to monitor  LOS: 4 days A FACE TO FACE EVALUATION WAS PERFORMED  Meredith Staggers 11/24/2018, 9:01 AM

## 2018-11-24 NOTE — Progress Notes (Signed)
Occupational Therapy Session Note  Patient Details  Name: Joseph [name] MRN: [id number] Date of Birth: 1946-03-03  Today's Date: 11/24/2018 OT Individual Time: 0800-0900 OT Individual Time Calculation (min): 60 min    Short Term Goals: Week 1:  OT Short Term Goal 1 (Week 1): Pt will sit to stand wit MOD A in prep for cloting management OT Short Term Goal 2 (Week 1): Pt will transfer wiht S to toilet wiht LRAD OT Short Term Goal 3 (Week 1): Pt will manage pants past hips prior to toileting wiht CGA OT Short Term Goal 4 (Week 1): Pt will thread BLE into pants wiht AE PRn  Skilled Therapeutic Interventions/Progress Updates:    1:! Pt has wound vac d/c yesterday. Engaged in self care retraining at shower level with residual limb covered. Pt performed supervision to come to EOB. Lateral leans at EOB to doff pants with extra time.  Min A transfer scoot pivot (with little bottom clearance) to the w/c. Pt with difficulty maintaining a forward weight shift with the transfer and due to pain not able to fully push through left LE. Transfer into shower to shower chair with min A again with use of grab bar and A for w/c setup. Cleansed periarea with lateral leans; required A for washing left foot. Perform UB dressing with setup . Transferred shower chair<w/c<>bed all with min A with mod cues for w/c setup and mechanics for optimal transfer. Lateral leans on EOB to pull up clothing. Pt left resting in prep for next session.,   Therapy Documentation Precautions:  Precautions Precautions: Fall Precaution Comments: wear R leg brace at all times Required Braces or Orthoses: Other Brace Other Brace: residual limb splint Restrictions Weight Bearing Restrictions: (P) Yes RLE Weight Bearing: (P) Non weight bearing Pain:  c/o pain with flexion in left knee- allowed for rest and deferred standing in this session. ADL: ADL Eating: Independent Grooming: Setup Where Assessed-Grooming: Edge of bed Upper Body  Bathing: Supervision/safety Where Assessed-Upper Body Bathing: Edge of bed Lower Body Bathing: Supervision/safety Upper Body Dressing: Supervision/safety Where Assessed-Upper Body Dressing: Edge of bed Where Assessed-Lower Body Dressing: Edge of bed Toileting: Maximal cueing Where Assessed-Toileting: Bedside Commode Toilet Transfer Method: Ambulating   Therapy/Group: Individual Therapy  Willeen Cass [facility] 11/24/2018, 8:29 AM

## 2018-11-24 NOTE — Progress Notes (Signed)
Physical Therapy Session Note  Patient Details  Name: Joseph [name] MRN: [id number] Date of Birth: Jul 06, 1945  Today's Date: 11/24/2018 PT Individual Time: 8786-7672 PT Individual Time Calculation (min): 40 min   Short Term Goals: Week 1:  PT Short Term Goal 1 (Week 1): Pt will perform squat pivot transfer with CGA from bed<>w/c PT Short Term Goal 2 (Week 1): Pt will perform sit<>stands with mod assist from various surface heights PT Short Term Goal 3 (Week 1): Pt will initiate gait training  Skilled Therapeutic Interventions/Progress Updates:   Pt received in w/c.  Pt continues to report ongoing pain in L knee but improved since applying gel and ice.  Pt requested to work on UE strength.  Performed w/c propulsion x 150' to gym with supervision and verbal cues for more efficient UE propulsion for energy conservation and joint conservation.  Pt performed transfers w/c <> mat squat pivot with supervision-min A to support R limb.  On mat pt performed the following UE strengthening exercises:  -Tricep push ups with push up blocks - 2 sets x 10 reps -Blue theraband resisted bilat UE rows - 2 sets x 10 reps seated without back support -Blue theraband resisted bilat UE horizontal ABD (chest flies) - 1 set x 10 reps seated without back support -Blue theraband resisted bilat UE diagonal flies (shape of X) - 1 set each side x 10 reps seated without back support -Blue theraband resisted bilat UE bicep curls - 2 sets x 10 reps seated without back support  Blue theraband provided to pt for him to perform exercises in his room.  Pt returned to room with therapist assistance due to UE fatigue.  Ice pack placed on L knee for edema management.  Left with all items within reach.   Therapy Documentation Precautions:  Precautions Precautions: Fall Precaution Comments: wear R leg brace at all times Required Braces or Orthoses: Other Brace Other Brace: residual limb splint Restrictions Weight Bearing  Restrictions: (P) Yes RLE Weight Bearing: (P) Non weight bearing Vital Signs: Therapy Vitals Temp: 98.4 F (36.9 C) Temp Source: Oral Pulse Rate: 65 Resp: 19 BP: 103/62 Patient Position (if appropriate): Sitting Oxygen Therapy SpO2: 100 % O2 Device: Room Air Pain: Pain Assessment Pain Scale: 0-10 Pain Score: 5  Pain Location: Knee Pain Orientation: Left Pain Descriptors / Indicators: Sore Pain Onset: On-going Pain Intervention(s): Cold applied Other Treatments: Treatments Therapeutic Activity: Discussed and educated pt on timeline to receiving prosthesis and initiating training with prosthesis at outpatient. Modalities Modalities: Cryotherapy Cryotherapy Cryotherapy Location: Knee Type of Cryotherapy: Ice pack    Therapy/Group: Individual Therapy  Rico Junker, PT, DPT 11/24/18    3:49 PM    11/24/2018, 3:48 PM

## 2018-11-24 NOTE — Progress Notes (Signed)
Physical Therapy Session Note  Patient Details  Name: Joseph [name] MRN: [id number] Date of Birth: 21-Dec-1945  Today's Date: 11/24/2018 PT Individual Time: 0900-0959 AND 1500-1525 PT Individual Time Calculation (min): 59 min AND 25 min  Short Term Goals: Week 1:  PT Short Term Goal 1 (Week 1): Pt will perform squat pivot transfer with CGA from bed<>w/c PT Short Term Goal 2 (Week 1): Pt will perform sit<>stands with mod assist from various surface heights PT Short Term Goal 3 (Week 1): Pt will initiate gait training  Skilled Therapeutic Interventions/Progress Updates:   Session 1:  Pt in w/c and agreeable to therapy, pain 8/10 in L knee (RN aware and bringing medication). Educated on rest, ice, and elevation for pain management of L knee, suspect 2/2 over-use however pt states he thinks he may have bumped it on something during a transfer. MD aware of acute L knee pain. Pt self-propelled w/c to/from therapy gym w/ BUEs. Squat pivot w/c to mat w/ min assist and verbal cues for set-up of transfer w/ w/c and UE placement. Worked on RLE strengthening as detailed below, tactile and verbal cues throughout for technique to isolate musculature. Brief rest breaks in between sets. Amputee education on desensitization techniques in preparation for prosthetic use, pt verbalized understanding tolerated touch and light pressure to residual limb well. Briefly introduced residual limb care w/ wrapping techniques and discussed use of shrinker sock for shaping. Returned to room and ended session in w/c, all needs in reach. Ice applied to L lateral knee for pain relief and swelling management. Switched to L ELR as well.   RLE strengthening: -SAQs 3x10 -knee-to-chest 3x15 -hamstring curls in seated 3x10 -sidelying hip abduction 3x10 -sidelying hip extension 3x10  Session 2: Pt in w/c and agreeable to therapy, pain 7/10 in L knee. Continues to state he does not feel he can stand 2/2 pain at this time. Session  focused on w/c mobility in household environment while navigating cones both forwards and backwards. Occasional verbal and visual cues for technique w/ turning and backing up. Returned to room via w/c, ended session in w/c and all needs in reach. Ice applied to L lateral knee for pain relief.   Therapy Documentation Precautions:  Precautions Precautions: Fall Precaution Comments: wear R leg brace at all times Required Braces or Orthoses: Other Brace Other Brace: residual limb splint Restrictions Weight Bearing Restrictions: (P) Yes RLE Weight Bearing: (P) Non weight bearing   Therapy/Group: Individual Therapy  Trequan Marsolek K Tane Biegler 11/24/2018, 10:00 AM

## 2018-11-25 LAB — GLUCOSE, CAPILLARY
Glucose-Capillary: 109 mg/dL — ABNORMAL HIGH (ref 70–99)
Glucose-Capillary: 81 mg/dL (ref 70–99)
Glucose-Capillary: 105 mg/dL — ABNORMAL HIGH (ref 70–99)
Glucose-Capillary: 154 mg/dL — ABNORMAL HIGH (ref 70–99)

## 2018-11-25 NOTE — Progress Notes (Signed)
[place] PHYSICAL MEDICINE & REHABILITATION PROGRESS NOTE  Subjective/Complaints: Left knee feeling better with ice and voltaren gel. No other issues. Slept well  ROS: Patient denies fever, rash, sore throat, blurred vision, nausea, vomiting, diarrhea, cough, shortness of breath or chest pain,  headache, or mood change.    Objective: Vital Signs: Blood pressure 120/71, pulse 65, temperature 98.3 F (36.8 C), resp. rate 18, height 6\' 4"  (1.93 m), weight 89.8 kg, SpO2 98 %. No results found. Recent Labs    11/23/18 0557  WBC 12.3*  HGB 7.7*  HCT 24.3*  PLT 406*   No results for input(s): NA, K, CL, CO2, GLUCOSE, BUN, CREATININE, CALCIUM in the last 72 hours.  Physical Exam: BP 120/71 (BP Location: Left Arm)   Pulse 65   Temp 98.3 F (36.8 C)   Resp 18   Ht 6\' 4"  (1.93 m)   Wt 89.8 kg   SpO2 98%   BMI 24.10 kg/m  Constitutional: No distress . Vital signs reviewed. HEENT: EOMI, oral membranes moist Neck: supple Cardiovascular: RRR without murmur. No JVD    Respiratory: CTA Bilaterally without wheezes or rales. Normal effort    GI: BS +, non-tender, non-distended  Musc: Right stump with tenderness, unchanged, left knee with minimal effusion, some tenderness with flex/ext still Neurological: Alert and oriented Motor: Bilateral upper extremities: 5/5 proximal distal Left lower extremity: 3/5 proximal to distal, stable. Right lower extremity: Hip flexion 4-/5 hip flexion, stable Sensation diminished to light touch left foot Skin: right stump dressed, limb guard in place Psychiatric: pleasant  Assessment/Plan: 1. Functional deficits secondary to right BKA which require 3+ hours per day of interdisciplinary therapy in a comprehensive inpatient rehab setting.  Physiatrist is providing close team supervision and 24 hour management of active medical problems listed below.  Physiatrist and rehab team continue to assess barriers to discharge/monitor patient progress toward  functional and medical goals  Care Tool:  Bathing    Body parts bathed by patient: Face, Left upper leg, Right upper leg, Buttocks, Front perineal area, Abdomen, Chest, Left arm, Right arm   Body parts bathed by helper: Left lower leg Body parts n/a: Right lower leg   Bathing assist Assist Level: Minimal Assistance - Patient > 75%     Upper Body Dressing/Undressing Upper body dressing   What is the patient wearing?: Pull over shirt    Upper body assist Assist Level: Supervision/Verbal cueing    Lower Body Dressing/Undressing Lower body dressing      What is the patient wearing?: Pants     Lower body assist Assist for lower body dressing: Maximal Assistance - Patient 25 - 49%     Toileting Toileting    Toileting assist Assist for toileting: Maximal Assistance - Patient 25 - 49%     Transfers Chair/bed transfer  Transfers assist     Chair/bed transfer assist level: Moderate Assistance - Patient 50 - 74%     Locomotion Ambulation   Ambulation assist   Ambulation activity did not occur: Safety/medical concerns  Assist level: Minimal Assistance - Patient > 75% Assistive device: Walker-rolling Max distance: 7'   Walk 10 feet activity   Assist  Walk 10 feet activity did not occur: Safety/medical concerns        Walk 50 feet activity   Assist Walk 50 feet with 2 turns activity did not occur: Safety/medical concerns         Walk 150 feet activity   Assist Walk 150 feet activity did  not occur: Safety/medical concerns         Walk 10 feet on uneven surface  activity   Assist Walk 10 feet on uneven surfaces activity did not occur: Safety/medical concerns         Wheelchair     Assist Will patient use wheelchair at discharge?: Yes Type of Wheelchair: Manual    Wheelchair assist level: Supervision/Verbal cueing Max wheelchair distance: 150    Wheelchair 50 feet with 2 turns activity    Assist        Assist Level:  Set up assist   Wheelchair 150 feet activity     Assist     Assist Level: Set up assist      Medical Problem List and Plan: 1.Decreased functional mobilitysecondary to 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 Antithrombotics: -DVT/anticoagulation:SCDs left lower extremity -antiplatelet therapy: Await plan to begin aspirin 3. Pain Management:Neurontin 300 mg 3 times daily, Robaxin and oxycodone as needed  -added voltaren gel for left knee pain, ice 4. Mood:Provide emotional support -antipsychotic agents: N/A 5. Neuropsych: This patientiscapable of making decisions on hisown behalf. 6. Skin/Wound Care:  Dressing, limb guard, compression to stump 7. Fluids/Electrolytes/Nutrition:Routine in and out 8.Acute blood loss anemia.   Patient refused any GI work-up on acute floor.  Hemoglobin 7.7 on 6/26, continues to trend down, labs ordered for Monday.   No signs of blood loss  Asymptomatic at present 9. Acute onset atrial fibrillation. Amiodarone 200 mg twice daily. Cardiology follow-up 10. Chronic diastolic congestive heart failure. Lasix 40 mg daily Filed Weights   11/23/18 0619 11/24/18 0350 11/25/18 0505  Weight: 96 kg 90 kg 89.8 kg   Stable 6/28 11. Diabetes mellitus with peripheral neuropathy. Hemoglobin A1c 5.7. Glucotrol 5 mg daily,NovoLog 3 units 3 times daily, Levemir 8 units nightly. Check blood sugars before meals and at bedtime  CBG's better after isolated high reading on Friday--continue current plan  Cover with SSI for now before changing 12. CKD stage II.   Creatinine 1.22 on 6/24, labs ordered for Monday  Continue to monitor 13. Constipation. Laxative assistance 14.  Hypoalbuminemia  Supplement initiated on 6/24 15.  Leukocytosis  WBC 12.3 on 6/26, labs ordered for Monday  Afebrile  Continue to monitor  LOS: 5 days A FACE TO FACE EVALUATION WAS  PERFORMED  Ranelle OysterZachary T Talasia Saulter 11/25/2018, 9:01 AM

## 2018-11-26 ENCOUNTER — Inpatient Hospital Stay (HOSPITAL_COMMUNITY): Payer: Medicare Other | Admitting: Occupational Therapy

## 2018-11-26 ENCOUNTER — Inpatient Hospital Stay (HOSPITAL_COMMUNITY): Payer: Medicare Other | Admitting: Physical Therapy

## 2018-11-26 LAB — BASIC METABOLIC PANEL
Anion gap: 12 (ref 5–15)
BUN: 33 mg/dL — ABNORMAL HIGH (ref 8–23)
CO2: 26 mmol/L (ref 22–32)
Calcium: 9.3 mg/dL (ref 8.9–10.3)
Chloride: 98 mmol/L (ref 98–111)
Creatinine, Ser: 1.61 mg/dL — ABNORMAL HIGH (ref 0.61–1.24)
GFR calc Af Amer: 49 mL/min — ABNORMAL LOW (ref >60)
GFR calc non Af Amer: 42 mL/min — ABNORMAL LOW (ref >60)
Glucose, Bld: 93 mg/dL (ref 70–99)
Potassium: 4.8 mmol/L (ref 3.5–5.1)
Sodium: 136 mmol/L (ref 135–145)

## 2018-11-26 LAB — CBC WITH DIFFERENTIAL/PLATELET
Abs Immature Granulocytes: 0.02 10*3/uL (ref 0.00–0.07)
Basophils Absolute: 0.1 10*3/uL (ref 0.0–0.1)
Basophils Relative: 1 %
Eosinophils Absolute: 0.2 10*3/uL (ref 0.0–0.5)
Eosinophils Relative: 2 %
HCT: 28.1 % — ABNORMAL LOW (ref 39.0–52.0)
Hemoglobin: 8.8 g/dL — ABNORMAL LOW (ref 13.0–17.0)
Immature Granulocytes: 0 %
Lymphocytes Relative: 36 %
Lymphs Abs: 3.6 10*3/uL (ref 0.7–4.0)
MCH: 29.2 pg (ref 26.0–34.0)
MCHC: 31.3 g/dL (ref 30.0–36.0)
MCV: 93.4 fL (ref 80.0–100.0)
Monocytes Absolute: 0.5 10*3/uL (ref 0.1–1.0)
Monocytes Relative: 5 %
Neutro Abs: 5.7 10*3/uL (ref 1.7–7.7)
Neutrophils Relative %: 56 %
Platelets: 470 10*3/uL — ABNORMAL HIGH (ref 150–400)
RBC: 3.01 MIL/uL — ABNORMAL LOW (ref 4.22–5.81)
RDW: 13.2 % (ref 11.5–15.5)
WBC: 10 10*3/uL (ref 4.0–10.5)
nRBC: 0 % (ref 0.0–0.2)

## 2018-11-26 LAB — GLUCOSE, CAPILLARY
Glucose-Capillary: 108 mg/dL — ABNORMAL HIGH (ref 70–99)
Glucose-Capillary: 86 mg/dL (ref 70–99)
Glucose-Capillary: 102 mg/dL — ABNORMAL HIGH (ref 70–99)
Glucose-Capillary: 147 mg/dL — ABNORMAL HIGH (ref 70–99)

## 2018-11-26 NOTE — Progress Notes (Signed)
[place] PHYSICAL MEDICINE & REHABILITATION PROGRESS NOTE  Subjective/Complaints: Patient seen sitting up in the chair working with therapy this morning.  He states he slept well overnight.  He notes improvement in left knee pain with Voltaren gel and ice.  Discussed with therapies, improved left lower extremity function.  ROS: Denies CP, SOB, N/V/D  Objective: Vital Signs: Blood pressure 123/63, pulse 66, temperature 98.2 F (36.8 C), temperature source Oral, resp. rate 17, height 6\' 4"  (1.93 m), weight 91.3 kg, SpO2 99 %. No results found. No results for input(s): WBC, HGB, HCT, PLT in the last 72 hours. No results for input(s): NA, K, CL, CO2, GLUCOSE, BUN, CREATININE, CALCIUM in the last 72 hours.  Physical Exam: BP 123/63 (BP Location: Left Arm)   Pulse 66   Temp 98.2 F (36.8 C) (Oral)   Resp 17   Ht 6\' 4"  (1.93 m)   Wt 91.3 kg   SpO2 99%   BMI 24.50 kg/m  Constitutional: No distress . Vital signs reviewed. HENT: Normocephalic.  Atraumatic. Eyes: EOMI. No discharge. Cardiovascular: No JVD. Respiratory: Normal effort. GI: Non-distended. Musc: Right stump with edema and tenderness tenderness Neurological: Alert and oriented Motor: Bilateral upper extremities: 5/5 proximal distal, stable Left lower extremity: 4/5 proximal to distal, stable. Right lower extremity: Hip flexion 4/5 hip flexion Skin: right stump with dressing C/D/I Psychiatric: Normal mood.  Normal behavior.  Assessment/Plan: 1. Functional deficits secondary to right BKA which require 3+ hours per day of interdisciplinary therapy in a comprehensive inpatient rehab setting.  Physiatrist is providing close team supervision and 24 hour management of active medical problems listed below.  Physiatrist and rehab team continue to assess barriers to discharge/monitor patient progress toward functional and medical goals  Care Tool:  Bathing    Body parts bathed by patient: Face, Left upper leg, Right upper  leg, Buttocks, Front perineal area, Abdomen, Chest, Left arm, Right arm   Body parts bathed by helper: Left lower leg Body parts n/a: Right lower leg   Bathing assist Assist Level: Minimal Assistance - Patient > 75%     Upper Body Dressing/Undressing Upper body dressing   What is the patient wearing?: Pull over shirt    Upper body assist Assist Level: Supervision/Verbal cueing    Lower Body Dressing/Undressing Lower body dressing      What is the patient wearing?: Pants     Lower body assist Assist for lower body dressing: Maximal Assistance - Patient 25 - 49%     Toileting Toileting    Toileting assist Assist for toileting: Moderate Assistance - Patient 50 - 74%     Transfers Chair/bed transfer  Transfers assist     Chair/bed transfer assist level: Moderate Assistance - Patient 50 - 74%     Locomotion Ambulation   Ambulation assist   Ambulation activity did not occur: Safety/medical concerns  Assist level: Minimal Assistance - Patient > 75% Assistive device: Walker-rolling Max distance: 7'   Walk 10 feet activity   Assist  Walk 10 feet activity did not occur: Safety/medical concerns        Walk 50 feet activity   Assist Walk 50 feet with 2 turns activity did not occur: Safety/medical concerns         Walk 150 feet activity   Assist Walk 150 feet activity did not occur: Safety/medical concerns         Walk 10 feet on uneven surface  activity   Assist Walk 10 feet on uneven surfaces  activity did not occur: Safety/medical concerns         Wheelchair     Assist Will patient use wheelchair at discharge?: Yes Type of Wheelchair: Manual    Wheelchair assist level: Supervision/Verbal cueing Max wheelchair distance: 150    Wheelchair 50 feet with 2 turns activity    Assist        Assist Level: Set up assist   Wheelchair 150 feet activity     Assist     Assist Level: Set up assist      Medical Problem  List and Plan: 1.Decreased functional mobilitysecondary to 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 Antithrombotics: -DVT/anticoagulation:SCDs left lower extremity -antiplatelet therapy: Await plan to begin aspirin, will follow-up 3. Pain Management:Neurontin 300 mg 3 times daily, Robaxin and oxycodone as needed  Voltaren gel for left knee pain, ice 4. Mood:Provide emotional support -antipsychotic agents: N/A 5. Neuropsych: This patientiscapable of making decisions on hisown behalf. 6. Skin/Wound Care:  Continue dressing changes 7. Fluids/Electrolytes/Nutrition:Routine in and out 8.Acute blood loss anemia.   Patient refused any GI work-up on acute floor.  Hemoglobin 7.7 on 6/26, continues to trend down, labs ordered for Monday.   No signs of blood loss  Asymptomatic at present 9. Acute onset atrial fibrillation. Amiodarone 200 mg twice daily. Cardiology follow-up 10. Chronic diastolic congestive heart failure. Lasix 40 mg daily Filed Weights   11/24/18 0350 11/25/18 0505 11/26/18 0332  Weight: 90 kg 89.8 kg 91.3 kg   Stable 6/29 11. Diabetes mellitus with peripheral neuropathy. Hemoglobin A1c 5.7. Glucotrol 5 mg daily,NovoLog 3 units 3 times daily, Levemir 8 units nightly. Check blood sugars before meals and at bedtime  Slightly labile on 6/29  Cover with SSI for now before changing 12. CKD stage II.   Creatinine 1.22 on 6/24, labs pending  Continue to monitor 13. Constipation. Laxative assistance 14.  Hypoalbuminemia  Supplement initiated on 6/24 15.  Leukocytosis  WBC 12.3 on 6/26, labs pending  Afebrile  Continue to monitor  LOS: 6 days A FACE TO FACE EVALUATION WAS PERFORMED  Ankit Karis Jubanil Patel 11/26/2018, 9:49 AM

## 2018-11-26 NOTE — Progress Notes (Signed)
Occupational Therapy Session Note  Patient Details  Name: Joseph [name] MRN: [id number] Date of Birth: [birth year]  Today's Date: 11/26/2018  Session 1 OT Individual Time: 1694-5038 OT Individual Time Calculation (min): 38 min   Session 2 OT Individual Time: 8828-0034 OT Individual Time Calculation (min): 69 min    Short Term Goals: Week 1:  OT Short Term Goal 1 (Week 1): Pt will sit to stand wit MOD A in prep for cloting management OT Short Term Goal 2 (Week 1): Pt will transfer wiht S to toilet wiht LRAD OT Short Term Goal 3 (Week 1): Pt will manage pants past hips prior to toileting wiht CGA OT Short Term Goal 4 (Week 1): Pt will thread BLE into pants wiht AE PRn  Skilled Therapeutic Interventions/Progress Updates:  Session 1   Pt greeted semi-reclined in bed and agreeable to OT treatment session. Pt declined bathing/dressing this morning and states L knee pain has improved. Pt still with limited L knee flexion making it difficult for him to don TED hose and shoe-pt needed max A for this task. Worked on lateral transfers with focus on power up and bottom clearance. Pt with much improved clearance during transfer today. Pt propelled wc to therapy gym with supervision. Pt competed lateral transfer to mat with supervision. Worked on sit<>stand w/ RW from raised mat with min A. Pt reported increased pain in L residual limb with stumpr protector on.  Stump protector removed and practiced sit<>stand again x 4. Pt tolerated standing from 45seconds-1 minute pending fatigue. Encouraged pt to massage R LE for pain management. Squat-pivot back to wc with CGA. Pt handoff to PT for next therapy session.   Session 2 Pt greeted seated in wc and agreeable to OT treatment session. Pt reports R residual limb pain at 8-9/10, but pt agreeable to try to participate in therapy. OT unable to fine nursing, but notified nurse tech to let nursing know pt needs pain medicine. Pt propelled wc to therapy gym. OT set-up  obstacle course for wc management. Pt completed obstacle course weaving in and out of cones. Worked on pt managing wc parts with min verbal cues including arm rests and leg rests. UB strengthening using 3lb dowel rod 4 sets of 15, bicep curl, straight arm raise, and chest press. Sit<>stands from regular height mat with min A. Worked on balance with alternating removing UEs with min A for balance. Worked on side stepping along mat w/ RW and min A. Pt then took rest break, then was able to stand-pivot to wc with RW and min A. Pt propelled wc back to room and left seated in wc with safety alarm belt on and needs met.   Therapy Documentation Precautions:  Precautions Precautions: Fall Precaution Comments: wear R leg brace at all times Required Braces or Orthoses: Other Brace Other Brace: residual limb splint Restrictions Weight Bearing Restrictions: No RLE Weight Bearing: Non weight bearing Pain: Pain Assessment Pain Scale: 0-10 Pain Score: 8 Faces Pain Scale: Hurts a little bit Pain Intervention(s): Repositioned, nursing notified  Therapy/Group: Individual Therapy  Valma Cava 11/26/2018, 11:18 AM

## 2018-11-26 NOTE — Progress Notes (Signed)
Physical Therapy Session Note  Patient Details  Name: Joseph [name] MRN: [id number] Date of Birth: Oct 03, 1945  Today's Date: 11/26/2018 PT Individual Time: 0815-0900 AND 1415-1445 PT Individual Time Calculation (min): 45 min AND 30 min  Short Term Goals: Week 1:  PT Short Term Goal 1 (Week 1): Pt will perform squat pivot transfer with CGA from bed<>w/c PT Short Term Goal 2 (Week 1): Pt will perform sit<>stands with mod assist from various surface heights PT Short Term Goal 3 (Week 1): Pt will initiate gait training  Skilled Therapeutic Interventions/Progress Updates:   Session 1:  Pt received in w/c, handoff from OT and agreeable to therapy, pain 9/10 in R residual limb - RN made aware. Self-propelled w/c to/from therapy gym w/ supervision using BUEs, close supervision squat pivot to edge of mat. Pt able to set-up w/c for transfer correctly w/o prompting from this therapist. Per pt, his ramp was built this weekend. Discussed any other home layout barriers to d/c including doorway widths and maneuvering around w/ a w/c. Discussed using RW to access bed or toilet if his w/c does not fit through doorways inside of house. Sit>supine on mat w/ supervision. Worked on RLE strengthening and gentle ROM exercises this session. Knee-to-chest 3x10, SAQs 3x10, glut sets 3x10, and passive knee flexion stretching and hip flexor stretching 30-60 sec x3 each. Verbal and tactile cues for relaxation of R knee musculature for pain relief, pt very guarded and tense this morning. Returned to room and ended session in w/c, all needs in reach. Ice applied to L knee for pain relief.   Session 2:  Pt in w/c and agreeable to therapy, pain 4/10 in L knee (premedicated). Pt self-propelled w/c to/from therapy gym w/ supervision. Supervision squat pivot to mat, pt performed all w/c parts management w/o assist from therapist. Worked on sit<>stands to RW from mat., CGA x2 reps. Ambulated 15' x2 w/ RW and CGA. Discussed needing to  ambulate this distance to access bed or toilet if his w/c does not fit through doorways. Pt very encouraged by his progress. Returned to w/c and pt needed min verbal cues to put leg rests back on w/c. Returned to room via w/c, ended session in w/c and all needs in reach.   Therapy Documentation Precautions:  Precautions Precautions: Fall Precaution Comments: wear R leg brace at all times Required Braces or Orthoses: Other Brace Other Brace: residual limb splint Restrictions Weight Bearing Restrictions: Yes RLE Weight Bearing: Non weight bearing  Therapy/Group: Individual Therapy  Quincey Nored K Dore Oquin 11/26/2018, 9:05 AM

## 2018-11-27 ENCOUNTER — Inpatient Hospital Stay (HOSPITAL_COMMUNITY): Payer: Medicare Other | Admitting: Occupational Therapy

## 2018-11-27 ENCOUNTER — Encounter (HOSPITAL_COMMUNITY): Payer: Medicare Other | Admitting: Psychology

## 2018-11-27 ENCOUNTER — Inpatient Hospital Stay (HOSPITAL_COMMUNITY): Payer: Medicare Other | Admitting: Rehabilitation

## 2018-11-27 ENCOUNTER — Inpatient Hospital Stay (HOSPITAL_COMMUNITY): Payer: Medicare Other | Admitting: Physical Therapy

## 2018-11-27 DIAGNOSIS — N179 Acute kidney failure, unspecified: Secondary | ICD-10-CM

## 2018-11-27 LAB — GLUCOSE, CAPILLARY
Glucose-Capillary: 109 mg/dL — ABNORMAL HIGH (ref 70–99)
Glucose-Capillary: 110 mg/dL — ABNORMAL HIGH (ref 70–99)
Glucose-Capillary: 131 mg/dL — ABNORMAL HIGH (ref 70–99)

## 2018-11-27 NOTE — Progress Notes (Signed)
Occupational Therapy Weekly Progress Note  Patient Details  Name: Joseph [name] MRN: [id number] Date of Birth: 09/20/1945  Beginning of progress report period: November 21, 2018 End of progress report period: November 27, 2018  Today's Date: 11/27/2018 OT Individual Time: 6063-0160 OT Individual Time Calculation (min): 70 min    Patient has met 4 of 4 short term goals.  Pt has made great progress towards OT goals this week. Pt can complete squat-pivot or lateral scooting transfers with supervision. Sit<>stands and stand pivot transfers have progressed to CGA overall. Pt is supervision for most of his BADL tasks, but CGA for bathroom transfers. Continue current POC.  Patient continues to demonstrate the following deficits: muscle weakness and decreased standing balance and decreased balance strategies and therefore will continue to benefit from skilled OT intervention to enhance overall performance with BADL.  Patient progressing toward long term goals..  Continue plan of care.  OT Short Term Goals Week 1:  OT Short Term Goal 1 (Week 1): Pt will sit to stand wit MOD A in prep for cloting management OT Short Term Goal 1 - Progress (Week 1): Met OT Short Term Goal 2 (Week 1): Pt will transfer wiht S to toilet wiht LRAD OT Short Term Goal 2 - Progress (Week 1): Met OT Short Term Goal 3 (Week 1): Pt will manage pants past hips prior to toileting wiht CGA OT Short Term Goal 3 - Progress (Week 1): Met OT Short Term Goal 4 (Week 1): Pt will thread BLE into pants wiht AE PRn OT Short Term Goal 4 - Progress (Week 1): Met Week 2:  OT Short Term Goal 1 (Week 2): LTG=STG 2/2 ELOS  Skilled Therapeutic Interventions/Progress Updates:    Pt greeted sitting in wc and agreeable to shower today. Pt assisted OT with doffing shrinker sock, then OT re-wrapped R LE with ace wrap prior to placing water proof bag over residual limb. Pt completed stand-pivot transfer into shower with use of grab bars and min A. Bathing  completed using leaning method to wash buttocks and min A to access LLE. Pt able to complete LB dressing using leaning method again to pull up pants. OT assisted with donning L TED hose, then pt able to tolerate more L knee flexion today to then don his shoe and tie it. Grooming tasks completed wc at the sink w supervision. Pt propelled wc to therapy gym and OT provided pt with wc gloves for better grip strength and hand protection. Pt propelled wc through obstacle course with practice turning wc around tighter corners. Pt propelled wc back to room and left seated in wc with needs met.   Therapy Documentation Precautions:  Precautions Precautions: Fall Precaution Comments: wear R leg brace at all times Required Braces or Orthoses: Other Brace Other Brace: residual limb splint Restrictions Weight Bearing Restrictions: Yes RLE Weight Bearing: Non weight bearing Pain:  denies pain  Therapy/Group: Individual Therapy  Valma Cava 11/27/2018, 8:52 AM

## 2018-11-27 NOTE — Consult Note (Signed)
Neuropsychological Consultation   Patient:   Joseph [name]   DOB:   August 07, 1945  MR Number:  [id number]  Location:  [facility] [address] [facility] [place] [id number] [place] [address] Dept: [place]: [phone]           Date of Service:   11/27/2018  Start Time:   2 PM End Time:   3 PM  Provider/Observer:  Ilean Skill, Psy.D.       Clinical Neuropsychologist       Billing Code/Service: [phone]  Chief Complaint:    Joseph [name] is a 73 year old male with history of diabetes, hypertension, chronic diastolic CHF, CKD.  Patient presented 11/13/2018 with right foot ischemic changes with nausea.  X-ray revealed soft tissue ulcer.  MRI gas in the fifth metatarsal head consistent with osteomyelitis.  Dr. 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  Below is the HPI for the current admission.  JOA:CZYSAYT [name] a 73 year old right-handed male with history of type 2 diabetes mellitus, hypertension,chronic diastolic congestive heart failure,CKD stage II. Per chart review patient lives alone independent prior to admission. Presented 11/13/2018 with right foot ischemic changes with nausea. Patient reportedly had scraped his foot approximately 2 months ago while doing some tree work. X-rays revealed soft tissue ulcer about the fifth metatarsophagealjoint with soft tissue air. No osteomyelitis. MRI of the foot completed again gas in the fifth metatarsal head consistent with osteomyelitis. Follow-up orthopedic service Dr. Sharol Given initially with conservative care. Hospital course complicated by anemia hemoglobin 6.7 with gastroenterology consulted 11/14/2018 recommendations of colonoscopy  of which patient refused and maintained on PPI with recommendation to transfuse if symptomatic. 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 Sharol Given. Hospital course pain management as well as leukocytosis 30,500 maintained on antibiotic therapy. Patient developed atrial fibrillation with runs of SVT cardiology services consulted 11/15/2018 and he did receive IV Lopressor and Cardizem with little response with IV amiodarone started and patient converted to sinus rhythm on the evening of 11/15/2018. Plan was to begin aspirin on discharge. Echocardiogram with ejection fraction of 55% moderate hypokinesis of the left ventricular, entire anterior septal wall. Patient placed on amiodarone 200 mg twice daily cardiac rate controlled. Leukocytosis improved 16,000 antibiotic therapy has been discontinued. Hemoglobin continues to stabilize 8.5. Therapy evaluations completed and patient was admitted for a comprehensive rehab program.  Current Status:  Patient reports that initially he was worried about how the BKA would impact his life but understands why needed and his mood has improved and remains motivated for therapy and looking forward to prosthetic.    Behavioral Observation: Joseph [name]  presents as a 73 y.o.-year-old Right African American Male who appeared his stated age. his dress was Appropriate and he was Well Groomed and his manners were Appropriate to the situation.  his participation was indicative of Appropriate and Attentive behaviors.  There were any physical disabilities noted.  he displayed an appropriate level of cooperation and motivation.     Interactions:    Active Appropriate and Attentive  Attention:   within normal limits and attention span and concentration were age appropriate  Memory:   within normal limits; recent and remote memory  intact  Visuo-spatial:  not examined  Speech  (Volume):  normal  Speech:   normal; normal  Thought Process:  Coherent and Relevant  Though Content:  WNL; not suicidal and not homicidal  Orientation:   person, place, time/date and situation  Judgment:   Good  Planning:   Good  Affect:    Appropriate  Mood:    Euthymic  Insight:   Good  Intelligence:   normal  Medical History:   Past Medical History:  Diagnosis Date  . Diabetes mellitus type 2 in nonobese (HCC)   . Hypertension    Psychiatric History:  No prior psychiatric history.  Family Med/Psych History:  Family History  Problem Relation Age of Onset  . Diabetes Father     Impression/DX:  Joseph [name] is a 73 year old male with history of diabetes, hypertension, chronic diastolic CHF, CKD.  Patient presented 11/13/2018 with right foot ischemic changes with nausea.  X-ray revealed soft tissue ulcer.  MRI gas in the fifth metatarsal head consistent with osteomyelitis.  Dr. 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  Patient reports that initially he was worried about how the BKA would impact his life but understands why needed and his mood has improved and remains motivated for therapy and looking forward to prosthetic.   Disposition/Plan:  Will follow up if needed but patient appears to be progressing well with good motivation and no indication of significant anxiety, depression or other psychological or behavioral issues.    Diagnosis:   BKA        Electronically Signed   _______________________ Arley PhenixJohn , Psy.D.

## 2018-11-27 NOTE — Progress Notes (Signed)
Physical Therapy Weekly Progress Note  Patient Details  Name: Joseph [name] MRN: [id number] Date of Birth: 1946-01-04  Beginning of progress report period: November 21, 2018 End of progress report period: November 27, 2018  Patient has met 3 of 3 short term goals. Pt is making excellent progress towards LTGs. Pt is performing most mobility w/ CGA and occasionally needing up to min assist to stand from lower surfaces. He is ambulating up to 30' w/ RW as well. Pt is motivated and very receptive to all education regarding residual limb management and amputee education in general.   Patient continues to demonstrate the following deficits muscle weakness, decreased cardiorespiratoy endurance and decreased standing balance and decreased balance strategies and therefore will continue to benefit from skilled PT intervention to increase functional independence with mobility.  Patient progressing toward long term goals..  Continue plan of care. LTGs upgraded to supervision overall.   PT Short Term Goals Week 1:  PT Short Term Goal 1 (Week 1): Pt will perform squat pivot transfer w/ CGA from bed<>chair PT Short Term Goal 1 - Progress (Week 1): Met PT Short Term Goal 2 (Week 1): Pt will perform sit<>stands w/ mod assist from various surface heights PT Short Term Goal 2 - Progress (Week 1): Met PT Short Term Goal 3 (Week 1): Pt will initiate gait training PT Short Term Goal 3 - Progress (Week 1): Met Week 2:  PT Short Term Goal 1 (Week 2): =LTGs due to ELOS  Joseph [name] 11/27/2018, 2:49 PM

## 2018-11-27 NOTE — Progress Notes (Signed)
[facility] PHYSICAL MEDICINE & REHABILITATION PROGRESS NOTE  Subjective/Complaints: Patient seen sitting up in bed this morning.  He states he slept well overnight.  ROS: Denies CP, SOB, N/V/D  Objective: Vital Signs: Blood pressure 103/66, pulse 63, temperature 98.5 F (36.9 C), temperature source Oral, resp. rate 18, height 6\' 4"  (1.93 m), weight 90.4 kg, SpO2 97 %. No results found. Recent Labs    11/26/18 1058  WBC 10.0  HGB 8.8*  HCT 28.1*  PLT 470*   Recent Labs    11/26/18 1058  NA 136  K 4.8  CL 98  CO2 26  GLUCOSE 93  BUN 33*  CREATININE 1.61*  CALCIUM 9.3    Physical Exam: BP 103/66 (BP Location: Left Arm)   Pulse 63   Temp 98.5 F (36.9 C) (Oral)   Resp 18   Ht 6\' 4"  (1.93 m)   Wt 90.4 kg   SpO2 97%   BMI 24.26 kg/m  Constitutional: No distress . Vital signs reviewed. HENT: Normocephalic.  Atraumatic. Eyes: EOMI.  No discharge. Cardiovascular: No JVD. Respiratory: Normal effort. GI: Non-distended. Musc: Right stump with edema and tenderness tenderness Neurological: Alert and oriented Motor: Bilateral upper extremities: 5/5 proximal distal, unchanged Left lower extremity: 4/5 proximal to distal, unchanged. Right lower extremity: Hip flexion 4/5 hip flexion, unchanged Skin: right stump some ischemia along staple lines and central posterior flap Psychiatric: Normal mood.  Normal behavior.  Assessment/Plan: 1. Functional deficits secondary to right BKA which require 3+ hours per day of interdisciplinary therapy in a comprehensive inpatient rehab setting.  Physiatrist is providing close team supervision and 24 hour management of active medical problems listed below.  Physiatrist and rehab team continue to assess barriers to discharge/monitor patient progress toward functional and medical goals  Care Tool:  Bathing    Body parts bathed by patient: Face, Left upper leg, Right upper leg, Buttocks, Front perineal area, Abdomen, Chest, Left arm,  Right arm   Body parts bathed by helper: Left lower leg Body parts n/a: Right lower leg   Bathing assist Assist Level: Minimal Assistance - Patient > 75%     Upper Body Dressing/Undressing Upper body dressing   What is the patient wearing?: Pull over shirt    Upper body assist Assist Level: Supervision/Verbal cueing    Lower Body Dressing/Undressing Lower body dressing      What is the patient wearing?: Pants     Lower body assist Assist for lower body dressing: Maximal Assistance - Patient 25 - 49%     Toileting Toileting    Toileting assist Assist for toileting: Moderate Assistance - Patient 50 - 74%     Transfers Chair/bed transfer  Transfers assist     Chair/bed transfer assist level: Contact Guard/Touching assist     Locomotion Ambulation   Ambulation assist   Ambulation activity did not occur: Safety/medical concerns  Assist level: Contact Guard/Touching assist Assistive device: Walker-rolling Max distance: 15'   Walk 10 feet activity   Assist  Walk 10 feet activity did not occur: Safety/medical concerns  Assist level: Contact Guard/Touching assist Assistive device: Walker-rolling   Walk 50 feet activity   Assist Walk 50 feet with 2 turns activity did not occur: Safety/medical concerns         Walk 150 feet activity   Assist Walk 150 feet activity did not occur: Safety/medical concerns         Walk 10 feet on uneven surface  activity   Assist Walk 10 feet  on uneven surfaces activity did not occur: Safety/medical concerns         Wheelchair     Assist Will patient use wheelchair at discharge?: Yes Type of Wheelchair: Manual    Wheelchair assist level: Supervision/Verbal cueing Max wheelchair distance: 150'    Wheelchair 50 feet with 2 turns activity    Assist        Assist Level: Supervision/Verbal cueing   Wheelchair 150 feet activity     Assist     Assist Level: Supervision/Verbal cueing       Medical Problem List and Plan: 1.Decreased functional mobilitysecondary to 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 Antithrombotics: -DVT/anticoagulation:SCDs left lower extremity  -antiplatelet therapy: n/a 3. Pain Management:Neurontin 300 mg 3 times daily, Robaxin and oxycodone as needed  Voltaren gel for left knee pain, ice  Improving 4. Mood:Provide emotional support -antipsychotic agents: N/A 5. Neuropsych: This patientiscapable of making decisions on hisown behalf. 6. Skin/Wound Care:  Continue dressing changes 7. Fluids/Electrolytes/Nutrition:Routine in and out 8.Acute blood loss anemia.   Patient refused any GI work-up on acute floor.  Hemoglobin 8.8 on 6/29  Asymptomatic at present 9. Acute onset atrial fibrillation. Amiodarone 200 mg twice daily. Cardiology follow-up 10. Chronic diastolic congestive heart failure. Lasix 40 mg daily Filed Weights   11/25/18 0505 11/26/18 0332 11/27/18 0422  Weight: 89.8 kg 91.3 kg 90.4 kg   Stable 6/30 11. Diabetes mellitus with peripheral neuropathy. Hemoglobin A1c 5.7. Glucotrol 5 mg daily,NovoLog 3 units 3 times daily, Levemir 8 units nightly. Check blood sugars before meals and at bedtime  Slightly labile on 6/30  Cover with SSI for now before changing 12. AKI on CKD stage II.   Creatinine 1.61 on 6/29  Encourage fluids  Continue to monitor 13. Constipation. Laxative assistance 14.  Hypoalbuminemia  Supplement initiated on 6/24 15.  Leukocytosis: Resolved  WBC 10.0 on 6/29  Afebrile  Continue to monitor  LOS: 7 days A FACE TO FACE EVALUATION WAS PERFORMED  Ankit [name] 11/27/2018, 9:53 AM

## 2018-11-27 NOTE — Progress Notes (Signed)
Physical Therapy Session Note  Patient Details  Name: Joseph [name] MRN: [id number] Date of Birth: [birth year]  Today's Date: 11/27/2018 PT Individual Time: 8677-3736 PT Individual Time Calculation (min): 64 min   Short Term Goals: Week 1:  PT Short Term Goal 1 (Week 1): Pt will perform squat pivot transfer w/ CGA from bed<>chair PT Short Term Goal 1 - Progress (Week 1): Met PT Short Term Goal 2 (Week 1): Pt will perform sit<>stands w/ mod assist from various surface heights PT Short Term Goal 2 - Progress (Week 1): Met PT Short Term Goal 3 (Week 1): Pt will initiate gait training PT Short Term Goal 3 - Progress (Week 1): Met Week 2:  PT Short Term Goal 1 (Week 2): =LTGs due to ELOS  Skilled Therapeutic Interventions/Progress Updates:   Pt received sitting in w/c in room, finishing lunch.  PT went to retreive inspection mirror to provide to patient.  PT then provided pt education on how and why to use mirror both now and when gets prosthesis.  Looking for increased erythema, wounds, blisters, etc as well as using to inspect L foot as pt is diabetic.  Pt verbalized understanding.  Pt self propelled x 100' x 2 reps to/from therapy gym at S to mod I level.  Cues for improved shoulder motion as he tends to do quick short movements.  Once in therapy gym, transferred to/from therapy mat via RW at min A level.    Seated/standing therex:  Sit<>stand from varying height mat to work on improving LLE strength and determine best hand placement for safe sit/stand and improving forward weight shift.  Performed x 4 reps from higher height, 4 reps from medium height and 3 reps from lowest height (slightly higher than w/c level).  Seated UE exercises with green theraband: Scapular retraction x 10 reps, bicep curls x 10 reps, tricep extension x 10 reps, chair dips x 2 sets of 5 reps, shoulder extension x 10 reps, ER x 10 reps, and abd x 10 reps.  All with cues for posture, shoulder and scapular depression throughout.     Pt left in w/c in room with all needs in reach.   Therapy Documentation Precautions:  Precautions Precautions: Fall Precaution Comments: wear R leg brace at all times Required Braces or Orthoses: Other Brace Other Brace: residual limb splint Restrictions Weight Bearing Restrictions: Yes RLE Weight Bearing: Non weight bearing   Pain: 0/10 pain during session, RN applied voltaren gel   Therapy/Group: Individual Therapy  Denice Bors 11/27/2018, 1:50 PM

## 2018-11-27 NOTE — Progress Notes (Signed)
Physical Therapy Session Note  Patient Details  Name: Joseph [name] MRN: [id number] Date of Birth: 1946-05-02  Today's Date: 11/27/2018 PT Individual Time: 0924-1006 PT Individual Time Calculation (min): 42 min   and  Today's Date: 11/27/2018 PT Missed Time: 18 Minutes Missed Time Reason: Nursing care;Other (Comment)(eating breakfast)  Short Term Goals: Week 1:  PT Short Term Goal 1 (Week 1): Pt will perform squat pivot transfer with CGA from bed<>w/c PT Short Term Goal 2 (Week 1): Pt will perform sit<>stands with mod assist from various surface heights PT Short Term Goal 3 (Week 1): Pt will initiate gait training  Skilled Therapeutic Interventions/Progress Updates:    Pt received supine in bed with R LE residual limb unwrapped and pt reporting that he is waiting on the RN to re-wrap his leg - pt requests that the nurse performs this rather than the therapist. Pt also reports he has not yet had an opportunity to eat breakfast and requests to do this prior to starting session. Pt very apologetic about missing therapy time stating he really wants to work hard but everything has been delayed this AM. Therapist set-up pt with breakfast and notified RN about wrapping his R LE. Missed 18 minutes of skilled physical therapy.  Therapist returned to room and pt eager to participate in therapy. Supine>sit, HOB partially elevated and using bedrails with supervision. Pt able to don/doff R LE limb guard without assist and therapist donned L shoe max assist for time management. Therapist educated pt on importance of maintaining L LE skin integrity and performing daily skin assessments. Squat pivot transfer EOB>w/c with close supervision/CGA. Performed B UE w/c propulsion ~165ft to therapy gym with supervision. Squat pivot transfer w/c>EOM with pt demonstrating proper set-up and performance of transfer with close supervision for safety. Pt removed limb guard and therapist noted shrinker not in place therefore  therapist removed ACE wrap and educated pt on importance of wearing shrinker for limb shaping. Donned shrinker with max assist using shrinker aid device. Pt performed sit<>stand EOM<>RW x2 with min assist for balance when coming to standing with cuing for proper hand placement. Ambulated ~49ft, ~31ft (seated break between) using RW with CGA for steadying throughout and cuing for improved AD management - after 1st walk pt reported lightheadedness and instructed to perform B UE overhead movements with pt reporting symptoms dissipated. Pt performed ~110ft B UE w/c propulsion back to room with supervision and pt left sitting in w/c with needs in reach and chair alarm on.   Therapy Documentation Precautions:  Precautions Precautions: Fall Precaution Comments: wear R leg brace at all times Required Braces or Orthoses: Other Brace Other Brace: residual limb splint Restrictions Weight Bearing Restrictions: Yes RLE Weight Bearing: Non weight bearing  Pain: Reports pain level in R LE 4/10 at beginning of session that increased to 8/10 by the end - RN notified for medication administration per pt request.   Therapy/Group: Individual Therapy  Tawana Scale, PT, DPT 11/27/2018, 7:51 AM

## 2018-11-28 ENCOUNTER — Inpatient Hospital Stay (HOSPITAL_COMMUNITY): Payer: Medicare Other

## 2018-11-28 LAB — GLUCOSE, CAPILLARY
Glucose-Capillary: 82 mg/dL (ref 70–99)
Glucose-Capillary: 141 mg/dL — ABNORMAL HIGH (ref 70–99)
Glucose-Capillary: 116 mg/dL — ABNORMAL HIGH (ref 70–99)
Glucose-Capillary: 95 mg/dL (ref 70–99)
Glucose-Capillary: 112 mg/dL — ABNORMAL HIGH (ref 70–99)

## 2018-11-28 NOTE — Progress Notes (Signed)
Physical Therapy Session Note  Patient Details  Name: Joseph [name] MRN: [id number] Date of Birth: 1945-12-12  Today's Date: 11/28/2018 PT Individual Time: 1010-1120 PT Individual Time Calculation (min): 70 min   Short Term Goals: Week 2:  PT Short Term Goal 1 (Week 2): =LTGs due to ELOS  Skilled Therapeutic Interventions/Progress Updates:     Patient in w/c with recreational therapist in his room upon PT arrival. Patient alert and agreeable to PT session. Participated in co-treat with recreational therapy for first 50 min of session.    Therapeutic Activity: Transfers: Patient performed sit to/from stand x5 and squat pivot transfers x3 with CGA-min A. Provided verbal cues for w/c set up (used "LBA" for leg rests, breaks, arm rest as memory tool for sequencing), and hand placement during transfers.  Gait Training:  Patient ambulated 40 feet using RW with CGA with a w/c follow. Ambulated with hop-to gait pattern on L with controlled initial contact. Provided verbal cues for relaxing shoulders and looking ahead.  Wheelchair Mobility:  Patient propelled wheelchair 150 feet with supervision. Provided verbal cues for use of breaks and donning/doffing leg rest and limb support.   Neuromuscular Re-ed: Patient performed  dynamic standing balance with RW reaching outside BOS for horseshoe task with min assist for four 4-5 min trials.     Therapeutic Exercise: Patient performed the following exercises with verbal and tactile cues for proper technique. -Standing R hip extension x10 -Standing R hip abduction x10 -Standing R hamstring curls x10  Patient in w/c at end of session with breaks locked, chair alarm set, and all needs within reach.    Therapy Documentation Precautions:  Precautions Precautions: Fall Precaution Comments: wear R leg brace at all times Required Braces or Orthoses: Other Brace Other Brace: residual limb splint Restrictions Weight Bearing Restrictions: Yes RLE  Weight Bearing: Non weight bearing General: PT Amount of Missed Time (min): 5 Minutes PT Missed Treatment Reason: Other (Comment)(Therapist late) Pain: Patient reported 5-7/10 R residual limb pain during session. PT provided repositioning, distraction, and rest breaks for pain interventions throughout session.    Therapy/Group: Individual Therapy and Co-Treatment  Macari Zalesky L Jaysa Kise PT, DPT  11/28/2018, 3:36 PM

## 2018-11-28 NOTE — Progress Notes (Signed)
[place] PHYSICAL MEDICINE & REHABILITATION PROGRESS NOTE  Subjective/Complaints: Patient seen working with therapy this morning.  He states he slept well overnight.  He denies complaints.  ROS: Denies CP, SOB, N/V/D  Objective: Vital Signs: Blood pressure 111/63, pulse 66, temperature 98.5 F (36.9 C), temperature source Oral, resp. rate 17, height 6\' 4"  (1.93 m), weight 89.3 kg, SpO2 99 %. No results found. Recent Labs    11/26/18 1058  WBC 10.0  HGB 8.8*  HCT 28.1*  PLT 470*   Recent Labs    11/26/18 1058  NA 136  K 4.8  CL 98  CO2 26  GLUCOSE 93  BUN 33*  CREATININE 1.61*  CALCIUM 9.3    Physical Exam: BP 111/63 (BP Location: Left Arm)   Pulse 66   Temp 98.5 F (36.9 C) (Oral)   Resp 17   Ht 6\' 4"  (1.93 m)   Wt 89.3 kg   SpO2 99%   BMI 23.96 kg/m  Constitutional: No distress . Vital signs reviewed. HENT: Normocephalic.  Atraumatic. Eyes: EOMI.  No discharge. Cardiovascular: No JVD. Respiratory: Normal effort. GI: Non--distended. Musc: Right stump with edema and tenderness tenderness Neurological: Alert and oriented Motor: Bilateral upper extremities: 5/5 proximal distal, stable Left lower extremity: 4/5 proximal to distal, stable. Right lower extremity: Hip flexion 4/5 hip flexion, stable Skin: right stump with dressing C/D/I Psychiatric: Normal mood.  Normal behavior.  Assessment/Plan: 1. Functional deficits secondary to right BKA which require 3+ hours per day of interdisciplinary therapy in a comprehensive inpatient rehab setting.  Physiatrist is providing close team supervision and 24 hour management of active medical problems listed below.  Physiatrist and rehab team continue to assess barriers to discharge/monitor patient progress toward functional and medical goals  Care Tool:  Bathing    Body parts bathed by patient: Face, Left upper leg, Right upper leg, Buttocks, Front perineal area, Abdomen, Chest, Left arm, Right arm   Body  parts bathed by helper: Left lower leg Body parts n/a: Right lower leg   Bathing assist Assist Level: Contact Guard/Touching assist     Upper Body Dressing/Undressing Upper body dressing   What is the patient wearing?: Pull over shirt    Upper body assist Assist Level: Supervision/Verbal cueing    Lower Body Dressing/Undressing Lower body dressing      What is the patient wearing?: Pants     Lower body assist Assist for lower body dressing: Minimal Assistance - Patient > 75%     Toileting Toileting    Toileting assist Assist for toileting: Moderate Assistance - Patient 50 - 74%     Transfers Chair/bed transfer  Transfers assist     Chair/bed transfer assist level: Minimal Assistance - Patient > 75%     Locomotion Ambulation   Ambulation assist   Ambulation activity did not occur: Safety/medical concerns  Assist level: Contact Guard/Touching assist Assistive device: Walker-rolling Max distance: 2830ft   Walk 10 feet activity   Assist  Walk 10 feet activity did not occur: Safety/medical concerns  Assist level: Contact Guard/Touching assist Assistive device: Walker-rolling   Walk 50 feet activity   Assist Walk 50 feet with 2 turns activity did not occur: Safety/medical concerns         Walk 150 feet activity   Assist Walk 150 feet activity did not occur: Safety/medical concerns         Walk 10 feet on uneven surface  activity   Assist Walk 10 feet on uneven surfaces activity  did not occur: Safety/medical concerns         Wheelchair     Assist Will patient use wheelchair at discharge?: Yes Type of Wheelchair: Manual    Wheelchair assist level: Supervision/Verbal cueing Max wheelchair distance: 164ft    Wheelchair 50 feet with 2 turns activity    Assist        Assist Level: Supervision/Verbal cueing   Wheelchair 150 feet activity     Assist     Assist Level: Supervision/Verbal cueing      Medical  Problem List and Plan: 1.Decreased functional mobilitysecondary to ischemic 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  2. Antithrombotics: -DVT/anticoagulation:SCDs left lower extremity  -antiplatelet therapy: n/a 3. Pain Management:Neurontin 300 mg 3 times daily, Robaxin and oxycodone as needed  Voltaren gel for left knee pain, ice  Improved 4. Mood:Provide emotional support -antipsychotic agents: N/A 5. Neuropsych: This patientiscapable of making decisions on hisown behalf. 6. Skin/Wound Care:  Continue dressing changes 7. Fluids/Electrolytes/Nutrition:Routine in and out 8.Acute blood loss anemia.   Patient refused any GI work-up on acute floor.  Hemoglobin 8.8 on 6/29  Asymptomatic at present 9. Acute onset atrial fibrillation. Amiodarone 200 mg twice daily. Cardiology follow-up 10. Chronic diastolic congestive heart failure. Lasix 40 mg daily Filed Weights   11/27/18 0422 11/27/18 1943 11/28/18 0500  Weight: 90.4 kg 90 kg 89.3 kg   Stable 7/1 11. Diabetes mellitus with peripheral neuropathy. Hemoglobin A1c 5.7. Glucotrol 5 mg daily,NovoLog 3 units 3 times daily, Levemir 8 units nightly. Check blood sugars before meals and at bedtime  ?  Trending up on 7/1  Cover with SSI for now before changing 12. AKI on CKD stage II.   Creatinine 1.61 on 6/29, labs ordered for tomorrow  Encourage fluids  Continue to monitor 13. Constipation. Laxative assistance 14.  Hypoalbuminemia  Supplement initiated on 6/24 15.  Leukocytosis: Resolved  WBC 10.0 on 6/29  Afebrile  Continue to monitor  LOS: 8 days A FACE TO FACE EVALUATION WAS PERFORMED   [name] 11/28/2018, 9:07 AM

## 2018-11-28 NOTE — Progress Notes (Signed)
Occupational Therapy Session Note  Patient Details  Name: Joseph [name] MRN: [id number] Date of Birth: 09/26/1945  Today's Date: 11/28/2018 OT Individual Time: 1230-1327 OT Individual Time Calculation (min): 57 min    Short Term Goals: Week 1:  OT Short Term Goal 1 (Week 1): Pt will sit to stand wit MOD A in prep for cloting management OT Short Term Goal 1 - Progress (Week 1): Met OT Short Term Goal 2 (Week 1): Pt will transfer wiht S to toilet wiht LRAD OT Short Term Goal 2 - Progress (Week 1): Met OT Short Term Goal 3 (Week 1): Pt will manage pants past hips prior to toileting wiht CGA OT Short Term Goal 3 - Progress (Week 1): Met OT Short Term Goal 4 (Week 1): Pt will thread BLE into pants wiht AE PRn OT Short Term Goal 4 - Progress (Week 1): Met  Skilled Therapeutic Interventions/Progress Updates:    1:1. Pt received in w/c recalling previous therapy sessions in detail. Pt agreeable to shower this date with residual limb bagged for water tight seal. Pt completes doffing of clothing with supervision using lateral leans. Pt completes squat pivot transfer into shower with supervision and VC for safe hand placement. Pt completes bathing seated with set up/distant supervision once seated on shower seat. Pt dresses EOB with supervision using lateral leans to advance pants past hips. Pt completes oral care seated in w/c with MOD I. Pt even able to don teds with S today with plasic bag over foot technique. Pt completes 2x10 w/c push ups superset with overhead tricep extension for BUE strengthening required for transfers. Exited session with pt seated in w/c, call light in reach and exit alarm on  Therapy Documentation Precautions:  Precautions Precautions: Fall Precaution Comments: wear R leg brace at all times Required Braces or Orthoses: Other Brace Other Brace: residual limb splint Restrictions Weight Bearing Restrictions: Yes RLE Weight Bearing: Non weight bearing General:   Vital  Signs:   Pain: Pain Assessment Pain Scale: 0-10 Pain Score: 2  Pain Type: Acute pain;Phantom pain Pain Location: Leg Pain Orientation: Right Pain Descriptors / Indicators: Aching Pain Frequency: Intermittent Pain Onset: Gradual Patients Stated Pain Goal: 0 Pain Intervention(s): Medication (See eMAR);Repositioned Multiple Pain Sites: No ADL: ADL Eating: Independent Grooming: Setup Where Assessed-Grooming: Edge of bed Upper Body Bathing: Supervision/safety Where Assessed-Upper Body Bathing: Edge of bed Lower Body Bathing: Supervision/safety Upper Body Dressing: Supervision/safety Where Assessed-Upper Body Dressing: Edge of bed Where Assessed-Lower Body Dressing: Edge of bed Toileting: Maximal cueing Where Assessed-Toileting: Bedside Commode Toilet Transfer Method: Ambulating Vision   Perception    Praxis   Exercises:   Other Treatments:     Therapy/Group: Individual Therapy  Stephanie M Schlosser 11/28/2018, 12:51 PM  

## 2018-11-28 NOTE — Consult Note (Signed)
   [facility] CM Inpatient Consult   11/28/2018  [name] 03/24/1946 [id number]   Referral:  Eligibility  Referral received from Reynold Bowen, inpatient rehab care manager regarding patient being eligible for services. Spoke with Sonia Baller regarding ACO membership criteria.  Thank you for this consult.  Patient evaluated for [place] Management services and chart reviewed.  Patient is not currently a beneficiary of the attributed [place] in the [employer]. Does not have a Primary Care Provider listed in the [facility] affiliated.   This patient is Not eligible for [facility] Care Management Services.   Reason:  Not a beneficiary currently attributed to one of the [place].  Membership roster was used to verify non- eligible status.  For questions, please contact:  Natividad Brood, RN BSN [facility] Liaison  [phone] business mobile phone Toll free office [phone]  Fax number: [phone] [email or web address]

## 2018-11-28 NOTE — Progress Notes (Signed)
Occupational Therapy Session Note  Patient Details  Name: Joseph [name] MRN: [id number] Date of Birth: 1945-09-12  Today's Date: 11/28/2018 OT Individual Time: 5277-8242 OT Individual Time Calculation (min): 60 min    Short Term Goals: Week 2:  OT Short Term Goal 1 (Week 2): LTG=STG 2/2 ELOS  Skilled Therapeutic Interventions/Progress Updates:    Pt received supine, pleasant and ready for therapy. Pt completed lateral scoot to w/c with (S). He demonstrated appropriate safety awareness during transfer and for w/c management. Pt completed grooming and oral hygiene at sink with mod I seated in w/c. Pt propelled w/c to therapy gym with (S), 125 ft. Pt transferred to mat with (S). He stood with RW with CGA. Pt completed 2x 10 sets of mini squats to challenge standing balance and LLE strength. Pt completed 2x forward and back in parallel bars with moderate cueing for activity pacing and technique.  Pt c/o pain in residual limb from limb protector. Limb protector removed and replaced ace bandage with shrinker to protect skin from straps. Pt transitioned into prone with (S). Edu pt on desensitization techniques and prone positioning for future prosthetic use. Pt completed glute activation with LLE extension in prone with mod manual facilitation. Pt returned to w/c and was returned to room, left sitting up with all needs met.   Therapy Documentation Precautions:  Precautions Precautions: Fall Precaution Comments: wear R leg brace at all times Required Braces or Orthoses: Other Brace Other Brace: residual limb splint Restrictions Weight Bearing Restrictions: Yes RLE Weight Bearing: Non weight bearing  Therapy/Group: Individual Therapy  Curtis Sites 11/28/2018, 7:17 AM

## 2018-11-28 NOTE — Progress Notes (Signed)
Recreational Therapy Session Note  Patient Details  Name: Joseph [name] MRN: [id number] Date of Birth: 07/11/1945 Today's Date: 11/28/2018 Time:10-11 Pain: c/o 5-7/10 RLE pain, premedicated.  Pt said pain was tolerable.   Skilled Therapeutic Interventions/Progress Updates: Session focused on w/c mobility, activity tolerance, sit-stands, standing tolerance and dynamic balance during co-treat with PT.  Pt propelled w/c throughout the unit using BUEs with supervision.  Pt performed squat pivot transfer w/c <-> mat in the gym with contact guard/min assist.  Pt did require min verbal cues for safety during transfers.  Education with pt on how to set w/c up for safe transfer.  Pt stood with RW reaching outside BOS for horseshoe task with min assist.    Therapy/Group: Co-Treatment  Elle Vezina 11/28/2018, 12:45 PM

## 2018-11-29 ENCOUNTER — Inpatient Hospital Stay (HOSPITAL_COMMUNITY): Payer: Medicare Other

## 2018-11-29 ENCOUNTER — Inpatient Hospital Stay (HOSPITAL_COMMUNITY): Payer: Medicare Other | Admitting: Occupational Therapy

## 2018-11-29 LAB — BASIC METABOLIC PANEL
Anion gap: 10 (ref 5–15)
BUN: 32 mg/dL — ABNORMAL HIGH (ref 8–23)
CO2: 28 mmol/L (ref 22–32)
Calcium: 9.3 mg/dL (ref 8.9–10.3)
Chloride: 98 mmol/L (ref 98–111)
Creatinine, Ser: 1.7 mg/dL — ABNORMAL HIGH (ref 0.61–1.24)
GFR calc Af Amer: 46 mL/min — ABNORMAL LOW (ref >60)
GFR calc non Af Amer: 40 mL/min — ABNORMAL LOW (ref >60)
Glucose, Bld: 117 mg/dL — ABNORMAL HIGH (ref 70–99)
Potassium: 4.7 mmol/L (ref 3.5–5.1)
Sodium: 136 mmol/L (ref 135–145)

## 2018-11-29 LAB — GLUCOSE, CAPILLARY
Glucose-Capillary: 105 mg/dL — ABNORMAL HIGH (ref 70–99)
Glucose-Capillary: 115 mg/dL — ABNORMAL HIGH (ref 70–99)
Glucose-Capillary: 111 mg/dL — ABNORMAL HIGH (ref 70–99)
Glucose-Capillary: 110 mg/dL — ABNORMAL HIGH (ref 70–99)

## 2018-11-29 NOTE — Progress Notes (Signed)
Physical Therapy Session Note  Patient Details  Name: Joseph [name] MRN: [id number] Date of Birth: [birth year]  Today's Date: 11/29/2018 PT Individual Time: 1015-1100 PT Individual Time Calculation (min): 45 min   Short Term Goals: Week 2:  PT Short Term Goal 1 (Week 2): =LTGs due to ELOS  Skilled Therapeutic Interventions/Progress Updates:     Patient in w/c with recreational therapist in room upon PT arrival. Patient alert and agreeable to PT session. Session focused on activity tolerance, w/c mobility, dynamic sitting & standing balance/tolerance with 1 UE support, UE strenghtening  during co-treat with RT.  Pt performed w/c mobility 150 feet on the unit with Mod I, managed w/c parts prior to transfer with 1 verbal cue for sequence of steps.  Pt performed squat pivot transfers x3 with contact guard assist.  Pt ambulated ~59' with RW and CGA.  Pt sat EOM with 1 lb wrist weights for strength training during basket ball task x15 min.  Transitioned to standing with RW for ball tap and then ball toss/catch activity with contact guard-min assist for balance. Patient able to catch ball with B UE x2 with CGa-min A.  Patient in w/c with recreational therapist in the gym at end of session.   Therapy Documentation Precautions:  Precautions Precautions: Fall Precaution Comments: wear R leg brace at all times Required Braces or Orthoses: Other Brace Other Brace: residual limb splint Restrictions Weight Bearing Restrictions: Yes RLE Weight Bearing: Non weight bearing Pain: Patient reported 4/10 pain in his R residual limb during session. PT provided repositioning and distraction as pain interventions throughout session.    Therapy/Group: Co-Treatment  [name] PT, DPT  11/29/2018, 1:27 PM

## 2018-11-29 NOTE — Progress Notes (Signed)
[place] PHYSICAL MEDICINE & REHABILITATION PROGRESS NOTE  Subjective/Complaints: Patient seen sitting up in bed this morning.  He states he slept well overnight.  He has questions regarding stretching exercises.  ROS: Denies CP, SOB, N/V/D  Objective: Vital Signs: Blood pressure 119/74, pulse 63, temperature 98 F (36.7 C), resp. rate 17, height 6\' 4"  (1.93 m), weight 88.2 kg, SpO2 100 %. No results found. Recent Labs    11/26/18 1058  WBC 10.0  HGB 8.8*  HCT 28.1*  PLT 470*   Recent Labs    11/26/18 1058 11/29/18 0623  NA 136 136  K 4.8 4.7  CL 98 98  CO2 26 28  GLUCOSE 93 117*  BUN 33* 32*  CREATININE 1.61* 1.70*  CALCIUM 9.3 9.3    Physical Exam: BP 119/74 (BP Location: Left Arm)   Pulse 63   Temp 98 F (36.7 C)   Resp 17   Ht 6\' 4"  (1.93 m)   Wt 88.2 kg   SpO2 100%   BMI 23.67 kg/m  Constitutional: No distress . Vital signs reviewed. HENT: Normocephalic.  Atraumatic. Eyes: EOMI.  No discharge. Cardiovascular: No JVD. Respiratory: Normal effort. GI: Non---distended. Musc: Right stump with edema and tenderness tenderness, improving Left knee tenderness improving Neurological: Alert and oriented Motor: Bilateral upper extremities: 5/5 proximal distal, stable Left lower extremity: 4-4+/5 proximal to distal. Right lower extremity: Hip flexion 4+/5 hip flexion Skin: right stump with shrinker C/D/I Psychiatric: Normal mood.  Normal behavior.  Assessment/Plan: 1. Functional deficits secondary to right BKA which require 3+ hours per day of interdisciplinary therapy in a comprehensive inpatient rehab setting.  Physiatrist is providing close team supervision and 24 hour management of active medical problems listed below.  Physiatrist and rehab team continue to assess barriers to discharge/monitor patient progress toward functional and medical goals  Care Tool:  Bathing    Body parts bathed by patient: Face, Left upper leg, Right upper leg, Buttocks,  Front perineal area, Abdomen, Chest, Left arm, Right arm, Right lower leg   Body parts bathed by helper: Left lower leg Body parts n/a: Right lower leg   Bathing assist Assist Level: Supervision/Verbal cueing     Upper Body Dressing/Undressing Upper body dressing   What is the patient wearing?: Pull over shirt    Upper body assist Assist Level: Supervision/Verbal cueing    Lower Body Dressing/Undressing Lower body dressing      What is the patient wearing?: Pants     Lower body assist Assist for lower body dressing: Supervision/Verbal cueing     Toileting Toileting    Toileting assist Assist for toileting: Moderate Assistance - Patient 50 - 74%     Transfers Chair/bed transfer  Transfers assist     Chair/bed transfer assist level: Minimal Assistance - Patient > 75%     Locomotion Ambulation   Ambulation assist   Ambulation activity did not occur: Safety/medical concerns  Assist level: Contact Guard/Touching assist Assistive device: Walker-rolling Max distance: 40'   Walk 10 feet activity   Assist  Walk 10 feet activity did not occur: Safety/medical concerns  Assist level: Contact Guard/Touching assist Assistive device: Walker-rolling   Walk 50 feet activity   Assist Walk 50 feet with 2 turns activity did not occur: Safety/medical concerns         Walk 150 feet activity   Assist Walk 150 feet activity did not occur: Safety/medical concerns         Walk 10 feet on uneven surface  activity   Assist Walk 10 feet on uneven surfaces activity did not occur: Safety/medical concerns         Wheelchair     Assist Will patient use wheelchair at discharge?: Yes Type of Wheelchair: Manual    Wheelchair assist level: Supervision/Verbal cueing Max wheelchair distance: 150    Wheelchair 50 feet with 2 turns activity    Assist        Assist Level: Supervision/Verbal cueing   Wheelchair 150 feet activity     Assist      Assist Level: Supervision/Verbal cueing      Medical Problem List and Plan: 1.Decreased functional mobilitysecondary to 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 Antithrombotics: -DVT/anticoagulation:SCDs left lower extremity  -antiplatelet therapy: n/a 3. Pain Management:Neurontin 300 mg 3 times daily, Robaxin and oxycodone as needed  Voltaren gel for left knee pain, ice  Improved 4. Mood:Provide emotional support -antipsychotic agents: N/A 5. Neuropsych: This patientiscapable of making decisions on hisown behalf. 6. Skin/Wound Care:  Continue shrinker 7. Fluids/Electrolytes/Nutrition:Routine in and out 8.Acute blood loss anemia.   Patient refused any GI work-up on acute floor.  Hemoglobin 8.8 on 6/29, labs ordered for tomorrow  Asymptomatic at present 9. Acute onset atrial fibrillation. Amiodarone 200 mg twice daily. Cardiology follow-up 10. Chronic diastolic congestive heart failure. Lasix 40 mg daily Filed Weights   11/27/18 1943 11/28/18 0500 11/29/18 0615  Weight: 90 kg 89.3 kg 88.2 kg   Improving on 7/2 11. Diabetes mellitus with peripheral neuropathy. Hemoglobin A1c 5.7. Glucotrol 5 mg daily,NovoLog 3 units 3 times daily, Levemir 8 units nightly. Check blood sugars before meals and at bedtime  Controlled on 7/2  Cover with SSI for now before changing 12. AKI on CKD stage II.   Creatinine 1.70 on 7/2, continues to trend up, labs ordered for tomorrow.  Discussed with nursing.  Encourage fluids  Continue to monitor 13. Constipation. Laxative assistance 14.  Hypoalbuminemia  Supplement initiated on 6/24 15.  Leukocytosis: Resolved  WBC 10.0 on 6/29  Afebrile  Continue to monitor  LOS: 9 days A FACE TO FACE EVALUATION WAS PERFORMED  Joseph [name] 11/29/2018, 8:47 AM

## 2018-11-29 NOTE — Progress Notes (Signed)
Recreational Therapy Session Note  Patient Details  Name: [name] Hawks MRN: [id number] Date of Birth: January 07, 1946 Today's Date: 11/29/2018 Time:1015-1115 Pain: 4/10, premedicated Skilled Therapeutic Interventions/Progress Updates: Session focused on activity tolerance, w/c mobility, dynamic sitting & standing balance/tolerance with 1 UE support, UE strenghtening  during co-treat with PT.  Pt performed w/c mobility on the unit with Mod I, managed w/c parts prior to transfer with 1 verbal cue for sequence of steps.  Pt performed squat pivot transfers x2 with contact guard assist.  Pt ambulated ~59' with RW & min assist.  Pt sat EOM with 1 lb wrist weights for strength training during basket ball task.  Transitioned to standing with RW for ball tap and then ball toss/catch activity with contact guard-min assist for balance.  Pt returned to his room with Mod I w/c level.    Therapy/Group: Co-Treatment  Jordyn Doane 11/29/2018, 8:28 AM

## 2018-11-29 NOTE — Patient Care Conference (Signed)
Inpatient RehabilitationTeam Conference and Plan of Care Update Date: 11/28/2018   Time: 2:55 PM    Patient Name: Joseph [name]      Medical Record Number: [id number]  Date of Birth: April 13, 1946 Sex: Male         Room/Bed: 4M05C/4M05C-01 Payor Info: Payor: MEDICARE / Plan: MEDICARE PART A AND B / Product Type: *No Product type* /    Admitting Diagnosis: 6. ABI Team  [place]. Rt. BKA; 15 - 18 days  Admit Date/Time:  11/20/2018  4:46 PM Admission Comments: No comment available   Primary Diagnosis:  <principal problem not specified> Principal Problem: <principal problem not specified>  Patient Active Problem List   Diagnosis Date Noted  . AKI (acute kidney injury) ([place])   . Acute blood loss anemia   . Chronic diastolic congestive heart failure ([place])   . Diabetic peripheral neuropathy ([place])   . CKD (chronic kidney disease), stage II   . Hypoalbuminemia due to protein-calorie malnutrition ([place])   . Leukocytosis   . 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[place]) 11/20/2018  . Cellulitis of right lower extremity   . Atrial fibrillation ([place])   . Gangrene of right foot ([place])   . Diabetic polyneuropathy associated with type 2 diabetes mellitus ([place])   . PVD (peripheral vascular disease) ([place])   . Critical lower limb ischemia   . Sepsis with acute renal failure ([place]) 11/13/2018  . Acute renal failure superimposed on stage 2 chronic kidney disease ([place])   . Open wound of right foot   . 'light-for-dates' infant with signs of fetal malnutrition 10/17/2018  . DM (diabetes mellitus) ([place]) 07/02/2012  . Hypertension associated with diabetes ([place]) 07/02/2012    Expected Discharge Date: Expected Discharge Date: 12/04/18  Team Members Present: Physician leading conference: Dr. Delice Lesch Social Worker Present: Alfonse Alpers, LCSW Nurse Present: Rayetta Humphrey, RN PT Present: Apolinar Junes, PT OT Present: Mariane Masters, OT SLP Present: Stormy Fabian, SLP PPS Coordinator present : Gunnar Fusi, SLP   Current Status/Progress Goal Weekly Team Focus  Medical   Decreased functional mobility secondary to ischemic right lower extremity status post right BKA 11/16/2018  Improve mobility, transfers, ABLA, AKI, DM, CHF  See above   Bowel/Bladder   Con of B/B.  To remain con b/b with timed bathroom visits.      Swallow/Nutrition/ Hydration             ADL's   Supervision/CGA  Supervision/Mod I  functional transfers, dc planning, residual limb care, self-care retraining   Mobility   CGA overall, gait up to 30' w/ RW, occasionally up to min assist sit>stand from lower surfaces  supervision  amputee education, gait, functional transfers and sit<>stands, w/c parts management   Communication             Safety/Cognition/ Behavioral Observations            Pain   Patien has pain at the surgical site but is controled with medication.  To be free from pain.Taking medication as needed to control the pain.      Skin   Rt bka. Staples and ace with shrinker.  To keep dressing clean and dry to allow for healing.       Rehab Goals Patient on target to meet rehab goals: Yes Rehab Goals Revised: none *See Care Plan and progress notes for long and short-term goals.     Barriers to Discharge  Current Status/Progress Possible Resolutions Date Resolved   Physician    Medical stability;Wound Care;Weight  bearing restrictions     See above  Therapies, follow labs, encourage fluids, optimize DM meds, follow weights      Nursing                  PT                    OT                  SLP                SW                Discharge Planning/Teaching Needs:  Pt to return to his home where son built a ramp and family will pull together to be with pt for a couple of weeks.  Family education to be offered closer to d/c, if needed.   Team Discussion:  Pt with BKA, expected blood loss anemia, kidney injury.  Dr. Allena KatzPatel is monitoring blood sugars and weights due to CHF.  Pt is taking pain medications  every 4 hours and is doing well  Pt is S/CGA with ADLs and has S/Mod I goals.  With PT, pt is CGA overall and is ambulating 40' with rolling walker and CGA.  Ramp is completed.  Revisions to Treatment Plan:  none    Continued Need for Acute Rehabilitation Level of Care: The patient requires daily medical management by a physician with specialized training in physical medicine and rehabilitation for the following conditions: Daily direction of a multidisciplinary physical rehabilitation program to ensure safe treatment while eliciting the highest outcome that is of practical value to the patient.: Yes Daily medical management of patient stability for increased activity during participation in an intensive rehabilitation regime.: Yes Daily analysis of laboratory values and/or radiology reports with any subsequent need for medication adjustment of medical intervention for : Diabetes problems;Post surgical problems;Wound care problems;Cardiac problems;Renal problems;Other   I attest that I was present, lead the team conference, and concur with the assessment and plan of the team.Team conference was held via web/ teleconference due to COVID - 19.   Renelda Kilian, Vista DeckJennifer Capps 11/29/2018, 12:29 AM

## 2018-11-29 NOTE — Progress Notes (Signed)
Social Work Patient ID: Joseph [name], male   DOB: 12-02-1945, 73 y.o.   MRN: [id number]   CSW met with pt to update him on team conference discussion and to talk about d/c planning and complete SCAT application.  CSW also gave pt community resources information.  Pt was very appreciative and feels he will be prepared to leave on Tuesday.  CSW later called pt's friend, Lucina Mellow, and left her a message about pt's d/c.  Pt will have [place] and DME and f/u at [facility].  No other needs.  This CSW will be out of the hospital, but co-workers will facilitate pt's d/c and assist as needed.

## 2018-11-29 NOTE — Progress Notes (Signed)
Occupational Therapy Session Note  Patient Details  Name: [name] Lape MRN: [id number] Date of Birth: 1945-06-28  Today's Date: 11/29/2018 OT Individual Time: 9678-9381 OT Individual Time Calculation (min): 46 min    Short Term Goals: Week 2:  OT Short Term Goal 1 (Week 2): LTG=STG 2/2 ELOS  Skilled Therapeutic Interventions/Progress Updates:    Treatment session with focus on ADL retraining with bathing and dressing and functional transfers.  Pt received supine in bed agreeable to therapy session.  Engaged in bed mobility with supervision and transferred to w/c CGA.  Setup for transfer w/c > shower seat with education on proper positioning and setup of w/c prior to transfers.  Bathing completed at overall distant supervision/setup level with pt utilizing lateral leans when washing buttocks.  Completed dressing from w/c level with lateral leans when pulling pants over hips.  Pt able to don TED stocking on LLE with use of plastic foot piece and donned shoe and limb guard from w/c level.  Pt passed off to PT.  Therapy Documentation Precautions:  Precautions Precautions: Fall Precaution Comments: wear R leg brace at all times Required Braces or Orthoses: Other Brace Other Brace: residual limb splint Restrictions Weight Bearing Restrictions: Yes RLE Weight Bearing: Non weight bearing Pain: Pain Assessment Pain Scale: 0-10 Pain Score: 9  Pain Location: Leg Pain Orientation: Right Patients Stated Pain Goal: 6 Pain Intervention(s): Medication (See eMAR)   Therapy/Group: Individual Therapy  Simonne Come 11/29/2018, 11:28 AM

## 2018-11-29 NOTE — Progress Notes (Signed)
0Physical Therapy Session Note  Patient Details  Name: Joseph [name] MRN: [id number] Date of Birth: 1946/03/26  Today's Date: 11/29/2018 PT Individual Time: 1305-1405 PT Individual Time Calculation (min): 60 min   Short Term Goals: Week 2:  PT Short Term Goal 1 (Week 2): =LTGs due to ELOS  Skilled Therapeutic Interventions/Progress Updates:    D/c planning discussed throughout session and overall goals and progress. Pt donned limbguard independently and able to state purpose. Discussed increased fall risk due to impaired balance and change in COG. Education provided on process of prosthetic training and general progression and importance of full wound healing/closure. Also educated on protection of LLE as sound limb and limiting strain/impact on this which pt in agreement and able to verbalize himself as well.   Functional transfers for scoot/squat pivot transfer with overall CGA for balance and reinforcement cueing for correct hand placement. Pt able to manage legrests with extra time to prepare for transfers. Pt engaged in simulated car transfer to truck height as he anticpates going home in a tall vehicle (SUV/Truck) and demonstrated both options from w/c and using RW. Pt elected to attempt from w/c only and required min assist for balance and support during transition. Discussed options for correct hand placement and importance of L foot positioning especially when transferring back out of the car.   Core strengthening exercises to aid with overall mobility and functional dynamic sitting and standing balance including:  PNF diagonals x 10 reps each direction and trunk rotation x 10 reps each direction both with 2kg weighted medicine ball.   Semi reclined crunches x 10 reps.   Pt performed independent w/c mobility on unit and in tight spaces such as room to simulate home environment.   Therapy Documentation Precautions:  Precautions Precautions: Fall Precaution Comments: wear R leg brace at  all times Required Braces or Orthoses: Other Brace Other Brace: residual limb splint Restrictions Weight Bearing Restrictions: Yes RLE Weight Bearing: Non weight bearing  Pain:  8/10 pain in knee. Medication given by RN    Therapy/Group: Individual Therapy  Canary Brim Ivory Broad, PT, DPT, CBIS  11/29/2018, 3:18 PM

## 2018-11-29 NOTE — Progress Notes (Signed)
Recreational Therapy Discharge Summary Patient Details  Name: [name] Masini MRN: [id number] Date of Birth: 1946/04/07 Today's Date: 11/29/2018  Long term goals set: 1  Long term goals met: 1  Comments on progress toward goals: Pt has made good progress during LOS and is scheduled for discharge home on 7/7 with family to provide supervision/assistance.  TR sessions focused on use of leisure time at discharge, completing activity analysis with potential adaptations, energy conservation, and overall strengthening activities.  Pt is discharging at overall Mod I w/c for simple TR tasks.  Pt requires up to [place] assist for standing TR tasks for safety & balance.  Pt appreciative of TR and rehab care.  Reasons for discharge: treatment goals met  Patient/family agrees with progress made and goals achieved: Yes  Antonia Jicha 11/29/2018, 8:28 AM

## 2018-11-30 ENCOUNTER — Inpatient Hospital Stay (HOSPITAL_COMMUNITY): Payer: Medicare Other | Admitting: Occupational Therapy

## 2018-11-30 ENCOUNTER — Inpatient Hospital Stay (HOSPITAL_COMMUNITY): Payer: Medicare Other

## 2018-11-30 LAB — CBC WITH DIFFERENTIAL/PLATELET
Abs Immature Granulocytes: 0.01 10*3/uL (ref 0.00–0.07)
Basophils Absolute: 0.1 10*3/uL (ref 0.0–0.1)
Basophils Relative: 1 %
Eosinophils Absolute: 0.3 10*3/uL (ref 0.0–0.5)
Eosinophils Relative: 3 %
HCT: 25.8 % — ABNORMAL LOW (ref 39.0–52.0)
Hemoglobin: 8.3 g/dL — ABNORMAL LOW (ref 13.0–17.0)
Immature Granulocytes: 0 %
Lymphocytes Relative: 47 %
Lymphs Abs: 3.9 10*3/uL (ref 0.7–4.0)
MCH: 29.3 pg (ref 26.0–34.0)
MCHC: 32.2 g/dL (ref 30.0–36.0)
MCV: 91.2 fL (ref 80.0–100.0)
Monocytes Absolute: 0.5 10*3/uL (ref 0.1–1.0)
Monocytes Relative: 6 %
Neutro Abs: 3.6 10*3/uL (ref 1.7–7.7)
Neutrophils Relative %: 43 %
Platelets: 387 10*3/uL (ref 150–400)
RBC: 2.83 MIL/uL — ABNORMAL LOW (ref 4.22–5.81)
RDW: 13.2 % (ref 11.5–15.5)
WBC: 8.5 10*3/uL (ref 4.0–10.5)
nRBC: 0 % (ref 0.0–0.2)

## 2018-11-30 LAB — BASIC METABOLIC PANEL
Anion gap: 9 (ref 5–15)
BUN: 35 mg/dL — ABNORMAL HIGH (ref 8–23)
CO2: 30 mmol/L (ref 22–32)
Calcium: 9.2 mg/dL (ref 8.9–10.3)
Chloride: 97 mmol/L — ABNORMAL LOW (ref 98–111)
Creatinine, Ser: 1.58 mg/dL — ABNORMAL HIGH (ref 0.61–1.24)
GFR calc Af Amer: 50 mL/min — ABNORMAL LOW (ref >60)
GFR calc non Af Amer: 43 mL/min — ABNORMAL LOW (ref >60)
Glucose, Bld: 103 mg/dL — ABNORMAL HIGH (ref 70–99)
Potassium: 4.8 mmol/L (ref 3.5–5.1)
Sodium: 136 mmol/L (ref 135–145)

## 2018-11-30 LAB — GLUCOSE, CAPILLARY
Glucose-Capillary: 108 mg/dL — ABNORMAL HIGH (ref 70–99)
Glucose-Capillary: 70 mg/dL (ref 70–99)
Glucose-Capillary: 136 mg/dL — ABNORMAL HIGH (ref 70–99)
Glucose-Capillary: 167 mg/dL — ABNORMAL HIGH (ref 70–99)

## 2018-11-30 NOTE — Progress Notes (Signed)
Physical Therapy Session Note  Patient Details  Name: Joseph [name] MRN: [id number] Date of Birth: December 24, 1945  Today's Date: 11/30/2018 PT Individual Time: 0845-1000 PT Individual Time Calculation (min): 75 min   Short Term Goals: Week 2:  PT Short Term Goal 1 (Week 2): =LTGs due to ELOS  Skilled Therapeutic Interventions/Progress Updates:     Patient in bed upon PT arrival. Patient alert and agreeable to PT session. PT asked patient to get ready for the day as he would at home to promote increased independence with his morning routine, during which the patient chose to put on his L TED hose and shoe, get out bed and into the w/c, bush his teeth and wash his face at the sink sitting in the w/c, and then chose to continue with his therapy session in the gym, all with supervision from PT without cues.  Therapeutic Activity: Bed Mobility: Patient performed supine to/from sit without use of bed functions with supervision for safety. Transfers: Patient performed squat pivot transfers x3 with supervision and min cues x1 for sequencing for removing leg rests, locking breaks and moving the arm rest of the w/c prior to transfer (used L-B-A memory tool) and reversing the process when transferring back to the w/c. He performed sit to/from stand x1 with CGA for balance and safety using a RW.   Gait Training:  Patient ambulated 50 feet using RW with CGA for balance and safety. Ambulated with hop-to gait pattern on L LE with controlled initial contact. Provided verbal cues for relaxed shoulders and looking ahead.  Wheelchair Mobility:  Patient propelled wheelchair 150 feet with mod I. Supervision for management of leg rests and breaks, see transfers above.  Therapeutic Exercise: Patient performed the following exercises with verbal and tactile cues for proper technique from HEP PT provided and left in his room. Instructed patient to perform HEP 2x per day with or without therapist and to write down  questions when performing exercises independently. -abdominal crunches on burgundy foam wedge x10 -oblique crunches on burgundy foam wedge with opposite arm reaching x10 -B glut sets with 5 sec hold x10 -R SLR x10 -R hip abduction in side-lying x10 -R hip extension in prone x10 -R knee flexion in prone x10 -Prone lying for 5 min for hip flexor stretch, during PT educated on lying prone 2x per day building up to 10 min for improved hip ROM with bed flat  Patient in w/c at end of session with breaks locked, chair alarm set, and all needs within reach. PT educated about follow-up care and process for receiving a prosthesis during session.    Therapy Documentation Precautions:  Precautions Precautions: Fall Precaution Comments: wear R leg brace at all times Required Braces or Orthoses: Other Brace Other Brace: residual limb splint Restrictions Weight Bearing Restrictions: Yes RLE Weight Bearing: Non weight bearing Pain: Patient denied pain during session.     Therapy/Group: Individual Therapy  Meagan Spease L Christepher Melchior PT, DPT  11/30/2018, 3:32 PM

## 2018-11-30 NOTE — Progress Notes (Signed)
Occupational Therapy Session Note  Patient Details  Name: Joseph [name] MRN: [id number] Date of Birth: Apr 27, 1946  Today's Date: 11/30/2018 OT Individual Time: 1047-1200 OT Individual Time Calculation (min): 73 min   Short Term Goals: Week 2:  OT Short Term Goal 1 (Week 2): LTG=STG 2/2 ELOS  Skilled Therapeutic Interventions/Progress Updates:    Pt greeted seated in wc and agreeable to OT treatment session. PT requests to shower today. Pt able to doff limb guard independently, then OT applied water proof bag over residual limb. Pt completed squat-pivot onto shower seat with CGA and verbal cues to lift bottom higher off of chair. OT issued LH sponge to increase access to LB bathing-Overall supervision for bathing tasks. Pt completed dressing from wc level with lateral leans to pull up pants. Pt able to don R TED hose today using friction reducing BAG and tolerated more L [place] flexion to don shoe today with supervision. Pt propelled wc to tub room and practiced tub bench transfer with supervision. Pt then propelled wc into kitchen and practiced accessing microwave, fridge, and cabinets. Discussed safety within simple meal prep tasks in simulated home environment. Pt returned to room and left seated in wc with chair alarm on and needs met.   Therapy Documentation Precautions:  Precautions Precautions: Fall Precaution Comments: wear R leg brace at all times Required Braces or Orthoses: Other Brace Other Brace: residual limb splint Restrictions Weight Bearing Restrictions: Yes RLE Weight Bearing: Non weight bearing Pain: Denies pain  Therapy/Group: Individual Therapy  Valma Cava 11/30/2018, 11:05 AM

## 2018-11-30 NOTE — Progress Notes (Signed)
Occupational Therapy Session Note  Patient Details  Name: [name] Veloso MRN: [id number] Date of Birth: 28-Jan-1946  Today's Date: 11/30/2018 OT Individual Time: 0600-4599 OT Individual Time Calculation (min): 41 min    Skilled Therapeutic Interventions/Progress Updates: Patient stated he was fatigued but concurred to work on seated upper body strengtheing and endurance to increase strength and endurance for self care and functional standing endurance.   He utilize 3 lb weights seated in w/c for exercises.    Patient left seated in w/c with call bell and phone within reach.  Continue OT plan of care.     Therapy Documentation Precautions:  Precautions Precautions: Fall Precaution Comments: wear R leg brace at all times Required Braces or Orthoses: Other Brace Other Brace: residual limb splint Restrictions Weight Bearing Restrictions: Yes RLE Weight Bearing: Non weight bearing  Pain:denied  Therapy/Group: Individual Therapy  Alfredia Ferguson [facility] 11/30/2018, 3:09 PM

## 2018-11-30 NOTE — Progress Notes (Signed)
  Patient ID: Joseph [name], male   DOB: 09-24-1945, 73 y.o.   MRN: [id number]      Diagnosis codes:  Z89.511;  I50.32  Height:     6'4"           Weight:    206 lbs        Patient suffers from right BKA and CHF which impairs their ability to perform daily activities like bathing, dressing and mobility in the home.  A walker will not resolve issue with performing activities of daily living.  A wheelchair will allow patient to safely perform daily activities.  Patient is not able to propel themselves in the home using a standard weight wheelchair due to general weakness.  Patient can self propel in the lightweight wheelchair.   Reesa Chew, PA-C

## 2018-11-30 NOTE — Progress Notes (Signed)
[place] PHYSICAL MEDICINE & REHABILITATION PROGRESS NOTE  Subjective/Complaints: Patient seen sitting up in bed this morning.  He states he slept well overnight.  He has several questions regarding dressing changes, discharge medications, discharge appointments, etc.  ROS: Denies CP, SOB, N/V/D  Objective: Vital Signs: Blood pressure 103/65, pulse 63, temperature 98.4 F (36.9 C), temperature source Oral, resp. rate 12, height 6\' 4"  (1.93 m), weight 90.1 kg, SpO2 99 %. No results found. Recent Labs    11/30/18 0535  WBC 8.5  HGB 8.3*  HCT 25.8*  PLT 387   Recent Labs    11/29/18 0623 11/30/18 0535  NA 136 136  K 4.7 4.8  CL 98 97*  CO2 28 30  GLUCOSE 117* 103*  BUN 32* 35*  CREATININE 1.70* 1.58*  CALCIUM 9.3 9.2    Physical Exam: BP 103/65 (BP Location: Left Arm)   Pulse 63   Temp 98.4 F (36.9 C) (Oral)   Resp 12   Ht 6\' 4"  (1.93 m)   Wt 90.1 kg   SpO2 99%   BMI 24.17 kg/m  Constitutional: No distress . Vital signs reviewed. HENT: Normocephalic.  Atraumatic. Eyes: EOMI.  No discharge. Cardiovascular: No JVD. Respiratory: Normal effort. GI: Non-distended. Musc: Right stump with edema and tenderness tenderness, improving Left knee tenderness improving Neurological: Alert and oriented Motor:  Left lower extremity: 4-4+/5 proximal to distal, stable. Right lower extremity: Hip flexion 4+/5 hip flexion, stable Skin: right stump with shrinker C/D/I Psychiatric: Normal mood.  Normal behavior.  Assessment/Plan: 1. Functional deficits secondary to right BKA which require 3+ hours per day of interdisciplinary therapy in a comprehensive inpatient rehab setting.  Physiatrist is providing close team supervision and 24 hour management of active medical problems listed below.  Physiatrist and rehab team continue to assess barriers to discharge/monitor patient progress toward functional and medical goals  Care Tool:  Bathing    Body parts bathed by patient:  Face, Left upper leg, Right upper leg, Buttocks, Front perineal area, Abdomen, Chest, Left arm, Right arm, Left lower leg   Body parts bathed by helper: Left lower leg Body parts n/a: Right lower leg   Bathing assist Assist Level: Supervision/Verbal cueing     Upper Body Dressing/Undressing Upper body dressing   What is the patient wearing?: Pull over shirt    Upper body assist Assist Level: Set up assist    Lower Body Dressing/Undressing Lower body dressing      What is the patient wearing?: Pants     Lower body assist Assist for lower body dressing: Supervision/Verbal cueing     Toileting Toileting    Toileting assist Assist for toileting: Moderate Assistance - Patient 50 - 74%     Transfers Chair/bed transfer  Transfers assist     Chair/bed transfer assist level: Contact Guard/Touching assist     Locomotion Ambulation   Ambulation assist   Ambulation activity did not occur: Safety/medical concerns  Assist level: Contact Guard/Touching assist Assistive device: Walker-rolling Max distance: 7059'   Walk 10 feet activity   Assist  Walk 10 feet activity did not occur: Safety/medical concerns  Assist level: Contact Guard/Touching assist Assistive device: Walker-rolling   Walk 50 feet activity   Assist Walk 50 feet with 2 turns activity did not occur: Safety/medical concerns  Assist level: Contact Guard/Touching assist Assistive device: Walker-rolling    Walk 150 feet activity   Assist Walk 150 feet activity did not occur: Safety/medical concerns  Walk 10 feet on uneven surface  activity   Assist Walk 10 feet on uneven surfaces activity did not occur: Safety/medical concerns         Wheelchair     Assist Will patient use wheelchair at discharge?: Yes Type of Wheelchair: Manual    Wheelchair assist level: Independent Max wheelchair distance: 150    Wheelchair 50 feet with 2 turns activity    Assist         Assist Level: Independent   Wheelchair 150 feet activity     Assist     Assist Level: Independent      Medical Problem List and Plan: 1.Decreased functional mobilitysecondary to 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 Antithrombotics: -DVT/anticoagulation:SCDs left lower extremity  -antiplatelet therapy: n/a 3. Pain Management:Neurontin 300 mg 3 times daily, Robaxin and oxycodone as needed  Voltaren gel for left knee pain, ice  Improving 4. Mood:Provide emotional support -antipsychotic agents: N/A 5. Neuropsych: This patientiscapable of making decisions on hisown behalf. 6. Skin/Wound Care:  Continue stump shrinker 7. Fluids/Electrolytes/Nutrition:Routine in and out 8.Acute blood loss anemia.   Patient refused any GI work-up on acute floor.  Hemoglobin 8.3 on 7/3, labs ordered for Monday  Asymptomatic at present 9. Acute onset atrial fibrillation. Amiodarone 200 mg twice daily. Cardiology follow-up 10. Chronic diastolic congestive heart failure. Lasix 40 mg daily Filed Weights   11/29/18 0615 11/30/18 0500 11/30/18 0511  Weight: 88.2 kg 88 kg 90.1 kg   ?  Reliability on 7/3 11. Diabetes mellitus with peripheral neuropathy. Hemoglobin A1c 5.7. Glucotrol 5 mg daily,NovoLog 3 units 3 times daily, Levemir 8 units nightly. Check blood sugars before meals and at bedtime  Controlled on 7/3  Cover with SSI for now before changing 12. AKI on CKD stage II.   Creatinine 1.58 on 7/3, labs ordered for Monday  Encourage fluids  Continue to monitor 13. Constipation. Laxative assistance 14.  Hypoalbuminemia  Supplement initiated on 6/24 15.  Leukocytosis: Resolved  WBC 10.0 on 6/29  Afebrile  Continue to monitor  LOS: 10 days A FACE TO FACE EVALUATION WAS PERFORMED  Joseph [name] [name] 11/30/2018, 9:05 AM

## 2018-12-01 LAB — GLUCOSE, CAPILLARY
Glucose-Capillary: 110 mg/dL — ABNORMAL HIGH (ref 70–99)
Glucose-Capillary: 94 mg/dL (ref 70–99)
Glucose-Capillary: 90 mg/dL (ref 70–99)
Glucose-Capillary: 164 mg/dL — ABNORMAL HIGH (ref 70–99)

## 2018-12-01 NOTE — Progress Notes (Signed)
[employer] PHYSICAL MEDICINE & REHABILITATION PROGRESS NOTE  Subjective/Complaints: Patient seen sitting up in bed this morning.  States he slept well overnight.  He has questions about discharge timing.  ROS: Denies CP, SOB, N/V/D  Objective: Vital Signs: Blood pressure 115/81, pulse 62, temperature 98.2 F (36.8 C), temperature source Oral, resp. rate 17, height 6\' 4"  (1.93 m), weight 87.3 kg, SpO2 99 %. No results found. Recent Labs    11/30/18 0535  WBC 8.5  HGB 8.3*  HCT 25.8*  PLT 387   Recent Labs    11/29/18 0623 11/30/18 0535  NA 136 136  K 4.7 4.8  CL 98 97*  CO2 28 30  GLUCOSE 117* 103*  BUN 32* 35*  CREATININE 1.70* 1.58*  CALCIUM 9.3 9.2    Physical Exam: BP 115/81 (BP Location: Left Arm)   Pulse 62   Temp 98.2 F (36.8 C) (Oral)   Resp 17   Ht 6\' 4"  (1.93 m)   Wt 87.3 kg   SpO2 99%   BMI 23.43 kg/m  Constitutional: No distress . Vital signs reviewed. HENT: Normocephalic.  Atraumatic. Eyes: EOMI.  No discharge. Cardiovascular: No JVD. Respiratory: Normal effort. GI: Non-distended. Musc: Right stump with edema and tenderness tenderness, improving Left knee tenderness improving Neurological: Alert and oriented Motor:  Left lower extremity: 4+/5 proximal to distal. Right lower extremity: Hip flexion 4+/5 hip flexion, unchanged Skin: right stump with shrinker C/D/I Psychiatric: Normal mood.  Normal behavior.  Assessment/Plan: 1. Functional deficits secondary to right BKA which require 3+ hours per day of interdisciplinary therapy in a comprehensive inpatient rehab setting.  Physiatrist is providing close team supervision and 24 hour management of active medical problems listed below.  Physiatrist and rehab team continue to assess barriers to discharge/monitor patient progress toward functional and medical goals  Care Tool:  Bathing    Body parts bathed by patient: Face, Left upper leg, Right upper leg, Buttocks, Front perineal area,  Abdomen, Chest, Left arm, Right arm, Left lower leg   Body parts bathed by helper: Left lower leg Body parts n/a: Right lower leg   Bathing assist Assist Level: Supervision/Verbal cueing Assistive Device Comment: LH sponge   Upper Body Dressing/Undressing Upper body dressing   What is the patient wearing?: Pull over shirt    Upper body assist Assist Level: Independent    Lower Body Dressing/Undressing Lower body dressing      What is the patient wearing?: Pants     Lower body assist Assist for lower body dressing: Supervision/Verbal cueing     Toileting Toileting    Toileting assist Assist for toileting: Supervision/Verbal cueing     Transfers Chair/bed transfer  Transfers assist     Chair/bed transfer assist level: Supervision/Verbal cueing     Locomotion Ambulation   Ambulation assist   Ambulation activity did not occur: Safety/medical concerns  Assist level: Contact Guard/Touching assist Assistive device: Walker-rolling Max distance: 50'   Walk 10 feet activity   Assist  Walk 10 feet activity did not occur: Safety/medical concerns  Assist level: Contact Guard/Touching assist Assistive device: Walker-rolling   Walk 50 feet activity   Assist Walk 50 feet with 2 turns activity did not occur: Safety/medical concerns  Assist level: Contact Guard/Touching assist Assistive device: Walker-rolling    Walk 150 feet activity   Assist Walk 150 feet activity did not occur: Safety/medical concerns         Walk 10 feet on uneven surface  activity   Assist Walk  10 feet on uneven surfaces activity did not occur: Safety/medical concerns         Wheelchair     Assist Will patient use wheelchair at discharge?: Yes Type of Wheelchair: Manual    Wheelchair assist level: Independent Max wheelchair distance: 150'    Wheelchair 50 feet with 2 turns activity    Assist        Assist Level: Independent   Wheelchair 150 feet  activity     Assist     Assist Level: Independent      Medical Problem List and Plan: 1.Decreased functional mobilitysecondary to 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 Antithrombotics: -DVT/anticoagulation:SCDs left lower extremity  -antiplatelet therapy: n/a 3. Pain Management:Neurontin 300 mg 3 times daily, Robaxin and oxycodone as needed  Voltaren gel for left knee pain, ice  Improved 4. Mood:Provide emotional support -antipsychotic agents: N/A 5. Neuropsych: This patientiscapable of making decisions on hisown behalf. 6. Skin/Wound Care:  Continue stump shrinker 7. Fluids/Electrolytes/Nutrition:Routine in and out 8.Acute blood loss anemia.   Patient refused any GI work-up on acute floor.  Hemoglobin 8.3 on 7/3, labs ordered for Monday  Asymptomatic at present 9. Acute onset atrial fibrillation. Amiodarone 200 mg twice daily. Cardiology follow-up 10. Chronic diastolic congestive heart failure. Lasix 40 mg daily Filed Weights   11/30/18 0500 11/30/18 0511 12/01/18 0525  Weight: 88 kg 90.1 kg 87.3 kg   Stable on 7/4 11. Diabetes mellitus with peripheral neuropathy. Hemoglobin A1c 5.7. Glucotrol 5 mg daily,NovoLog 3 units 3 times daily, Levemir 8 units nightly. Check blood sugars before meals and at bedtime  Slightly labile on 7/4  Cover with SSI for now before changing 12. AKI on CKD stage II.   Creatinine 1.58 on 7/3, labs ordered for Monday  Encourage fluids  Continue to monitor 13. Constipation. Laxative assistance 14.  Hypoalbuminemia  Supplement initiated on 6/24 15.  Leukocytosis: Resolved  WBC 10.0 on 6/29  Afebrile  Continue to monitor  LOS: 11 days A FACE TO FACE EVALUATION WAS PERFORMED  Marcy Bogosian Karis JubaAnil Nalaysia Manganiello 12/01/2018, 3:13 PM

## 2018-12-02 ENCOUNTER — Inpatient Hospital Stay (HOSPITAL_COMMUNITY): Payer: Medicare Other

## 2018-12-02 DIAGNOSIS — G8918 Other acute postprocedural pain: Secondary | ICD-10-CM

## 2018-12-02 LAB — GLUCOSE, CAPILLARY
Glucose-Capillary: 151 mg/dL — ABNORMAL HIGH (ref 70–99)
Glucose-Capillary: 70 mg/dL (ref 70–99)
Glucose-Capillary: 126 mg/dL — ABNORMAL HIGH (ref 70–99)
Glucose-Capillary: 115 mg/dL — ABNORMAL HIGH (ref 70–99)

## 2018-12-02 NOTE — Progress Notes (Signed)
Joseph [name]  Subjective/Complaints: Patient seen sitting up in bed this morning.  He states he slept well overnight.  He states he feels 200% better than the time of admission.  ROS: Denies CP, SOB, N/V/D  Objective: Vital Signs: Blood pressure 101/71, pulse 63, temperature 98.4 F (36.9 C), temperature source Oral, resp. rate 18, height 6\' 4"  (1.93 m), weight 89.2 kg, SpO2 99 %. No results found. Recent Labs    11/30/18 0535  WBC 8.5  HGB 8.3*  HCT 25.8*  PLT 387   Recent Labs    11/30/18 0535  NA 136  K 4.8  CL 97*  CO2 30  GLUCOSE 103*  BUN 35*  CREATININE 1.58*  CALCIUM 9.2    Physical Exam: BP 101/71 (BP Location: Left Arm)   Pulse 63   Temp 98.4 F (36.9 C) (Oral)   Resp 18   Ht 6\' 4"  (1.93 m)   Wt 89.2 kg   SpO2 99%   BMI 23.94 kg/m  Constitutional: No distress . Vital signs reviewed. HENT: Normocephalic.  Atraumatic. Eyes: EOMI.  No discharge. Cardiovascular: No JVD. Respiratory: Normal effort. GI: Non-distended. Musc: Right stump with edema and tenderness tenderness, improving Left knee tenderness improving Neurological: Alert and oriented Motor:  Left lower extremity: 4+-5/5 proximal to distal. Right lower extremity: Hip flexion 5/5 hip flexion Skin: right stump with shrinker C/D/I Psychiatric: Normal mood.  Normal behavior.  Assessment/Plan: 1. Functional deficits secondary to right BKA which require 3+ hours per day of interdisciplinary therapy in a comprehensive inpatient rehab setting.  Physiatrist is providing close team supervision and 24 hour management of active medical problems listed below.  Physiatrist and rehab team continue to assess barriers to discharge/monitor patient progress toward functional and medical goals  Care Tool:  Bathing    Body parts bathed by patient: Face, Left upper leg, Right upper leg, Buttocks, Front perineal area, Abdomen, Chest, Left arm, Right arm,  Left lower leg   Body parts bathed by helper: Left lower leg Body parts n/a: Right lower leg   Bathing assist Assist Level: Supervision/Verbal cueing Assistive Device Comment: LH sponge   Upper Body Dressing/Undressing Upper body dressing   What is the patient wearing?: Pull over shirt    Upper body assist Assist Level: Independent    Lower Body Dressing/Undressing Lower body dressing      What is the patient wearing?: Pants     Lower body assist Assist for lower body dressing: Independent with assitive device Assistive Device Comment: sitting in w/c   Toileting Toileting    Toileting assist Assist for toileting: Supervision/Verbal cueing     Transfers Chair/bed transfer  Transfers assist     Chair/bed transfer assist level: Supervision/Verbal cueing     Locomotion Ambulation   Ambulation assist   Ambulation activity did not occur: Safety/medical concerns  Assist level: Contact Guard/Touching assist Assistive device: Walker-rolling Max distance: 50'   Walk 10 feet activity   Assist  Walk 10 feet activity did not occur: Safety/medical concerns  Assist level: Contact Guard/Touching assist Assistive device: Walker-rolling   Walk 50 feet activity   Assist Walk 50 feet with 2 turns activity did not occur: Safety/medical concerns  Assist level: Contact Guard/Touching assist Assistive device: Walker-rolling    Walk 150 feet activity   Assist Walk 150 feet activity did not occur: Safety/medical concerns         Walk 10 feet on uneven surface  activity   Assist  Walk 10 feet on uneven surfaces activity did not occur: Safety/medical concerns         Wheelchair     Assist Will patient use wheelchair at discharge?: Yes Type of Wheelchair: Manual    Wheelchair assist level: Independent Max wheelchair distance: 150'    Wheelchair 50 feet with 2 turns activity    Assist        Assist Level: Independent   Wheelchair 150  feet activity     Assist     Assist Level: Independent      Medical Problem List and Plan: 1.Decreased functional mobilitysecondary to 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 Antithrombotics: -DVT/anticoagulation:SCDs left lower extremity  -antiplatelet therapy: n/a 3. Pain Management:Neurontin 300 mg 3 times daily, Robaxin and oxycodone as needed  Voltaren gel for left knee pain, ice  Improved  Stump pain relatively controlled on 7/5 4. Mood:Provide emotional support -antipsychotic agents: N/A 5. Neuropsych: This patientiscapable of making decisions on hisown behalf. 6. Skin/Wound Care:  Continue stump shrinker 7. Fluids/Electrolytes/Nutrition:Routine in and out 8.Acute blood loss anemia.   Patient refused any GI work-up on acute floor.  Hemoglobin 8.3 on 7/3, labs ordered for tomorrow  Asymptomatic at present 9. Acute onset atrial fibrillation. Amiodarone 200 mg twice daily. Cardiology follow-up 10. Chronic diastolic congestive heart failure. Lasix 40 mg daily Filed Weights   11/30/18 0511 12/01/18 0525 12/02/18 0427  Weight: 90.1 kg 87.3 kg 89.2 kg   Stable on 7/5 11. Diabetes mellitus with peripheral neuropathy. Hemoglobin A1c 5.7. Glucotrol 5 mg daily,NovoLog 3 units 3 times daily, Levemir 8 units nightly. Check blood sugars before meals and at bedtime  Labile on 7/5  Cover with SSI for now before changing 12. AKI on CKD stage II.   Creatinine 1.58 on 7/3, labs ordered for tomorrow  Encourage fluids  Continue to monitor 13. Constipation. Laxative assistance 14.  Hypoalbuminemia  Supplement initiated on 6/24 15.  Leukocytosis: Resolved  WBC 10.0 on 6/29  Afebrile  Continue to monitor  LOS: 12 days A FACE TO FACE EVALUATION WAS PERFORMED  Joseph [name] Joseph [name] 12/02/2018, 11:27 AM

## 2018-12-02 NOTE — Progress Notes (Signed)
Physical Therapy Session Note  Patient Details  Name: [name] Fournet MRN: [id number] Date of Birth: 1945/07/01  Today's Date: 12/02/2018 PT Individual Time: 1138-1203 PT Individual Time Calculation (min): 25 min   Short Term Goals: Week 2:  PT Short Term Goal 1 (Week 2): =LTGs due to ELOS  Skilled Therapeutic Interventions/Progress Updates:    Session focused on d/c planning discussion, education on energy conservation and importance of continued exercise upon d/c, and seated UE strengthening exercises with 5# straight weight including overhead press, chest press, bicep curls, and suitcase lifts x 15-20 reps each x 2 sets. Pt remains very motivated and making plans with sons and daughter for home set-up upon d/c and ways to make things more manageable for him to be most independent.  Therapy Documentation Precautions:  Precautions Precautions: Fall Precaution Comments: wear R leg brace at all times Required Braces or Orthoses: Other Brace Other Brace: residual limb splint Restrictions Weight Bearing Restrictions: Yes RLE Weight Bearing: Non weight bearing    Pain:  using ice on L knee for pain management. Did not rate number, "just sore from doing 90 steps"   Therapy/Group: Individual Therapy  Canary Brim Ivory Broad, PT, DPT, CBIS  12/02/2018, 12:12 PM

## 2018-12-02 NOTE — Progress Notes (Signed)
Occupational Therapy Session Note  Patient Details  Name: Joseph [name] MRN: [id number] Date of Birth: 1946-02-15  Today's Date: 12/02/2018 OT Individual Time: 4967-5916 OT Individual Time Calculation (min): 65 min    Short Term Goals: Week 2:  OT Short Term Goal 1 (Week 2): LTG=STG 2/2 ELOS  Skilled Therapeutic Interventions/Progress Updates:    Pt received supine with no c/o pain ,agreeable to ADL session. Pt completed bed mobility to EOB with mod I. He completed squat pivot transfer to w/c with mod I with good safety awareness and management of equipment. Pt propelled w/c into bathroom, maneuvering around tight spaces with minimal cueing for chair positioning. Pt completed squat pivot from w/c > shower chair in roll in shower with (S). Pt completed all bathing seated on shower chair with distant (S). Pt completed transfer out of shower back to w/c with (S). Pt dressed with mod I from w/c level. Pt used bag to reduce friction when donning ted hose on LLE. Pt propelled w/c to therapy gym with mod I. Pt completed 95 ft of functional mobility with CGA with 1 standing rest break. Discussed return to activity and safely exercising at home. Pt returned to room and was left sitting up with all needs within reach.   Therapy Documentation Precautions:  Precautions Precautions: Fall Precaution Comments: wear R leg brace at all times Required Braces or Orthoses: Other Brace Other Brace: residual limb splint Restrictions Weight Bearing Restrictions: Yes RLE Weight Bearing: Non weight bearing   Therapy/Group: Individual Therapy  Curtis Sites 12/02/2018, 7:18 AM

## 2018-12-03 ENCOUNTER — Inpatient Hospital Stay (HOSPITAL_COMMUNITY): Payer: Medicare Other | Admitting: Physical Therapy

## 2018-12-03 ENCOUNTER — Inpatient Hospital Stay (HOSPITAL_COMMUNITY): Payer: Medicare Other | Admitting: Occupational Therapy

## 2018-12-03 ENCOUNTER — Inpatient Hospital Stay (HOSPITAL_COMMUNITY): Payer: Medicare Other

## 2018-12-03 LAB — CBC WITH DIFFERENTIAL/PLATELET
Abs Immature Granulocytes: 0.02 10*3/uL (ref 0.00–0.07)
Basophils Absolute: 0.1 10*3/uL (ref 0.0–0.1)
Basophils Relative: 1 %
Eosinophils Absolute: 0.3 10*3/uL (ref 0.0–0.5)
Eosinophils Relative: 3 %
HCT: 30.4 % — ABNORMAL LOW (ref 39.0–52.0)
Hemoglobin: 9.6 g/dL — ABNORMAL LOW (ref 13.0–17.0)
Immature Granulocytes: 0 %
Lymphocytes Relative: 36 %
Lymphs Abs: 3.3 10*3/uL (ref 0.7–4.0)
MCH: 29.3 pg (ref 26.0–34.0)
MCHC: 31.6 g/dL (ref 30.0–36.0)
MCV: 92.7 fL (ref 80.0–100.0)
Monocytes Absolute: 0.7 10*3/uL (ref 0.1–1.0)
Monocytes Relative: 8 %
Neutro Abs: 4.9 10*3/uL (ref 1.7–7.7)
Neutrophils Relative %: 52 %
Platelets: 394 10*3/uL (ref 150–400)
RBC: 3.28 MIL/uL — ABNORMAL LOW (ref 4.22–5.81)
RDW: 13.3 % (ref 11.5–15.5)
WBC: 9.2 10*3/uL (ref 4.0–10.5)
nRBC: 0 % (ref 0.0–0.2)

## 2018-12-03 LAB — BASIC METABOLIC PANEL
Anion gap: 10 (ref 5–15)
BUN: 36 mg/dL — ABNORMAL HIGH (ref 8–23)
CO2: 28 mmol/L (ref 22–32)
Calcium: 9.5 mg/dL (ref 8.9–10.3)
Chloride: 97 mmol/L — ABNORMAL LOW (ref 98–111)
Creatinine, Ser: 1.73 mg/dL — ABNORMAL HIGH (ref 0.61–1.24)
GFR calc Af Amer: 45 mL/min — ABNORMAL LOW (ref >60)
GFR calc non Af Amer: 39 mL/min — ABNORMAL LOW (ref >60)
Glucose, Bld: 123 mg/dL — ABNORMAL HIGH (ref 70–99)
Potassium: 4.3 mmol/L (ref 3.5–5.1)
Sodium: 135 mmol/L (ref 135–145)

## 2018-12-03 LAB — GLUCOSE, CAPILLARY
Glucose-Capillary: 97 mg/dL (ref 70–99)
Glucose-Capillary: 117 mg/dL — ABNORMAL HIGH (ref 70–99)
Glucose-Capillary: 110 mg/dL — ABNORMAL HIGH (ref 70–99)
Glucose-Capillary: 98 mg/dL (ref 70–99)

## 2018-12-03 MED ORDER — PANTOPRAZOLE SODIUM 40 MG PO TBEC: 40.0000 mg | DELAYED_RELEASE_TABLET | Freq: Two times a day (BID) | ORAL | 1 refills | Status: DC

## 2018-12-03 MED ORDER — GABAPENTIN 300 MG PO CAPS: 300.0000 mg | ORAL_CAPSULE | Freq: Three times a day (TID) | ORAL | 1 refills | Status: DC

## 2018-12-03 MED ORDER — GLIPIZIDE 5 MG PO TABS: 5.0000 mg | ORAL_TABLET | Freq: Every day | ORAL | 0 refills | Status: DC

## 2018-12-03 MED ORDER — FUROSEMIDE 40 MG PO TABS: 40.0000 mg | ORAL_TABLET | Freq: Every day | ORAL | 1 refills | Status: DC

## 2018-12-03 MED ORDER — OXYCODONE HCL 5 MG PO TABS: 5.0000 mg | ORAL_TABLET | ORAL | 0 refills | Status: DC | PRN

## 2018-12-03 MED ORDER — DICLOFENAC SODIUM 1 % TD GEL: 2.0000 g | Freq: Three times a day (TID) | TRANSDERMAL | 1 refills | Status: DC

## 2018-12-03 MED ORDER — METHOCARBAMOL 500 MG PO TABS: 500.0000 mg | ORAL_TABLET | Freq: Four times a day (QID) | ORAL | 0 refills | Status: DC | PRN

## 2018-12-03 MED ORDER — AMIODARONE HCL 200 MG PO TABS: 200.0000 mg | ORAL_TABLET | Freq: Every day | ORAL | 0 refills | Status: DC

## 2018-12-03 MED ORDER — POTASSIUM CHLORIDE CRYS ER 20 MEQ PO TBCR: 20.0000 meq | EXTENDED_RELEASE_TABLET | Freq: Every day | ORAL | 1 refills | Status: DC

## 2018-12-03 MED FILL — DICLOFENAC SODIUM 1 % GEL: 1 | 15 days supply | Qty: 100 | Fill #0

## 2018-12-03 MED FILL — FUROSEMIDE 40 MG TAB: 40 | 30 days supply | Qty: 30 | Fill #0

## 2018-12-03 MED FILL — glipiZIDE 5 MG TABS: 5 | 30 days supply | Qty: 30 | Fill #0

## 2018-12-03 MED FILL — AMIODARONE HCL 200 MG TAB: 200 | 30 days supply | Qty: 30 | Fill #0

## 2018-12-03 MED FILL — METHOCARBAMOL 500 MG TABS: 500 | 20 days supply | Qty: 60 | Fill #0

## 2018-12-03 MED FILL — POTASSIUM CL ER 20 MEQ TAB: 20 | 30 days supply | Qty: 30 | Fill #0

## 2018-12-03 MED FILL — PANTOPRAZOLE SOD DR 40 MG T: 40 | 30 days supply | Qty: 60 | Fill #0

## 2018-12-03 MED FILL — GABAPENTIN 300 MG CAPSULE: 300 | 30 days supply | Qty: 90 | Fill #0

## 2018-12-03 NOTE — Progress Notes (Signed)
Physical Therapy Session Note  Patient Details  Name: Joseph [name] MRN: [id number] Date of Birth: [birth year]  Today's Date: 12/03/2018 PT Individual Time: 1300-1330 PT Individual Time Calculation ([name]): 30 [name]   Short Term Goals: Week 2:  PT Short Term Goal 1 (Week 2): =LTGs due to ELOS  Skilled Therapeutic Interventions/Progress Updates:    Modified independent for w/c mobility on unit and management of legrests for set-up for transfers. Pt performed squat pivot transfers w/c <> mat table with distant supervision. Dynamic standing balance retraining with 1 UE support during functional reaching task to both sides and using R and L UE to perform with overall CGA/supervision. Seated core strengthening exercises to aid with overall mobility and balance with 2.2 kg weighted medicine ball for PNF diagonals to each direction x 15 reps and trunk rotation both directions x 15 reps each. Pt in semi reclined position on wedge, instructed in modified crunch with arms outstretched holding 2.2 kg weighted medicine ball x 2 sets of 10 reps each.  Therapy Documentation Precautions:  Precautions Precautions: None Precaution Comments: wear R leg brace at all times Required Braces or Orthoses: Other Brace Other Brace: residual limb splint Restrictions Weight Bearing Restrictions: Yes RLE Weight Bearing: Non weight bearing Other Position/Activity Restrictions: R BKA    Pain: Ice pack on L knee. Reports soreness. Declines need for pain medication to RN.   Therapy/Group: Individual Therapy  Canary Brim Ivory Broad, PT, DPT, CBIS  12/03/2018, 1:46 PM

## 2018-12-03 NOTE — Plan of Care (Deleted)
  Problem: Consults Goal: Diabetes Guidelines if Diabetic/Glucose > 140 Description: If diabetic or lab glucose is > 140 mg/dl - Initiate Diabetes/Hyperglycemia Guidelines & Document Interventions  Outcome: Progressing   Problem: RH SKIN INTEGRITY Goal: RH STG SKIN FREE OF INFECTION/BREAKDOWN Description: Assess incision site every shift. Dressing change per order. Outcome: Progressing   Problem: RH PAIN MANAGEMENT Goal: RH STG PAIN MANAGED AT OR BELOW PT'S PAIN GOAL Description: Pain level less than 5 on scale 0-10 Outcome: Not Progressing   Problem: RH KNOWLEDGE DEFICIT GENERAL Goal: RH STG INCREASE KNOWLEDGE OF SELF CARE AFTER HOSPITALIZATION Description: Pt will be able to demonstrate dressing change to stump site.  Outcome: Progressing

## 2018-12-03 NOTE — Progress Notes (Signed)
Physical Therapy Discharge Summary  Patient Details  Name: Joseph [name] MRN: [id number] Date of Birth: 1945/07/24  Today's Date: 12/03/2018 PT Individual Time: 1030-1125 PT Individual Time Calculation (min): 55 min   Pt in w/c and agreeable to therapy, pain as detailed below. Pt self-propelled w/c around unit, mod/i, using BUEs and performing w/c parts management independently. Additionally negotiated ramp w/ w/c, mod/i. Performed car transfer w/ supervision at both sedan and SUV height as pt unsure which one his daughter will be using. Practiced furniture transfer to/from couch w/ supervision as well. Ambulated 25' w/ RW and supervision, limited by chronic L knee pain in standing, resolves once returning to seated. Discussed needing to limit clutter in the home for safety w/ w/c and RW negotiation. Discussed safe household mobility including performing meal prep and carrying items at w/c level. Pt stated he already plans to have his daughter set-up his kitchen to make all items accessible from w/c level. Pt w/ good safety awareness and able to anticipate how challenging dynamic standing balance tasks would be unsafe such as reaching outside of BOS or taking both hands off of RW. Returned to room via w/c. Reviewed written HEP and adjusted prone stretch to include supine stretch if he is unable to tolerate prone. Ended session in w/c, all needs in reach. Ice applied to R knee.   Patient has met 8 of 8 long term goals due to improved activity tolerance, improved balance, increased strength, increased range of motion, decreased pain, ability to compensate for deficits and functional use of  right upper extremity, left upper extremity and left lower extremity.  Patient to discharge at a wheelchair level Modified Independent, supervision for short distance gait w/ RW.  Patient's care partner is independent to provide the necessary physical assistance at discharge. Pt is able to direct his care appropriately and  has sufficient safety awareness.   Reasons goals not met: n/a  Recommendation:  Patient will benefit from ongoing skilled PT services in home health setting to continue to advance safe functional mobility, address ongoing impairments in functional strength, RLE ROM and flexibility, functional balance, and endurance, and minimize fall risk.  Equipment: w/c and RW  Reasons for discharge: treatment goals met and discharge from hospital  Patient/family agrees with progress made and goals achieved: Yes  PT Discharge Precautions/Restrictions Precautions Precautions: None Restrictions Weight Bearing Restrictions: Yes RLE Weight Bearing: Non weight bearing Other Position/Activity Restrictions: R BKA Pain Pain Assessment Pain Scale: 0-10 Pain Score: 9  Pain Location: Knee Pain Orientation: Left Pain Descriptors / Indicators: Aching;Discomfort Pain Intervention(s): Medication (See eMAR);Emotional support Vision/Perception  Perception Perception: Within Functional Limits Praxis Praxis: Intact  Cognition Overall Cognitive Status: Within Functional Limits for tasks assessed Arousal/Alertness: Awake/alert Orientation Level: Oriented X4 Focused Attention: Appears intact Sustained Attention: Appears intact Memory: Appears intact Awareness: Appears intact Problem Solving: Appears intact Safety/Judgment: Appears intact Sensation Sensation Light Touch: Appears Intact Proprioception: Appears Intact Coordination Gross Motor Movements are Fluid and Coordinated: No Coordination and Movement Description: Impaired 2/2 new R BKA Motor  Motor Motor: Within Functional Limits  Mobility Bed Mobility Bed Mobility: Rolling Right;Rolling Left;Sit to Supine;Supine to Sit Rolling Right: Independent Rolling Left: Independent Supine to Sit: Independent Sit to Supine: Independent Transfers Transfers: Sit to Stand;Stand to Sit;Stand Pivot Transfers Sit to Stand: Supervision/Verbal  cueing Stand to Sit: Supervision/Verbal cueing Stand Pivot Transfers: Supervision/Verbal cueing Locomotion  Gait Ambulation: Yes Gait Assistance: Supervision/Verbal cueing Gait Distance (Feet): 25 Feet Assistive device: Rolling walker Gait Gait: Yes Gait  Pattern: Impaired(impaired 2/2 R BKA) Stairs / Additional Locomotion Stairs: No Ramp: Independent with assistive device(w/c) Product manager Mobility: Yes Wheelchair Assistance: Independent with Camera operator: Both upper extremities Wheelchair Parts Management: Independent Distance: 150'  Trunk/Postural Assessment  Cervical Assessment Cervical Assessment: Within Functional Limits Thoracic Assessment Thoracic Assessment: Within Functional Limits Lumbar Assessment Lumbar Assessment: Within Functional Limits Postural Control Postural Control: Within Functional Limits  Balance Balance Balance Assessed: Yes Static Sitting Balance Static Sitting - Level of Assistance: 7: Independent Dynamic Sitting Balance Dynamic Sitting - Level of Assistance: 7: Independent Static Standing Balance Static Standing - Level of Assistance: 6: Modified independent (Device/Increase time) Dynamic Standing Balance Dynamic Standing - Level of Assistance: 5: Stand by assistance Extremity Assessment  RLE Assessment RLE Assessment: Exceptions to WFL(R BKA) Passive Range of Motion (PROM) Comments: 0-100 deg knee flexion, limited by pain at end range of flexion General Strength Comments: Globally 5/5, no pain w/ resistance LLE Assessment LLE Assessment: Within Functional Limits    Jeriann Sayres K Quavis Klutz 12/03/2018, 12:15 PM

## 2018-12-03 NOTE — Discharge Instructions (Signed)
Inpatient Rehab Discharge Instructions  [name] Schiavo Discharge date and time: No discharge date for patient encounter.   Activities/Precautions/ Functional Status: Activity: activity as tolerated Diet: diabetic diet Wound Care: keep wound clean and dry Functional status:  ___ No restrictions     ___ Walk up steps independently ___ 24/7 supervision/assistance   ___ Walk up steps with assistance ___ Intermittent supervision/assistance  ___ Bathe/dress independently ___ Walk with walker     _x__ Bathe/dress with assistance ___ Walk Independently    ___ Shower independently ___ Walk with assistance    ___ Shower with assistance ___ No alcohol     ___ Return to work/school ________    COMMUNITY REFERRALS UPON DISCHARGE:    Home Health:   PT     OT    RN                         Agency:  [facility]      Phone: [phone]   Medical Equipment/Items Ordered:  Wheelchair, cushion, rolling walker, commode and tub bench                                                      Agency/Supplier:  [employer] @ [phone]   GENERAL COMMUNITY RESOURCES FOR PATIENT/FAMILY:  Support Groups:  Amputee Support Group  (see flyer)      Special Instructions: No driving smoking or alcohol   My questions have been answered and I understand these instructions. I will adhere to these goals and the provided educational materials after my discharge from the hospital.  Patient/Caregiver Signature _______________________________ Date __________  Clinician Signature _______________________________________ Date __________  Please bring this form and your medication list with you to all your follow-up doctor's appointments.

## 2018-12-03 NOTE — Progress Notes (Signed)
Occupational Therapy Discharge Summary  Patient Details  Name: Joseph [name] MRN: [id number] Date of Birth: 1946/04/11  Today's Date: 12/03/2018 OT Individual Time: 0900-1000 OT Individual Time Calculation (min): 60 min   Patient alert and ready for therapy session today.  He is able to direct care.  Reviewed discharge plan and completed adl/shower as documented below.  He demonstrates good safety and states that he is ready for discharge to home.  Patient has met 9 of 9 long term goals due to improved activity tolerance, improved balance and ability to compensate for deficits.  Patient to discharge at overall Modified Independent level.    Reasons goals not met: na  Recommendation:  Patient will benefit from ongoing skilled OT services in home health setting to continue to advance functional skills in the area of BADL.  Equipment: tub bench and 3 in 1 commode  Reasons for discharge: treatment goals met  Patient/family agrees with progress made and goals achieved: Yes  OT Discharge Precautions/Restrictions  Precautions Precautions: None Other Brace: residual limb splint Restrictions Weight Bearing Restrictions: Yes RLE Weight Bearing: Non weight bearing Other Position/Activity Restrictions: R BKA General   Vital Signs   Pain Pain Assessment Pain Scale: 0-10 Pain Score: 2  Pain Type: Phantom pain Pain Location: Leg Pain Orientation: Right Pain Intervention(s): Repositioned ADL ADL Equipment Provided: Long-handled sponge Eating: Independent Where Assessed-Eating: Edge of bed Grooming: Independent Where Assessed-Grooming: Wheelchair Upper Body Bathing: Modified independent Where Assessed-Upper Body Bathing: Shower Lower Body Bathing: Setup Where Assessed-Lower Body Bathing: Shower Upper Body Dressing: Independent Where Assessed-Upper Body Dressing: Bed level Lower Body Dressing: Modified independent Where Assessed-Lower Body Dressing: Wheelchair Toileting:  Modified independent Where Assessed-Toileting: Bedside Commode Toilet Transfer: Modified independent Armed forces technical officer Method: Magazine features editor: Close supervision Social research officer, government Method: Management consultant: Gaffer Baseline Vision/History: No visual deficits Patient Visual Report: No change from baseline Perception  Perception: Within Functional Limits Praxis Praxis: Intact Cognition Overall Cognitive Status: Within Functional Limits for tasks assessed Arousal/Alertness: Awake/alert Orientation Level: Oriented X4 Focused Attention: Appears intact Sustained Attention: Appears intact Memory: Appears intact Awareness: Appears intact Problem Solving: Appears intact Safety/Judgment: Appears intact Sensation Sensation Light Touch: Appears Intact Proprioception: Appears Intact Coordination Gross Motor Movements are Fluid and Coordinated: No Coordination and Movement Description: Impaired 2/2 new R BKA Motor  Motor Motor: Within Functional Limits Mobility  Bed Mobility Bed Mobility: Rolling Right;Rolling Left;Sit to Supine;Supine to Sit Rolling Right: Independent Rolling Left: Independent Supine to Sit: Independent Sit to Supine: Independent Transfers Sit to Stand: Supervision/Verbal cueing Stand to Sit: Supervision/Verbal cueing  Trunk/Postural Assessment  Cervical Assessment Cervical Assessment: Within Functional Limits Thoracic Assessment Thoracic Assessment: Within Functional Limits Lumbar Assessment Lumbar Assessment: Within Functional Limits Postural Control Postural Control: Within Functional Limits  Balance Balance Balance Assessed: Yes Static Sitting Balance Static Sitting - Level of Assistance: 7: Independent Dynamic Sitting Balance Dynamic Sitting - Level of Assistance: 7: Independent Static Standing Balance Static Standing - Level of Assistance: 6: Modified independent (Device/Increase  time) Dynamic Standing Balance Dynamic Standing - Level of Assistance: 5: Stand by assistance Extremity/Trunk Assessment RUE Assessment RUE Assessment: Within Functional Limits LUE Assessment LUE Assessment: Within Functional Limits   Erline Levine A Terren Haberle 12/03/2018, 1:26 PM

## 2018-12-03 NOTE — Progress Notes (Signed)
[facility] PHYSICAL MEDICINE & REHABILITATION PROGRESS NOTE  Subjective/Complaints: Patient seen sitting up in bed this morning.  He states he slept well overnight.  He is questions again regarding discharge medications and follow-up appointments.  Discussed dressing with nursing.  ROS: Denies CP, SOB, N/V/D  Objective: Vital Signs: Blood pressure 109/73, pulse 65, temperature 98.6 F (37 C), temperature source Oral, resp. rate 16, height 6\' 4"  (1.93 m), weight 84.4 kg, SpO2 96 %. No results found. No results for input(s): WBC, HGB, HCT, PLT in the last 72 hours. No results for input(s): NA, K, CL, CO2, GLUCOSE, BUN, CREATININE, CALCIUM in the last 72 hours.  Physical Exam: BP 109/73 (BP Location: Left Arm)   Pulse 65   Temp 98.6 F (37 C) (Oral)   Resp 16   Ht 6\' 4"  (1.93 m)   Wt 84.4 kg   SpO2 96%   BMI 22.65 kg/m  Constitutional: No distress . Vital signs reviewed. HENT: Normocephalic.  Atraumatic. Eyes: EOMI.  No discharge. Cardiovascular: No JVD. Respiratory: Normal effort. GI: Non-distended. Musc: Right stump with edema and tenderness tenderness, improving Left knee tenderness improving Neurological: Alert and oriented Motor:  Left lower extremity: 4+-5/5 proximal to distal, stable. Right lower extremity: Hip flexion 5/5 hip flexion, stable Skin: right stump with staples C/D/I Psychiatric: Normal mood.  Normal behavior.  Assessment/Plan: 1. Functional deficits secondary to right BKA which require 3+ hours per day of interdisciplinary therapy in a comprehensive inpatient rehab setting.  Physiatrist is providing close team supervision and 24 hour management of active medical problems listed below.  Physiatrist and rehab team continue to assess barriers to discharge/monitor patient progress toward functional and medical goals  Care Tool:  Bathing    Body parts bathed by patient: Face, Left upper leg, Right upper leg, Buttocks, Front perineal area, Abdomen,  Chest, Left arm, Right arm, Left lower leg   Body parts bathed by helper: Left lower leg Body parts n/a: Right lower leg   Bathing assist Assist Level: Supervision/Verbal cueing Assistive Device Comment: LH sponge   Upper Body Dressing/Undressing Upper body dressing   What is the patient wearing?: Pull over shirt    Upper body assist Assist Level: Independent    Lower Body Dressing/Undressing Lower body dressing      What is the patient wearing?: Pants     Lower body assist Assist for lower body dressing: Independent with assitive device Assistive Device Comment: sitting in w/c   Toileting Toileting    Toileting assist Assist for toileting: Supervision/Verbal cueing     Transfers Chair/bed transfer  Transfers assist     Chair/bed transfer assist level: Supervision/Verbal cueing     Locomotion Ambulation   Ambulation assist   Ambulation activity did not occur: Safety/medical concerns  Assist level: Contact Guard/Touching assist Assistive device: Walker-rolling Max distance: 50'   Walk 10 feet activity   Assist  Walk 10 feet activity did not occur: Safety/medical concerns  Assist level: Contact Guard/Touching assist Assistive device: Walker-rolling   Walk 50 feet activity   Assist Walk 50 feet with 2 turns activity did not occur: Safety/medical concerns  Assist level: Contact Guard/Touching assist Assistive device: Walker-rolling    Walk 150 feet activity   Assist Walk 150 feet activity did not occur: Safety/medical concerns         Walk 10 feet on uneven surface  activity   Assist Walk 10 feet on uneven surfaces activity did not occur: Safety/medical concerns  Wheelchair     Assist Will patient use wheelchair at discharge?: Yes Type of Wheelchair: Manual    Wheelchair assist level: Independent Max wheelchair distance: 150'    Wheelchair 50 feet with 2 turns activity    Assist        Assist Level:  Independent   Wheelchair 150 feet activity     Assist     Assist Level: Independent      Medical Problem List and Plan: 1.Decreased functional mobilitysecondary to ischemic right lower extremity status post right BKA 11/16/2018  Continue CIR  Plan for d/c tomorrow  Will see patient for transitional care management in 1-2 weeks post-discharge 2. Antithrombotics: -DVT/anticoagulation:SCDs left lower extremity  -antiplatelet therapy: n/a 3. Pain Management:Neurontin 300 mg 3 times daily, Robaxin and oxycodone as needed  Voltaren gel for left knee pain, ice  Improved  Stump pain relatively controlled on 7/6 4. Mood:Provide emotional support -antipsychotic agents: N/A 5. Neuropsych: This patientiscapable of making decisions on hisown behalf. 6. Skin/Wound Care:  Continue stump shrinker 7. Fluids/Electrolytes/Nutrition:Routine in and out 8.Acute blood loss anemia.   Patient refused any GI work-up on acute floor.  Hemoglobin 8.3 on 7/3, labs pending  Asymptomatic at present 9. Acute onset atrial fibrillation. Amiodarone 200 mg twice daily. Cardiology follow-up 10. Chronic diastolic congestive heart failure. Lasix 40 mg daily Filed Weights   12/01/18 0525 12/02/18 0427 12/03/18 0603  Weight: 87.3 kg 89.2 kg 84.4 kg   ?  Reliability on 7/6 11. Diabetes mellitus with peripheral neuropathy. Hemoglobin A1c 5.7. Glucotrol 5 mg daily,NovoLog 3 units 3 times daily, Levemir 8 units nightly. Check blood sugars before meals and at bedtime  Relatively controlled on 7/6  Cover with SSI for now before changing 12. AKI on CKD stage II.   Creatinine 1.58 on 7/3, labs pending  Encourage fluids  Continue to monitor 13. Constipation. Laxative assistance 14.  Hypoalbuminemia  Supplement initiated on 6/24 15.  Leukocytosis: Resolved  WBC 10.0 on 6/29  Afebrile  Continue to monitor  LOS: 13 days A FACE TO FACE EVALUATION WAS  PERFORMED  Feliza Diven Karis Jubanil Keeana Pieratt 12/03/2018, 9:32 AM

## 2018-12-03 NOTE — Discharge Summary (Addendum)
Physician Discharge Summary  Patient ID: Joseph [name] MRN: [id number] DOB/AGE: 71-Sep-1947 71 y.o.  Admit date: 11/20/2018 Discharge date: 12/04/2018  Discharge Diagnoses:  Active Problems:   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HCC)   Acute blood loss anemia   Chronic diastolic congestive heart failure (HCC)   Diabetic peripheral neuropathy (HCC)   CKD (chronic kidney disease), stage II   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)   Leukocytosis   AKI (acute kidney injury) (HCC)   Postoperative pain Constipation  Discharged Condition: Stable  Significant Diagnostic Studies: Dg Chest 1 View  Result Date: 11/13/2018 CLINICAL DATA:  Tachycardia. EXAM: CHEST  1 VIEW COMPARISON:  None. FINDINGS: The heart size and mediastinal contours are within normal limits. Both lungs are clear. The visualized skeletal structures are unremarkable. IMPRESSION: No active disease. Electronically Signed   By: Obie DredgeWilliam T Derry M.D.   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 Subsequent encounter. EXAM: MRI OF THE RIGHT FOREFOOT WITHOUT CONTRAST TECHNIQUE: Multiplanar, multisequence MR imaging of the right forefoot was performed. No intravenous contrast was administered. COMPARISON:  Plain films right foot 09/12/2018, 09/20/2018 and 11/13/2018. FINDINGS: Bones/Joint/Cartilage There is edema in the distal 4 cm of the fifth metatarsal and throughout the fifth toe consistent with osteomyelitis. Gas is seen in the fifth metatarsal head. Bone marrow signal is otherwise unremarkable. Ligaments Intact. Muscles and Tendons Intact.  No intramuscular fluid collection. Soft tissues A large skin ulceration over the distal fifth metatarsal and proximal fifth toe is identified. Bone appears exposed. No abscess. IMPRESSION:  Large skin ulceration along the lateral aspect of the distal fifth metatarsal proximal fifth toe with findings consistent with osteomyelitis in the distal 4 cm of the fifth metatarsal and throughout the fifth toe. 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 Negative for abscess or septic joint. Electronically Signed   By: Drusilla Kannerhomas  Dalessio M.D.   On: 11/14/2018 12:29   Dg Foot Complete Right  Result Date: 11/13/2018 CLINICAL DATA:  Infection. Foot pain. EXAM: RIGHT FOOT COMPLETE - 3+ VIEW COMPARISON:  Radiograph 09/20/2018 FINDINGS: Soft tissue ulcer about the lateral aspect of the fifth metatarsal and metatarsal phalangeal joint with soft tissue air. No associated periosteal reaction or bony destruction. There is sclerosis about the adjacent sesamoid. Hammertoe deformity of the digits limits assessment. No acute fracture. There are vascular calcifications. IMPRESSION: Soft tissue ulcer about the fifth metatarsophalangeal joint with soft tissue air. No radiographic findings of osteomyelitis. Electronically Signed   By: Narda RutherfordMelanie  Sanford M.D.   On: 11/13/2018 19:48   Vas [place] Vanice Sarahbi With/wo Tbi  Result Date: 11/15/2018 LOWER EXTREMITY DOPPLER STUDY Indications: Gangrene, and Dry gangrene right foot. Diminishsed pulses              bilaterally. High Risk Factors: Diabetes.  Comparison Study: No comparison study available Performing Technologist: Milta DeitersSlaughter, [place] [facility]  Examination Guidelines: A complete evaluation includes at minimum, Doppler waveform signals and systolic blood pressure reading at the level of bilateral brachial, anterior tibial, and posterior tibial arteries, when vessel segments are accessible. Bilateral testing is considered an integral part of a complete examination. Photoelectric Plethysmograph (PPG) waveforms and toe systolic pressure readings are included as required and additional duplex testing as needed. Limited examinations for reoccurring  indications may be performed as noted.  ABI  Findings: +---------+------------------+-----+-------------------+-----------------------+ Right    Rt Pressure (mmHg)IndexWaveform           Comment                 +---------+------------------+-----+-------------------+-----------------------+ Brachial 142                    triphasic                                  +---------+------------------+-----+-------------------+-----------------------+ PTA      116               0.82 dampened monophasic                        +---------+------------------+-----+-------------------+-----------------------+ DP       245               1.73 dampened monophasic                        +---------+------------------+-----+-------------------+-----------------------+ Great Toe                                          Unable to fully                                                            evaluate due to the                                                        size of the toe and the                                                    cuff                    +---------+------------------+-----+-------------------+-----------------------+ +---------+------------------+-----+-------------------+-------+ Left     Lt Pressure (mmHg)IndexWaveform           Comment +---------+------------------+-----+-------------------+-------+ Brachial 140                    triphasic                  +---------+------------------+-----+-------------------+-------+ PTA      130               0.92 dampened monophasic        +---------+------------------+-----+-------------------+-------+ DP       151               1.06 dampened monophasic        +---------+------------------+-----+-------------------+-------+ Great Toe29                0.20                            +---------+------------------+-----+-------------------+-------+  +-------+-----------+----------------+------------+------------+  ABI/TBIToday's ABIToday's TBI     Previous ABIPrevious TBI +-------+-----------+----------------+------------+------------+ Right  1.73       Unable to obtain                         +-------+-----------+----------------+------------+------------+ Left   1.06       0.20                                     +-------+-----------+----------------+------------+------------+  Summary: Right - ABIs indicate non compressible arteries probably secondary to calcification. TBIs are unreliable - see comments in the chart listed above. Left - ABIs apperar normal however. ABIs are unreliable. Doppler waveforms and signals do not support the ABI value possible due to a falsely elevation of pressures. TBI is abnormal  *See table(s) above for measurements and observations.  Electronically signed by Lemar Livings MD on 11/15/2018 at 4:09:15 PM.    Final     Labs:  Basic Metabolic Panel: Recent Labs  Lab 11/29/18 0623 11/30/18 0535 12/03/18 1021  NA 136 136 135  K 4.7 4.8 4.3  CL 98 97* 97*  CO2 GLUCOSE 117* 103* 123*  BUN 32* 35* 36*  CREATININE 1.70* 1.58* 1.73*  CALCIUM 9.3 9.2 9.5    CBC: Recent Labs  Lab 11/30/18 0535 12/03/18 1021  WBC 8.5 9.2  NEUTROABS 3.6 4.9  HGB 8.3* 9.6*  HCT 25.8* 30.4*  MCV 91.2 92.7  PLT 387 394    CBG: Recent Labs  Lab 12/02/18 2120 12/03/18 0627 12/03/18 1137 12/03/18 1631 12/03/18 2127  GLUCAP 115* 97 117* 110* 98   Family history.  Father with diabetes.  Denies any hypertension hyperlipidemia or colon cancer  Brief HPI:   Joseph [name] is a 73 year old right-handed male with history of diabetes mellitus, diastolic congestive heart failure, CKD stage II.  Lives alone independent prior to admission.  Presented 11/13/2018 with right foot ischemic changes with nausea.  Patient reportedly had scraped his foot approxi-2 months ago while doing some tree work.  X-rays revealed  soft tissue ulcer about the fifth metatarsal phalangeal joint.  No osteomyelitis.  MRI of the foot completed again gas in the fifth metatarsal head consistent with osteomyelitis.  Follow-up orthopedic services initially with conservative care.  Hospital course complicated by anemia hemoglobin 6.7 gastroenterology consulted recommendations of colonoscopy of which patient refused.  Due to ongoing ischemic changes of the right foot cleared for surgery underwent right transtibial amputation 11/16/2018 per Dr. Lajoyce Corners.  Hospital course pain management as well as leukocytosis maintained on antibiotic therapy.  Developed atrial fibrillation with runs of SVT cardiology consulted he did receive IV Lopressor and Cardizem with little response patient converted to sinus rhythm on the evening of 11/15/2018.  Echocardiogram with ejection fraction 55% moderate hypokinesis of the left ventricular entire anterior septal wall.  Placed on amiodarone 200 mg twice daily.  Leukocytosis improved to 16,000 antibiotic therapy discontinued hemoglobin stabilize 8.5.  Patient was admitted for a comprehensive rehab program  Hospital Course: Joseph [name] was admitted to rehab 11/20/2018 for inpatient therapies to consist of PT, ST and OT at least three hours five days a week. Past admission physiatrist, therapy team and rehab RN have worked together to provide customized collaborative inpatient rehab.  Pertaining to patient right BKA 11/16/2018 he would follow-up orthopedic services.  Pain management use of Neurontin scheduled as well as Robaxin oxycodone  as needed.  He was fitted with a stump shrinker.  Acute blood loss anemia 8.3 patient had refused any GI work-up.  Acute onset atrial fibrillation amiodarone 200 mg twice daily he would follow-up cardiology services.  Patient exhibited no signs of fluid overload.  Blood sugars overall controlled hemoglobin A1c 5.7 he would follow-up with his primary MD. patient had been on Glucophage prior to  admission latest creatinine 1.73 thus metformin not resumed.  Patient was on low-dose Levemir while in the hospital which he adamantly refused at time of discharge he would continue with low-dose Glucotrol.  CKD stage II latest creatinine 1.73.  Bouts of constipation resolved with laxative assistance.  Physical exam.  Blood pressure 107/75 pulse 78 temperature 98.8 respirations 16 oxygen saturations 98% room air Constitutional.  Alert and oriented HEENT Head.  Normocephalic and atraumatic Eyes.  Pupils round and reactive to light without discharge no nystagmus Neck supple nontender no JVD without thyromegaly Cardiovascular normal rate and rhythm without murmur Respiratory.  Effort normal breath sounds normal no respiratory distress without wheezes GI.  Soft exhibits no distention nontender Musculoskeletal right leg had a VAC dressing in place associated edema Neurological.  Alert and oriented upper extremities 5 out of 5 right lower extremity 3 out of 5 hip flexion, left lower extremity 4 out of 5 hip flexion knee extension ankle dorsi plantarflexion.  Rehab course: During patient's stay in rehab weekly team conferences were held to monitor patient's progress, set goals and discuss barriers to discharge. At admission, patient required minimal assist lateral scoot transfers, moderate assist sit to supine, minimal assist supine to sit.  Set up upper body bathing and dressing max assist lower body bathing dressing max assist toilet transfers  He  has had improvement in activity tolerance, balance, postural control as well as ability to compensate for deficits. He has had improvement in functional use RUE/LUE  and RLE/LLE as well as improvement in awareness.  Working with energy conservation techniques.  Patient performs squat pivot transfers x3 supervision minimal cues.  Ambulates 50 feet rolling walker contact-guard assist.  Propels wheelchair modified independent.  Patient completed bed mobility at  edge of bed modified independent.  Completed squat pivot transfers to wheelchair modified independent with good safety awareness.  Completed squat pivot from wheelchair to shower chair and a rolling shower with supervision.  Completed all bathing seated on shower chair distant supervision.  Family teaching completed plan discharge to home       Disposition: Discharge disposition: 01-Home or Self Care     Discharge to home   Diet: Diabetic diet  Special Instructions: No smoking driving or alcohol  Medications at discharge 1.  Amiodarone 200 mg daily 2.  Lasix 40 mg p.o. daily 3.  Neurontin 300 mg p.o. 3 times daily 4.  Glucotrol 5 mg p.o. daily 5.  Levemir 8 units nightly. Patient currently refusing. 6.  Robaxin 500 mg every 6 hours as needed muscle spasms 7.  Oxycodone 5 to 10 mg every 4 hours as needed pain 8.  Protonix 40 mg p.o. twice daily 9.  Potassium chloride 20 mEq p.o. daily   Discharge Instructions    Ambulatory referral to Physical Medicine Rehab   Complete by: As directed    Moderate complexity follow-up 1 to 2 weeks right BKA      Follow-up Information    Marcello FennelPatel, Ankit Anil, MD Follow up.   Specialty: Physical Medicine and Rehabilitation Why: Office to call for appointment Contact information: [address] [place] [address] [phone]        Newt Minion, MD Follow up.   Specialty: Orthopedic Surgery Why: Call for appointment Contact information: [place] [address] [phone]        Fay Records, MD Follow up.   Specialty: Cardiology Why: Call for appointment Contact information: [place] [address] [phone]        Charlott Rakes, MD. Go on 12/11/2018.   Specialty: Family Medicine Why: @ 9:30am Contact information: [place] [address] [phone]           Signed: Cathlyn Parsons 12/04/2018, 5:24 AM Delice Lesch,  MD, ABPMR

## 2018-12-04 DIAGNOSIS — E1165 Type 2 diabetes mellitus with hyperglycemia: Secondary | ICD-10-CM

## 2018-12-04 LAB — GLUCOSE, CAPILLARY: Glucose-Capillary: 110 mg/dL — ABNORMAL HIGH (ref 70–99)

## 2018-12-04 MED ORDER — OXYCODONE HCL 5 MG PO TABS: 5.0000 mg | ORAL_TABLET | ORAL | 0 refills | Status: DC | PRN

## 2018-12-04 NOTE — Progress Notes (Signed)
Patient is scheduled for discharge today. Discharge Instructions provided by Helyn Numbers., PA-C. Patient discharged to Home with Discharge Instructions; Personal Belongings and Equipment. Patient transported off unit via wheelchair accompanied by NT & RN. "Niece" awaiting at Main Entrance to transport Patient Home via personal vehicle.

## 2018-12-04 NOTE — Plan of Care (Signed)
  Problem: RH SKIN INTEGRITY Goal: RH STG SKIN FREE OF INFECTION/BREAKDOWN Description: Assess incision site every shift. Dressing change per order. 12/04/2018 1637 by Dietrich Pates, RN Outcome: Completed/Met  Problem: RH PAIN MANAGEMENT Goal: RH STG PAIN MANAGED AT OR BELOW PT'S PAIN GOAL Description: Pain level less than 5 on scale 0-10 12/04/2018 1637 by Dietrich Pates, RN Outcome: Completed/Met  Problem: RH KNOWLEDGE DEFICIT GENERAL Goal: RH STG INCREASE KNOWLEDGE OF SELF CARE AFTER HOSPITALIZATION Description: Pt will be able to demonstrate dressing change to stump site.  12/04/2018 1637 by Dietrich Pates, RN Outcome: Completed/Met

## 2018-12-04 NOTE — Progress Notes (Signed)
Social Work Discharge Note   The overall goal for the admission was met for:   Discharge location: Yes - home with family to provide   24/ 7 support  Length of Stay: Yes  Discharge activity level: Yes  Home/community participation: Yes  Services provided included: MD, RD, PT, OT, RN, Pharmacy and Hope: Medicare  Follow-up services arranged: Home Health: RN, PT, OT via [facility] , DME: 18x18 lightweight w/c with right amputee support pad, cushion, tall walker, 3n1 commode and tub bench via [employer] [facility] and Patient/Family has no preference for HH/DME agencies  Comments (or additional information):       Contact info:  Pt @ [phone]  Patient/Family verbalized understanding of follow-up arrangements: Yes  Individual responsible for coordination of the follow-up plan: pt  Confirmed correct DME delivered: Hunner Garcon 12/04/2018    Neshawn Aird

## 2018-12-04 NOTE — Plan of Care (Signed)
  Problem: Consults Goal: RH GENERAL PATIENT EDUCATION Description: See Patient Education module for education specifics. Outcome: Adequate for Discharge Goal: Skin Care Protocol Initiated - if Braden Score 18 or less Description: If consults are not indicated, leave blank or document N/A Outcome: Adequate for Discharge Goal: Nutrition Consult-if indicated Description: Meal intake greater than 60% Outcome: Adequate for Discharge Goal: Diabetes Guidelines if Diabetic/Glucose > 140 Description: If diabetic or lab glucose is > 140 mg/dl - Initiate Diabetes/Hyperglycemia Guidelines & Document Interventions  Outcome: Adequate for Discharge   Problem: RH SKIN INTEGRITY Goal: RH STG SKIN FREE OF INFECTION/BREAKDOWN Description: Assess incision site every shift. Dressing change per order. Outcome: Adequate for Discharge   Problem: RH PAIN MANAGEMENT Goal: RH STG PAIN MANAGED AT OR BELOW PT'S PAIN GOAL Description: Pain level less than 5 on scale 0-10 Outcome: Adequate for Discharge   Problem: RH KNOWLEDGE DEFICIT GENERAL Goal: RH STG INCREASE KNOWLEDGE OF SELF CARE AFTER HOSPITALIZATION Description: Pt will be able to demonstrate dressing change to stump site.  Outcome: Adequate for Discharge

## 2018-12-04 NOTE — Plan of Care (Deleted)
  Problem: Consults Goal: RH GENERAL PATIENT EDUCATION Description: See Patient Education module for education specifics. 12/04/2018 1637 by Dietrich Pates, RN Outcome: Completed/Met 12/04/2018 0951 by Dietrich Pates, RN Outcome: Adequate for Discharge 12/04/2018 0950 by Dietrich Pates, RN Outcome: Adequate for Discharge Goal: Skin Care Protocol Initiated - if Braden Score 18 or less Description: If consults are not indicated, leave blank or document N/A 12/04/2018 1637 by Dietrich Pates, RN Outcome: Completed/Met 12/04/2018 0951 by Dietrich Pates, RN Outcome: Adequate for Discharge 12/04/2018 0950 by Dietrich Pates, RN Outcome: Adequate for Discharge Goal: Nutrition Consult-if indicated Description: Meal intake greater than 60% 12/04/2018 1637 by Dietrich Pates, RN Outcome: Completed/Met 12/04/2018 0951 by Dietrich Pates, RN Outcome: Adequate for Discharge 12/04/2018 0950 by Dietrich Pates, RN Outcome: Adequate for Discharge Goal: Diabetes Guidelines if Diabetic/Glucose > 140 Description: If diabetic or lab glucose is > 140 mg/dl - Initiate Diabetes/Hyperglycemia Guidelines & Document Interventions  12/04/2018 1637 by Dietrich Pates, RN Outcome: Completed/Met 12/04/2018 0951 by Dietrich Pates, RN Outcome: Adequate for Discharge 12/04/2018 0950 by Dietrich Pates, RN Outcome: Adequate for Discharge

## 2018-12-06 ENCOUNTER — Telehealth: Payer: Self-pay

## 2018-12-06 ENCOUNTER — Telehealth: Payer: Self-pay | Admitting: Orthopedic Surgery

## 2018-12-06 NOTE — Telephone Encounter (Addendum)
TRANSITIONAL CARE CALL  Appointment Date, Time, Arrival Time: 12-12-2018 / 12:20pm / 12:00pm  With: Dr. Posey Pronto  Transitional Care Questions   Questions for our staff to ask patients on Transitional care 48 hour phone call:   1. Are you/is patient experiencing any problems since coming home? Are there any questions regarding any aspect of care? NO NEW PROBLEMS, EXERCISING DAILY AND STATES IMPROVING DAILY  2. Are there any questions regarding medications administration/dosing? Are meds being taken as prescribed? Patient should review meds with caller to confirm. NO QUESTIONS, AND IS TAKING AS PRESCRIBED WITH ADDITIONAL MENS MULTIVITIMIN  3. Have there been any falls? NO  4. Has Home Health been to the house and/or have they contacted you? If not, have you tried to contact them? Can we help you contact them? YES, NA, NA  5. Are bowels and bladder emptying properly? Are there any unexpected incontinence issues? If applicable, is patient following bowel/bladder programs? YES  6. Any fevers, problems with breathing, unexpected pain? NO  7. Are there any skin problems or new areas of breakdown? NO  8. Has the patient/family member arranged specialty MD follow up (ie cardiology/neurology/renal/surgical/etc)? Can we help arrange? YES  9. Does the patient need any other services or support that we can help arrange? NO  10. Are caregivers following through as expected in assisting the patient? YES  11. Has the patient quit smoking, drinking alcohol, or using drugs as recommended? NA    [facility] [address] [phone]

## 2018-12-07 ENCOUNTER — Other Ambulatory Visit: Payer: Self-pay

## 2018-12-07 ENCOUNTER — Encounter: Payer: Self-pay | Admitting: Physician Assistant

## 2018-12-07 ENCOUNTER — Ambulatory Visit (INDEPENDENT_AMBULATORY_CARE_PROVIDER_SITE_OTHER): Payer: Medicare Other | Admitting: Physician Assistant

## 2018-12-07 VITALS — Ht 76.0 in | Wt 189.6 lb

## 2018-12-07 DIAGNOSIS — I739 Peripheral vascular disease, unspecified: Secondary | ICD-10-CM

## 2018-12-07 DIAGNOSIS — E1142 Type 2 diabetes mellitus with diabetic polyneuropathy: Secondary | ICD-10-CM

## 2018-12-07 DIAGNOSIS — Z89511 Acquired absence of right leg below knee: Secondary | ICD-10-CM

## 2018-12-07 DIAGNOSIS — E43 Unspecified severe protein-calorie malnutrition: Secondary | ICD-10-CM

## 2018-12-07 DIAGNOSIS — N182 Chronic kidney disease, stage 2 (mild): Secondary | ICD-10-CM

## 2018-12-07 DIAGNOSIS — I5032 Chronic diastolic (congestive) heart failure: Secondary | ICD-10-CM

## 2018-12-07 MED ORDER — OXYCODONE HCL 5 MG PO TABS: 5.0000 mg | ORAL_TABLET | ORAL | 0 refills | Status: DC | PRN

## 2018-12-07 MED ORDER — DOXYCYCLINE HYCLATE 100 MG PO CAPS: 100.0000 mg | ORAL_CAPSULE | Freq: Two times a day (BID) | ORAL | 1 refills | Status: DC

## 2018-12-07 NOTE — Progress Notes (Signed)
Office Visit Note   Patient: Joseph [name]           Date of Birth: 06-Feb-1946           MRN: [id number] Visit Date: 12/07/2018              Requested by: Hoy RegisterNewlin, Enobong, MD [address] [place],  [place] [id number] PCP: Hoy RegisterNewlin, Enobong, MD  Chief Complaint  Patient presents with  . Right Leg - Routine Post Op    11/16/18 right BKA       HPI: The patient is a 73 yo gentleman who is seen for post operative follow up following a right transtibial amputation on 11/16/2018.  He went to inpatient rehab [facility] during his [facility]hab stay.  He reports some pain over the area residually. He is wearing a Software engineerVive shrinker and limb protector.   Assessment & Plan: Visit Diagnoses:  1. 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 Diabetic polyneuropathy associated with type 2 diabetes mellitus (HCC)   3. CKD (chronic kidney disease), stage II   4. PVD (peripheral vascular disease) (HCC)   5. Chronic diastolic congestive heart failure (HCC)     Plan: Begin Doxycycline 100 mg BID x 2 weeks. Also recommended whey protein supplements and discussed getting in at least 30 grams of extra protein daily .  Will leave staples in place another week.  Wash area daily with soap and water and then apply Vive shrinker around the clock .  Follow up in 1 week.   Follow-Up Instructions: Return in about 1 week (around 12/14/2018).   Ortho Exam  Patient is alert, oriented, no adenopathy, well-dressed, normal affect, normal respiratory effort. The right transtibial amputation site staples are intact.  Over the central incisional line there is some epidermal lysis and mild erythema over the inferior incisional border.  There is no significant drainage.  There is good knee extension.  There is edema over the distal residual limb.  Imaging: No results found.   Labs: Lab Results  Component Value Date   HGBA1C 6.4 (H) 11/18/2018   HGBA1C 5.7 10/17/2018   HGBA1C 9.1  09/28/2013   REPTSTATUS 11/18/2018 FINAL 11/13/2018   CULT  11/13/2018    NO GROWTH 5 DAYS Performed at [facility], [address] [place], [place] [address]      Lab Results  Component Value Date   ALBUMIN 1.8 (L) 11/21/2018   ALBUMIN 2.2 (L) 11/15/2018   ALBUMIN 2.6 (L) 11/13/2018    Lab Results  Component Value Date   MG 2.0 11/15/2018   No results found for: VD25OH  No results found for: PREALBUMIN CBC EXTENDED Latest Ref Rng & Units 12/03/2018 11/30/2018 11/26/2018  WBC 4.0 - 10.5 K/uL 9.2 8.5 10.0  RBC 4.22 - 5.81 MIL/uL 3.28(L) 2.83(L) 3.01(L)  HGB 13.0 - 17.0 g/dL 8.2(N9.6(L) 8.3(L) 8.8(L)  HCT 39.0 - 52.0 % 30.4(L) 25.8(L) 28.1(L)  PLT 150 - 400 K/uL 394 387 470(H)  NEUTROABS 1.7 - 7.7 K/uL 4.9 3.6 5.7  LYMPHSABS 0.7 - 4.0 K/uL 3.3 3.9 3.6     Body mass index is 23.08 kg/m.  Orders:  No orders of the defined types were placed in this encounter.  Meds ordered this encounter  Medications  . oxyCODONE (OXY IR/ROXICODONE) 5 MG immediate release tablet    Sig: Take 1-2 tablets (5-10 mg total) by mouth every 4 (four) hours as needed for moderate pain (pain  score 4-6).    Dispense:  30 tablet    Refill:  0  . doxycycline (VIBRAMYCIN) 100 MG capsule    Sig: Take 1 capsule (100 mg total) by mouth 2 (two) times daily.    Dispense:  28 capsule    Refill:  1     Procedures: No procedures performed  Clinical Data: No additional findings.  ROS:  All other systems negative, except as noted in the HPI. Review of Systems  Objective: Vital Signs: Ht 6\' 4"  (1.93 m)   Wt 189 lb 9.6 oz (86 kg)   BMI 23.08 kg/m   Specialty Comments:  No specialty comments available.  PMFS History: Patient Active Problem List   Diagnosis Date Noted  . Postoperative pain   . AKI (acute kidney injury) ([place])   . Acute blood loss anemia   . Chronic diastolic congestive heart failure ([place])   . Diabetic peripheral neuropathy ([place])   . CKD (chronic kidney disease), stage II    . Hypoalbuminemia due to protein-calorie malnutrition ([place])   . Leukocytosis   . 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[place]) 11/20/2018  . Cellulitis of right lower extremity   . Atrial fibrillation ([place])   . Gangrene of right foot ([place])   . Diabetic polyneuropathy associated with type 2 diabetes mellitus ([place])   . PVD (peripheral vascular disease) ([place])   . Critical lower limb ischemia   . Sepsis with acute renal failure ([place]) 11/13/2018  . Acute renal failure superimposed on stage 2 chronic kidney disease ([place])   . Open wound of right foot   . 'light-for-dates' infant with signs of fetal malnutrition 10/17/2018  . DM (diabetes mellitus) ([place]) 07/02/2012  . Hypertension associated with diabetes ([place]) 07/02/2012   Past Medical History:  Diagnosis Date  . Diabetes mellitus type 2 in nonobese (HCC)   . Hypertension     Family History  Problem Relation Age of Onset  . Diabetes Father     Past Surgical History:  Procedure Laterality Date  . AMPUTATION Right 11/16/2018   Procedure: RIGHT BELOW KNEE AMPUTATION;  Surgeon: Newt Minion, MD;  Location: [facility];  Service: Orthopedics;  Laterality: Right;  . HAND SURGERY     Social History   Occupational History  . Not on file  Tobacco Use  . Smoking status: Never Smoker  . Smokeless tobacco: Never Used  Substance and Sexual Activity  . Alcohol use: Yes  . Drug use: No  . Sexual activity: Not on file

## 2018-12-11 ENCOUNTER — Inpatient Hospital Stay: Payer: Medicare Other | Admitting: Family Medicine

## 2018-12-12 ENCOUNTER — Encounter: Payer: Medicare Other | Attending: Physical Medicine & Rehabilitation | Admitting: Physical Medicine & Rehabilitation

## 2018-12-14 ENCOUNTER — Ambulatory Visit: Payer: Medicare Other | Admitting: Physician Assistant

## 2018-12-18 DIAGNOSIS — E1152 Type 2 diabetes mellitus with diabetic peripheral angiopathy with gangrene: Secondary | ICD-10-CM | POA: Diagnosis not present

## 2018-12-18 DIAGNOSIS — A419 Sepsis, unspecified organism: Secondary | ICD-10-CM | POA: Diagnosis not present

## 2018-12-18 DIAGNOSIS — E1142 Type 2 diabetes mellitus with diabetic polyneuropathy: Secondary | ICD-10-CM | POA: Diagnosis not present

## 2018-12-18 DIAGNOSIS — K5900 Constipation, unspecified: Secondary | ICD-10-CM | POA: Diagnosis not present

## 2018-12-18 DIAGNOSIS — M869 Osteomyelitis, unspecified: Secondary | ICD-10-CM | POA: Diagnosis not present

## 2018-12-18 DIAGNOSIS — I152 Hypertension secondary to endocrine disorders: Secondary | ICD-10-CM | POA: Diagnosis not present

## 2018-12-18 DIAGNOSIS — Z4781 Encounter for orthopedic aftercare following surgical amputation: Secondary | ICD-10-CM | POA: Diagnosis not present

## 2018-12-18 DIAGNOSIS — Z89511 Acquired absence of right leg below knee: Secondary | ICD-10-CM | POA: Diagnosis not present

## 2018-12-18 DIAGNOSIS — Z7984 Long term (current) use of oral hypoglycemic drugs: Secondary | ICD-10-CM | POA: Diagnosis not present

## 2018-12-18 DIAGNOSIS — E1169 Type 2 diabetes mellitus with other specified complication: Secondary | ICD-10-CM | POA: Diagnosis not present

## 2018-12-18 DIAGNOSIS — I4891 Unspecified atrial fibrillation: Secondary | ICD-10-CM | POA: Diagnosis not present

## 2018-12-18 DIAGNOSIS — E46 Unspecified protein-calorie malnutrition: Secondary | ICD-10-CM | POA: Diagnosis not present

## 2018-12-18 DIAGNOSIS — E1159 Type 2 diabetes mellitus with other circulatory complications: Secondary | ICD-10-CM | POA: Diagnosis not present

## 2018-12-18 DIAGNOSIS — N182 Chronic kidney disease, stage 2 (mild): Secondary | ICD-10-CM | POA: Diagnosis not present

## 2018-12-18 DIAGNOSIS — I5032 Chronic diastolic (congestive) heart failure: Secondary | ICD-10-CM | POA: Diagnosis not present

## 2018-12-20 ENCOUNTER — Other Ambulatory Visit: Payer: Self-pay | Admitting: Physician Assistant

## 2018-12-20 ENCOUNTER — Telehealth: Payer: Self-pay | Admitting: Family Medicine

## 2018-12-20 ENCOUNTER — Telehealth: Payer: Self-pay | Admitting: Physical Medicine & Rehabilitation

## 2018-12-20 DIAGNOSIS — E1169 Type 2 diabetes mellitus with other specified complication: Secondary | ICD-10-CM | POA: Diagnosis not present

## 2018-12-20 DIAGNOSIS — E1152 Type 2 diabetes mellitus with diabetic peripheral angiopathy with gangrene: Secondary | ICD-10-CM | POA: Diagnosis not present

## 2018-12-20 DIAGNOSIS — M869 Osteomyelitis, unspecified: Secondary | ICD-10-CM | POA: Diagnosis not present

## 2018-12-20 DIAGNOSIS — Z4781 Encounter for orthopedic aftercare following surgical amputation: Secondary | ICD-10-CM | POA: Diagnosis not present

## 2018-12-20 DIAGNOSIS — I4891 Unspecified atrial fibrillation: Secondary | ICD-10-CM | POA: Diagnosis not present

## 2018-12-20 DIAGNOSIS — A419 Sepsis, unspecified organism: Secondary | ICD-10-CM | POA: Diagnosis not present

## 2018-12-20 NOTE — Telephone Encounter (Signed)
RN Moshe Salisbury called to request home health nursing orders due to patients recent amputation  -1week1 -2week1 -1week2 -Physical therapy Please follow up, she is aware pt needs an appointment with a PCP at our location and agreed to make sure he schedules another appointment in our location in the next couple days.Follow up if possible  [phone] p

## 2018-12-20 NOTE — Telephone Encounter (Signed)
I am unable to do this- I haven't seen this patient.  Can you send this to Dr Margarita Rana or which ever PCP he is going to be assigned to?  Thank you!

## 2018-12-20 NOTE — Telephone Encounter (Signed)
Sharyn Lull PT with [facility] needs to get verbal orders to see patient 1w4.  Patients SOC was today.  Please call her at [phone].

## 2018-12-20 NOTE — Telephone Encounter (Signed)
I looked at this again.  He needs an office appt asap to be assigned a PCP and to document the need for home health/PT bc he hasn't been seen here since he had the amputation. Thanks, Freeman Caldron, PA-C

## 2018-12-21 NOTE — Telephone Encounter (Signed)
Called her back and approved verbal orders per clinical protocol

## 2018-12-24 ENCOUNTER — Encounter: Payer: Self-pay | Admitting: Physician Assistant

## 2018-12-24 ENCOUNTER — Telehealth: Payer: Self-pay | Admitting: Orthopedic Surgery

## 2018-12-24 ENCOUNTER — Ambulatory Visit (INDEPENDENT_AMBULATORY_CARE_PROVIDER_SITE_OTHER): Payer: Medicare Other | Admitting: Physician Assistant

## 2018-12-24 VITALS — Ht 76.0 in | Wt 189.0 lb

## 2018-12-24 DIAGNOSIS — E1165 Type 2 diabetes mellitus with hyperglycemia: Secondary | ICD-10-CM

## 2018-12-24 DIAGNOSIS — N182 Chronic kidney disease, stage 2 (mild): Secondary | ICD-10-CM

## 2018-12-24 DIAGNOSIS — Z89511 Acquired absence of right leg below knee: Secondary | ICD-10-CM

## 2018-12-24 DIAGNOSIS — I5032 Chronic diastolic (congestive) heart failure: Secondary | ICD-10-CM

## 2018-12-24 DIAGNOSIS — E43 Unspecified severe protein-calorie malnutrition: Secondary | ICD-10-CM

## 2018-12-24 MED ORDER — GLIPIZIDE 5 MG PO TABS: 5.0000 mg | ORAL_TABLET | Freq: Every day | ORAL | 0 refills | Status: DC

## 2018-12-24 MED ORDER — POTASSIUM CHLORIDE CRYS ER 20 MEQ PO TBCR: 20.0000 meq | EXTENDED_RELEASE_TABLET | Freq: Every day | ORAL | 1 refills | Status: DC

## 2018-12-24 MED ORDER — GABAPENTIN 300 MG PO CAPS: 300.0000 mg | ORAL_CAPSULE | Freq: Three times a day (TID) | ORAL | 1 refills | Status: DC

## 2018-12-24 MED ORDER — FUROSEMIDE 40 MG PO TABS: 40.0000 mg | ORAL_TABLET | Freq: Every day | ORAL | 1 refills | Status: DC

## 2018-12-24 MED ORDER — METHOCARBAMOL 500 MG PO TABS: 500.0000 mg | ORAL_TABLET | Freq: Four times a day (QID) | ORAL | 0 refills | Status: DC | PRN

## 2018-12-24 MED ORDER — OXYCODONE HCL 5 MG PO TABS: 5.0000 mg | ORAL_TABLET | Freq: Four times a day (QID) | ORAL | 0 refills | Status: DC | PRN

## 2018-12-24 MED ORDER — AMIODARONE HCL 200 MG PO TABS: 200.0000 mg | ORAL_TABLET | Freq: Every day | ORAL | 0 refills | Status: DC

## 2018-12-24 MED ORDER — PANTOPRAZOLE SODIUM 40 MG PO TBEC: 40.0000 mg | DELAYED_RELEASE_TABLET | Freq: Two times a day (BID) | ORAL | 1 refills | Status: DC

## 2018-12-24 MED FILL — METHOCARBAMOL 500 MG TABS: 500 | 15 days supply | Qty: 60 | Fill #0

## 2018-12-24 NOTE — Progress Notes (Signed)
Office Visit Note   Patient: Joseph [name]           Date of Birth: 08/01/1945           MRN: [id number] Visit Date: 12/24/2018              Requested by: Charlott Rakes, MD [place],  [place] [address] PCP: Patient, No Pcp Per  Chief Complaint  Patient presents with  . Right Leg - Routine Post Op    11/16/18 right BKA       HPI: The patient is a 73 year old gentleman who is seen for postoperative follow-up following a right transtibial amputation on 11/16/2018.  He has been washing the residual limb daily with soap and water and applying a Vive shrinker stocking and limb protector.  He reports some residual pain over the area.  He is working with [facility].  Assessment & Plan: Visit Diagnoses:  1. Acquired absence of right lower extremity below knee ([place])   2. Type 2 diabetes mellitus with hyperglycemia, unspecified whether long term insulin use ([place])   3. CKD (chronic kidney disease), stage II   4. Severe protein-calorie malnutrition ([place])   5. Chronic diastolic congestive heart failure ([place])     Plan: Staples will be harvested this visit. The patient was instructed to continue to wash the residual limb daily with soap and water and apply his Vive shrinker directly in contact with the skin and wear around the clock. Continue protein supplement and vitamins.  Prescription For [facility] for a K3 prosthesis -the patient should be a community ambulator walking at varying speeds on a regular basis for attending activities such as shopping and attending community services and events.  He needs the ability to change speeds while walking in public places and also be able to walk on uneven surfaces such as grass, gravel, curbs, ramps, stairs and bleachers. The patient reports that he is about to run out of his usual medications due to his chronic diastolic heart failure, etc. and these prescriptions were refilled as a convenience to him this visit.  He has  had some difficulty arranging for transportation to and from visits but will need to see his primary care provider over the next month for posthospitalization follow-up. He will follow-up here in 2 weeks or sooner should he have difficulties in the interim.  Follow-Up Instructions: Return in about 2 weeks (around 01/07/2019).   Ortho Exam  Patient is alert, oriented, no adenopathy, well-dressed, normal affect, normal respiratory effort. The right transtibial amputation site has some superficial epidermal lysis and scabbing over the central incision line.  There are no signs of infection or cellulitis.  There is no drainage today.  The staples will be harvested this visit.  He has full knee extension.  Imaging: No results found.   Labs: Lab Results  Component Value Date   HGBA1C 6.4 (H) 11/18/2018   HGBA1C 5.7 10/17/2018   HGBA1C 9.1 09/28/2013   REPTSTATUS 11/18/2018 FINAL 11/13/2018   CULT  11/13/2018    NO GROWTH 5 DAYS Performed at [facility], [place] [address], [place], [place] [address]      Lab Results  Component Value Date   ALBUMIN 1.8 (L) 11/21/2018   ALBUMIN 2.2 (L) 11/15/2018   ALBUMIN 2.6 (L) 11/13/2018    Lab Results  Component Value Date   MG 2.0 11/15/2018   No results found for: VD25OH  No results found for: PREALBUMIN CBC  EXTENDED Latest Ref Rng & Units 12/03/2018 11/30/2018 11/26/2018  WBC 4.0 - 10.5 K/uL 9.2 8.5 10.0  RBC 4.22 - 5.81 MIL/uL 3.28(L) 2.83(L) 3.01(L)  HGB 13.0 - 17.0 g/dL 1.6(X9.6(L) 8.3(L) 8.8(L)  HCT 39.0 - 52.0 % 30.4(L) 25.8(L) 28.1(L)  PLT 150 - 400 K/uL 394 387 470(H)  NEUTROABS 1.7 - 7.7 K/uL 4.9 3.6 5.7  LYMPHSABS 0.7 - 4.0 K/uL 3.3 3.9 3.6     Body mass index is 23.01 kg/m.  Orders:  No orders of the defined types were placed in this encounter.  Meds ordered this encounter  Medications  . oxyCODONE (OXY IR/ROXICODONE) 5 MG immediate release tablet    Sig: Take 1-2 tablets (5-10 mg total) by mouth every 6 (six) hours as  needed for severe pain (pain score 4-6).    Dispense:  20 tablet    Refill:  0  . glipiZIDE (GLUCOTROL) 5 MG tablet    Sig: Take 1 tablet (5 mg total) by mouth daily before breakfast.    Dispense:  90 tablet    Refill:  0  . potassium chloride SA (K-DUR) 20 MEQ tablet    Sig: Take 1 tablet (20 mEq total) by mouth daily.    Dispense:  30 tablet    Refill:  1  . pantoprazole (PROTONIX) 40 MG tablet    Sig: Take 1 tablet (40 mg total) by mouth 2 (two) times daily.    Dispense:  60 tablet    Refill:  1  . methocarbamol (ROBAXIN) 500 MG tablet    Sig: Take 1 tablet (500 mg total) by mouth every 6 (six) hours as needed for muscle spasms.    Dispense:  60 tablet    Refill:  0  . gabapentin (NEURONTIN) 300 MG capsule    Sig: Take 1 capsule (300 mg total) by mouth 3 (three) times daily.    Dispense:  90 capsule    Refill:  1  . furosemide (LASIX) 40 MG tablet    Sig: Take 1 tablet (40 mg total) by mouth daily.    Dispense:  30 tablet    Refill:  1  . amiodarone (PACERONE) 200 MG tablet    Sig: Take 1 tablet (200 mg total) by mouth daily.    Dispense:  30 tablet    Refill:  0     Procedures: No procedures performed  Clinical Data: No additional findings.  ROS:  All other systems negative, except as noted in the HPI. Review of Systems  Objective: Vital Signs: Ht 6\' 4"  (1.93 m)   Wt 189 lb (85.7 kg)   BMI 23.01 kg/m   Specialty Comments:  No specialty comments available.  PMFS History: Patient Active Problem List   Diagnosis Date Noted  . Postoperative pain   . AKI (acute kidney injury) (HCC)   . Acute blood loss anemia   . Chronic diastolic congestive heart failure (HCC)   . Diabetic peripheral neuropathy (HCC)   . CKD (chronic kidney disease), stage II   . 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 Leukocytosis   . 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 Cellulitis of right lower extremity   . Atrial fibrillation (HCC)   . Gangrene of  right foot (HCC)   . Diabetic polyneuropathy associated with type 2 diabetes mellitus (HCC)   . PVD (peripheral vascular disease) (HCC)   . Critical lower limb ischemia   . Sepsis with acute renal failure (HCC) 11/13/2018  . Acute renal  failure superimposed on stage 2 chronic kidney disease (HCC)   . Open wound of right foot   . 'light-for-dates' infant with signs of fetal malnutrition 10/17/2018  . DM (diabetes mellitus) (HCC) 07/02/2012  . Hypertension associated with diabetes (HCC) 07/02/2012   Past Medical History:  Diagnosis Date  . Diabetes mellitus type 2 in nonobese (HCC)   . Hypertension     Family History  Problem Relation Age of Onset  . Diabetes Father     Past Surgical History:  Procedure Laterality Date  . AMPUTATION Right 11/16/2018   Procedure: RIGHT BELOW KNEE AMPUTATION;  Surgeon: Nadara Mustarduda, Marcus V, MD;  [facility];  Service: Orthopedics;  Laterality: Right;  . HAND SURGERY     Social History   Occupational History  . Not on file  Tobacco Use  . Smoking status: Never Smoker  . Smokeless tobacco: Never Used  Substance and Sexual Activity  . Alcohol use: Yes  . Drug use: No  . Sexual activity: Not on file

## 2018-12-24 NOTE — Telephone Encounter (Signed)
I do not see an upcoming appointment on the schedule and I have never seen the patient- you can let home health know that I have not seen him and see if Levada Dy needs to sign for now until he comes in to see me or they can take verbal order from me for two weeks between now and his appointment. Not sure what happens if he does not show for the appointment

## 2018-12-24 NOTE — Telephone Encounter (Signed)
RN Moshe Salisbury called to request home health nursing orders due to patients recent amputation  -1week1 Joseph [name] -1week2 -Physical therapy Until his next appt with you on 01/04/2019

## 2018-12-24 NOTE — Telephone Encounter (Signed)
Pt has an appt today will call once visit has taken place to update orders.

## 2018-12-24 NOTE — Telephone Encounter (Signed)
Cara-nurse with [facility] called needing skilled nursing orders  1 Wk 1 last week for start of care,2 Wk 1 and 1 Wk 2. The number to contact Moshe Salisbury is [phone]

## 2018-12-25 NOTE — Telephone Encounter (Signed)
I spoke with Joseph [name] to give verbal ok for PT and HHN they will contact cards for a-fib issues

## 2018-12-26 ENCOUNTER — Telehealth: Payer: Self-pay | Admitting: Orthopedic Surgery

## 2018-12-26 DIAGNOSIS — I4891 Unspecified atrial fibrillation: Secondary | ICD-10-CM | POA: Diagnosis not present

## 2018-12-26 DIAGNOSIS — E1169 Type 2 diabetes mellitus with other specified complication: Secondary | ICD-10-CM | POA: Diagnosis not present

## 2018-12-26 DIAGNOSIS — A419 Sepsis, unspecified organism: Secondary | ICD-10-CM | POA: Diagnosis not present

## 2018-12-26 DIAGNOSIS — M869 Osteomyelitis, unspecified: Secondary | ICD-10-CM | POA: Diagnosis not present

## 2018-12-26 DIAGNOSIS — E1152 Type 2 diabetes mellitus with diabetic peripheral angiopathy with gangrene: Secondary | ICD-10-CM | POA: Diagnosis not present

## 2018-12-26 DIAGNOSIS — Z4781 Encounter for orthopedic aftercare following surgical amputation: Secondary | ICD-10-CM | POA: Diagnosis not present

## 2018-12-26 NOTE — Telephone Encounter (Signed)
[facility] home health agent lmom requesting a verbal order for OT. [facility] callback # [phone]

## 2018-12-26 NOTE — Telephone Encounter (Signed)
Called and sw HHOT to give verbal ok for therapy request below. LM ON VM to call with any questions.

## 2018-12-27 DIAGNOSIS — E1152 Type 2 diabetes mellitus with diabetic peripheral angiopathy with gangrene: Secondary | ICD-10-CM | POA: Diagnosis not present

## 2018-12-27 DIAGNOSIS — A419 Sepsis, unspecified organism: Secondary | ICD-10-CM | POA: Diagnosis not present

## 2018-12-27 DIAGNOSIS — E1169 Type 2 diabetes mellitus with other specified complication: Secondary | ICD-10-CM | POA: Diagnosis not present

## 2018-12-27 DIAGNOSIS — Z4781 Encounter for orthopedic aftercare following surgical amputation: Secondary | ICD-10-CM | POA: Diagnosis not present

## 2018-12-27 DIAGNOSIS — I4891 Unspecified atrial fibrillation: Secondary | ICD-10-CM | POA: Diagnosis not present

## 2018-12-27 DIAGNOSIS — M869 Osteomyelitis, unspecified: Secondary | ICD-10-CM | POA: Diagnosis not present

## 2018-12-28 DIAGNOSIS — I5032 Chronic diastolic (congestive) heart failure: Secondary | ICD-10-CM

## 2018-12-28 DIAGNOSIS — E1142 Type 2 diabetes mellitus with diabetic polyneuropathy: Secondary | ICD-10-CM

## 2018-12-28 DIAGNOSIS — K5900 Constipation, unspecified: Secondary | ICD-10-CM

## 2018-12-28 DIAGNOSIS — I152 Hypertension secondary to endocrine disorders: Secondary | ICD-10-CM | POA: Diagnosis not present

## 2018-12-28 DIAGNOSIS — M869 Osteomyelitis, unspecified: Secondary | ICD-10-CM

## 2018-12-28 DIAGNOSIS — E1169 Type 2 diabetes mellitus with other specified complication: Secondary | ICD-10-CM

## 2018-12-28 DIAGNOSIS — E1159 Type 2 diabetes mellitus with other circulatory complications: Secondary | ICD-10-CM

## 2018-12-28 DIAGNOSIS — Z89511 Acquired absence of right leg below knee: Secondary | ICD-10-CM

## 2018-12-28 DIAGNOSIS — Z4781 Encounter for orthopedic aftercare following surgical amputation: Secondary | ICD-10-CM

## 2018-12-28 DIAGNOSIS — E46 Unspecified protein-calorie malnutrition: Secondary | ICD-10-CM

## 2018-12-28 DIAGNOSIS — A419 Sepsis, unspecified organism: Secondary | ICD-10-CM

## 2018-12-28 DIAGNOSIS — Z7984 Long term (current) use of oral hypoglycemic drugs: Secondary | ICD-10-CM

## 2018-12-28 DIAGNOSIS — E1152 Type 2 diabetes mellitus with diabetic peripheral angiopathy with gangrene: Secondary | ICD-10-CM

## 2018-12-28 DIAGNOSIS — N182 Chronic kidney disease, stage 2 (mild): Secondary | ICD-10-CM

## 2018-12-28 DIAGNOSIS — I4891 Unspecified atrial fibrillation: Secondary | ICD-10-CM

## 2018-12-28 MED FILL — glipiZIDE 5 MG TABS: 5 | 30 days supply | Qty: 30 | Fill #0

## 2018-12-28 MED FILL — FUROSEMIDE 40 MG TAB: 40 | 30 days supply | Qty: 30 | Fill #0

## 2018-12-28 MED FILL — PANTOPRAZOLE SOD DR 40 MG T: 40 | 30 days supply | Qty: 60 | Fill #0

## 2018-12-28 MED FILL — AMIODARONE HCL 200 MG TAB: 200 | 30 days supply | Qty: 30 | Fill #0

## 2018-12-28 MED FILL — POTASSIUM CL ER 20 MEQ TAB: 20 | 30 days supply | Qty: 30 | Fill #0

## 2018-12-28 MED FILL — GABAPENTIN 300 MG CAPSULE: 300 | 30 days supply | Qty: 90 | Fill #0

## 2018-12-31 DIAGNOSIS — E1169 Type 2 diabetes mellitus with other specified complication: Secondary | ICD-10-CM | POA: Diagnosis not present

## 2018-12-31 DIAGNOSIS — I4891 Unspecified atrial fibrillation: Secondary | ICD-10-CM | POA: Diagnosis not present

## 2018-12-31 DIAGNOSIS — A419 Sepsis, unspecified organism: Secondary | ICD-10-CM | POA: Diagnosis not present

## 2018-12-31 DIAGNOSIS — Z4781 Encounter for orthopedic aftercare following surgical amputation: Secondary | ICD-10-CM | POA: Diagnosis not present

## 2018-12-31 DIAGNOSIS — M869 Osteomyelitis, unspecified: Secondary | ICD-10-CM | POA: Diagnosis not present

## 2018-12-31 DIAGNOSIS — E1152 Type 2 diabetes mellitus with diabetic peripheral angiopathy with gangrene: Secondary | ICD-10-CM | POA: Diagnosis not present

## 2018-12-31 NOTE — Telephone Encounter (Signed)
Call placed to Cara/[facility] regarding home health orders. She said that they instructed the patient to schedule an appointment at [facility], which he did - appt 01/04/2019.  In the meantime, they obtained home health orders from Dr Sharol Given.

## 2018-12-31 NOTE — Telephone Encounter (Signed)
Okay, it appears that patient's orders will need to be directed to Dr. Sharol Given until patient decides to establish care here or with another primary care provider

## 2019-01-02 DIAGNOSIS — M869 Osteomyelitis, unspecified: Secondary | ICD-10-CM | POA: Diagnosis not present

## 2019-01-02 DIAGNOSIS — E1152 Type 2 diabetes mellitus with diabetic peripheral angiopathy with gangrene: Secondary | ICD-10-CM | POA: Diagnosis not present

## 2019-01-02 DIAGNOSIS — Z4781 Encounter for orthopedic aftercare following surgical amputation: Secondary | ICD-10-CM | POA: Diagnosis not present

## 2019-01-02 DIAGNOSIS — I4891 Unspecified atrial fibrillation: Secondary | ICD-10-CM | POA: Diagnosis not present

## 2019-01-02 DIAGNOSIS — A419 Sepsis, unspecified organism: Secondary | ICD-10-CM | POA: Diagnosis not present

## 2019-01-02 DIAGNOSIS — E1169 Type 2 diabetes mellitus with other specified complication: Secondary | ICD-10-CM | POA: Diagnosis not present

## 2019-01-04 ENCOUNTER — Encounter: Payer: Self-pay | Admitting: Family Medicine

## 2019-01-04 ENCOUNTER — Ambulatory Visit: Payer: Medicare Other | Attending: Family Medicine | Admitting: Family Medicine

## 2019-01-04 ENCOUNTER — Other Ambulatory Visit: Payer: Self-pay

## 2019-01-04 DIAGNOSIS — E1142 Type 2 diabetes mellitus with diabetic polyneuropathy: Secondary | ICD-10-CM | POA: Diagnosis not present

## 2019-01-04 DIAGNOSIS — Z09 Encounter for follow-up examination after completed treatment for conditions other than malignant neoplasm: Secondary | ICD-10-CM

## 2019-01-04 DIAGNOSIS — I739 Peripheral vascular disease, unspecified: Secondary | ICD-10-CM | POA: Diagnosis not present

## 2019-01-04 DIAGNOSIS — D62 Acute posthemorrhagic anemia: Secondary | ICD-10-CM | POA: Diagnosis not present

## 2019-01-04 DIAGNOSIS — I5032 Chronic diastolic (congestive) heart failure: Secondary | ICD-10-CM | POA: Diagnosis not present

## 2019-01-04 DIAGNOSIS — N183 Chronic kidney disease, stage 3 unspecified: Secondary | ICD-10-CM

## 2019-01-04 DIAGNOSIS — I4891 Unspecified atrial fibrillation: Secondary | ICD-10-CM

## 2019-01-04 DIAGNOSIS — Z89511 Acquired absence of right leg below knee: Secondary | ICD-10-CM | POA: Diagnosis not present

## 2019-01-04 DIAGNOSIS — E1122 Type 2 diabetes mellitus with diabetic chronic kidney disease: Secondary | ICD-10-CM

## 2019-01-04 DIAGNOSIS — E440 Moderate protein-calorie malnutrition: Secondary | ICD-10-CM | POA: Diagnosis not present

## 2019-01-04 NOTE — Progress Notes (Signed)
[facility] and got his right foot amputated about 1 month ago.   Need to discuss Medications and what need to be stopped.   143 was his CBG at home this morning.

## 2019-01-04 NOTE — Progress Notes (Signed)
Virtual Visit via Telephone Note  I connected with Joseph [name] on 01/04/19 at  9:10 AM EDT by telephone and verified that I am speaking with the correct person using two identifiers.   I discussed the limitations, risks, security and privacy concerns of performing an evaluation and management service by telephone and the availability of in person appointments. I also discussed with the patient that there may be a patient responsible charge related to this service. The patient expressed understanding and agreed to proceed.  Patient Location: Home Provider Location: [facility] Office Others participating in call: Call initiated by Guillermina [place] Richard, RMA who then transfered the call   History of Present Illness:      73 year old male seen in follow-up of recent hospitalization for right transtibial amputation.  He was initially seen in the office on 10/17/2018 by another provider in order to establish care after several emergency department visits due to a wound to the right foot.  Per visit notes, patient was referred to wound care for further treatment.  Patient reports that he did not keep follow-up appointment with wound care and tried to treat his foot wound at home but the wound became deeper and he started having increased redness, swelling as well as bad smelling drainage from the wound.  He presented to the emergency department for treatment due to increased pain, nausea and decreased appetite and patient was diagnosed with sepsis due to his wound.  Patient also had acute renal failure superimposed on stage II chronic kidney disease due to use of NSAIDs for treatment to help with foot pain as well as due to infection/sepsis.  Patient was also diagnosed with right lateral foot ulcerative dry gangrene and necrotic fifth toe.  He was also found to have critical limb ischemia/peripheral vascular disease and underwent BKA of the right lower extremity on 11/16/2018.  He also required transfusion of 1 unit of  packed red blood cells due to anemia and metformin was discontinued during hospitalization due to creatinine of 1.73.       Patient reports that he feels well at this time.  He has been receiving home health care.  He checks his blood sugars at home and they are ranging in the 140s or less fasting and 160s to 180s after meals.  Patient states that prior to admission to the hospital he had been taking a lot of over-the-counter pain medicines such as ibuprofen and Tylenol for pain and patient also felt that he had been growing weaker prior to his hospitalization.  Patient states that now with the physical therapy as well as increase in nutrition and use of over-the-counter whey protein powder he is feeling much stronger.  Patient denies any issues with abdominal pain, no blood in the stool and no black stools.  He is not sure why he is on Protonix 40 mg twice daily and wonders if he could stop this medication.  He states that he is otherwise stable no chest pain or palpitations, no shortness of breath or cough, no fever or chills.  He denies any visual disturbance, no increased urinary frequency and no increased thirst.  He denies any left lower extremity peripheral edema.  He continues to get wound care and feels that his wound is healing well.  He has had no shortness of breath with lying down associated with his CHF.  He does not currently have a cardiologist.  He does not recall being set up with an appointment to follow-up with cardiology as hospital  discharge.  Patient developed atrial fibrillation with rapid ventricular rate during his hospitalization.  Patient reports no current issues with sensation of increased heart rate or palpitations.  Patient also feels better since receiving blood transfusion during his hospitalization.  He reports that he does have an upcoming appointment with orthopedics for wound recheck.  Past Medical History:  Diagnosis Date  . Diabetes mellitus type 2 in nonobese (HCC)   .  Hypertension     Past Surgical History:  Procedure Laterality Date  . AMPUTATION Right 11/16/2018   Procedure: RIGHT BELOW KNEE AMPUTATION;  Surgeon: Newt Minion, MD;  Location: [facility];  Service: Orthopedics;  Laterality: Right;  . HAND SURGERY      Family History  Problem Relation Age of Onset  . Diabetes Father   . Cancer Neg Hx   . CAD Neg Hx     Social History   Tobacco Use  . Smoking status: Never Smoker  . Smokeless tobacco: Never Used  Substance Use Topics  . Alcohol use: Yes  . Drug use: No     No Known Allergies     Observations/Objective: No vital signs or physical exam conducted as visit was done via telephone   ASSESSMENT AND PLAN: 1. Diabetic polyneuropathy associated with type 2 diabetes mellitus ([place]) 2. Type 2 diabetes mellitus with stage 3 chronic kidney disease, without long-term current use of insulin (HCC) Blood sugars are well controlled with most recent hemoglobin A1c of 6.4 and patient reports that he continues to monitor his blood sugars.  He is to continue glipizide and a healthy diet.  3. PVD (peripheral vascular disease) ([place]) Status post right BKA due to peripheral vascular disease/critical limb ischemia.  He will need 6-month follow-up status post hospitalization with vascular.  Referral will be placed.  4. Chronic diastolic congestive heart failure ([place]); atrial fibrillation with rapid ventricular rate CHF appears to be stable as he reports no issues with orthopnea or air hunger.  No edema of the left leg.  Patient also during recent hospitalization was diagnosed with new onset of atrial fibrillation with rapid ventricular rate for which he was placed on amiodarone.  Patient will be referred to cardiology for ongoing evaluation and treatment of his CHF and atrial fibrillation.  Patient did convert to normal sinus rhythm during hospitalization and was placed on amiodarone.  Continue Lasix and amiodarone.  Patient has been asked to come into the  office to have blood work within the next 2 weeks as he states that he believes he will be discharged from home health during that timeframe.  5. Protein-calorie malnutrition, moderate ([place]) Labs during hospitalization revealed total protein of 6.4 and albumin of 1.8 consistent with protein calorie malnutrition.  Patient states that he is using a whey protein supplement and feels that he has regained weight as well as regain strength.  Patient reports loss of appetite prior to his hospitalization which was likely related to his sepsis.  6. Acquired absence of right lower extremity below knee ([place]); 7.  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 Hospital discharge follow-up Notes were reviewed from patient's hospitalization from 11/13/2018 through 11/20/2018 due to sepsis, acute renal failure, acute blood loss anemia, peripheral vascular disease, infected wound of the right foot and dry gangrene as well as peripheral vascular disease with critical limb ischemia necessitating right BKA.  Patient then stayed in rehabilitation from 11/20/2018 through 12/04/2018 and has been fitted for prosthetic limb.  Patient is attending physical therapy and should receive  his limb prosthesis within the next 1 to 2 weeks.  Patient has been asked to come to the office within the next 2 weeks to have lab work done in follow-up of his recent hospitalization as well as his chronic medical issues.  Patient will have complete metabolic panel in follow-up of diabetes, renal disease and protein calorie malnutrition.  CBC in follow-up of anemia and lipid panel due to his peripheral vascular disease and diabetes.  Patient is being referred to cardiology for further evaluation and continued treatment/management of his CHF which is currently stable and patient also had new onset of atrial fibrillation with rapid ventricular rate  during his hospitalization which converted spontaneously and for which patient is currently on amiodarone.   Referral to vascular surgery as per hospital discharge for 1546-month follow-up.   Future Appointments  Date Time Provider Department Center  01/22/2019  1:15 PM Adonis HugueninZamora, Erin R, NP [facility] None  02/15/2019 12:00 PM [facility] 3 - EM [facility]  02/15/2019  1:15 PM Nickel, Carma LairSuzanne L, NP [facility]     Follow Up Instructions:Return in about 2 months (around 03/06/2019) for Chronic medical issues-lab visit in 2 weeks and 6570-month follow-up, sooner if needed.    I discussed the assessment and treatment plan with the patient. The patient was provided an opportunity to ask questions and all were answered. The patient agreed with the plan and demonstrated an understanding of the instructions.   The patient was advised to call back or seek an in-person evaluation if the symptoms worsen or if the condition fails to improve as anticipated.  I provided 14 minutes of non-face-to-face time during this encounter.   Cain Saupeammie Chanell Nadeau, MD

## 2019-01-08 ENCOUNTER — Other Ambulatory Visit: Payer: Self-pay

## 2019-01-08 ENCOUNTER — Ambulatory Visit (INDEPENDENT_AMBULATORY_CARE_PROVIDER_SITE_OTHER): Payer: Medicare Other | Admitting: Family

## 2019-01-08 ENCOUNTER — Encounter: Payer: Self-pay | Admitting: Family

## 2019-01-08 DIAGNOSIS — Z89511 Acquired absence of right leg below knee: Secondary | ICD-10-CM

## 2019-01-08 DIAGNOSIS — E1142 Type 2 diabetes mellitus with diabetic polyneuropathy: Secondary | ICD-10-CM

## 2019-01-08 DIAGNOSIS — M869 Osteomyelitis, unspecified: Secondary | ICD-10-CM | POA: Diagnosis not present

## 2019-01-08 DIAGNOSIS — A419 Sepsis, unspecified organism: Secondary | ICD-10-CM | POA: Diagnosis not present

## 2019-01-08 DIAGNOSIS — Z4781 Encounter for orthopedic aftercare following surgical amputation: Secondary | ICD-10-CM | POA: Diagnosis not present

## 2019-01-08 DIAGNOSIS — E1169 Type 2 diabetes mellitus with other specified complication: Secondary | ICD-10-CM | POA: Diagnosis not present

## 2019-01-08 DIAGNOSIS — E1152 Type 2 diabetes mellitus with diabetic peripheral angiopathy with gangrene: Secondary | ICD-10-CM | POA: Diagnosis not present

## 2019-01-08 DIAGNOSIS — I4891 Unspecified atrial fibrillation: Secondary | ICD-10-CM | POA: Diagnosis not present

## 2019-01-08 MED ORDER — OXYCODONE HCL 5 MG PO TABS: 5.0000 mg | ORAL_TABLET | Freq: Four times a day (QID) | ORAL | 0 refills | Status: DC | PRN

## 2019-01-08 NOTE — Progress Notes (Signed)
Post-Op Visit Note   Patient: Joseph [name]           Date of Birth: Apr 01, 1946           MRN: [id number] Visit Date: 01/08/2019 PCP: Patient, No Pcp Per  Chief Complaint: No chief complaint on file.   HPI:  HPI The patient is a 73 year old gentleman who presents today status post right below the knee amputation.  Staples harvested at last visit.  He does have ischemic changes along this incision.  He also reports no ulcer to his left heel thinks this is from using his heel to scoot around in his wheelchair. Ortho Exam On examination of the right below the knee amputation this is consolidating well there is no erythema.  He does however have ischemic changes along the incision primarily centrally.  He has an [place] of eschar that is 5 cm x 1 cm.  There is no drainage or surrounding maceration.  There are 2 remaining staples that we will harvest today.  This was debrided of skin and soft tissue nonviable tissue back to viable tissue.  Patient tolerated well on examination of the left lower extremity there is no edema or erythema he does have an ulceration with some surrounding peeling macerated tissue.  The ulcer is 1 cm in diameter there is no depth that is flat and pink.  No odor.  Visit Diagnoses:  1. Diabetic polyneuropathy associated with type 2 diabetes mellitus (HCC)   2. 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HCC)   3. Acquired absence of right lower extremity below knee [facility](HCC)     Plan: He will do daily Dial soap cleansing of both wounds continue with medical compression shrinker with direct skin contact every other day.  He will wear a Iodosorb dressing on the other every other days.  To the left heel can do dry dressing changes daily minimize pressure on the heel.  Follow-Up Instructions: No follow-ups on file.   Imaging: No results found.  Orders:  No orders of the defined types were placed in this encounter.  Meds ordered this encounter  Medications  . oxyCODONE (OXY IR/ROXICODONE) 5  MG immediate release tablet    Sig: Take 1 tablet (5 mg total) by mouth every 6 (six) hours as needed for severe pain (pain score 7-10).    Dispense:  20 tablet    Refill:  0     PMFS History: Patient Active Problem List   Diagnosis Date Noted  . Postoperative pain   . AKI (acute kidney injury) (HCC)   . Acute blood loss anemia   . Chronic diastolic congestive heart failure (HCC)   . Diabetic peripheral neuropathy (HCC)   . CKD (chronic kidney disease), stage II   . 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 Leukocytosis   . 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 Atrial fibrillation (HCC)   . Diabetic polyneuropathy associated with type 2 diabetes mellitus (HCC)   . PVD (peripheral vascular disease) (HCC)   . Critical lower limb ischemia   . Sepsis with acute renal failure (HCC) 11/13/2018  . Acute renal failure superimposed on stage 2 chronic kidney disease (HCC)   . 'light-for-dates' infant with signs of fetal malnutrition 10/17/2018  . DM (diabetes mellitus) (HCC) 07/02/2012  . Hypertension associated with diabetes (HCC) 07/02/2012   Past Medical History:  Diagnosis Date  . Cellulitis of right lower extremity   . Diabetes mellitus type 2 in nonobese (HCC)   . Gangrene of right  foot ([place])   . Hypertension   . Open wound of right foot     Family History  Problem Relation Age of Onset  . Diabetes Father   . Cancer Neg Hx   . CAD Neg Hx     Past Surgical History:  Procedure Laterality Date  . AMPUTATION Right 11/16/2018   Procedure: RIGHT BELOW KNEE AMPUTATION;  Surgeon: Newt Minion, MD;  Location: [facility];  Service: Orthopedics;  Laterality: Right;  . HAND SURGERY     Social History   Occupational History  . Not on file  Tobacco Use  . Smoking status: Never Smoker  . Smokeless tobacco: Never Used  Substance and Sexual Activity  . Alcohol use: Yes  . Drug use: No  . Sexual activity: Not on file

## 2019-01-09 DIAGNOSIS — Z4781 Encounter for orthopedic aftercare following surgical amputation: Secondary | ICD-10-CM | POA: Diagnosis not present

## 2019-01-09 DIAGNOSIS — M869 Osteomyelitis, unspecified: Secondary | ICD-10-CM | POA: Diagnosis not present

## 2019-01-09 DIAGNOSIS — I4891 Unspecified atrial fibrillation: Secondary | ICD-10-CM | POA: Diagnosis not present

## 2019-01-09 DIAGNOSIS — A419 Sepsis, unspecified organism: Secondary | ICD-10-CM | POA: Diagnosis not present

## 2019-01-09 DIAGNOSIS — E1152 Type 2 diabetes mellitus with diabetic peripheral angiopathy with gangrene: Secondary | ICD-10-CM | POA: Diagnosis not present

## 2019-01-09 DIAGNOSIS — E1169 Type 2 diabetes mellitus with other specified complication: Secondary | ICD-10-CM | POA: Diagnosis not present

## 2019-01-11 ENCOUNTER — Encounter: Payer: Self-pay | Admitting: Family Medicine

## 2019-01-22 ENCOUNTER — Ambulatory Visit (INDEPENDENT_AMBULATORY_CARE_PROVIDER_SITE_OTHER): Payer: Medicare Other | Admitting: Family

## 2019-01-22 ENCOUNTER — Encounter: Payer: Self-pay | Admitting: Family

## 2019-01-22 ENCOUNTER — Other Ambulatory Visit: Payer: Self-pay

## 2019-01-22 VITALS — Ht 76.0 in | Wt 189.0 lb

## 2019-01-22 DIAGNOSIS — Z89511 Acquired absence of right leg below knee: Secondary | ICD-10-CM

## 2019-01-22 DIAGNOSIS — E1142 Type 2 diabetes mellitus with diabetic polyneuropathy: Secondary | ICD-10-CM

## 2019-01-22 MED ORDER — OXYCODONE HCL 5 MG PO TABS: 5.0000 mg | ORAL_TABLET | Freq: Four times a day (QID) | ORAL | 0 refills | Status: DC | PRN

## 2019-01-22 NOTE — Progress Notes (Signed)
Office Visit Note   Patient: Joseph [name]           Date of Birth: 1945-06-19           MRN: [id number] Visit Date: 01/22/2019              Requested by: No referring provider defined for this encounter. PCP: Patient, No Pcp Per  Chief Complaint  Patient presents with  . Right Leg - Routine Post Op    11/16/18 right BKA      HPI: Patient is a 73 year old gentleman who presents 2 months status post right transtibial amputation.  He is working with [employer] he has a stump shrinker and a limb protector.  He feels well denies any problems.  Assessment & Plan: Visit Diagnoses:  1. Diabetic polyneuropathy associated with type 2 diabetes mellitus (HCC)   2. Right below-knee amputee [facility](HCC)     Plan: Patient will continue the stump shrinker a limb protector continue with protein supplements.  Follow-Up Instructions: Return in about 4 weeks (around 02/19/2019).   Ortho Exam  Patient is alert, oriented, no adenopathy, well-dressed, normal affect, normal respiratory effort. Examination the incision is healing well there is no redness no cellulitis minimal swelling no signs of infection he does have a eschar scab which is stable.  Imaging: No results found.   Labs: Lab Results  Component Value Date   HGBA1C 6.4 (H) 11/18/2018   HGBA1C 5.7 10/17/2018   HGBA1C 9.1 09/28/2013   REPTSTATUS 11/18/2018 FINAL 11/13/2018   CULT  11/13/2018    NO GROWTH 5 DAYS Performed at [facility], [address] [place], [place] [address]      Lab Results  Component Value Date   ALBUMIN 1.8 (L) 11/21/2018   ALBUMIN 2.2 (L) 11/15/2018   ALBUMIN 2.6 (L) 11/13/2018    Lab Results  Component Value Date   MG 2.0 11/15/2018   No results found for: VD25OH  No results found for: PREALBUMIN CBC EXTENDED Latest Ref Rng & Units 12/03/2018 11/30/2018 11/26/2018  WBC 4.0 - 10.5 K/uL 9.2 8.5 10.0  RBC 4.22 - 5.81 MIL/uL 3.28(L) 2.83(L) 3.01(L)  HGB 13.0 - 17.0 g/dL 1.9(J9.6(L) 8.3(L) 8.8(L)  HCT  39.0 - 52.0 % 30.4(L) 25.8(L) 28.1(L)  PLT 150 - 400 K/uL 394 387 470(H)  NEUTROABS 1.7 - 7.7 K/uL 4.9 3.6 5.7  LYMPHSABS 0.7 - 4.0 K/uL 3.3 3.9 3.6     Body mass index is 23.01 kg/m.  Orders:  No orders of the defined types were placed in this encounter.  No orders of the defined types were placed in this encounter.    Procedures: No procedures performed  Clinical Data: No additional findings.  ROS:  All other systems negative, except as noted in the HPI. Review of Systems  Objective: Vital Signs: Ht 6\' 4"  (1.93 m)   Wt 189 lb (85.7 kg)   BMI 23.01 kg/m   Specialty Comments:  No specialty comments available.  PMFS History: Patient Active Problem List   Diagnosis Date Noted  . Postoperative pain   . AKI (acute kidney injury) (HCC)   . Acute blood loss anemia   . Chronic diastolic congestive heart failure (HCC)   . Diabetic peripheral neuropathy (HCC)   . CKD (chronic kidney disease), stage II   . 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 Leukocytosis   . 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 Atrial fibrillation (HCC)   . Diabetic polyneuropathy associated with type  2 diabetes mellitus ([place])   . PVD (peripheral vascular disease) ([place])   . Critical lower limb ischemia   . Sepsis with acute renal failure ([place]) 11/13/2018  . Acute renal failure superimposed on stage 2 chronic kidney disease ([place])   . 'light-for-dates' infant with signs of fetal malnutrition 10/17/2018  . DM (diabetes mellitus) ([place]) 07/02/2012  . Hypertension associated with diabetes ([place]) 07/02/2012   Past Medical History:  Diagnosis Date  . Cellulitis of right lower extremity   . Diabetes mellitus type 2 in nonobese (HCC)   . Gangrene of right foot ([place])   . Hypertension   . Open wound of right foot     Family History  Problem Relation Age of Onset  . Diabetes Father   . Cancer Neg Hx   . CAD Neg Hx     Past Surgical History:  Procedure Laterality Date   . AMPUTATION Right 11/16/2018   Procedure: RIGHT BELOW KNEE AMPUTATION;  Surgeon: Newt Minion, MD;  Location: [place];  Service: Orthopedics;  Laterality: Right;  . HAND SURGERY     Social History   Occupational History  . Not on file  Tobacco Use  . Smoking status: Never Smoker  . Smokeless tobacco: Never Used  Substance and Sexual Activity  . Alcohol use: Yes  . Drug use: No  . Sexual activity: Not on file

## 2019-01-24 ENCOUNTER — Other Ambulatory Visit: Payer: Self-pay

## 2019-01-24 DIAGNOSIS — Z89511 Acquired absence of right leg below knee: Secondary | ICD-10-CM

## 2019-02-08 MED FILL — FUROSEMIDE 40 MG TAB: 40 | 30 days supply | Qty: 30 | Fill #1

## 2019-02-08 MED FILL — POTASSIUM CL ER 20 MEQ TAB: 20 | 30 days supply | Qty: 30 | Fill #1

## 2019-02-08 MED FILL — glipiZIDE 5 MG TABS: 5 | 30 days supply | Qty: 30 | Fill #1

## 2019-02-13 ENCOUNTER — Ambulatory Visit: Payer: Medicare Other | Admitting: Physical Therapy

## 2019-02-14 ENCOUNTER — Other Ambulatory Visit: Payer: Self-pay

## 2019-02-14 DIAGNOSIS — I739 Peripheral vascular disease, unspecified: Secondary | ICD-10-CM

## 2019-02-15 ENCOUNTER — Ambulatory Visit: Payer: Medicare Other | Admitting: Family

## 2019-02-15 ENCOUNTER — Ambulatory Visit (HOSPITAL_COMMUNITY): Payer: Medicare Other | Attending: Vascular Surgery

## 2019-02-19 ENCOUNTER — Ambulatory Visit (INDEPENDENT_AMBULATORY_CARE_PROVIDER_SITE_OTHER): Payer: Medicare Other | Admitting: Family

## 2019-02-19 ENCOUNTER — Encounter: Payer: Self-pay | Admitting: Family

## 2019-02-19 ENCOUNTER — Telehealth: Payer: Self-pay | Admitting: Family

## 2019-02-19 DIAGNOSIS — Z89511 Acquired absence of right leg below knee: Secondary | ICD-10-CM

## 2019-02-19 MED ORDER — OXYCODONE HCL 5 MG PO TABS: 5.0000 mg | ORAL_TABLET | Freq: Four times a day (QID) | ORAL | 0 refills | Status: DC | PRN

## 2019-02-19 NOTE — Telephone Encounter (Signed)
[facility] called to advise that they do not dispense the Oxycodone at their pharmacy.  This medication will have to be sent to another pharmacy.  Thank you.

## 2019-02-19 NOTE — Progress Notes (Signed)
Office Visit Note   Patient: Joseph [name]           Date of Birth: Jan 17, 1946           MRN: [id number] Visit Date: 02/19/2019              Requested by: No referring provider defined for this encounter. PCP: Patient, No Pcp Per  Chief Complaint  Patient presents with  . Right Leg - Routine Post Op    11/16/2018 right BKA      HPI: Patient is a 73 year old gentleman who presents 3 months status post right transtibial amputation.  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   Has received his prosthesis and begun walking on it.  States he is only been doing it about an hour a day however he did overdo it and now has a blister to the end of his limb.   Assessment & Plan: Visit Diagnoses:  1. Acquired absence of right lower extremity below knee [facility])     Plan: Patient will continue the stump shrinker.  Begin daily dressing changes following Dial soap cleansing.  Has been given a tube of Iodosorb he will use this for his dressing changes daily.  Advised to hold off on gait training until incision has fully healed.  Follow-Up Instructions: Return in about 4 weeks (around 03/19/2019).   Ortho Exam  Patient is alert, oriented, no adenopathy, well-dressed, normal affect, normal respiratory effort. Examination the incision is healing slowly there is no redness no cellulitis minimal swelling no signs of infection he does have a eschar scab which is stable.  This has healed in about 3 mm circumferentially since last viewing.  Imaging: No results found.   Labs: Lab Results  Component Value Date   HGBA1C 6.4 (H) 11/18/2018   HGBA1C 5.7 10/17/2018   HGBA1C 9.1 09/28/2013   REPTSTATUS 11/18/2018 FINAL 11/13/2018   CULT  11/13/2018    NO GROWTH 5 DAYS Performed at [facility], [address] [place], [place] [address]      Lab Results  Component Value Date   ALBUMIN 1.8 (L) 11/21/2018   ALBUMIN 2.2 (L) 11/15/2018   ALBUMIN 2.6 (L)  11/13/2018    Lab Results  Component Value Date   MG 2.0 11/15/2018   No results found for: VD25OH  No results found for: PREALBUMIN CBC EXTENDED Latest Ref Rng & Units 12/03/2018 11/30/2018 11/26/2018  WBC 4.0 - 10.5 K/uL 9.2 8.5 10.0  RBC 4.22 - 5.81 MIL/uL 3.28(L) 2.83(L) 3.01(L)  HGB 13.0 - 17.0 g/dL 3.4(V) 8.3(L) 8.8(L)  HCT 39.0 - 52.0 % 30.4(L) 25.8(L) 28.1(L)  PLT 150 - 400 K/uL 394 387 470(H)  NEUTROABS 1.7 - 7.7 K/uL 4.9 3.6 5.7  LYMPHSABS 0.7 - 4.0 K/uL 3.3 3.9 3.6     Body mass index is 23.01 kg/m.  Orders:  No orders of the defined types were placed in this encounter.  Meds ordered this encounter  Medications  . oxyCODONE (OXY IR/ROXICODONE) 5 MG immediate release tablet    Sig: Take 1 tablet (5 mg total) by mouth every 6 (six) hours as needed for severe pain (pain score 7-10).    Dispense:  28 tablet    Refill:  0     Procedures: No procedures performed  Clinical Data: No additional findings.  ROS:  All other systems negative, except as noted in the HPI. Review of Systems  Constitutional: Negative for chills and  fever.  Cardiovascular: Positive for leg swelling.  Skin: Positive for wound. Negative for color change.    Objective: Vital Signs: Ht 6\' 4"  (1.93 m)   Wt 189 lb (85.7 kg)   BMI 23.01 kg/m   Specialty Comments:  No specialty comments available.  PMFS History: Patient Active Problem List   Diagnosis Date Noted  . Postoperative pain   . AKI (acute kidney injury) ([place])   . Acute blood loss anemia   . Chronic diastolic congestive heart failure ([place])   . Diabetic peripheral neuropathy ([place])   . CKD (chronic kidney disease), stage II   . Hypoalbuminemia due to protein-calorie malnutrition ([place])   . Leukocytosis   . 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[place]) 11/20/2018  . Atrial fibrillation ([place])   . Diabetic polyneuropathy associated with type 2 diabetes mellitus ([place])   . PVD (peripheral vascular disease) ([place])   . Critical lower limb  ischemia   . Sepsis with acute renal failure ([place]) 11/13/2018  . Acute renal failure superimposed on stage 2 chronic kidney disease ([place])   . 'light-for-dates' infant with signs of fetal malnutrition 10/17/2018  . DM (diabetes mellitus) ([place]) 07/02/2012  . Hypertension associated with diabetes ([place]) 07/02/2012   Past Medical History:  Diagnosis Date  . Cellulitis of right lower extremity   . Diabetes mellitus type 2 in nonobese (HCC)   . Gangrene of right foot ([place])   . Hypertension   . Open wound of right foot     Family History  Problem Relation Age of Onset  . Diabetes Father   . Cancer Neg Hx   . CAD Neg Hx     Past Surgical History:  Procedure Laterality Date  . AMPUTATION Right 11/16/2018   Procedure: RIGHT BELOW KNEE AMPUTATION;  Surgeon: Newt Minion, MD;  Location: [facility] [place];  Service: Orthopedics;  Laterality: Right;  . HAND SURGERY     Social History   Occupational History  . Not on file  Tobacco Use  . Smoking status: Never Smoker  . Smokeless tobacco: Never Used  Substance and Sexual Activity  . Alcohol use: Yes  . Drug use: No  . Sexual activity: Not on file

## 2019-02-19 NOTE — Telephone Encounter (Signed)
Please see message below. Pharmacy has been changed in system to [facility].  I attempted to reach patient to advise, but no answer.

## 2019-02-28 ENCOUNTER — Ambulatory Visit: Payer: Medicare Other | Admitting: Physical Therapy

## 2019-03-19 ENCOUNTER — Ambulatory Visit (INDEPENDENT_AMBULATORY_CARE_PROVIDER_SITE_OTHER): Payer: Medicare Other | Admitting: Family

## 2019-03-19 ENCOUNTER — Encounter: Payer: Self-pay | Admitting: Family

## 2019-03-19 ENCOUNTER — Other Ambulatory Visit: Payer: Self-pay | Admitting: Physician Assistant

## 2019-03-19 ENCOUNTER — Other Ambulatory Visit: Payer: Self-pay

## 2019-03-19 DIAGNOSIS — Z89511 Acquired absence of right leg below knee: Secondary | ICD-10-CM

## 2019-03-19 MED ORDER — OXYCODONE HCL 5 MG PO TABS: 5.0000 mg | ORAL_TABLET | Freq: Two times a day (BID) | ORAL | 0 refills | Status: DC | PRN

## 2019-03-19 MED FILL — AMIODARONE HCL 200 MG TAB: 200 | 30 days supply | Qty: 60 | Fill #0

## 2019-03-19 MED FILL — glipiZIDE 5 MG TABS: 5 | 30 days supply | Qty: 30 | Fill #2

## 2019-03-19 MED FILL — PANTOPRAZOLE SOD DR 40 MG T: 40 | 30 days supply | Qty: 60 | Fill #1

## 2019-03-19 MED FILL — FUROSEMIDE 40 MG TAB: 40 | 30 days supply | Qty: 30 | Fill #1

## 2019-03-19 MED FILL — GABAPENTIN 300 MG CAPSULE: 300 | 30 days supply | Qty: 90 | Fill #1

## 2019-03-19 NOTE — Progress Notes (Signed)
Post-Op Visit Note   Patient: Joseph [name]           Date of Birth: 1946-03-22           MRN: [id number] Visit Date: 03/19/2019 PCP: Patient, No Pcp Per  Chief Complaint:  Chief Complaint  Patient presents with  . Right Leg - Follow-up    11/16/2018 right BKA    HPI:  HPI Mr. Windy Kalata is a 73 year old gentleman who presents today status post right below the knee amputation June 19 of this year.  He continues to have so drainage she has a scabbed area on the lateral side of his stump.  He would like to discuss all of his medications again today.  He is having mild pain especially after dressing changes or with weightbearing.  He does have his prosthetic at home he is using this with a walker but states he is trying to keep weight off of his residual limb.  Ortho Exam On examination of the right residual limb there is a 6 cm x 2 cm ulcer laterally this is 60% filled in with granulation tissue remaining wound covered with fibrinous tissue there is no surrounding erythema no active drainage no sign of infection  Visit Diagnoses: No diagnosis found.  Plan: We will continue with Iodosorb dressing changes daily.  Discussed importance of nonweightbearing in his prosthetic until this is healed.  He will follow up in 4 weeks.  Did provide a last refill of his oxycodone.  Follow-Up Instructions: No follow-ups on file.   Imaging: No results found.  Orders:  No orders of the defined types were placed in this encounter.  No orders of the defined types were placed in this encounter.    PMFS History: Patient Active Problem List   Diagnosis Date Noted  . Postoperative pain   . AKI (acute kidney injury) (HCC)   . Acute blood loss anemia   . Chronic diastolic congestive heart failure (HCC)   . Diabetic peripheral neuropathy (HCC)   . CKD (chronic kidney disease), stage II   . 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 Leukocytosis   . 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HCC)  11/20/2018  . Atrial fibrillation (HCC)   . Diabetic polyneuropathy associated with type 2 diabetes mellitus (HCC)   . PVD (peripheral vascular disease) (HCC)   . Critical lower limb ischemia   . Sepsis with acute renal failure (HCC) 11/13/2018  . Acute renal failure superimposed on stage 2 chronic kidney disease (HCC)   . 'light-for-dates' infant with signs of fetal malnutrition 10/17/2018  . DM (diabetes mellitus) (HCC) 07/02/2012  . Hypertension associated with diabetes (HCC) 07/02/2012   Past Medical History:  Diagnosis Date  . Cellulitis of right lower extremity   . Diabetes mellitus type 2 in nonobese (HCC)   . Gangrene of right foot (HCC)   . Hypertension   . Open wound of right foot     Family History  Problem Relation Age of Onset  . Diabetes Father   . Cancer Neg Hx   . CAD Neg Hx     Past Surgical History:  Procedure Laterality Date  . AMPUTATION Right 11/16/2018   Procedure: RIGHT BELOW KNEE AMPUTATION;  Surgeon: [name], MD;  Location[facility];  Service: Orthopedics;  Laterality: Right;  . HAND SURGERY     Social History   Occupational History  . Not on file  Tobacco Use  . Smoking status: Never Smoker  . Smokeless tobacco: Never  Used  Substance and Sexual Activity  . Alcohol use: Yes  . Drug use: No  . Sexual activity: Not on file

## 2019-04-19 ENCOUNTER — Ambulatory Visit (INDEPENDENT_AMBULATORY_CARE_PROVIDER_SITE_OTHER): Payer: Medicare Other | Admitting: Family

## 2019-04-19 ENCOUNTER — Other Ambulatory Visit: Payer: Self-pay

## 2019-04-19 ENCOUNTER — Encounter: Payer: Self-pay | Admitting: Family

## 2019-04-19 DIAGNOSIS — Z89511 Acquired absence of right leg below knee: Secondary | ICD-10-CM

## 2019-04-19 NOTE — Progress Notes (Signed)
   Post-Op Visit Note   Patient: Joseph [name]           Date of Birth: [birth year]           MRN: [id number] Visit Date: 04/19/2019 PCP: Patient, No Pcp Per  Chief Complaint:  No chief complaint on file.   HPI:  HPI Joseph [name] is a 73 year old gentleman who presents today status post right below the knee amputation June 19 of this year.  States he has his prosthesis now but is not using until his wound heals.  Ortho Exam On examination of the right residual limb there is a 6 cm x 18 mm ulcer laterally this is 100% filled in with granulation tissue remaining wound covered with fibrinous tissue there is no surrounding erythema no active drainage no sign of infection  Visit Diagnoses:  1. 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 Wear shrinker.  Discussed importance of nonweightbearing in his prosthetic until this is healed.  He will follow up in 4 weeks.    Follow-Up Instructions: Return in about 4 weeks (around 05/17/2019).   Imaging: No results found.  Orders:  No orders of the defined types were placed in this encounter.  No orders of the defined types were placed in this encounter.    PMFS History: Patient Active Problem List   Diagnosis Date Noted  . Postoperative pain   . AKI (acute kidney injury) ([place])   . Acute blood loss anemia   . Chronic diastolic congestive heart failure ([place])   . Diabetic peripheral neuropathy ([place])   . CKD (chronic kidney disease), stage II   . 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 Leukocytosis   . 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[place]) 11/20/2018  . Atrial fibrillation ([place])   . Diabetic polyneuropathy associated with type 2 diabetes mellitus ([place])   . PVD (peripheral vascular disease) ([place])   . Critical lower limb ischemia   . Sepsis with acute renal failure ([place]) 11/13/2018  . Acute renal failure superimposed on stage 2 chronic kidney disease ([place])   .  'light-for-dates' infant with signs of fetal malnutrition 10/17/2018  . DM (diabetes mellitus) ([place]) 07/02/2012  . Hypertension associated with diabetes ([place]) 07/02/2012   Past Medical History:  Diagnosis Date  . Cellulitis of right lower extremity   . Diabetes mellitus type 2 in nonobese (HCC)   . Gangrene of right foot ([place])   . Hypertension   . Open wound of right foot     Family History  Problem Relation Age of Onset  . Diabetes Father   . Cancer Neg Hx   . CAD Neg Hx     Past Surgical History:  Procedure Laterality Date  . AMPUTATION Right 11/16/2018   Procedure: RIGHT BELOW KNEE AMPUTATION;  Surgeon: Newt Minion, MD;  Location: [facility];  Service: Orthopedics;  Laterality: Right;  . HAND SURGERY     Social History   Occupational History  . Not on file  Tobacco Use  . Smoking status: Never Smoker  . Smokeless tobacco: Never Used  Substance and Sexual Activity  . Alcohol use: Yes  . Drug use: No  . Sexual activity: Not on file

## 2019-04-24 MED FILL — GABAPENTIN 300 MG CAPSULE: 300 | 30 days supply | Qty: 90 | Fill #1

## 2019-04-24 MED FILL — glipiZIDE 5 MG TABS: 5 | 30 days supply | Qty: 30 | Fill #1

## 2019-04-24 MED FILL — FUROSEMIDE 40 MG TAB: 40 | 30 days supply | Qty: 30 | Fill #0

## 2019-04-24 MED FILL — PANTOPRAZOLE SOD DR 40 MG T: 40 | 30 days supply | Qty: 60 | Fill #1

## 2019-04-24 MED FILL — AMIODARONE HCL 200 MG TAB: 200 | 30 days supply | Qty: 60 | Fill #1

## 2019-05-06 ENCOUNTER — Telehealth: Payer: Self-pay | Admitting: Medical

## 2019-05-06 ENCOUNTER — Encounter: Payer: Self-pay | Admitting: Internal Medicine

## 2019-05-06 ENCOUNTER — Ambulatory Visit (INDEPENDENT_AMBULATORY_CARE_PROVIDER_SITE_OTHER): Payer: Medicare Other | Admitting: Internal Medicine

## 2019-05-06 ENCOUNTER — Other Ambulatory Visit: Payer: Self-pay

## 2019-05-06 VITALS — BP 146/70 | HR 63 | Ht 75.0 in | Wt 213.4 lb

## 2019-05-06 DIAGNOSIS — I5032 Chronic diastolic (congestive) heart failure: Secondary | ICD-10-CM | POA: Diagnosis not present

## 2019-05-06 DIAGNOSIS — I480 Paroxysmal atrial fibrillation: Secondary | ICD-10-CM

## 2019-05-06 DIAGNOSIS — E1169 Type 2 diabetes mellitus with other specified complication: Secondary | ICD-10-CM

## 2019-05-06 LAB — COMPREHENSIVE METABOLIC PANEL
ALT: 9 IU/L (ref 0–44)
AST: 13 IU/L (ref 0–40)
Albumin/Globulin Ratio: 1.2 (ref 1.2–2.2)
Albumin: 4.2 g/dL (ref 3.7–4.7)
Alkaline Phosphatase: 68 IU/L (ref 39–117)
BUN/Creatinine Ratio: 14 (ref 10–24)
BUN: 30 mg/dL — ABNORMAL HIGH (ref 8–27)
Bilirubin Total: 0.3 mg/dL (ref 0.0–1.2)
CO2: 25 mmol/L (ref 20–29)
Calcium: 10 mg/dL (ref 8.6–10.2)
Chloride: 91 mmol/L — ABNORMAL LOW (ref 96–106)
Creatinine, Ser: 2.11 mg/dL — ABNORMAL HIGH (ref 0.76–1.27)
GFR calc Af Amer: 35 mL/min/{1.73_m2} — ABNORMAL LOW (ref >59)
GFR calc non Af Amer: 31 mL/min/{1.73_m2} — ABNORMAL LOW (ref >59)
Globulin, Total: 3.4 g/dL (ref 1.5–4.5)
Glucose: 526 mg/dL (ref 65–99)
Potassium: 5.1 mmol/L (ref 3.5–5.2)
Sodium: 133 mmol/L — ABNORMAL LOW (ref 134–144)
Total Protein: 7.6 g/dL (ref 6.0–8.5)

## 2019-05-06 LAB — LIPID PANEL
Chol/HDL Ratio: 2.6 ratio (ref 0.0–5.0)
Cholesterol, Total: 125 mg/dL (ref 100–199)
HDL: 49 mg/dL (ref >39)
LDL Chol Calc (NIH): 55 mg/dL (ref 0–99)
Triglycerides: 114 mg/dL (ref 0–149)
VLDL Cholesterol Cal: 21 mg/dL (ref 5–40)

## 2019-05-06 LAB — CBC
Hematocrit: 35.8 % — ABNORMAL LOW (ref 37.5–51.0)
Hemoglobin: 11.6 g/dL — ABNORMAL LOW (ref 13.0–17.7)
MCH: 29.7 pg (ref 26.6–33.0)
MCHC: 32.4 g/dL (ref 31.5–35.7)
MCV: 92 fL (ref 79–97)
Platelets: 306 10*3/uL (ref 150–450)
RBC: 3.91 x10E6/uL — ABNORMAL LOW (ref 4.14–5.80)
RDW: 13 % (ref 11.6–15.4)
WBC: 9.4 10*3/uL (ref 3.4–10.8)

## 2019-05-06 LAB — HEMOGLOBIN A1C
Est. average glucose Bld gHb Est-mCnc: 278 mg/dL
Hgb A1c MFr Bld: 11.3 % — ABNORMAL HIGH (ref 4.8–5.6)

## 2019-05-06 LAB — TSH: TSH: 2.6 u[IU]/mL (ref 0.450–4.500)

## 2019-05-06 NOTE — Telephone Encounter (Signed)
Critical Lab value was called in this afternoon, Glucose 526. I called the patient and he said he ate before getting his labs drawn. His BG this morning was 140. He takes glipizide daily. He denied any chest pain, sob, lightheadedness, dizziness, or other symptoms. Recommended he follow-up with his PCP for further diabetic management.   Kelisha Dall Kathlen Mody, PA-C

## 2019-05-06 NOTE — Progress Notes (Signed)
Cardiology Office Note   Date:  05/06/2019   ID:  Joseph [name], DOB 10/16/1945, MRN [id number]  PCP:  Patient, No Pcp Per  Cardiologist:   Dorris Carnes, MD   Pt presents for f/u of PAF   History of Present Illness: Joseph [name] is a 73 y.o. male with a history of DM, diastolic CHF, CKD.  He was admitted in June 2020 with ischemia to foot  I saw him when he first came in as a consult    He underwent amputation of R foot on 11/16/18  Post op developed anemia  Hgb 6.7   GI contacted   Pt refused colonoscopy  He also developed episodes of  atrial fibrillation  Seen by cardiology   Converted to SR   Echo LEF 55% with hypokinesis of anteroseptum   Placed on amiodarone 200 bid   Patient sent t orehab  The pt denies palpitations   Breathing is OK   No CP   No dizziness        Current Meds  Medication Sig  . acetaminophen (TYLENOL) 325 MG tablet Take 1-2 tablets (325-650 mg total) by mouth every 6 (six) hours as needed for mild pain (pain score 1-3 or temp > 100.5).  Marland Kitchen amiodarone (PACERONE) 200 MG tablet Take 1 tablet (200 mg total) by mouth daily.  . diclofenac sodium (VOLTAREN) 1 % GEL Apply 2 g topically 3 (three) times daily.  . furosemide (LASIX) 40 MG tablet Take 1 tablet (40 mg total) by mouth daily.  Marland Kitchen gabapentin (NEURONTIN) 300 MG capsule Take 1 capsule (300 mg total) by mouth 3 (three) times daily.  Marland Kitchen glipiZIDE (GLUCOTROL) 5 MG tablet Take 1 tablet (5 mg total) by mouth daily before breakfast.  . Multiple Vitamins-Minerals (ONE-A-DAY MENS 50+ ADVANTAGE) TABS Take 1 tablet by mouth daily with breakfast.  . oxyCODONE (OXY IR/ROXICODONE) 5 MG immediate release tablet Take 1 tablet (5 mg total) by mouth 2 (two) times daily as needed for severe pain (pain score 7-10).  . polyethylene glycol (MIRALAX / GLYCOLAX) 17 g packet Take 17 g by mouth daily as needed for mild constipation.     Allergies:   Patient has no known allergies.   Past Medical History:  Diagnosis Date  . Cellulitis  of right lower extremity   . Diabetes mellitus type 2 in nonobese (HCC)   . Gangrene of right foot ([place])   . Hypertension   . Open wound of right foot     Past Surgical History:  Procedure Laterality Date  . AMPUTATION Right 11/16/2018   Procedure: RIGHT BELOW KNEE AMPUTATION;  Surgeon: Newt Minion, MD;  Location: [facility];  Service: Orthopedics;  Laterality: Right;  . HAND SURGERY       Social History:  The patient  reports that he has never smoked. He has never used smokeless tobacco. He reports current alcohol use. He reports that he does not use drugs.   Family History:  The patient's family history includes Diabetes in his father.    ROS:  Please see the history of present illness. All other systems are reviewed and  Negative to the above problem except as noted.    PHYSICAL EXAM: VS:  BP (!) 146/70   Pulse 63   Ht 6\' 3"  (1.905 m)   Wt 213 lb 6.4 oz (96.8 kg)   BMI 26.67 kg/m   GEN: 73 yo male appearing older than stated age who is in no acute distress  HEENT:  normal  Neck: no JVD, carotid bruits, or masses Cardiac: RRR; no murmurs, rubs, or gallops,no edema  Respiratory:  clear to auscultation bilaterally, normal work of breathing GI: soft, nontender, nondistended, + BS  No hepatomegaly  MS: s/p   Transtibial amputation   Skin: warm and dry, no rash Neuro:  Strength and sensation are intact Psych: euthymic mood, full affect   EKG:  EKG is ordered today.  SR 63 bpm   Nonspecific ST changes       Lipid Panel    Component Value Date/Time   CHOL 61 11/14/2018 0006   TRIG 44 11/14/2018 0006   HDL 13 (L) 11/14/2018 0006   CHOLHDL 4.7 11/14/2018 0006   VLDL 9 11/14/2018 0006   LDLCALC 39 11/14/2018 0006      Wt Readings from Last 3 Encounters:  05/06/19 213 lb 6.4 oz (96.8 kg)  03/19/19 189 lb (85.7 kg)  02/19/19 189 lb (85.7 kg)    ECHO:  11/16/18  Echocardiogram 11/16/2018: Impressions: 1. Moderate hypokinesis of the left ventricular, entire  anteroseptal wall. 2. The left ventricle has low normal systolic function, with an ejection fraction of 50-55%. The cavity size was normal. Left ventricular diastolic Doppler parameters are consistent with pseudonormalization. 3. The right ventricle has normal systolic function. The cavity was normal. There is no increase in right ventricular wall thickness. Right ventricular systolic pressure is mildly elevated. 4. Left atrial size was mildly dilated. 5. The mitral valve is grossly normal. Mild thickening of the mitral valve leaflet. Mild calcification of the mitral valve leaflet. There is moderate mitral annular calcification present. 6. Tricuspid valve regurgitation is moderate-severe. 7. The aortic valve is tricuspid. Moderate thickening of the aortic valve. Moderate calcification of the aortic valve. Aortic valve regurgitation is trivial by color flow Doppler. Mild stenosis of the aortic valve. 8. RCC appears fixed but LCC/NCC open well. 9. The inferior vena cava was dilated in size with <50% respiratory variability.  Summary: Low normal LV EF. On parasternal views, the anteroseptal wall appears mildly hypokinetic, but on short axis views it is not impressive. 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  ASSESSMENT AND PLAN:  1   PAF  Patient had episodes of atrial fibrillation in recent admit in June   CHA2DS2VASc at least 4   He did not respond to b blocker and diltiazem and was treated with IV amio that was switched to PO    Given profound anemia and refusal for endoscopy he was not considered a candidate for anticoagulation  Currently asymptomatic in SR    He is on amiodarone 200 mg daily    I would continue for now   Will get labs (CMET, TSH and CBC)  2   DM   Continues on oral agents  Check Hgb A1C PT without angina but may consider ischemic evaluation  3   HL    WIll get lipids They were good in summer but he was critically ill at the time    4   GI   Pt has refused endoscopy   Will check CBC     5   Anemia   Check CBC    6  HTN   WIll need to be followed   Current medicines are reviewed at length with the patient today.  The patient does not have concerns regarding medicines.  Signed, Dietrich Pates, MD  05/06/2019 11:04 AM    [facility] [address], [place], [place]  [address] Phone: [phone]; Fax: [phone]

## 2019-05-06 NOTE — Patient Instructions (Signed)
Medication Instructions:  No changes *If you need a refill on your cardiac medications before your next appointment, please call your pharmacy*  Lab Work: Today: cbc, cmet, lipids, tsh, hgA1c If you have labs (blood work) drawn today and your tests are completely normal, you will receive your results only by: Marland Kitchen MyChart Message (if you have MyChart) OR . A paper copy in the mail If you have any lab test that is abnormal or we need to change your treatment, we will call you to review the results.  Testing/Procedures: none  Follow-Up: Dr. Harrington Challenger will review your records/results and will determine next follow up/plan of care  Other Instructions

## 2019-05-09 ENCOUNTER — Telehealth: Payer: Self-pay

## 2019-05-09 NOTE — Telephone Encounter (Signed)
At request of Dr Chapman Fitch, attempted to contact patient # [phone] to schedule follow up appointment as soon as possible.  Message left with call back requested to this CM

## 2019-05-15 ENCOUNTER — Ambulatory Visit: Payer: Medicare Other | Admitting: Family

## 2019-05-16 ENCOUNTER — Telehealth: Payer: Self-pay

## 2019-05-16 NOTE — Telephone Encounter (Signed)
Call placed to patient to schedule appointment with PCP for diabetic management.  Appointment scheduled for tomorrow - 12/182020 @ 1550.  He said that he has been trying to watch what he eats and limit the sweets.  He said that he has a working glucometer and his blood sugar yesterday morning was 168.  He has not checked it today but usually checks it every morning.  He has medicare part A and B but no drug coverage. He just missed the open enrollment period. He said that his medications usually cost about $38/month.  His income is $1500/month so he has been over income for medicaid.

## 2019-05-17 ENCOUNTER — Encounter: Payer: Self-pay | Admitting: Family Medicine

## 2019-05-17 ENCOUNTER — Other Ambulatory Visit: Payer: Self-pay

## 2019-05-17 ENCOUNTER — Ambulatory Visit: Payer: Medicare Other | Attending: Family Medicine | Admitting: Family Medicine

## 2019-05-17 DIAGNOSIS — I129 Hypertensive chronic kidney disease with stage 1 through stage 4 chronic kidney disease, or unspecified chronic kidney disease: Secondary | ICD-10-CM

## 2019-05-17 DIAGNOSIS — E1165 Type 2 diabetes mellitus with hyperglycemia: Secondary | ICD-10-CM | POA: Diagnosis not present

## 2019-05-17 DIAGNOSIS — E1122 Type 2 diabetes mellitus with diabetic chronic kidney disease: Secondary | ICD-10-CM

## 2019-05-17 DIAGNOSIS — N1832 Chronic kidney disease, stage 3b: Secondary | ICD-10-CM

## 2019-05-17 DIAGNOSIS — Z89511 Acquired absence of right leg below knee: Secondary | ICD-10-CM | POA: Diagnosis not present

## 2019-05-17 DIAGNOSIS — I10 Essential (primary) hypertension: Secondary | ICD-10-CM

## 2019-05-17 MED ORDER — GLIPIZIDE 5 MG PO TABS: 5.0000 mg | ORAL_TABLET | Freq: Two times a day (BID) | ORAL | 0 refills | Status: DC

## 2019-05-17 MED ORDER — ACCU-CHEK GUIDE ME W/DEVICE KIT: 1.0000 | PACK | Freq: Three times a day (TID) | 0 refills | Status: DC

## 2019-05-17 MED ORDER — ACCU-CHEK FASTCLIX LANCETS MISC: 11 refills | Status: DC

## 2019-05-17 MED ORDER — ACCU-CHEK GUIDE VI STRP: ORAL_STRIP | 12 refills | Status: DC

## 2019-05-17 MED ORDER — ACCU-CHEK FASTCLIX LANCET KIT: PACK | 11 refills | Status: DC

## 2019-05-17 MED FILL — glipiZIDE 5 MG TABS: 5 | 30 days supply | Qty: 60 | Fill #0

## 2019-05-17 NOTE — Progress Notes (Signed)
Virtual Visit via Telephone Note  I connected with Joseph [name] on 05/17/19 at  3:50 PM EST by telephone and verified that I am speaking with the correct person using two identifiers.   I discussed the limitations, risks, security and privacy concerns of performing an evaluation and management service by telephone and the availability of in person appointments. I also discussed with the patient that there may be a patient responsible charge related to this service. The patient expressed understanding and agreed to proceed.  Patient Location: Home Provider Location: Office Others participating in call: none   History of Present Illness:          73 year old diabetic male with prior right transtibial amputation/BKA, diabetic neuropathy,  hypertension, chronic kidney disease and peripheral vascular disease who is seen for continued medical care.  His last telemedicine visit was on 01/04/2019.  He reports the need for refills of medications at today's visit.  He feels that overall he is doing well.  He continues to follow-up with orthopedics regarding his amputation.  He continues to monitor his home blood sugars which she reports are generally in the 140s or less.  He has had no hypoglycemic episodes.  He denies any current increased thirst or urinary frequency.  No current issues with wound/skin breakdown.  He denies any headaches or dizziness related to his blood pressure and reports that he is taking the medication on a daily basis.  He denies any issues with chest pain or palpitations, no shortness of breath or cough.  He is not using any nonsteroidal anti-inflammatories due to his history of chronic kidney disease.  He does not believe that he has ever seen a nephrologist regarding his kidney disease.  Overall he feels well at this time, he denies any abdominal pain-no nausea/vomiting/diarrhea or constipation.  No blood in the stools or dark stools.  No difficulty with swallowing, no sore throat.  No  recent fever or chills,              Past Medical History:  Diagnosis Date  . Cellulitis of right lower extremity   . Diabetes mellitus type 2 in nonobese (HCC)   . Gangrene of right foot ([place])   . Hypertension   . Open wound of right foot     Past Surgical History:  Procedure Laterality Date  . AMPUTATION Right 11/16/2018   Procedure: RIGHT BELOW KNEE AMPUTATION;  Surgeon: Newt Minion, MD;  Location: [facility];  Service: Orthopedics;  Laterality: Right;  . HAND SURGERY      Family History  Problem Relation Age of Onset  . Diabetes Father   . Cancer Neg Hx   . CAD Neg Hx     Social History   Tobacco Use  . Smoking status: Never Smoker  . Smokeless tobacco: Never Used  Substance Use Topics  . Alcohol use: Yes  . Drug use: No     No Known Allergies     Observations/Objective: No vital signs or physical exam conducted as visit was done via telephone  Assessment and Plan: 1. Type 2 diabetes mellitus with hyperglycemia, without long-term current use of insulin ([place]) Patient had blood work done per his orthopedic office on 05/06/2019 which was reviewed and discussed with the patient at today's visit.  His blood sugars remain poorly controlled with hemoglobin A1c of 11.3.  Discussed the importance of remaining compliant with all medications, diabetic diet and monitoring of blood sugars.  He does not wish to come in  to meet with the clinical pharmacist or see endocrinology at this time regarding his blood sugars.  He has been asked to schedule lab visit to come in to have basic metabolic panel in a few weeks to recheck blood sugar and renal function.  New diabetic testing supplies provided this patient believes that his glucometer may not be reading his blood sugars correctly. - glipiZIDE (GLUCOTROL) 5 MG tablet; Take 1 tablet (5 mg total) by mouth 2 (two) times daily before a meal. To lower blood sugar  Dispense: 180 tablet; Refill: 0 - Blood Glucose Monitoring Suppl (ACCU-CHEK  GUIDE ME) w/Device KIT; 1 kit by Does not apply route 3 (three) times daily. To check blood sugars  Dispense: 1 kit; Refill: 0 - glucose blood (ACCU-CHEK GUIDE) test strip; Use as instructed  Dispense: 100 each; Refill: 12 - Lancets Misc. (ACCU-CHEK FASTCLIX LANCET) KIT; Use when checking blood sugars three times daily  Dispense: 1 kit; Refill: 11 - Accu-Chek FastClix Lancets MISC; Use to check blood sugars three times per day  Dispense: 100 each; Refill: 11 - Basic Metabolic Panel; Future - Ambulatory referral to Nephrology  2. Stage 3b chronic kidney disease Patient with stage III chronic kidney disease with most recent creatinine of 2.11 and GFR of 35 on 05/06/2019 blood work done by orthopedics.  Discussed with patient the importance of making sure that his blood sugars and blood pressures are well controlled to help avoid worsening of his chronic kidney disease.  He is also being referred to nephrology.  For further evaluation and treatment.  He also had elevated BUN at 30 suggestive of dehydration and he has been asked to increase his water intake and schedule follow-up BMP. - Blood Glucose Monitoring Suppl (ACCU-CHEK GUIDE ME) w/Device KIT; 1 kit by Does not apply route 3 (three) times daily. To check blood sugars  Dispense: 1 kit; Refill: 0 - glucose blood (ACCU-CHEK GUIDE) test strip; Use as instructed  Dispense: 100 each; Refill: 12 - Lancets Misc. (ACCU-CHEK FASTCLIX LANCET) KIT; Use when checking blood sugars three times daily  Dispense: 1 kit; Refill: 11 - Accu-Chek FastClix Lancets MISC; Use to check blood sugars three times per day  Dispense: 100 each; Refill: 11 - Basic Metabolic Panel; Future - Ambulatory referral to Nephrology  3. Essential hypertension He reports her blood pressure is currently controlled.  He currently takes Lasix as well as amiodarone per cardiology his he also has history of atrial fibrillation.  Importance of maintaining control of blood pressure with goal of  130/80 as well as medication compliance stressed.  Follow Up Instructions:Return in about 3 months (around 08/15/2019) for DM?chronic issues,sooner if needed; lab visit next week.    I discussed the assessment and treatment plan with the patient. The patient was provided an opportunity to ask questions and all were answered. The patient agreed with the plan and demonstrated an understanding of the instructions.   The patient was advised to call back or seek an in-person evaluation if the symptoms worsen or if the condition fails to improve as anticipated.  I provided 14 minutes of non-face-to-face time during this encounter.   Antony Blackbird, MD

## 2019-05-17 NOTE — Progress Notes (Signed)
CBG at 11am fasting was 178

## 2019-05-20 ENCOUNTER — Ambulatory Visit: Payer: Medicare Other | Attending: Family Medicine

## 2019-05-20 ENCOUNTER — Other Ambulatory Visit: Payer: Self-pay

## 2019-05-20 DIAGNOSIS — I739 Peripheral vascular disease, unspecified: Secondary | ICD-10-CM

## 2019-05-20 DIAGNOSIS — N183 Chronic kidney disease, stage 3 unspecified: Secondary | ICD-10-CM

## 2019-05-20 DIAGNOSIS — E1165 Type 2 diabetes mellitus with hyperglycemia: Secondary | ICD-10-CM

## 2019-05-20 DIAGNOSIS — E1142 Type 2 diabetes mellitus with diabetic polyneuropathy: Secondary | ICD-10-CM

## 2019-05-20 DIAGNOSIS — E440 Moderate protein-calorie malnutrition: Secondary | ICD-10-CM

## 2019-05-20 DIAGNOSIS — D62 Acute posthemorrhagic anemia: Secondary | ICD-10-CM

## 2019-05-20 DIAGNOSIS — N1832 Chronic kidney disease, stage 3b: Secondary | ICD-10-CM

## 2019-05-20 DIAGNOSIS — E1122 Type 2 diabetes mellitus with diabetic chronic kidney disease: Secondary | ICD-10-CM

## 2019-05-20 MED FILL — !TRUE METRIX BLOOD GLUCOSE: 30 days supply | Qty: 1 | Fill #0

## 2019-05-20 MED FILL — TRUE METRIX GLUCOSE TEST ST: 33 days supply | Qty: 100 | Fill #0

## 2019-05-20 MED FILL — TRUEplus LANCETS 28G MISC: 33 days supply | Qty: 100 | Fill #0

## 2019-05-21 LAB — CBC WITH DIFFERENTIAL/PLATELET
Basophils Absolute: 0.1 x10E3/uL (ref 0.0–0.2)
Basos: 1 %
EOS (ABSOLUTE): 0.3 x10E3/uL (ref 0.0–0.4)
Eos: 3 %
Hematocrit: 34.6 % — ABNORMAL LOW (ref 37.5–51.0)
Hemoglobin: 11.2 g/dL — ABNORMAL LOW (ref 13.0–17.7)
Immature Grans (Abs): 0 x10E3/uL (ref 0.0–0.1)
Immature Granulocytes: 0 %
Lymphocytes Absolute: 4.1 x10E3/uL — ABNORMAL HIGH (ref 0.7–3.1)
Lymphs: 44 %
MCH: 29.2 pg (ref 26.6–33.0)
MCHC: 32.4 g/dL (ref 31.5–35.7)
MCV: 90 fL (ref 79–97)
Monocytes Absolute: 0.6 x10E3/uL (ref 0.1–0.9)
Monocytes: 7 %
Neutrophils Absolute: 4.3 x10E3/uL (ref 1.4–7.0)
Neutrophils: 45 %
Platelets: 213 x10E3/uL (ref 150–450)
RBC: 3.83 x10E6/uL — ABNORMAL LOW (ref 4.14–5.80)
RDW: 13.4 % (ref 11.6–15.4)
WBC: 9.3 x10E3/uL (ref 3.4–10.8)

## 2019-05-21 LAB — COMPREHENSIVE METABOLIC PANEL WITH GFR
ALT: 16 IU/L (ref 0–44)
AST: 26 IU/L (ref 0–40)
Albumin/Globulin Ratio: 1.3 (ref 1.2–2.2)
Albumin: 4.1 g/dL (ref 3.7–4.7)
Alkaline Phosphatase: 47 IU/L (ref 39–117)
BUN/Creatinine Ratio: 13 (ref 10–24)
BUN: 34 mg/dL — ABNORMAL HIGH (ref 8–27)
Bilirubin Total: 0.3 mg/dL (ref 0.0–1.2)
CO2: 22 mmol/L (ref 20–29)
Calcium: 9 mg/dL (ref 8.6–10.2)
Chloride: 100 mmol/L (ref 96–106)
Creatinine, Ser: 2.56 mg/dL — ABNORMAL HIGH (ref 0.76–1.27)
GFR calc Af Amer: 28 mL/min/1.73 — ABNORMAL LOW
GFR calc non Af Amer: 24 mL/min/1.73 — ABNORMAL LOW
Globulin, Total: 3.1 g/dL (ref 1.5–4.5)
Glucose: 175 mg/dL — ABNORMAL HIGH (ref 65–99)
Potassium: 4.7 mmol/L (ref 3.5–5.2)
Sodium: 141 mmol/L (ref 134–144)
Total Protein: 7.2 g/dL (ref 6.0–8.5)

## 2019-05-21 LAB — LIPID PANEL
Chol/HDL Ratio: 2.6 ratio (ref 0.0–5.0)
Cholesterol, Total: 116 mg/dL (ref 100–199)
HDL: 44 mg/dL
LDL Chol Calc (NIH): 53 mg/dL (ref 0–99)
Triglycerides: 99 mg/dL (ref 0–149)
VLDL Cholesterol Cal: 19 mg/dL (ref 5–40)

## 2019-05-22 ENCOUNTER — Other Ambulatory Visit: Payer: Self-pay | Admitting: Family Medicine

## 2019-05-22 DIAGNOSIS — E1165 Type 2 diabetes mellitus with hyperglycemia: Secondary | ICD-10-CM

## 2019-05-22 DIAGNOSIS — Z79899 Other long term (current) drug therapy: Secondary | ICD-10-CM

## 2019-05-22 DIAGNOSIS — N1832 Chronic kidney disease, stage 3b: Secondary | ICD-10-CM

## 2019-05-22 DIAGNOSIS — Z1211 Encounter for screening for malignant neoplasm of colon: Secondary | ICD-10-CM

## 2019-05-22 MED ORDER — ATORVASTATIN CALCIUM 10 MG PO TABS: 10.0000 mg | ORAL_TABLET | Freq: Every day | ORAL | 3 refills | Status: DC

## 2019-05-22 NOTE — Progress Notes (Unsigned)
Patient ID: Joseph [name], male   DOB: 12-17-1945, 73 y.o.   MRN: [id number]   Patient with recent blood work with elevated creatinine and anemia as well as patient with DM and not currently on statin therapy. Not sure if anemia is related to CKD or GI but patient also needs a colonoscopy and agrees to GI referral as well as nephrology referral and to start a low dose statin. He will be contacted to have LFT's in about 6 weeks after starting new statin medication

## 2019-05-27 MED FILL — FUROSEMIDE 40 MG TAB: 40 | 30 days supply | Qty: 30 | Fill #1

## 2019-05-27 MED FILL — ATORVASTATIN 10 MG TABLET: 10 | 30 days supply | Qty: 30 | Fill #0

## 2019-05-28 ENCOUNTER — Other Ambulatory Visit: Payer: Self-pay

## 2019-05-28 ENCOUNTER — Encounter: Payer: Self-pay | Admitting: Family

## 2019-05-28 ENCOUNTER — Ambulatory Visit (INDEPENDENT_AMBULATORY_CARE_PROVIDER_SITE_OTHER): Payer: Medicare Other | Admitting: Family

## 2019-05-28 VITALS — Ht 75.0 in | Wt 213.4 lb

## 2019-05-28 DIAGNOSIS — Z89511 Acquired absence of right leg below knee: Secondary | ICD-10-CM

## 2019-05-28 NOTE — Progress Notes (Signed)
   Post-Op Visit Note   Patient: Joseph [name]           Date of Birth: 1945-09-09           MRN: [id number] Visit Date: 05/28/2019 PCP: Antony Blackbird, MD  Chief Complaint:  Chief Complaint  Patient presents with  . Right Leg - Follow-up    11/16/2018 right BKA    HPI:  HPI the patient is a 73 year old gentleman who presents today status post right below the knee amputation June 19 of this year.  States he has his prosthesis now and is only using sparingly. Continues with an ulcer to his incision.   Ortho Exam On examination of the right residual limb there is a 4 cm x 8 mm ulcer laterally this is 100% filled in with fibrinous tissue. Scant clear drainage. there is no surrounding erythema  no sign of infection  Visit Diagnoses:  No diagnosis found.  Plan:  Continue shrinker.  Discussed importance of nonweightbearing in his prosthetic until this is healed.  He will follow up in 4-6 weeks.  Have discussed return precautions.  Follow-Up Instructions: No follow-ups on file.   Imaging: No results found.  Orders:  No orders of the defined types were placed in this encounter.  No orders of the defined types were placed in this encounter.    PMFS History: Patient Active Problem List   Diagnosis Date Noted  . Postoperative pain   . AKI (acute kidney injury) ([place])   . Acute blood loss anemia   . Chronic diastolic congestive heart failure ([place])   . Diabetic peripheral neuropathy ([place])   . CKD (chronic kidney disease), stage II   . 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 Leukocytosis   . 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[place]) 11/20/2018  . Atrial fibrillation ([place])   . Diabetic polyneuropathy associated with type 2 diabetes mellitus ([place])   . PVD (peripheral vascular disease) ([place])   . Critical lower limb ischemia   . Sepsis with acute renal failure ([place]) 11/13/2018  . Acute renal failure superimposed on stage 2 chronic kidney disease ([place])   . 'light-for-dates'  infant with signs of fetal malnutrition 10/17/2018  . DM (diabetes mellitus) ([place]) 07/02/2012  . Hypertension associated with diabetes ([place]) 07/02/2012   Past Medical History:  Diagnosis Date  . Cellulitis of right lower extremity   . Diabetes mellitus type 2 in nonobese (HCC)   . Gangrene of right foot ([place])   . Hypertension   . Open wound of right foot     Family History  Problem Relation Age of Onset  . Diabetes Father   . Cancer Neg Hx   . CAD Neg Hx     Past Surgical History:  Procedure Laterality Date  . AMPUTATION Right 11/16/2018   Procedure: RIGHT BELOW KNEE AMPUTATION;  Surgeon: Newt Minion, MD;  Location: [facility];  Service: Orthopedics;  Laterality: Right;  . HAND SURGERY     Social History   Occupational History  . Not on file  Tobacco Use  . Smoking status: Never Smoker  . Smokeless tobacco: Never Used  Substance and Sexual Activity  . Alcohol use: Yes  . Drug use: No  . Sexual activity: Not on file

## 2019-06-24 MED FILL — PANTOPRAZOLE SOD DR 40 MG T: 40 | 30 days supply | Qty: 60 | Fill #0

## 2019-06-24 MED FILL — glipiZIDE 5 MG TABS: 5 | 30 days supply | Qty: 60 | Fill #1

## 2019-07-09 ENCOUNTER — Other Ambulatory Visit: Payer: Self-pay

## 2019-07-09 ENCOUNTER — Encounter: Payer: Self-pay | Admitting: Family

## 2019-07-09 ENCOUNTER — Ambulatory Visit (INDEPENDENT_AMBULATORY_CARE_PROVIDER_SITE_OTHER): Payer: Medicare Other | Admitting: Family

## 2019-07-09 VITALS — Ht 75.0 in | Wt 213.0 lb

## 2019-07-09 DIAGNOSIS — Z89511 Acquired absence of right leg below knee: Secondary | ICD-10-CM

## 2019-07-09 DIAGNOSIS — E1142 Type 2 diabetes mellitus with diabetic polyneuropathy: Secondary | ICD-10-CM | POA: Diagnosis not present

## 2019-07-09 NOTE — Progress Notes (Signed)
   Post-Op Visit Note   Patient: Joseph [name]           Date of Birth: 01/18/1946           MRN: [id number] Visit Date: 07/09/2019 PCP: Cain Saupe, MD  Chief Complaint:  Chief Complaint  Patient presents with  . Right Leg - Follow-up    11/16/18 right BKA     HPI:  HPI the patient is a 74 year old gentleman who presents today status post right below the knee amputation June 19 of this year. Is ambulating in his prosthesis now and is only using sparingly. Continues with an ulcer along his incision medially.  Ortho Exam On examination of the right residual limb there is a 8 mm x 4  mm ulcer medially. This probes 5 mm deep. Scant clear drainage. there is no surrounding erythema  no sign of infection  Visit Diagnoses:  No diagnosis found.  Plan: antibacterial ointment dressings. Continue shrinker.  Discussed importance of nonweightbearing in his prosthetic until this is healed.  He will follow up in 4-6 weeks.  Have discussed return precautions.  Follow-Up Instructions: Return in about 4 weeks (around 08/06/2019).   Imaging: No results found.  Orders:  No orders of the defined types were placed in this encounter.  No orders of the defined types were placed in this encounter.    PMFS History: Patient Active Problem List   Diagnosis Date Noted  . Postoperative pain   . AKI (acute kidney injury) (HCC)   . Acute blood loss anemia   . Chronic diastolic congestive heart failure (HCC)   . Diabetic peripheral neuropathy (HCC)   . CKD (chronic kidney disease), stage II   . 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 Leukocytosis   . 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 Atrial fibrillation (HCC)   . Diabetic polyneuropathy associated with type 2 diabetes mellitus (HCC)   . PVD (peripheral vascular disease) (HCC)   . Critical lower limb ischemia   . Sepsis with acute renal failure (HCC) 11/13/2018  . Acute renal failure superimposed on stage 2 chronic  kidney disease (HCC)   . 'light-for-dates' infant with signs of fetal malnutrition 10/17/2018  . DM (diabetes mellitus) (HCC) 07/02/2012  . Hypertension associated with diabetes (HCC) 07/02/2012   Past Medical History:  Diagnosis Date  . Cellulitis of right lower extremity   . Diabetes mellitus type 2 in nonobese (HCC)   . Gangrene of right foot (HCC)   . Hypertension   . Open wound of right foot     Family History  Problem Relation Age of Onset  . Diabetes Father   . Cancer Neg Hx   . CAD Neg Hx     Past Surgical History:  Procedure Laterality Date  . AMPUTATION Right 11/16/2018   Procedure: RIGHT BELOW KNEE AMPUTATION;  Surgeon: [name], MD;  Location: [facility];  Service: Orthopedics;  Laterality: Right;  . HAND SURGERY     Social History   Occupational History  . Not on file  Tobacco Use  . Smoking status: Never Smoker  . Smokeless tobacco: Never Used  Substance and Sexual Activity  . Alcohol use: Yes  . Drug use: No  . Sexual activity: Not on file

## 2019-08-06 ENCOUNTER — Ambulatory Visit: Payer: Medicare Other | Admitting: Family

## 2019-08-19 MED FILL — glipiZIDE 5 MG TABS: 5 | 30 days supply | Qty: 60 | Fill #2

## 2019-08-19 MED FILL — PANTOPRAZOLE SOD DR 40 MG T: 40 | 30 days supply | Qty: 60 | Fill #1

## 2019-08-20 ENCOUNTER — Ambulatory Visit: Payer: Medicare Other | Admitting: Family

## 2019-09-20 ENCOUNTER — Ambulatory Visit: Payer: Medicare Other | Admitting: Family

## 2019-09-20 ENCOUNTER — Ambulatory Visit (INDEPENDENT_AMBULATORY_CARE_PROVIDER_SITE_OTHER): Payer: Medicare Other | Admitting: Family

## 2019-09-20 ENCOUNTER — Other Ambulatory Visit: Payer: Self-pay

## 2019-09-20 ENCOUNTER — Encounter: Payer: Self-pay | Admitting: Family

## 2019-09-20 DIAGNOSIS — Z89511 Acquired absence of right leg below knee: Secondary | ICD-10-CM

## 2019-09-20 NOTE — Progress Notes (Signed)
   Post-Op Visit Note   Patient: Joseph [name]           Date of Birth: 06-22-1945           MRN: [id number] Visit Date: 09/20/2019 PCP: Cain Saupe, MD  Chief Complaint:  No chief complaint on file.   HPI:  HPI the patient is a 74 year old gentleman who presents today status post right below the knee amputation June 19 of 2020. Is ambulating in his prosthesis now and is only using sparingly. Continues with an ulcer along his incision line medially. Has been applying triple abx ointment.  Ortho Exam On examination of the right residual limb there is a 3 mm in diameter ulceration, this is 3 mm deep, no tunneling or undermining. no drainage. there is no surrounding erythema  no sign of infection  Visit Diagnoses:  No diagnosis found.  Plan: antibacterial ointment dressings. Continue shrinker.  He will follow up in 4-6 weeks.    Follow-Up Instructions: Return in about 6 weeks (around 11/01/2019).   Imaging: No results found.  Orders:  No orders of the defined types were placed in this encounter.  No orders of the defined types were placed in this encounter.    PMFS History: Patient Active Problem List   Diagnosis Date Noted  . Postoperative pain   . AKI (acute kidney injury) (HCC)   . Acute blood loss anemia   . Chronic diastolic congestive heart failure (HCC)   . Diabetic peripheral neuropathy (HCC)   . CKD (chronic kidney disease), stage II   . 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 Leukocytosis   . 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 Atrial fibrillation (HCC)   . Diabetic polyneuropathy associated with type 2 diabetes mellitus (HCC)   . PVD (peripheral vascular disease) (HCC)   . Critical lower limb ischemia   . Sepsis with acute renal failure (HCC) 11/13/2018  . Acute renal failure superimposed on stage 2 chronic kidney disease (HCC)   . 'light-for-dates' infant with signs of fetal malnutrition 10/17/2018  . DM (diabetes mellitus)  (HCC) 07/02/2012  . Hypertension associated with diabetes (HCC) 07/02/2012   Past Medical History:  Diagnosis Date  . Cellulitis of right lower extremity   . Diabetes mellitus type 2 in nonobese (HCC)   . Gangrene of right foot (HCC)   . Hypertension   . Open wound of right foot     Family History  Problem Relation Age of Onset  . Diabetes Father   . Cancer Neg Hx   . CAD Neg Hx     Past Surgical History:  Procedure Laterality Date  . AMPUTATION Right 11/16/2018   Procedure: RIGHT BELOW KNEE AMPUTATION;  Surgeon: [name], MD;  Location: [facility] OR;  Service: Orthopedics;  Laterality: Right;  . HAND SURGERY     Social History   Occupational History  . Not on file  Tobacco Use  . Smoking status: Never Smoker  . Smokeless tobacco: Never Used  Substance and Sexual Activity  . Alcohol use: Yes  . Drug use: No  . Sexual activity: Not on file

## 2019-09-26 ENCOUNTER — Ambulatory Visit (INDEPENDENT_AMBULATORY_CARE_PROVIDER_SITE_OTHER): Payer: Medicare Other | Admitting: Physical Therapy

## 2019-09-26 ENCOUNTER — Other Ambulatory Visit: Payer: Self-pay

## 2019-09-26 ENCOUNTER — Encounter: Payer: Self-pay | Admitting: Physical Therapy

## 2019-09-26 DIAGNOSIS — R2681 Unsteadiness on feet: Secondary | ICD-10-CM | POA: Diagnosis not present

## 2019-09-26 DIAGNOSIS — R293 Abnormal posture: Secondary | ICD-10-CM

## 2019-09-26 DIAGNOSIS — R2689 Other abnormalities of gait and mobility: Secondary | ICD-10-CM | POA: Diagnosis not present

## 2019-09-26 DIAGNOSIS — S81801A Unspecified open wound, right lower leg, initial encounter: Secondary | ICD-10-CM

## 2019-09-26 NOTE — Therapy (Signed)
[facility] Physical Therapy [address] [place], [place], [address] Phone: [phone]   Fax:  [phone]  Physical Therapy Evaluation  Patient Details  Name: Joseph [name] MRN: [id number] Date of Birth: 02-09-1946 Referring Provider (PT): Aldean Baker, MD   Encounter Date: 09/26/2019  PT End of Session - 09/26/19 2003    Visit Number  1    Number of Visits  13    Date for PT Re-Evaluation  12/25/19    Authorization Type  Medicare    PT Start Time  1105    PT Stop Time  1145    PT Time Calculation (min)  40 min    Equipment Utilized During Treatment  Gait belt    Activity Tolerance  Patient tolerated treatment well    Behavior During Therapy  [facility] for tasks assessed/performed       Past Medical History:  Diagnosis Date  . Cellulitis of right lower extremity   . Diabetes mellitus type 2 in nonobese (HCC)   . Gangrene of right foot (HCC)   . Hypertension   . Open wound of right foot     Past Surgical History:  Procedure Laterality Date  . AMPUTATION Right 11/16/2018   Procedure: RIGHT BELOW KNEE AMPUTATION;  Surgeon: [name], MD;  Location: [facility] OR;  Service: Orthopedics;  Laterality: Right;  . HAND SURGERY      There were no vitals filed for this visit.   Subjective Assessment - 09/26/19 1103    Subjective  This 74yo male was referred on 09/20/2019 with Right Transtibial Amputation by Aldean Baker, MD. He underwent a right Transtibial Amputation on 11/16/2018 with gangrene wound. He recieved his prosthesis October 2020. He had wound on limb. MD released him to begin prosthetic training with PT on 09/20/2019    Pertinent History  Rt TTA, DM2, HTN, neuropathy, PVD, A-Fib,    Limitations  Walking;House hold activities    Patient Stated Goals  He wants to improve his balance & make sure he is using prosthesis correctly including yard work.    Currently in Pain?  No/denies         [facility] PT Assessment - 09/26/19 1105      Assessment   Medical  Diagnosis  Right Transtibial Amputation    Referring Provider (PT)  Aldean Baker, MD    Onset Date/Surgical Date  09/20/19   MD referral to PT with wound healing   Hand Dominance  Right    Prior Therapy  in hospital  & HHPT last year      Precautions   Precautions  Fall      Balance Screen   Has the patient fallen in the past 6 months  No    Has the patient had a decrease in activity level because of a fear of falling?   No    Is the patient reluctant to leave their home because of a fear of falling?   No      Home Environment   Living Environment  Private residence    Living Arrangements  Alone    Type of Home  House    Home Access  Ramped entrance;Stairs to enter    Entrance Stairs-Number of Steps  3    Entrance Stairs-Rails  None    Home Layout  One level   single step to den with ramp   Home Equipment  Walker - 2 wheels;Wheelchair - manual;Cane - single point;Shower seat      Prior Function  Level of Independence  Independent;Independent with household mobility without device;Independent with community mobility without device    Vocation  Retired    Leisure  model railroad,       Posture/Postural Control   Posture/Postural Control  Postural limitations    Postural Limitations  Rounded Shoulders;Forward head;Weight shift left      ROM / Strength   AROM / PROM / Strength  AROM;Strength      AROM   Overall AROM   Within functional limits for tasks performed      Strength   Overall Strength  Within functional limits for tasks performed    Overall Strength Comments  gross testing seated 5/5 BUEs & BLEs      Transfers   Transfers  Sit to Stand;Stand to Sit    Sit to Stand  6: Modified independent (Device/Increase time);With upper extremity assist;With armrests;From chair/3-in-1    Stand to Sit  6: Modified independent (Device/Increase time);With upper extremity assist;With armrests;To chair/3-in-1      Ambulation/Gait   Ambulation/Gait  Yes    Ambulation/Gait  Assistance  5: Supervision    Ambulation Distance (Feet)  300 Feet    Assistive device  Prosthesis;None    Gait Pattern  Step-through pattern;Decreased arm swing - right;Decreased step length - left;Decreased stance time - right;Abducted- right    Ambulation Surface  Level;Indoor    Gait velocity  3.34 ft/sec comfortable pace & 4.74 ft/sec fast pace with increased gait deviations    Ramp  5: Supervision   prosthesis only   Curb  5: Supervision   prosthesis only     Standardized Balance Assessment   Standardized Balance Assessment  Berg Balance Test      Berg Balance Test   Sit to Stand  Able to stand  independently using hands    Standing Unsupported  Able to stand safely 2 minutes    Sitting with Back Unsupported but Feet Supported on Floor or Stool  Able to sit safely and securely 2 minutes    Stand to Sit  Controls descent by using hands    Transfers  Able to transfer safely, minor use of hands    Standing Unsupported with Eyes Closed  Able to stand 10 seconds with supervision    Standing Unsupported with Feet Together  Needs help to attain position but able to stand for 30 seconds with feet together    From Standing, Reach Forward with Outstretched Arm  Can reach forward >12 cm safely (5")    From Standing Position, Pick up Object from Floor  Able to pick up shoe, needs supervision    From Standing Position, Turn to Look Behind Over each Shoulder  Looks behind one side only/other side shows less weight shift    Turn 360 Degrees  Able to turn 360 degrees safely but slowly    Standing Unsupported, Alternately Place Feet on Step/Stool  Able to complete 4 steps without aid or supervision    Standing Unsupported, One Foot in Front  Able to take small step independently and hold 30 seconds    Standing on One Leg  Tries to lift leg/unable to hold 3 seconds but remains standing independently    Total Score  38    Berg comment:  <45/56 indicates high fall risk      Functional Gait   Assessment   Gait assessed   Yes    Gait Level Surface  Walks 20 ft in less than 7 sec but greater than 5.5  sec, uses assistive device, slower speed, mild gait deviations, or deviates 6-10 in outside of the 12 in walkway width.    Change in Gait Speed  Able to change speed, demonstrates mild gait deviations, deviates 6-10 in outside of the 12 in walkway width, or no gait deviations, unable to achieve a major change in velocity, or uses a change in velocity, or uses an assistive device.    Gait with Horizontal Head Turns  Performs head turns smoothly with slight change in gait velocity (eg, minor disruption to smooth gait path), deviates 6-10 in outside 12 in walkway width, or uses an assistive device.    Gait with Vertical Head Turns  Performs task with moderate change in gait velocity, slows down, deviates 10-15 in outside 12 in walkway width but recovers, can continue to walk.    Gait and Pivot Turn  Turns slowly, requires verbal cueing, or requires several small steps to catch balance following turn and stop    Step Over Obstacle  Is able to step over one shoe box (4.5 in total height) but must slow down and adjust steps to clear box safely. May require verbal cueing.    Gait with Narrow Base of Support  Ambulates less than 4 steps heel to toe or cannot perform without assistance.    Gait with Eyes Closed  Walks 20 ft, uses assistive device, slower speed, mild gait deviations, deviates 6-10 in outside 12 in walkway width. Ambulates 20 ft in less than 9 sec but greater than 7 sec.    Ambulating Backwards  Walks 20 ft, slow speed, abnormal gait pattern, evidence for imbalance, deviates 10-15 in outside 12 in walkway width.    Steps  Two feet to a stair, must use rail.    Total Score  13    FGA comment:  <19/30 indicates high fall risk      Prosthetics Assessment - 09/26/19 1105      Prosthetics   Prosthetic Care Dependent with  Skin check;Residual limb care;Prosthetic cleaning;Ply sock  cleaning;Correct ply sock adjustment;Proper wear schedule/adjustment;Proper weight-bearing schedule/adjustment    Donning prosthesis   Supervision    Doffing prosthesis   Modified independent (Device/Increase time)    Current prosthetic wear tolerance (days/week)   daily    Current prosthetic wear tolerance (#hours/day)   reports most of awake hours    Current prosthetic weight-bearing tolerance (hours/day)   Patient tolerated standing & gait activities >10 minutes without limb pain or discomfort    Edema  non-pitting    Residual limb condition   medial incision wound 5mm with white (over moist) eschar.  on scar at distal tibia dry scab with no signs of infection.  normal hair growth & color. cylinderical shape.      Prosthesis Description  silicon liner with shuttle pin lock suspension, dynamic response Flex Foot    K code/activity level with prosthetic use   K3 full community with variable speeds               Objective measurements completed on examination: See above findings.      [facility]ercise - 09/26/19 1105      Prosthetics   Prosthetic Care Comments   Pt was covering wound with BandAid. PT demo & verbal cues on using Tegaderm with no thickness. PT reocmmended apply Tegaderm in morning & remove with doffing.  Need to switch liners each day.      Education Provided  Skin check;Residual limb care;Prosthetic cleaning;Correct ply  sock adjustment;Proper Donning;Proper wear schedule/adjustment    Person(s) Educated  Patient    Education Method  Explanation;Demonstration;Verbal cues    Education Method  Verbalized understanding;Needs further instruction               PT Short Term Goals - 09/26/19 2027      PT SHORT TERM GOAL #1   Title  Patient reports proper wound care & prosthesis cleaning with prosthesis wear.  (All STGs Target Date: 10/25/2019)    Time  1    Period  Months    Status  New    Target Date  10/25/19      PT SHORT TERM GOAL #2    Title  Merrilee Jansky Balance >42/56    Time  1    Period  Months    Status  New    Target Date  10/25/19      PT SHORT TERM GOAL #3   Title  Patient ambulates with prosthesis only 500' with verbal cues on gait deviations & no loss of balance.    Time  1    Period  Months    Status  New    Target Date  10/25/19      PT SHORT TERM GOAL #4   Title  Patient negoatiates ramps & curbs with prosthesis only with supervision using proper techniques.    Time  1    Period  Months    Status  New    Target Date  10/25/19        PT Long Term Goals - 09/26/19 2020      PT LONG TERM GOAL #1   Title  Patient verbalizes & demonstrates proper prosthetic care to enable safe utilization.  (All LTGs Target Date:  12/19/2019)    Time  12    Period  Weeks    Status  New    Target Date  12/19/19      PT LONG TERM GOAL #2   Title  Patient tolerates prostheis wear >90% of awake hours with no wound or residual limb issues.    Time  12    Period  Weeks    Status  New    Target Date  12/19/19      PT LONG TERM GOAL #3   Title  Berg Balance >/= 48/56 to indicate lower fall risk.    Time  12    Period  Weeks    Status  New    Target Date  12/19/19      PT LONG TERM GOAL #4   Title  Functional Gait Assessment >19/30 with prosthesis only to indicate lower fall risk.    Time  12    Period  Weeks    Status  New    Target Date  12/19/19      PT LONG TERM GOAL #5   Title  Patient ambulates >1000' outdoors including grass, ramps, curbs & stairs alternating with single rail independently.    Time  12    Period  Weeks    Status  New    Target Date  12/19/19      Additional Long Term Goals   Additional Long Term Goals  Yes      PT LONG TERM GOAL #6   Title  Patient demonstrates proper technique lifting / carrying 25# box, pushing / pulling & floor transfers to enable return to yard work tasks.    Time  12    Period  Weeks    Status  New    Target Date  12/19/19             Plan -  09/26/19 2009    Clinical Impression Statement  This 74yo male underwent a right Transtibial Amputation on 11/16/2018. He recieved prosthesis in October 2020 but has been limited in wear & use by wound on his residual limb. He was released on 09/20/2019 to begin prosthetic training with PT.  He still has 51mm wound on medial incision with no drainage & no signs of infection.  Patient is dependent in safe & proper prosthetic care.  Berg Balance 38/56 indicates high fall risk.  Functional Gait Assessment 13/30 also indicates high fall risk. Gait velocity 3.34 ft/sec comfortable & 4.74 ft/sec fast pace with significant increases in gait deviation. Patient has gait deviations indicating fall risk & increased energy expenditure.  Patient is dependent in ability to perform tasks lifing, carrying, pushing & pulling. Patient would benefit from instruction in prosthetic care & use.    Personal Factors and Comorbidities  Comorbidity 3+;Time since onset of injury/illness/exacerbation    Comorbidities  Rt TTA, DM2, HTN, neuropathy, PVD, A-Fib,    Examination-Activity Limitations  Lift;Locomotion Level;Squat;Stairs;Stand;Transfers    Examination-Participation Restrictions  Community Activity;Yard Work    Stability/Clinical Decision Making  Stable/Uncomplicated    Clinical Decision Making  Moderate    Rehab Potential  Good    PT Frequency  1x / week    PT Duration  12 weeks    PT Treatment/Interventions  ADLs/Self Care Home Management;DME Instruction;Gait training;Stair training;Functional mobility training;Therapeutic activities;Therapeutic exercise;Balance training;Neuromuscular re-education;Patient/family education;Prosthetic Training;Vestibular    PT Next Visit Plan  check wound, review prosthetic care, HEP for balance & proprioception,    Consulted and Agree with Plan of Care  Patient       Patient will benefit from skilled therapeutic intervention in order to improve the following deficits and impairments:   Abnormal gait, Decreased balance, Decreased endurance, Decreased knowledge of use of DME, Decreased mobility, Decreased skin integrity, Postural dysfunction, Decreased scar mobility, Prosthetic Dependency  Visit Diagnosis: Other abnormalities of gait and mobility  Unsteadiness on feet  Abnormal posture  Wound of right lower extremity, initial encounter     Problem List Patient Active Problem List   Diagnosis Date Noted  . Postoperative pain   . AKI (acute kidney injury) (HCC)   . Acute blood loss anemia   . Chronic diastolic congestive heart failure (HCC)   . Diabetic peripheral neuropathy (HCC)   . CKD (chronic kidney disease), stage II   . 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 Leukocytosis   . 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 Atrial fibrillation (HCC)   . Diabetic polyneuropathy associated with type 2 diabetes mellitus (HCC)   . PVD (peripheral vascular disease) (HCC)   . Critical lower limb ischemia   . Sepsis with acute renal failure (HCC) 11/13/2018  . Acute renal failure superimposed on stage 2 chronic kidney disease (HCC)   . 'light-for-dates' infant with signs of fetal malnutrition 10/17/2018  . DM (diabetes mellitus) (HCC) 07/02/2012  . Hypertension associated with diabetes (HCC) 07/02/2012    Vladimir Faster PT, DPT 09/26/2019, 8:31 PM  [facility] Physical Therapy [address] [place], [place], [address] Phone: [phone]   Fax:  [phone]  Name: Joseph Fuertes MRN: [id number] Date of Birth: [birth year]

## 2019-10-03 ENCOUNTER — Other Ambulatory Visit: Payer: Self-pay

## 2019-10-03 ENCOUNTER — Encounter: Payer: Self-pay | Admitting: Physical Therapy

## 2019-10-03 ENCOUNTER — Ambulatory Visit (INDEPENDENT_AMBULATORY_CARE_PROVIDER_SITE_OTHER): Payer: Medicare Other | Admitting: Physical Therapy

## 2019-10-03 DIAGNOSIS — R531 Weakness: Secondary | ICD-10-CM | POA: Diagnosis not present

## 2019-10-03 DIAGNOSIS — R2689 Other abnormalities of gait and mobility: Secondary | ICD-10-CM

## 2019-10-03 DIAGNOSIS — R2681 Unsteadiness on feet: Secondary | ICD-10-CM | POA: Diagnosis not present

## 2019-10-03 DIAGNOSIS — S81801A Unspecified open wound, right lower leg, initial encounter: Secondary | ICD-10-CM | POA: Diagnosis not present

## 2019-10-03 DIAGNOSIS — R293 Abnormal posture: Secondary | ICD-10-CM

## 2019-10-03 NOTE — Patient Instructions (Addendum)
Access Code: [email or web address] URL: [email or web address] Date: 10/03/2019 Prepared by: [facility] - Outpatient Rehab Neuro  Exercises Standing Weight Shift Side to Side - 1 x daily - 7 x weekly - 1 sets - 10 reps - 5 seconds hold Standing Anterior Posterior Weight Shift - 1 x daily - 7 x weekly - 1 sets - 10 reps - 5 seconds hold Alternating Punch with Resistance - 1 x daily - 7 x weekly - 1 sets - 10 reps - 5 seconds hold Standing Scapular Protraction with Resistance - 1 x daily - 7 x weekly - 10 reps - 1 sets - 5 seconds hold Standing alternate rows with resistance - 1 x daily - 7 x weekly - 10 reps - 1 sets - 5 seconds hold Standing Row with Anchored Resistance - 1 x daily - 7 x weekly - 10 reps - 1 sets - 5 seconds hold Alternating elbow flexion with resistance - 1 x daily - 7 x weekly - 10 reps - 1 sets - 5 seconds hold Standing Bicep Curls with Resistance - 1 x daily - 7 x weekly - 10 reps - 1 sets - 5 seconds hold Standing Hip Flexion with Resistance - 1 x daily - 7 x weekly - 10 reps - 1 sets Standing Hip Extension with Resistance - 1 x daily - 7 x weekly - 10 reps - 1 sets Standing Hip Adduction with Resistance - 1 x daily - 7 x weekly - 10 reps - 1 sets Standing Hip Abduction with Theraband Resistance - 1 x daily - 7 x weekly - 10 reps - 1 sets

## 2019-10-03 NOTE — Therapy (Signed)
[facility] [address] [place], [place], [address] Phone: [phone]   Fax:  [phone]  Physical Therapy Treatment  Patient Details  Name: Joseph [name] MRN: [id number] Date of Birth: January 24, 1946 Referring Provider (PT): Meridee Score, MD   Encounter Date: 10/03/2019  PT End of Session - 10/03/19 1253    Visit Number  2    Number of Visits  13    Date for PT Re-Evaluation  12/25/19    Authorization Type  Medicare    PT Start Time  0935    PT Stop Time  1020    PT Time Calculation (min)  45 min    Equipment Utilized During Treatment  Gait belt    Activity Tolerance  Patient tolerated treatment well    Behavior During Therapy  [facility] for tasks assessed/performed       Past Medical History:  Diagnosis Date  . Cellulitis of right lower extremity   . Diabetes mellitus type 2 in nonobese (HCC)   . 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 Hypertension   . Open wound of right foot     Past Surgical History:  Procedure Laterality Date  . AMPUTATION Right 11/16/2018   Procedure: RIGHT BELOW KNEE AMPUTATION;  Surgeon: Newt Minion, MD;  Location: [place];  Service: Orthopedics;  Laterality: Right;  . HAND SURGERY      There were no vitals filed for this visit.  Subjective Assessment - 10/03/19 0935    Subjective  He has been wearing prosthesis daily most of awake hours. He has been using Tegaderm as PT recommended. The wound looks like it's healing to him.    Pertinent History  Rt TTA, DM2, HTN, neuropathy, PVD, A-Fib,    Limitations  Walking;House hold activities    Patient Stated Goals  He wants to improve his balance & make sure he is using prosthesis correctly including yard work.    Currently in Pain?  No/denies           Prosthetic Training: PT checked residual limb. Wound has white eschar covering that appears to be healing. He was wearing Tegaderm correctly.  PT verbal cues on donning Tegaderm in morning prior to donning prosthesis and  doffing in evening upon removing prosthesis for day. Clean limb with soap & water prior to & after Tegaderm use.  Use of lotion at night & wiping off in morning prior to donning prosthesis.  Patient properly verbalizes liner cleaning & rotating his 2 liners ea day.               Medbridge Access Code: [email or web address] URL: [email or web address] Date: 10/03/2019 Prepared by: [facility] - Outpatient Rehab Neuro  Exercises Standing Weight Shift Side to Side - 1 x daily - 7 x weekly - 1 sets - 10 reps - 5 seconds hold   Tactile, visual & verbal cues on proprioceptive input with socket interface Standing Anterior Posterior Weight Shift - 1 x daily - 7 x weekly - 1 sets - 10 reps - 5 seconds hold  Tactile, visual & verbal cues on proprioceptive input with socket interface Alternating Punch with Resistance - 1 x daily - 7 x weekly - 1 sets - 10 reps - 5 seconds hold   Verbal & tactile cues on equal weight bearing & upright posture Standing Scapular Protraction with Resistance - 1 x daily - 7 x weekly - 10 reps - 1 sets - 5 seconds hold  Verbal & tactile cues on equal weight  bearing & upright posture Standing alternate rows with resistance - 1 x daily - 7 x weekly - 10 reps - 1 sets - 5 seconds hold  Verbal & tactile cues on equal weight bearing & upright posture Standing Row with Anchored Resistance - 1 x daily - 7 x weekly - 10 reps - 1 sets - 5 seconds hold  Verbal & tactile cues on equal weight bearing & upright posture Alternating elbow flexion with resistance - 1 x daily - 7 x weekly - 10 reps - 1 sets - 5 seconds hold  Verbal & tactile cues on equal weight bearing & upright posture Standing Bicep Curls with Resistance - 1 x daily - 7 x weekly - 10 reps - 1 sets - 5 seconds hold  Verbal & tactile cues on equal weight bearing & upright posture Standing Hip Flexion with Resistance - 1 x daily - 7 x weekly - 10 reps - 1 sets  BUE support, verbal cues on upright posture. Performed on  both LEs.  Standing Hip Extension with Resistance - 1 x daily - 7 x weekly - 10 reps - 1 sets  BUE support, verbal cues on upright posture. Performed on both LEs.  Standing Hip Adduction with Resistance - 1 x daily - 7 x weekly - 10 reps - 1 sets  BUE support, verbal cues on upright posture. Performed on both LEs.  Standing Hip Abduction with Theraband Resistance - 1 x daily - 7 x weekly - 10 reps - 1 sets  BUE support, verbal cues on upright posture. Performed on both LEs.          PT Education - 10/03/19 1015    Education Details  HEP Medbridge Access Code: [email or web address]    Person(s) Educated  Patient    Methods  Explanation;Demonstration;Tactile cues;Verbal cues;Handout    Comprehension  Verbalized understanding;Returned demonstration;Verbal cues required;Tactile cues required;Need further instruction       PT Short Term Goals - 09/26/19 2027      PT SHORT TERM GOAL #1   Title  Patient reports proper wound care & prosthesis cleaning with prosthesis wear.  (All STGs Target Date: 10/25/2019)    Time  1    Period  Months    Status  New    Target Date  10/25/19      PT SHORT TERM GOAL #2   Title  Sharlene Motts Balance >42/56    Time  1    Period  Months    Status  New    Target Date  10/25/19      PT SHORT TERM GOAL #3   Title  Patient ambulates with prosthesis only 500' with verbal cues on gait deviations & no loss of balance.    Time  1    Period  Months    Status  New    Target Date  10/25/19      PT SHORT TERM GOAL #4   Title  Patient negoatiates ramps & curbs with prosthesis only with supervision using proper techniques.    Time  1    Period  Months    Status  New    Target Date  10/25/19        PT Long Term Goals - 09/26/19 2020      PT LONG TERM GOAL #1   Title  Patient verbalizes & demonstrates proper prosthetic care to enable safe utilization.  (All LTGs Target Date:  12/19/2019)    Time  12  Period  Weeks    Status  New    Target Date  12/19/19      PT LONG  TERM GOAL #2   Title  Patient tolerates prostheis wear >90% of awake hours with no wound or residual limb issues.    Time  12    Period  Weeks    Status  New    Target Date  12/19/19      PT LONG TERM GOAL #3   Title  Berg Balance >/= 48/56 to indicate lower fall risk.    Time  12    Period  Weeks    Status  New    Target Date  12/19/19      PT LONG TERM GOAL #4   Title  Functional Gait Assessment >19/30 with prosthesis only to indicate lower fall risk.    Time  12    Period  Weeks    Status  New    Target Date  12/19/19      PT LONG TERM GOAL #5   Title  Patient ambulates >1000' outdoors including grass, ramps, curbs & stairs alternating with single rail independently.    Time  12    Period  Weeks    Status  New    Target Date  12/19/19      Additional Long Term Goals   Additional Long Term Goals  Yes      PT LONG TERM GOAL #6   Title  Patient demonstrates proper technique lifting / carrying 25# box, pushing / pulling & floor transfers to enable return to yard work tasks.    Time  12    Period  Weeks    Status  New    Target Date  12/19/19            Plan - 10/03/19 1715    Clinical Impression Statement  PT educated patient on HEP wtih cues for proprioception & standing resistive UE & LE.  He appears to understand exercises.  His wound appears to be healing with use of Tegaderm.    Personal Factors and Comorbidities  Comorbidity 3+;Time since onset of injury/illness/exacerbation    Comorbidities  Rt TTA, DM2, HTN, neuropathy, PVD, A-Fib,    Examination-Activity Limitations  Lift;Locomotion Level;Squat;Stairs;Stand;Transfers    Examination-Participation Restrictions  Community Activity;Yard Work    Stability/Clinical Decision Making  Stable/Uncomplicated    Rehab Potential  Good    PT Frequency  1x / week    PT Duration  12 weeks    PT Treatment/Interventions  ADLs/Self Care Home Management;DME Instruction;Gait training;Stair training;Functional mobility  training;Therapeutic activities;Therapeutic exercise;Balance training;Neuromuscular re-education;Patient/family education;Prosthetic Training;Vestibular    PT Next Visit Plan  check wound, review prosthetic care, check HEP for balance & proprioception, prosthetic gait including ramp, curb & stairs    PT Home Exercise Plan  Medbridge Access Code: [email or web address]    Consulted and Agree with Plan of Care  Patient       Patient will benefit from skilled therapeutic intervention in order to improve the following deficits and impairments:  Abnormal gait, Decreased balance, Decreased endurance, Decreased knowledge of use of DME, Decreased mobility, Decreased skin integrity, Postural dysfunction, Decreased scar mobility, Prosthetic Dependency  Visit Diagnosis: Other abnormalities of gait and mobility  Unsteadiness on feet  Abnormal posture  Weakness generalized  Wound of right lower extremity, initial encounter     Problem List Patient Active Problem List   Diagnosis Date Noted  . Postoperative pain   . AKI (  acute kidney injury) (HCC)   . Acute blood loss anemia   . Chronic diastolic congestive heart failure (HCC)   . Diabetic peripheral neuropathy (HCC)   . CKD (chronic kidney disease), stage II   . 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 Leukocytosis   . 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 Atrial fibrillation (HCC)   . Diabetic polyneuropathy associated with type 2 diabetes mellitus (HCC)   . PVD (peripheral vascular disease) (HCC)   . Critical lower limb ischemia   . Sepsis with acute renal failure (HCC) 11/13/2018  . Acute renal failure superimposed on stage 2 chronic kidney disease (HCC)   . 'light-for-dates' infant with signs of fetal malnutrition 10/17/2018  . DM (diabetes mellitus) (HCC) 07/02/2012  . Hypertension associated with diabetes (HCC) 07/02/2012    Vladimir Faster PT, DPT 10/03/2019, 5:19 PM  [facility] [address] [place], [place], [address] Phone: [phone]   Fax:  [phone]  Name: Joseph [name] MRN: [id number] Date of Birth: 28-May-1946

## 2019-10-10 ENCOUNTER — Ambulatory Visit (INDEPENDENT_AMBULATORY_CARE_PROVIDER_SITE_OTHER): Payer: Medicare Other | Admitting: Physical Therapy

## 2019-10-10 ENCOUNTER — Other Ambulatory Visit: Payer: Self-pay

## 2019-10-10 ENCOUNTER — Encounter: Payer: Self-pay | Admitting: Physical Therapy

## 2019-10-10 DIAGNOSIS — Z9181 History of falling: Secondary | ICD-10-CM

## 2019-10-10 DIAGNOSIS — R2681 Unsteadiness on feet: Secondary | ICD-10-CM

## 2019-10-10 DIAGNOSIS — S81801A Unspecified open wound, right lower leg, initial encounter: Secondary | ICD-10-CM

## 2019-10-10 DIAGNOSIS — R293 Abnormal posture: Secondary | ICD-10-CM

## 2019-10-10 DIAGNOSIS — R2689 Other abnormalities of gait and mobility: Secondary | ICD-10-CM | POA: Diagnosis not present

## 2019-10-10 DIAGNOSIS — R531 Weakness: Secondary | ICD-10-CM

## 2019-10-10 NOTE — Patient Instructions (Signed)
Sweating increases with an amputation. Your body is trying to regulate your temperature & without an extremity, you sweat more easily to cool off. Also prosthetic material like liners do not breath and add hot layers which causes even more sweating. With time your body typically will accommodate to prosthesis and your sweat level will come closer to level with amputation but not pre-amputation level.   You need to pat your limb & liner dry when you notice sweating. If you leave sweat trapped inside your liner, then it can result in a blister.   Signs of sweating in your liner: 1. You are sweating elsewhere on your body or you notice sweat running / dripping.  2. Take note of how high your liner comes up on your limb when you first put your liner on your limb. If you notice that your liner has slipped down, then you probably have sweat inside your liner. A good time to check for liner slippage is when toileting.  3. You feel air bubbles inside your liner. When you liner slips, then air is allowed in bottom. As you put weight on prosthesis, the air is burp or pushed out. 4. You feel something crawling or moving inside your liner. When sweat runs inside the closed system of liner, it often feels like a bug or something crawling inside your liner.  If any of above symptoms are noted, you need to remove your prosthesis & liner to pat your limb & liner dry. This is permanent need as leaving sweat or water trapped can result in a blister or wound.  

## 2019-10-10 NOTE — Therapy (Signed)
[facility] [address] [place], [place], [address] Phone: [phone]   Fax:  [phone]  Physical Therapy Treatment  Patient Details  Name: Joseph [name] MRN: [id number] Date of Birth: April 10, 1946 Referring Provider (PT): Meridee Score, MD   Encounter Date: 10/10/2019  PT End of Session - 10/10/19 0855    Visit Number  3    Number of Visits  13    Date for PT Re-Evaluation  12/25/19    Authorization Type  Medicare    PT Start Time  0852    PT Stop Time  0930    PT Time Calculation (min)  38 min    Equipment Utilized During Treatment  Gait belt    Activity Tolerance  Patient tolerated treatment well    Behavior During Therapy  [facility] for tasks assessed/performed       Past Medical History:  Diagnosis Date  . Cellulitis of right lower extremity   . Diabetes mellitus type 2 in nonobese (HCC)   . Gangrene of right foot ([place])   . Hypertension   . Open wound of right foot     Past Surgical History:  Procedure Laterality Date  . AMPUTATION Right 11/16/2018   Procedure: RIGHT BELOW KNEE AMPUTATION;  Surgeon: Newt Minion, MD;  Location: [place];  Service: Orthopedics;  Laterality: Right;  . HAND SURGERY      There were no vitals filed for this visit.  Subjective Assessment - 10/10/19 0856    Subjective  He did exercises without issues. He is cleaning liner & switching out as PT recommended.    Pertinent History  Rt TTA, DM2, HTN, neuropathy, PVD, A-Fib,    Limitations  Walking;House hold activities    Patient Stated Goals  He wants to improve his balance & make sure he is using prosthesis correctly including yard work.    Currently in Pain?  No/denies                        [facility] Adult PT Treatment/Exercise - 10/10/19 0848      Transfers   Transfers  Sit to Stand;Stand to Sit    Sit to Stand  6: Modified independent (Device/Increase time);With upper extremity assist;With armrests;From chair/3-in-1    Stand to Sit  6:  Modified independent (Device/Increase time);With upper extremity assist;With armrests;To chair/3-in-1      Ambulation/Gait   Ambulation/Gait  Yes    Ambulation/Gait Assistance  5: Supervision    Ambulation Distance (Feet)  300 Feet    Assistive device  Prosthesis;None    Gait Pattern  Step-through pattern;Decreased arm swing - right;Decreased step length - left;Decreased stance time - right;Abducted- right    Gait velocity  --    Ramp  --    Curb  --      Posture/Postural Control   Posture/Postural Control  --    Postural Limitations  --      Knee/Hip Exercises: Machines for Strengthening   Total Gym Leg Press  Shuttle leg press BLEs 131# 20 reps with cues on control & speed with full range      Prosthetics   Prosthetic Care Comments   PT reviewed wound care with use of Tegaderm with prosthesis wear and clean shrinker with no wound covering overnight when prosthesis is off limb.  Use of antiperspirant to control sweat & use of lotion prn dry skin at night.  Signs of sweating & need to dry limb/liner. Donning prosthesis with long  pants.      Current prosthetic wear tolerance (days/week)   daily    Current prosthetic wear tolerance (#hours/day)   reports most of awake hours    Current prosthetic weight-bearing tolerance (hours/day)   Patient tolerated standing & gait activities >10 minutes without limb pain or discomfort    Edema  non-pitting    Residual limb condition   medial wound has healthy epithelial covering with no drainage.  lateral dry scab present.     Education Provided  Skin check;Residual limb care;Prosthetic cleaning;Correct ply sock adjustment;Proper Donning;Proper wear schedule/adjustment;Other (comment)   see prosthetic care comments   Person(s) Educated  Patient    Education Method  Explanation;Demonstration;Tactile cues;Verbal cues;Handout    Education Method  Verbalized understanding;Returned demonstration;Needs further instruction;Verbal cues required    Donning  Prosthesis  Supervision               PT Short Term Goals - 10/10/19 1053      PT SHORT TERM GOAL #1   Title  Patient reports proper wound care & prosthesis cleaning with prosthesis wear.  (All STGs Target Date: 10/25/2019)    Time  1    Period  Months    Status  On-going    Target Date  10/25/19      PT SHORT TERM GOAL #2   Title  Sharlene Motts Balance >42/56    Time  1    Period  Months    Status  On-going    Target Date  10/25/19      PT SHORT TERM GOAL #3   Title  Patient ambulates with prosthesis only 500' with verbal cues on gait deviations & no loss of balance.    Time  1    Period  Months    Status  On-going    Target Date  10/25/19      PT SHORT TERM GOAL #4   Title  Patient negoatiates ramps & curbs with prosthesis only with supervision using proper techniques.    Time  1    Period  Months    Status  On-going    Target Date  10/25/19      PT SHORT TERM GOAL #5   Status  On-going        PT Long Term Goals - 10/10/19 1053      PT LONG TERM GOAL #1   Title  Patient verbalizes & demonstrates proper prosthetic care to enable safe utilization.  (All LTGs Target Date:  12/19/2019)    Time  12    Period  Weeks    Status  On-going    Target Date  12/19/19      PT LONG TERM GOAL #2   Title  Patient tolerates prostheis wear >90% of awake hours with no wound or residual limb issues.    Time  12    Period  Weeks    Status  On-going    Target Date  12/19/19      PT LONG TERM GOAL #3   Title  Berg Balance >/= 48/56 to indicate lower fall risk.    Time  12    Period  Weeks    Status  On-going    Target Date  12/19/19      PT LONG TERM GOAL #4   Title  Functional Gait Assessment >19/30 with prosthesis only to indicate lower fall risk.    Time  12    Period  Weeks    Status  On-going  Target Date  12/19/19      PT LONG TERM GOAL #5   Title  Patient ambulates >1000' outdoors including grass, ramps, curbs & stairs alternating with single rail independently.     Time  12    Period  Weeks    Status  On-going    Target Date  12/19/19      PT LONG TERM GOAL #6   Title  Patient demonstrates proper technique lifting / carrying 25# box, pushing / pulling & floor transfers to enable return to yard work tasks.    Time  12    Period  Weeks    Status  On-going    Target Date  12/19/19            Plan - 10/10/19 0856    Clinical Impression Statement  PT educated patient on sweat management, wound care and adjusting ply socks and he appears to understand.  He reports understanding & compliance with HEP.  PT initiated leg strengthening exercises.    Personal Factors and Comorbidities  Comorbidity 3+;Time since onset of injury/illness/exacerbation    Comorbidities  Rt TTA, DM2, HTN, neuropathy, PVD, A-Fib,    Examination-Activity Limitations  Lift;Locomotion Level;Squat;Stairs;Stand;Transfers    Examination-Participation Restrictions  Community Activity;Yard Work    Stability/Clinical Decision Making  Stable/Uncomplicated    Rehab Potential  Good    PT Frequency  1x / week    PT Duration  12 weeks    PT Treatment/Interventions  ADLs/Self Care Home Management;DME Instruction;Gait training;Stair training;Functional mobility training;Therapeutic activities;Therapeutic exercise;Balance training;Neuromuscular re-education;Patient/family education;Prosthetic Training;Vestibular    PT Next Visit Plan  check wound, review prosthetic care, prosthetic gait including ramp, curb & stairs, instruct in lifting, pushing/pulling tasks    PT Home Exercise Plan  Medbridge Access Code: [email or web address]    Consulted and Agree with Plan of Care  Patient       Patient will benefit from skilled therapeutic intervention in order to improve the following deficits and impairments:  Abnormal gait, Decreased balance, Decreased endurance, Decreased knowledge of use of DME, Decreased mobility, Decreased skin integrity, Postural dysfunction, Decreased scar mobility, Prosthetic  Dependency  Visit Diagnosis: Other abnormalities of gait and mobility  Unsteadiness on feet  Abnormal posture  Weakness generalized  History of fall  Wound of right lower extremity, initial encounter     Problem List Patient Active Problem List   Diagnosis Date Noted  . Postoperative pain   . AKI (acute kidney injury) (HCC)   . Acute blood loss anemia   . Chronic diastolic congestive heart failure (HCC)   . Diabetic peripheral neuropathy (HCC)   . CKD (chronic kidney disease), stage II   . 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 Leukocytosis   . 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 Atrial fibrillation (HCC)   . Diabetic polyneuropathy associated with type 2 diabetes mellitus (HCC)   . PVD (peripheral vascular disease) (HCC)   . Critical lower limb ischemia   . Sepsis with acute renal failure (HCC) 11/13/2018  . Acute renal failure superimposed on stage 2 chronic kidney disease (HCC)   . 'light-for-dates' infant with signs of fetal malnutrition 10/17/2018  . DM (diabetes mellitus) (HCC) 07/02/2012  . Hypertension associated with diabetes (HCC) 07/02/2012    Vladimir Faster  PT, DPT 10/10/2019, 10:57 AM  [facility] Therapy [address] [place], [place], [address] Phone: [phone]   Fax:  [phone]  Name: Joseph [name] MRN: [id number] Date of Birth: 1945-12-24

## 2019-10-17 ENCOUNTER — Encounter: Payer: Medicare Other | Admitting: Physical Therapy

## 2019-10-24 ENCOUNTER — Encounter: Payer: Medicare Other | Admitting: Physical Therapy

## 2019-10-31 ENCOUNTER — Encounter: Payer: Self-pay | Admitting: Physical Therapy

## 2019-10-31 ENCOUNTER — Encounter: Payer: Medicare Other | Admitting: Physical Therapy

## 2019-10-31 NOTE — Therapy (Signed)
[facility] Physical Therapy [address] [place], [place], [address] Phone: [phone]   Fax:  [phone]  Patient Details  Name: Joseph [name] MRN: [id number] Date of Birth: 14-Dec-1945 Referring Provider:  Meridee Score, MD  Encounter Date: 10/31/2019   PHYSICAL THERAPY DISCHARGE SUMMARY  Visits from Start of Care: 3  09/26/2019 - 10/10/2019  Current functional level related to goals / functional outcomes: PT unable to recheck goals as patient did not return to PT.    Remaining deficits: Unknown as did not return to PT   Education / Equipment: Initial prosthetic care  Plan: Patient agrees to discharge.  Patient goals were not met. Patient is being discharged due to not returning since the last visit.  With multiple no-shows??       Jamey Reas PT, DPT 10/31/2019, 9:08 AM  [facility] Physical Therapy [address] [place], [place], [address] Phone: [phone]   Fax:  [phone]

## 2019-11-01 ENCOUNTER — Ambulatory Visit: Payer: Medicare Other | Admitting: Family

## 2019-11-01 MED FILL — glipiZIDE 5 MG TABS: 5 | 30 days supply | Qty: 30 | Fill #2

## 2019-11-05 ENCOUNTER — Encounter: Payer: Medicare Other | Admitting: Physical Therapy

## 2019-11-12 ENCOUNTER — Encounter: Payer: Medicare Other | Admitting: Physical Therapy

## 2019-11-21 ENCOUNTER — Encounter: Payer: Medicare Other | Admitting: Physical Therapy

## 2019-11-28 ENCOUNTER — Encounter: Payer: Medicare Other | Admitting: Physical Therapy

## 2019-12-05 ENCOUNTER — Encounter: Payer: Medicare Other | Admitting: Physical Therapy

## 2019-12-11 ENCOUNTER — Other Ambulatory Visit: Payer: Self-pay | Admitting: Family Medicine

## 2019-12-11 DIAGNOSIS — E1165 Type 2 diabetes mellitus with hyperglycemia: Secondary | ICD-10-CM

## 2019-12-11 MED FILL — glipiZIDE 5 MG TABS: 5 | 30 days supply | Qty: 60 | Fill #0

## 2019-12-11 NOTE — Telephone Encounter (Signed)
Courtesy 30 refill provided per protocol. Left VM for patient to return call to schedule OV. Last OV 05/17/19.

## 2019-12-12 ENCOUNTER — Encounter: Payer: Medicare Other | Admitting: Physical Therapy

## 2019-12-19 ENCOUNTER — Encounter: Payer: Medicare Other | Admitting: Physical Therapy

## 2019-12-24 ENCOUNTER — Encounter: Payer: Medicare Other | Admitting: Physical Therapy

## 2020-01-15 ENCOUNTER — Ambulatory Visit: Payer: Medicare Other | Admitting: Physician Assistant

## 2020-02-06 ENCOUNTER — Encounter: Payer: Self-pay | Admitting: Physician Assistant

## 2020-02-06 ENCOUNTER — Other Ambulatory Visit: Payer: Self-pay

## 2020-02-06 ENCOUNTER — Ambulatory Visit: Payer: Medicare Other | Attending: Physician Assistant | Admitting: Physician Assistant

## 2020-02-06 ENCOUNTER — Other Ambulatory Visit: Payer: Self-pay | Admitting: Physician Assistant

## 2020-02-06 VITALS — BP 183/95 | HR 64 | Temp 97.7°F | Ht 76.0 in | Wt 228.0 lb

## 2020-02-06 DIAGNOSIS — Z89511 Acquired absence of right leg below knee: Secondary | ICD-10-CM | POA: Diagnosis not present

## 2020-02-06 DIAGNOSIS — E1165 Type 2 diabetes mellitus with hyperglycemia: Secondary | ICD-10-CM

## 2020-02-06 DIAGNOSIS — Z79899 Other long term (current) drug therapy: Secondary | ICD-10-CM | POA: Diagnosis not present

## 2020-02-06 DIAGNOSIS — I10 Essential (primary) hypertension: Secondary | ICD-10-CM | POA: Diagnosis not present

## 2020-02-06 DIAGNOSIS — Z7984 Long term (current) use of oral hypoglycemic drugs: Secondary | ICD-10-CM | POA: Insufficient documentation

## 2020-02-06 DIAGNOSIS — Z791 Long term (current) use of non-steroidal anti-inflammatories (NSAID): Secondary | ICD-10-CM | POA: Diagnosis not present

## 2020-02-06 LAB — POCT GLYCOSYLATED HEMOGLOBIN (HGB A1C): Hemoglobin A1C: 9.8 % — AB (ref 4.0–5.6)

## 2020-02-06 LAB — GLUCOSE, POCT (MANUAL RESULT ENTRY): POC Glucose: 205 mg/dL — AB (ref 70–99)

## 2020-02-06 MED ORDER — GLIPIZIDE 10 MG PO TABS: 10.0000 mg | ORAL_TABLET | Freq: Two times a day (BID) | ORAL | 3 refills | Status: DC

## 2020-02-06 MED ORDER — HYDROCHLOROTHIAZIDE 12.5 MG PO CAPS
12.5000 mg | ORAL_CAPSULE | Freq: Every day | ORAL | 1 refills | Status: DC
  Filled 2020-11-26: qty 30, 30d supply, fill #0

## 2020-02-06 MED ORDER — AMLODIPINE BESYLATE 10 MG PO TABS: 10.0000 mg | ORAL_TABLET | Freq: Every day | ORAL | 3 refills | Status: DC

## 2020-02-06 MED ORDER — ATORVASTATIN CALCIUM 10 MG PO TABS: 10.0000 mg | ORAL_TABLET | Freq: Every day | ORAL | 3 refills | Status: DC

## 2020-02-06 MED FILL — AMLODIPINE BESYLATE 10 MG T: 10 | 30 days supply | Qty: 30 | Fill #0

## 2020-02-06 MED FILL — glipiZIDE 10 MG TABS: 10 | 30 days supply | Qty: 60 | Fill #0

## 2020-02-06 MED FILL — ATORVASTATIN 10 MG TABLET: 10 | 30 days supply | Qty: 30 | Fill #0

## 2020-02-06 MED FILL — HYDROCHLOROTHIAZIDE 12.5 MG: 12.5 | 30 days supply | Qty: 30 | Fill #0

## 2020-02-06 NOTE — Progress Notes (Signed)
Joseph [name], is a 74 y.o. male  WCB:[id number]  OHY:[id number]  DOB - 1946-03-14  Subjective:  Chief Complaint and HPI: Joseph [name] is a 74 y.o. male here today only taking glipizide and no other meds.  Says cardiologist took him off amiodarone and lasix.  Denies problems with swelling.  Doing well with prosthesis RLE.  Does not check blood sugars but is compliant with glipizide.  No palpitations SOB/CP.     ROS:   Constitutional:  No f/c, No night sweats, No unexplained weight loss. EENT:  No vision changes, No blurry vision, No hearing changes. No mouth, throat, or ear problems.  Respiratory: No cough, No SOB Cardiac: No CP, no palpitations GI:  No abd pain, No N/V/D. GU: No Urinary s/sx Musculoskeletal: No joint pain Neuro: No headache, no dizziness, no motor weakness.  Skin: No rash Endocrine:  No polydipsia. No polyuria.  Psych: Denies SI/HI  No problems updated.  ALLERGIES: No Known Allergies  PAST MEDICAL HISTORY: Past Medical History:  Diagnosis Date  . Cellulitis of right lower extremity   . Diabetes mellitus type 2 in nonobese (HCC)   . Gangrene of right foot ([place])   . Hypertension   . Open wound of right foot     MEDICATIONS AT HOME: Prior to Admission medications   Medication Sig Start Date End Date Taking? Authorizing Provider  Accu-Chek FastClix Lancets MISC Use to check blood sugars three times per day 05/17/19  Yes Fulp, Cammie, MD  Blood Glucose Monitoring Suppl (ACCU-CHEK GUIDE ME) w/Device KIT 1 kit by Does not apply route 3 (three) times daily. To check blood sugars 05/17/19  Yes Fulp, Cammie, MD  diclofenac sodium (VOLTAREN) 1 % GEL Apply 2 g topically 3 (three) times daily. 12/03/18  Yes Angiulli, Lavon Paganini, PA-C  furosemide (LASIX) 40 MG tablet Take 1 tablet (40 mg total) by mouth daily. 12/24/18  Yes Rayburn, Neta Mends, PA-C  gabapentin (NEURONTIN) 300 MG capsule Take 1 capsule (300 mg total) by mouth 3 (three) times daily. 12/24/18  Yes  Rayburn, Neta Mends, PA-C  glucose blood (ACCU-CHEK GUIDE) test strip Use as instructed 05/17/19  Yes Fulp, Cammie, MD  Lancets Misc. (ACCU-CHEK FASTCLIX LANCET) KIT Use when checking blood sugars three times daily 05/17/19  Yes Fulp, Cammie, MD  Multiple Vitamins-Minerals (ONE-A-DAY MENS 50+ ADVANTAGE) TABS Take 1 tablet by mouth daily with breakfast.   Yes [provider]  oxyCODONE (OXY IR/ROXICODONE) 5 MG immediate release tablet Take 1 tablet (5 mg total) by mouth 2 (two) times daily as needed for severe pain (pain score 7-10). 03/19/19  Yes Dondra Prader R, NP  polyethylene glycol (MIRALAX / GLYCOLAX) 17 g packet Take 17 g by mouth daily as needed for mild constipation. 11/20/18  Yes Georgette Shell, MD  amLODipine (NORVASC) 10 MG tablet Take 1 tablet (10 mg total) by mouth daily. 02/06/20   Argentina Donovan, PA-C  atorvastatin (LIPITOR) 10 MG tablet Take 1 tablet (10 mg total) by mouth daily. In the evening 02/06/20   Argentina Donovan, PA-C  glipiZIDE (GLUCOTROL) 10 MG tablet Take 1 tablet (10 mg total) by mouth 2 (two) times daily before a meal. 02/06/20   Andrena Margerum, Dionne Bucy, PA-C  hydrochlorothiazide (MICROZIDE) 12.5 MG capsule Take 1 capsule (12.5 mg total) by mouth daily. 02/06/20   Argentina Donovan, PA-C     Objective:  EXAM:   Vitals:   02/06/20 0938  BP: (!) 183/95  Pulse: 64  Temp: 97.7 F (  36.5 C)  TempSrc: Temporal  SpO2: 100%  Weight: 228 lb (103.4 kg)  Height: _0  (1.93 m)    General appearance : A&OX3. NAD. Non-toxic-appearing HEENT: Atraumatic and Normocephalic.  PERRLA. EOM intact.  Chest/Lungs:  Breathing-non-labored, Good air entry bilaterally, breath sounds normal without rales, rhonchi, or wheezing  CVS: S1 S2 regular, no murmurs, gallops, rubs  Extremities: Bilateral Lower Ext shows no edema, both legs(R leg with prosthesis) are warm to touch with = pulse throughout Neurology:  CN II-XII grossly intact, Non focal.   Psych:  TP linear. J/I WNL.  Normal speech. Appropriate eye contact and affect.  Skin:  No Rash  Data Review Lab Results  Component Value Date   HGBA1C 9.8 (A) 02/06/2020   HGBA1C 11.3 (H) 05/06/2019   HGBA1C 6.4 (H) 11/18/2018     Assessment & Plan   1. Type 2 diabetes mellitus with hyperglycemia, without long-term current use of insulin (HCC) Uncontrolled.  Increase dose glipizide.  Work on diet and exercise.  See AVS - Glucose (CBG) - HgB A1c - Comprehensive metabolic panel - CBC with Differential/Platelet - Lipid panel - atorvastatin (LIPITOR) 10 MG tablet; Take 1 tablet (10 mg total) by mouth daily. In the evening  Dispense: 90 tablet; Refill: 3 - glipiZIDE (GLUCOTROL) 10 MG tablet; Take 1 tablet (10 mg total) by mouth 2 (two) times daily before a meal.  Dispense: 60 tablet; Refill: 3  2. Hypertension, unspecified type Make appt with his cardiologist.  See avs - Comprehensive metabolic panel - CBC with Differential/Platelet - amLODipine (NORVASC) 10 MG tablet; Take 1 tablet (10 mg total) by mouth daily.  Dispense: 90 tablet; Refill: 3 - hydrochlorothiazide (MICROZIDE) 12.5 MG capsule; Take 1 capsule (12.5 mg total) by mouth daily.  Dispense: 90 capsule; Refill: 1  3. Right below-knee amputee [facility]) Doing well with prosthesis   Patient have been counseled extensively about nutrition and exercise  Return for 3 weeks with Lurena Joiner for BP and DM then PCP in 3 months.  The patient was given clear instructions to go to ER or return to medical center if symptoms don't improve, worsen or new problems develop. The patient verbalized understanding. The patient was told to call to get lab results if they haven't heard anything in the next week.     Freeman Caldron, PA-C [facility] and [place], [place]   02/06/2020, 10:04 AMPatient ID: Joseph [name], male   DOB: 02-29-1946, 74 y.o.   MRN: [id number]

## 2020-02-06 NOTE — Patient Instructions (Signed)
Check blood sugars fasting and at bedtime.  Check your blood pressure at least 2-3 times weekly and record.  Have these numbers available for next appt.

## 2020-02-07 LAB — CBC WITH DIFFERENTIAL/PLATELET
Basophils Absolute: 0 10*3/uL (ref 0.0–0.2)
Basos: 1 %
EOS (ABSOLUTE): 0.1 10*3/uL (ref 0.0–0.4)
Eos: 2 %
Hematocrit: 35.3 % — ABNORMAL LOW (ref 37.5–51.0)
Hemoglobin: 11.9 g/dL — ABNORMAL LOW (ref 13.0–17.7)
Immature Grans (Abs): 0 10*3/uL (ref 0.0–0.1)
Immature Granulocytes: 0 %
Lymphocytes Absolute: 3.8 10*3/uL — ABNORMAL HIGH (ref 0.7–3.1)
Lymphs: 45 %
MCH: 30.4 pg (ref 26.6–33.0)
MCHC: 33.7 g/dL (ref 31.5–35.7)
MCV: 90 fL (ref 79–97)
Monocytes Absolute: 0.6 10*3/uL (ref 0.1–0.9)
Monocytes: 7 %
Neutrophils Absolute: 3.8 10*3/uL (ref 1.4–7.0)
Neutrophils: 45 %
Platelets: 202 10*3/uL (ref 150–450)
RBC: 3.91 x10E6/uL — ABNORMAL LOW (ref 4.14–5.80)
RDW: 13.1 % (ref 11.6–15.4)
WBC: 8.3 10*3/uL (ref 3.4–10.8)

## 2020-02-07 LAB — COMPREHENSIVE METABOLIC PANEL
ALT: 18 IU/L (ref 0–44)
AST: 22 IU/L (ref 0–40)
Albumin/Globulin Ratio: 1.4 (ref 1.2–2.2)
Albumin: 4.3 g/dL (ref 3.7–4.7)
Alkaline Phosphatase: 51 IU/L (ref 48–121)
BUN/Creatinine Ratio: 11 (ref 10–24)
BUN: 16 mg/dL (ref 8–27)
Bilirubin Total: 0.4 mg/dL (ref 0.0–1.2)
CO2: 24 mmol/L (ref 20–29)
Calcium: 9.7 mg/dL (ref 8.6–10.2)
Chloride: 105 mmol/L (ref 96–106)
Creatinine, Ser: 1.4 mg/dL — ABNORMAL HIGH (ref 0.76–1.27)
GFR calc Af Amer: 58 mL/min/{1.73_m2} — ABNORMAL LOW (ref >59)
GFR calc non Af Amer: 50 mL/min/{1.73_m2} — ABNORMAL LOW (ref >59)
Globulin, Total: 3 g/dL (ref 1.5–4.5)
Glucose: 185 mg/dL — ABNORMAL HIGH (ref 65–99)
Potassium: 4.5 mmol/L (ref 3.5–5.2)
Sodium: 141 mmol/L (ref 134–144)
Total Protein: 7.3 g/dL (ref 6.0–8.5)

## 2020-02-07 LAB — LIPID PANEL
Chol/HDL Ratio: 3.2 ratio (ref 0.0–5.0)
Cholesterol, Total: 162 mg/dL (ref 100–199)
HDL: 51 mg/dL (ref >39)
LDL Chol Calc (NIH): 95 mg/dL (ref 0–99)
Triglycerides: 85 mg/dL (ref 0–149)
VLDL Cholesterol Cal: 16 mg/dL (ref 5–40)

## 2020-02-26 NOTE — Progress Notes (Unsigned)
A1C = Check Clinic BG Review medications and adherence (timing of meds, etc.)  Ate or drank anything prior to visit today? At home BGs?  Marland Kitchen Highs . Lows  Hyperglycemia sx (nocturia, neuropathy, visual changes, foot exams) Hypoglycemia symptoms (dizziness, shaky, sweating, hungry, confusion) Diet Exercise  Compliance? Took meds this morning? When do you take your meds? Dizziness, headaches, blurred vision? History of swelling? Check Clinic BP? Home BP logs? If no logs, bring to next visit w/ BP cuff Go over BP goals Additional BP therapy if needed .  Diet??  Exercise??      S:    PCP: Fulp  No chief complaint on file.   Patient arrives ***. Presents for diabetes and hypertension evaluation, education, and management Patient was referred and last seen by Marylene Land on 02/06/20. At that time, glipizide was increased and amlodipine and hydrochlorothiazide were initiated.   Patient reports Diabetes was diagnosed in ***.   Family/Social History: ***  Insurance coverage/medication affordability: ***  Medication adherence reported *** .   Current diabetes medications include: glipizide 10 mg BIDWC Current hypertension medications include: amlodipine 10 mg daily, hydrochlorothiazide 12.5 mg daily Current hyperlipidemia medications include: atorvastatin 10 mg daily  Patient {Actions; denies-reports:120008} hypoglycemic events.  Patient reported dietary habits: Eats *** meals/day Breakfast:*** Lunch:*** Dinner:*** Snacks:*** Drinks:***  Patient-reported exercise habits: ***   Patient {Actions; denies-reports:120008} nocturia (nighttime urination).  Patient {Actions; denies-reports:120008} neuropathy (nerve pain). Patient {Actions; denies-reports:120008} visual changes. Patient {Actions; denies-reports:120008} self foot exams.     O:  Physical Exam   ROS   Lab Results  Component Value Date   HGBA1C 9.8 (A) 02/06/2020   There were no vitals filed for this  visit.  Lipid Panel     Component Value Date/Time   CHOL 162 02/06/2020 1018   TRIG 85 02/06/2020 1018   HDL 51 02/06/2020 1018   CHOLHDL 3.2 02/06/2020 1018   CHOLHDL 4.7 11/14/2018 0006   VLDL 9 11/14/2018 0006   LDLCALC 95 02/06/2020 1018    Home fasting blood sugars: ***  2 hour post-meal/random blood sugars: ***.   Clinical Atherosclerotic Cardiovascular Disease (ASCVD): {YES/NO:21197} The 10-year ASCVD risk score Denman George DC Jr., et al., 2013) is: 54.9%   Values used to calculate the score:     Age: 74 years     Sex: Male     Is Non-Hispanic African American: Yes     Diabetic: Yes     Tobacco smoker: No     Systolic Blood Pressure: 183 mmHg     Is BP treated: Yes     HDL Cholesterol: 51 mg/dL     Total Cholesterol: 162 mg/dL    A/P: Diabetes longstanding*** currently ***. Patient is *** able to verbalize appropriate hypoglycemia management plan. Medication adherence appears ***. Control is suboptimal due to ***. -{Meds adjust:18428} basal insulin *** (insulin ***). Patient will continue to titrate 1 unit every *** days if fasting blood sugar > 100mg /dl until fasting blood sugars reach goal or next visit.  -{Meds adjust:18428}  rapid insulin *** (insulin ***) to ***.  -{Meds adjust:18428} GLP-1 *** (generic name***) to ***.  -{Meds adjust:18428} SGLT2-I *** (generic name***) to ***. Counseled on sick day rules for ***. -Extensively discussed pathophysiology of diabetes, recommended lifestyle interventions, dietary effects on blood sugar control -Counseled on s/sx of and management of hypoglycemia -Next A1C anticipated ***.   ASCVD risk - primary***secondary prevention in patient with diabetes. Last LDL {Is/is not:9024} controlled. ASCVD risk score {Is/is not:9024} >20%  - {  Desc; low/moderate/high:110033} intensity statin indicated. Aspirin {Is/is not:9024} indicated.  -{Meds adjust:18428} aspirin *** mg  -{Meds adjust:18428} ***statin *** mg.   Hypertension  longstanding*** currently ***.  Blood pressure goal = *** mmHg. Medication adherence ***.  Blood pressure control is suboptimal due to ***. -***  Written patient instructions provided.  Total time in face to face counseling *** minutes.   Follow up Pharmacist/PCP*** Clinic Visit in ***.

## 2020-02-27 ENCOUNTER — Ambulatory Visit: Payer: Medicare Other | Admitting: Pharmacist

## 2020-03-27 MED FILL — glipiZIDE 10 MG TABS: 10 | 30 days supply | Qty: 60 | Fill #1

## 2020-03-30 MED FILL — TRUE METRIX GLUCOSE TEST ST: 33 days supply | Qty: 100 | Fill #1

## 2020-04-15 MED FILL — TRUE METRIX TEST STRIP: 33 days supply | Qty: 100 | Fill #1

## 2020-05-05 MED FILL — glipiZIDE 10 MG TABS: 10 | 30 days supply | Qty: 60 | Fill #2

## 2020-05-13 ENCOUNTER — Ambulatory Visit: Payer: Medicare Other | Admitting: Family Medicine

## 2020-06-02 MED FILL — glipiZIDE 10 MG TABS: 10 | 30 days supply | Qty: 60 | Fill #3

## 2020-06-17 ENCOUNTER — Ambulatory Visit: Payer: Medicare Other | Admitting: Nurse Practitioner

## 2020-07-09 ENCOUNTER — Other Ambulatory Visit: Payer: Self-pay | Admitting: Physician Assistant

## 2020-07-09 DIAGNOSIS — E1165 Type 2 diabetes mellitus with hyperglycemia: Secondary | ICD-10-CM

## 2020-07-10 MED FILL — glipiZIDE 10 MG TABS: 10 | 30 days supply | Qty: 60 | Fill #0

## 2020-08-18 MED FILL — glipiZIDE 10 MG TABS: 10 | 30 days supply | Qty: 60 | Fill #1

## 2020-09-30 MED FILL — Glipizide Tab 10 MG: ORAL | 30 days supply | Qty: 60 | Fill #0 | Status: AC

## 2020-10-01 ENCOUNTER — Other Ambulatory Visit: Payer: Self-pay

## 2020-10-02 ENCOUNTER — Other Ambulatory Visit: Payer: Self-pay

## 2020-10-02 ENCOUNTER — Other Ambulatory Visit: Payer: Self-pay | Admitting: Physician Assistant

## 2020-10-02 DIAGNOSIS — I10 Essential (primary) hypertension: Secondary | ICD-10-CM

## 2020-10-02 NOTE — Telephone Encounter (Signed)
Requested medication (s) are due for refill today: no  Requested medication (s) are on the active medication list: yes  Future visit scheduled: no  Notes to clinic:  overdue for follow up appointment Patient no showed last 2 appt    Requested Prescriptions  Pending Prescriptions Disp Refills   amLODipine (NORVASC) 10 MG tablet 90 tablet 3    Sig: Take 1 tablet (10 mg total) by mouth daily.      Cardiovascular:  Calcium Channel Blockers Failed - 10/02/2020  1:01 PM      Failed - Last BP in normal range    BP Readings from Last 1 Encounters:  02/06/20 (!) 183/95          Failed - Valid encounter within last 6 months    Recent Outpatient Visits           7 months ago Type 2 diabetes mellitus with hyperglycemia, without long-term current use of insulin [facility])   [facility] [place], [place], [place]   1 year ago Stage 3b chronic kidney disease   [facility] Fulp, [place], [place]   1 year ago Diabetic polyneuropathy associated with type 2 diabetes mellitus (HCC)   [facility] Fulp, [place], [place]   1 year ago Type 2 diabetes mellitus with hyperglycemia, unspecified whether long term insulin use [facility])   [facility] [place], [place], [place]   7 years ago DM (diabetes mellitus)   [facility] at [facility], Tessa Lerner, MD

## 2020-10-07 ENCOUNTER — Other Ambulatory Visit: Payer: Self-pay

## 2020-10-19 ENCOUNTER — Other Ambulatory Visit: Payer: Self-pay | Admitting: *Deleted

## 2020-10-19 NOTE — Patient Outreach (Signed)
[facility]) Care Management  10/19/2020  Montenegro Macqueen [birth year] [id number]   Encounter opened in error  Dibble L. [name], RN, BSN, CCM [facility] Telephonic Care Management Care Coordinator Office number [phone] Main [facility] number [phone] Fax number [phone]

## 2020-10-20 ENCOUNTER — Other Ambulatory Visit: Payer: Self-pay | Admitting: *Deleted

## 2020-10-20 ENCOUNTER — Encounter: Payer: Self-pay | Admitting: *Deleted

## 2020-10-20 DIAGNOSIS — Z89511 Acquired absence of right leg below knee: Secondary | ICD-10-CM

## 2020-10-20 DIAGNOSIS — E1142 Type 2 diabetes mellitus with diabetic polyneuropathy: Secondary | ICD-10-CM

## 2020-10-20 DIAGNOSIS — I739 Peripheral vascular disease, unspecified: Secondary | ICD-10-CM

## 2020-10-20 NOTE — Patient Outreach (Signed)
[facility]) Care Management  10/20/2020  Joseph [name] 02-04-1946 [id number]   [facility] Telephone Assessment/Screen for complex care referral  Referral Date: 10/08/20 Referral Reason: to be placed in complex, chronic or disease mgmt. Insurance: medicare  Last [facility] admissions   11/20/18 to 12/04/18 In patient rehab right below knee amputee, anemia, CHF, CKD  11/13/18 to 11/20/18 sepsis with acute renal failure, diabetes, HTN, acute renal failure superimposed on stage 2 chronic kidney disease   Outreach attempt # 1 successful to [phone] Patient is able to verify HIPAA, DOB and address Reviewed and addressed referral to [facility] with patient Consent: [facility] RN CM reviewed [facility] services with patient. Patient gave verbal consent for services [facility] telephonic RN CM.    Patient goal interventions Make pcp and prosthetic appointments Review materials/resources being sent to him via mail   [facility] RN CM care coordination Referral to [facility] care guide for community, financial resource and meal on wheels Requested [employer] [employer] CMA send pt a BP cuff in the mail  [facility] RN CM disease management/education  DM diet, exchange diet, differences in home health & personal care services related to cost, visits      Hypertension (HTN)/congestive Heart Failure (CHF)/Atrial fibrillation Joseph [name] reports he is aware that certain food choices causes his blood pressure (BP) to be elevated like salted pork and black coffee He otherwise reports his HTN home care is manageable He reports generally exercising with yard work  He drives 1-2 times per week to run minimal errands  He walks 95% with his prosthetic daily   Diabetes  HgA1c 9.8 02/06/20 Was doing upper body exercises and new HH PT exercise lift weights  Got right prosthetic- wears it 95% time except when resting healed right BKA stump Slow healer, likes to stay in shape, always a fighter    Reviewed meals on wheels & better choice for diabetes   Very  interested in diabetic diet,  selecting better food choices    Financial concerns Joseph [name] reports financial concerns related to food, home improvements, prosthetic, paying off last hospital bills on a fixed retired supplemental security income ([place]) has used social services once in the winter for electricity charges after got out of hospital in last 12 months goes to a Building surveyor Had a contractor visit to provide a home improve estimate for making his bathroom handicap accessible but unable to afford $10, 000 He owns his home (does not rent) still paying on the mortgage  [facility] care guide with community resources discussed  He agrees to a [facility] care guide referral   Outreach to [employer] ([place] and wellness center) Spoke with Joseph [name] who states Joseph [name] has not been re assigned yet to a new [place] pcp and he would have to call to schedule a new patient appointment in order to be seen Will update patient    Social: Single, live alone Retired at age 30, on a fixed income SSI Very determined person does not like to depend on people Believes in [place], feels blessed, goes to church He has a daughter but reports he does not speak with her daily  Family assisted with widening home doors and building a ramp  Neighbors assisted with yard work $75 Previous applied for [employer] but not eligible    Past Medical History:  Diagnosis Date  . Cellulitis of right lower extremity   . Diabetes mellitus type 2 in nonobese (HCC)   . Gangrene of right foot ([place])   .  Hypertension   . Open wound of right foot    Patient Active Problem List   Diagnosis Date Noted  . Postoperative pain   . AKI (acute kidney injury) ([place])   . Acute blood loss anemia   . Chronic diastolic congestive heart failure ([place])   . Diabetic peripheral neuropathy ([place])   . CKD (chronic kidney disease), stage II   . Hypoalbuminemia due to protein-calorie malnutrition ([place])   . Leukocytosis   . 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[place]) 11/20/2018   . Atrial fibrillation ([place])   . Diabetic polyneuropathy associated with type 2 diabetes mellitus ([place])   . PVD (peripheral vascular disease) ([place])   . Critical lower limb ischemia ([place])   . Sepsis with acute renal failure ([place]) 11/13/2018  . Acute renal failure superimposed on stage 2 chronic kidney disease ([place])   . 'light-for-dates' infant with signs of fetal malnutrition 10/17/2018  . DM (diabetes mellitus) ([place]) 07/02/2012  . Hypertension associated with diabetes ([place]) 07/02/2012     DME: w/c, prosthetic accu chek guide, BP cuff (scales  Current Outpatient Medications on File Prior to Visit  Medication Sig Dispense Refill  . Accu-Chek FastClix Lancets MISC Use to check blood sugars three times per day 100 each 11  . amLODipine (NORVASC) 10 MG tablet Take 1 tablet (10 mg total) by mouth daily. 90 tablet 3  . atorvastatin (LIPITOR) 10 MG tablet Take 1 tablet (10 mg total) by mouth daily. In the evening 90 tablet 3  . Blood Glucose Monitoring Suppl (ACCU-CHEK GUIDE ME) w/Device KIT 1 kit by Does not apply route 3 (three) times daily. To check blood sugars 1 kit 0  . diclofenac sodium (VOLTAREN) 1 % GEL Apply 2 g topically 3 (three) times daily. 2 g 1  . furosemide (LASIX) 40 MG tablet Take 1 tablet (40 mg total) by mouth daily. 30 tablet 1  . gabapentin (NEURONTIN) 300 MG capsule Take 1 capsule (300 mg total) by mouth 3 (three) times daily. 90 capsule 1  . glipiZIDE (GLUCOTROL) 10 MG tablet TAKE 1 TABLET (10 MG TOTAL) BY MOUTH 2 (TWO) TIMES DAILY BEFORE A MEAL. 60 tablet 2  . glucose blood (ACCU-CHEK GUIDE) test strip Use as instructed 100 each 12  . hydrochlorothiazide (MICROZIDE) 12.5 MG capsule Take 1 capsule (12.5 mg total) by mouth daily. 90 capsule 1  . Lancets Misc. (ACCU-CHEK FASTCLIX LANCET) KIT Use when checking blood sugars three times daily 1 kit 11  . Multiple Vitamins-Minerals (ONE-A-DAY MENS 50+ ADVANTAGE) TABS Take 1 tablet by mouth daily with breakfast.    . oxyCODONE  (OXY IR/ROXICODONE) 5 MG immediate release tablet Take 1 tablet (5 mg total) by mouth 2 (two) times daily as needed for severe pain (pain score 7-10). 14 tablet 0  . polyethylene glycol (MIRALAX / GLYCOLAX) 17 g packet Take 17 g by mouth daily as needed for mild constipation. 14 each 0   No current facility-administered medications on file prior to visit.       Plan: Patient agrees to the care plan and follow up within the next 14-21 business days Referral to [facility] care guide for community, financial and housing resources To request [facility] CMA assist with sending BP cuff to home  Pt encouraged to return a call to [place] CM prn Goals Addressed              This Visit's Progress     Patient Stated   .  [facility]) Find Help in My Community (pt-stated)  Not on track     Timeframe:  Long-Range Goal Priority:  Medium Start Date:           10/20/20                  Expected End Date:          12/25/20             Follow Up Date 11/03/20   - begin a notebook of services in my neighborhood or community - call 211 when I need some help - follow-up on any referrals for help I am given - make a list of family or friends that I can call     Notes:  10/20/20 voiced interest in financial resources, meals on wheels/food resources. Community resources, home improvements in bathroom    .  [facility]) Make and Keep All Appointments (pt-stated)   Not on track     Timeframe:  Short-Term Goal Priority:  High Start Date:                   10/20/20          Expected End Date:     11/26/20                  Follow Up Date 11/03/20   - call to cancel if needed - keep a calendar with appointment dates    Notes:  10/20/20 Has not been seen by pcp nor DME provider since 2020. He reports he will make appointments as his finances will allow     .  [facility]) Monitor and Manage My Blood Sugar-Diabetes Type 2 (pt-stated)   On track     Timeframe:  Long-Range Goal Priority:  High Start Date:              10/20/20                Expected End Date:       12/25/20                Follow Up Date 11/03/20    - check blood sugar at prescribed times - check blood sugar if I feel it is too high or too low     Notes:  10/20/20 Denies issues with cbg but diet management    .  [facility]) Track and Manage My Blood Pressure-Hypertension (pt-stated)   On track     Timeframe:  Short-Term Goal Priority:  Medium Start Date:                        10/20/20     Expected End Date:              11/25/20         Follow Up Date 11/03/20   - check blood pressure weekly - choose a place to take my blood pressure (home, clinic or office, retail store)     Notes:  10/20/20 reports poor working BP cuff in home Agrees to BP cuff assistance       Emilo Gras L. Lavina Hamman, RN, BSN, [place] Coordinator Office number [phone] Mobile number [phone]  Main [facility] number [phone] Fax number [phone]

## 2020-10-22 ENCOUNTER — Telehealth: Payer: Self-pay

## 2020-10-22 ENCOUNTER — Other Ambulatory Visit: Payer: Self-pay | Admitting: *Deleted

## 2020-10-22 NOTE — Patient Outreach (Signed)
[facility]) Care Management  10/22/2020  Joseph [name] Nov 13, 1945 [id number]   [facility] Care coordination-collaboration with [facility] care guide  [facility] RN CM received [facility] care guide on referral to meals on wheels via e-mail and resource number left for patient for home improvement services Pending further care guide follow up call   Plan follow up within the next 14-21 business days   Richa Shor L. [name], RN, BSN, CCM [facility] Telephonic Care Management Care Coordinator Office number [phone] Mobile number [phone]  Main [facility] number [phone] Fax number [phone]

## 2020-10-22 NOTE — Telephone Encounter (Addendum)
   Telephone encounter was:  Successful.  10/22/2020 Name: Montenegro Huwe MRN: [id number] DOB: 10-08-1945  Montenegro Uballe is a 75 y.o. year old male who is a primary care patient of No primary care provider on file. . The community resource team was consulted for assistance with Food Insecurity and Home Modifications  Care guide performed the following interventions: Patient provided with information about care guide support team and interviewed to confirm resource needs Placed referral to [facility] via email Spoke with patient about referral to [facility] on Wheels. Referral was emailed to [employer] [phone] ext [phone] at MOW.  Patient was shopping and unable to write down information for [facility] for assistance with bathroom modifications. Left message on patient's  voicemail  with the number to call per patient request .  .  Follow Up Plan:  Care guide will follow up with patient by phone over the next day  Kennis Wissmann, AAS Paralegal, [facility] Care Guide . Embedded Care Coordination [facility] Health  Care Management  [address] [place], [place] [address] [email or web address]  ?? [phone]   [email or web address]

## 2020-10-27 ENCOUNTER — Other Ambulatory Visit: Payer: Self-pay | Admitting: *Deleted

## 2020-10-27 NOTE — Patient Outreach (Addendum)
[facility]) Care Management  10/27/2020  [place] Wlodarczyk 1945-08-03 [id number]   [facility] Care coordination- EMMI education  Sent via mail EMMI education on  diabetes exchange diet, getting the care you need and DASH diet as discussed with pt It is noted in EPIC that pt did schedule his new patient appointment with Marzella Schlein, PA at [facility]) for 12/02/20    Plan follow upwithin the next 14-21 business days Goals Addressed              This Visit's Progress     Patient Stated   .  [facility]) Find Help in My Community (pt-stated)        Timeframe:  Long-Range Goal Priority:  Medium Start Date:           10/20/20                  Expected End Date:          12/25/20             Follow Up Date 11/03/20   - begin a notebook of services in my neighborhood or community - call 211 when I need some help - follow-up on any referrals for help I am given - make a list of family or friends that I can call     Notes:  10/27/20 pt has not made new pcp appointment- encouraged to complete this task 10/22/20 had outreach from [facility] care guide for resources for meals -referral to [facility] MOW via e-mail to Liz [employer]iborne at [employer].  Patient was shopping and unable to write down information for Vocational Rehab Services for assistance with bathroom modifications. A message was left on patient's  voicemail  with the number to call per patient request Care guide will follow up with patient by phone over the next day 10/20/20 voiced interest in financial resources, meals on wheels/food resources. Community resources, home improvements in bathroom    .  [facility]) Make and Keep All Appointments (pt-stated)   On track     Timeframe:  Short-Term Goal Priority:  High Start Date:                   10/20/20          Expected End Date:     11/26/20                  Follow Up Date 11/03/20   - call to cancel if needed - keep a calendar with appointment dates    Notes:  10/27/20  pt had not made new pcp appointment- encouraged to complete this task At end of 10/27/20 pt did schedule his new patient appointment with [facility] for 12/02/20 10/20/20 Has not been seen by pcp nor DME provider since 2020. He reports he will make appointments as his finances will allow         Ashla Murph L. [name], RN, BSN, CCM [facility] Telephonic Care Management Care Coordinator Office number [phone] Mobile number [phone]  Main [facility] number [phone] Fax number [phone]

## 2020-10-27 NOTE — Patient Outreach (Addendum)
[facility]) Care Management  10/27/2020  Montenegro Laroche 1945-11-02 [id number]   [facility] Care coordination  Mr drayk humbarger to get [facility] RN CM to update his new number in EPIC demographics [phone] He reports he is doing well and denies any concerns at this time  He was again encouraged to outreach to his pcp office to scheduled a new patient appointment with a new pcp He voiced understanding   [facility] RN CM sent a note also to Guy Sandifer and updated the vocational rehab services [phone] per patient request [facility] RN CM left a voice message at [phone] Vocation rehab to update the patient outreach number Also left [facility] RN CM office number for any further questions   Plan Patient agrees to care plan and follow upwithin the next 14-21 business days   Kamori Barbier L. [name], RN, BSN, CCM [facility] Telephonic Care Management Care Coordinator Office number [phone] Mobile number [phone]  Main [facility] number [phone] Fax number [phone] '

## 2020-11-03 ENCOUNTER — Telehealth: Payer: Self-pay

## 2020-11-03 ENCOUNTER — Other Ambulatory Visit: Payer: Self-pay

## 2020-11-03 ENCOUNTER — Encounter: Payer: Self-pay | Admitting: *Deleted

## 2020-11-03 ENCOUNTER — Other Ambulatory Visit: Payer: Self-pay | Admitting: *Deleted

## 2020-11-03 NOTE — Patient Outreach (Signed)
[facility]) Care Management  11/03/2020  Montenegro Gulbranson [birth year] [id number]   [facility] Care coordination- Vocational rehab services   Received a return call from [place] from the local Vocational rehab services Provided referral information to [place] s she reports she was not able to find a referral for him in their system to include pt and [facility] RN CM contact information  She will complete a follow up call to Mr [place]    Plan follow upwithin the next 14-21 business days   Shaya Altamura L. [name], RN, BSN, CCM [facility] Telephonic Care Management Care Coordinator Office number [phone] Mobile number [phone]  Main [facility] number [phone] Fax number [phone]

## 2020-11-03 NOTE — Patient Outreach (Signed)
[name]) Care Management  11/03/2020  Joseph [name] Joseph [name] 20-Feb-1946 [id number]   [facility] [name] follow up Telephone Assessment/Screen for complex care referral  [name] [name] was referred to Joseph [name] on 10/08/20 Referral Reason: to be placed in complex, chronic or disease mgmt. Insurance: medicare  Last [facility] admissions   11/20/18 to 12/04/18 In patient rehab right below knee amputee, anemia, CHF, CKD  11/13/18 to 11/20/18 sepsis with acute renal failure, diabetes, HTN, acute renal failure superimposed on stage 2 chronic kidney disease   Outreach attempt successful to [phone] Patient is able to verify HIPAA, DOB and address Reviewed and addressed referral to Joseph [name] patient Consent: [name] RN [name] reviewed Joseph [name] services with patient. Patient gave verbal consent for services Joseph [name] telephonic RN [name].   Follow up assessment Meals on wheels started delivering already. had meal delivered during this outreach He reports daily delivers generally from 02-1129 am  He called vocational rehab to get safety bars in bathroom and left Joseph message is pending Joseph response. He gives permission for [name] RN [name] to follow up for him   He schedule New primary care provider (PCP) appointment for 12/02/20 Joseph [name]      Hypertension (HTN)/congestive Heart Failure (CHF)/Atrial fibrillation He reports his HTN home care is manageable received BP cuff sent by Joseph [name] in mail on 10/31/20  SBP elevated 150  Stopped taking Norvasc  Joseph [name] reviewed Norvasc and Lipitor 02/06/20 orders  Pt will call Joseph [name] (Joseph [name]) to see if have available refills and restart   He reports generally exercising with yard work at least 4 hours during the day He drives 1-2 times per week to run minimal errands  He walks 95% with his prosthetic daily   Diabetes Reports he has home diabetes managed, denies elevations with cbg values  HgA1c 9.8 02/06/20 Got right prosthetic- wears it 95% time except when resting  healed right BKA stump     Patient goal interventions Pt to further Outreach to 211, Department of Social Services (DSS) for [employer] application, follow up on resources provided Review materials/resources being sent to him via mail  To call [name] about refills on Norvasc and Atorvastatin   Joseph [name] care coordination Updated Joseph [name] care guide for Joseph [name] for home back door ramp, inquired about vocational rehab and Joseph [name] with Joseph [name] who also had outreached to patient on today  Shared 211 as Joseph resource for Joseph [name] resources  Outreached to [phone] vocational rehab, spoke with [place], transferred to the independent living department staff, Joseph [name] and left Joseph voice message to include pt new number, Joseph [name] number and request from pt for update on status for assistance Outreach to community housing solutions [phone] extension Joseph [name] was left Joseph message to include referral information, pt new number, Joseph [name] number and request from pt referral for assistance Joseph [name] staff absence) Mailed advance directive package, basic medicaid eligibility sheet and medicare fact sheet  Gave [place] DSS # 336) [phone] [address], [place], [place] [address] Encouraged to outreach to  number on bill for assistance  answered questions about advance directives  Joseph [name] disease management/education  differences in [place] and medicare services related to services, Different medicare parts, 24 hour nurse line service, advance directives, 69, DSS Has advance directive but does not have on file encourage to give to pcp request new to update  Social: Single, live alone Retired at age 12, on Joseph fixed income SSI-  $1600 in retirement Very determined person does not like to depend on people Believes in [place], feels blessed, goes to church Divorced but remains friends with his ex wife, Joseph [name] He has Joseph daughter and son but reports he does not  speak with them daily  Family assisted with widening home doors and building Joseph ramp  Most reliable family members reported to live in [place] assisted with yard work $75 Previous applied for [employer] but not eligible  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 Postoperative pain   . AKI (acute kidney injury) ([place])   . Acute blood loss anemia   . Chronic diastolic congestive heart failure ([place])   . Diabetic peripheral neuropathy ([place])   . CKD (chronic kidney disease), stage II   . 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 Leukocytosis   . 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[place]) 11/20/2018  . Atrial fibrillation ([place])   . Diabetic polyneuropathy associated with type 2 diabetes mellitus ([place])   . PVD (peripheral vascular disease) ([place])   . Critical lower limb ischemia ([place])   . Sepsis with acute renal failure ([place]) 11/13/2018  . Acute renal failure superimposed on stage 2 chronic kidney disease ([place])   . 'light-for-dates' infant with signs of fetal malnutrition 10/17/2018  . DM (diabetes mellitus) ([place]) 07/02/2012  . Hypertension associated with diabetes ([place]) 07/02/2012   Current Outpatient Medications on File Prior to Visit  Medication Sig Dispense Refill  . Accu-Chek FastClix Lancets MISC Use to check blood sugars three times per day 100 each 11  . aspirin EC 81 MG tablet Take 81 mg by mouth daily. Swallow whole.    . Blood Glucose Monitoring Suppl (ACCU-CHEK GUIDE ME) w/Device KIT 1 kit by Does not apply route 3 (three) times daily. To check blood sugars 1 kit 0  . glipiZIDE (GLUCOTROL) 10 MG tablet TAKE 1 TABLET (10 MG TOTAL) BY MOUTH 2 (TWO) TIMES DAILY BEFORE Joseph MEAL. 60 tablet 2  . glucose blood (ACCU-CHEK GUIDE) test strip Use as instructed 100 each 12  . Lancets Misc. (ACCU-CHEK FASTCLIX LANCET) KIT Use when checking  blood sugars three times daily 1 kit 11  . Multiple Vitamins-Minerals (ONE-Joseph-DAY MENS 50+ ADVANTAGE) TABS Take 1 tablet by mouth daily with breakfast.    . amLODipine (NORVASC) 10 MG tablet Take 1 tablet (10 mg total) by mouth daily. (Patient not taking: Reported on 11/03/2020) 90 tablet 3  . atorvastatin (LIPITOR) 10 MG tablet Take 1 tablet (10 mg total) by mouth daily. In the evening (Patient not taking: Reported on 11/03/2020) 90 tablet 3  . diclofenac sodium (VOLTAREN) 1 % GEL Apply 2 g topically 3 (three) times daily. 2 g 1  . furosemide (LASIX) 40 MG tablet Take 1 tablet (40 mg total) by mouth daily. 30 tablet 1  . gabapentin (NEURONTIN) 300 MG capsule Take 1 capsule (300 mg total) by mouth 3 (three) times daily. 90 capsule 1  . hydrochlorothiazide (MICROZIDE) 12.5 MG capsule Take 1 capsule (12.5 mg total) by mouth daily. 90 capsule 1  . oxyCODONE (OXY IR/ROXICODONE) 5 MG immediate release tablet Take 1 tablet (5 mg total) by mouth 2 (two) times daily as needed for severe pain (pain score 7-10). 14 tablet 0  .  polyethylene glycol (MIRALAX / GLYCOLAX) 17 g packet Take 17 g by mouth daily as needed for mild constipation. 14 each 0   No current facility-administered medications on file prior to visit.    Plans Patient agrees to the care plan and follow up within the next 14-21 business days Referral to [place] gracefully/[place] [name] community solutions program Pt encouraged to return Joseph call to [place] [name] prn Goals Addressed              This Visit's Progress     Patient Stated   .  [facility]) Find Help in My Community (pt-stated)   On track     Timeframe:  Long-Range Goal Priority:  Medium Start Date:           10/20/20                  Expected End Date:          12/25/20             Follow Up Date 11/18/20 Barriers: Knowledge   - begin Joseph notebook of services in my neighborhood or community - call 211 when I need some help - follow-up on any referrals for help I am given - make  Joseph list of family or friends that I can call     Notes:  11/03/20 received BP cuff from [name], monitoring, made new pcp appointment with Joseph [name] for 12/02/20 Pt to further Outreach to 211, Department of Social Services (DSS) for [employer] application, follow up on resources provided Review materials/resources being sent to him via mail  10/27/20 pt has not made new pcp appointment- encouraged to complete this task 10/22/20 had outreach from [facility] care guide for resources for meals -referral to [place] via e-mail to [employer] at [employer].  Patient was shopping and unable to write down information for Vocational Rehab Services for assistance with bathroom modifications. Joseph message was left on patient's  voicemail  with the number to call per patient request Care guide will follow up with patient by phone over the next day 10/20/20 voiced interest in financial resources, meals on wheels/food resources. Community resources, home improvements in bathroom    .  [name]) Make and Keep All Appointments (pt-stated)   On track     Timeframe:  Short-Term Goal Priority:  High Start Date:                   10/20/20          Expected End Date:     11/26/20                  Follow Up Date 11/18/20 Barriers: Knowledge   - call to cancel if needed - keep Joseph calendar with appointment dates     Notes:  11/03/20 made new pcp appointment with Joseph [name] for 12/02/20 Pt to further  To call [place] about refills on Norvasc and Atorvastatin 10/27/20 pt had not made new pcp appointment- encouraged to complete this task At end of 10/27/20 pt did schedule his new patient appointment with [name] for 12/02/20 10/20/20 Has not been seen by pcp nor DME provider since 2020. He reports he will make appointments as his finances will allow     .  Joseph [name]) Monitor and Manage My Blood Sugar-Diabetes Type 2 (pt-stated)   On track     Timeframe:  Long-Range Goal Priority:  High Start Date:  10/20/20               Expected End  Date:       12/25/20                Follow Up Date 11/18/20 Barriers: Knowledge   - check blood sugar at prescribed times - check blood sugar if I feel it is too high or too low      Notes:  11/03/20 monitoring home cbg Reports he believes he has DM under control Denies cbg elevations or lows made aware of last HgA1c of 9.8 Now with confirmed meals on wheels daily delivers from 02-1129 am 10/20/20 Denies issues with cbg but diet management    .  [facility]) Track and Manage My Blood Pressure-Hypertension (pt-stated)   On track     Timeframe:  Short-Term Goal Priority:  Medium Start Date:                        10/20/20     Expected End Date:              11/25/20         Follow Up Date 11/18/20 Barriers: Knowledge    - check blood pressure weekly - choose Joseph place to take my blood pressure (home, clinic or office, retail store)     Notes:  11/03/20 confirms he received Joseph BP cuff mailed is monitoring at home weekly Noted SBP elevation in 150s. To call [place] about refills on Norvasc and Atorvastatin. Given 24 hr nurse number 10/20/20 reports poor working BP cuff in home Agrees to BP cuff assistance       [place] Vayda L. Lavina Hamman, RN, BSN, [place] Coordinator Office number [phone] Main [name] number [phone] Fax number [phone]

## 2020-11-03 NOTE — Telephone Encounter (Signed)
   Telephone encounter was:  Successful.  11/03/2020 Name: Joseph [name] MRN: [id number] DOB: [birth year]  Joseph [name] is a 75 y.o. year old male who is a primary care patient of No primary care provider on file. . The community resource team was consulted for assistance with meals on wheels and home safety modifications  Care guide performed the following interventions: Follow up call placed to the patient to discuss status of referral Spoke with Edd Arbour, RN regarding referral to Aging Gracefully program for home safety modifications and she will follow-up with Vocational Rehab/[place].  Spoke with patient he is receiving Meals on Wheels updated patient about Aging Cytogeneticist.  No further assistance required at this time per patient and Edd Arbour, RN.  .  Follow Up Plan:  No further follow up planned at this time. The patient has been provided with needed resources.  Jenin Birdsall, AAS Paralegal, [facility] Care Guide . Embedded Care Coordination [facility] Health  Care Management  [address] [place], [place] [address] ??[email or web address]  ?? [phone]   [email or web address]

## 2020-11-03 NOTE — Telephone Encounter (Signed)
   Telephone encounter was:  Unsuccessful.  11/03/2020 Name: Joseph [name] MRN: [id number] DOB: 10/08/1945  Unsuccessful outbound call made today to assist with:  Unable to leave message voicemail not set-up calling regarding Meals on Wheels and Vocational Rehab  Outreach Attempt:  2nd Attempt  [name], AAS Paralegal, [facility] Care Guide . Embedded Care Coordination [facility] Health  Care Management  [address] [place], [place] [address] ??[email or web address]  ?? [phone]   [email or web address]

## 2020-11-03 NOTE — Patient Outreach (Addendum)
[facility]) Care Management  11/03/2020  [place] Yoo Aug 29, 1945 [id number]   [facility] Care coordination-collaboration with vocational rehab staff  [facility] RN CM received a call from [facility] [employer] of vocational rehab [phone] (main #) /0526 Alcario Drought Ladona Ridgel) After difficulty with outreach to pt and inability to leave a voice message as his new phone does not have a voice mail box set up   [facility] RN CM attempted to assist by completing a conference call with her to pt but no answer and no mail box Encouraged outreach between 10-11 am daily and informed her if pt outreach to [facility] RN CM she would provided him the vocational rehab outreach contact numbers    Plan  Patient agrees to care plan and follow up within 14-21 business days   [place] L. [name], RN, BSN, CCM [facility] Telephonic Care Management Care Coordinator Office number [phone] Mobile number [phone]  Main [facility] number [phone] Fax number [phone]

## 2020-11-18 ENCOUNTER — Other Ambulatory Visit: Payer: Self-pay | Admitting: *Deleted

## 2020-11-18 ENCOUNTER — Other Ambulatory Visit: Payer: Self-pay

## 2020-11-18 NOTE — Patient Outreach (Signed)
[facility]) Care Management  11/18/2020  Joseph [name] 1945-06-16 [id number]  [facility] follow up Telephone Assessment/Screen for complex care referral   Joseph Joseph [name] was referred to [facility] on 10/08/20 Referral Reason: to be placed in complex, chronic or disease mgmt. Insurance: medicare   Last [facility] admissions   11/20/18 to 12/04/18 In patient rehab right below knee amputee, anemia, CHF, CKD 11/13/18 to 11/20/18 sepsis with acute renal failure, diabetes, HTN, acute renal failure superimposed on stage 2 chronic kidney disease     Outreach attempt successful to [phone] Patient is able to verify HIPAA, DOB and address Reviewed and addressed referral to [facility] with patient Consent: [facility] RN CM reviewed [facility] services with patient. Patient gave verbal consent for services [facility] telephonic RN CM.     Follow up assessment Joseph [name] discusses he is not at home for this outreach today, completing errands This outreach was brief Vocational rehab staff were able to outreach to patient successfully to begin assessing and providing information for home improvements in the last 1-2 weeks  DME reading glasses   Encouraged him to set up a voice message on his phone soon and sent him the mychart   Medicaid did receive medicaid  Plans Patient agrees to the care plan and follow up within the next 14-21 business days Referral to [place] Aging gracefully[place] community solutions program Pt encouraged to return a call to [facility] RN CM prn  Goals Addressed               This Visit's Progress     Patient Stated     [facility]) Find Help in My Community (pt-stated)   On track     Timeframe:  Long-Range Goal Priority:  Medium Start Date:           10/20/20                  Expected End Date:          12/25/20             Follow Up Date 12/03/20 Barriers: Knowledge   - begin a notebook of services in my neighborhood or community - call 211 when I need some help - follow-up on any referrals  for help I am given - make a list of family or friends that I can call     Notes:  11/18/20 able to speak briefly, confirms had outreach from vocational rehab staff about home improvements, has received medicaid application 11/03/20 received BP cuff from [facility], monitoring, made new pcp appointment with A McClung PAC for 12/02/20 Pt to further Outreach to 211, Department of Social Services (DSS) for [employer] application, follow up on resources provided Review materials/resources being sent to him via mail  10/27/20 pt has not made new pcp appointment- encouraged to complete this task 10/22/20 had outreach from [facility] care guide for resources for meals -referral to [facility] MOW via e-mail to [employer] at [employer].  Patient was shopping and unable to write down information for [facility] Services for assistance with bathroom modifications. A message was left on patient's  voicemail  with the number to call per patient request Care guide will follow up with patient by phone over the next day 10/20/20 voiced interest in financial resources, meals on wheels/food resources. Community resources, home improvements in bathroom       [facility]) Make and Keep All Appointments (pt-stated)   On track     Timeframe:  Short-Term Goal Priority:  High Start Date:                   10/20/20          Expected End Date:     12/11/20                  Follow Up Date 12/03/20 Barriers: Knowledge   - call to cancel if needed - keep a calendar with appointment dates     Notes:  11/18/20 able to speak briefly, pending new PCP appointment on 12/02/20, has not cancelled 11/03/20 made new pcp appointment with A McClung PAC for 12/02/20 Pt to further  To call [facility] about refills on Norvasc and Atorvastatin 10/27/20 pt had not made new pcp appointment- encouraged to complete this task At end of 10/27/20 pt did schedule his new patient appointment with [facility] for 12/02/20 10/20/20 Has not been seen by pcp nor DME provider since 2020. He  reports he will make appointments as his finances will allow           Joseph Floren L. [name], RN, BSN, CCM [facility] Telephonic Care Management Care Coordinator Office number [phone] [address] [facility] number [phone] Fax number [phone]

## 2020-11-26 ENCOUNTER — Other Ambulatory Visit: Payer: Self-pay

## 2020-11-26 ENCOUNTER — Telehealth: Payer: Self-pay | Admitting: Physician Assistant

## 2020-11-26 ENCOUNTER — Other Ambulatory Visit: Payer: Self-pay | Admitting: Physician Assistant

## 2020-11-26 DIAGNOSIS — E1165 Type 2 diabetes mellitus with hyperglycemia: Secondary | ICD-10-CM

## 2020-11-26 NOTE — Telephone Encounter (Signed)
Copied from CRM [id number]. Topic: Quick Communication - Rx Refill/Question >> Nov 26, 2020 10:12 AM Jaquita Rector A wrote: Medication: glipiZIDE (GLUCOTROL) 10 MG tablet, hydrochlorothiazide (MICROZIDE) 12.5 MG capsule, hydrochlorothiazide (MICROZIDE) 12.5 MG capsule  Need refill till upcoming appointment  Has the patient contacted their pharmacy? Yes.   (Agent: If no, request that the patient contact the pharmacy for the refill.) (Agent: If yes, when and what did the pharmacy advise?)  Preferred Pharmacy (with phone number or street name): [facility]  Phone:  [phone] Fax:  [phone]     Agent: Please be advised that RX refills may take up to 3 business days. We ask that you follow-up with your pharmacy.

## 2020-11-26 NOTE — Telephone Encounter (Signed)
   Notes to clinic:  Patient scheduled for appt on 12/02/2020 Review for refill until that time    Requested Prescriptions  Pending Prescriptions Disp Refills   glipiZIDE (GLUCOTROL) 10 MG tablet 60 tablet 2    Sig: TAKE 1 TABLET (10 MG TOTAL) BY MOUTH 2 (TWO) TIMES DAILY BEFORE A MEAL.      Endocrinology:  Diabetes - Sulfonylureas Failed - 11/26/2020 10:10 AM      Failed - HBA1C is between 0 and 7.9 and within 180 days    Hemoglobin A1C  Date Value Ref Range Status  02/06/2020 9.8 (A) 4.0 - 5.6 % Final   HbA1c, POC (prediabetic range)  Date Value Ref Range Status  10/17/2018 5.7 5.7 - 6.4 % Final   Hgb A1c MFr Bld  Date Value Ref Range Status  05/06/2019 11.3 (H) 4.8 - 5.6 % Final    Comment:             Prediabetes: 5.7 - 6.4          Diabetes: >6.4          Glycemic control for adults with diabetes: <7.0           Failed - Valid encounter within last 6 months    Recent Outpatient Visits           9 months ago Type 2 diabetes mellitus with hyperglycemia, without long-term current use of insulin [facility])   [facility] [place], [place], [place]   1 year ago Stage 3b chronic kidney disease   [facility] [place], [place], [place]   1 year ago Diabetic polyneuropathy associated with type 2 diabetes mellitus (HCC)   [place] [facility] [facility] [place], [place], [place]   2 years ago Type 2 diabetes mellitus with hyperglycemia, unspecified whether long term insulin use [facility])   [place] [facility] [place], [place], [place]   7 years ago DM (diabetes mellitus)   [facility], Tessa Lerner, [place]       Future Appointments             In 6 days Sharon Seller [facility]

## 2020-11-26 NOTE — Telephone Encounter (Signed)
Duplicate request already sent to office for approval

## 2020-11-29 DIAGNOSIS — Z20822 Contact with and (suspected) exposure to covid-19: Secondary | ICD-10-CM | POA: Diagnosis not present

## 2020-12-02 ENCOUNTER — Ambulatory Visit: Payer: Medicare Other | Attending: Physician Assistant | Admitting: Physician Assistant

## 2020-12-02 ENCOUNTER — Other Ambulatory Visit: Payer: Self-pay

## 2020-12-02 DIAGNOSIS — E1165 Type 2 diabetes mellitus with hyperglycemia: Secondary | ICD-10-CM | POA: Diagnosis not present

## 2020-12-02 DIAGNOSIS — I5032 Chronic diastolic (congestive) heart failure: Secondary | ICD-10-CM

## 2020-12-02 DIAGNOSIS — S90829A Blister (nonthermal), unspecified foot, initial encounter: Secondary | ICD-10-CM | POA: Diagnosis not present

## 2020-12-02 DIAGNOSIS — I10 Essential (primary) hypertension: Secondary | ICD-10-CM

## 2020-12-02 DIAGNOSIS — E785 Hyperlipidemia, unspecified: Secondary | ICD-10-CM | POA: Diagnosis not present

## 2020-12-02 MED ORDER — ATORVASTATIN CALCIUM 10 MG PO TABS
10.0000 mg | ORAL_TABLET | Freq: Every day | ORAL | 3 refills | Status: DC
  Filled 2020-12-02: qty 30, 30d supply, fill #0

## 2020-12-02 MED ORDER — FUROSEMIDE 40 MG PO TABS
40.0000 mg | ORAL_TABLET | Freq: Every day | ORAL | 1 refills | Status: DC
  Filled 2020-12-02: qty 30, 30d supply, fill #0

## 2020-12-02 MED ORDER — GLIPIZIDE 10 MG PO TABS
ORAL_TABLET | Freq: Two times a day (BID) | ORAL | 2 refills | Status: DC
Start: 2020-12-02 — End: 2021-02-04
  Filled 2020-12-02: qty 60, 30d supply, fill #0
  Filled 2020-12-29: qty 60, 30d supply, fill #1

## 2020-12-02 MED ORDER — HYDROCHLOROTHIAZIDE 12.5 MG PO CAPS: 12.5000 mg | ORAL_CAPSULE | Freq: Every day | ORAL | 1 refills | Status: DC

## 2020-12-02 MED ORDER — AMLODIPINE BESYLATE 10 MG PO TABS
10.0000 mg | ORAL_TABLET | Freq: Every day | ORAL | 3 refills | Status: DC
Start: 2020-12-02 — End: 2021-02-15
  Filled 2020-12-02: qty 30, 30d supply, fill #0
  Filled 2020-12-29: qty 30, 30d supply, fill #1

## 2020-12-02 MED ORDER — GABAPENTIN 300 MG PO CAPS
300.0000 mg | ORAL_CAPSULE | Freq: Three times a day (TID) | ORAL | 2 refills | Status: DC
  Filled 2020-12-02: qty 90, 30d supply, fill #0

## 2020-12-02 MED ORDER — MUPIROCIN 2 % EX OINT
1.0000 | TOPICAL_OINTMENT | Freq: Two times a day (BID) | CUTANEOUS | 0 refills | Status: DC
Start: 2020-12-02 — End: 2021-02-15
  Filled 2020-12-02: qty 22, 11d supply, fill #0

## 2020-12-02 NOTE — Progress Notes (Signed)
Virtual Visit via Telephone Note  I connected with Joseph [name] on 12/02/20 at 10:50 AM EDT by telephone and verified that I am speaking with the correct person using two identifiers.  Location: Patient: home Provider: [facility] office   I discussed the limitations, risks, security and privacy concerns of performing an evaluation and management service by telephone and the availability of in person appointments. I also discussed with the patient that there may be a patient responsible charge related to this service. The patient expressed understanding and agreed to proceed.   History of Present Illness:  [name] needs med RF.  He has been out of his meds for about 1 week.  Blood sugars running about 200 since he is out.  Before he ran out, they were running <150.  He has a small blister on one of his toes.  He does not remember bumping the area.  It has actually improved over the last week and seems to be healing.      Observations/Objective:  NAD.  A&Ox3   Assessment and Plan: 1. Type 2 diabetes mellitus with hyperglycemia, without long-term current use of insulin (HCC) - Hemoglobin A1c; Future - Comprehensive metabolic panel; Future - glipiZIDE (GLUCOTROL) 10 MG tablet; TAKE 1 TABLET (10 MG TOTAL) BY MOUTH 2 (TWO) TIMES DAILY BEFORE A MEAL.  Dispense: 60 tablet; Refill: 2 - gabapentin (NEURONTIN) 300 MG capsule; Take 1 capsule (300 mg total) by mouth 3 (three) times daily.  Dispense: 90 capsule; Refill: 2 - atorvastatin (LIPITOR) 10 MG tablet; Take 1 tablet (10 mg total) by mouth daily. In the evening  Dispense: 90 tablet; Refill: 3  2. Hypertension, unspecified type - hydrochlorothiazide (MICROZIDE) 12.5 MG capsule; Take 1 capsule (12.5 mg total) by mouth daily.  Dispense: 90 capsule; Refill: 1 - amLODipine (NORVASC) 10 MG tablet; Take 1 tablet (10 mg total) by mouth daily.  Dispense: 90 tablet; Refill: 3  3. Blister of foot, unspecified laterality, initial encounter Will be on the cautious  side and refer-go to UC if gets bigger - mupirocin ointment (BACTROBAN) 2 %; Apply 1 application topically 2 (two) times daily.  Dispense: 22 g; Refill: 0 - Ambulatory referral to Podiatry  4. Chronic diastolic congestive heart failure (HCC) - furosemide (LASIX) 40 MG tablet; Take 1 tablet (40 mg total) by mouth daily.  Dispense: 30 tablet; Refill: 1  5. Hyperlipidemia, unspecified hyperlipidemia type - Comprehensive metabolic panel; Future - Lipid panel   Follow Up Instructions: See PCP in 2-3 months   I discussed the assessment and treatment plan with the patient. The patient was provided an opportunity to ask questions and all were answered. The patient agreed with the plan and demonstrated an understanding of the instructions.   The patient was advised to call back or seek an in-person evaluation if the symptoms worsen or if the condition fails to improve as anticipated.  I provided 14 minutes of non-face-to-face time during this encounter.   [place], PA-C  Patient ID: Joseph [name], male   DOB: 12-16-1945, 75 y.o.   MRN: [id number]

## 2020-12-03 ENCOUNTER — Other Ambulatory Visit: Payer: Self-pay

## 2020-12-03 ENCOUNTER — Other Ambulatory Visit: Payer: Self-pay | Admitting: *Deleted

## 2020-12-03 NOTE — Patient Outreach (Signed)
[facility]) Care Management  12/03/2020  Joseph [name] 1946-05-20 [id number]    [facility] follow up for complex care referral   Referral Date: 10/08/20 Referral Reason: to be placed in complex, chronic or disease mgmt. Insurance: medicare   [facility]   11/20/18 to 12/04/18 In patient rehab right below knee amputee, anemia, CHF, CKD 11/13/18 to 11/20/18 sepsis with acute renal failure, diabetes, HTN, acute renal failure superimposed on stage 2 chronic kidney disease  Patient is able to verify HIPAA [facility] Portability and Accountability Act) identifiers Reviewed and addressed Joseph purpose of Joseph follow up call with Joseph patient  Consent: [facility] ([facility]) RN CM reviewed [facility] services with patient. Patient gave verbal consent for services.    Assessment  Joseph [name] reports overall he is doing well but continues to need some home services  Vocational rehab staff reached him and gathered information, pending return call  Joseph [name] agrees to update [facility] RN CM if he does not receive a return call by 12/11/20  Meals on wheels started 11/03/20 but not seen last in Joseph last week, think Joseph last delivery on 6/29 or 11/26/20 Phone now able to receive voice messages  Aging gracefully  He did received an outreach (ramp, bathroom safety bars) and is pending a return call Joseph [name] agrees to update [facility] RN CM if he does not receive a return call by 12/11/20  primary care provider (PCP) spoke with Dr Sharon Seller on 12/02/20 (telephonic outreach)   [facility] foot bl[facility]    Outreached in a conference call with patient to meals on wheels [phone] Spoke with [place] who will assist with restarting his meal program Outreached with patient to [facility] ([place] [facility]) to assist patient with a follow up appointment with Dr Sharon Seller (face to face per [name]) [name] at [phone] who assisted with  scheduling Joseph [name] an appointment for 12/30/20 Wednesday at 0910 am Reviewed [facility] CM/SW services [name] reports Joseph [name] will also be able to request to see Joseph CM/SW during his 12/30/20 office visit E-mail message to be sent to [facility] CM  Joseph [name] inquired about help utility  bill  outreached to [employer] of Social Services (DSS) [phone] with him (conference call) about his $375 bill  Joseph wait time was extensive and Joseph [name] chose to go visit Joseph local office on 12/04/20 [facility] RN CM encouraged him also to inquire about assistance from local churches He states he will also attempt to do this  Sent a list of all these resource agency numbers to him via mail  Plans  Patient agrees to care plan and follow up within Joseph next 30 business days    [name]. [name], RN, BSN, CCM [facility] Telephonic Care Management Care Coordinator Office number [phone] [facility] number [phone] Fax number [phone]

## 2020-12-04 ENCOUNTER — Other Ambulatory Visit: Payer: Self-pay

## 2020-12-08 ENCOUNTER — Other Ambulatory Visit: Payer: Self-pay

## 2020-12-08 ENCOUNTER — Encounter: Payer: Self-pay | Admitting: Podiatry

## 2020-12-08 ENCOUNTER — Ambulatory Visit (INDEPENDENT_AMBULATORY_CARE_PROVIDER_SITE_OTHER): Payer: Medicare Other

## 2020-12-08 ENCOUNTER — Ambulatory Visit (INDEPENDENT_AMBULATORY_CARE_PROVIDER_SITE_OTHER): Payer: Medicare Other | Admitting: Podiatry

## 2020-12-08 DIAGNOSIS — L97523 Non-pressure chronic ulcer of other part of left foot with necrosis of muscle: Secondary | ICD-10-CM | POA: Diagnosis not present

## 2020-12-08 DIAGNOSIS — E1142 Type 2 diabetes mellitus with diabetic polyneuropathy: Secondary | ICD-10-CM

## 2020-12-08 DIAGNOSIS — I739 Peripheral vascular disease, unspecified: Secondary | ICD-10-CM

## 2020-12-08 DIAGNOSIS — E08621 Diabetes mellitus due to underlying condition with foot ulcer: Secondary | ICD-10-CM | POA: Diagnosis not present

## 2020-12-08 DIAGNOSIS — L97522 Non-pressure chronic ulcer of other part of left foot with fat layer exposed: Secondary | ICD-10-CM

## 2020-12-08 MED ORDER — AMOXICILLIN-POT CLAVULANATE 875-125 MG PO TABS
1.0000 | ORAL_TABLET | Freq: Two times a day (BID) | ORAL | 0 refills | Status: DC
  Filled 2020-12-08: qty 20, 10d supply, fill #0

## 2020-12-08 NOTE — Progress Notes (Signed)
  Subjective:  Patient ID: Joseph [name], male    DOB: Oct 04, 1945,  MRN: [id number]  Chief Complaint  Patient presents with   Diabetic Ulcer    Left great toe, started as a blister a few weeks ago. Lost his right leg a few years ago, BKA    75 y.o. male presents with the above complaint. History confirmed with patient.  His A1c was 9.8 in the fall.  He is due to have a new one checked but has not completed it yet.  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  Objective:  Physical Exam: warm, good capillary refill.  His pulses are nonpalpable.  Hemosiderosis throughout the lower extremities.  Diffuse neuropathy.  He has a large plantar ulceration measuring 3.8 x 3.0 x 0.3 cm with exposed tendon and fascia.    Radiographs: Multiple views x-ray of the left foot: no soft tissue emphysema, no signs of osteomyelitis Assessment:   1. 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 PVD (peripheral vascular disease) (HCC)   3. Diabetic polyneuropathy associated with type 2 diabetes mellitus (HCC)   4. Diabetic ulcer of toe of left foot associated with diabetes mellitus due to underlying condition, with fat layer exposed (HCC)      Plan:  Patient was evaluated and treated and all questions answered.  Ulcer left hallux -He is at high risk of limb loss which I discussed with him.  His multiple risk factors including diabetes uncontrolled and peripheral vascular disease. -ABI and TBI ultrasound ordered -MRI of the left hallux ordered for evaluation of osteomyelitis given depth and severity -Augmentin 10-day prescription sent for him -Debridement as below. -Dressed with Iodosorb, DSD.  He has a sent home and will use this or mupirocin which he also has. -off-loading with surgical shoe.  This was dispensed today with a peg assist device to offload the  wound  Procedure: Excisional Debridement of Wound Rationale: Removal of non-viable soft tissue from the wound to promote healing.  Anesthesia: none Post-Debridement Wound Measurements: 3.8 cm x 3.0 cm x 0.3 cm  Type of Debridement: Sharp Excisional Tissue Removed: Non-viable soft tissue Depth of Debridement: subcutaneous tissue. Technique: Sharp excisional debridement to bleeding, viable wound base.  Dressing: Dry, sterile, compression dressing. Disposition: Patient tolerated procedure well.       Return in about 2 weeks (around 12/22/2020) for wound care, after MRI to review.

## 2020-12-09 ENCOUNTER — Other Ambulatory Visit: Payer: Self-pay

## 2020-12-14 ENCOUNTER — Other Ambulatory Visit: Payer: Medicare Other

## 2020-12-14 ENCOUNTER — Encounter (HOSPITAL_COMMUNITY): Payer: Medicare Other

## 2020-12-14 ENCOUNTER — Other Ambulatory Visit: Payer: Self-pay

## 2020-12-18 ENCOUNTER — Other Ambulatory Visit: Payer: Self-pay | Admitting: *Deleted

## 2020-12-18 NOTE — Patient Outreach (Signed)
[facility]) Care Management  12/18/2020  [place] Paparella 1946/04/30 [id number]  Telephone outreach for care coordination information. Talked with Mr. Ruhe today. He advises he had been receiving meals on wheels. NP advised pt that unfortunately the [facility] Giving Fund is depleted and that [facility] is no longer able to pay for these meals. Advised that the meals will be offered again when the fund is replenished to pt's that are in need when they are discharged from the hospital and for a 2 week period only.  Pt states he understands.  Called Milicent Adames, care guide, to inform her and to ensure that pt's meals were not being paid for by another resource. Left message for a return call.  Zara Council. Burgess Estelle, MSN, [facility] Gerontological Nurse Practitioner [facility] Care Management [phone]

## 2020-12-28 ENCOUNTER — Ambulatory Visit (INDEPENDENT_AMBULATORY_CARE_PROVIDER_SITE_OTHER): Payer: Medicare Other | Admitting: Podiatry

## 2020-12-28 ENCOUNTER — Other Ambulatory Visit: Payer: Self-pay

## 2020-12-28 DIAGNOSIS — E1142 Type 2 diabetes mellitus with diabetic polyneuropathy: Secondary | ICD-10-CM

## 2020-12-28 DIAGNOSIS — L97523 Non-pressure chronic ulcer of other part of left foot with necrosis of muscle: Secondary | ICD-10-CM | POA: Diagnosis not present

## 2020-12-28 DIAGNOSIS — E08621 Diabetes mellitus due to underlying condition with foot ulcer: Secondary | ICD-10-CM

## 2020-12-28 DIAGNOSIS — I739 Peripheral vascular disease, unspecified: Secondary | ICD-10-CM

## 2020-12-28 NOTE — Patient Instructions (Signed)
Call to re-schedule your testing appointments

## 2020-12-28 NOTE — Progress Notes (Signed)
  Subjective:  Patient ID: Joseph [name], male    DOB: 02-Sep-1945,  MRN: [id number]  Chief Complaint  Patient presents with   Diabetic Ulcer     wound care left foot    75 y.o. male presents with the above complaint. History confirmed with patient.  Did not complete the MRI or ABIs yet, he had to go out of town emergently and he lost the phone number for  Objective:  Physical Exam: warm, good capillary refill.  His pulses are nonpalpable.  Hemosiderosis throughout the lower extremities.  Diffuse neuropathy.  He has a large plantar ulceration measuring 4.0 x 3.0 x 0.4 cm with exposed tendon and fascia.  Slightly improved characteristics since last visit     Radiographs: Multiple views x-ray of the left foot: no soft tissue emphysema, no signs of osteomyelitis Assessment:   1. 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 PVD (peripheral vascular disease) (HCC)   3. Diabetic polyneuropathy associated with type 2 diabetes mellitus (HCC)       Plan:  Patient was evaluated and treated and all questions answered.  Ulcer left hallux -He is at high risk of limb loss which I discussed with him.  His multiple risk factors including diabetes uncontrolled and peripheral vascular disease. -I gave him the contact info to reschedule his ABI and TBI with ultrasound and MRI -Debridement as below. -Dressed with Iodosorb, DSD.  He purchased more Iodosorb today -Continue off-loading with surgical shoe.     Procedure: Excisional Debridement of Wound Rationale: Removal of non-viable soft tissue from the wound to promote healing.  Anesthesia: none Post-Debridement Wound Measurements: 4.0 x 3.0 x 0.4 cm Type of Debridement: Sharp Excisional Tissue Removed: Non-viable soft tissue Depth of Debridement: subcutaneous tissue. Technique: Sharp excisional debridement to bleeding, viable wound base.  Dressing: Dry,  sterile, compression dressing. Disposition: Patient tolerated procedure well.       Return in about 2 weeks (around 01/11/2021) for wound care.

## 2020-12-30 ENCOUNTER — Encounter: Payer: Self-pay | Admitting: *Deleted

## 2020-12-30 ENCOUNTER — Encounter: Payer: Self-pay | Admitting: Physician Assistant

## 2020-12-30 ENCOUNTER — Other Ambulatory Visit: Payer: Self-pay

## 2020-12-30 ENCOUNTER — Other Ambulatory Visit: Payer: Self-pay | Admitting: *Deleted

## 2020-12-30 ENCOUNTER — Other Ambulatory Visit: Payer: Self-pay | Admitting: Physician Assistant

## 2020-12-30 ENCOUNTER — Telehealth: Payer: Self-pay

## 2020-12-30 ENCOUNTER — Ambulatory Visit: Payer: Medicare Other | Attending: Physician Assistant | Admitting: Physician Assistant

## 2020-12-30 VITALS — BP 133/77 | HR 67 | Resp 16 | Wt 223.2 lb

## 2020-12-30 DIAGNOSIS — Z794 Long term (current) use of insulin: Secondary | ICD-10-CM | POA: Diagnosis not present

## 2020-12-30 DIAGNOSIS — Z791 Long term (current) use of non-steroidal anti-inflammatories (NSAID): Secondary | ICD-10-CM | POA: Insufficient documentation

## 2020-12-30 DIAGNOSIS — Z7982 Long term (current) use of aspirin: Secondary | ICD-10-CM | POA: Diagnosis not present

## 2020-12-30 DIAGNOSIS — E1165 Type 2 diabetes mellitus with hyperglycemia: Secondary | ICD-10-CM | POA: Diagnosis not present

## 2020-12-30 DIAGNOSIS — N1832 Chronic kidney disease, stage 3b: Secondary | ICD-10-CM

## 2020-12-30 DIAGNOSIS — Z7182 Exercise counseling: Secondary | ICD-10-CM | POA: Insufficient documentation

## 2020-12-30 DIAGNOSIS — E785 Hyperlipidemia, unspecified: Secondary | ICD-10-CM | POA: Diagnosis not present

## 2020-12-30 DIAGNOSIS — Z79899 Other long term (current) drug therapy: Secondary | ICD-10-CM | POA: Insufficient documentation

## 2020-12-30 DIAGNOSIS — E11621 Type 2 diabetes mellitus with foot ulcer: Secondary | ICD-10-CM | POA: Insufficient documentation

## 2020-12-30 DIAGNOSIS — I10 Essential (primary) hypertension: Secondary | ICD-10-CM | POA: Diagnosis not present

## 2020-12-30 DIAGNOSIS — Z7984 Long term (current) use of oral hypoglycemic drugs: Secondary | ICD-10-CM | POA: Diagnosis not present

## 2020-12-30 DIAGNOSIS — L97509 Non-pressure chronic ulcer of other part of unspecified foot with unspecified severity: Secondary | ICD-10-CM | POA: Insufficient documentation

## 2020-12-30 DIAGNOSIS — Z792 Long term (current) use of antibiotics: Secondary | ICD-10-CM | POA: Diagnosis not present

## 2020-12-30 LAB — POCT GLYCOSYLATED HEMOGLOBIN (HGB A1C): HbA1c, POC (controlled diabetic range): 11.7 % — AB (ref 0.0–7.0)

## 2020-12-30 LAB — GLUCOSE, POCT (MANUAL RESULT ENTRY): POC Glucose: 235 mg/dl — AB (ref 70–99)

## 2020-12-30 MED ORDER — BASAGLAR KWIKPEN 100 UNIT/ML ~~LOC~~ SOPN
15.0000 [IU] | PEN_INJECTOR | Freq: Every day | SUBCUTANEOUS | 3 refills | Status: DC
  Filled 2020-12-30: qty 6, 40d supply, fill #0

## 2020-12-30 MED ORDER — INSULIN PEN NEEDLE 31G X 5 MM MISC
1.0000 | Freq: Every day | 1 refills | Status: DC
  Filled 2020-12-30: qty 100, 90d supply, fill #0

## 2020-12-30 NOTE — Patient Outreach (Signed)
[facility]) Care Management  12/30/2020  [name] Joseph [name] [birth year] [id number]   [facility] follow up for complex care referral   Referral Date: 10/08/20 Referral Reason: to be placed in complex, chronic or disease mgmt. Insurance: medicare   [facility] admissions   11/20/18 to 12/04/18 In patient rehab right below knee amputee, anemia, CHF, CKD 11/13/18 to 11/20/18 sepsis with acute renal failure, diabetes, HTN, acute renal failure superimposed on stage 2 chronic kidney disease   Patient is able to verify HIPAA ([place]) identifiers Reviewed and addressed the purpose of the follow up call with the patient   Consent: [facility] ([place]) RN CM reviewed [facility] services with patient. Patient gave verbal consent for services.  Assessment He was seen by his primary care provider (PCP), A McClung, today  He was able to meet with a pcp RN CM, Opal Sidles He follows up in 3 weeks with pcp plus plans to return to provide his completed medicaid forms answered questions and encouragement provided   social determinants of health (SDOH) Utilities He did outreach to his last assigned Department of Social Services (DSS) staff to review his concerns. 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)    Leukocytosis    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[place]) 11/20/2018   Atrial  fibrillation (HCC)    Diabetic polyneuropathy associated with type 2 diabetes mellitus ([place])    PVD (peripheral vascular disease) ([place])    Critical lower limb ischemia (HCC)    Sepsis with acute renal failure ([place]) 11/13/2018   Acute renal failure superimposed on stage 2 chronic kidney disease ([place])    'light-for-dates' infant with signs of fetal malnutrition 10/17/2018   DM (diabetes mellitus) ([place]) 07/02/2012   Hypertension associated with diabetes ([place]) 07/02/2012   Progression Discussed future transferring of services to pcp RN CM, Opal Sidles  He agrees to follow up in 30 business days with future transfer to pcp RN CM after further    Plans Patient agrees to care plan and follow up within the next 30 business days Pt encouraged to return a call to [facility] RN CM prn   Goals Addressed               This Visit's Progress     Patient Stated     [facility]) Find Help in My Community (pt-stated)   On track     Timeframe:  Long-Range Goal Priority:  Medium Start Date:           10/20/20                  Expected End Date:          12/25/20             Follow Up Date 02/03/21 Barriers: Knowledge   - begin a notebook of services in my  neighborhood or community - call 211 when I need some help - follow-up on any referrals for help I am given - make a list of family or friends that I can call   Notes:  12/30/20 continues to go to food pantry, staying in touch with vocational rehab, connected on 12/30/20 with pcp RN CM, continues to work with DSS on his utility bill payment 11/18/20 able to speak briefly, confirms had outreach from vocational rehab staff about home improvements, has received medicaid application 01/30/91 received BP cuff from [facility], monitoring, made new pcp appointment with A McClung PAC for 12/02/20 Pt to further Outreach to 211, Department of Social Services (DSS) for [employer] application, follow up on resources provided Review materials/resources being sent to him via mail  10/27/20 pt  has not made new pcp appointment- encouraged to complete this task 10/22/20 had outreach from [facility] care guide for resources for meals -referral to [place] via e-mail to [employer] at [employer].  Patient was shopping and unable to write down information for Vocational Rehab Services for assistance with bathroom modifications. A message was left on patient's  voicemail  with the number to call per patient request Care guide will follow up with patient by phone over the next day 10/20/20 voiced interest in financial resources, meals on wheels/food resources. Community resources, home improvements in bathroom      COMPLETED: [facility]) Make and Keep All Appointments (pt-stated)   On track     Timeframe:  Short-Term Goal Priority:  High Start Date:                   10/20/20          Expected End Date:     01/11/21                  Follow Up Date 12/30/20 Barriers: Knowledge   - call to cancel if needed - keep a calendar with appointment dates    Notes:  12/30/20 goal met kept new pcp appointment today, established care and has follow up with pcp, pcp pharmacy and pcp RN cm  11/18/20 able to speak briefly, pending new PCP appointment on 12/02/20, has not cancelled 11/03/20 made new pcp appointment with A McClung PAC for 12/02/20 Pt to further  To call [place] about refills on Norvasc and Atorvastatin 10/27/20 pt had not made new pcp appointment- encouraged to complete this task At end of 10/27/20 pt did schedule his new patient appointment with [facility] for 12/02/20 10/20/20 Has not been seen by pcp nor DME provider since 2020. He reports he will make appointments as his finances will allow       [facility]) Monitor and Manage My Blood Sugar-Diabetes Type 2 (pt-stated)   On track     Timeframe:  Long-Range Goal Priority:  High Start Date:              10/20/20               Expected End Date:       02/26/21               Follow Up Date 02/03/21 Barriers: Knowledge   - check blood sugar at prescribed times - check  blood sugar if I feel it is too high or too low    Notes:   12/30/20 Pt admits to needing a change in he diet and exercise after an  HgbA1c of 11.7 today vs 9.8 on 02/06/20 Left foot  diabetic wound- followed by Dr Lanae Crumbly, podiatry. He reports he changes the bandage now one to two days. Follows up on January 11 2021 11/03/20 monitoring home cbg Reports he believes he has DM under control Denies cbg elevations or lows made aware of last HgA1c of 9.8 Now with confirmed meals on wheels daily delivers from 02-1129 am 10/20/20 Denies issues with cbg but diet management      COMPLETED: [facility]) Track and Manage My Blood Pressure-Hypertension (pt-stated)   On track     Timeframe:  Short-Term Goal Priority:  Medium Start Date:                        10/20/20     Expected End Date:              12/26/20         Complete date 12/30/20 Barriers: Knowledge    - check blood pressure weekly - choose a place to take my blood pressure (home, clinic or office, retail store)     Notes:  12/30/20 BP was 133/77 today He is checking at home now Goal met  11/03/20 confirms he received [facility] BP cuff mailed is monitoring at home weekly Noted SBP elevation in 150s. To call [facility] about refills on Norvasc and Atorvastatin. Given 24 hr nurse number 10/20/20 reports poor working BP cuff in home Agrees to BP cuff assistance        Kimberly L. Lavina Hamman, RN, BSN, [place] Coordinator Office number [phone] [facility] number [phone] Fax number [phone]

## 2020-12-30 NOTE — Telephone Encounter (Signed)
Met with the patient when he was in the clinic today.  He was concerned about the cost of his insulin. He has no insurance drug benefit.   Explained to him that [facility] has patient assistance programs that he may qualify for with some medication at little to no charge. He completed and application for [facility] and will get his basaglar at no charge.  Today he opted for a one time free fill for all medications he was having filled as well as the pen needles.  He said he will need to budget his income going forward to allow for medication costs.  He did confirm that he knows how to administer the insulin.  He is on a fixed income and is trying to budget his money despite the increase in cost of living expenses. He said that he has information about local food banks and has been getting food from [employer].

## 2020-12-30 NOTE — Progress Notes (Signed)
Patient ID: Joseph [name], male   DOB: 1945/07/18, 75 y.o.   MRN: [id number]   [place] Scroggin, is a 75 y.o. 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  He has RF on most of his meds.  Blood sugars running from 140-250s(but I am unsure if he is checking them regularly).  Denies any CP/SOB/palpitations/polyuria/polydipsia.  Saw podiatry this week for improving foot ulcer  ROS:   Constitutional:  No f/c, No night sweats, No unexplained weight loss. EENT:  No vision changes, No blurry vision, No hearing changes. No mouth, throat, or ear problems.  Respiratory: No cough, No SOB Cardiac: No CP, no palpitations GI:  No abd pain, No N/V/D. GU: No Urinary s/sx Musculoskeletal: No joint pain Neuro: No headache, no dizziness, no motor weakness.  Skin: No rash Endocrine:  No polydipsia. No polyuria.  Psych: Denies SI/HI  No problems updated.  ALLERGIES: No Known Allergies  PAST MEDICAL HISTORY: Past Medical History:  Diagnosis Date   Cellulitis of right lower extremity    Diabetes mellitus type 2 in nonobese (HCC)    Gangrene of right foot ([place])    Hypertension    Open wound of right foot     MEDICATIONS AT HOME: Prior to Admission medications   Medication Sig Start Date End Date Taking? Authorizing Provider  Insulin Glargine (BASAGLAR KWIKPEN) 100 UNIT/ML Inject 15 Units into the skin daily. 12/30/20  Yes Renner Sebald M, PA-C  Insulin Pen Needle (PEN NEEDLES) 31G X 5 MM MISC 1 each by Does not apply route daily. 12/30/20  Yes Freeman Caldron M, PA-C  Accu-Chek FastClix Lancets MISC Use to check blood sugars three times per day 05/17/19   Fulp, Cammie, MD  amLODipine (NORVASC) 10 MG tablet Take 1 tablet (10 mg total) by mouth daily. 12/02/20   Argentina Donovan, PA-C  amoxicillin-clavulanate (AUGMENTIN) 875-125 MG tablet Take 1 tablet by mouth 2 (two) times daily. 12/08/20    Criselda Peaches, DPM  aspirin EC 81 MG tablet Take 81 mg by mouth daily. Swallow whole.    [provider]  atorvastatin (LIPITOR) 10 MG tablet Take 1 tablet (10 mg total) by mouth daily. In the evening 12/02/20   Argentina Donovan, PA-C  Blood Glucose Monitoring Suppl (ACCU-CHEK GUIDE ME) w/Device KIT 1 kit by Does not apply route 3 (three) times daily. To check blood sugars 05/17/19   Fulp, Cammie, MD  diclofenac sodium (VOLTAREN) 1 % GEL Apply 2 g topically 3 (three) times daily. 12/03/18   Angiulli, Lavon Paganini, PA-C  glipiZIDE (GLUCOTROL) 10 MG tablet TAKE 1 TABLET (10 MG TOTAL) BY MOUTH 2 (TWO) TIMES DAILY BEFORE A MEAL. 12/02/20 12/02/21  Argentina Donovan, PA-C  glucose blood (ACCU-CHEK GUIDE) test strip Use as instructed 05/17/19   Fulp, Cammie, MD  hydrochlorothiazide (MICROZIDE) 12.5 MG capsule Take 1 capsule (12.5 mg total) by mouth daily. 12/02/20   Argentina Donovan, PA-C  Lancets Misc. (ACCU-CHEK FASTCLIX LANCET) KIT Use when checking blood sugars three times daily 05/17/19   Fulp, Cammie, MD  Multiple Vitamins-Minerals (ONE-A-DAY MENS 50+ ADVANTAGE) TABS Take 1 tablet by mouth daily with breakfast.    [provider]  mupirocin ointment (BACTROBAN) 2 % Apply 1 application topically 2 (two) times daily. 12/02/20   Argentina Donovan, PA-C  oxyCODONE (OXY IR/ROXICODONE) 5 MG immediate release tablet Take 1 tablet (  5 mg total) by mouth 2 (two) times daily as needed for severe pain (pain score 7-10). 03/19/19   Suzan Slick, NP  polyethylene glycol (MIRALAX / GLYCOLAX) 17 g packet Take 17 g by mouth daily as needed for mild constipation. 11/20/18   Georgette Shell, MD     Objective:  EXAM:   Vitals:   12/30/20 1035  BP: 133/77  Pulse: 67  Resp: 16  SpO2: 99%  Weight: 223 lb 3.2 oz (101.2 kg)    General appearance : A&OX3. NAD. Non-toxic-appearing HEENT: Atraumatic and Normocephalic.  PERRLA. EOM intact.  Chest/Lungs:  Breathing-non-labored, Good air entry  bilaterally, breath sounds normal without rales, rhonchi, or wheezing  CVS: S1 S2 regular, no murmurs, gallops, rubs  Neurology:  CN II-XII grossly intact, Non focal.   Psych:  TP difficult to follow. J/I fair. Normal speech. Appropriate eye contact and blunted affect.  Skin:  No Rash  Data Review Lab Results  Component Value Date   HGBA1C 11.7 (A) 12/30/2020   HGBA1C 9.8 (A) 02/06/2020   HGBA1C 11.3 (H) 05/06/2019     Assessment & Plan   1. Type 2 diabetes mellitus with hyperglycemia, without long-term current use of insulin (HCC) Uncontrolled-continued glipizide 10 bid.  GFR won't tolerate metformin.  Start basaglar.  Had him meet with Acquanetta Belling Ausdall - Glucose (CBG) - HgB A1c - Comprehensive metabolic panel - CBC with Differential/Platelet - Insulin Glargine (BASAGLAR KWIKPEN) 100 UNIT/ML; Inject 15 Units into the skin daily.  Dispense: 15 mL; Refill: 3 - Insulin Pen Needle (PEN NEEDLES) 31G X 5 MM MISC; 1 each by Does not apply route daily.  Dispense: 100 each; Refill: 1  2. Hypertension, unspecified type controlled - Comprehensive metabolic panel  3. Hyperlipidemia, unspecified hyperlipidemia type - Lipid panel   Patient have been counseled extensively about nutrition and exercise  Return for 3 weeks with Lurena Joiner;  3 months-PLEASE ASSIGN HIM TO A PCP(not me).  The patient was given clear instructions to go to ER or return to medical center if symptoms don't improve, worsen or new problems develop. The patient verbalized understanding. The patient was told to call to get lab results if they haven't heard anything in the next week.     Freeman Caldron, PA-C [facility] [place]   12/30/2020, 10:52 AM

## 2020-12-31 LAB — COMPREHENSIVE METABOLIC PANEL
ALT: 12 IU/L (ref 0–44)
AST: 20 IU/L (ref 0–40)
Albumin/Globulin Ratio: 1.2 (ref 1.2–2.2)
Albumin: 4.1 g/dL (ref 3.7–4.7)
Alkaline Phosphatase: 59 IU/L (ref 44–121)
BUN/Creatinine Ratio: 15 (ref 10–24)
BUN: 25 mg/dL (ref 8–27)
Bilirubin Total: 0.3 mg/dL (ref 0.0–1.2)
CO2: 19 mmol/L — ABNORMAL LOW (ref 20–29)
Calcium: 9.7 mg/dL (ref 8.6–10.2)
Chloride: 102 mmol/L (ref 96–106)
Creatinine, Ser: 1.67 mg/dL — ABNORMAL HIGH (ref 0.76–1.27)
Globulin, Total: 3.5 g/dL (ref 1.5–4.5)
Glucose: 229 mg/dL — ABNORMAL HIGH (ref 65–99)
Potassium: 5.1 mmol/L (ref 3.5–5.2)
Sodium: 136 mmol/L (ref 134–144)
Total Protein: 7.6 g/dL (ref 6.0–8.5)
eGFR: 43 mL/min/{1.73_m2} — ABNORMAL LOW (ref >59)

## 2020-12-31 LAB — CBC WITH DIFFERENTIAL/PLATELET
Basophils Absolute: 0.1 10*3/uL (ref 0.0–0.2)
Basos: 1 %
EOS (ABSOLUTE): 0.3 10*3/uL (ref 0.0–0.4)
Eos: 3 %
Hematocrit: 35.6 % — ABNORMAL LOW (ref 37.5–51.0)
Hemoglobin: 11.8 g/dL — ABNORMAL LOW (ref 13.0–17.7)
Immature Grans (Abs): 0 10*3/uL (ref 0.0–0.1)
Immature Granulocytes: 0 %
Lymphocytes Absolute: 4.1 10*3/uL — ABNORMAL HIGH (ref 0.7–3.1)
Lymphs: 42 %
MCH: 29.9 pg (ref 26.6–33.0)
MCHC: 33.1 g/dL (ref 31.5–35.7)
MCV: 90 fL (ref 79–97)
Monocytes Absolute: 0.6 10*3/uL (ref 0.1–0.9)
Monocytes: 6 %
Neutrophils Absolute: 4.7 10*3/uL (ref 1.4–7.0)
Neutrophils: 48 %
Platelets: 232 10*3/uL (ref 150–450)
RBC: 3.95 x10E6/uL — ABNORMAL LOW (ref 4.14–5.80)
RDW: 12.7 % (ref 11.6–15.4)
WBC: 9.8 10*3/uL (ref 3.4–10.8)

## 2020-12-31 LAB — LIPID PANEL
Chol/HDL Ratio: 3 ratio (ref 0.0–5.0)
Cholesterol, Total: 121 mg/dL (ref 100–199)
HDL: 41 mg/dL (ref >39)
LDL Chol Calc (NIH): 61 mg/dL (ref 0–99)
Triglycerides: 104 mg/dL (ref 0–149)
VLDL Cholesterol Cal: 19 mg/dL (ref 5–40)

## 2021-01-09 ENCOUNTER — Other Ambulatory Visit: Payer: Self-pay

## 2021-01-09 ENCOUNTER — Ambulatory Visit
Admission: RE | Admit: 2021-01-09 | Discharge: 2021-01-09 | Disposition: A | Discharge status: Home / Self Care | Payer: Medicare Other | Source: Ambulatory Visit | Attending: Podiatry | Admitting: Podiatry

## 2021-01-09 DIAGNOSIS — M86172 Other acute osteomyelitis, left ankle and foot: Secondary | ICD-10-CM | POA: Diagnosis not present

## 2021-01-09 DIAGNOSIS — R600 Localized edema: Secondary | ICD-10-CM | POA: Diagnosis not present

## 2021-01-09 DIAGNOSIS — I739 Peripheral vascular disease, unspecified: Secondary | ICD-10-CM

## 2021-01-09 DIAGNOSIS — E08621 Diabetes mellitus due to underlying condition with foot ulcer: Secondary | ICD-10-CM

## 2021-01-09 DIAGNOSIS — L97509 Non-pressure chronic ulcer of other part of unspecified foot with unspecified severity: Secondary | ICD-10-CM | POA: Diagnosis not present

## 2021-01-09 DIAGNOSIS — M19072 Primary osteoarthritis, left ankle and foot: Secondary | ICD-10-CM | POA: Diagnosis not present

## 2021-01-09 IMAGING — MR MR TOES*L* WO/W CM
7 of 9 series · 32 of 40 positions shown · IV contrast (20ml Multihance)
Comparison: X-ray [DATE]

CLINICAL DATA: Foot swelling, diabetic, osteomyelitis suspected,
xray done

EXAM:
MRI OF THE LEFT TOES WITHOUT AND WITH CONTRAST
TECHNIQUE: Multiplanar, multisequence MR imaging of the left forefoot was
performed both before and after administration of intravenous
contrast.
CONTRAST:  20mL MULTIHANCE GADOBENATE DIMEGLUMINE 529 MG/ML IV SOLN

[Series 4: T1 · coronal · 3.0mm · 0.29mm/px · 5 of 57 slices shown (1 of 2)]
[im 1/57]
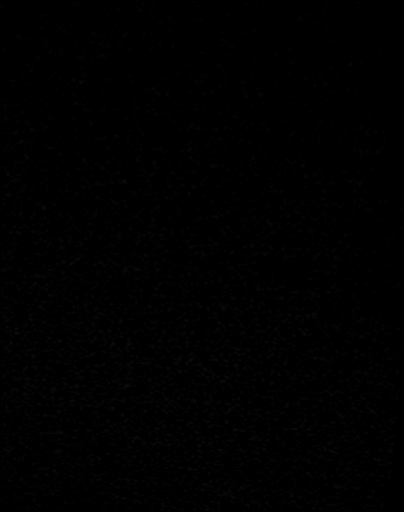
[im 15/57]
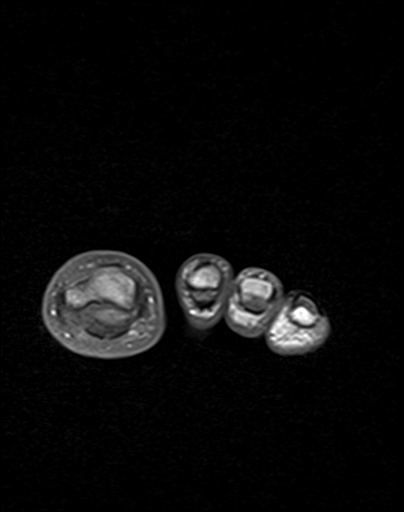
[im 29/57]
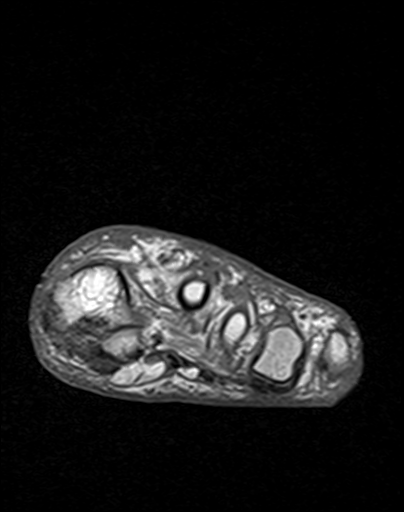
[im 43/57]
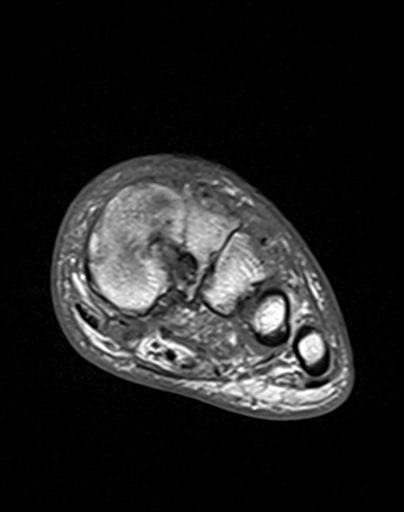
[im 57/57]
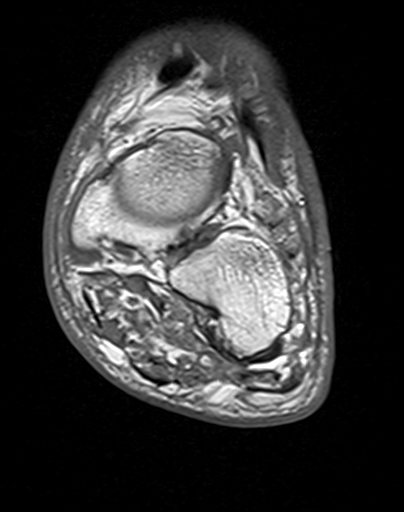

[Series 6: T2 fat-sat · axial · 3.0mm · 0.37mm/px · z∈[-110,-14]mm · 3 of 27 slices shown (1 of 2)]
[im 1/27]
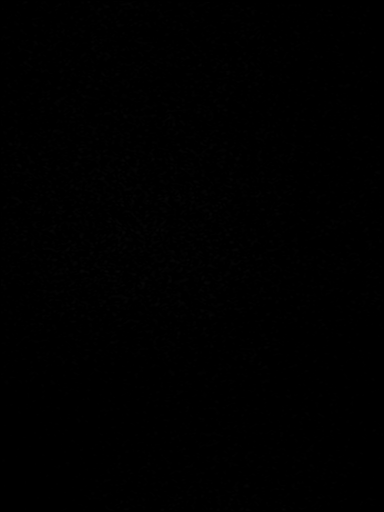
[im 14/27]
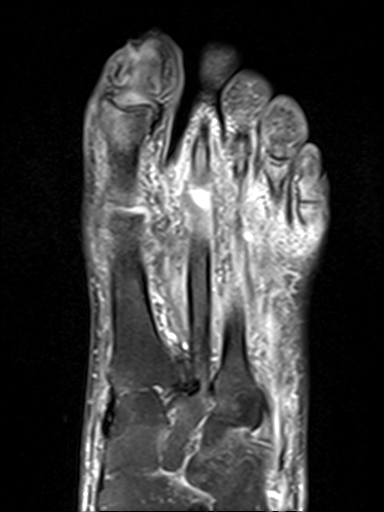
[im 27/27]
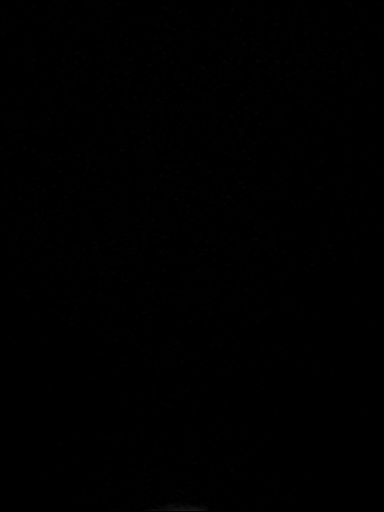

[Series 7: T1 · axial · 3.0mm · 0.37mm/px · z∈[-110,-14]mm · 3 of 27 slices shown (2 of 2)]
[im 1/27]
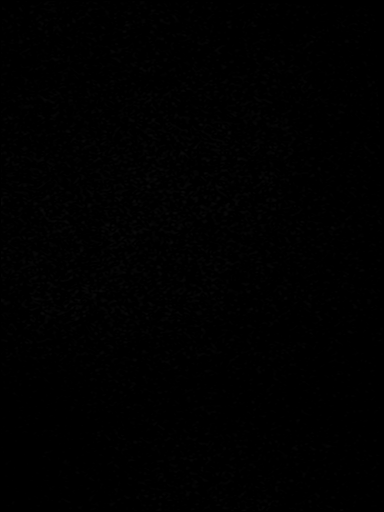
[im 14/27]
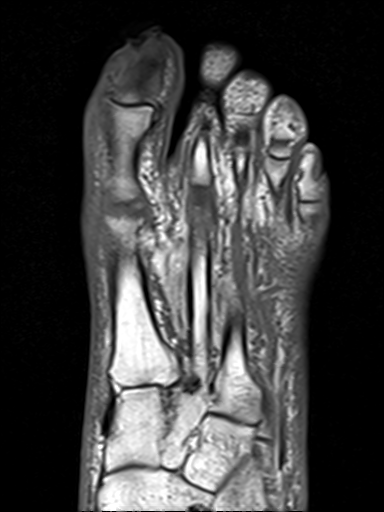
[im 27/27]
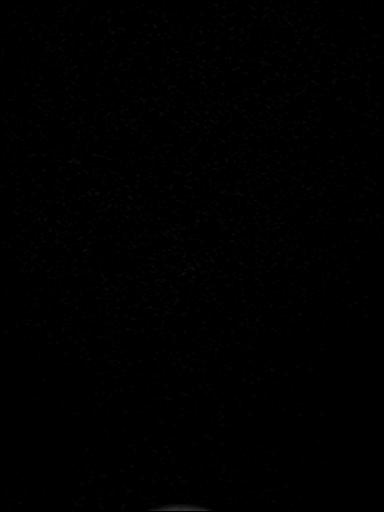

[Series 9: T2 fat-sat · coronal · 3.0mm · 0.28mm/px · 6 of 57 slices shown (2 of 2)]
[im 1/57]
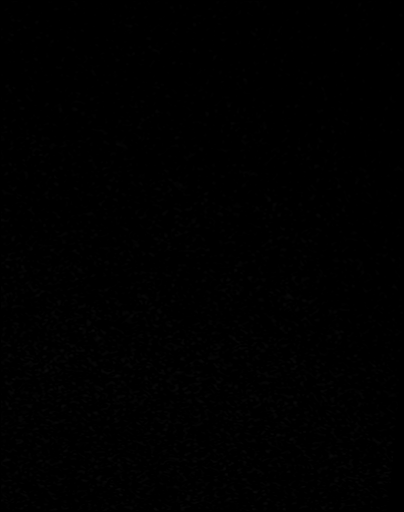
[im 12/57]
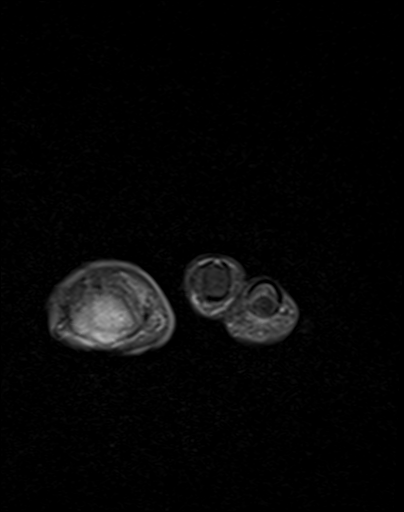
[im 23/57]
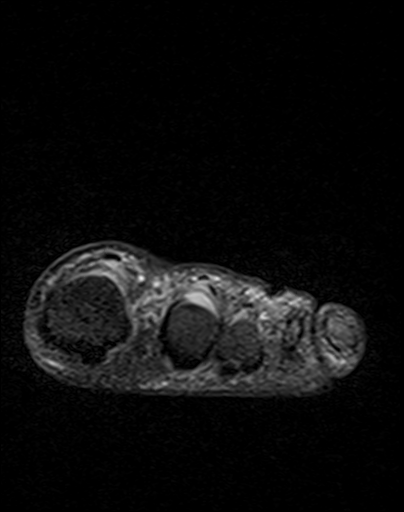
[im 34/57]
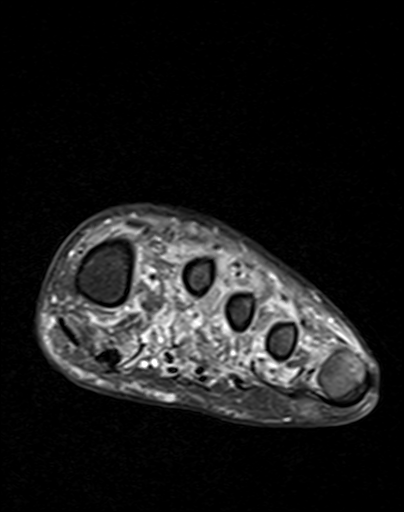
[im 45/57]
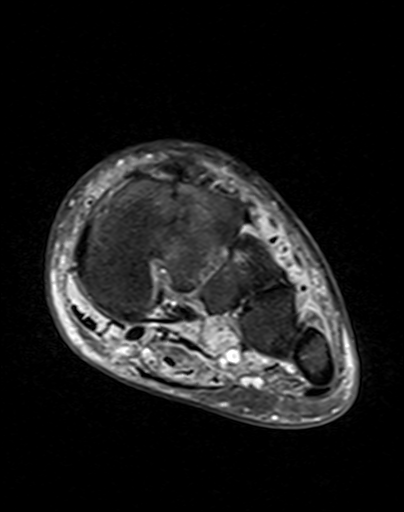
[im 57/57]
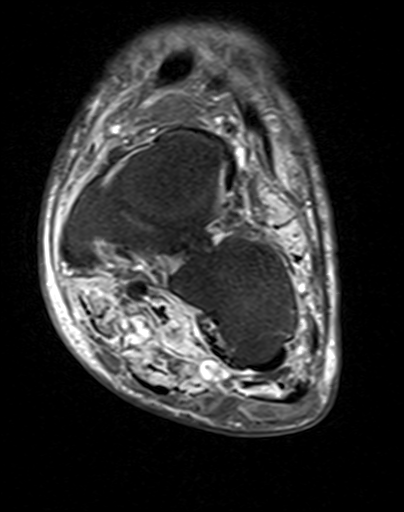

[Series 10: T1 fat-sat · coronal · non-contrast · 3.0mm · 0.59mm/px · 6 of 56 slices shown]
[im 1/56]
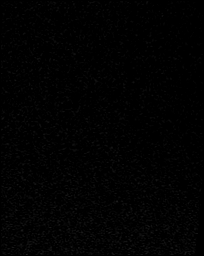
[im 12/56]
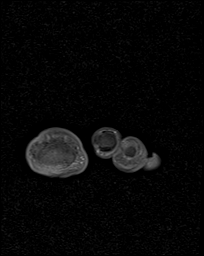
[im 23/56]
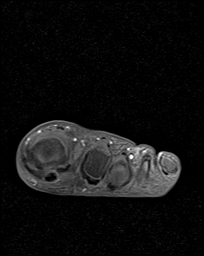
[im 34/56]
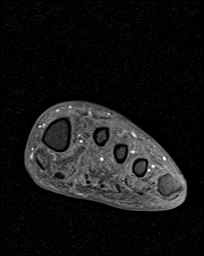
[im 45/56]
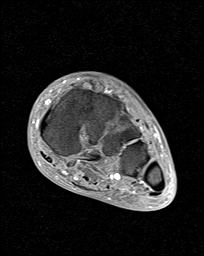
[im 56/56]
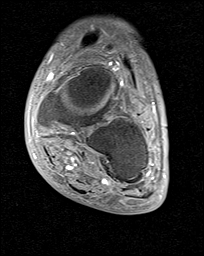

[Series 11: T1 fat-sat post-contrast · coronal · 3.0mm · 0.59mm/px · 6 of 57 slices shown (1 of 2)]
[im 1/57]
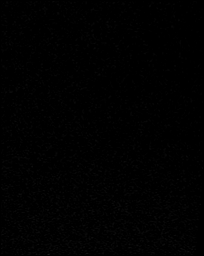
[im 12/57]
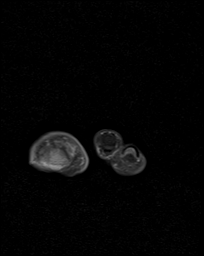
[im 23/57]
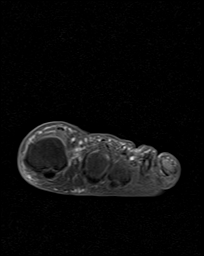
[im 34/57]
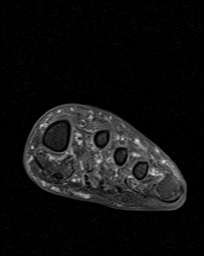
[im 45/57]
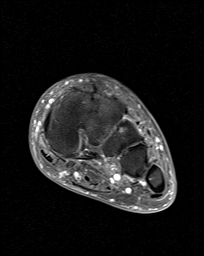
[im 57/57]
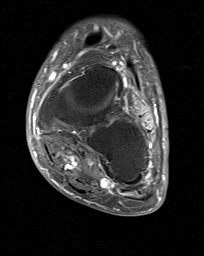

[Series 12: T1 fat-sat post-contrast · axial · 3.0mm · 0.37mm/px · z∈[-110,-14]mm · 3 of 27 slices shown (2 of 2)]
[im 1/27]
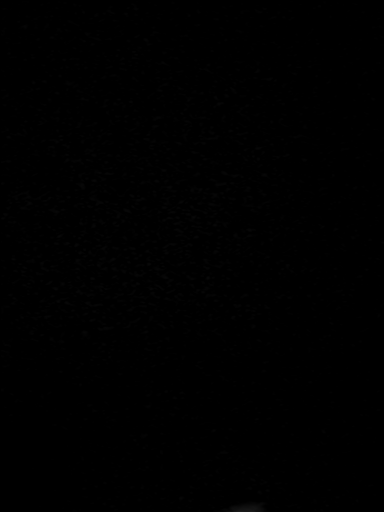
[im 14/27]
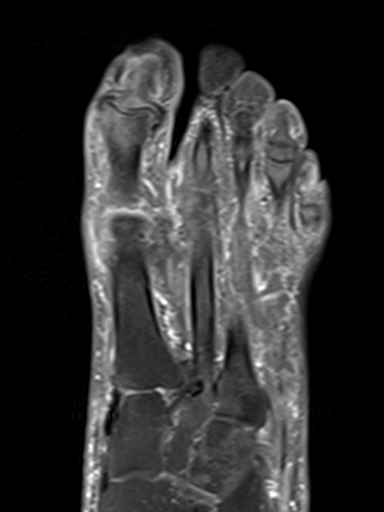
[im 27/27]
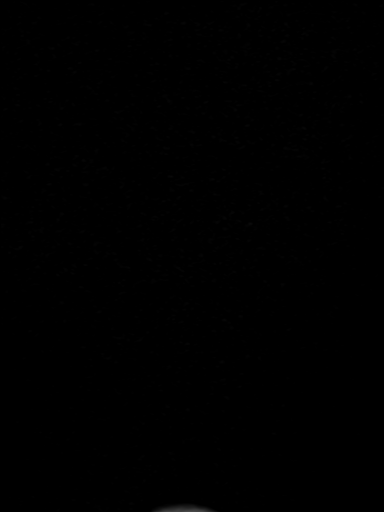

[32 of 40 positions shown; findings below may reference images not displayed]

FINDINGS: Bones/Joint/Cartilage

Soft tissue ulceration at the plantar aspect of the great toe distal
phalanx with marked bone marrow edema throughout the distal phalanx
and associated confluent low T1 marrow signal compatible with acute
osteomyelitis. There is bone marrow edema within the distal 1.5 cm
of the great toe proximal phalanx extending to the interphalangeal
joint with subtle intermediate T1 marrow signal suggestive of
reactive osteitis or early acute osteomyelitis.

Chronic ankylosis of the second tarsometatarsal joint. There is also
ankylosis of the medial and intermediate cuneiform bones. Patchy
edema-like bone marrow signal within the midfoot, which is likely
degenerative/reactive.

Mild degenerative changes involving the midfoot and first MTP joint.
No acute fracture or dislocation.

Ligaments

Collateral ligaments of the distal forefoot appear grossly intact.

Muscles and Tendons

Denervation changes of the intrinsic foot musculature. No
tenosynovial fluid collections.

Soft tissues

Plantar soft tissue ulceration underlies the great toe distal
phalanx. Diffuse soft tissue edema throughout the distal forefoot.
No organized fluid collection.
IMPRESSION: 1. Acute osteomyelitis of the left great toe distal phalanx.
2. Findings suggestive of reactive osteitis or early acute
osteomyelitis involving the proximal phalanx of the great toe.
3. Soft tissue ulceration along the plantar aspect of the great toe.
Diffuse soft tissue edema throughout the distal forefoot, suggesting
cellulitis. No organized fluid collection.
4. Chronic ankylosis of the second tarsometatarsal joint and medial
and intermediate cuneiform bones.

## 2021-01-09 MED ORDER — GADOBENATE DIMEGLUMINE 529 MG/ML IV SOLN
20.0000 mL | Freq: Once | INTRAVENOUS | Status: AC | PRN
  Administered 2021-01-09: 20 mL via INTRAVENOUS

## 2021-01-11 ENCOUNTER — Other Ambulatory Visit: Payer: Self-pay

## 2021-01-11 ENCOUNTER — Ambulatory Visit (INDEPENDENT_AMBULATORY_CARE_PROVIDER_SITE_OTHER): Payer: Medicare Other | Admitting: Podiatry

## 2021-01-11 ENCOUNTER — Encounter: Payer: Self-pay | Admitting: Podiatry

## 2021-01-11 DIAGNOSIS — E1142 Type 2 diabetes mellitus with diabetic polyneuropathy: Secondary | ICD-10-CM | POA: Diagnosis not present

## 2021-01-11 DIAGNOSIS — I739 Peripheral vascular disease, unspecified: Secondary | ICD-10-CM

## 2021-01-11 DIAGNOSIS — E08621 Diabetes mellitus due to underlying condition with foot ulcer: Secondary | ICD-10-CM | POA: Diagnosis not present

## 2021-01-11 DIAGNOSIS — L97523 Non-pressure chronic ulcer of other part of left foot with necrosis of muscle: Secondary | ICD-10-CM | POA: Diagnosis not present

## 2021-01-11 MED ORDER — CADEXOMER IODINE 0.9 % EX GEL: 1.0000 "application " | CUTANEOUS | 0 refills | Status: DC

## 2021-01-11 MED ORDER — DOXYCYCLINE HYCLATE 100 MG PO TABS
100.0000 mg | ORAL_TABLET | Freq: Two times a day (BID) | ORAL | 0 refills | Status: DC
  Filled 2021-01-11: qty 28, 14d supply, fill #0

## 2021-01-11 NOTE — Progress Notes (Signed)
Subjective:  Patient ID: Joseph [name], male    DOB: January 04, 1946,  MRN: [id number]  Chief Complaint  Patient presents with   Diabetic Ulcer      wound care left great toe    75 y.o. male presents with the above complaint. History confirmed with patient.  He did complete the MRI but has not completed his ABIs yet he tried to call but could not get them scheduled he said  Objective:  Physical Exam: warm, good capillary refill.  His pulses are nonpalpable.  Hemosiderosis throughout the lower extremities.  Diffuse neuropathy.  He has a large plantar ulceration measuring 4.0 x 3.0 x 0.4 cm with exposed tendon and fascia.  Unchanged since last visit     Radiographs: Multiple views x-ray of the left foot: no soft tissue emphysema, no signs of osteomyelitis  Study Result  Narrative & Impression  CLINICAL DATA:  Foot swelling, diabetic, osteomyelitis suspected, xray done   EXAM: MRI OF THE LEFT TOES WITHOUT AND WITH CONTRAST   TECHNIQUE: Multiplanar, multisequence MR imaging of the left forefoot was performed both before and after administration of intravenous contrast.   CONTRAST:  79mL MULTIHANCE GADOBENATE DIMEGLUMINE 529 MG/ML IV SOLN   COMPARISON:  X-ray 12/08/2020   FINDINGS: Bones/Joint/Cartilage   Soft tissue ulceration at the plantar aspect of the great toe distal phalanx with marked bone marrow edema throughout the distal phalanx and associated confluent low T1 marrow signal compatible with acute osteomyelitis. There is bone marrow edema within the distal 1.5 cm of the great toe proximal phalanx extending to the interphalangeal joint with subtle intermediate T1 marrow signal suggestive of reactive osteitis or early acute osteomyelitis.   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 There is also ankylosis of the medial and intermediate cuneiform bones. Patchy edema-like bone marrow signal within the midfoot, which is likely degenerative/reactive.   Mild  degenerative changes involving the midfoot and first MTP joint. No acute fracture or dislocation.   Ligaments   Collateral ligaments of the distal forefoot appear grossly intact.   Muscles and Tendons   Denervation changes of the intrinsic foot musculature. No tenosynovial fluid collections.   Soft tissues   Plantar soft tissue ulceration underlies the great toe distal phalanx. Diffuse soft tissue edema throughout the distal forefoot. No organized fluid collection.   IMPRESSION: 1. Acute osteomyelitis of the left great toe distal phalanx. 2. Findings suggestive of reactive osteitis or early acute osteomyelitis involving the proximal phalanx of the great toe. 3. Soft tissue ulceration along the plantar aspect of the great toe. Diffuse soft tissue edema throughout the distal forefoot, suggesting cellulitis. No organized fluid collection. 4. 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     Electronically Signed   By: Duanne Guess D.O.   On: 01/09/2021 15:21    Assessment:   1. 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 PVD (peripheral vascular disease) (HCC)   3. Diabetic polyneuropathy associated with type 2 diabetes mellitus (HCC)        Plan:  Patient was evaluated and treated and all questions answered.  Ulcer left hallux - I reviewed the MRI with him and discussed that he likely will be best with partial hallux amputation through the proximal phalanx.  He obviously was somewhat upset by this but understands the need for this.  I am keeping him on doxycycline until then  and I prescribed this for him. -Deep wound culture of tissue was taken today after debridement -I gave him the contact info to reschedule his ABI and TBI with ultrasound.  If he has difficulty scheduling this he should call my office and let us know so we  can assist -Wound is not acutely infected purulent or at risk of sepsis.  I discussed signs and symptoms of this and he should go to the ER if these develop.  While the wound is stable I would recommend trying to establish revascularization prior to amputation.  But may have to amputate for source control prior to this. -Glycemic control reviewed and he is working on this -Debridement as below. -Dressed with Iodosorb, DSD.  He purchased more Iodosorb today -Continue off-loading with surgical shoe.     Procedure: Excisional Debridement of Wound Rationale: Removal of non-viable soft tissue from the wound to promote healing.  Anesthesia: none Post-Debridement Wound Measurements: 4.0 x 3.0 x 0.4 cm Type of Debridement: Sharp Excisional Tissue Removed: Non-viable soft tissue Depth of Debridement: subcutaneous tissue. Technique: Sharp excisional debridement to bleeding, viable wound base.  Dressing: Dry, sterile, compression dressing. Disposition: Patient tolerated procedure well.       Return in about 2 weeks (around 01/25/2021) for wound care, surgery planning, follow up vascular testing .

## 2021-01-13 ENCOUNTER — Other Ambulatory Visit: Payer: Self-pay

## 2021-01-15 ENCOUNTER — Telehealth: Payer: Self-pay | Admitting: Podiatry

## 2021-01-15 LAB — WOUND CULTURE
MICRO NUMBER:: 12243295
SPECIMEN QUALITY:: ADEQUATE

## 2021-01-15 MED ORDER — CADEXOMER IODINE 0.9 % EX GEL
1.0000 "application " | CUTANEOUS | 3 refills | Status: DC
  Filled 2021-01-15: qty 40, 94d supply, fill #0

## 2021-01-15 NOTE — Telephone Encounter (Signed)
Patient calling to request refill of cadexomer iodine (IODOSORB) 0.9 % gel Please advise.

## 2021-01-18 ENCOUNTER — Other Ambulatory Visit: Payer: Self-pay

## 2021-01-25 ENCOUNTER — Ambulatory Visit (INDEPENDENT_AMBULATORY_CARE_PROVIDER_SITE_OTHER): Payer: Medicare Other | Admitting: Podiatry

## 2021-01-25 ENCOUNTER — Other Ambulatory Visit: Payer: Self-pay

## 2021-01-25 DIAGNOSIS — I739 Peripheral vascular disease, unspecified: Secondary | ICD-10-CM | POA: Diagnosis not present

## 2021-01-25 DIAGNOSIS — E08621 Diabetes mellitus due to underlying condition with foot ulcer: Secondary | ICD-10-CM

## 2021-01-25 DIAGNOSIS — L089 Local infection of the skin and subcutaneous tissue, unspecified: Secondary | ICD-10-CM | POA: Diagnosis not present

## 2021-01-25 DIAGNOSIS — L97523 Non-pressure chronic ulcer of other part of left foot with necrosis of muscle: Secondary | ICD-10-CM | POA: Diagnosis not present

## 2021-01-25 DIAGNOSIS — E11628 Type 2 diabetes mellitus with other skin complications: Secondary | ICD-10-CM

## 2021-01-26 ENCOUNTER — Inpatient Hospital Stay (HOSPITAL_COMMUNITY)
Admission: EM | Admit: 2021-01-26 | Discharge: 2021-01-28 | DRG: 300 | Disposition: A | Discharge status: Home / Self Care | Payer: Medicare Other | Source: Ambulatory Visit | Attending: Family Medicine | Admitting: Family Medicine

## 2021-01-26 ENCOUNTER — Other Ambulatory Visit: Payer: Self-pay

## 2021-01-26 ENCOUNTER — Emergency Department (HOSPITAL_COMMUNITY): Payer: Medicare Other

## 2021-01-26 ENCOUNTER — Emergency Department (HOSPITAL_BASED_OUTPATIENT_CLINIC_OR_DEPARTMENT_OTHER): Payer: Medicare Other

## 2021-01-26 DIAGNOSIS — Z5329 Procedure and treatment not carried out because of patient's decision for other reasons: Secondary | ICD-10-CM | POA: Diagnosis not present

## 2021-01-26 DIAGNOSIS — E1142 Type 2 diabetes mellitus with diabetic polyneuropathy: Secondary | ICD-10-CM | POA: Diagnosis present

## 2021-01-26 DIAGNOSIS — Z7982 Long term (current) use of aspirin: Secondary | ICD-10-CM | POA: Diagnosis not present

## 2021-01-26 DIAGNOSIS — Z89511 Acquired absence of right leg below knee: Secondary | ICD-10-CM | POA: Diagnosis not present

## 2021-01-26 DIAGNOSIS — I4891 Unspecified atrial fibrillation: Secondary | ICD-10-CM | POA: Diagnosis present

## 2021-01-26 DIAGNOSIS — M19072 Primary osteoarthritis, left ankle and foot: Secondary | ICD-10-CM | POA: Diagnosis not present

## 2021-01-26 DIAGNOSIS — I70245 Atherosclerosis of native arteries of left leg with ulceration of other part of foot: Secondary | ICD-10-CM | POA: Diagnosis not present

## 2021-01-26 DIAGNOSIS — L0390 Cellulitis, unspecified: Secondary | ICD-10-CM | POA: Diagnosis not present

## 2021-01-26 DIAGNOSIS — Z79899 Other long term (current) drug therapy: Secondary | ICD-10-CM

## 2021-01-26 DIAGNOSIS — E11621 Type 2 diabetes mellitus with foot ulcer: Secondary | ICD-10-CM | POA: Diagnosis present

## 2021-01-26 DIAGNOSIS — B964 Proteus (mirabilis) (morganii) as the cause of diseases classified elsewhere: Secondary | ICD-10-CM | POA: Diagnosis present

## 2021-01-26 DIAGNOSIS — M869 Osteomyelitis, unspecified: Secondary | ICD-10-CM | POA: Diagnosis present

## 2021-01-26 DIAGNOSIS — E785 Hyperlipidemia, unspecified: Secondary | ICD-10-CM | POA: Diagnosis present

## 2021-01-26 DIAGNOSIS — M86172 Other acute osteomyelitis, left ankle and foot: Secondary | ICD-10-CM | POA: Diagnosis not present

## 2021-01-26 DIAGNOSIS — N182 Chronic kidney disease, stage 2 (mild): Secondary | ICD-10-CM | POA: Diagnosis present

## 2021-01-26 DIAGNOSIS — I70262 Atherosclerosis of native arteries of extremities with gangrene, left leg: Secondary | ICD-10-CM | POA: Diagnosis present

## 2021-01-26 DIAGNOSIS — Z833 Family history of diabetes mellitus: Secondary | ICD-10-CM | POA: Diagnosis not present

## 2021-01-26 DIAGNOSIS — I739 Peripheral vascular disease, unspecified: Secondary | ICD-10-CM | POA: Diagnosis not present

## 2021-01-26 DIAGNOSIS — E1152 Type 2 diabetes mellitus with diabetic peripheral angiopathy with gangrene: Principal | ICD-10-CM | POA: Diagnosis present

## 2021-01-26 DIAGNOSIS — L97526 Non-pressure chronic ulcer of other part of left foot with bone involvement without evidence of necrosis: Secondary | ICD-10-CM | POA: Diagnosis present

## 2021-01-26 DIAGNOSIS — I771 Stricture of artery: Secondary | ICD-10-CM | POA: Diagnosis present

## 2021-01-26 DIAGNOSIS — E1169 Type 2 diabetes mellitus with other specified complication: Secondary | ICD-10-CM | POA: Diagnosis present

## 2021-01-26 DIAGNOSIS — I129 Hypertensive chronic kidney disease with stage 1 through stage 4 chronic kidney disease, or unspecified chronic kidney disease: Secondary | ICD-10-CM | POA: Diagnosis present

## 2021-01-26 DIAGNOSIS — L089 Local infection of the skin and subcutaneous tissue, unspecified: Secondary | ICD-10-CM | POA: Diagnosis not present

## 2021-01-26 DIAGNOSIS — E1122 Type 2 diabetes mellitus with diabetic chronic kidney disease: Secondary | ICD-10-CM | POA: Diagnosis present

## 2021-01-26 DIAGNOSIS — I83225 Varicose veins of left lower extremity with both ulcer other part of foot and inflammation: Secondary | ICD-10-CM | POA: Diagnosis present

## 2021-01-26 DIAGNOSIS — Z794 Long term (current) use of insulin: Secondary | ICD-10-CM

## 2021-01-26 DIAGNOSIS — Z7984 Long term (current) use of oral hypoglycemic drugs: Secondary | ICD-10-CM | POA: Diagnosis not present

## 2021-01-26 DIAGNOSIS — L98499 Non-pressure chronic ulcer of skin of other sites with unspecified severity: Secondary | ICD-10-CM | POA: Diagnosis not present

## 2021-01-26 DIAGNOSIS — B961 Klebsiella pneumoniae [K. pneumoniae] as the cause of diseases classified elsewhere: Secondary | ICD-10-CM | POA: Diagnosis present

## 2021-01-26 DIAGNOSIS — I130 Hypertensive heart and chronic kidney disease with heart failure and stage 1 through stage 4 chronic kidney disease, or unspecified chronic kidney disease: Secondary | ICD-10-CM | POA: Diagnosis not present

## 2021-01-26 DIAGNOSIS — Z4781 Encounter for orthopedic aftercare following surgical amputation: Secondary | ICD-10-CM | POA: Diagnosis not present

## 2021-01-26 DIAGNOSIS — Z20822 Contact with and (suspected) exposure to covid-19: Secondary | ICD-10-CM | POA: Diagnosis present

## 2021-01-26 DIAGNOSIS — Z872 Personal history of diseases of the skin and subcutaneous tissue: Secondary | ICD-10-CM | POA: Diagnosis not present

## 2021-01-26 DIAGNOSIS — D72829 Elevated white blood cell count, unspecified: Secondary | ICD-10-CM | POA: Diagnosis present

## 2021-01-26 DIAGNOSIS — Z87891 Personal history of nicotine dependence: Secondary | ICD-10-CM

## 2021-01-26 DIAGNOSIS — S91102A Unspecified open wound of left great toe without damage to nail, initial encounter: Secondary | ICD-10-CM | POA: Diagnosis not present

## 2021-01-26 DIAGNOSIS — D62 Acute posthemorrhagic anemia: Secondary | ICD-10-CM | POA: Diagnosis not present

## 2021-01-26 DIAGNOSIS — Z89512 Acquired absence of left leg below knee: Secondary | ICD-10-CM | POA: Diagnosis not present

## 2021-01-26 DIAGNOSIS — Z720 Tobacco use: Secondary | ICD-10-CM | POA: Diagnosis not present

## 2021-01-26 LAB — COMPREHENSIVE METABOLIC PANEL
ALT: 14 U/L (ref 0–44)
AST: 18 U/L (ref 15–41)
Albumin: 3 g/dL — ABNORMAL LOW (ref 3.5–5.0)
Alkaline Phosphatase: 55 U/L (ref 38–126)
Anion gap: 8 (ref 5–15)
BUN: 26 mg/dL — ABNORMAL HIGH (ref 8–23)
CO2: 23 mmol/L (ref 22–32)
Calcium: 9.4 mg/dL (ref 8.9–10.3)
Chloride: 108 mmol/L (ref 98–111)
Creatinine, Ser: 1.54 mg/dL — ABNORMAL HIGH (ref 0.61–1.24)
GFR, Estimated: 47 mL/min — ABNORMAL LOW (ref >60)
Glucose, Bld: 121 mg/dL — ABNORMAL HIGH (ref 70–99)
Potassium: 4.6 mmol/L (ref 3.5–5.1)
Sodium: 139 mmol/L (ref 135–145)
Total Bilirubin: 0.7 mg/dL (ref 0.3–1.2)
Total Protein: 7.3 g/dL (ref 6.5–8.1)

## 2021-01-26 LAB — CBC WITH DIFFERENTIAL/PLATELET
Abs Immature Granulocytes: 0.08 10*3/uL — ABNORMAL HIGH (ref 0.00–0.07)
Basophils Absolute: 0 10*3/uL (ref 0.0–0.1)
Basophils Relative: 0 %
Eosinophils Absolute: 0.2 10*3/uL (ref 0.0–0.5)
Eosinophils Relative: 1 %
HCT: 31.8 % — ABNORMAL LOW (ref 39.0–52.0)
Hemoglobin: 10.1 g/dL — ABNORMAL LOW (ref 13.0–17.0)
Immature Granulocytes: 1 %
Lymphocytes Relative: 27 %
Lymphs Abs: 4.6 10*3/uL — ABNORMAL HIGH (ref 0.7–4.0)
MCH: 30.1 pg (ref 26.0–34.0)
MCHC: 31.8 g/dL (ref 30.0–36.0)
MCV: 94.6 fL (ref 80.0–100.0)
Monocytes Absolute: 1.4 10*3/uL — ABNORMAL HIGH (ref 0.1–1.0)
Monocytes Relative: 8 %
Neutro Abs: 10.9 10*3/uL — ABNORMAL HIGH (ref 1.7–7.7)
Neutrophils Relative %: 63 %
Platelets: 315 10*3/uL (ref 150–400)
RBC: 3.36 MIL/uL — ABNORMAL LOW (ref 4.22–5.81)
RDW: 12.9 % (ref 11.5–15.5)
WBC: 17.2 10*3/uL — ABNORMAL HIGH (ref 4.0–10.5)
nRBC: 0 % (ref 0.0–0.2)

## 2021-01-26 LAB — SARS CORONAVIRUS 2 (TAT 6-24 HRS): SARS Coronavirus 2: NEGATIVE

## 2021-01-26 LAB — CBG MONITORING, ED: Glucose-Capillary: 122 mg/dL — ABNORMAL HIGH (ref 70–99)

## 2021-01-26 LAB — GLUCOSE, CAPILLARY: Glucose-Capillary: 76 mg/dL (ref 70–99)

## 2021-01-26 LAB — LACTIC ACID, PLASMA: Lactic Acid, Venous: 0.8 mmol/L (ref 0.5–1.9)

## 2021-01-26 IMAGING — DX DG CHEST 2V
2 series · 2 of 2 positions shown · non-contrast
Comparison: None.

CLINICAL DATA: right toe infection

EXAM:
CHEST - 2 VIEW

[w chest pa]
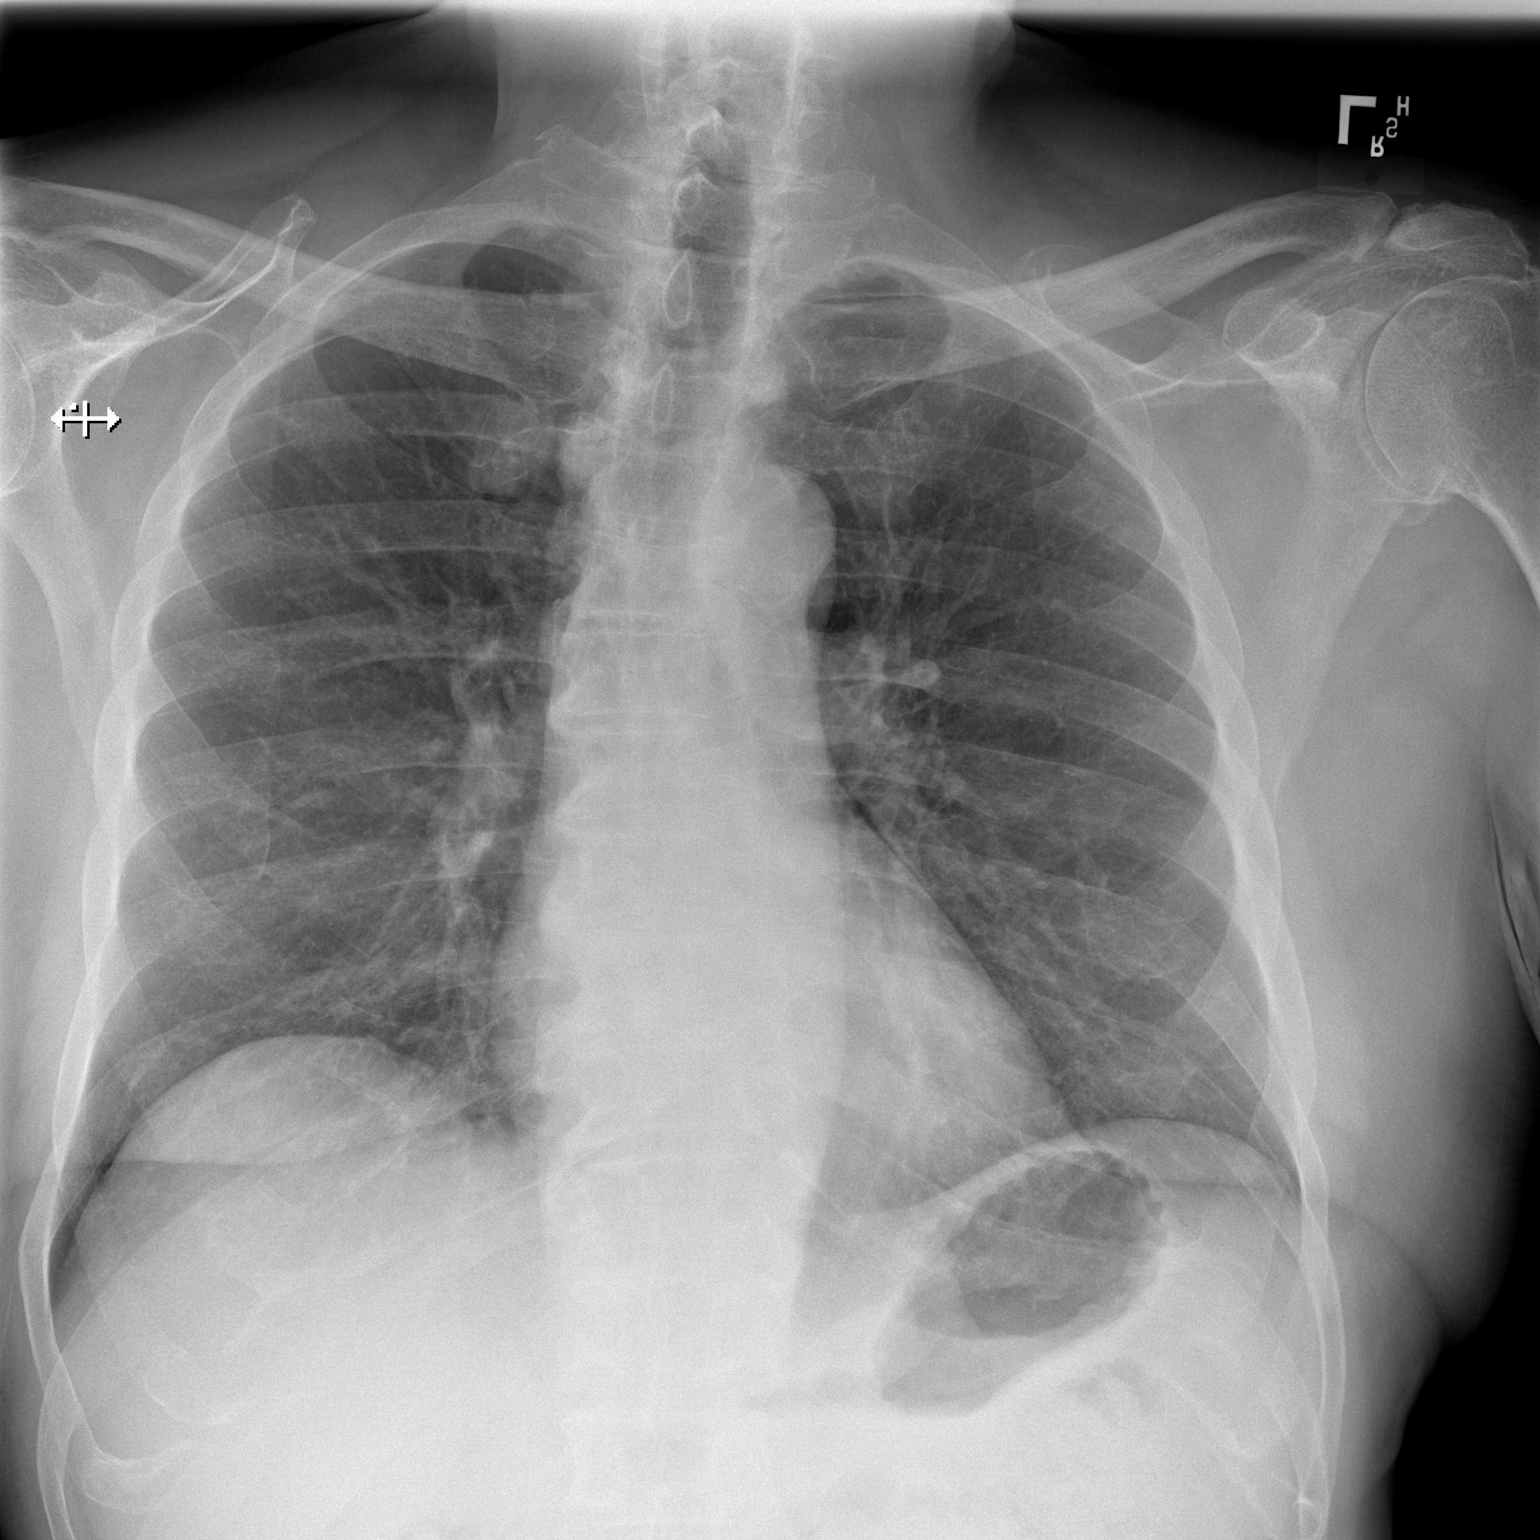

[w chest lat]
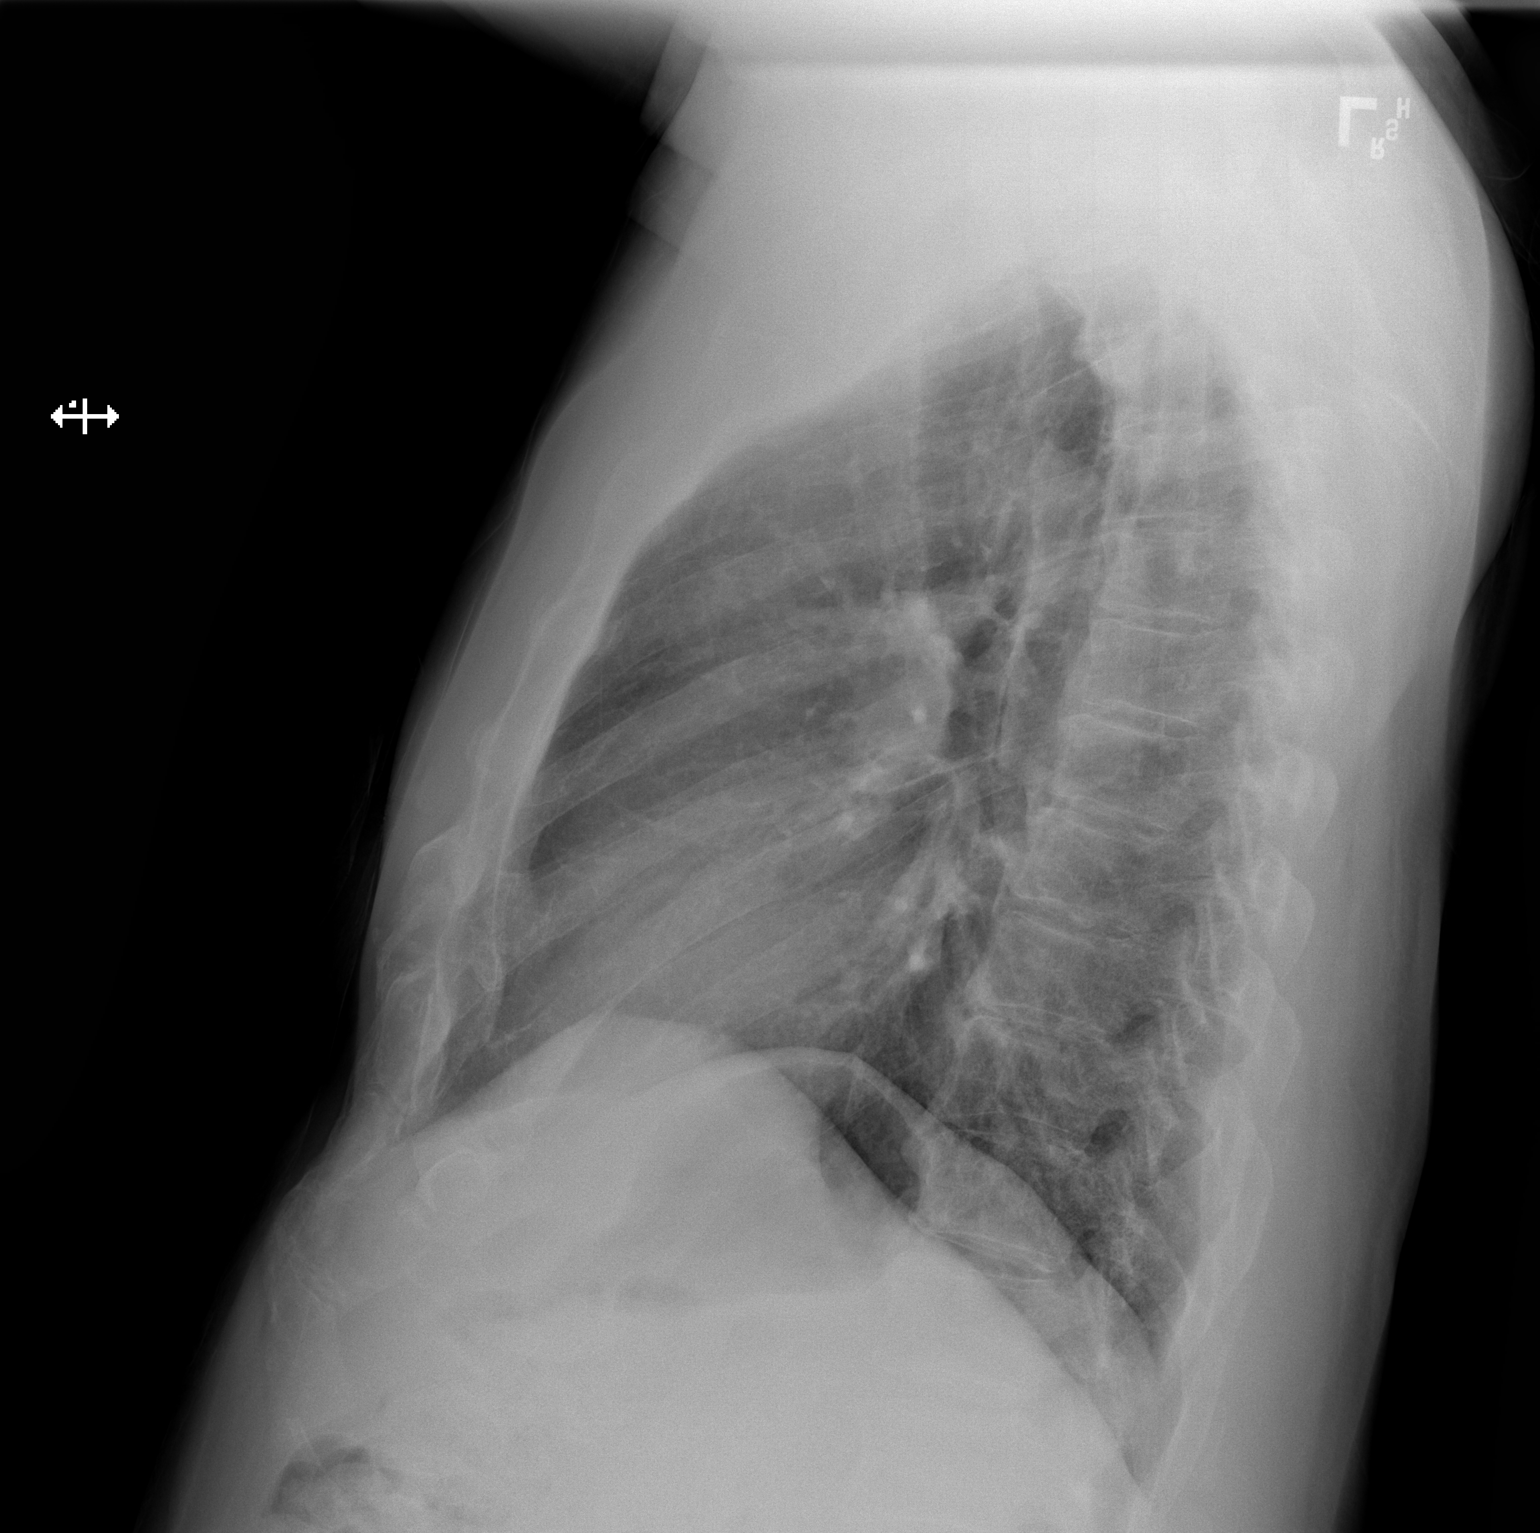

[2 of 2 positions shown; findings below may reference images not displayed]

FINDINGS: The cardiomediastinal silhouette is within normal limits. No pleural
effusion. No pneumothorax. No mass or consolidation. No acute
osseous abnormality.
IMPRESSION: No active cardiopulmonary disease.

## 2021-01-26 IMAGING — DX DG FOOT 2V*L*
2 series · 2 of 2 positions shown · non-contrast
Comparison: [DATE]

CLINICAL DATA: Toe infection.

EXAM:
LEFT FOOT - 2 VIEW

[x foot ap left]
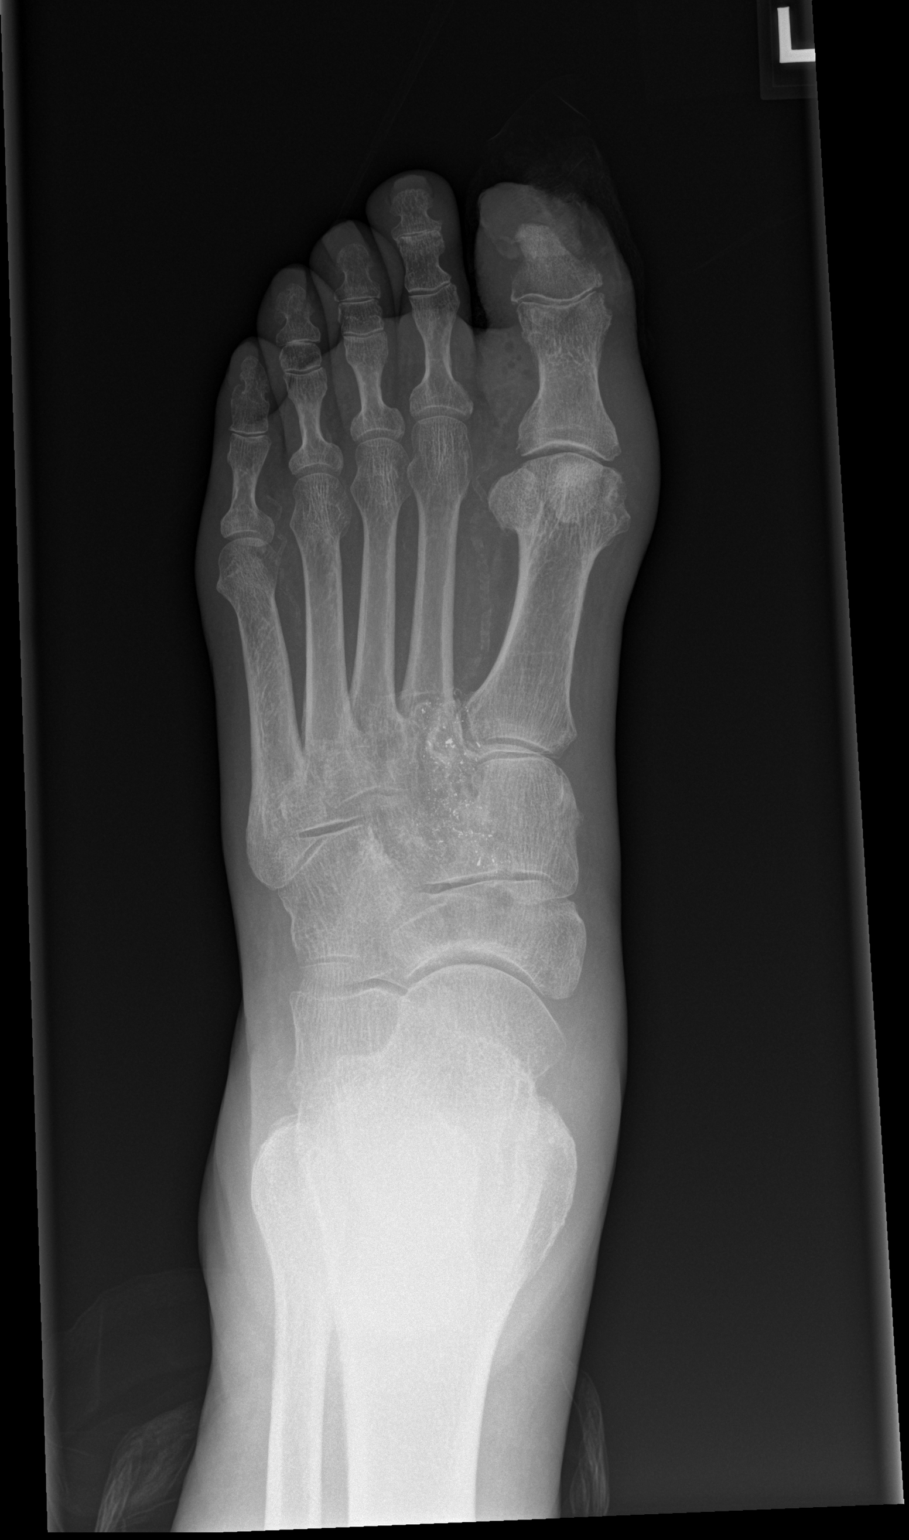

[x foot lat left]
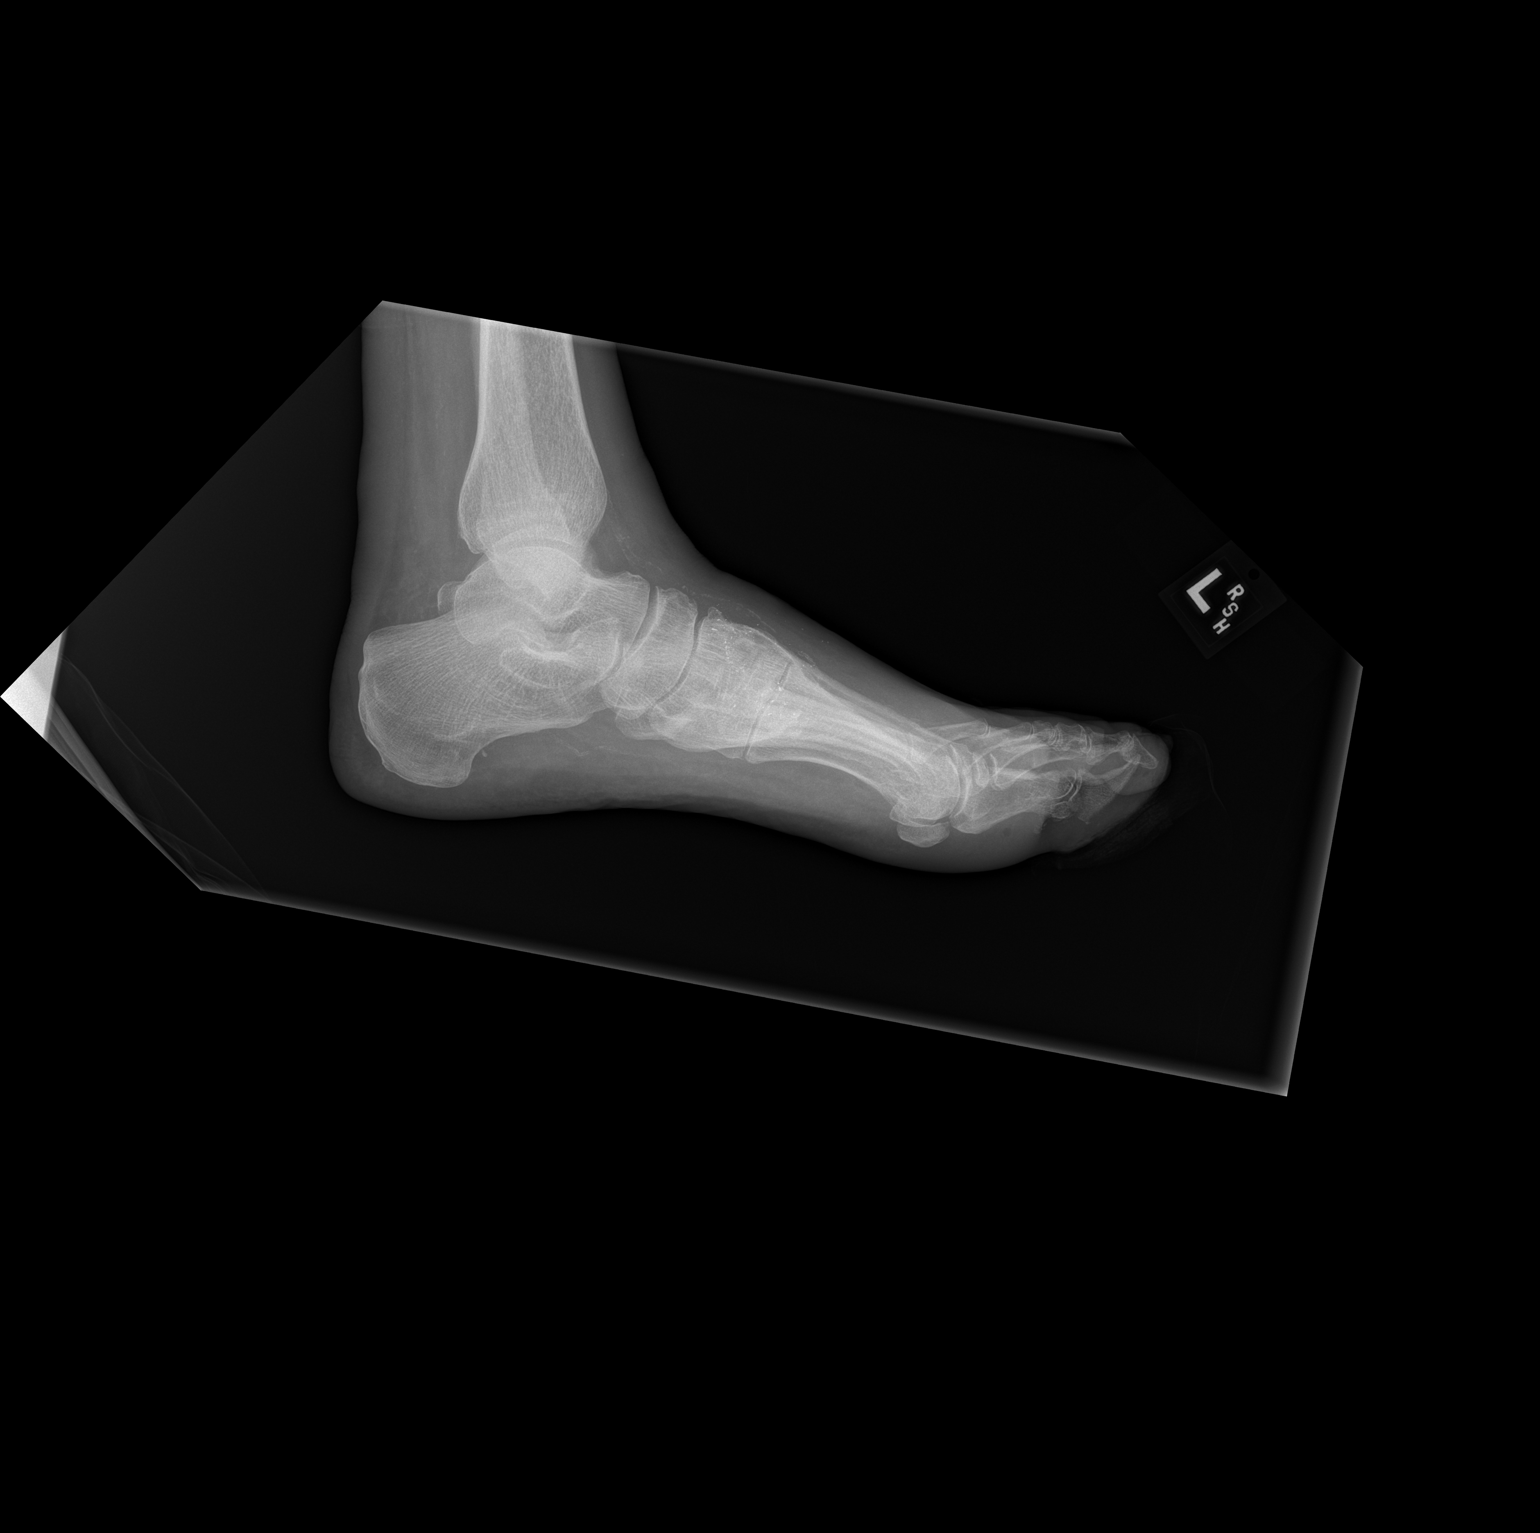

[2 of 2 positions shown; findings below may reference images not displayed]

FINDINGS: Soft tissue wound identified medial aspect of the great toe. Since
[DATE], the tuft of the distal phalanx of the great toe has
eroded, suggesting osteomyelitis. There is cortical disruption at
the medial base of the great toe distal phalanx. Gas is identified
in the soft tissues between the first and second toes. Degenerative
changes noted first MTP joint.
IMPRESSION: Osteomyelitis of the distal phalanx left great toe with associated
soft tissue wound and gas in the soft tissues between the first and
second toes.

## 2021-01-26 MED ORDER — ATORVASTATIN CALCIUM 10 MG PO TABS
10.0000 mg | ORAL_TABLET | Freq: Every day | ORAL | Status: DC
  Administered 2021-01-26 – 2021-01-28 (×3): 10 mg via ORAL
  Filled 2021-01-26 (×3): qty 1

## 2021-01-26 MED ORDER — INSULIN GLARGINE-YFGN 100 UNIT/ML ~~LOC~~ SOLN
15.0000 [IU] | Freq: Every day | SUBCUTANEOUS | Status: DC
  Filled 2021-01-26 (×2): qty 0.15

## 2021-01-26 MED ORDER — OXYCODONE HCL 5 MG PO TABS
5.0000 mg | ORAL_TABLET | Freq: Two times a day (BID) | ORAL | Status: DC | PRN
  Administered 2021-01-26 – 2021-01-28 (×4): 5 mg via ORAL
  Filled 2021-01-26 (×4): qty 1

## 2021-01-26 MED ORDER — POLYETHYLENE GLYCOL 3350 17 G PO PACK
17.0000 g | PACK | Freq: Every day | ORAL | Status: DC | PRN
  Administered 2021-01-28: 17 g via ORAL

## 2021-01-26 MED ORDER — AMLODIPINE BESYLATE 10 MG PO TABS
10.0000 mg | ORAL_TABLET | Freq: Every day | ORAL | Status: DC
  Administered 2021-01-26 – 2021-01-28 (×3): 10 mg via ORAL
  Filled 2021-01-26 (×4): qty 1

## 2021-01-26 MED ORDER — ACETAMINOPHEN 325 MG PO TABS: 650.0000 mg | ORAL_TABLET | Freq: Four times a day (QID) | ORAL | Status: DC | PRN

## 2021-01-26 MED ORDER — ACETAMINOPHEN 650 MG RE SUPP: 650.0000 mg | Freq: Four times a day (QID) | RECTAL | Status: DC | PRN

## 2021-01-26 MED ORDER — BASAGLAR KWIKPEN 100 UNIT/ML ~~LOC~~ SOPN: 15.0000 [IU] | PEN_INJECTOR | Freq: Every day | SUBCUTANEOUS | Status: DC

## 2021-01-26 MED ORDER — ONDANSETRON HCL 4 MG PO TABS: 4.0000 mg | ORAL_TABLET | Freq: Four times a day (QID) | ORAL | Status: DC | PRN

## 2021-01-26 MED ORDER — INSULIN ASPART 100 UNIT/ML IJ SOLN
0.0000 [IU] | Freq: Three times a day (TID) | INTRAMUSCULAR | Status: DC
  Administered 2021-01-27: 1 [IU] via SUBCUTANEOUS
  Administered 2021-01-27: 2 [IU] via SUBCUTANEOUS
  Administered 2021-01-28: 1 [IU] via SUBCUTANEOUS
  Administered 2021-01-28: 3 [IU] via SUBCUTANEOUS

## 2021-01-26 MED ORDER — OXYCODONE HCL 5 MG PO TABS: 5.0000 mg | ORAL_TABLET | ORAL | Status: DC | PRN

## 2021-01-26 MED ORDER — OXYCODONE-ACETAMINOPHEN 5-325 MG PO TABS
1.0000 | ORAL_TABLET | ORAL | Status: AC | PRN
  Administered 2021-01-26 (×2): 1 via ORAL
  Filled 2021-01-26 (×2): qty 1

## 2021-01-26 MED ORDER — SODIUM CHLORIDE 0.9 % IV SOLN
2.0000 g | INTRAVENOUS | Status: DC
  Administered 2021-01-26: 2 g via INTRAVENOUS
  Filled 2021-01-26: qty 20

## 2021-01-26 MED ORDER — HYDROCHLOROTHIAZIDE 12.5 MG PO CAPS
12.5000 mg | ORAL_CAPSULE | Freq: Every day | ORAL | Status: DC
  Administered 2021-01-27 – 2021-01-28 (×2): 12.5 mg via ORAL
  Filled 2021-01-26 (×2): qty 1

## 2021-01-26 MED ORDER — ASPIRIN EC 81 MG PO TBEC
81.0000 mg | DELAYED_RELEASE_TABLET | Freq: Every day | ORAL | Status: DC
  Administered 2021-01-28: 81 mg via ORAL
  Filled 2021-01-26: qty 1

## 2021-01-26 MED ORDER — BISACODYL 5 MG PO TBEC: 5.0000 mg | DELAYED_RELEASE_TABLET | Freq: Every day | ORAL | Status: DC | PRN

## 2021-01-26 MED ORDER — KCL IN DEXTROSE-NACL 10-5-0.45 MEQ/L-%-% IV SOLN
INTRAVENOUS | Status: DC
Start: 2021-01-26 — End: 2021-01-28
  Filled 2021-01-26 (×4): qty 1000

## 2021-01-26 MED ORDER — HEPARIN SODIUM (PORCINE) 5000 UNIT/ML IJ SOLN
5000.0000 [IU] | Freq: Two times a day (BID) | INTRAMUSCULAR | Status: DC
  Administered 2021-01-26 – 2021-01-27 (×2): 5000 [IU] via SUBCUTANEOUS
  Filled 2021-01-26 (×2): qty 1

## 2021-01-26 MED ORDER — ONDANSETRON HCL 4 MG/2ML IJ SOLN: 4.0000 mg | Freq: Four times a day (QID) | INTRAMUSCULAR | Status: DC | PRN

## 2021-01-26 NOTE — ED Notes (Signed)
Patient transported to Ultrasound 

## 2021-01-26 NOTE — H&P (Signed)
History and Physical    Joseph [name] IRJ:[id number] DOB: [birth year] DOA: 01/26/2021  PCP: Charlott Rakes, MD (Confirm with patient/family/NH records and if not entered, this has to be entered at [facility] point of entry) Patient coming from: Home  I have personally briefly reviewed patient's old medical records in [place]  Chief Complaint: Unhealing wound left toe  HPI: Joseph Guiana Kropp is a 75 y.o. male with medical history significant of IDDM, HTN, CKD stage II, chronic left foot wound, sent from vascular surgery with concern about worsening of left big toe infection.  Patient has had increasing wound of left big toe for about 13-month received several rounds of p.o. antibiotics including most recent doxycycline started 2 weeks ago.  And he has been following with vascular surgery for wound care.  He complains about persistent uncontrolled pain on the big toe and unhealing wound.  He reports no significant increase of discharge from the left toe, no fever or chills.  ED Course: Vital signs stable, no hypotension or tachycardia WBC 17.2  Review of Systems: As per HPI otherwise 14 point review of systems negative.    Past Medical History:  Diagnosis Date   Cellulitis of right lower extremity    Diabetes mellitus type 2 in nonobese ([facility]    Gangrene of right foot ([facility]    Hypertension    Open wound of right foot     Past Surgical History:  Procedure Laterality Date   AMPUTATION Right 11/16/2018   Procedure: RIGHT BELOW KNEE AMPUTATION;  Surgeon: DNewt Minion MD;  [place]  Service: Orthopedics;  Laterality: Right;   HAND SURGERY       reports that he has never smoked. He has never used smokeless tobacco. He reports current alcohol use. He reports that he does not use drugs.  No Known Allergies  Family History  Problem Relation Age of Onset   Diabetes Father    Cancer Neg Hx    CAD Neg Hx      Prior to Admission medications   Medication Sig Start Date End Date  Taking? Authorizing Provider  Accu-Chek FastClix Lancets MISC Use to check blood sugars three times per day 05/17/19   Fulp, Cammie, MD  amLODipine (NORVASC) 10 MG tablet Take 1 tablet (10 mg total) by mouth daily. 12/02/20   MArgentina Donovan PA-C  aspirin EC 81 MG tablet Take 81 mg by mouth daily. Swallow whole.    [provider]  atorvastatin (LIPITOR) 10 MG tablet Take 1 tablet (10 mg total) by mouth daily. In the evening 12/02/20   MArgentina Donovan PA-C  Blood Glucose Monitoring Suppl (ACCU-CHEK GUIDE ME) w/Device KIT 1 kit by Does not apply route 3 (three) times daily. To check blood sugars 05/17/19   Fulp, Cammie, MD  cadexomer iodine (IODOSORB) 0.9 % gel Apply 1 application topically 3 (three) times a week. Cleanse wound with wound wash solution.  Apply small dab of iodorsorb and cover with a dry sterile dressing daily 01/15/21   MCriselda Peaches DPM  diclofenac sodium (VOLTAREN) 1 % GEL Apply 2 g topically 3 (three) times daily. 12/03/18   Angiulli, DLavon Paganini PA-C  doxycycline (VIBRA-TABS) 100 MG tablet Take 1 tablet (100 mg total) by mouth 2 (two) times daily. 01/11/21   McDonald, AStephan Minister DPM  glipiZIDE (GLUCOTROL) 10 MG tablet TAKE 1 TABLET (10 MG TOTAL) BY MOUTH 2 (TWO) TIMES DAILY BEFORE A MEAL. 12/02/20 12/02/21  MArgentina Donovan PA-C  glucose  blood (ACCU-CHEK GUIDE) test strip Use as instructed 05/17/19   Fulp, Cammie, MD  hydrochlorothiazide (MICROZIDE) 12.5 MG capsule Take 1 capsule (12.5 mg total) by mouth daily. 12/02/20   Argentina Donovan, PA-C  Insulin Glargine (BASAGLAR KWIKPEN) 100 UNIT/ML Inject 15 Units into the skin daily. 12/30/20   Argentina Donovan, PA-C  Insulin Pen Needle 31G X 5 MM MISC use one pen needle once daily. 12/30/20   Argentina Donovan, PA-C  Lancets Misc. (ACCU-CHEK FASTCLIX LANCET) KIT Use when checking blood sugars three times daily 05/17/19   Fulp, Cammie, MD  Multiple Vitamins-Minerals (ONE-A-DAY MENS 50+ ADVANTAGE) TABS Take 1 tablet by mouth daily with  breakfast.    [provider]  mupirocin ointment (BACTROBAN) 2 % Apply 1 application topically 2 (two) times daily. 12/02/20   Argentina Donovan, PA-C  oxyCODONE (OXY IR/ROXICODONE) 5 MG immediate release tablet Take 1 tablet (5 mg total) by mouth 2 (two) times daily as needed for severe pain (pain score 7-10). 03/19/19   Suzan Slick, NP  polyethylene glycol (MIRALAX / GLYCOLAX) 17 g packet Take 17 g by mouth daily as needed for mild constipation. 11/20/18   Georgette Shell, MD    Physical Exam: Vitals:   01/26/21 0756 01/26/21 1110 01/26/21 1401 01/26/21 1724  BP: (!) 147/80 130/75 132/83 138/82  Pulse: 82 71 72 80  Resp: _0 Temp: 99 F (37.2 C) 98.1 F (36.7 C)  98.1 F (36.7 C)  TempSrc: Oral Oral  Oral  SpO2: 100% 100% 100% 98%    Constitutional: NAD, calm, comfortable Vitals:   01/26/21 0756 01/26/21 1110 01/26/21 1401 01/26/21 1724  BP: (!) 147/80 130/75 132/83 138/82  Pulse: 82 71 72 80  Resp: _1 Temp: 99 F (37.2 C) 98.1 F (36.7 C)  98.1 F (36.7 C)  TempSrc: Oral Oral  Oral  SpO2: 100% 100% 100% 98%   Eyes: PERRL, lids and conjunctivae normal ENMT: Mucous membranes are moist. Posterior pharynx clear of any exudate or lesions.Normal dentition.  Neck: normal, supple, no masses, no thyromegaly Respiratory: clear to auscultation bilaterally, no wheezing, no crackles. Normal respiratory effort. No accessory muscle use.  Cardiovascular: Regular rate and rhythm, no murmurs / rubs / gallops. No extremity edema. 2+ pedal pulses. No carotid bruits.  Abdomen: no tenderness, no masses palpated. No hepatosplenomegaly. Bowel sounds positive.  Musculoskeletal: no clubbing / cyanosis. No joint deformity upper and lower extremities. Good ROM, no contractures. Normal muscle tone.  Skin: Left big toe wound extending to the distal MTP, with clear color thin discharge Neurologic: CN 2-12 grossly intact. Sensation intact, DTR normal. Strength 5/5 in all  4.  Psychiatric: Normal judgment and insight. Alert and oriented x 3. Normal mood.      Labs on Admission: I have personally reviewed following labs and imaging studies  CBC: Recent Labs  Lab 01/26/21 0826  WBC 17.2*  NEUTROABS 10.9*  HGB 10.1*  HCT 31.8*  MCV 94.6  PLT 885   Basic Metabolic Panel: Recent Labs  Lab 01/26/21 0826  NA 139  K 4.6  CL 108  CO2 23  GLUCOSE 121*  BUN 26*  CREATININE 1.54*  CALCIUM 9.4   GFR: CrCl cannot be calculated (Unknown ideal weight.). Liver Function Tests: Recent Labs  Lab 01/26/21 0826  AST 18  ALT 14  ALKPHOS 55  BILITOT 0.7  PROT 7.3  ALBUMIN 3.0*   No results for input(s): LIPASE, AMYLASE in  the last 168 hours. No results for input(s): AMMONIA in the last 168 hours. Coagulation Profile: No results for input(s): INR, PROTIME in the last 168 hours. Cardiac Enzymes: No results for input(s): CKTOTAL, CKMB, CKMBINDEX, TROPONINI in the last 168 hours. BNP (last 3 results) No results for input(s): PROBNP in the last 8760 hours. HbA1C: No results for input(s): HGBA1C in the last 72 hours. CBG: Recent Labs  Lab 01/26/21 0758  GLUCAP 122*   Lipid Profile: No results for input(s): CHOL, HDL, LDLCALC, TRIG, CHOLHDL, LDLDIRECT in the last 72 hours. Thyroid Function Tests: No results for input(s): TSH, T4TOTAL, FREET4, T3FREE, THYROIDAB in the last 72 hours. Anemia Panel: No results for input(s): VITAMINB12, FOLATE, FERRITIN, TIBC, IRON, RETICCTPCT in the last 72 hours. Urine analysis:    Component Value Date/Time   COLORURINE YELLOW 11/14/2018 0324   APPEARANCEUR HAZY (A) 11/14/2018 0324   LABSPEC 1.015 11/14/2018 0324   PHURINE 5.0 11/14/2018 0324   GLUCOSEU NEGATIVE 11/14/2018 0324   HGBUR NEGATIVE 11/14/2018 0324   BILIRUBINUR NEGATIVE 11/14/2018 0324   KETONESUR NEGATIVE 11/14/2018 0324   PROTEINUR NEGATIVE 11/14/2018 0324   UROBILINOGEN 0.2 12/01/2010 1914   NITRITE NEGATIVE 11/14/2018 0324   LEUKOCYTESUR  NEGATIVE 11/14/2018 0324    Radiological Exams on Admission: DG Chest 2 View  Result Date: 01/26/2021 CLINICAL DATA:  right toe infection EXAM: CHEST - 2 VIEW COMPARISON:  None. FINDINGS: The cardiomediastinal silhouette is within normal limits. No pleural effusion. No pneumothorax. No mass or consolidation. No acute osseous abnormality. IMPRESSION: No active cardiopulmonary disease. Electronically Signed   By: Albin Felling M.D.   On: 01/26/2021 08:56   DG Foot 2 Views Left  Result Date: 01/26/2021 CLINICAL DATA:  Toe infection. EXAM: LEFT FOOT - 2 VIEW COMPARISON:  12/08/2020 FINDINGS: Soft tissue wound identified medial aspect of the great toe. Since 12/08/2020, the tuft of the distal phalanx of the great toe has eroded, suggesting osteomyelitis. There is cortical disruption at the medial base of the great toe distal phalanx. Gas is identified in the soft tissues between the first and second toes. Degenerative changes noted first MTP joint. 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 Electronically Signed   By: Misty Stanley M.D.   On: 01/26/2021 08:55   VAS [place] ABI WITH/WO TBI  Result Date: 01/26/2021  LOWER EXTREMITY DOPPLER STUDY Patient Name:  Joseph Guiana Schult  Date of Exam:   01/26/2021 Medical Rec #: [id number]    Accession #:    [id number] Date of Birth: [birth year]    Patient Gender: M Patient Age:   74 years Exam Location:  [facility] Procedure:      VAS [place] ABI WITH/WO TBI Referring Phys: Domenic Moras --------------------------------------------------------------------------------  Indications: Ulceration, and peripheral artery disease. High Risk Factors: Hypertension, Diabetes. Other Factors: Right BKA.  Limitations: Today's exam was limited due to an open wound. Comparison Study: 11/14/2018 ABI- right noncompressible, dampened monophasic                   waveforms, left  falsely elevated with dampened monophasic                   waveforms. Performing Technologist: Maudry Mayhew MHA, RVT, RDCS, RDMS  Examination Guidelines: A complete evaluation includes at minimum, Doppler waveform signals and systolic blood pressure reading at the level of bilateral brachial, anterior tibial, and posterior  tibial arteries, when vessel segments are accessible. Bilateral testing is considered an integral part of a complete examination. Photoelectric Plethysmograph (PPG) waveforms and toe systolic pressure readings are included as required and additional duplex testing as needed. Limited examinations for reoccurring indications may be performed as noted.  ABI Findings: +--------+------------------+-----+---------+--------+ Right   Rt Pressure (mmHg)IndexWaveform Comment  +--------+------------------+-----+---------+--------+ [id number]                    triphasic         +--------+------------------+-----+---------+--------+ PTA                                     BKA      +--------+------------------+-----+---------+--------+ DP                                      BKA      +--------+------------------+-----+---------+--------+ +--------+------------------+-----+-------------------+-------+ Left    Lt Pressure (mmHg)IndexWaveform           Comment +--------+------------------+-----+-------------------+-------+ [id number]                    triphasic                  +--------+------------------+-----+-------------------+-------+ PTA                            absent                     +--------+------------------+-----+-------------------+-------+ DP      32                0.20 dampened monophasic        +--------+------------------+-----+-------------------+-------+ +-------+-----------+-----------+------------+------------+ ABI/TBIToday's ABIToday's TBIPrevious ABIPrevious TBI  +-------+-----------+-----------+------------+------------+ Right  BKA        BKA                                 +-------+-----------+-----------+------------+------------+ Left   0.20       Wound                               +-------+-----------+-----------+------------+------------+  Summary:  *See table(s) above for measurements and observations.  Electronically signed by Deitra Mayo MD on 01/26/2021 at 5:44:59 PM.    Final     EKG: Ordered  Assessment/Plan Active Problems:   Osteomyelitis of great toe of left foot ([place])   Osteomyelitis ([place])  (please populate well all problems here in Problem List. (For example, if patient is on BP meds at home and you resume or decide to hold them, it is a problem that needs to be her. Same for CAD, COPD, HLD and so on)  Left big toe osteomyelitis -Worsening of infection, failed outpatient antibiotic treatment. -Wound culture on 8/15 showed Klebsiella and Proteus, both sensitive to ceftriaxone, ordered ceftriaxone 2 g daily. -Strongly suspect underlying PVD.  Vascular surgery and podiatry on board.  CKD stage II -Vascular surgeon plans for arteriogram tomorrow -Agree with gentle hydration for kidney protection of D5 half-normal saline at 75 mill per hour.  IDDM -Lantus plus sliding scale.  HTN -Amlodipine  HLD -Statin  DVT prophylaxis: Heparin subcu Code Status: Full code Family Communication: None  at bedside Disposition Plan: Expect more than 2 midnight hospital stay to allow vascular and podiatry procedures. Consults called: Vascular surgery and podiatry Admission status: MedSurg admission   Lequita Halt MD [facility] Pager [phone]  01/26/2021, 6:22 PM

## 2021-01-26 NOTE — Progress Notes (Signed)
Subjective:  Patient ID: Joseph [name], male    DOB: 07/12/1945,  MRN: [id number]  Chief Complaint  Patient presents with   Diabetic Ulcer     wound care, surgery planning, follow up vascular testing    75 y.o. male returns for follow-up with the above complaint. History confirmed with patient.  Was late to his appointment today but I did see him.  He still has not completed the vascular testing  Objective:  Physical Exam: warm, good capillary refill.  His pulses are nonpalpable.  Hemosiderosis throughout the lower extremities.  Diffuse neuropathy.  He has a large plantar ulceration much worse on last visit now extended and with exposed bone, there is pallor around the toe and severe cellulitis to the midfoot with swelling and malodorous drainage.     Radiographs: Multiple views x-ray of the left foot: no soft tissue emphysema, no signs of osteomyelitis  Study Result  Narrative & Impression  CLINICAL DATA:  Foot swelling, diabetic, osteomyelitis suspected, xray done   EXAM: MRI OF THE LEFT TOES WITHOUT AND WITH CONTRAST   TECHNIQUE: Multiplanar, multisequence MR imaging of the left forefoot was performed both before and after administration of intravenous contrast.   CONTRAST:  40mL MULTIHANCE GADOBENATE DIMEGLUMINE 529 MG/ML IV SOLN   COMPARISON:  X-ray 12/08/2020   FINDINGS: Bones/Joint/Cartilage   Soft tissue ulceration at the plantar aspect of the great toe distal phalanx with marked bone marrow edema throughout the distal phalanx and associated confluent low T1 marrow signal compatible with acute osteomyelitis. There is bone marrow edema within the distal 1.5 cm of the great toe proximal phalanx extending to the interphalangeal joint with subtle intermediate T1 marrow signal suggestive of reactive osteitis or early acute osteomyelitis.   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 There is also ankylosis of the medial and intermediate cuneiform bones.  Patchy edema-like bone marrow signal within the midfoot, which is likely degenerative/reactive.   Mild degenerative changes involving the midfoot and first MTP joint. No acute fracture or dislocation.   Ligaments   Collateral ligaments of the distal forefoot appear grossly intact.   Muscles and Tendons   Denervation changes of the intrinsic foot musculature. No tenosynovial fluid collections.   Soft tissues   Plantar soft tissue ulceration underlies the great toe distal phalanx. Diffuse soft tissue edema throughout the distal forefoot. No organized fluid collection.   IMPRESSION: 1. Acute osteomyelitis of the left great toe distal phalanx. 2. Findings suggestive of reactive osteitis or early acute osteomyelitis involving the proximal phalanx of the great toe. 3. Soft tissue ulceration along the plantar aspect of the great toe. Diffuse soft tissue edema throughout the distal forefoot, suggesting cellulitis. No organized fluid collection. 4. 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     Electronically Signed   By: Duanne Guess D.O.   On: 01/09/2021 15:21    Assessment:   1. Diabetic foot infection (HCC)   2. Diabetic ulcer of toe of left foot associated with diabetes mellitus due to underlying condition, with necrosis of muscle (HCC)   3. PVD (peripheral vascular disease) (HCC)        Plan:  Patient was evaluated and treated and all questions answered.  Ulcer left hallux -Unfortunately worsened quite a bit and I think he is at high risk of losing more than the big toe and partially [place] of the foot.  Suspect he has a severe diabetic foot infection possibly necrotizing and advancing of  the foot.  I recommended he go to the emergency room for admission for IV antibiotics MRI and surgical intervention and vascular evaluation and intervention as well.  He agreed that he would do this in the morning.  I  discussed the risk of waiting further and that he could possibly continue to lose more of the limb.  He understands the risk of this and would like to continue.      Return for after hospital .

## 2021-01-26 NOTE — Consult Note (Signed)
ORTHOPAEDIC CONSULTATION  REQUESTING PHYSICIAN: Truddie Hidden, MD  Chief Complaint: Gangrenous ulcer left great toe.  HPI: Joseph [name] is a 75 y.o. male who presents with peripheral vascular disease status post right transtibial amputation 2 years ago who has been going to podiatry for about a month for wound care for an ischemic ulcer left great toe.  Past Medical History:  Diagnosis Date   Cellulitis of right lower extremity    Diabetes mellitus type 2 in nonobese [facility])    Gangrene of right foot ([place])    Hypertension    Open wound of right foot    Past Surgical History:  Procedure Laterality Date   AMPUTATION Right 11/16/2018   Procedure: RIGHT BELOW KNEE AMPUTATION;  Surgeon: Newt Minion, MD;  Location: [place];  Service: Orthopedics;  Laterality: Right;   HAND SURGERY     Social History   Socioeconomic History   Marital status: Divorced    Spouse name: Not on file   Number of children: Not on file   Years of education: Not on file   Highest education level: Not on file  Occupational History   Occupation: retired     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  Tobacco Use   Smoking status: Never   Smokeless tobacco: Never  Substance and Sexual Activity   Alcohol use: Yes   Drug use: No   Sexual activity: Not on file  Other Topics Concern   Not on file  Social History Narrative   Lives alone, retired a 8 wheeler/tank truck driver   On fixed Social security income   Has a daughter and son does not speak with them every day   Most reliable family members reported to live in [place]   Divorced but remains friends with his ex wife, Lucina Mellow   Has advance directives but has not as 11/03/20 [facility] a copy [facility]   Goes to church      Social Determinants of Health   Financial Resource Strain: Medium Risk   Difficulty of Paying Living Expenses: Somewhat hard  Food Insecurity: Landscape architect Present   Worried About Charity fundraiser in the  Last Year: Sometimes true   Arboriculturist in the Last Year: Sometimes true  Transportation Needs: No Transportation Needs   Lack of Transportation (Medical): No   Lack of Transportation (Non-Medical): No  Physical Activity: Not on file  Stress: No Stress Concern Present   Feeling of Stress : Only a little  Social Connections: Moderately Isolated   Frequency of Communication with Friends and Family: Once a week   Frequency of Social Gatherings with Friends and Family: Once a week   Attends Religious Services: 1 to 4 times per year   Active Member of [employer] or Organizations: Yes   Attends Archivist Meetings: 1 to 4 times per year   Marital Status: Never married   Family History  Problem Relation Age of Onset   Diabetes Father    Cancer Neg Hx    CAD Neg Hx    - negative except otherwise stated in the family history section No Known Allergies Prior to Admission medications   Medication Sig Start Date End Date Taking? Authorizing Provider  Accu-Chek FastClix Lancets MISC Use to check blood sugars three times per day 05/17/19   Fulp, Cammie, MD  amLODipine (NORVASC) 10 MG tablet Take 1 tablet (10 mg total) by mouth daily. 12/02/20   McClung,  Dionne Bucy, PA-C  aspirin EC 81 MG tablet Take 81 mg by mouth daily. Swallow whole.    [provider]  atorvastatin (LIPITOR) 10 MG tablet Take 1 tablet (10 mg total) by mouth daily. In the evening 12/02/20   Argentina Donovan, PA-C  Blood Glucose Monitoring Suppl (ACCU-CHEK GUIDE ME) w/Device KIT 1 kit by Does not apply route 3 (three) times daily. To check blood sugars 05/17/19   Fulp, Cammie, MD  cadexomer iodine (IODOSORB) 0.9 % gel Apply 1 application topically 3 (three) times a week. Cleanse wound with wound wash solution.  Apply small dab of iodorsorb and cover with a dry sterile dressing daily 01/15/21   Criselda Peaches, DPM  diclofenac sodium (VOLTAREN) 1 % GEL Apply 2 g topically 3 (three) times daily. 12/03/18   Angiulli,  Lavon Paganini, PA-C  doxycycline (VIBRA-TABS) 100 MG tablet Take 1 tablet (100 mg total) by mouth 2 (two) times daily. 01/11/21   McDonald, Adam R, DPM  glipiZIDE (GLUCOTROL) 10 MG tablet TAKE 1 TABLET (10 MG TOTAL) BY MOUTH 2 (TWO) TIMES DAILY BEFORE A MEAL. 12/02/20 12/02/21  Argentina Donovan, PA-C  glucose blood (ACCU-CHEK GUIDE) test strip Use as instructed 05/17/19   Fulp, Cammie, MD  hydrochlorothiazide (MICROZIDE) 12.5 MG capsule Take 1 capsule (12.5 mg total) by mouth daily. 12/02/20   Argentina Donovan, PA-C  Insulin Glargine (BASAGLAR KWIKPEN) 100 UNIT/ML Inject 15 Units into the skin daily. 12/30/20   Argentina Donovan, PA-C  Insulin Pen Needle 31G X 5 MM MISC use one pen needle once daily. 12/30/20   Argentina Donovan, PA-C  Lancets Misc. (ACCU-CHEK FASTCLIX LANCET) KIT Use when checking blood sugars three times daily 05/17/19   Fulp, Cammie, MD  Multiple Vitamins-Minerals (ONE-A-DAY MENS 50+ ADVANTAGE) TABS Take 1 tablet by mouth daily with breakfast.    [provider]  mupirocin ointment (BACTROBAN) 2 % Apply 1 application topically 2 (two) times daily. 12/02/20   Argentina Donovan, PA-C  oxyCODONE (OXY IR/ROXICODONE) 5 MG immediate release tablet Take 1 tablet (5 mg total) by mouth 2 (two) times daily as needed for severe pain (pain score 7-10). 03/19/19   Suzan Slick, NP  polyethylene glycol (MIRALAX / GLYCOLAX) 17 g packet Take 17 g by mouth daily as needed for mild constipation. 11/20/18   Georgette Shell, MD   DG Chest 2 View  Result Date: 01/26/2021 CLINICAL DATA:  right toe infection EXAM: CHEST - 2 VIEW COMPARISON:  None. FINDINGS: The cardiomediastinal silhouette is within normal limits. No pleural effusion. No pneumothorax. No mass or consolidation. No acute osseous abnormality. IMPRESSION: No active cardiopulmonary disease. Electronically Signed   By: Albin Felling M.D.   On: 01/26/2021 08:56   DG Foot 2 Views Left  Result Date: 01/26/2021 CLINICAL DATA:  Toe infection.  EXAM: LEFT FOOT - 2 VIEW COMPARISON:  12/08/2020 FINDINGS: Soft tissue wound identified medial aspect of the great toe. Since 12/08/2020, the tuft of the distal phalanx of the great toe has eroded, suggesting osteomyelitis. There is cortical disruption at the medial base of the great toe distal phalanx. Gas is identified in the soft tissues between the first and second toes. Degenerative changes noted first MTP joint. 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 Electronically Signed   By: Misty Stanley M.D.   On: 01/26/2021 08:55   VAS [place]  ABI WITH/WO TBI  Result Date: 01/26/2021  LOWER EXTREMITY DOPPLER STUDY Patient Name:  Joseph Guiana Grandberry  Date of Exam:   01/26/2021 Medical Rec #: [id number]    Accession #:    [id number] Date of Birth: 30-May-1946    Patient Gender: M Patient Age:   29 years Exam Location:  [facility] Procedure:      VAS [place] ABI WITH/WO TBI Referring Phys: Domenic Moras --------------------------------------------------------------------------------  Indications: Ulceration, and peripheral artery disease. High Risk Factors: Hypertension, Diabetes. Other Factors: Right BKA.  Limitations: Today's exam was limited due to an open wound. Comparison Study: 11/14/2018 ABI- right noncompressible, dampened monophasic                   waveforms, left falsely elevated with dampened monophasic                   waveforms. Performing Technologist: Maudry Mayhew MHA, RVT, RDCS, RDMS  Examination Guidelines: A complete evaluation includes at minimum, Doppler waveform signals and systolic blood pressure reading at the level of bilateral brachial, anterior tibial, and posterior tibial arteries, when vessel segments are accessible. Bilateral testing is considered an integral part of a complete examination. Photoelectric Plethysmograph (PPG) waveforms and toe systolic pressure readings are  included as required and additional duplex testing as needed. Limited examinations for reoccurring indications may be performed as noted.  ABI Findings: +--------+------------------+-----+---------+--------+ Right   Rt Pressure (mmHg)IndexWaveform Comment  +--------+------------------+-----+---------+--------+ [id number]                    triphasic         +--------+------------------+-----+---------+--------+ PTA                                     BKA      +--------+------------------+-----+---------+--------+ DP                                      BKA      +--------+------------------+-----+---------+--------+ +--------+------------------+-----+-------------------+-------+ Left    Lt Pressure (mmHg)IndexWaveform           Comment +--------+------------------+-----+-------------------+-------+ [id number]                    triphasic                  +--------+------------------+-----+-------------------+-------+ PTA                            absent                     +--------+------------------+-----+-------------------+-------+ DP      32                0.20 dampened monophasic        +--------+------------------+-----+-------------------+-------+ +-------+-----------+-----------+------------+------------+ ABI/TBIToday's ABIToday's TBIPrevious ABIPrevious TBI +-------+-----------+-----------+------------+------------+ Right  BKA        BKA                                 +-------+-----------+-----------+------------+------------+ Left   0.20       Wound                               +-------+-----------+-----------+------------+------------+  Summary:  *See table(s) above for measurements and observations.  Electronically signed by Deitra Mayo MD on 01/26/2021 at 5:44:59 PM.    Final    - pertinent xrays, CT, MRI studies were reviewed and independently interpreted  Positive ROS: All other systems have been reviewed and were  otherwise negative with the exception of those mentioned in the HPI and as above.  Physical Exam: General: Alert, no acute distress Psychiatric: Patient is competent for consent with normal mood and affect Lymphatic: No axillary or cervical lymphadenopathy Cardiovascular: No pedal edema Respiratory: No cyanosis, no use of accessory musculature GI: No organomegaly, abdomen is soft and non-tender    Images:  _0 @  Labs:  Lab Results  Component Value Date   HGBA1C 11.7 (A) 12/30/2020   HGBA1C 9.8 (A) 02/06/2020   HGBA1C 11.3 (H) 05/06/2019   REPTSTATUS 11/18/2018 FINAL 11/13/2018   CULT  11/13/2018    NO GROWTH 5 DAYS Performed at [facility], [place] [address] [place], [place] [address]     Lab Results  Component Value Date   ALBUMIN 3.0 (L) 01/26/2021   ALBUMIN 4.1 12/30/2020   ALBUMIN 4.3 02/06/2020     CBC EXTENDED Latest Ref Rng & Units 01/26/2021 12/30/2020 02/06/2020  WBC 4.0 - 10.5 K/uL 17.2(H) 9.8 8.3  RBC 4.22 - 5.81 MIL/uL 3.36(L) 3.95(L) 3.91(L)  HGB 13.0 - 17.0 g/dL 10.1(L) 11.8(L) 11.9(L)  HCT 39.0 - 52.0 % 31.8(L) 35.6(L) 35.3(L)  PLT 150 - 400 K/uL 315 232 202  NEUTROABS 1.7 - 7.7 K/uL 10.9(H) 4.7 3.8  LYMPHSABS 0.7 - 4.0 K/uL 4.6(H) 4.1(H) 3.8(H)    Neurologic: Patient does not have protective sensation bilateral lower extremities.   MUSCULOSKELETAL:   Skin: Examination patient has ischemic ulcer of the left great toe plantar aspect radiographs show destructive bony changes consistent with chronic osteomyelitis.  Review of the MRI scan shows increased edema in the great toe also consistent with osteomyelitis.  Review of the ankle-brachial indices show significant decreased flow from his previous study 2 years ago.  Patient has an absent posterior tibial pulse and a dampened dorsalis pedis pulse with an ABI of 0.2.  Assessment: Assessment: Diabetic insensate neuropathy with necrotic ulcer and osteomyelitis of the left great toe with severe  peripheral vascular disease.  Plan: Vascular surgery has been consulted for arterial evaluation of the left lower extremity.  Patient will need improve circulation for limb salvage of the left foot.  I will follow as needed.  Thank you for the consult and the opportunity to see Joseph [name], Joseph [name] 6:02 PM

## 2021-01-26 NOTE — ED Provider Notes (Signed)
[facility] EMERGENCY DEPARTMENT Provider Note   CSN: [id number] Arrival date & time: 01/26/21  0746     History Chief Complaint  Patient presents with   Foot Wound    Joseph [name] is a 75 y.o. male.  The history is provided by the patient and medical records. No language interpreter was used.   75 year old male significant history of diabetes, recurrent skin infection, CKD, atrial fibrillation, diabetic peripheral neuropathy presented to the ED at the recommendation of podiatry for toe infection.  Patient report that he has been dealing with an infection to his left great toe ongoing for the past 2 months.  He follow-up with podiatry Dr. Sherryle Lis every 2 weeks for treatment.  He is currently on p.o. antibiotic.  He was last seen by his podiatrist yesterday for his wound.  It was a concern that he is lacking blood flow to his foot and he was recommended to come to ER for IV antibiotic, ABI, and further management.  He endorsed achy throbbing pain to the affected toe that has been persistent but progressively worse.  Pain is moderate in severity.  No fever.  No recent injury.  He is not up-to-date with COVID-vaccine.  His orthopedist is Dr. Sharol Given  Past Medical History:  Diagnosis Date   Cellulitis of right lower extremity    Diabetes mellitus type 2 in nonobese [facility])    Gangrene of right foot ([place])    Hypertension    Open wound of right foot     Patient Active Problem List   Diagnosis Date Noted   Postoperative pain    AKI (acute kidney injury) ([place])    Acute blood loss anemia    Chronic diastolic congestive heart failure (HCC)    Diabetic peripheral neuropathy (HCC)    CKD (chronic kidney disease), stage II    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)    Leukocytosis    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/2018   Atrial fibrillation (HCC)    Diabetic polyneuropathy associated with type 2 diabetes mellitus (HCC)    PVD (peripheral vascular  disease) ([place])    Critical lower limb ischemia ([place])    Sepsis with acute renal failure ([place]) 11/13/2018   Acute renal failure superimposed on stage 2 chronic kidney disease ([place])    'light-for-dates' infant with signs of fetal malnutrition 10/17/2018   DM (diabetes mellitus) ([place]) 07/02/2012   Hypertension associated with diabetes ([place]) 07/02/2012    Past Surgical History:  Procedure Laterality Date   AMPUTATION Right 11/16/2018   Procedure: RIGHT BELOW KNEE AMPUTATION;  Surgeon: Newt Minion, MD;  Location: [place];  Service: Orthopedics;  Laterality: Right;   HAND SURGERY         Family History  Problem Relation Age of Onset   Diabetes Father    Cancer Neg Hx    CAD Neg Hx     Social History   Tobacco Use   Smoking status: Never   Smokeless tobacco: Never  Substance Use Topics   Alcohol use: Yes   Drug use: No    Home Medications Prior to Admission medications   Medication Sig Start Date End Date Taking? Authorizing Provider  Accu-Chek FastClix Lancets MISC Use to check blood sugars three times per day 05/17/19   Fulp, Cammie, MD  amLODipine (NORVASC) 10 MG tablet Take 1 tablet (10 mg total) by mouth daily. 12/02/20   Argentina Donovan, PA-C  aspirin EC 81 MG tablet Take 81 mg by mouth  daily. Swallow whole.    [provider]  atorvastatin (LIPITOR) 10 MG tablet Take 1 tablet (10 mg total) by mouth daily. In the evening 12/02/20   Argentina Donovan, PA-C  Blood Glucose Monitoring Suppl (ACCU-CHEK GUIDE ME) w/Device KIT 1 kit by Does not apply route 3 (three) times daily. To check blood sugars 05/17/19   Fulp, Cammie, MD  cadexomer iodine (IODOSORB) 0.9 % gel Apply 1 application topically 3 (three) times a week. Cleanse wound with wound wash solution.  Apply small dab of iodorsorb and cover with a dry sterile dressing daily 01/15/21   Criselda Peaches, DPM  diclofenac sodium (VOLTAREN) 1 % GEL Apply 2 g topically 3 (three) times daily. 12/03/18   Angiulli, Lavon Paganini,  PA-C  doxycycline (VIBRA-TABS) 100 MG tablet Take 1 tablet (100 mg total) by mouth 2 (two) times daily. 01/11/21   McDonald, Adam R, DPM  glipiZIDE (GLUCOTROL) 10 MG tablet TAKE 1 TABLET (10 MG TOTAL) BY MOUTH 2 (TWO) TIMES DAILY BEFORE A MEAL. 12/02/20 12/02/21  Argentina Donovan, PA-C  glucose blood (ACCU-CHEK GUIDE) test strip Use as instructed 05/17/19   Fulp, Cammie, MD  hydrochlorothiazide (MICROZIDE) 12.5 MG capsule Take 1 capsule (12.5 mg total) by mouth daily. 12/02/20   Argentina Donovan, PA-C  Insulin Glargine (BASAGLAR KWIKPEN) 100 UNIT/ML Inject 15 Units into the skin daily. 12/30/20   Argentina Donovan, PA-C  Insulin Pen Needle 31G X 5 MM MISC use one pen needle once daily. 12/30/20   Argentina Donovan, PA-C  Lancets Misc. (ACCU-CHEK FASTCLIX LANCET) KIT Use when checking blood sugars three times daily 05/17/19   Fulp, Cammie, MD  Multiple Vitamins-Minerals (ONE-A-DAY MENS 50+ ADVANTAGE) TABS Take 1 tablet by mouth daily with breakfast.    [provider]  mupirocin ointment (BACTROBAN) 2 % Apply 1 application topically 2 (two) times daily. 12/02/20   Argentina Donovan, PA-C  oxyCODONE (OXY IR/ROXICODONE) 5 MG immediate release tablet Take 1 tablet (5 mg total) by mouth 2 (two) times daily as needed for severe pain (pain score 7-10). 03/19/19   Suzan Slick, NP  polyethylene glycol (MIRALAX / GLYCOLAX) 17 g packet Take 17 g by mouth daily as needed for mild constipation. 11/20/18   Georgette Shell, MD    Allergies    Patient has no known allergies.  Review of Systems   Review of Systems  All other systems reviewed and are negative.  Physical Exam Updated Vital Signs BP 132/83 (BP Location: Left Arm)   Pulse 72   Temp 98.1 F (36.7 C) (Oral)   Resp 18   SpO2 100%   Physical Exam Vitals and nursing note reviewed.  Constitutional:      General: He is not in acute distress.    Appearance: He is well-developed.  HENT:     Head: Atraumatic.  Eyes:      Conjunctiva/sclera: Conjunctivae normal.  Cardiovascular:     Rate and Rhythm: Normal rate and regular rhythm.     Pulses: Normal pulses.     Heart sounds: Normal heart sounds.  Pulmonary:     Effort: Pulmonary effort is normal.     Breath sounds: Normal breath sounds.  Musculoskeletal:     Cervical back: Neck supple.     Comments: Left foot: Foot is edematous, pale appearance, deep ulceration to pad of L great toe with minimal discharge. Unable to palpate DP pulse.    Right BKA with prosthesis in place  Skin:  Findings: No rash.  Neurological:     Mental Status: He is alert. Mental status is at baseline.    ED Results / Procedures / Treatments   Labs (all labs ordered are listed, but only abnormal results are displayed) Labs Reviewed  COMPREHENSIVE METABOLIC PANEL - Abnormal; Notable for the following components:      Result Value   Glucose, Bld 121 (*)    BUN 26 (*)    Creatinine, Ser 1.54 (*)    Albumin 3.0 (*)    GFR, Estimated 47 (*)    All other components within normal limits  CBC WITH DIFFERENTIAL/PLATELET - Abnormal; Notable for the following components:   WBC 17.2 (*)    RBC 3.36 (*)    Hemoglobin 10.1 (*)    HCT 31.8 (*)    Neutro Abs 10.9 (*)    Lymphs Abs 4.6 (*)    Monocytes Absolute 1.4 (*)    Abs Immature Granulocytes 0.08 (*)    All other components within normal limits  CBG MONITORING, ED - Abnormal; Notable for the following components:   Glucose-Capillary 122 (*)    All other components within normal limits  SARS CORONAVIRUS 2 (TAT 6-24 HRS)  LACTIC ACID, PLASMA  URINALYSIS, ROUTINE W REFLEX MICROSCOPIC    EKG None  Radiology DG Chest 2 View  Result Date: 01/26/2021 CLINICAL DATA:  right toe infection EXAM: CHEST - 2 VIEW COMPARISON:  None. FINDINGS: The cardiomediastinal silhouette is within normal limits. No pleural effusion. No pneumothorax. No mass or consolidation. No acute osseous abnormality. IMPRESSION: No active cardiopulmonary  disease. Electronically Signed   By: Albin Felling M.D.   On: 01/26/2021 08:56   DG Foot 2 Views Left  Result Date: 01/26/2021 CLINICAL DATA:  Toe infection. EXAM: LEFT FOOT - 2 VIEW COMPARISON:  12/08/2020 FINDINGS: Soft tissue wound identified medial aspect of the great toe. Since 12/08/2020, the tuft of the distal phalanx of the great toe has eroded, suggesting osteomyelitis. There is cortical disruption at the medial base of the great toe distal phalanx. Gas is identified in the soft tissues between the first and second toes. Degenerative changes noted first MTP joint. 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 Electronically Signed   By: Misty Stanley M.D.   On: 01/26/2021 08:55    Procedures Procedures   Medications Ordered in ED Medications  oxyCODONE-acetaminophen (PERCOCET/ROXICET) 5-325 MG per tablet 1 tablet (1 tablet Oral Given 01/26/21 1552)    ED Course  I have reviewed the triage vital signs and the nursing notes.  Pertinent labs & imaging results that were available during my care of the patient were reviewed by me and considered in my medical decision making (see chart for details).    MDM Rules/Calculators/A&P                           BP 132/83 (BP Location: Left Arm)   Pulse 72   Temp 98.1 F (36.7 C) (Oral)   Resp 18   SpO2 100%   Final Clinical Impression(s) / ED Diagnoses Final diagnoses:  Osteomyelitis of great toe of left foot ([place])    Rx / DC Orders ED Discharge Orders     None      4:08 PM Patient with a significant history of peripheral vascular disease, diabetes, who has a chronic diabetic ulceration  to his left great toe for the past 2 months.  Sent here from his podiatrist due to nonhealing wound and nonpalpable pedal pulse.  X-ray of the left foot demonstrate osteomyelitis involving the left great toe.  I discussed this  finding with orthopedist Dr. Sharol Given who agrees to be involved in patient care.  He also request a vascular surgeon to be involved in this patient care as patient would likely benefit from ABI and possibly further vascular study.  We will consult medicine for admission.  I will hold off on antibiotic at this time.  Care discussed with Dr. Karle Starch  4:41 PM I have consulted [facility] Dr. Roosevelt Locks who agrees to see and will admit pt for osteomyelitis of L great toe.  I have also consulted vascular surgeon Dr. Scot Dock who will also be involve in pt care.     Domenic Moras, PA-C 01/26/21 1644    Truddie Hidden, MD 01/28/21 [phone]

## 2021-01-26 NOTE — ED Triage Notes (Signed)
Pt here POV. Sent by Dr. Durenda Hurt d/t left great toe wound. Wound not healing as expected and is suppose to be seen by vascular. Pain 8/10. Pt states toe is draining fluid.

## 2021-01-26 NOTE — Progress Notes (Signed)
ABI completed. Refer to "CV Proc" under chart review to view preliminary results.  01/26/2021 5:08 PM Eula Fried., MHA, RVT, RDCS, RDMS

## 2021-01-26 NOTE — Consult Note (Addendum)
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  Given the nonhealing wound and his history of diabetes this is clearly a limb threatening problem.  I have recommended that we proceed with arteriography. I have reviewed with the patient the indications for arteriography. In addition, I have reviewed the potential complications of arteriography including but not limited to: Bleeding, arterial injury, arterial thrombosis, dye action, renal insufficiency, or other unpredictable medical problems. I have explained to the patient that if we find disease amenable to angioplasty we could potentially address this at the same time. I have discussed the potential complications of angioplasty and stenting, including but not limited to: Bleeding, arterial thrombosis, arterial injury, dissection, or the need for surgical intervention.   He has mild renal insufficiency with a GFR of 47.  I will gently hydrate him.  I have ordered a follow-up creatinine in the morning.  I will try to put his arteriogram on for tomorrow afternoon.  CHRONIC VENOUS INSUFFICIENCY: The patient does have evidence of significant chronic venous insufficiency on exam.  He has CEAP C4 venous disease (hyperpigmentation).  Normally we would recommend elevation and compression but first need to address his arterial insufficiency.  REASON FOR CONSULT:    Peripheral vascular disease with osteomyelitis of the left great toe.  The consult is requested by [facility].  HPI:   Joseph [name] is a 75 y.o. male who presents to the emergency department with a nonhealing wound on the left great toe.  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 Sharol Given and he is doing well from that standpoint.  He is ambulatory with a prosthesis.  I do not get any clear-cut history of claudication although his activity  may be fairly limited.  He denies any history of rest pain.  He tells me he developed a blister on his left great toe about 6 weeks ago and podiatry has been working on his wound for a little over a month.  This has not been getting better and he presents with a nonhealing wound of the left great toe.  His risk factors for peripheral vascular disease include type 2 diabetes, hypertension, and a remote history of tobacco use.  He denies any history of hypercholesterolemia or family history of premature cardiovascular disease.  His home meds do include aspirin and Lipitor.  Past Medical History:  Diagnosis Date   Cellulitis of right lower extremity    Diabetes mellitus type 2 in nonobese (HCC)    Gangrene of right foot ([place])    Hypertension    Open wound of right foot     Family History  Problem Relation Age of Onset   Diabetes Father    Cancer Neg Hx    CAD Neg Hx     SOCIAL HISTORY: Social History   Tobacco Use   Smoking status: Never   Smokeless tobacco: Never  Substance Use Topics   Alcohol use: Yes    No Known Allergies  No current facility-administered medications for this encounter.   Current Outpatient Medications  Medication Sig Dispense Refill   Accu-Chek FastClix Lancets MISC Use to check blood sugars three times per day 100 each 11   amLODipine (NORVASC) 10 MG tablet Take 1 tablet (10 mg total) by mouth daily. 90 tablet 3   aspirin EC 81 MG tablet Take 81 mg by mouth daily. Swallow  whole.     atorvastatin (LIPITOR) 10 MG tablet Take 1 tablet (10 mg total) by mouth daily. In the evening 90 tablet 3   Blood Glucose Monitoring Suppl (ACCU-CHEK GUIDE ME) w/Device KIT 1 kit by Does not apply route 3 (three) times daily. To check blood sugars 1 kit 0   cadexomer iodine (IODOSORB) 0.9 % gel Apply 1 application topically 3 (three) times a week. Cleanse wound with wound wash solution.  Apply small dab of iodorsorb and cover with a dry sterile dressing daily 40 g 3    diclofenac sodium (VOLTAREN) 1 % GEL Apply 2 g topically 3 (three) times daily. 2 g 1   doxycycline (VIBRA-TABS) 100 MG tablet Take 1 tablet (100 mg total) by mouth 2 (two) times daily. 28 tablet 0   glipiZIDE (GLUCOTROL) 10 MG tablet TAKE 1 TABLET (10 MG TOTAL) BY MOUTH 2 (TWO) TIMES DAILY BEFORE A MEAL. 60 tablet 2   glucose blood (ACCU-CHEK GUIDE) test strip Use as instructed 100 each 12   hydrochlorothiazide (MICROZIDE) 12.5 MG capsule Take 1 capsule (12.5 mg total) by mouth daily. 90 capsule 1   Insulin Glargine (BASAGLAR KWIKPEN) 100 UNIT/ML Inject 15 Units into the skin daily. 15 mL 3   Insulin Pen Needle 31G X 5 MM MISC use one pen needle once daily. 100 each 1   Lancets Misc. (ACCU-CHEK FASTCLIX LANCET) KIT Use when checking blood sugars three times daily 1 kit 11   Multiple Vitamins-Minerals (ONE-A-DAY MENS 50+ ADVANTAGE) TABS Take 1 tablet by mouth daily with breakfast.     mupirocin ointment (BACTROBAN) 2 % Apply 1 application topically 2 (two) times daily. 22 g 0   oxyCODONE (OXY IR/ROXICODONE) 5 MG immediate release tablet Take 1 tablet (5 mg total) by mouth 2 (two) times daily as needed for severe pain (pain score 7-10). 14 tablet 0   polyethylene glycol (MIRALAX / GLYCOLAX) 17 g packet Take 17 g by mouth daily as needed for mild constipation. 14 each 0    REVIEW OF SYSTEMS:  _0  denotes positive finding, _1  denotes negative finding Cardiac  Comments:  Chest pain or chest pressure:    Shortness of breath upon exertion:    Short of breath when lying flat:    Irregular heart rhythm:        Vascular    Pain in calf, thigh, or hip brought on by ambulation:    Pain in feet at night that wakes you up from your sleep:     Blood clot in your veins:    Leg swelling:  x       Pulmonary    Oxygen at home:    Productive cough:     Wheezing:         Neurologic    Sudden weakness in arms or legs:     Sudden numbness in arms or legs:     Sudden onset of difficulty speaking or  slurred speech:    Temporary loss of vision in one eye:     Problems with dizziness:         Gastrointestinal    Blood in stool:     Vomited blood:         Genitourinary    Burning when urinating:     Blood in urine:        Psychiatric    Major depression:         Hematologic    Bleeding problems:    Problems with  blood clotting too easily:        Skin    Rashes or ulcers: x       Constitutional    Fever or chills:    -  PHYSICAL EXAM:   Vitals:   01/26/21 0756 01/26/21 1110 01/26/21 1401 01/26/21 1724  BP: (!) 147/80 130/75 132/83 138/82  Pulse: 82 71 72 80  Resp: _0 Temp: 99 F (37.2 C) 98.1 F (36.7 C)  98.1 F (36.7 C)  TempSrc: Oral Oral  Oral  SpO2: 100% 100% 100% 98%   There is no height or weight on file to calculate BMI. GENERAL: The patient is a well-nourished male, in no acute distress. The vital signs are documented above. CARDIAC: There is a regular rate and rhythm.  VASCULAR: I do not detect carotid bruits. He does have palpable femoral pulses. On the left side I cannot palpate a popliteal or pedal pulses. He has significant left lower extremity swelling with hyperpigmentation and lipodermatosclerosis.     PULMONARY: There is good air exchange bilaterally without wheezing or rales. ABDOMEN: Soft and non-tender with normal pitched bowel sounds.  I do not palpate an abdominal aortic aneurysm however it is difficult to examine him in a wheelchair in the hallway. MUSCULOSKELETAL: He has a right below the knee amputation. NEUROLOGIC: No focal weakness or paresthesias are detected. SKIN: There are no ulcers or rashes noted. PSYCHIATRIC: The patient has a normal affect.  DATA:    ARTERIAL DOPPLER STUDY: I have independently interpreted his arterial Doppler study of the left lower extremity.  He has a dampened monophasic dorsalis pedis signal only.  There is no posterior tibial signal.  ABI is 20%.  X-RAY: X-ray of the left foot tonight  shows osteomyelitis of the distal phalanx of the left great toe with associated soft tissue wound and also soft tissue gas between the first and second toes.  LABS: His GFR is 47.  Creatinine is 1.54.  Deitra Mayo [facility]

## 2021-01-27 ENCOUNTER — Encounter (HOSPITAL_COMMUNITY)
Admission: EM | Disposition: A | Discharge status: Home / Self Care | Payer: Self-pay | Source: Ambulatory Visit | Attending: Family Medicine

## 2021-01-27 ENCOUNTER — Ambulatory Visit: Payer: Medicare Other | Admitting: Pharmacist

## 2021-01-27 ENCOUNTER — Encounter (HOSPITAL_COMMUNITY): Payer: Self-pay | Admitting: Internal Medicine

## 2021-01-27 DIAGNOSIS — I70245 Atherosclerosis of native arteries of left leg with ulceration of other part of foot: Secondary | ICD-10-CM

## 2021-01-27 DIAGNOSIS — L98499 Non-pressure chronic ulcer of skin of other sites with unspecified severity: Secondary | ICD-10-CM

## 2021-01-27 HISTORY — PX: ABDOMINAL AORTOGRAM W/LOWER EXTREMITY: CATH118223

## 2021-01-27 LAB — CBC
HCT: 31.7 % — ABNORMAL LOW (ref 39.0–52.0)
Hemoglobin: 10.2 g/dL — ABNORMAL LOW (ref 13.0–17.0)
MCH: 30.3 pg (ref 26.0–34.0)
MCHC: 32.2 g/dL (ref 30.0–36.0)
MCV: 94.1 fL (ref 80.0–100.0)
Platelets: 338 10*3/uL (ref 150–400)
RBC: 3.37 MIL/uL — ABNORMAL LOW (ref 4.22–5.81)
RDW: 12.9 % (ref 11.5–15.5)
WBC: 16.4 10*3/uL — ABNORMAL HIGH (ref 4.0–10.5)
nRBC: 0 % (ref 0.0–0.2)

## 2021-01-27 LAB — BASIC METABOLIC PANEL
Anion gap: 8 (ref 5–15)
BUN: 22 mg/dL (ref 8–23)
CO2: 24 mmol/L (ref 22–32)
Calcium: 9.4 mg/dL (ref 8.9–10.3)
Chloride: 105 mmol/L (ref 98–111)
Creatinine, Ser: 1.36 mg/dL — ABNORMAL HIGH (ref 0.61–1.24)
GFR, Estimated: 55 mL/min — ABNORMAL LOW (ref >60)
Glucose, Bld: 149 mg/dL — ABNORMAL HIGH (ref 70–99)
Potassium: 4.2 mmol/L (ref 3.5–5.1)
Sodium: 137 mmol/L (ref 135–145)

## 2021-01-27 LAB — GLUCOSE, CAPILLARY
Glucose-Capillary: 137 mg/dL — ABNORMAL HIGH (ref 70–99)
Glucose-Capillary: 126 mg/dL — ABNORMAL HIGH (ref 70–99)
Glucose-Capillary: 133 mg/dL — ABNORMAL HIGH (ref 70–99)
Glucose-Capillary: 162 mg/dL — ABNORMAL HIGH (ref 70–99)
Glucose-Capillary: 199 mg/dL — ABNORMAL HIGH (ref 70–99)

## 2021-01-27 LAB — URINALYSIS, ROUTINE W REFLEX MICROSCOPIC
Bilirubin Urine: NEGATIVE
Glucose, UA: NEGATIVE mg/dL
Hgb urine dipstick: NEGATIVE
Ketones, ur: NEGATIVE mg/dL
Leukocytes,Ua: NEGATIVE
Nitrite: NEGATIVE
Protein, ur: NEGATIVE mg/dL
Specific Gravity, Urine: 1.016 (ref 1.005–1.030)
pH: 5 (ref 5.0–8.0)

## 2021-01-27 LAB — MRSA NEXT GEN BY PCR, NASAL: MRSA by PCR Next Gen: NOT DETECTED

## 2021-01-27 LAB — HEMOGLOBIN A1C
Hgb A1c MFr Bld: 9.4 % — ABNORMAL HIGH (ref 4.8–5.6)
Mean Plasma Glucose: 223 mg/dL

## 2021-01-27 SURGERY — ABDOMINAL AORTOGRAM W/LOWER EXTREMITY
Anesthesia: LOCAL | Laterality: Left

## 2021-01-27 MED ORDER — SODIUM CHLORIDE 0.9% FLUSH
3.0000 mL | Freq: Two times a day (BID) | INTRAVENOUS | Status: DC
  Administered 2021-01-28: 3 mL via INTRAVENOUS

## 2021-01-27 MED ORDER — HEPARIN (PORCINE) IN NACL 1000-0.9 UT/500ML-% IV SOLN
INTRAVENOUS | Status: DC | PRN
  Administered 2021-01-27 (×2): 500 mL

## 2021-01-27 MED ORDER — SODIUM CHLORIDE 0.9 % IV SOLN: INTRAVENOUS | Status: DC

## 2021-01-27 MED ORDER — SODIUM CHLORIDE 0.9% FLUSH: 3.0000 mL | INTRAVENOUS | Status: DC | PRN

## 2021-01-27 MED ORDER — VANCOMYCIN HCL 2000 MG/400ML IV SOLN
2000.0000 mg | INTRAVENOUS | Status: DC
  Administered 2021-01-28: 2000 mg via INTRAVENOUS
  Filled 2021-01-27: qty 400

## 2021-01-27 MED ORDER — LIDOCAINE HCL (PF) 1 % IJ SOLN
INTRAMUSCULAR | Status: DC | PRN
  Administered 2021-01-27: 15 mL

## 2021-01-27 MED ORDER — FENTANYL CITRATE (PF) 100 MCG/2ML IJ SOLN
INTRAMUSCULAR | Status: DC | PRN
  Administered 2021-01-27: 25 ug via INTRAVENOUS

## 2021-01-27 MED ORDER — SODIUM CHLORIDE 0.9 % WEIGHT BASED INFUSION: 1.0000 mL/kg/h | INTRAVENOUS | Status: AC

## 2021-01-27 MED ORDER — SODIUM CHLORIDE 0.9 % IV SOLN: 250.0000 mL | INTRAVENOUS | Status: DC | PRN

## 2021-01-27 MED ORDER — MIDAZOLAM HCL 2 MG/2ML IJ SOLN
INTRAMUSCULAR | Status: AC
  Filled 2021-01-27: qty 2

## 2021-01-27 MED ORDER — VANCOMYCIN HCL 1500 MG/300ML IV SOLN
1500.0000 mg | INTRAVENOUS | Status: AC
  Administered 2021-01-27: 1500 mg via INTRAVENOUS
  Filled 2021-01-27: qty 300

## 2021-01-27 MED ORDER — OXYCODONE HCL 5 MG PO TABS
5.0000 mg | ORAL_TABLET | Freq: Once | ORAL | Status: AC
  Administered 2021-01-27: 5 mg via ORAL
  Filled 2021-01-27: qty 1

## 2021-01-27 MED ORDER — IODIXANOL 320 MG/ML IV SOLN
INTRAVENOUS | Status: DC | PRN
  Administered 2021-01-27: 93 mL via INTRA_ARTERIAL

## 2021-01-27 MED ORDER — LABETALOL HCL 5 MG/ML IV SOLN: 10.0000 mg | INTRAVENOUS | Status: DC | PRN

## 2021-01-27 MED ORDER — FENTANYL CITRATE (PF) 100 MCG/2ML IJ SOLN
INTRAMUSCULAR | Status: AC
  Filled 2021-01-27: qty 2

## 2021-01-27 MED ORDER — SODIUM CHLORIDE 0.9 % IV SOLN
2.0000 g | Freq: Three times a day (TID) | INTRAVENOUS | Status: DC
  Administered 2021-01-27 – 2021-01-28 (×2): 2 g via INTRAVENOUS
  Filled 2021-01-27 (×3): qty 2

## 2021-01-27 MED ORDER — SODIUM CHLORIDE 0.9 % IV SOLN
2.0000 g | INTRAVENOUS | Status: AC
  Administered 2021-01-27: 2 g via INTRAVENOUS
  Filled 2021-01-27: qty 2

## 2021-01-27 MED ORDER — HYDRALAZINE HCL 20 MG/ML IJ SOLN: 5.0000 mg | INTRAMUSCULAR | Status: DC | PRN

## 2021-01-27 MED ORDER — HEPARIN (PORCINE) IN NACL 1000-0.9 UT/500ML-% IV SOLN
INTRAVENOUS | Status: AC
  Filled 2021-01-27: qty 1000

## 2021-01-27 MED ORDER — MIDAZOLAM HCL 2 MG/2ML IJ SOLN
INTRAMUSCULAR | Status: DC | PRN
  Administered 2021-01-27: 1 mg via INTRAVENOUS

## 2021-01-27 MED ORDER — LIDOCAINE HCL (PF) 1 % IJ SOLN
INTRAMUSCULAR | Status: AC
  Filled 2021-01-27: qty 30

## 2021-01-27 MED ORDER — METRONIDAZOLE 500 MG PO TABS
500.0000 mg | ORAL_TABLET | Freq: Two times a day (BID) | ORAL | Status: DC
  Administered 2021-01-27 – 2021-01-28 (×2): 500 mg via ORAL
  Filled 2021-01-27 (×2): qty 1

## 2021-01-27 MED ORDER — OXYCODONE HCL 5 MG PO TABS
ORAL_TABLET | ORAL | Status: AC
  Filled 2021-01-27: qty 1

## 2021-01-27 SURGICAL SUPPLY — 9 items
CATH OMNI FLUSH 5F 65CM (CATHETERS) ×1 IMPLANT
KIT MICROPUNCTURE NIT STIFF (SHEATH) ×1 IMPLANT
KIT PV (KITS) ×2 IMPLANT
SHEATH PINNACLE 5F 10CM (SHEATH) ×1 IMPLANT
SHEATH PROBE COVER 6X72 (BAG) ×1 IMPLANT
SYR MEDRAD MARK V 150ML (SYRINGE) ×1 IMPLANT
TRANSDUCER W/STOPCOCK (MISCELLANEOUS) ×2 IMPLANT
TRAY PV CATH (CUSTOM PROCEDURE TRAY) ×2 IMPLANT
WIRE BENTSON .035X145CM (WIRE) ×1 IMPLANT

## 2021-01-27 NOTE — Progress Notes (Signed)
PROGRESS NOTE  Joseph Antkowiak  DDU:[id number] DOB: May 18, 1946 DOA: 01/26/2021 PCP: Hoy Register, MD  Outpatient Specialists: Orthopedics, Dr. Lajoyce Corners Brief Narrative: Joseph [name] is a 75 y.o. male with a history of PAD s/p right TTA, IDT2DM, HTN, stage II CKD sent from podiatry due to failure of oral antibiotics to improve left great toe wound and nonpalpable pedal pulses. He was found to have leukocytosis to 17.2k   Assessment & Plan: Active Problems:   Osteomyelitis of great toe of left foot (HCC)   Osteomyelitis (HCC)  Left great toe osteomyelitis, ischemic diabetic wound:  - Wound culture 8/15 reviewed with Klebsiella, proteus. High risk for polymicrobial infection, will broaden to include MRSA coverage, pseudomonas coverage, and anaerobic coverage given his failure of outpatient antibiotics.  - Plan Re: amputation per orthopedics/podiatry. Dr. Lajoyce Corners has seen the patient.   PAD: Severe in LLE on arteriogram 8/31. No revascularization options per vascular surgery.  - Continue ASA, statin  HTN:  - Continue norvasc, HCTZ  IDT2DM:  - Continue lantus + SSI  Stage II CKD:  - Monitor after contrast load 8/31.   DVT prophylaxis: Heparin Hep[place] Code Status: Full Family Communication: None at bedside Disposition Plan:  Status is: Inpatient  Remains inpatient appropriate because:Inpatient level of care appropriate due to severity of illness  Dispo: The patient is from: Home              Anticipated d/c is to: Home              Patient currently is not medically stable to d/c.  Consultants:  Vascular surgery Orthopedics  Procedures:  Arteriogram 01/27/2021 by Dr. Lenell Antu  Antimicrobials: Vancomycin, cefepime, flagyl   Subjective: Pain in left great toe is improved significantly since arrival and today. No fever/chills. "Whatever you're doing, keep doing it."   Objective: Vitals:   01/27/21 1315 01/27/21 1330 01/27/21 1401 01/27/21 1705  BP: 129/75 129/61 129/82 127/72  Pulse:  71 87  73  Resp: 14 17  18   Temp:    97.7 F (36.5 C)  TempSrc:    Oral  SpO2: 100% 99%  98%  Weight:      Height:        Intake/Output Summary (Last 24 hours) at 01/27/2021 1830 Last data filed at 01/27/2021 1500 Gross per 24 hour  Intake 991.87 ml  Output 650 ml  Net 341.87 ml   Filed Weights   01/26/21 1800 01/26/21 2352  Weight: 101.2 kg 98.8 kg    Gen: 75 y.o. male in no distress  Pulm: Non-labored breathing room air. Clear to auscultation bilaterally.  CV: Regular rate and rhythm. No murmur, rub, or gallop. No JVD, no pitting pedal edema. Globally diminished pulses. GI: Abdomen soft, non-tender, non-distended, with normoactive bowel sounds. Inguinal hernia noted without tenderness. No organomegaly or masses felt. Ext: Warm, no deformities Skin: Right femoral access site is c/d/i, hemostatic. Left foot/great toe wrapped, c/d/i Neuro: Alert and oriented. No focal neurological deficits. Psych: Judgement and insight appear normal. Mood & affect appropriate.   Data Reviewed: I have personally reviewed following labs and imaging studies  CBC: Recent Labs  Lab 01/26/21 0826 01/27/21 0331  WBC 17.2* 16.4*  NEUTROABS 10.9*  --   HGB 10.1* 10.2*  HCT 31.8* 31.7*  MCV 94.6 94.1  PLT 315 338   Basic Metabolic Panel: Recent Labs  Lab 01/26/21 0826 01/27/21 0331  NA 139 137  K 4.6 4.2  CL 108 105  CO2 23 24  GLUCOSE  121* 149*  BUN 26* 22  CREATININE 1.54* 1.36*  CALCIUM 9.4 9.4   GFR: Estimated Creatinine Clearance: 59.4 mL/min (A) (by C-G formula based on SCr of 1.36 mg/dL (H)). Liver Function Tests: Recent Labs  Lab 01/26/21 0826  AST 18  ALT 14  ALKPHOS 55  BILITOT 0.7  PROT 7.3  ALBUMIN 3.0*   No results for input(s): LIPASE, AMYLASE in the last 168 hours. No results for input(s): AMMONIA in the last 168 hours. Coagulation Profile: No results for input(s): INR, PROTIME in the last 168 hours. Cardiac Enzymes: No results for input(s): CKTOTAL,  CKMB, CKMBINDEX, TROPONINI in the last 168 hours. BNP (last 3 results) No results for input(s): PROBNP in the last 8760 hours. HbA1C: No results for input(s): HGBA1C in the last 72 hours. CBG: Recent Labs  Lab 01/26/21 2053 01/27/21 0610 01/27/21 0821 01/27/21 0955 01/27/21 1702  GLUCAP 76 137* 126* 133* 162*   Lipid Profile: No results for input(s): CHOL, HDL, LDLCALC, TRIG, CHOLHDL, LDLDIRECT in the last 72 hours. Thyroid Function Tests: No results for input(s): TSH, T4TOTAL, FREET4, T3FREE, THYROIDAB in the last 72 hours. Anemia Panel: No results for input(s): VITAMINB12, FOLATE, FERRITIN, TIBC, IRON, RETICCTPCT in the last 72 hours. Urine analysis:    Component Value Date/Time   COLORURINE YELLOW 01/27/2021 0333   APPEARANCEUR CLEAR 01/27/2021 0333   LABSPEC 1.016 01/27/2021 0333   PHURINE 5.0 01/27/2021 0333   GLUCOSEU NEGATIVE 01/27/2021 0333   HGBUR NEGATIVE 01/27/2021 0333   BILIRUBINUR NEGATIVE 01/27/2021 0333   KETONESUR NEGATIVE 01/27/2021 0333   PROTEINUR NEGATIVE 01/27/2021 0333   UROBILINOGEN 0.2 12/01/2010 1914   NITRITE NEGATIVE 01/27/2021 0333   LEUKOCYTESUR NEGATIVE 01/27/2021 0333   Recent Results (from the past 240 hour(s))  SARS CORONAVIRUS 2 (TAT 6-24 HRS) Nasopharyngeal Nasopharyngeal Swab     Status: None   Collection Time: 01/26/21  3:57 PM   Specimen: Nasopharyngeal Swab  Result Value Ref Range Status   SARS Coronavirus 2 NEGATIVE NEGATIVE Final    Comment: (NOTE) SARS-CoV-2 target nucleic acids are NOT DETECTED.  The SARS-CoV-2 RNA is generally detectable in upper and lower respiratory specimens during the acute phase of infection. Negative results do not preclude SARS-CoV-2 infection, do not rule out co-infections with other pathogens, and should not be used as the sole basis for treatment or other patient management decisions. Negative results must be combined with clinical observations, patient history, and epidemiological information.  The expected result is Negative.  Fact Sheet for Patients: HairSlick.no  Fact Sheet for Healthcare Providers: quierodirigir.com  This test is not yet approved or cleared by the [place] FDA and  has been authorized for detection and/or diagnosis of SARS-CoV-2 by FDA under an Emergency Use Authorization (EUA). This EUA will remain  in effect (meaning this test can be used) for the duration of the COVID-19 declaration under Se ction 564(b)(1) of the Act, 21 U.S.C. section 360bbb-3(b)(1), unless the authorization is terminated or revoked sooner.  Performed at [facility], [address], [place], [place] [address]   MRSA Next Gen by PCR, Nasal     Status: None   Collection Time: 01/27/21  8:10 AM   Specimen: Nasal Mucosa; Nasal Swab  Result Value Ref Range Status   MRSA by PCR Next Gen NOT DETECTED NOT DETECTED Final    Comment: (NOTE) The GeneXpert MRSA Assay (FDA approved for NASAL specimens only), is one component of a comprehensive MRSA colonization surveillance program. It is not intended  to diagnose MRSA infection nor to guide or monitor treatment for MRSA infections. Test performance is not FDA approved in patients less than 73 years old. Performed at [facility], [address] [place], [place] [address]       Radiology Studies: DG Chest 2 View  Result Date: 01/26/2021 CLINICAL DATA:  right toe infection EXAM: CHEST - 2 VIEW COMPARISON:  None. FINDINGS: The cardiomediastinal silhouette is within normal limits. No pleural effusion. No pneumothorax. No mass or consolidation. No acute osseous abnormality. IMPRESSION: No active cardiopulmonary disease. Electronically Signed   By: Olive BassYasser  El-Abd M.D.   On: 01/26/2021 08:56   DG Foot 2 Views Left  Result Date: 01/26/2021 CLINICAL DATA:  Toe infection. EXAM: LEFT FOOT - 2 VIEW COMPARISON:  12/08/2020 FINDINGS: Soft tissue wound identified medial aspect  of the great toe. Since 12/08/2020, the tuft of the distal phalanx of the great toe has eroded, suggesting osteomyelitis. There is cortical disruption at the medial base of the great toe distal phalanx. Gas is identified in the soft tissues between the first and second toes. Degenerative changes noted first MTP joint. 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 Electronically Signed   By: Kennith CenterEric  Mansell M.D.   On: 01/26/2021 08:55   VAS US ABI WITH/WO TBI  Result Date: 01/26/2021  LOWER EXTREMITY DOPPLER STUDY Patient Name:  MontenegroDENMARK Yonke  Date of Exam:   01/26/2021 Medical Rec #: [id number]    Accession #:    [id number] Date of Birth: 11-30-1945    Patient Gender: M Patient Age:   [age] years [facility] Procedure:      VAS US ABI WITH/WO TBI Referring Phys: Fayrene HelperBOWIE TRAN --------------------------------------------------------------------------------  Indications: Ulceration, and peripheral artery disease. High Risk Factors: Hypertension, Diabetes. Other Factors: Right BKA.  Limitations: Today's exam was limited due to an open wound. Comparison Study: 11/14/2018 ABI- right noncompressible, dampened monophasic                   waveforms, left falsely elevated with dampened monophasic                   waveforms. Performing Technologist: Gertie FeySimonetti, Michelle MHA, RVT, RDCS, RDMS  Examination Guidelines: A complete evaluation includes at minimum, Doppler waveform signals and systolic blood pressure reading at the level of bilateral brachial, anterior tibial, and posterior tibial arteries, when vessel segments are accessible. Bilateral testing is considered an integral part of a complete examination. Photoelectric Plethysmograph (PPG) waveforms and toe systolic pressure readings are included as required and additional duplex testing as needed. Limited examinations for reoccurring  indications may be performed as noted.  ABI Findings: +--------+------------------+-----+---------+--------+ Right   Rt Pressure (mmHg)IndexWaveform Comment  +--------+------------------+-----+---------+--------+ [id number]                    triphasic         +--------+------------------+-----+---------+--------+ PTA                                     BKA      +--------+------------------+-----+---------+--------+ DP  BKA      +--------+------------------+-----+---------+--------+ +--------+------------------+-----+-------------------+-------+ Left    Lt Pressure (mmHg)IndexWaveform           Comment +--------+------------------+-----+-------------------+-------+ [id number]                    triphasic                  +--------+------------------+-----+-------------------+-------+ PTA                            absent                     +--------+------------------+-----+-------------------+-------+ DP      32                0.20 dampened monophasic        +--------+------------------+-----+-------------------+-------+ +-------+-----------+-----------+------------+------------+ ABI/TBIToday's ABIToday's TBIPrevious ABIPrevious TBI +-------+-----------+-----------+------------+------------+ Right  BKA        BKA                                 +-------+-----------+-----------+------------+------------+ Left   0.20       Wound                               +-------+-----------+-----------+------------+------------+  Summary:  *See table(s) above for measurements and observations.  Electronically signed by Waverly Ferrari MD on 01/26/2021 at 5:44:59 PM.    Final     Scheduled Meds:  amLODipine  10 mg Oral Daily   aspirin EC  81 mg Oral Daily   atorvastatin  10 mg Oral Daily   heparin  5,000 Units Subcutaneous Q12H   hydrochlorothiazide  12.5 mg Oral Daily   insulin aspart  0-9 Units Subcutaneous TID WC    insulin glargine-yfgn  15 Units Subcutaneous Daily   metroNIDAZOLE  500 mg Oral Q12H   [START ON 01/28/2021] sodium chloride flush  3 mL Intravenous Q12H   Continuous Infusions:  sodium chloride 100 mL/hr at 01/27/21 0629   [START ON 01/28/2021] sodium chloride     sodium chloride 1 mL/kg/hr (01/27/21 1225)   ceFEPime (MAXIPIME) IV     dextrose 5 % and 0.45 % NaCl with KCl 10 mEq/L 75 mL/hr at 01/27/21 1500   [START ON 01/28/2021] vancomycin       LOS: 1 day   Time spent: 35 minutes.  Tyrone Nine, MD [facility] [email or web address] 01/27/2021, 6:30 PM

## 2021-01-27 NOTE — Progress Notes (Signed)
A 5 Fr arterial sheath removed from right groin by Army Melia RN. Pre & Post removal groin site level 0. Pressure held for 20 minutes. Bed rest started at 1015. Left DP dopplered. Vital signs stable. Clean, dry dressing applied. Care instructions given to patient.

## 2021-01-27 NOTE — Progress Notes (Signed)
VASCULAR SURGERY:  We will proceed with his arteriogram today.  His follow-up creatinine this morning is pending.  I will gently hydrate him overnight.  We will make further recommendations pending the results of his arteriogram.  I discussed all this with him this morning and he is agreeable to proceed.  Cari Caraway, MD 5:38 AM

## 2021-01-27 NOTE — Progress Notes (Signed)
VASCULAR SURGERY:  I have reviewed his arteriogram from today.  Everything looks good on the left down to his popliteal.  Below that however his only runoff is a diseased peroneal artery which occludes in the mid to distal calf.  There is reconstitution of a very small DP but this is not a good target.  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  Likewise he was not a candidate for an endovascular revascularization.  Cari Caraway, MD 10:58 AM

## 2021-01-27 NOTE — Op Note (Signed)
DATE OF SERVICE: 01/27/2021  PATIENT:  Joseph [name]  75 y.o. male  PRE-OPERATIVE DIAGNOSIS:  Atherosclerosis of native arteries of left lower extremity causing ulceration  POST-OPERATIVE DIAGNOSIS:  Same  PROCEDURE:   1) US guided right common femoral artery access 2) Aortogram 3) Left lower extremity angiogram with second order cannulation (38mL total contrast) 4) Conscious sedation (26 minutes)  SURGEON:  Rande Brunt. Lenell Antu, MD  ASSISTANT: none  ANESTHESIA:   local and IV sedation  ESTIMATED BLOOD LOSS: min  LOCAL MEDICATIONS USED:  LIDOCAINE   COUNTS: confirmed correct.  PATIENT DISPOSITION:  PACU - hemodynamically stable.   Delay start of Pharmacological VTE agent (>24hrs) due to surgical blood loss or risk of bleeding: no  INDICATION FOR PROCEDURE: Joseph [name] is a 75 y.o. male with ulceration of the left great toe. After careful discussion of risks, benefits, and alternatives the patient was offered angiogram. The patient understood and wished to proceed.  OPERATIVE FINDINGS:  Terminal aorta and iliac arteries: Widely patent without flow limiting stenosis  Left lower extremity: Common femoral artery: Widely patent without flow limiting stenosis  Profunda femoris artery: Widely patent without flow limiting stenosis  Superficial femoral artery: Widely patent without flow limiting stenosis Popliteal artery: Widely patent without flow limiting stenosis Anterior tibial artery: occluded Tibioperoneal trunk: heavily diseased, but patent Peroneal artery: heavily diseased. Only named vessel. Tapers to occlusion in mid calf Posterior tibial artery: occluded Pedal circulation: reconstitution of anterior tibial artery at the foot  GLASS score. Grade 3: expect 53% 1 year re-intervention rate; expect 69% 5 year re-intervention rate; expect 49% 5 year mortality; expect 61% rate of restenosis from open operation at 5 years; expect 83% rate of restenosis from endovascular procedure. P  1.   WIfI score. Stage 4: expected 50% risk of major amputation within the year; revascularization may not reduce risk of amputation.  DESCRIPTION OF PROCEDURE: After identification of the patient in the pre-operative holding area, the patient was transferred to the operating room. The patient was positioned supine on the operating room table. Anesthesia was induced. The groins was prepped and draped in standard fashion. A surgical pause was performed confirming correct patient, procedure, and operative location.  The right groin was anesthetized with subcutaneous injection of 1% lidocaine. Using ultrasound guidance, the right common femoral artery was accessed with micropuncture technique. Fluoroscopy was used to confirm cannulation over the femoral head. The 97F sheath was upsized to 59F.   A Benson wire was advanced into the distal aorta. Over the wire an omni flush catheter was advanced to the level of L2. Aortogram was performed - see above for details.   The left common iliac artery was selected with a Benson guidewire. The wire was advanced into the common femoral artery. Over the wire the omni flush catheter was advanced into the external iliac artery. Selective angiography was performed - see above for details.   The sheath was left in place to be pulled in the recovery area.  Conscious sedation was administered with the use of IV fentanyl and midazolam under continuous physician and nurse monitoring.  Heart rate, blood pressure, and oxygen saturation were continuously monitored.  Total sedation time was 26 minutes  Upon completion of the case instrument and sharps counts were confirmed correct. The patient was transferred to the PACU in good condition. I was present for all portions of the procedure.  PLAN: No good options for limb salvage. A pedal bypass may be technically achievable. I think his ischemia /  tissue loss may be too severe. High risk for amputation.  Rande Brunt. Lenell Antu,  MD [facility] Phone Number: [phone] 01/27/2021 9:18 AM

## 2021-01-27 NOTE — Progress Notes (Signed)
Pharmacy Antibiotic Note  Joseph [name] is a 75 y.o. male admitted on 01/26/2021 with  Osteo .  Pharmacy has been consulted for Vanco, Cefepime dosing.  CC/HPI: Unhealing wound left toe, uncontrolled pain.   PMH: IDDM, HTN, HLD, CKD stage II, chronic left foot wound, R foot gangrene,   ID: Osteo: L foot wound with worsening L big toe infection. S/p several rounds of po abx. most recent doxycycline started 2 weeks ago. - Afebrile. WBC 16.4.Scr 1.36  Cefepime 8/31>> Vanco 8/31>>  8/31: MRSA PCR: negative 8/15: Wound cultures: Klebsiella and Proteus R Ancef and Amp   Plan: Plan:  Cefepime 2g IV q8hr. Vanco 1500mg  IV x 1 sent to cath then..  Vancomycin 2000 mg IV Q 24hrs. Goal AUC 400-550. Expected AUC: 523 SCr used: 1.36    Height: 6\' 4"  (193 cm) Weight: 98.8 kg (217 lb 13 oz) IBW/kg (Calculated) : 86.8  Temp (24hrs), Avg:98.5 F (36.9 C), Min:98.1 F (36.7 C), Max:98.7 F (37.1 C)  Recent Labs  Lab 01/26/21 0826 01/27/21 0331  WBC 17.2* 16.4*  CREATININE 1.54* 1.36*  LATICACIDVEN 0.8  --     Estimated Creatinine Clearance: 59.4 mL/min (A) (by C-G formula based on SCr of 1.36 mg/dL (H)).    No Known Allergies  [name] 01/28/21, PharmD, BCPS Clinical Staff Pharmacist [email or web address]   01/29/21 01/27/2021 1:14 PM

## 2021-01-28 LAB — CBC WITH DIFFERENTIAL/PLATELET
Abs Immature Granulocytes: 0.2 10*3/uL — ABNORMAL HIGH (ref 0.00–0.07)
Basophils Absolute: 0.3 10*3/uL — ABNORMAL HIGH (ref 0.0–0.1)
Basophils Relative: 2 %
Eosinophils Absolute: 0 10*3/uL (ref 0.0–0.5)
Eosinophils Relative: 0 %
HCT: 29.1 % — ABNORMAL LOW (ref 39.0–52.0)
Hemoglobin: 9.5 g/dL — ABNORMAL LOW (ref 13.0–17.0)
Lymphocytes Relative: 24 %
Lymphs Abs: 3.7 10*3/uL (ref 0.7–4.0)
MCH: 30.2 pg (ref 26.0–34.0)
MCHC: 32.6 g/dL (ref 30.0–36.0)
MCV: 92.4 fL (ref 80.0–100.0)
Metamyelocytes Relative: 1 %
Monocytes Absolute: 0.3 10*3/uL (ref 0.1–1.0)
Monocytes Relative: 2 %
Neutro Abs: 10.9 10*3/uL — ABNORMAL HIGH (ref 1.7–7.7)
Neutrophils Relative %: 71 %
Platelets: 294 10*3/uL (ref 150–400)
RBC: 3.15 MIL/uL — ABNORMAL LOW (ref 4.22–5.81)
RDW: 12.8 % (ref 11.5–15.5)
WBC: 15.4 10*3/uL — ABNORMAL HIGH (ref 4.0–10.5)
nRBC: 0 % (ref 0.0–0.2)
nRBC: 0 /100 WBC

## 2021-01-28 LAB — BASIC METABOLIC PANEL
Anion gap: 7 (ref 5–15)
BUN: 16 mg/dL (ref 8–23)
CO2: 22 mmol/L (ref 22–32)
Calcium: 8.5 mg/dL — ABNORMAL LOW (ref 8.9–10.3)
Chloride: 105 mmol/L (ref 98–111)
Creatinine, Ser: 1.26 mg/dL — ABNORMAL HIGH (ref 0.61–1.24)
GFR, Estimated: >60 mL/min (ref >60)
Glucose, Bld: 153 mg/dL — ABNORMAL HIGH (ref 70–99)
Potassium: 4.1 mmol/L (ref 3.5–5.1)
Sodium: 134 mmol/L — ABNORMAL LOW (ref 135–145)

## 2021-01-28 LAB — GLUCOSE, CAPILLARY
Glucose-Capillary: 131 mg/dL — ABNORMAL HIGH (ref 70–99)
Glucose-Capillary: 223 mg/dL — ABNORMAL HIGH (ref 70–99)

## 2021-01-28 MED ORDER — DOXYCYCLINE HYCLATE 100 MG PO TABS: 100.0000 mg | ORAL_TABLET | Freq: Two times a day (BID) | ORAL | 0 refills | Status: DC

## 2021-01-28 MED ORDER — HYDROCODONE-ACETAMINOPHEN 5-325 MG PO TABS
1.0000 | ORAL_TABLET | Freq: Once | ORAL | Status: AC
  Administered 2021-01-28: 1 via ORAL

## 2021-01-28 MED ORDER — HYDROCODONE-ACETAMINOPHEN 5-325 MG PO TABS: 1.0000 | ORAL_TABLET | ORAL | 0 refills | Status: DC | PRN

## 2021-01-28 MED ORDER — AMOXICILLIN-POT CLAVULANATE 875-125 MG PO TABS: 1.0000 | ORAL_TABLET | Freq: Two times a day (BID) | ORAL | 0 refills | Status: DC

## 2021-01-28 NOTE — Consult Note (Signed)
   [facility] Inpatient Consult   01/28/2021  Joseph [name] 08-Sep-1945 [id number]  [facility] [[facility]]  Accountable Care Organization [ACO] Patient: Medicare CMS DCE  Primary Care Provider:  Hoy Register, MD [facility] and [facility]ness does the [facility] follow up calls/appointments  Patient is currently active with [facility] [[facility]] Care Management for chronic disease management services [Diabetes and Hypertension].  Patient has been engaged by a [facility].    Our community based plan of care has focused on disease management and community resource support.  Spoke with patient, HIPAA verified,  regarding post hospital follow up planned and consents to ongoing follow up with [facility] RNCM.   Plan: Will update RNCM of ongoing needs planned during TOC.   Will alert Inpatient Transition Of Care [TOC] team member to make aware that [facility] Care Management following.   Of note, [facility] Care Management services does not replace or interfere with any services that are needed or arranged by inpatient [facility] care management team.  For additional questions or referrals please contact:   Charlesetta Shanks, RN BSN CCM [facility]  [phone] business mobile phone Toll free office [phone]  Fax number: [phone] [email or web address]

## 2021-01-28 NOTE — Progress Notes (Signed)
Patient ID: Joseph [name], male   DOB: 1945-12-08, 75 y.o.   MRN: [id number] Patient is seen in follow-up for gangrenous ulcer left great toe status post arterial intervention for the left lower extremity.  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  Discussed with the patient I do not feel that an amputation of the great toe would heal due to the lack of circulation distally.  Feels best option would be to proceed with a transtibial amputation on the left similar to the right.  Patient states he understands would like to proceed with surgery but states he would like to go home first to organize his finances.  I will call in a prescription for doxycycline and Vicodin for discharge I will follow-up in the office in 1 week anticipate surgery in 2 weeks.

## 2021-01-28 NOTE — Plan of Care (Signed)
  Problem: Education: Goal: Knowledge of General Education information will improve Description: Including pain rating scale, medication(s)/side effects and non-pharmacologic comfort measures Outcome: Adequate for Discharge   

## 2021-01-28 NOTE — Plan of Care (Signed)

## 2021-01-28 NOTE — Progress Notes (Signed)
   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  I agree with the plan as outlined by Dr. Lajoyce Corners.  Vascular surgery will be available as needed.   SUBJECTIVE:   No specific complaints.  PHYSICAL EXAM:   Vitals:   01/27/21 1705 01/27/21 1953 01/27/21 2345 01/28/21 0358  BP: 127/72 121/73 108/62 (!) 139/93  Pulse: 73 73 65 69  Resp: 18 18 14 10   Temp: 97.7 F (36.5 C) 99.6 F (37.6 C) 99.6 F (37.6 C) 99.3 F (37.4 C)  TempSrc: Oral Oral Oral Oral  SpO2: 98% 99% 100% 96%  Weight:      Height:       His dressing on the left foot is dry. No hematoma right groin where he had his cath.  LABS:   Lab Results  Component Value Date   WBC 15.4 (H) 01/28/2021   HGB 9.5 (L) 01/28/2021   HCT 29.1 (L) 01/28/2021   MCV 92.4 01/28/2021   PLT 294 01/28/2021   Lab Results  Component Value Date   CREATININE 1.26 (H) 01/28/2021   CBG (last 3)  Recent Labs    01/27/21 1702 01/27/21 2112 01/28/21 0631  GLUCAP 162* 199* 131*    PROBLEM LIST:    Active Problems:   Osteomyelitis of great toe of left foot (HCC)   Osteomyelitis (HCC)   CURRENT MEDS:    amLODipine  10 mg Oral Daily   aspirin EC  81 mg Oral Daily   atorvastatin  10 mg Oral Daily   heparin  5,000 Units Subcutaneous Q12H   hydrochlorothiazide  12.5 mg Oral Daily   insulin aspart  0-9 Units Subcutaneous TID WC   insulin glargine-yfgn  15 Units Subcutaneous Daily   metroNIDAZOLE  500 mg Oral Q12H   sodium chloride flush  3 mL Intravenous Q12H    03/30/21 Office: [phone] 01/28/2021

## 2021-01-28 NOTE — Discharge Summary (Signed)
Physician Discharge Summary  Joseph Guiana [place]hill TKZ:[id number] DOB: Jan 19, 1946 DOA: 01/26/2021  PCP: Charlott Rakes, MD  Admit date: 01/26/2021 Discharge date: 01/28/2021  Admitted From: Home Disposition: Home   Recommendations for Outpatient Follow-up:  Follow up with Dr. Sharol Given. Pt will need amputation.  Home Health: None new Equipment/Devices: None new Discharge Condition: Stable CODE STATUS: Full Diet recommendation: Heart healthy  Brief/Interim Summary: Joseph [name] is a 75 y.o. male with a history of PAD s/p right TTA, IDT2DM, HTN, stage II CKD sent from podiatry due to failure of oral antibiotics to improve left great toe wound and nonpalpable pedal pulses. He was found to have leukocytosis to 17.2k. Angiogram revealed severe vascular disease without revascularization options. Amputation was recommended though the patient declined, opting to go home with antibiotics and follow up with Dr. Sharol Given.   Discharge Diagnoses:  Active Problems:   Osteomyelitis of great toe of left foot (HCC)   Osteomyelitis (HCC)  Left great toe osteomyelitis, ischemic diabetic wound:  - Wound culture 8/15 reviewed with Klebsiella, proteus. High risk for polymicrobial infection, will broaden to include MRSA coverage, pseudomonas coverage, and anaerobic coverage given his failure of outpatient antibiotics.  - Plan Re: amputation per orthopedics/podiatry. Dr. Sharol Given has seen the patient.    PAD: Severe in LLE on arteriogram 8/31. No revascularization options per vascular surgery.  - Continue ASA, statin   HTN:  - Continue norvasc, HCTZ   IDT2DM:  - Continue lantus + SSI   Stage II CKD:  - Monitor after contrast load 8/31.   Discharge Instructions Discharge Instructions     Diet - low sodium heart healthy   Complete by: As directed    Discharge instructions   Complete by: As directed    Take doxycycline and augmentin both twice per day until you follow up with Dr. Sharol Given. Call his office to see him in about  a week or seek medical attention right away if you develop worsening pain, fever, or other new or concerning symptoms.   Increase activity slowly   Complete by: As directed    Leave dressing on - Keep it clean, dry, and intact until clinic visit   Complete by: As directed       Allergies as of 01/28/2021   No Known Allergies      Medication List     STOP taking these medications    oxyCODONE 5 MG immediate release tablet Commonly known as: Oxy IR/ROXICODONE       TAKE these medications    Accu-Chek FastClix Lancet Kit Use when checking blood sugars three times daily What changed:  how much to take how to take this when to take this   Accu-Chek FastClix Lancets Misc Use to check blood sugars three times per day What changed:  how much to take how to take this when to take this   Accu-Chek Guide Me w/Device Kit 1 kit by Does not apply route 3 (three) times daily. To check blood sugars   Accu-Chek Guide test strip Generic drug: glucose blood Use as instructed What changed:  how much to take how to take this when to take this   amLODipine 10 MG tablet Commonly known as: NORVASC Take 1 tablet (10 mg total) by mouth daily.   amoxicillin-clavulanate 875-125 MG tablet Commonly known as: Augmentin Take 1 tablet by mouth 2 (two) times daily.   aspirin EC 81 MG tablet Take 81 mg by mouth daily. Swallow whole.   atorvastatin 10 MG tablet Commonly known  as: LIPITOR Take 1 tablet (10 mg total) by mouth daily. In the evening   B-D UF III MINI PEN NEEDLES 31G X 5 MM Misc Generic drug: Insulin Pen Needle use one pen needle once daily.   Basaglar KwikPen 100 UNIT/ML Inject 15 Units into the skin daily.   cadexomer iodine 0.9 % gel Commonly known as: IODOSORB Apply 1 application topically 3 (three) times a week. Cleanse wound with wound wash solution.  Apply small dab of iodorsorb and cover with a dry sterile dressing daily   diclofenac sodium 1 % Gel Commonly  known as: VOLTAREN Apply 2 g topically 3 (three) times daily.   doxycycline 100 MG tablet Commonly known as: VIBRA-TABS Take 1 tablet (100 mg total) by mouth 2 (two) times daily.   glipiZIDE 10 MG tablet Commonly known as: GLUCOTROL TAKE 1 TABLET (10 MG TOTAL) BY MOUTH 2 (TWO) TIMES DAILY BEFORE A MEAL. What changed:  how much to take when to take this   hydrochlorothiazide 12.5 MG capsule Commonly known as: Microzide Take 1 capsule (12.5 mg total) by mouth daily.   HYDROcodone-acetaminophen 5-325 MG tablet Commonly known as: NORCO/VICODIN Take 1 tablet by mouth every 4 (four) hours as needed.   mupirocin ointment 2 % Commonly known as: BACTROBAN Apply 1 application topically 2 (two) times daily.   One-A-Day Mens 50+ Advantage Tabs Take 1 tablet by mouth daily with breakfast.   polyethylene glycol 17 g packet Commonly known as: MIRALAX / GLYCOLAX Take 17 g by mouth daily as needed for mild constipation.               Discharge Care Instructions  (From admission, onward)           Start     Ordered   01/28/21 0000  Leave dressing on - Keep it clean, dry, and intact until clinic visit        01/28/21 0856            Follow-up Information     Newt Minion, MD Follow up in 1 week(s).   Specialty: Orthopedic Surgery Contact information: [place] [address] [phone]         Charlott Rakes, MD Follow up.   Specialty: Family Medicine Contact information: [place] [address] [phone]         Fay Records, MD .   Specialty: Cardiology Contact information: [address] [place] [address] [phone]                No Known Allergies  Consultations: Orthopedics, Dr. Sharol Given. Vascular surgery.   Procedures/Studies: DG Chest 2 View  Result Date: 01/26/2021 CLINICAL DATA:  right toe infection EXAM: CHEST - 2 VIEW COMPARISON:  None. FINDINGS: The  cardiomediastinal silhouette is within normal limits. No pleural effusion. No pneumothorax. No mass or consolidation. No acute osseous abnormality. IMPRESSION: No active cardiopulmonary disease. Electronically Signed   By: Albin Felling M.D.   On: 01/26/2021 08:56   PERIPHERAL VASCULAR CATHETERIZATION  Result Date: 01/28/2021 DATE OF SERVICE: 01/27/2021  PATIENT:  Joseph [name]  75 y.o. male  PRE-OPERATIVE DIAGNOSIS:  Atherosclerosis of native arteries of left lower extremity causing ulceration  POST-OPERATIVE DIAGNOSIS:  Same  PROCEDURE:  1) US guided right common femoral artery access 2) Aortogram 3) Left lower extremity angiogram with second order cannulation (30m total contrast) 4) Conscious sedation (26 minutes)  SURGEON:  TYevonne Aline HStanford Breed MD  ASSISTANT: none  ANESTHESIA:   local and IV sedation  ESTIMATED BLOOD LOSS: min  LOCAL MEDICATIONS USED:  LIDOCAINE  COUNTS: confirmed correct.  PATIENT DISPOSITION:  PACU - hemodynamically stable.  Delay start of Pharmacological VTE agent (>24hrs) due to surgical blood loss or risk of bleeding: no  INDICATION FOR PROCEDURE: Joseph [name] is a 75 y.o. male with ulceration of the left great toe. After careful discussion of risks, benefits, and alternatives the patient was offered angiogram. The patient understood and wished to proceed.  OPERATIVE FINDINGS: Terminal aorta and iliac arteries: Widely patent without flow limiting stenosis  Left lower extremity: Common femoral artery: Widely patent without flow limiting stenosis Profunda femoris artery: Widely patent without flow limiting stenosis Superficial femoral artery: Widely patent without flow limiting stenosis Popliteal artery: Widely patent without flow limiting stenosis Anterior tibial artery: occluded Tibioperoneal trunk: heavily diseased, but patent Peroneal artery: heavily diseased. Only named vessel. Tapers to occlusion in mid calf Posterior tibial artery: occluded Pedal circulation: reconstitution of anterior  tibial artery at the foot  GLASS score. Grade 3: expect 53% 1 year re-intervention rate; expect 69% 5 year re-intervention rate; expect 49% 5 year mortality; expect 61% rate of restenosis from open operation at 5 years; expect 83% rate of restenosis from endovascular procedure. P 1.  WIfI score. Stage 4: expected 50% risk of major amputation within the year; revascularization may not reduce risk of amputation.  DESCRIPTION OF PROCEDURE: After identification of the patient in the pre-operative holding area, the patient was transferred to the operating room. The patient was positioned supine on the operating room table. Anesthesia was induced. The groins was prepped and draped in standard fashion. A surgical pause was performed confirming correct patient, procedure, and operative location.  The right groin was anesthetized with subcutaneous injection of 1% lidocaine. Using ultrasound guidance, the right common femoral artery was accessed with micropuncture technique. Fluoroscopy was used to confirm cannulation over the femoral head. The 39F sheath was upsized to 24F.  A Benson wire was advanced into the distal aorta. Over the wire an omni flush catheter was advanced to the level of L2. Aortogram was performed - see above for details.  The left common iliac artery was selected with a Benson guidewire. The wire was advanced into the common femoral artery. Over the wire the omni flush catheter was advanced into the external iliac artery. Selective angiography was performed - see above for details.  The sheath was left in place to be pulled in the recovery area.  Conscious sedation was administered with the use of IV fentanyl and midazolam under continuous physician and nurse monitoring.  Heart rate, blood pressure, and oxygen saturation were continuously monitored.  Total sedation time was 26 minutes  Upon completion of the case instrument and sharps counts were confirmed correct. The patient was transferred to the PACU in  good condition. I was present for all portions of the procedure.  PLAN: No good options for limb salvage. A pedal bypass may be technically achievable. I think his ischemia / tissue loss may be too severe. High risk for amputation.  Yevonne Aline. Stanford Breed, MD [facility] Phone Number: [phone] 01/27/2021 9:18 AM   MR TOES LEFT W WO CONTRAST  Result Date: 01/09/2021 CLINICAL DATA:  Foot swelling, diabetic, osteomyelitis suspected, xray done EXAM: MRI OF THE LEFT TOES WITHOUT AND WITH CONTRAST TECHNIQUE: Multiplanar, multisequence MR imaging of the left forefoot was performed both before and after administration of intravenous contrast. CONTRAST:  43m MULTIHANCE GADOBENATE DIMEGLUMINE 529 MG/ML IV SOLN COMPARISON:  X-ray 12/08/2020 FINDINGS: Bones/Joint/Cartilage Soft tissue ulceration at the plantar aspect of the great toe distal phalanx with marked bone marrow edema throughout the distal phalanx and associated confluent low T1 marrow signal compatible with acute osteomyelitis. There is bone marrow edema within the distal 1.5 cm of the great toe proximal phalanx extending to the interphalangeal joint with subtle intermediate T1 marrow signal suggestive of reactive osteitis or early acute osteomyelitis. 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 There is also ankylosis of the medial and intermediate cuneiform bones. Patchy edema-like bone marrow signal within the midfoot, which is likely degenerative/reactive. Mild degenerative changes involving the midfoot and first MTP joint. No acute fracture or dislocation. Ligaments Collateral ligaments of the distal forefoot appear grossly intact. Muscles and Tendons Denervation changes of the intrinsic foot musculature. No tenosynovial fluid collections. Soft tissues Plantar soft tissue ulceration underlies the great toe distal phalanx. Diffuse soft tissue edema throughout the distal forefoot. No organized fluid collection.  IMPRESSION: 1. Acute osteomyelitis of the left great toe distal phalanx. 2. Findings suggestive of reactive osteitis or early acute osteomyelitis involving the proximal phalanx of the great toe. 3. Soft tissue ulceration along the plantar aspect of the great toe. Diffuse soft tissue edema throughout the distal forefoot, suggesting cellulitis. No organized fluid collection. 4. 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 Electronically Signed   By: NDavina PokeD.O.   On: 01/09/2021 15:21   DG Foot 2 Views Left  Result Date: 01/26/2021 CLINICAL DATA:  Toe infection. EXAM: LEFT FOOT - 2 VIEW COMPARISON:  12/08/2020 FINDINGS: Soft tissue wound identified medial aspect of the great toe. Since 12/08/2020, the tuft of the distal phalanx of the great toe has eroded, suggesting osteomyelitis. There is cortical disruption at the medial base of the great toe distal phalanx. Gas is identified in the soft tissues between the first and second toes. Degenerative changes noted first MTP joint. 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 Electronically Signed   By: EMisty StanleyM.D.   On: 01/26/2021 08:55   DG Toe Great Left  Result Date: 01/30/2021 CLINICAL DATA:  Recent osteomyelitis, worsening wound for 1 week in the plantar left first toe EXAM: LEFT GREAT TOE COMPARISON:  01/26/2021 left foot radiographs FINDINGS: Generalized soft tissue swelling. Soft tissue defect in plantar/medial distal left first toe. Loss of cortex compatible with erosive change throughout the medial/plantar aspect of the distal phalanx left first toe, similar to slightly worsened. New mild erosive changes in the medial distal aspect of the proximal phalanx in the left first toe. No focal osseous lesions. No fracture. No dislocation. No radiopaque foreign bodies. IMPRESSION:  Generalized soft tissue swelling. Soft tissue defect in the plantar/medial distal left first toe. Loss of cortex compatible with erosive change throughout the medial/plantar aspect of the distal phalanx left first toe, similar to slightly worsened. New mild erosive changes in the medial distal aspect of the proximal phalanx in the left first toe. These findings are compatible with progression of acute osteomyelitis. Electronically Signed   By: JIlona SorrelM.D.   On: 01/30/2021 06:36   VAS [place]I WITH/WO TBI  Result Date: 01/26/2021  LOWER EXTREMITY DOPPLER STUDY Patient Name:  DFrench GuianaPOE  Date of Exam:   01/26/2021 Medical Rec #: [id number]  Accession #:    [id number] Date of Birth: 11/29/1945    Patient Gender: M Patient Age:   39 years Exam Location:  [facility] Procedure:      VAS [place] ABI WITH/WO TBI Referring Phys: BOWIE TRAN --------------------------------------------------------------------------------  Indications: Ulceration, and peripheral artery disease. High Risk Factors: Hypertension, Diabetes. Other Factors: Right BKA.  Limitations: Today's exam was limited due to an open wound. Comparison Study: 11/14/2018 ABI- right noncompressible, dampened monophasic                   waveforms, left falsely elevated with dampened monophasic                   waveforms. Performing Technologist: Maudry Mayhew MHA, RVT, RDCS, RDMS  Examination Guidelines: A complete evaluation includes at minimum, Doppler waveform signals and systolic blood pressure reading at the level of bilateral brachial, anterior tibial, and posterior tibial arteries, when vessel segments are accessible. Bilateral testing is considered an integral part of a complete examination. Photoelectric Plethysmograph (PPG) waveforms and toe systolic pressure readings are included as required and additional duplex testing as needed. Limited examinations for reoccurring indications may be performed as noted.  ABI Findings:  +--------+------------------+-----+---------+--------+ Right   Rt Pressure (mmHg)IndexWaveform Comment  +--------+------------------+-----+---------+--------+ [id number]                    triphasic         +--------+------------------+-----+---------+--------+ PTA                                     BKA      +--------+------------------+-----+---------+--------+ DP                                      BKA      +--------+------------------+-----+---------+--------+ +--------+------------------+-----+-------------------+-------+ Left    Lt Pressure (mmHg)IndexWaveform           Comment +--------+------------------+-----+-------------------+-------+ [id number]                    triphasic                  +--------+------------------+-----+-------------------+-------+ PTA                            absent                     +--------+------------------+-----+-------------------+-------+ DP      32                0.20 dampened monophasic        +--------+------------------+-----+-------------------+-------+ +-------+-----------+-----------+------------+------------+ ABI/TBIToday's ABIToday's TBIPrevious ABIPrevious TBI +-------+-----------+-----------+------------+------------+ Right  BKA        BKA                                 +-------+-----------+-----------+------------+------------+ Left   0.20       Wound                               +-------+-----------+-----------+------------+------------+  Summary:  *See table(s) above for measurements and observations.  Electronically signed by Deitra Mayo MD on 01/26/2021 at 5:44:59 PM.    Final  Subjective: Feels fine, wants to go home. Understands risks associated with inadequately treated osteomyelitis and need for amputation, but not ready yet.   Discharge Exam: Vitals:   01/28/21 0813 01/28/21 1120  BP: 123/61 129/64  Pulse: 69 74  Resp: 16 19  Temp: 98.9 F (37.2 C) 99.3  F (37.4 C)  SpO2: 99% 99%   General: Pt is alert, awake, not in acute distress Cardiovascular: RRR, S1/S2 +, no rubs, no gallops Respiratory: CTA bilaterally, no wheezing, no rhonchi Abdominal: Soft, NT, ND, bowel sounds + Extremities: Right femoral access site is c/d/i, hemostatic. Left foot/great toe wrapped, c/d/i  Labs: BNP (last 3 results) No results for input(s): BNP in the last 8760 hours. Basic Metabolic Panel: Recent Labs  Lab 01/26/21 0826 01/27/21 0331 01/28/21 0225 01/30/21 0556  NA 139 137 134* 133*  K 4.6 4.2 4.1 3.9  CL 108 105 105 101  CO2 23 24 22  21*  GLUCOSE 121* 149* 153* 145*  BUN 26* 22 16 20   CREATININE 1.54* 1.36* 1.26* 1.52*  CALCIUM 9.4 9.4 8.5* 9.3   Liver Function Tests: Recent Labs  Lab 01/26/21 0826 01/30/21 0556  AST 18 31  ALT 14 21  ALKPHOS 55 62  BILITOT 0.7 1.1  PROT 7.3 7.8  ALBUMIN 3.0* 3.1*   No results for input(s): LIPASE, AMYLASE in the last 168 hours. No results for input(s): AMMONIA in the last 168 hours. CBC: Recent Labs  Lab 01/26/21 0826 01/27/21 0331 01/28/21 0225 01/30/21 0556  WBC 17.2* 16.4* 15.4* 19.7*  NEUTROABS 10.9*  --  10.9* 12.0*  HGB 10.1* 10.2* 9.5* 10.0*  HCT 31.8* 31.7* 29.1* 30.3*  MCV 94.6 94.1 92.4 91.8  PLT 315 338 294 352   Cardiac Enzymes: No results for input(s): CKTOTAL, CKMB, CKMBINDEX, TROPONINI in the last 168 hours. BNP: Invalid input(s): POCBNP CBG: Recent Labs  Lab 01/27/21 2112 01/28/21 0631 01/28/21 1134 01/30/21 1642 01/30/21 1727  GLUCAP 199* 131* 223* 68* 140*   D-Dimer No results for input(s): DDIMER in the last 72 hours. Hgb A1c No results for input(s): HGBA1C in the last 72 hours.  Lipid Profile No results for input(s): CHOL, HDL, LDLCALC, TRIG, CHOLHDL, LDLDIRECT in the last 72 hours. Thyroid function studies No results for input(s): TSH, T4TOTAL, T3FREE, THYROIDAB in the last 72 hours.  Invalid input(s): FREET3 Anemia work up No results for input(s):  VITAMINB12, FOLATE, FERRITIN, TIBC, IRON, RETICCTPCT in the last 72 hours. Urinalysis    Component Value Date/Time   COLORURINE YELLOW 01/27/2021 0333   APPEARANCEUR CLEAR 01/27/2021 0333   LABSPEC 1.016 01/27/2021 0333   PHURINE 5.0 01/27/2021 0333   GLUCOSEU NEGATIVE 01/27/2021 0333   HGBUR NEGATIVE 01/27/2021 0333   BILIRUBINUR NEGATIVE 01/27/2021 0333   KETONESUR NEGATIVE 01/27/2021 0333   PROTEINUR NEGATIVE 01/27/2021 0333   UROBILINOGEN 0.2 12/01/2010 1914   NITRITE NEGATIVE 01/27/2021 0333   LEUKOCYTESUR NEGATIVE 01/27/2021 0333    Microbiology Recent Results (from the past 240 hour(s))  SARS CORONAVIRUS 2 (TAT 6-24 HRS) Nasopharyngeal Nasopharyngeal Swab     Status: None   Collection Time: 01/26/21  3:57 PM   Specimen: Nasopharyngeal Swab  Result Value Ref Range Status   SARS Coronavirus 2 NEGATIVE NEGATIVE Final    Comment: (NOTE) SARS-CoV-2 target nucleic acids are NOT DETECTED.  The SARS-CoV-2 RNA is generally detectable in upper and lower respiratory specimens during the acute phase of infection. Negative results do not preclude SARS-CoV-2 infection, do not rule out co-infections with other pathogens,  and should not be used as the sole basis for treatment or other patient management decisions. Negative results must be combined with clinical observations, patient history, and epidemiological information. The expected result is Negative.  Fact Sheet for Patients: SugarRoll.be  Fact Sheet for Healthcare Providers: https://www.woods-mathews.com/  This test is not yet approved or cleared by the [place] FDA and  has been authorized for detection and/or diagnosis of SARS-CoV-2 by FDA under an Emergency Use Authorization (EUA). This EUA will remain  in effect (meaning this test can be used) for the duration of the COVID-19 declaration under Se ction 564(b)(1) of the Act, 21 U.S.C. section 360bbb-3(b)(1), unless the  authorization is terminated or revoked sooner.  Performed at [facility], [place] [address], [place], [place] [address]   MRSA Next Gen by PCR, Nasal     Status: None   Collection Time: 01/27/21  8:10 AM   Specimen: Nasal Mucosa; Nasal Swab  Result Value Ref Range Status   MRSA by PCR Next Gen NOT DETECTED NOT DETECTED Final    Comment: (NOTE) The GeneXpert MRSA Assay (FDA approved for NASAL specimens only), is one component of a comprehensive MRSA colonization surveillance program. It is not intended to diagnose MRSA infection nor to guide or monitor treatment for MRSA infections. Test performance is not FDA approved in patients less than 71 years old. Performed at [facility], [place] [address], [place], [place] [address]     Time coordinating discharge: Approximately 40 minutes  Patrecia Pour, MD  [facility] 01/30/2021, 6:14 PM

## 2021-01-28 NOTE — Progress Notes (Signed)
Discharge instructions (including medications) discussed with and copy provided to patient/caregiver 

## 2021-01-29 ENCOUNTER — Encounter (HOSPITAL_COMMUNITY): Payer: Self-pay | Admitting: Vascular Surgery

## 2021-01-30 ENCOUNTER — Encounter (HOSPITAL_COMMUNITY): Payer: Self-pay

## 2021-01-30 ENCOUNTER — Emergency Department (HOSPITAL_COMMUNITY): Payer: Medicare Other

## 2021-01-30 ENCOUNTER — Inpatient Hospital Stay (HOSPITAL_COMMUNITY)
Admission: EM | Admit: 2021-01-30 | Discharge: 2021-02-04 | DRG: 240 | Disposition: A | Discharge status: Rehabilitation Facility | Payer: Medicare Other | Attending: Family Medicine | Admitting: Family Medicine

## 2021-01-30 ENCOUNTER — Other Ambulatory Visit: Payer: Self-pay

## 2021-01-30 DIAGNOSIS — Z7982 Long term (current) use of aspirin: Secondary | ICD-10-CM | POA: Diagnosis not present

## 2021-01-30 DIAGNOSIS — L089 Local infection of the skin and subcutaneous tissue, unspecified: Secondary | ICD-10-CM | POA: Diagnosis not present

## 2021-01-30 DIAGNOSIS — E11621 Type 2 diabetes mellitus with foot ulcer: Secondary | ICD-10-CM | POA: Diagnosis not present

## 2021-01-30 DIAGNOSIS — Z79899 Other long term (current) drug therapy: Secondary | ICD-10-CM

## 2021-01-30 DIAGNOSIS — Z4781 Encounter for orthopedic aftercare following surgical amputation: Secondary | ICD-10-CM | POA: Diagnosis not present

## 2021-01-30 DIAGNOSIS — I130 Hypertensive heart and chronic kidney disease with heart failure and stage 1 through stage 4 chronic kidney disease, or unspecified chronic kidney disease: Secondary | ICD-10-CM | POA: Diagnosis present

## 2021-01-30 DIAGNOSIS — E1142 Type 2 diabetes mellitus with diabetic polyneuropathy: Secondary | ICD-10-CM | POA: Diagnosis present

## 2021-01-30 DIAGNOSIS — K5900 Constipation, unspecified: Secondary | ICD-10-CM | POA: Diagnosis present

## 2021-01-30 DIAGNOSIS — M79672 Pain in left foot: Secondary | ICD-10-CM | POA: Diagnosis not present

## 2021-01-30 DIAGNOSIS — N183 Chronic kidney disease, stage 3 unspecified: Secondary | ICD-10-CM | POA: Diagnosis present

## 2021-01-30 DIAGNOSIS — E1152 Type 2 diabetes mellitus with diabetic peripheral angiopathy with gangrene: Principal | ICD-10-CM | POA: Diagnosis present

## 2021-01-30 DIAGNOSIS — L929 Granulomatous disorder of the skin and subcutaneous tissue, unspecified: Secondary | ICD-10-CM | POA: Diagnosis not present

## 2021-01-30 DIAGNOSIS — M8600 Acute hematogenous osteomyelitis, unspecified site: Secondary | ICD-10-CM

## 2021-01-30 DIAGNOSIS — D62 Acute posthemorrhagic anemia: Secondary | ICD-10-CM | POA: Diagnosis not present

## 2021-01-30 DIAGNOSIS — E669 Obesity, unspecified: Secondary | ICD-10-CM | POA: Diagnosis not present

## 2021-01-30 DIAGNOSIS — M7989 Other specified soft tissue disorders: Secondary | ICD-10-CM | POA: Diagnosis not present

## 2021-01-30 DIAGNOSIS — D638 Anemia in other chronic diseases classified elsewhere: Secondary | ICD-10-CM | POA: Diagnosis present

## 2021-01-30 DIAGNOSIS — K5901 Slow transit constipation: Secondary | ICD-10-CM | POA: Diagnosis not present

## 2021-01-30 DIAGNOSIS — D649 Anemia, unspecified: Secondary | ICD-10-CM | POA: Diagnosis present

## 2021-01-30 DIAGNOSIS — M79673 Pain in unspecified foot: Secondary | ICD-10-CM | POA: Diagnosis not present

## 2021-01-30 DIAGNOSIS — E119 Type 2 diabetes mellitus without complications: Secondary | ICD-10-CM

## 2021-01-30 DIAGNOSIS — I70262 Atherosclerosis of native arteries of extremities with gangrene, left leg: Secondary | ICD-10-CM | POA: Diagnosis not present

## 2021-01-30 DIAGNOSIS — Z833 Family history of diabetes mellitus: Secondary | ICD-10-CM | POA: Diagnosis not present

## 2021-01-30 DIAGNOSIS — E1169 Type 2 diabetes mellitus with other specified complication: Secondary | ICD-10-CM | POA: Diagnosis not present

## 2021-01-30 DIAGNOSIS — E785 Hyperlipidemia, unspecified: Secondary | ICD-10-CM | POA: Diagnosis present

## 2021-01-30 DIAGNOSIS — I480 Paroxysmal atrial fibrillation: Secondary | ICD-10-CM | POA: Diagnosis present

## 2021-01-30 DIAGNOSIS — D72829 Elevated white blood cell count, unspecified: Secondary | ICD-10-CM | POA: Diagnosis not present

## 2021-01-30 DIAGNOSIS — M86172 Other acute osteomyelitis, left ankle and foot: Secondary | ICD-10-CM | POA: Diagnosis not present

## 2021-01-30 DIAGNOSIS — I5032 Chronic diastolic (congestive) heart failure: Secondary | ICD-10-CM | POA: Diagnosis present

## 2021-01-30 DIAGNOSIS — I739 Peripheral vascular disease, unspecified: Secondary | ICD-10-CM | POA: Diagnosis present

## 2021-01-30 DIAGNOSIS — R7309 Other abnormal glucose: Secondary | ICD-10-CM | POA: Diagnosis not present

## 2021-01-30 DIAGNOSIS — L97529 Non-pressure chronic ulcer of other part of left foot with unspecified severity: Secondary | ICD-10-CM | POA: Diagnosis present

## 2021-01-30 DIAGNOSIS — I350 Nonrheumatic aortic (valve) stenosis: Secondary | ICD-10-CM | POA: Diagnosis present

## 2021-01-30 DIAGNOSIS — Z7984 Long term (current) use of oral hypoglycemic drugs: Secondary | ICD-10-CM | POA: Diagnosis not present

## 2021-01-30 DIAGNOSIS — Z89511 Acquired absence of right leg below knee: Secondary | ICD-10-CM | POA: Diagnosis not present

## 2021-01-30 DIAGNOSIS — M86179 Other acute osteomyelitis, unspecified ankle and foot: Secondary | ICD-10-CM | POA: Diagnosis not present

## 2021-01-30 DIAGNOSIS — E1122 Type 2 diabetes mellitus with diabetic chronic kidney disease: Secondary | ICD-10-CM | POA: Diagnosis present

## 2021-01-30 DIAGNOSIS — Z794 Long term (current) use of insulin: Secondary | ICD-10-CM | POA: Diagnosis not present

## 2021-01-30 DIAGNOSIS — I152 Hypertension secondary to endocrine disorders: Secondary | ICD-10-CM | POA: Diagnosis present

## 2021-01-30 DIAGNOSIS — I071 Rheumatic tricuspid insufficiency: Secondary | ICD-10-CM | POA: Diagnosis present

## 2021-01-30 DIAGNOSIS — Z89512 Acquired absence of left leg below knee: Secondary | ICD-10-CM | POA: Diagnosis not present

## 2021-01-30 DIAGNOSIS — L97929 Non-pressure chronic ulcer of unspecified part of left lower leg with unspecified severity: Secondary | ICD-10-CM | POA: Diagnosis not present

## 2021-01-30 DIAGNOSIS — N1832 Chronic kidney disease, stage 3b: Secondary | ICD-10-CM | POA: Diagnosis present

## 2021-01-30 DIAGNOSIS — N182 Chronic kidney disease, stage 2 (mild): Secondary | ICD-10-CM | POA: Diagnosis not present

## 2021-01-30 DIAGNOSIS — I96 Gangrene, not elsewhere classified: Secondary | ICD-10-CM | POA: Diagnosis not present

## 2021-01-30 DIAGNOSIS — I10 Essential (primary) hypertension: Secondary | ICD-10-CM | POA: Diagnosis not present

## 2021-01-30 DIAGNOSIS — M869 Osteomyelitis, unspecified: Secondary | ICD-10-CM | POA: Diagnosis not present

## 2021-01-30 DIAGNOSIS — E1159 Type 2 diabetes mellitus with other circulatory complications: Secondary | ICD-10-CM | POA: Diagnosis present

## 2021-01-30 LAB — CBC WITH DIFFERENTIAL/PLATELET
Abs Immature Granulocytes: 0 10*3/uL (ref 0.00–0.07)
Basophils Absolute: 0.2 10*3/uL — ABNORMAL HIGH (ref 0.0–0.1)
Basophils Relative: 1 %
Eosinophils Absolute: 0.4 10*3/uL (ref 0.0–0.5)
Eosinophils Relative: 2 %
HCT: 30.3 % — ABNORMAL LOW (ref 39.0–52.0)
Hemoglobin: 10 g/dL — ABNORMAL LOW (ref 13.0–17.0)
Lymphocytes Relative: 26 %
Lymphs Abs: 5.1 10*3/uL — ABNORMAL HIGH (ref 0.7–4.0)
MCH: 30.3 pg (ref 26.0–34.0)
MCHC: 33 g/dL (ref 30.0–36.0)
MCV: 91.8 fL (ref 80.0–100.0)
Monocytes Absolute: 2 10*3/uL — ABNORMAL HIGH (ref 0.1–1.0)
Monocytes Relative: 10 %
Neutro Abs: 12 10*3/uL — ABNORMAL HIGH (ref 1.7–7.7)
Neutrophils Relative %: 61 %
Platelets: 352 10*3/uL (ref 150–400)
RBC: 3.3 MIL/uL — ABNORMAL LOW (ref 4.22–5.81)
RDW: 12.7 % (ref 11.5–15.5)
WBC: 19.7 10*3/uL — ABNORMAL HIGH (ref 4.0–10.5)
nRBC: 0 % (ref 0.0–0.2)
nRBC: 0 /100 WBC

## 2021-01-30 LAB — COMPREHENSIVE METABOLIC PANEL
ALT: 21 U/L (ref 0–44)
AST: 31 U/L (ref 15–41)
Albumin: 3.1 g/dL — ABNORMAL LOW (ref 3.5–5.0)
Alkaline Phosphatase: 62 U/L (ref 38–126)
Anion gap: 11 (ref 5–15)
BUN: 20 mg/dL (ref 8–23)
CO2: 21 mmol/L — ABNORMAL LOW (ref 22–32)
Calcium: 9.3 mg/dL (ref 8.9–10.3)
Chloride: 101 mmol/L (ref 98–111)
Creatinine, Ser: 1.52 mg/dL — ABNORMAL HIGH (ref 0.61–1.24)
GFR, Estimated: 48 mL/min — ABNORMAL LOW (ref >60)
Glucose, Bld: 145 mg/dL — ABNORMAL HIGH (ref 70–99)
Potassium: 3.9 mmol/L (ref 3.5–5.1)
Sodium: 133 mmol/L — ABNORMAL LOW (ref 135–145)
Total Bilirubin: 1.1 mg/dL (ref 0.3–1.2)
Total Protein: 7.8 g/dL (ref 6.5–8.1)

## 2021-01-30 LAB — SEDIMENTATION RATE: Sed Rate: 120 mm/hr — ABNORMAL HIGH (ref 0–16)

## 2021-01-30 LAB — GLUCOSE, CAPILLARY
Glucose-Capillary: 140 mg/dL — ABNORMAL HIGH (ref 70–99)
Glucose-Capillary: 68 mg/dL — ABNORMAL LOW (ref 70–99)

## 2021-01-30 LAB — LACTIC ACID, PLASMA: Lactic Acid, Venous: 1.6 mmol/L (ref 0.5–1.9)

## 2021-01-30 LAB — C-REACTIVE PROTEIN: CRP: 14.5 mg/dL — ABNORMAL HIGH (ref <1.0)

## 2021-01-30 IMAGING — CR DG TOE GREAT 2+V*L*
3 series · 3 of 3 positions shown · non-contrast
Comparison: [DATE] left foot radiographs

CLINICAL DATA: Recent osteomyelitis, worsening wound for 1 week in
the plantar left first toe

EXAM:
LEFT GREAT TOE

[toe ap]
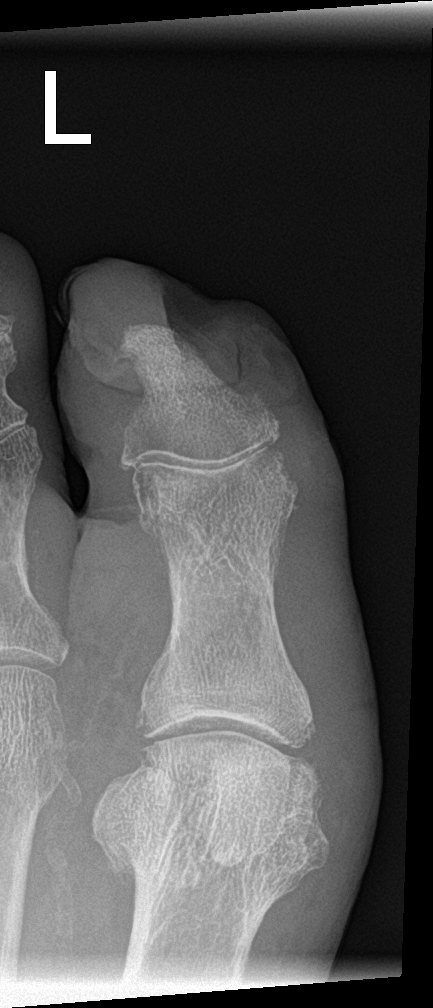

[toe obl]
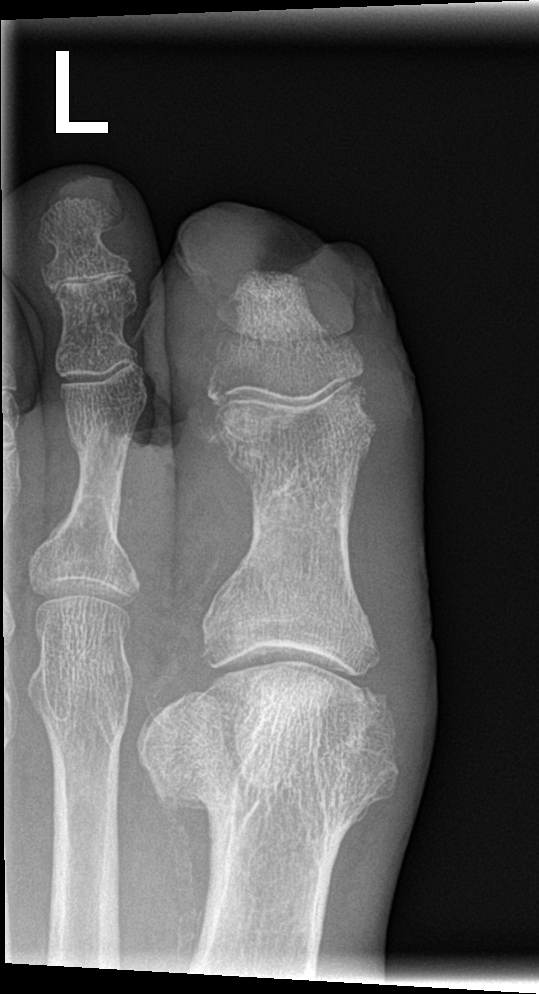

[toe lat]
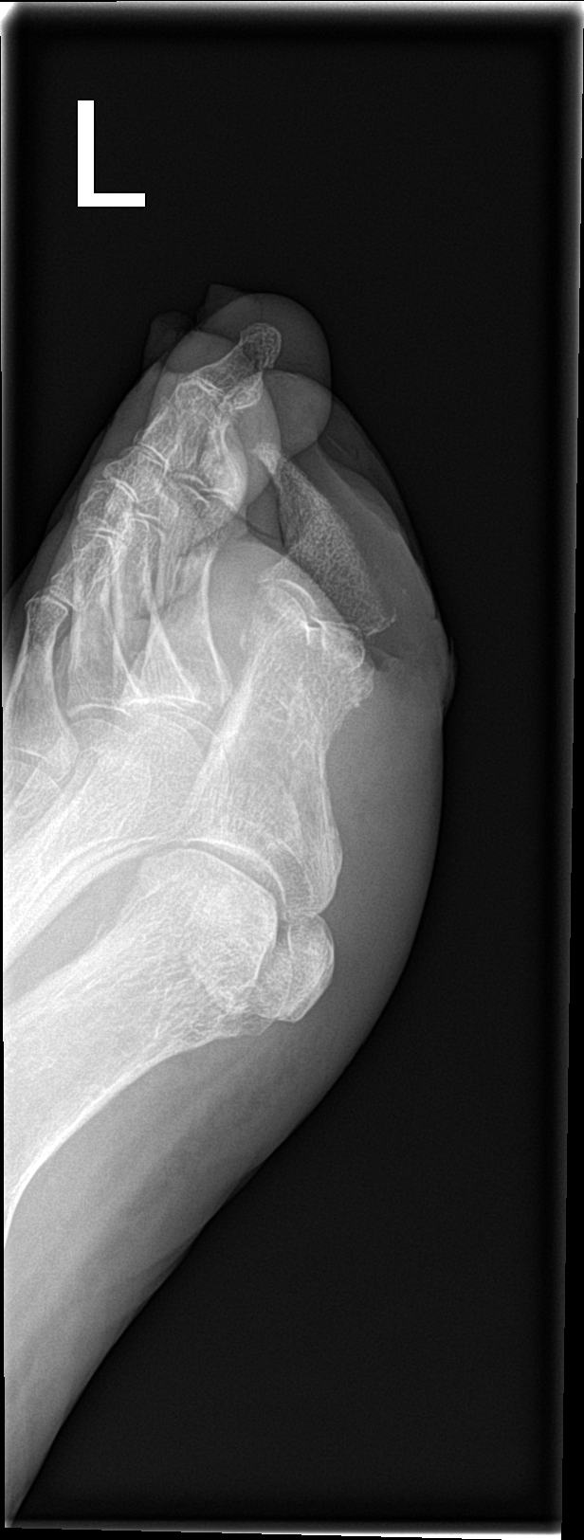

[3 of 3 positions shown; findings below may reference images not displayed]

FINDINGS: Generalized soft tissue swelling. Soft tissue defect in
plantar/medial distal left first toe. Loss of cortex compatible with
erosive change throughout the medial/plantar aspect of the distal
phalanx left first toe, similar to slightly worsened. New mild
erosive changes in the medial distal aspect of the proximal phalanx
in the left first toe. No focal osseous lesions. No fracture. No
dislocation. No radiopaque foreign bodies.
IMPRESSION: Generalized soft tissue swelling. Soft tissue defect in the
plantar/medial distal left first toe. Loss of cortex compatible with
erosive change throughout the medial/plantar aspect of the distal
phalanx left first toe, similar to slightly worsened. New mild
erosive changes in the medial distal aspect of the proximal phalanx
in the left first toe. These findings are compatible with
progression of acute osteomyelitis.

## 2021-01-30 MED ORDER — SODIUM CHLORIDE 0.9 % IV SOLN
250.0000 mL | INTRAVENOUS | Status: DC | PRN
  Administered 2021-01-31: 250 mL via INTRAVENOUS

## 2021-01-30 MED ORDER — INSULIN ASPART 100 UNIT/ML IJ SOLN
0.0000 [IU] | Freq: Three times a day (TID) | INTRAMUSCULAR | Status: DC
  Administered 2021-01-31: 1 [IU] via SUBCUTANEOUS
  Administered 2021-01-31 – 2021-02-02 (×3): 2 [IU] via SUBCUTANEOUS
  Administered 2021-02-02: 1 [IU] via SUBCUTANEOUS
  Administered 2021-02-02 – 2021-02-03 (×2): 2 [IU] via SUBCUTANEOUS
  Administered 2021-02-04: 1 [IU] via SUBCUTANEOUS
  Administered 2021-02-04 (×2): 2 [IU] via SUBCUTANEOUS

## 2021-01-30 MED ORDER — VANCOMYCIN HCL 1750 MG/350ML IV SOLN
1750.0000 mg | INTRAVENOUS | Status: DC
  Administered 2021-01-30 – 2021-02-04 (×6): 1750 mg via INTRAVENOUS
  Filled 2021-01-30 (×6): qty 350

## 2021-01-30 MED ORDER — ONE-A-DAY MENS 50+ ADVANTAGE PO TABS: 1.0000 | ORAL_TABLET | Freq: Every day | ORAL | Status: DC

## 2021-01-30 MED ORDER — SODIUM CHLORIDE 0.9 % IV SOLN
2.0000 g | Freq: Once | INTRAVENOUS | Status: AC
  Administered 2021-01-30: 2 g via INTRAVENOUS

## 2021-01-30 MED ORDER — SODIUM CHLORIDE 0.9 % IV BOLUS
500.0000 mL | Freq: Once | INTRAVENOUS | Status: AC
  Administered 2021-01-30: 500 mL via INTRAVENOUS

## 2021-01-30 MED ORDER — SODIUM CHLORIDE 0.9 % IV SOLN
2.0000 g | INTRAVENOUS | Status: AC
Start: 2021-01-31 — End: 2021-02-04
  Administered 2021-01-31 – 2021-02-04 (×5): 2 g via INTRAVENOUS
  Filled 2021-01-30 (×7): qty 20

## 2021-01-30 MED ORDER — KETOROLAC TROMETHAMINE 15 MG/ML IJ SOLN
15.0000 mg | Freq: Once | INTRAMUSCULAR | Status: AC
  Administered 2021-01-30: 15 mg via INTRAVENOUS
  Filled 2021-01-30: qty 1

## 2021-01-30 MED ORDER — ADULT MULTIVITAMIN W/MINERALS CH
1.0000 | ORAL_TABLET | Freq: Every day | ORAL | Status: DC
  Administered 2021-01-31 – 2021-02-04 (×5): 1 via ORAL
  Filled 2021-01-30 (×6): qty 1

## 2021-01-30 MED ORDER — ATORVASTATIN CALCIUM 10 MG PO TABS
10.0000 mg | ORAL_TABLET | Freq: Every day | ORAL | Status: DC
  Administered 2021-01-31 – 2021-02-04 (×5): 10 mg via ORAL
  Filled 2021-01-30 (×6): qty 1

## 2021-01-30 MED ORDER — SODIUM CHLORIDE 0.9% FLUSH: 3.0000 mL | INTRAVENOUS | Status: DC | PRN

## 2021-01-30 MED ORDER — SODIUM CHLORIDE 0.9% FLUSH
3.0000 mL | Freq: Two times a day (BID) | INTRAVENOUS | Status: DC
  Administered 2021-01-30 – 2021-02-04 (×6): 3 mL via INTRAVENOUS

## 2021-01-30 MED ORDER — AMLODIPINE BESYLATE 10 MG PO TABS
10.0000 mg | ORAL_TABLET | Freq: Every day | ORAL | Status: DC
  Administered 2021-01-31 – 2021-02-04 (×5): 10 mg via ORAL
  Filled 2021-01-30 (×6): qty 1

## 2021-01-30 MED ORDER — HYDROCODONE-ACETAMINOPHEN 5-325 MG PO TABS
1.0000 | ORAL_TABLET | ORAL | Status: DC | PRN
  Administered 2021-01-30 – 2021-02-03 (×15): 2 via ORAL
  Filled 2021-01-30 (×15): qty 2

## 2021-01-30 MED ORDER — ACETAMINOPHEN 325 MG PO TABS: 650.0000 mg | ORAL_TABLET | Freq: Four times a day (QID) | ORAL | Status: DC | PRN

## 2021-01-30 MED ORDER — ACETAMINOPHEN 650 MG RE SUPP: 650.0000 mg | Freq: Four times a day (QID) | RECTAL | Status: DC | PRN

## 2021-01-30 MED ORDER — HEPARIN SODIUM (PORCINE) 5000 UNIT/ML IJ SOLN
5000.0000 [IU] | Freq: Three times a day (TID) | INTRAMUSCULAR | Status: DC
  Administered 2021-01-30 – 2021-02-02 (×10): 5000 [IU] via SUBCUTANEOUS
  Filled 2021-01-30 (×11): qty 1

## 2021-01-30 NOTE — H&P (Addendum)
History and Physical    Joseph Guiana Salsberry WLN:[id number] DOB: 05-Aug-1945 DOA: 01/30/2021  PCP: Charlott Rakes, MD Consultants:  cardiology: Dr. Harrington Challenger, podiatry: Dr. Sherryle Lis  Patient coming from:  Home - lives alone  Chief Complaint: "I am ready for surgery" known left big toe osteomyelitis   HPI: Joseph [name] is a 75 y.o. male with medical history significant of  IDDM, HTN, CKD stage IIIb, PAD, paroxysmal atrial fibrillation on no anticoagulation, chronic left foot wound x 2 months and right transtibial amputation presenting to ED with known osteomyelitis of his left big toe.  He was just admitted to  hospital 01/26/2021 for osteomyelitis of his left big toe. He underwent an arteriogram 8/31 that showed no options for revascularization. They wanted to do surgery at this time, but he wanted to go home to get things in order so he was discharged home.  He came in today as he got his things in order at home and is ready to have the surgery.  Per progress note by Dr. Sharol Given on 9-1 he called in prescription for doxycycline and Vicodin and recommended he follow-up in the office in 1 week and anticipate surgery in 2 weeks.  Mr. Tubbs tells me he got his things in order faster than expected so came into the emergency room.  He denies any specific event that happened to make him come back today.  He denies any fever, chills, drainage or foul smell from his foot, or change in pain in his left big toe.   He denies any chest pain, palpitations, stomach pain, N/V/D, headache, dizziness, leg swelling, dysuria.   ED Course: vitals: temp: 99.1, pressure 137/76 heart rate 87, respiratory rate 20, oxygen 100% on room air. Pertinent labs: WBC 19.7, hemoglobin 10 (9.5-11.9), creatinine 1.52 (E baseline 1.26-1.67), lactic acid within normal limits, ESR: 120, CRP 14.5 Left toe xray: Findings compatible with progression of acute osteomyelitis.  Started on Vancomycin and Rocephin.  Given 15 mg of Toradol 500 mL bolus. orthopedic  surgery consulted and we were asked to admit.   Review of Systems: As per HPI; otherwise review of systems reviewed and negative.   Ambulatory Status:  Ambulates with prosthesis   Past Medical History:  Diagnosis Date   Cellulitis of right lower extremity    Diabetes mellitus type 2 in nonobese [facility])    Gangrene of right foot ([place])    Hypertension    Open wound of right foot     Past Surgical History:  Procedure Laterality Date   ABDOMINAL AORTOGRAM W/LOWER EXTREMITY Left 01/27/2021   Procedure: ABDOMINAL AORTOGRAM W/LOWER EXTREMITY;  Surgeon: Cherre Robins, MD;  Location: [place] [facility];  Service: Cardiovascular;  Laterality: Left;   AMPUTATION Right 11/16/2018   Procedure: RIGHT BELOW KNEE AMPUTATION;  Surgeon: Newt Minion, MD;  Location: [place];  Service: Orthopedics;  Laterality: Right;   HAND SURGERY      Social History   Socioeconomic History   Marital status: Divorced    Spouse name: Not on file   Number of children: Not on file   Years of education: Not on file   Highest education level: Not on file  Occupational History   Occupation: retired     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  Tobacco Use   Smoking status: Never   Smokeless tobacco: Never  Substance and Sexual Activity   Alcohol use: Yes   Drug use: No   Sexual activity: Not on file  Other Topics  Concern   Not on file  Social History Narrative   Lives alone, 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   On fixed Social security income   Has a daughter and son does not speak with them every day   Most reliable family members reported to live in [place]   Divorced but remains friends with his ex wife, Lucina Mellow   Has advance directives but has not as 11/03/20 [facility] a copy to [facility]   Goes to church      Social Determinants of Health   Financial Resource Strain: Medium Risk   Difficulty of Paying Living Expenses: Somewhat hard  Food Insecurity: Landscape architect Present   Worried  About Charity fundraiser in the Last Year: Sometimes true   Arboriculturist in the Last Year: Sometimes true  Transportation Needs: No Transportation Needs   Lack of Transportation (Medical): No   Lack of Transportation (Non-Medical): No  Physical Activity: Not on file  Stress: No Stress Concern Present   Feeling of Stress : Only a little  Social Connections: Moderately Isolated   Frequency of Communication with Friends and Family: Once a week   Frequency of Social Gatherings with Friends and Family: Once a week   Attends Religious Services: 1 to 4 times per year   Active Member of [employer] or Organizations: Yes   Attends Archivist Meetings: 1 to 4 times per year   Marital Status: Never married  Human resources officer Violence: Not At Risk   Fear of Current or Ex-Partner: No   Emotionally Abused: No   Physically Abused: No   Sexually Abused: No    No Known Allergies  Family History  Problem Relation Age of Onset   Diabetes Father    Cancer Neg Hx    CAD Neg Hx     Prior to Admission medications   Medication Sig Start Date End Date Taking? Authorizing Provider  Accu-Chek FastClix Lancets MISC Use to check blood sugars three times per day 05/17/19   Fulp, Cammie, MD  amLODipine (NORVASC) 10 MG tablet Take 1 tablet (10 mg total) by mouth daily. 12/02/20   Argentina Donovan, PA-C  amoxicillin-clavulanate (AUGMENTIN) 875-125 MG tablet Take 1 tablet by mouth 2 (two) times daily. 01/28/21 02/27/21  Patrecia Pour, MD  aspirin EC 81 MG tablet Take 81 mg by mouth daily. Swallow whole.    [provider]  Aspirin-Salicylamide-Caffeine (BC HEADACHE POWDER PO) Take 1-3 packets by mouth as needed (for pain).    [provider]  atorvastatin (LIPITOR) 10 MG tablet Take 1 tablet (10 mg total) by mouth daily. In the evening Patient not taking: Reported on 01/26/2021 12/02/20   Argentina Donovan, PA-C  Blood Glucose Monitoring Suppl (ACCU-CHEK GUIDE ME) w/Device KIT 1 kit by Does  not apply route 3 (three) times daily. To check blood sugars 05/17/19   Fulp, Cammie, MD  cadexomer iodine (IODOSORB) 0.9 % gel Apply 1 application topically 3 (three) times a week. Cleanse wound with wound wash solution.  Apply small dab of iodorsorb and cover with a dry sterile dressing daily Patient not taking: Reported on 01/26/2021 01/15/21   Criselda Peaches, DPM  diclofenac sodium (VOLTAREN) 1 % GEL Apply 2 g topically 3 (three) times daily. Patient not taking: Reported on 01/26/2021 12/03/18   Angiulli, Lavon Paganini, PA-C  doxycycline (VIBRA-TABS) 100 MG tablet Take 1 tablet (100 mg total) by mouth 2 (two) times daily. 01/28/21   Sharol Given,  Illene Regulus, MD  glipiZIDE (GLUCOTROL) 10 MG tablet TAKE 1 TABLET (10 MG TOTAL) BY MOUTH 2 (TWO) TIMES DAILY BEFORE A MEAL. Patient taking differently: Take 10 mg by mouth 2 (two) times daily before a meal. 12/02/20 12/02/21  Argentina Donovan, PA-C  glucose blood (ACCU-CHEK GUIDE) test strip Use as instructed 05/17/19   Fulp, Cammie, MD  hydrochlorothiazide (MICROZIDE) 12.5 MG capsule Take 1 capsule (12.5 mg total) by mouth daily. Patient not taking: Reported on 01/26/2021 12/02/20   Argentina Donovan, PA-C  HYDROcodone-acetaminophen (NORCO/VICODIN) 5-325 MG tablet Take 1 tablet by mouth every 4 (four) hours as needed. 01/28/21   Newt Minion, MD  Insulin Glargine [facility] KWIKPEN) 100 UNIT/ML Inject 15 Units into the skin daily. 12/30/20   Argentina Donovan, PA-C  Insulin Pen Needle 31G X 5 MM MISC use one pen needle once daily. 12/30/20   Argentina Donovan, PA-C  Lancets Misc. (ACCU-CHEK FASTCLIX LANCET) KIT Use when checking blood sugars three times daily 05/17/19   Fulp, Cammie, MD  Multiple Vitamins-Minerals (ONE-A-DAY MENS 50+ ADVANTAGE) TABS Take 1 tablet by mouth daily with breakfast.    [provider]  mupirocin ointment (BACTROBAN) 2 % Apply 1 application topically 2 (two) times daily. Patient not taking: Reported on 01/26/2021 12/02/20   Argentina Donovan, PA-C   polyethylene glycol (MIRALAX / GLYCOLAX) 17 g packet Take 17 g by mouth daily as needed for mild constipation. Patient not taking: Reported on 01/26/2021 11/20/18   Georgette Shell, MD    Physical Exam: Vitals:   01/30/21 1230 01/30/21 1245 01/30/21 1300 01/30/21 1315  BP: 127/74 124/74 127/78 (!) 148/78  Pulse: 63 62 63 64  Resp:    12  Temp:      TempSrc:      SpO2: 99% 99% 97% 99%     General:  Appears calm and comfortable and is in NAD Eyes:  PERRL, EOMI, normal lids, iris ENT:  grossly normal hearing, lips & tongue, mmm; appropriate dentition, missing some teeth.  Neck:  no LAD, masses or thyromegaly; no carotid bruits Cardiovascular:  RRR, loud, harsh systolic murmur. LLE edema with some fluid bubbles. Chronic stasis changes.  Respiratory:   CTA bilaterally with no wheezes/rales/rhonchi.  Normal respiratory effort. Abdomen:  soft, NT, ND, NABS Back:   normal alignment, no CVAT Skin:  see pictures below of left big toe.  Musculoskeletal:  grossly normal tone and strength of BUE. Right transtibial amputation. LLE with normal tone/ROM. Left great toe with open and foul smelling wound on dorsal side. Has drainage and some maceration in between first and second toe. See pics below.  Lower extremity: see above.  Non palpable pedal pulses of left foot.  Psychiatric:  grossly normal mood and affect, speech fluent and appropriate, AOx3 Neurologic:  CN 2-12 grossly intact, moves all extremities in coordinated fashion, sensation intact       Radiological Exams on Admission: Independently reviewed - see discussion in A/P where applicable  DG Toe Great Left  Result Date: 01/30/2021 CLINICAL DATA:  Recent osteomyelitis, worsening wound for 1 week in the plantar left first toe EXAM: LEFT GREAT TOE COMPARISON:  01/26/2021 left foot radiographs FINDINGS: Generalized soft tissue swelling. Soft tissue defect in plantar/medial distal left first toe. Loss of cortex compatible with  erosive change throughout the medial/plantar aspect of the distal phalanx left first toe, similar to slightly worsened. New mild erosive changes in the medial distal aspect of the proximal phalanx in the  left first toe. No focal osseous lesions. No fracture. No dislocation. No radiopaque foreign bodies. IMPRESSION: Generalized soft tissue swelling. Soft tissue defect in the plantar/medial distal left first toe. Loss of cortex compatible with erosive change throughout the medial/plantar aspect of the distal phalanx left first toe, similar to slightly worsened. New mild erosive changes in the medial distal aspect of the proximal phalanx in the left first toe. These findings are compatible with progression of acute osteomyelitis. Electronically Signed   By: Ilona Sorrel M.D.   On: 01/30/2021 06:36    EKG: pending    Labs on Admission: I have personally reviewed the available labs and imaging studies at the time of the admission.  Pertinent labs:  WBC 19.7  hemoglobin 10 (9.5-11.9),  creatinine 1.52 (E baseline 1.26-1.67),  lactic acid within normal limits,  ESR: 120  CRP 14.5    Assessment/Plan Principal Problem:   Osteomyelitis of great toe of left foot [facility]) -75 year old male with known osteomyelitis of left great toe secondary to PAD with no options for revascularization presenting for amputation.  He has a white count to 19.7 elevated inflammatory markers; however, no fevers,  normal lactic acid and has no other criteria to meet SIRS criteria. -Was just admitted on 8-30 and discharged on 9-1. Discharged on doxycycline.  -Arteriogram performed 8/31 showed no options for revascularization. -01-28-2021 per Dr. Sharol Given discussed with patient the best option would be to proceed with a transtibial amputation on the left -Orthopedic surgery has been consulted and will follow up on recommendations -Wound culture on 8/15 showed Klebsiella and Proteus both sensitive to Rocephin.  Continue Vancomycin and  Rocephin -Wound care consult -continue norco for pain control as needed   Active Problems: Leukocytosis -Secondary to chronic infection/osteomyelitis.  -No SIRS criteria met -Antibiotics started for osteomyelitis: Rocephin and vancomycin    Anemia -Checking iron studies -Iron deficiency versus anemia of chronic disease with chronic wound infection   Hypertension associated with diabetes ([place]) Continue home medications of Norvasc 10 mg daily  PAD -Severe in left lower extremity on arteriogram 01/27/2021 -Hold aspirin for surgery. Restart statin as he states he has not been taking this.   CKD stage IIIb -At his baseline -Monitor intake and output -monitor daily       DM (diabetes mellitus) ([place]) -A1c in August 2022 was 9.4 -Hold glipizide -hold home long acting insulin. Had BS of 68 and states he has not been using due to low BS at home. May need to start with lower amount or may benefit from other oral therapies on discharge vs. GLP-1 inhibitor.  - sliding scale insulin and Accu-Cheks    Paroxysmal atrial fibrillation ([facility]) -Not anticoagulated due to history of GI bleeds and not wanting work-up. -Was on amiodarone but appears to no longer be on this. cannot find note from cardiology that this was stopped. He tells me a rehab place stopped it after his right amputation in 2021.   -Continue telemetry. -needs f/u with Dr. Harrington Challenger outpatient.     Chronic diastolic congestive heart failure [facility]) -Patient appears euvolemic -Echo June 2020 showed moderate hypokinesis of the left ventricular, entire anterior septal wall.  EF: 50 to 55%.  Left ventricular diastolic Doppler parameters consistent with pseudonormalization.  Moderate severe tricuspid valve regurgitation.  Mild stenosis of aortic valve. -Monitor intake and output    Hyperlipidemia  -restart Lipitor 10 mg daily, has not been taking.  -Lipid panel checked August 2022 and was to goal   There is no  height or weight on file  to calculate BMI.   Level of care: Telemetry Medical DVT prophylaxis:  heparin [place] Code Status:  Full - confirmed with patient Family Communication: None present Disposition Plan:  The patient is from: home  Dispo: per rounding team/specialists Requires inpatient hospitalization and is at significant risk of neurological worsening, requires constant monitoring, assessment and MDM with specialists.   Consults called: orthopedic surgery  Admission status:  inpatient   Dragon dictation used in completing this note.   Orma Flaming MD [facility]   How to contact the [facility] Attending or Consulting provider Tina or covering provider during after hours [place], for this patient?  Check the care team in [facility] and look for a) attending/consulting [facility] provider listed and b) the [facility] team listed Log into [email or web address] and use [place]'s universal password to access. If you do not have the password, please contact the hospital operator. Locate the [facility] provider you are looking for under [facility] and page to a number that you can be directly reached. If you still have difficulty reaching the provider, please page the [facility] (Director on Call) for the Hospitalists listed on amion for assistance.   01/30/2021, 2:14 PM

## 2021-01-30 NOTE — ED Provider Notes (Signed)
[facility] Provider Note   CSN: [id number] Arrival date & time: 01/30/21  0530     History Chief Complaint  Patient presents with   Foot Pain    Joseph [name] is a 75 y.o. male.  Pt is 75 yo male presenting for foot pain. Patient sent in by orthopedic surgeon Dr. Sharol Given for gangrenous ulcer left great toe status post arterial intervention for the left lower extremity for transtibial amputation on the left similar to the right. Please see attached photos. Pt admits to minimal pain at this time. Denies fevers, chills, nausea, vomiting.   The history is provided by the patient. No language interpreter was used.  Foot Pain Pertinent negatives include no chest pain, no abdominal pain and no shortness of breath.      Past Medical History:  Diagnosis Date   Cellulitis of right lower extremity    Diabetes mellitus type 2 in nonobese [facility])    Gangrene of right foot ([place])    Hypertension    Open wound of right foot     Patient Active Problem List   Diagnosis Date Noted   Osteomyelitis ([place]) 01/26/2021   Osteomyelitis of great toe of left foot (HCC)    Postoperative pain    AKI (acute kidney injury) ([place])    Acute blood loss anemia    Chronic diastolic congestive heart failure (HCC)    Diabetic peripheral neuropathy (HCC)    CKD (chronic kidney disease), stage II    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)    Leukocytosis    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   Atrial fibrillation (HCC)    Diabetic polyneuropathy associated with type 2 diabetes mellitus (HCC)    PVD (peripheral vascular disease) (HCC)    Critical lower limb ischemia (HCC)    Sepsis with acute renal failure ([place]) 11/13/2018   Acute renal failure superimposed on stage 2 chronic kidney disease ([place])    'light-for-dates' infant with signs of fetal malnutrition 10/17/2018   DM (diabetes mellitus) ([place]) 07/02/2012   Hypertension associated with diabetes  ([place]) 07/02/2012    Past Surgical History:  Procedure Laterality Date   ABDOMINAL AORTOGRAM W/LOWER EXTREMITY Left 01/27/2021   Procedure: ABDOMINAL AORTOGRAM W/LOWER EXTREMITY;  Surgeon: Cherre Robins, MD;  Location: [facility];  Service: Cardiovascular;  Laterality: Left;   AMPUTATION Right 11/16/2018   Procedure: RIGHT BELOW KNEE AMPUTATION;  Surgeon: Newt Minion, MD;  Location: [facility];  Service: Orthopedics;  Laterality: Right;   HAND SURGERY         Family History  Problem Relation Age of Onset   Diabetes Father    Cancer Neg Hx    CAD Neg Hx     Social History   Tobacco Use   Smoking status: Never   Smokeless tobacco: Never  Substance Use Topics   Alcohol use: Yes   Drug use: No    Home Medications Prior to Admission medications   Medication Sig Start Date End Date Taking? Authorizing Provider  Accu-Chek FastClix Lancets MISC Use to check blood sugars three times per day 05/17/19   Fulp, Cammie, MD  amLODipine (NORVASC) 10 MG tablet Take 1 tablet (10 mg total) by mouth daily. 12/02/20   Argentina Donovan, PA-C  amoxicillin-clavulanate (AUGMENTIN) 875-125 MG tablet Take 1 tablet by mouth 2 (two) times daily. 01/28/21 02/27/21  Patrecia Pour, MD  aspirin EC 81 MG tablet Take 81 mg by mouth daily. Swallow whole.  [provider]  Aspirin-Salicylamide-Caffeine (BC HEADACHE POWDER PO) Take 1-3 packets by mouth as needed (for pain).    [provider]  atorvastatin (LIPITOR) 10 MG tablet Take 1 tablet (10 mg total) by mouth daily. In the evening Patient not taking: Reported on 01/26/2021 12/02/20   Argentina Donovan, PA-C  Blood Glucose Monitoring Suppl (ACCU-CHEK GUIDE ME) w/Device KIT 1 kit by Does not apply route 3 (three) times daily. To check blood sugars 05/17/19   Fulp, Cammie, MD  cadexomer iodine (IODOSORB) 0.9 % gel Apply 1 application topically 3 (three) times a week. Cleanse wound with wound wash solution.  Apply small dab of iodorsorb and  cover with a dry sterile dressing daily Patient not taking: Reported on 01/26/2021 01/15/21   Criselda Peaches, DPM  diclofenac sodium (VOLTAREN) 1 % GEL Apply 2 g topically 3 (three) times daily. Patient not taking: Reported on 01/26/2021 12/03/18   Angiulli, Lavon Paganini, PA-C  doxycycline (VIBRA-TABS) 100 MG tablet Take 1 tablet (100 mg total) by mouth 2 (two) times daily. 01/28/21   Newt Minion, MD  glipiZIDE (GLUCOTROL) 10 MG tablet TAKE 1 TABLET (10 MG TOTAL) BY MOUTH 2 (TWO) TIMES DAILY BEFORE A MEAL. Patient taking differently: Take 10 mg by mouth 2 (two) times daily before a meal. 12/02/20 12/02/21  Argentina Donovan, PA-C  glucose blood (ACCU-CHEK GUIDE) test strip Use as instructed 05/17/19   Fulp, Cammie, MD  hydrochlorothiazide (MICROZIDE) 12.5 MG capsule Take 1 capsule (12.5 mg total) by mouth daily. Patient not taking: Reported on 01/26/2021 12/02/20   Argentina Donovan, PA-C  HYDROcodone-acetaminophen (NORCO/VICODIN) 5-325 MG tablet Take 1 tablet by mouth every 4 (four) hours as needed. 01/28/21   Newt Minion, MD  Insulin Glargine [facility] KWIKPEN) 100 UNIT/ML Inject 15 Units into the skin daily. 12/30/20   Argentina Donovan, PA-C  Insulin Pen Needle 31G X 5 MM MISC use one pen needle once daily. 12/30/20   Argentina Donovan, PA-C  Lancets Misc. (ACCU-CHEK FASTCLIX LANCET) KIT Use when checking blood sugars three times daily 05/17/19   Fulp, Cammie, MD  Multiple Vitamins-Minerals (ONE-A-DAY MENS 50+ ADVANTAGE) TABS Take 1 tablet by mouth daily with breakfast.    [provider]  mupirocin ointment (BACTROBAN) 2 % Apply 1 application topically 2 (two) times daily. Patient not taking: Reported on 01/26/2021 12/02/20   Argentina Donovan, PA-C  polyethylene glycol (MIRALAX / GLYCOLAX) 17 g packet Take 17 g by mouth daily as needed for mild constipation. Patient not taking: Reported on 01/26/2021 11/20/18   Georgette Shell, MD    Allergies    Patient has no known allergies.  Review of  Systems   Review of Systems  Constitutional:  Negative for chills and fever.  HENT:  Negative for ear pain and sore throat.   Eyes:  Negative for pain and visual disturbance.  Respiratory:  Negative for cough and shortness of breath.   Cardiovascular:  Negative for chest pain and palpitations.  Gastrointestinal:  Negative for abdominal pain and vomiting.  Genitourinary:  Negative for dysuria and hematuria.  Musculoskeletal:  Negative for arthralgias and back pain.  Skin:  Positive for wound. Negative for color change and rash.  Neurological:  Negative for seizures and syncope.  All other systems reviewed and are negative.  Physical Exam Updated Vital Signs BP 132/79 (BP Location: Left Arm)   Pulse 82   Temp 98.5 F (36.9 C)   Resp 16   SpO2  97%   Physical Exam Vitals and nursing note reviewed.  Constitutional:      Appearance: He is well-developed.  HENT:     Head: Normocephalic and atraumatic.  Eyes:     Conjunctiva/sclera: Conjunctivae normal.  Cardiovascular:     Rate and Rhythm: Normal rate and regular rhythm.     Heart sounds: No murmur heard. Pulmonary:     Effort: Pulmonary effort is normal. No respiratory distress.     Breath sounds: Normal breath sounds.  Abdominal:     Palpations: Abdomen is soft.     Tenderness: There is no abdominal tenderness.  Musculoskeletal:     Cervical back: Neck supple.       Legs:       Feet:  Skin:    General: Skin is warm and dry.  Neurological:     Mental Status: He is alert.       ED Results / Procedures / Treatments   Labs (all labs ordered are listed, but only abnormal results are displayed) Labs Reviewed  CBC WITH DIFFERENTIAL/PLATELET - Abnormal; Notable for the following components:      Result Value   WBC 19.7 (*)    RBC 3.30 (*)    Hemoglobin 10.0 (*)    HCT 30.3 (*)    Neutro Abs 12.0 (*)    Lymphs Abs 5.1 (*)    Monocytes Absolute 2.0 (*)    Basophils Absolute 0.2 (*)    All other components within  normal limits  COMPREHENSIVE METABOLIC PANEL - Abnormal; Notable for the following components:   Sodium 133 (*)    CO2 21 (*)    Glucose, Bld 145 (*)    Creatinine, Ser 1.52 (*)    Albumin 3.1 (*)    GFR, Estimated 48 (*)    All other components within normal limits  CULTURE, BLOOD (ROUTINE X 2)  CULTURE, BLOOD (ROUTINE X 2)  LACTIC ACID, PLASMA  SEDIMENTATION RATE  C-REACTIVE PROTEIN    EKG None  Radiology DG Toe Great Left  Result Date: 01/30/2021 CLINICAL DATA:  Recent osteomyelitis, worsening wound for 1 week in the plantar left first toe EXAM: LEFT GREAT TOE COMPARISON:  01/26/2021 left foot radiographs FINDINGS: Generalized soft tissue swelling. Soft tissue defect in plantar/medial distal left first toe. Loss of cortex compatible with erosive change throughout the medial/plantar aspect of the distal phalanx left first toe, similar to slightly worsened. New mild erosive changes in the medial distal aspect of the proximal phalanx in the left first toe. No focal osseous lesions. No fracture. No dislocation. No radiopaque foreign bodies. IMPRESSION: Generalized soft tissue swelling. Soft tissue defect in the plantar/medial distal left first toe. Loss of cortex compatible with erosive change throughout the medial/plantar aspect of the distal phalanx left first toe, similar to slightly worsened. New mild erosive changes in the medial distal aspect of the proximal phalanx in the left first toe. These findings are compatible with progression of acute osteomyelitis. Electronically Signed   By: Ilona Sorrel M.D.   On: 01/30/2021 06:36    Procedures Procedures   Medications Ordered in ED Medications  sodium chloride 0.9 % bolus 500 mL (has no administration in time range)  ketorolac (TORADOL) 15 MG/ML injection 15 mg (has no administration in time range)    ED Course  I have reviewed the triage vital signs and the nursing notes.  Pertinent labs & imaging results that were available  during my care of the patient were reviewed by me  and considered in my medical decision making (see chart for details).    MDM Rules/Calculators/A&P                         9:25 AM 75 yo male presenting for foot pain. Patient Aox3, no acute distress, afebrile, with stable vitals. No signs/symptoms of sepsis.    Pt seen on orthopedic surgeon Dr. Sharol Given on 01/28/2021 with recommendations as follows: Discussed with the patient I do not feel that an amputation of the great toe would heal due to the lack of circulation distally.  Feels best option would be to proceed with a transtibial amputation on the left similar to the right.     -Leukocytosis present -Imaging today demonstrates: Generalized soft tissue swelling. Soft tissue defect in the plantar/medial distal left first toe. Loss of cortex compatible with erosive change throughout the medial/plantar aspect of the distal phalanx left first toe, similar to slightly worsened. New mild erosive changes in the medial distal aspect of the proximal phalanx in the left first toe.  -These findings are compatible with progression of acute osteomyelitis. -Vanc and rocephin started.   -Orthopedics on consult and agreeable to see patient -Pt accepted by hospitalist Dr. [facility] Blocker.   Final Clinical Impression(s) / ED Diagnoses Final diagnoses:  Osteomyelitis of foot, unspecified laterality, unspecified type ([place])  Wet gangrene [facility])    Rx / DC Orders ED Discharge Orders     None        Lianne Cure, DO 16/10/96 1358

## 2021-01-30 NOTE — ED Notes (Signed)
Tele tracking in  

## 2021-01-30 NOTE — ED Notes (Signed)
MD at bedside assessing pt at this time.

## 2021-01-30 NOTE — Consult Note (Addendum)
WOC Nurse Consult Note: Reason for Consult:Dressing change guidance requested. Transtibial amputation scheduled for this week. Patient has failed all attempts at limb salvage. Wound type: PAD, infection Pressure Injury POA: N/A Measurement:N/A Wound ZMO:[id number] tissue, separation of tissue at web space between great toe and 2nd digit. Subcutaneous pooling of exudate. Drainage (amount, consistency, odor) Light yellow, small. Odor present is consistent with necrotic tissue Periwound: As described above.  See also photograph in the EMR. Dressing procedure/placement/frequency: I have provided Nursing with guidance for wound care to this foot until DOS using a daily cleanse followed by topping with a dry dressing and securing with conform bandaging/paper tape to prevent contamination and protect the digit. A sacral prophylactic foam bordered dressing is also provided for PI prevention while in house and a pressure redistribution chair cushion for when OOB in chair both pre and post op.  Patient may also take this home with him upon discharge.  WOC nursing team will not follow, but will remain available to this patient, the nursing and medical teams.  Please re-consult if needed. Thanks, Ladona Mow, MSN, RN, GNP, [place]  Pager# [phone]

## 2021-01-30 NOTE — ED Triage Notes (Signed)
Pt has osteo of the L great toe and has been on antibiotics, plan for amputation, infection seems to be getting worse.

## 2021-01-30 NOTE — ED Provider Notes (Signed)
Emergency Medicine Provider Triage Evaluation Note  Joseph [name] , a 75 y.o. male  was evaluated in triage.  Pt complains of worsening left great toe wound secondary for vascular insufficiency.  Recently admitted for osteomyelitis and discharged home with amoxicillin with plan for transtibial amputation in the future with Dr. Lajoyce Corners as no feasible vascular intervention options.  States wound seems to be getting bigger now with drainage.  Denies fever.  Review of Systems  Positive: Foot wound, drainage Negative: fever  Physical Exam  BP 137/76 (BP Location: Right Arm)   Pulse 87   Temp 99.1 F (37.3 C) (Oral)   Resp 20   SpO2 100%  Gen:   Awake, no distress   Resp:  Normal effort  MSK:   Moves extremities without difficulty  Other:  Wound noted to solar aspect of left great toe and appears to extend into webbed space between toes and somewhat into ball of foot, yellow purulent drainage present     Medical Decision Making  Medically screening exam initiated at 5:37 AM.  Appropriate orders placed.  Joseph Ardolino was informed that the remainder of the evaluation will be completed by another provider, this initial triage assessment does not replace that evaluation, and the importance of remaining in the ED until their evaluation is complete.  Will obtain toe films, labs, cultures.   Garlon Hatchet, PA-C 01/30/21 [id number]    Geoffery Lyons, MD 01/30/21 [phone]

## 2021-01-30 NOTE — Progress Notes (Signed)
Pharmacy Antibiotic Note  Joseph [name] is a 75 y.o. male admitted on 01/30/2021 with progression of osteomyletis of L-great toe .  Pharmacy has been consulted for Vancomycin dosing. Patient was on doxycycline with plan to have amputation in ~2 weeks from 9/1. Now presents with worsening infection and to escalate to IV Vancomycin.   WBC up 19.7, ANC 12. SCr up 1.52 from prior 1.26 last admit on 9/1. LA up at 1.6 from 0.8. Afebrile.   Plan: Restart Vancomycin at  1750 mg IV every 24 hours (SCr used 1.52, eAUC 507) Monitor renal function - if continues to rise, will need to dose based on levels Follow-up surgery plans and LOT If continue, will need to obtain vancomycin levels at [facility]     Temp (24hrs), Avg:98.8 F (37.1 C), Min:98.5 F (36.9 C), Max:99.1 F (37.3 C)  Recent Labs  Lab 01/26/21 0826 01/27/21 0331 01/28/21 0225 01/30/21 0556  WBC 17.2* 16.4* 15.4* 19.7*  CREATININE 1.54* 1.36* 1.26* 1.52*  LATICACIDVEN 0.8  --   --  1.6    Estimated Creatinine Clearance: 53.1 mL/min (A) (by C-G formula based on SCr of 1.52 mg/dL (H)).    No Known Allergies  Antimicrobials this admission: Vancomycin 8/31 >>9/1; 9/3 >> Doxycycline 9/1>>9/3  Dose adjustments this admission: N/a  Microbiology results: 9/3 BCx: sent  Thank you for allowing pharmacy to be a part of this patient's care.  Link Snuffer, PharmD, BCPS, BCCCP Clinical Pharmacist Please refer to [facility] for [facility] Pharmacy numbers 01/30/2021 9:30 AM

## 2021-01-31 ENCOUNTER — Encounter (HOSPITAL_COMMUNITY): Payer: Self-pay | Admitting: Family Medicine

## 2021-01-31 DIAGNOSIS — N182 Chronic kidney disease, stage 2 (mild): Secondary | ICD-10-CM

## 2021-01-31 DIAGNOSIS — I5032 Chronic diastolic (congestive) heart failure: Secondary | ICD-10-CM

## 2021-01-31 LAB — BASIC METABOLIC PANEL
Anion gap: 7 (ref 5–15)
BUN: 17 mg/dL (ref 8–23)
CO2: 22 mmol/L (ref 22–32)
Calcium: 8.9 mg/dL (ref 8.9–10.3)
Chloride: 107 mmol/L (ref 98–111)
Creatinine, Ser: 1.21 mg/dL (ref 0.61–1.24)
GFR, Estimated: >60 mL/min (ref >60)
Glucose, Bld: 90 mg/dL (ref 70–99)
Potassium: 4.3 mmol/L (ref 3.5–5.1)
Sodium: 136 mmol/L (ref 135–145)

## 2021-01-31 LAB — CBC
HCT: 29.7 % — ABNORMAL LOW (ref 39.0–52.0)
Hemoglobin: 9.5 g/dL — ABNORMAL LOW (ref 13.0–17.0)
MCH: 29.7 pg (ref 26.0–34.0)
MCHC: 32 g/dL (ref 30.0–36.0)
MCV: 92.8 fL (ref 80.0–100.0)
Platelets: 350 10*3/uL (ref 150–400)
RBC: 3.2 MIL/uL — ABNORMAL LOW (ref 4.22–5.81)
RDW: 12.6 % (ref 11.5–15.5)
WBC: 14.6 10*3/uL — ABNORMAL HIGH (ref 4.0–10.5)
nRBC: 0 % (ref 0.0–0.2)

## 2021-01-31 LAB — GLUCOSE, CAPILLARY
Glucose-Capillary: 96 mg/dL (ref 70–99)
Glucose-Capillary: 171 mg/dL — ABNORMAL HIGH (ref 70–99)
Glucose-Capillary: 150 mg/dL — ABNORMAL HIGH (ref 70–99)
Glucose-Capillary: 163 mg/dL — ABNORMAL HIGH (ref 70–99)

## 2021-01-31 NOTE — Progress Notes (Signed)
Patient ID: Joseph [name], male   DOB: March 27, 1946, 75 y.o.   MRN: [id number] 75 year old male known to Dr. Lajoyce Corners. Seen in the [facility] Office last week and told to come to hospital to have leg surgery after going home to get things in order. Presented Saturday to [facility] for admission and has CKD, CHF, Afib and diabetes mellitus. I have communicated to Dr. Lajoyce Corners the patient's admission and he is aware and will follow up. Tentatively surgery Left Below knee amputation for dysvascular left leg with gangrene of the left great toe. Disease is not reconstructable. Dressing dry 4x4 with Kerlix to hold in place.

## 2021-01-31 NOTE — Plan of Care (Signed)

## 2021-01-31 NOTE — Plan of Care (Signed)
  Problem: Clinical Measurements: Goal: Ability to maintain clinical measurements within normal limits will improve Outcome: Progressing Goal: Will remain free from infection Outcome: Progressing Goal: Diagnostic test results will improve Outcome: Progressing Goal: Respiratory complications will improve Outcome: Progressing Goal: Cardiovascular complication will be avoided Outcome: Progressing   Problem: Pain Managment: Goal: General experience of comfort will improve Outcome: Progressing   Problem: Safety: Goal: Ability to remain free from injury will improve Outcome: Progressing   Problem: Skin Integrity: Goal: Risk for impaired skin integrity will decrease Outcome: Progressing   

## 2021-01-31 NOTE — Progress Notes (Signed)
PROGRESS NOTE    Joseph Omar  EHU:[id number] DOB: 17-Jan-1946 DOA: 01/30/2021 PCP: Hoy Register, MD   Brief Narrative: Joseph [name] is a 75 y.o. male with history of diabetes mellitus, hypertension, CKD stage II, PAD, paroxysmal atrial fibrillation, left foot wound/osteomyelitis, history of right BKA.  Patient presented secondary to reevaluation for left toe osteomyelitis and need for amputation.  He was recently admitted but declined transtibial amputation at that time.   Assessment & Plan:   Principal Problem:   Osteomyelitis of great toe of left foot (HCC) Active Problems:   DM (diabetes mellitus) (HCC)   Hypertension associated with diabetes (HCC)   PVD (peripheral vascular disease) (HCC)   Paroxysmal atrial fibrillation (HCC)   Chronic diastolic congestive heart failure (HCC)   Leukocytosis   Hyperlipidemia   Anemia   CKD (chronic kidney disease) stage 3, GFR 30-59 ml/min (HCC)   Osteomyelitis of left great toe Patient has returned for consideration of amputation. Orthopedic surgery consulted. Blood cultures obtained. -Continue Vancomycin/Ceftriaxone -Follow-up blood cultures -Orthopedic surgery recommendations  Leukocytosis Secondary to above. Improving.  Anemia Iron panel suggests anemia of chronic disease possibly related to chronic infection/CKD. Hemoglobin stable.  Paroxysmal atrial fibrillation Not on anticoagulation secondary to history of GI bleeding or rate/rhythm control. Currently in sinus rhythm. Currently no need for telemetry. -Discontinue telemetry  Chronic diastolic heart failure Last Transthoracic Echocardiogram with an EF of 50-55%.   Diabetes mellitus, type 2 Hemoglobin A1C of 9.4%. patient is on Lantus 15 units daily and glipizide as an outpatient  CKD stage II Does not meet criteria for CKD stage IIIb. Baseline creatinine of about 1.2 - 1.3. 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 Stable.  Primary hypertension Patient is on amlodipine and  hydrochlorothiazide as an outpatient although patient states he is not taking hydrochlorothiazide. -Continue amlodipine  PAD Patient has been evaluated by vascular surgery with no options for revascularization. On aspirin and Lipitor as an outpatient. Contributing to above infection/poor wound healing. -Continue aspirin per orthopedic surgery recommendations  Hyperlipidemia -Continue Lipitor  35 minutes face-to-face; counseling/coordination of care > 50 percent of visit which included mostly of discussion of patient's infection/vascular disease and how it relates to need for amputation (BKA vs transmetatarsal). Also included risks/benefits to having/not having a BKA. Many questions from patient answered.  DVT prophylaxis: Heparin subq Code Status:   Code Status: Full Code Family Communication: None at bedeside Disposition Plan: Discharge pending orthopedic surgery recommendations in addition to likely PT/OT recommendations s/p amputation   Consultants:  Orthopedic surgery  Procedures:  None  Antimicrobials: Ceftriaxone Vancomycin    Subjective: Patient reports no issues overnight. Still contemplating on BKA fs lower level amputation but understands the need for a BKA; still, hoping to avoid.  Objective: Vitals:   01/30/21 1315 01/30/21 1635 01/30/21 2010 01/31/21 0800  BP: (!) 148/78 137/86 130/73 132/72  Pulse: 64 79 62 83  Resp: 12 16 17 18   Temp:  98 F (36.7 C) 98.9 F (37.2 C) 98.4 F (36.9 C)  TempSrc:  Oral  Oral  SpO2: 99% 100% 99% 98%  Weight:  100 kg    Height:  6\' 3"  (1.905 m)      Intake/Output Summary (Last 24 hours) at 01/31/2021 0813 Last data filed at 01/31/2021 0300 Gross per 24 hour  Intake 843.7 ml  Output 950 ml  Net -106.3 ml   Filed Weights   01/30/21 1635  Weight: 100 kg    Examination:  General exam: Appears calm and comfortable Respiratory  system: Clear to auscultation. Respiratory effort normal. Cardiovascular system: S1 & S2  heard, RRR. 2/6 systolic murmur. Faint left DP pulse Gastrointestinal system: Abdomen is nondistended, soft and nontender. No organomegaly or masses felt. Normal bowel sounds heard. Central nervous system: Alert and oriented. No focal neurological deficits. Musculoskeletal: No edema. No calf tenderness. Left foot is wrapped in gauze.  Right BKA. Skin: No cyanosis. No rashes Psychiatry: Judgement and insight appear normal. Mood & affect appropriate.     Data Reviewed: I have personally reviewed following labs and imaging studies  CBC Lab Results  Component Value Date   WBC 14.6 (H) 01/31/2021   RBC 3.20 (L) 01/31/2021   HGB 9.5 (L) 01/31/2021   HCT 29.7 (L) 01/31/2021   MCV 92.8 01/31/2021   MCH 29.7 01/31/2021   PLT 350 01/31/2021   MCHC 32.0 01/31/2021   RDW 12.6 01/31/2021   LYMPHSABS 5.1 (H) 01/30/2021   MONOABS 2.0 (H) 01/30/2021   EOSABS 0.4 01/30/2021   BASOSABS 0.2 (H) 01/30/2021     Last metabolic panel Lab Results  Component Value Date   NA 136 01/31/2021   K 4.3 01/31/2021   CL 107 01/31/2021   CO2 22 01/31/2021   BUN 17 01/31/2021   CREATININE 1.21 01/31/2021   GLUCOSE 90 01/31/2021   GFRNONAA >60 01/31/2021   GFRAA 58 (L) 02/06/2020   CALCIUM 8.9 01/31/2021   PROT 7.8 01/30/2021   ALBUMIN 3.1 (L) 01/30/2021   LABGLOB 3.5 12/30/2020   AGRATIO 1.2 12/30/2020   BILITOT 1.1 01/30/2021   ALKPHOS 62 01/30/2021   AST 31 01/30/2021   ALT 21 01/30/2021   ANIONGAP 7 01/31/2021    CBG (last 3)  Recent Labs    01/30/21 1642 01/30/21 1727 01/31/21 0649  GLUCAP 68* 140* 96     GFR: Estimated Creatinine Clearance: 65 mL/min (by C-G formula based on SCr of 1.21 mg/dL).  Coagulation Profile: No results for input(s): INR, PROTIME in the last 168 hours.  Recent Results (from the past 240 hour(s))  SARS CORONAVIRUS 2 (TAT 6-24 HRS) Nasopharyngeal Nasopharyngeal Swab     Status: None   Collection Time: 01/26/21  3:57 PM   Specimen: Nasopharyngeal Swab   Result Value Ref Range Status   SARS Coronavirus 2 NEGATIVE NEGATIVE Final    Comment: (NOTE) SARS-CoV-2 target nucleic acids are NOT DETECTED.  The SARS-CoV-2 RNA is generally detectable in upper and lower respiratory specimens during the acute phase of infection. Negative results do not preclude SARS-CoV-2 infection, do not rule out co-infections with other pathogens, and should not be used as the sole basis for treatment or other patient management decisions. Negative results must be combined with clinical observations, patient history, and epidemiological information. The expected result is Negative.  Fact Sheet for Patients: HairSlick.no  Fact Sheet for Healthcare Providers: quierodirigir.com  This test is not yet approved or cleared by the [place] FDA and  has been authorized for detection and/or diagnosis of SARS-CoV-2 by FDA under an Emergency Use Authorization (EUA). This EUA will remain  in effect (meaning this test can be used) for the duration of the COVID-19 declaration under Se ction 564(b)(1) of the Act, 21 U.S.C. section 360bbb-3(b)(1), unless the authorization is terminated or revoked sooner.  Performed at [facility], [address] [place], [place] [address]   MRSA Next Gen by PCR, Nasal     Status: None   Collection Time: 01/27/21  8:10 AM   Specimen: Nasal Mucosa; Nasal Swab  Result Value Ref Range Status   MRSA by PCR Next Gen NOT DETECTED NOT DETECTED Final    Comment: (NOTE) The GeneXpert MRSA Assay (FDA approved for NASAL specimens only), is one component of a comprehensive MRSA colonization surveillance program. It is not intended to diagnose MRSA infection nor to guide or monitor treatment for MRSA infections. Test performance is not FDA approved in patients less than 30 years old. Performed at [facility], [address], [place], [place] [address]          Radiology Studies: DG Toe Great Left  Result Date: 01/30/2021 CLINICAL DATA:  Recent osteomyelitis, worsening wound for 1 week in the plantar left first toe EXAM: LEFT GREAT TOE COMPARISON:  01/26/2021 left foot radiographs FINDINGS: Generalized soft tissue swelling. Soft tissue defect in plantar/medial distal left first toe. Loss of cortex compatible with erosive change throughout the medial/plantar aspect of the distal phalanx left first toe, similar to slightly worsened. New mild erosive changes in the medial distal aspect of the proximal phalanx in the left first toe. No focal osseous lesions. No fracture. No dislocation. No radiopaque foreign bodies. IMPRESSION: Generalized soft tissue swelling. Soft tissue defect in the plantar/medial distal left first toe. Loss of cortex compatible with erosive change throughout the medial/plantar aspect of the distal phalanx left first toe, similar to slightly worsened. New mild erosive changes in the medial distal aspect of the proximal phalanx in the left first toe. These findings are compatible with progression of acute osteomyelitis. Electronically Signed   By: Delbert Phenix M.D.   On: 01/30/2021 06:36        Scheduled Meds:  amLODipine  10 mg Oral Daily   atorvastatin  10 mg Oral Daily   heparin  5,000 Units Subcutaneous Q8H   insulin aspart  0-9 Units Subcutaneous TID WC   multivitamin with minerals  1 tablet Oral Daily   sodium chloride flush  3 mL Intravenous Q12H   Continuous Infusions:  sodium chloride 250 mL (01/31/21 0729)   cefTRIAXone (ROCEPHIN)  IV     vancomycin Stopped (01/30/21 1217)     LOS: 1 day     Jacquelin Hawking, MD [facility] 01/31/2021, 8:13 AM  If 7PM-7AM, please contact night-coverage [email or web address]

## 2021-01-31 NOTE — Consult Note (Signed)
ORTHOPAEDIC CONSULTATION  REQUESTING PHYSICIAN: Mariel Aloe, MD  Chief Complaint: Dry gangrene left great toe.  HPI: Joseph [name] is a 75 y.o. male who presents with dry gangrene left great toe.  Patient is status post a right transtibial amputation.  On patient's last admission he did undergo arterial studies with vascular surgery patient did not have revascularization options.  Patient's ankle-brachial indices on his last admission showed absent posterior tibial pulse and a dorsalis pedis dampened monophasic flow of 0.2.  Past Medical History:  Diagnosis Date   Cellulitis of right lower extremity    Diabetes mellitus type 2 in nonobese [facility])    Gangrene of right foot ([place])    Hypertension    Open wound of right foot    Past Surgical History:  Procedure Laterality Date   ABDOMINAL AORTOGRAM W/LOWER EXTREMITY Left 01/27/2021   Procedure: ABDOMINAL AORTOGRAM W/LOWER EXTREMITY;  Surgeon: Cherre Robins, MD;  Location: [place] [facility];  Service: Cardiovascular;  Laterality: Left;   AMPUTATION Right 11/16/2018   Procedure: RIGHT BELOW KNEE AMPUTATION;  Surgeon: Newt Minion, MD;  Location: [place];  Service: Orthopedics;  Laterality: Right;   HAND SURGERY     Social History   Socioeconomic History   Marital status: Divorced    Spouse name: Not on file   Number of children: Not on file   Years of education: Not on file   Highest education level: Not on file  Occupational History   Occupation: retired     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  Tobacco Use   Smoking status: Never   Smokeless tobacco: Never  Substance and Sexual Activity   Alcohol use: Yes   Drug use: No   Sexual activity: Not on file  Other Topics Concern   Not on file  Social History Narrative   Lives alone, 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   On fixed Social security income   Has a daughter and son does not speak with them every day   Most reliable family members reported to live  in [place]   Divorced but remains friends with his ex wife, Lucina Mellow   Has advance directives but has not as 11/03/20 [facility] a copy to [facility]   Goes to church      Social Determinants of Health   Financial Resource Strain: Medium Risk   Difficulty of Paying Living Expenses: Somewhat hard  Food Insecurity: Landscape architect Present   Worried About Charity fundraiser in the Last Year: Sometimes true   Arboriculturist in the Last Year: Sometimes true  Transportation Needs: No Transportation Needs   Lack of Transportation (Medical): No   Lack of Transportation (Non-Medical): No  Physical Activity: Not on file  Stress: No Stress Concern Present   Feeling of Stress : Only a little  Social Connections: Moderately Isolated   Frequency of Communication with Friends and Family: Once a week   Frequency of Social Gatherings with Friends and Family: Once a week   Attends Religious Services: 1 to 4 times per year   Active Member of [employer] or Organizations: Yes   Attends Archivist Meetings: 1 to 4 times per year   Marital Status: Never married   Family History  Problem Relation Age of Onset   Diabetes Father    Cancer Neg Hx    CAD Neg Hx    - negative except otherwise stated in the family history  section No Known Allergies Prior to Admission medications   Medication Sig Start Date End Date Taking? Authorizing Provider  Accu-Chek FastClix Lancets MISC Use to check blood sugars three times per day Patient taking differently: 1 each by Other route See admin instructions. Use to check blood sugars three times per day 05/17/19  Yes Fulp, Cammie, MD  amLODipine (NORVASC) 10 MG tablet Take 1 tablet (10 mg total) by mouth daily. 12/02/20  Yes Argentina Donovan, PA-C  aspirin EC 81 MG tablet Take 81 mg by mouth daily. Swallow whole.   Yes [provider]  glipiZIDE (GLUCOTROL) 10 MG tablet TAKE 1 TABLET (10 MG TOTAL) BY MOUTH 2 (TWO) TIMES DAILY BEFORE A MEAL. Patient taking  differently: Take 10 mg by mouth 2 (two) times daily before a meal. 12/02/20 12/02/21 Yes McClung, Dionne Bucy, PA-C  glucose blood (ACCU-CHEK GUIDE) test strip Use as instructed Patient taking differently: 1 each by Other route as directed. Use as instructed 05/17/19  Yes Fulp, Cammie, MD  Insulin Glargine (BASAGLAR KWIKPEN) 100 UNIT/ML Inject 15 Units into the skin daily. 12/30/20  Yes Freeman Caldron M, PA-C  Insulin Pen Needle 31G X 5 MM MISC use one pen needle once daily. 12/30/20  Yes McClung, Dionne Bucy, PA-C  Lancets Misc. (ACCU-CHEK FASTCLIX LANCET) KIT Use when checking blood sugars three times daily Patient taking differently: 1 each by Other route See admin instructions. Use when checking blood sugars three times daily 05/17/19  Yes Fulp, Cammie, MD  Multiple Vitamins-Minerals (ONE-A-DAY MENS 50+ ADVANTAGE) TABS Take 1 tablet by mouth daily with breakfast.   Yes [provider]  amoxicillin-clavulanate (AUGMENTIN) 875-125 MG tablet Take 1 tablet by mouth 2 (two) times daily. 01/28/21 02/27/21  Patrecia Pour, MD  atorvastatin (LIPITOR) 10 MG tablet Take 1 tablet (10 mg total) by mouth daily. In the evening Patient not taking: No sig reported 12/02/20   Argentina Donovan, PA-C  Blood Glucose Monitoring Suppl (ACCU-CHEK GUIDE ME) w/Device KIT 1 kit by Does not apply route 3 (three) times daily. To check blood sugars Patient not taking: Reported on 01/30/2021 05/17/19   Fulp, Ander Gaster, MD  cadexomer iodine (IODOSORB) 0.9 % gel Apply 1 application topically 3 (three) times a week. Cleanse wound with wound wash solution.  Apply small dab of iodorsorb and cover with a dry sterile dressing daily Patient not taking: No sig reported 01/15/21   Criselda Peaches, DPM  diclofenac sodium (VOLTAREN) 1 % GEL Apply 2 g topically 3 (three) times daily. Patient not taking: No sig reported 12/03/18   Angiulli, Lavon Paganini, PA-C  doxycycline (VIBRA-TABS) 100 MG tablet Take 1 tablet (100 mg total) by mouth 2 (two) times daily.  01/28/21   Newt Minion, MD  hydrochlorothiazide (MICROZIDE) 12.5 MG capsule Take 1 capsule (12.5 mg total) by mouth daily. Patient not taking: No sig reported 12/02/20   Argentina Donovan, PA-C  HYDROcodone-acetaminophen (NORCO/VICODIN) 5-325 MG tablet Take 1 tablet by mouth every 4 (four) hours as needed. 01/28/21   Newt Minion, MD  mupirocin ointment (BACTROBAN) 2 % Apply 1 application topically 2 (two) times daily. Patient not taking: No sig reported 12/02/20   Argentina Donovan, PA-C  polyethylene glycol (MIRALAX / GLYCOLAX) 17 g packet Take 17 g by mouth daily as needed for mild constipation. Patient not taking: No sig reported 11/20/18   Georgette Shell, MD   DG Toe Great Left  Result Date: 01/30/2021 CLINICAL DATA:  Recent osteomyelitis, worsening  wound for 1 week in the plantar left first toe EXAM: LEFT GREAT TOE COMPARISON:  01/26/2021 left foot radiographs FINDINGS: Generalized soft tissue swelling. Soft tissue defect in plantar/medial distal left first toe. Loss of cortex compatible with erosive change throughout the medial/plantar aspect of the distal phalanx left first toe, similar to slightly worsened. New mild erosive changes in the medial distal aspect of the proximal phalanx in the left first toe. No focal osseous lesions. No fracture. No dislocation. No radiopaque foreign bodies. IMPRESSION: Generalized soft tissue swelling. Soft tissue defect in the plantar/medial distal left first toe. Loss of cortex compatible with erosive change throughout the medial/plantar aspect of the distal phalanx left first toe, similar to slightly worsened. New mild erosive changes in the medial distal aspect of the proximal phalanx in the left first toe. These findings are compatible with progression of acute osteomyelitis. Electronically Signed   By: Ilona Sorrel M.D.   On: 01/30/2021 06:36   - pertinent xrays, CT, MRI studies were reviewed and independently interpreted  Positive ROS: All other systems  have been reviewed and were otherwise negative with the exception of those mentioned in the HPI and as above.  Physical Exam: General: Alert, no acute distress Psychiatric: Patient is competent for consent with normal mood and affect Lymphatic: No axillary or cervical lymphadenopathy Cardiovascular: No pedal edema Respiratory: No cyanosis, no use of accessory musculature GI: No organomegaly, abdomen is soft and non-tender    Images:  _0 @  Labs:  Lab Results  Component Value Date   HGBA1C 9.4 (H) 01/26/2021   HGBA1C 11.7 (A) 12/30/2020   HGBA1C 9.8 (A) 02/06/2020   ESRSEDRATE 120 (H) 01/30/2021   CRP 14.5 (H) 01/30/2021   REPTSTATUS PENDING 01/30/2021   REPTSTATUS PENDING 01/30/2021   CULT  01/30/2021    NO GROWTH 1 DAY Performed at [facility], [place] [address], [place], [place] [address]    CULT  01/30/2021    NO GROWTH 1 DAY Performed at [facility] [address] [place], [place] [address]     Lab Results  Component Value Date   ALBUMIN 3.1 (L) 01/30/2021   ALBUMIN 3.0 (L) 01/26/2021   ALBUMIN 4.1 12/30/2020     CBC EXTENDED Latest Ref Rng & Units 01/31/2021 01/30/2021 01/28/2021  WBC 4.0 - 10.5 K/uL 14.6(H) 19.7(H) 15.4(H)  RBC 4.22 - 5.81 MIL/uL 3.20(L) 3.30(L) 3.15(L)  HGB 13.0 - 17.0 g/dL 9.5(L) 10.0(L) 9.5(L)  HCT 39.0 - 52.0 % 29.7(L) 30.3(L) 29.1(L)  PLT 150 - 400 K/uL 350 352 294  NEUTROABS 1.7 - 7.7 K/uL - 12.0(H) 10.9(H)  LYMPHSABS 0.7 - 4.0 K/uL - 5.1(H) 3.7    Neurologic: Patient does not have protective sensation bilateral lower extremities.   MUSCULOSKELETAL:   Skin: Examination patient's left foot has thin atrophic skin there is no ascending cellulitis no abscess.  Patient has dry gangrenous changes of the left great toe.  Patient's white cell count on admission was 19.7 with a hemoglobin of 10.0.  His current white cell count is 14.6 with a hemoglobin of 9.5.  Assessment: Assessment: Diabetic insensate neuropathy with  severe peripheral vascular disease with essentially no circulation to the left foot with dry gangrenous ulcer of the left great toe.  Plan: Discussed with the patient that I would recommend proceeding with a left transtibial amputation.  The circulation to the left foot is insufficient for a ray amputation to heal.  Patient states he understands the risks and benefits but  at this time he would like to consider proceeding with a ray amputation.  Recommended that he discuss this with his family I will rediscuss this with him on Tuesday.  Anticipate surgery on Wednesday.  At this point the patient does not understand the severity of the lack of circulation to his left foot.  Thank you for the consult and the opportunity to see Mr. Vivianne Spence, [place] [phone] 1:43 PM

## 2021-02-01 LAB — BASIC METABOLIC PANEL
Anion gap: 9 (ref 5–15)
BUN: 15 mg/dL (ref 8–23)
CO2: 24 mmol/L (ref 22–32)
Calcium: 9.3 mg/dL (ref 8.9–10.3)
Chloride: 104 mmol/L (ref 98–111)
Creatinine, Ser: 1.26 mg/dL — ABNORMAL HIGH (ref 0.61–1.24)
GFR, Estimated: >60 mL/min (ref >60)
Glucose, Bld: 154 mg/dL — ABNORMAL HIGH (ref 70–99)
Potassium: 4 mmol/L (ref 3.5–5.1)
Sodium: 137 mmol/L (ref 135–145)

## 2021-02-01 LAB — GLUCOSE, CAPILLARY
Glucose-Capillary: 136 mg/dL — ABNORMAL HIGH (ref 70–99)
Glucose-Capillary: 146 mg/dL — ABNORMAL HIGH (ref 70–99)
Glucose-Capillary: 162 mg/dL — ABNORMAL HIGH (ref 70–99)
Glucose-Capillary: 136 mg/dL — ABNORMAL HIGH (ref 70–99)

## 2021-02-01 NOTE — Progress Notes (Signed)
PROGRESS NOTE    Joseph [name]  NGE:[id number] DOB: 02/16/1946 DOA: 01/30/2021 PCP: Hoy Register, MD   Brief Narrative:  This 75 y.o. male with PMH significant of diabetes mellitus, hypertension, CKD stage II, PAD, paroxysmal atrial fibrillation, left foot wound /osteomyelitis, history of right BKA.  Patient presented for the reevaluation for left toe osteomyelitis and need for amputation. He was recently admitted but declined transtibial amputation at that time. Orthopedics is consulted and patient is  scheduled to have ray amputation on Wednesday.  Assessment & Plan:   Principal Problem:   Osteomyelitis of great toe of left foot (HCC) Active Problems:   DM (diabetes mellitus) (HCC)   Hypertension associated with diabetes (HCC)   PVD (peripheral vascular disease) (HCC)   Paroxysmal atrial fibrillation (HCC)   Chronic diastolic congestive heart failure (HCC)   CKD (chronic kidney disease), stage II   Leukocytosis   Hyperlipidemia   Anemia  Osteomyelitis of left great toe: Patient was recently admitted but has declined transtibial amputation at the time. Patient has returned for consideration of amputation. Orthopedic surgery consulted. Continue vancomycin and ceftriaxone. Follow blood cultures Dr. Lajoyce Corners had long discussion with patient.  He was recommended left transtibial amputation but he would prefer to proceed with ray amputation.  Anticipated surgery on Wednesday  Leukocytosis : could be secondary to above.   Anemia of chronic disease: Iron panel suggests anemia of chronic disease possibly related to chronic infection/CKD. Hemoglobin stable.   Paroxysmal atrial fibrillation: Currently in sinus rhythm, heart rate controlled. Not on anticoagulation secondary to history of GI bleed Discontinue telemetry   Chronic diastolic heart failure. Last TTE: LV EF of 50-55%.    Type 2 diabetes: Hemoglobin A1c 9.4% . Continue Lantus 15 units daily. Regular insulin sliding  scale.   CKD stage II: Baseline creatinine 1.2-1.3. Does not meet criteria for CKD stage IIIb.  Renal functions at baseline.   Essential hypertension : Continue amlodipine.    Peripheral arterial disease: Patient has been evaluated by vascular surgery with no options for revascularization.  On aspirin and Lipitor as an outpatient. Contributing to above infection/poor wound healing. Continue aspirin per orthopedic surgery recommendations   Hyperlipidemia: Continue Lipitor.  DVT prophylaxis: Heparin subcu Code Status: Full code Family Communication: No family at bedside Disposition Plan:  Status is: Inpatient  Remains inpatient appropriate because:Inpatient level of care appropriate due to severity of illness  Dispo: The patient is from: Home              Anticipated d/c is to: SNF              Patient currently is not medically stable to d/c.   Difficult to place patient No   Consultants:  Orthopedic surgery  Procedures: Scheduled ray amputation 02/02/2021.  Antimicrobials: Anti-infectives (From admission, onward)    Start     Dose/Rate Route Frequency Ordered Stop   01/31/21 1600  cefTRIAXone (ROCEPHIN) 2 g in sodium chloride 0.9 % 100 mL IVPB       Note to Pharmacy: 24 hours from first dose in ER.   2 g 200 mL/hr over 30 Minutes Intravenous Every 24 hours 01/30/21 1508     01/30/21 1400  cefTRIAXone (ROCEPHIN) 2 g in sodium chloride 0.9 % 100 mL IVPB        2 g 200 mL/hr over 30 Minutes Intravenous  Once 01/30/21 1358 01/30/21 1902   01/30/21 1000  vancomycin (VANCOREADY) IVPB 1750 mg/350 mL  1,750 mg 175 mL/hr over 120 Minutes Intravenous Every 24 hours 01/30/21 0947         Subjective: Patient was seen and examined at bedside.  Overnight events noted.   Patient reports feeling better, has developed some blisters after procedure in the right thigh.  Objective: Vitals:   01/31/21 0800 01/31/21 1436 01/31/21 2143 02/01/21 0725  BP: 132/72 100/65 126/90  115/76  Pulse: 83 66 80 70  Resp: 18  18 16   Temp: 98.4 F (36.9 C) 98.5 F (36.9 C) 98.4 F (36.9 C) 98 F (36.7 C)  TempSrc: Oral Oral Oral Oral  SpO2: 98% 100% 100% 99%  Weight:      Height:        Intake/Output Summary (Last 24 hours) at 02/01/2021 1422 Last data filed at 02/01/2021 0900 Gross per 24 hour  Intake 729.65 ml  Output 650 ml  Net 79.65 ml   Filed Weights   01/30/21 1635  Weight: 100 kg    Examination:  General exam: Appears comfortable, not in any acute distress. Respiratory system: Clear to auscultation bilaterally, no wheezing. Cardiovascular system: S1-S2 heard, regular rate and rhythm, no murmur. Gastrointestinal system: Abdomen is soft, nontender, nondistended, BS + Central nervous system: Alert and oriented. No focal neurological deficits. Extremities: Right BKA.  Right thigh blisters. Skin: No rashes, lesions or ulcers Psychiatry: Judgement and insight appear normal. Mood & affect appropriate.     Data Reviewed: I have personally reviewed following labs and imaging studies  CBC: Recent Labs  Lab 01/26/21 0826 01/27/21 0331 01/28/21 0225 01/30/21 0556 01/31/21 0115  WBC 17.2* 16.4* 15.4* 19.7* 14.6*  NEUTROABS 10.9*  --  10.9* 12.0*  --   HGB 10.1* 10.2* 9.5* 10.0* 9.5*  HCT 31.8* 31.7* 29.1* 30.3* 29.7*  MCV 94.6 94.1 92.4 91.8 92.8  PLT 315 338 294 352 350   Basic Metabolic Panel: Recent Labs  Lab 01/27/21 0331 01/28/21 0225 01/30/21 0556 01/31/21 0115 02/01/21 1104  NA 137 134* 133* 136 137  K 4.2 4.1 3.9 4.3 4.0  CL 105 105 101 107 104  CO2 24 22 21* 22 24  GLUCOSE 149* 153* 145* 90 154*  BUN 22 16 20 17 15   CREATININE 1.36* 1.26* 1.52* 1.21 1.26*  CALCIUM 9.4 8.5* 9.3 8.9 9.3   GFR: Estimated Creatinine Clearance: 62.4 mL/min (A) (by C-G formula based on SCr of 1.26 mg/dL (H)). Liver Function Tests: Recent Labs  Lab 01/26/21 0826 01/30/21 0556  AST 18 31  ALT 14 21  ALKPHOS 55 62  BILITOT 0.7 1.1  PROT 7.3 7.8   ALBUMIN 3.0* 3.1*   No results for input(s): LIPASE, AMYLASE in the last 168 hours. No results for input(s): AMMONIA in the last 168 hours. Coagulation Profile: No results for input(s): INR, PROTIME in the last 168 hours. Cardiac Enzymes: No results for input(s): CKTOTAL, CKMB, CKMBINDEX, TROPONINI in the last 168 hours. BNP (last 3 results) No results for input(s): PROBNP in the last 8760 hours. HbA1C: No results for input(s): HGBA1C in the last 72 hours. CBG: Recent Labs  Lab 01/31/21 1109 01/31/21 1431 01/31/21 2109 02/01/21 0626 02/01/21 1106  GLUCAP 171* 150* 163* 136* 146*   Lipid Profile: No results for input(s): CHOL, HDL, LDLCALC, TRIG, CHOLHDL, LDLDIRECT in the last 72 hours. Thyroid Function Tests: No results for input(s): TSH, T4TOTAL, FREET4, T3FREE, THYROIDAB in the last 72 hours. Anemia Panel: No results for input(s): VITAMINB12, FOLATE, FERRITIN, TIBC, IRON, RETICCTPCT in the last 72 hours.  Sepsis Labs: Recent Labs  Lab 01/26/21 0826 01/30/21 0556  LATICACIDVEN 0.8 1.6    Recent Results (from the past 240 hour(s))  SARS CORONAVIRUS 2 (TAT 6-24 HRS) Nasopharyngeal Nasopharyngeal Swab     Status: None   Collection Time: 01/26/21  3:57 PM   Specimen: Nasopharyngeal Swab  Result Value Ref Range Status   SARS Coronavirus 2 NEGATIVE NEGATIVE Final    Comment: (NOTE) SARS-CoV-2 target nucleic acids are NOT DETECTED.  The SARS-CoV-2 RNA is generally detectable in upper and lower respiratory specimens during the acute phase of infection. Negative results do not preclude SARS-CoV-2 infection, do not rule out co-infections with other pathogens, and should not be used as the sole basis for treatment or other patient management decisions. Negative results must be combined with clinical observations, patient history, and epidemiological information. The expected result is Negative.  Fact Sheet for Patients: HairSlick.no  Fact  Sheet for Healthcare Providers: quierodirigir.com  This test is not yet approved or cleared by the [place] FDA and  has been authorized for detection and/or diagnosis of SARS-CoV-2 by FDA under an Emergency Use Authorization (EUA). This EUA will remain  in effect (meaning this test can be used) for the duration of the COVID-19 declaration under Se ction 564(b)(1) of the Act, 21 U.S.C. section 360bbb-3(b)(1), unless the authorization is terminated or revoked sooner.  Performed at [facility], [address] [place], [place] [address]   MRSA Next Gen by PCR, Nasal     Status: None   Collection Time: 01/27/21  8:10 AM   Specimen: Nasal Mucosa; Nasal Swab  Result Value Ref Range Status   MRSA by PCR Next Gen NOT DETECTED NOT DETECTED Final    Comment: (NOTE) The GeneXpert MRSA Assay (FDA approved for NASAL specimens only), is one component of a comprehensive MRSA colonization surveillance program. It is not intended to diagnose MRSA infection nor to guide or monitor treatment for MRSA infections. Test performance is not FDA approved in patients less than 57 years old. Performed at [facility], [address], [place], [place] [address]   Blood culture (routine x 2)     Status: None (Preliminary result)   Collection Time: 01/30/21  5:56 AM   Specimen: BLOOD  Result Value Ref Range Status   Specimen Description BLOOD SITE NOT SPECIFIED  Final   Special Requests   Final    BOTTLES DRAWN AEROBIC AND ANAEROBIC Blood Culture adequate volume   Culture   Final    NO GROWTH 2 DAYS Performed at [facility], [address] [place], [place] [address]    Report Status PENDING  Incomplete  Blood culture (routine x 2)     Status: None (Preliminary result)   Collection Time: 01/30/21  5:56 AM   Specimen: BLOOD  Result Value Ref Range Status   Specimen Description BLOOD SITE NOT SPECIFIED  Final   Special Requests AEROBIC BOTTLE ONLY Blood  Culture adequate volume  Final   Culture   Final    NO GROWTH 2 DAYS Performed at [facility], [address], [place], [place] [address]    Report Status PENDING  Incomplete    Radiology Studies: No results found.  Scheduled Meds:  amLODipine  10 mg Oral Daily   atorvastatin  10 mg Oral Daily   heparin  5,000 Units Subcutaneous Q8H   insulin aspart  0-9 Units Subcutaneous TID WC   multivitamin with minerals  1 tablet  Oral Daily   sodium chloride flush  3 mL Intravenous Q12H   Continuous Infusions:  sodium chloride 250 mL (01/31/21 0729)   cefTRIAXone (ROCEPHIN)  IV 2 g (01/31/21 1525)   vancomycin 1,750 mg (02/01/21 1115)     LOS: 2 days    Time spent: 35 mins    Xzandria Clevinger, MD [facility]   If 7PM-7AM, please contact night-coverage

## 2021-02-02 ENCOUNTER — Other Ambulatory Visit: Payer: Self-pay

## 2021-02-02 ENCOUNTER — Other Ambulatory Visit: Payer: Self-pay | Admitting: Physician Assistant

## 2021-02-02 LAB — CBC
HCT: 29 % — ABNORMAL LOW (ref 39.0–52.0)
Hemoglobin: 9.4 g/dL — ABNORMAL LOW (ref 13.0–17.0)
MCH: 29.9 pg (ref 26.0–34.0)
MCHC: 32.4 g/dL (ref 30.0–36.0)
MCV: 92.4 fL (ref 80.0–100.0)
Platelets: 382 10*3/uL (ref 150–400)
RBC: 3.14 MIL/uL — ABNORMAL LOW (ref 4.22–5.81)
RDW: 12.7 % (ref 11.5–15.5)
WBC: 14.5 10*3/uL — ABNORMAL HIGH (ref 4.0–10.5)
nRBC: 0 % (ref 0.0–0.2)

## 2021-02-02 LAB — GLUCOSE, CAPILLARY
Glucose-Capillary: 185 mg/dL — ABNORMAL HIGH (ref 70–99)
Glucose-Capillary: 134 mg/dL — ABNORMAL HIGH (ref 70–99)
Glucose-Capillary: 157 mg/dL — ABNORMAL HIGH (ref 70–99)
Glucose-Capillary: 157 mg/dL — ABNORMAL HIGH (ref 70–99)
Glucose-Capillary: 147 mg/dL — ABNORMAL HIGH (ref 70–99)

## 2021-02-02 LAB — TYPE AND SCREEN
ABO/RH(D): A POS
Antibody Screen: NEGATIVE

## 2021-02-02 MED ORDER — CEFAZOLIN SODIUM-DEXTROSE 2-4 GM/100ML-% IV SOLN
2.0000 g | INTRAVENOUS | Status: AC
  Administered 2021-02-03: 2 g via INTRAVENOUS
  Filled 2021-02-02 (×2): qty 100

## 2021-02-02 NOTE — H&P (View-Only) (Signed)
Patient ID: Joseph [name], male   DOB: 02/22/1946, 75 y.o.   MRN: [id number] Patient is seen in follow-up for osteomyelitis of the left great toe with essentially no circulation from the ankle distally.  His dorsalis pedis ABI is 0.2 posterior tibial ABI is 0.  Reviewed surgical options and patient states he would like to proceed with a transtibial amputation on the left.  We will plan for surgery tomorrow Wednesday.  Patient states that he would like to be evaluated for inpatient rehab.

## 2021-02-02 NOTE — TOC Progression Note (Deleted)
Transition of Care [facility]) - Progression Note    Patient Details  Name: Joseph [name] MRN: [id number] Date of Birth: Oct 14, 1945  [facility]) CM/SW Contact  Milinda Antis, [place] Phone Number: 02/02/2021, 5:03 PM  Clinical Narrative:     CSW received consult from RN in reference to patient wanting to speak with CSW about "social issues".  CSW met with the patient at bedside and patient was alert and oriented x4.  Patient inquired about Medicaid to assist with receiving an in home aid and transportation to medical appointments once the patient returns home.  The patient also requested to go to in patient rehab, not a SNF.  The patient reports that he has a friend who will be coming to live with him and assist once he is discharged.    CSW contacted financial counseling and informed them of the patient's request to be screened for Medicaid.  CSW also spoke with CIR representative Clemens Catholic) and informed of the patient's request for CIR.     Expected Discharge Plan and Services                                                 Social Determinants of Health (SDOH) Interventions    Readmission Risk Interventions No flowsheet data found.

## 2021-02-02 NOTE — TOC Initial Note (Signed)
Transition of Care [facility]) - Initial/Assessment Note    Patient Details  Name: Joseph [name] MRN: [id number] Date of Birth: 09-17-1945  Transition of Care [facility]) CM/SW Contact:    Milinda Antis, [place] Phone Number: 02/02/2021, 5:13 PM  Clinical Narrative:                  CSW received consult from RN in reference to patient wanting to speak with CSW about "social issues".  CSW met with the patient at bedside and patient was alert and oriented x4.  Patient inquired about Medicaid to assist with receiving an in home aid and transportation to medical appointments once the patient returns home.  The patient also requested to go to in patient rehab, not a SNF.  The patient reports that he has a friend who will be coming to live with him and assist once he is discharged.    CSW contacted financial counseling and informed them of the patient's request to be screened for Medicaid.  CSW also spoke with CIR representative Clemens Catholic) and informed of the patient's request for CIR.        Patient Goals and CMS Choice        Expected Discharge Plan and Services                                                Prior Living Arrangements/Services                       Activities of Daily Living Home Assistive Devices/Equipment: Prosthesis, Wheelchair, Environmental consultant (specify type), Cane (specify quad or straight) ADL Screening (condition at time of admission) Patient's cognitive ability adequate to safely complete daily activities?: Yes Is the patient deaf or have difficulty hearing?: No Does the patient have difficulty seeing, even when wearing glasses/contacts?: No Does the patient have difficulty concentrating, remembering, or making decisions?: No Patient able to express need for assistance with ADLs?: Yes Does the patient have difficulty dressing or bathing?: No Independently performs ADLs?: Yes (appropriate for developmental age) Does the patient have difficulty walking or  climbing stairs?: No Weakness of Legs: None Weakness of Arms/Hands: None  Permission Sought/Granted                  Emotional Assessment              Admission diagnosis:  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] Osteomyelitis of foot, unspecified laterality, unspecified type ([place]) [M86.9] Osteomyelitis of great toe of left foot ([place]) [M86.9] Patient Active Problem List   Diagnosis Date Noted   Hyperlipidemia 01/30/2021   Anemia 01/30/2021   Osteomyelitis ([place]) 01/26/2021   Osteomyelitis of great toe of left foot (HCC)    Postoperative pain    AKI (acute kidney injury) ([place])    Acute blood loss anemia    Chronic diastolic congestive heart failure ([place])    Diabetic peripheral neuropathy (HCC)    CKD (chronic kidney disease), stage II    Hypoalbuminemia due to protein-calorie malnutrition ([place])    Leukocytosis    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[place]) 11/20/2018   Paroxysmal atrial fibrillation (HCC)    Diabetic polyneuropathy associated with type 2 diabetes mellitus ([place])    PVD (peripheral vascular disease) ([place])    Critical lower limb ischemia ([place])    Sepsis with acute renal failure ([place]) 11/13/2018  Acute renal failure superimposed on stage 2 chronic kidney disease ([place])    'light-for-dates' infant with signs of fetal malnutrition 10/17/2018   DM (diabetes mellitus) ([place]) 07/02/2012   Hypertension associated with diabetes ([place]) 07/02/2012   PCP:  Charlott Rakes, MD Pharmacy:   [facility] [address] [place] [address] Phone: [phone] Fax: [place], [place] - [facility] [place] [address] Phone: [phone] Fax: [phone]     Social Determinants of Health (SDOH) Interventions    Readmission Risk Interventions No flowsheet data found.

## 2021-02-02 NOTE — Anesthesia Preprocedure Evaluation (Addendum)
Anesthesia Evaluation  Patient identified by MRN, date of birth, ID band Patient awake    Reviewed: Allergy & Precautions, NPO status , Patient's Chart, lab work & pertinent test results  Airway Mallampati: II  TM Distance: >3 FB Neck ROM: Full    Dental no notable dental hx. (+) Missing, Edentulous Upper,    Pulmonary neg pulmonary ROS,    Pulmonary exam normal breath sounds clear to auscultation       Cardiovascular hypertension, + Peripheral Vascular Disease  Normal cardiovascular exam+ dysrhythmias Atrial Fibrillation and Supra Ventricular Tachycardia  Rhythm:Regular Rate:Normal  10/2018 Echo 1. Moderate hypokinesis of the left ventricular, entire anteroseptal  wall.  2. The left ventricle has low normal systolic function, with an ejection  fraction of 50-55%. The cavity size was normal. Left ventricular diastolic  Doppler parameters are consistent with pseudonormalization.  3. The right ventricle has normal systolic function. The cavity was  normal. There is no increase in right ventricular wall thickness. Right  ventricular systolic pressure is mildly elevated.  4. Left atrial size was mildly dilated.  5. The mitral valve is grossly normal. Mild thickening of the mitral  valve leaflet. Mild calcification of the mitral valve leaflet. There is  moderate mitral annular calcification present.  6. Tricuspid valve regurgitation is moderate-severe.  7. The aortic valve is tricuspid. Moderate thickening of the aortic  valve. Moderate calcification of the aortic valve. Aortic valve  regurgitation is trivial by color flow Doppler. Mild stenosis of the  aortic valve.  8. RCC appears fixed but LCC/NCC open well.  9. The inferior vena cava was dilated in size with <50% respiratory  variability.    Neuro/Psych negative neurological ROS  negative psych ROS   GI/Hepatic   Endo/Other  diabetes, Well Controlled, Type 2   Renal/GU Renal InsufficiencyRenal diseaseLab Results      Component                Value               Date                      CREATININE               1.26 (H)            02/01/2021                BUN                      15                  02/01/2021                NA                       137                 02/01/2021                K                        4.0                 02/01/2021                CL  104                 02/01/2021                CO2                      24                  02/01/2021                Musculoskeletal negative musculoskeletal ROS (+)   Abdominal   Peds  Hematology  (+) anemia , Lab Results      Component                Value               Date                      WBC                      14.5 (H)            02/02/2021                HGB                      9.4 (L)             02/02/2021                HCT                      29.0 (L)            02/02/2021                MCV                      92.4                02/02/2021                PLT                      382                 02/02/2021              Anesthesia Other Findings   Reproductive/Obstetrics                           Anesthesia Physical Anesthesia Plan  ASA: 4  Anesthesia Plan: Regional   Post-op Pain Management:  Regional for Post-op pain   Induction:   PONV Risk Score and Plan: 2 and Treatment may vary due to age or medical condition, Ondansetron and Propofol infusion  Airway Management Planned: Nasal Cannula and Natural Airway  Additional Equipment: None  Intra-op Plan:   Post-operative Plan:   Informed Consent: I have reviewed the patients History and Physical, chart, labs and discussed the procedure including the risks, benefits and alternatives for the proposed anesthesia with the patient or authorized representative who has indicated his/her understanding and acceptance.     Dental advisory  given  Plan Discussed with: CRNA and Anesthesiologist  Anesthesia Plan Comments: (L popliteal & L adductor)       Anesthesia Quick Evaluation

## 2021-02-02 NOTE — Progress Notes (Signed)
Patient ID: Joseph [name], male   DOB: 01/11/1946, 75 y.o.   MRN: [id number] Patient is seen in follow-up for osteomyelitis of the left great toe with essentially no circulation from the ankle distally.  His dorsalis pedis ABI is 0.2 posterior tibial ABI is 0.  Reviewed surgical options and patient states he would like to proceed with a transtibial amputation on the left.  We will plan for surgery tomorrow Wednesday.  Patient states that he would like to be evaluated for inpatient rehab. 

## 2021-02-02 NOTE — Plan of Care (Signed)

## 2021-02-02 NOTE — Progress Notes (Signed)
Pharmacy Antibiotic Note  Joseph [name] is a 75 y.o. male admitted on 01/30/2021 with progression of osteomyletis of L-great toe .  Pharmacy has been consulted for Vancomycin dosing. Patient was on doxycycline with plan to have amputation in ~2 weeks from 9/1. Now presents with worsening infection and to escalate to IV Vancomycin.   WBC down to 14.5, SCr 1.26  Plan: Continue Vancomycin at  1750 mg IV every 24 hours (SCr used 1.26, eAUC 432) If continue, will need to obtain vancomycin levels at [facility] Plans for L transtibial amputation on 9/7. F/u antibiotic plans after surgery   Height: 6\' 3"  (190.5 cm) Weight: 100 kg (220 lb 7.4 oz) IBW/kg (Calculated) : 84.5  Temp (24hrs), Avg:98.5 F (36.9 C), Min:98.2 F (36.8 C), Max:98.6 F (37 C)  Recent Labs  Lab 01/27/21 0331 01/28/21 0225 01/30/21 0556 01/31/21 0115 02/01/21 1104 02/02/21 0254  WBC 16.4* 15.4* 19.7* 14.6*  --  14.5*  CREATININE 1.36* 1.26* 1.52* 1.21 1.26*  --   LATICACIDVEN  --   --  1.6  --   --   --      Estimated Creatinine Clearance: 62.4 mL/min (A) (by C-G formula based on SCr of 1.26 mg/dL (H)).    No Known Allergies  Antimicrobials this admission: Vancomycin 8/31 >>9/1; 9/3 >> Doxycycline 9/1>>9/3 Ceftriaxone 9/3 >>  Dose adjustments this admission: N/a  Microbiology results: 9/3 BCx: ngtd   Thank you for allowing pharmacy to be a part of this patient's care.  11/3, PharmD., BCPS, BCCCP Clinical Pharmacist Please refer to [facility] for unit-specific pharmacist

## 2021-02-02 NOTE — Progress Notes (Signed)
PROGRESS NOTE    Joseph [name]  ZOX:[id number] DOB: [birth year] DOA: 01/30/2021 PCP: Hoy RegisterNewlin, Enobong, MD   Brief Narrative:  This 75 y.o. male with PMH significant of diabetes mellitus, hypertension, CKD stage II, PAD, paroxysmal atrial fibrillation, left foot wound /osteomyelitis, history of right BKA. Patient presented for the reevaluation of left toe osteomyelitis and need for amputation. He was recently admitted but declined transtibial amputation at that time. Orthopedics is consulted and patient is scheduled to have transtibial amputation on Wednesday.  Assessment & Plan:   Principal Problem:   Osteomyelitis of great toe of left foot (HCC) Active Problems:   DM (diabetes mellitus) (HCC)   Hypertension associated with diabetes (HCC)   PVD (peripheral vascular disease) (HCC)   Paroxysmal atrial fibrillation (HCC)   Chronic diastolic congestive heart failure (HCC)   CKD (chronic kidney disease), stage II   Leukocytosis   Hyperlipidemia   Anemia  Osteomyelitis of left great toe: Patient was recently admitted but has declined transtibial amputation at that time. Patient has returned for consideration of amputation. Orthopedic surgery consulted. Continue vancomycin and ceftriaxone. Blood cultures no growth so far. Dr. Lajoyce Cornersuda had long discussion with patient. He was recommended left transtibial amputation. He is scheduled for transtibial amputation on 02/03/21.  Leukocytosis : could be secondary to above.   Anemia of chronic disease: Iron panel suggests anemia of chronic disease possibly related to chronic infection /CKD.  Hemoglobin stable.  No obvious signs of bleeding noted.   Paroxysmal atrial fibrillation: Currently remains in NSR, heart rate controlled. Not on anticoagulation secondary to history of GI bleed Discontinue telemetry.   Chronic diastolic heart failure. Last TTE: LVEF of 50-55%.  Appears euvolemic.   Type 2 diabetes: Hemoglobin A1c 9.4% . He uses Lantus 15  units daily at home. Regular insulin sliding scale.   CKD stage II: Baseline creatinine 1.2-1.3. Does not meet criteria for CKD stage IIIb.  Renal functions at baseline.   Essential hypertension : Continue amlodipine.    Peripheral arterial disease: Patient has been evaluated by vascular surgery with no options for revascularization.  On aspirin and Lipitor as an outpatient. Contributing to above infection / poor wound healing. Continue aspirin per orthopedic surgery recommendations.   Hyperlipidemia: Continue Lipitor.  DVT prophylaxis: Heparin subcu Code Status: Full code Family Communication: No family at bedside Disposition Plan:  Status is: Inpatient  Remains inpatient appropriate because:Inpatient level of care appropriate due to severity of illness  Dispo: The patient is from: Home              Anticipated d/c is to: SNF              Patient currently is not medically stable to d/c.   Difficult to place patient No   Consultants:  Orthopedic surgery  Procedures:  Scheduled transtibial amputation left on 02/03/2021  Antimicrobials: Anti-infectives (From admission, onward)    Start     Dose/Rate Route Frequency Ordered Stop   01/31/21 1600  cefTRIAXone (ROCEPHIN) 2 g in sodium chloride 0.9 % 100 mL IVPB       Note to Pharmacy: 24 hours from first dose in ER.   2 g 200 mL/hr over 30 Minutes Intravenous Every 24 hours 01/30/21 1508     01/30/21 1400  cefTRIAXone (ROCEPHIN) 2 g in sodium chloride 0.9 % 100 mL IVPB        2 g 200 mL/hr over 30 Minutes Intravenous  Once 01/30/21 1358 01/30/21 1902   01/30/21 1000  vancomycin (  VANCOREADY) IVPB 1750 mg/350 mL        1,750 mg 175 mL/hr over 120 Minutes Intravenous Every 24 hours 01/30/21 0947         Subjective: Patient was seen and examined at bedside.  Overnight events noted.   Patient reports feeling better, states pain is controlled with pain medications.   Patient is scheduled to have transtibial amputation  tomorrow.  Objective: Vitals:   02/01/21 0725 02/01/21 1535 02/01/21 2017 02/02/21 0843  BP: 115/76 131/82 116/70 116/63  Pulse: 70 64 68 75  Resp: 16 16 16 16   Temp: 98 F (36.7 C) 98.6 F (37 C) 98.2 F (36.8 C) 98.6 F (37 C)  TempSrc: Oral Oral Oral Oral  SpO2: 99% 100% 99% 100%  Weight:      Height:        Intake/Output Summary (Last 24 hours) at 02/02/2021 1220 Last data filed at 02/02/2021 0800 Gross per 24 hour  Intake 573.48 ml  Output 1620 ml  Net -1046.52 ml   Filed Weights   01/30/21 1635  Weight: 100 kg    Examination:  General exam: Appears comfortable, not in any acute distress.   Respiratory system: Clear to auscultation bilaterally, no wheezing. Cardiovascular system: S1-S2 heard, regular rate and rhythm, no murmur. Gastrointestinal system: Abdomen is soft, nontender, nondistended, BS + Central nervous system: Alert and oriented. No focal neurological deficits. Extremities: Right BKA.  Left great toe wrapped in dressing. Skin: No rashes, lesions or ulcers Psychiatry: Judgement and insight appear normal. Mood & affect appropriate.     Data Reviewed: I have personally reviewed following labs and imaging studies  CBC: Recent Labs  Lab 01/27/21 0331 01/28/21 0225 01/30/21 0556 01/31/21 0115 02/02/21 0254  WBC 16.4* 15.4* 19.7* 14.6* 14.5*  NEUTROABS  --  10.9* 12.0*  --   --   HGB 10.2* 9.5* 10.0* 9.5* 9.4*  HCT 31.7* 29.1* 30.3* 29.7* 29.0*  MCV 94.1 92.4 91.8 92.8 92.4  PLT 338 294 352 350 382   Basic Metabolic Panel: Recent Labs  Lab 01/27/21 0331 01/28/21 0225 01/30/21 0556 01/31/21 0115 02/01/21 1104  NA 137 134* 133* 136 137  K 4.2 4.1 3.9 4.3 4.0  CL 105 105 101 107 104  CO2 24 22 21* 22 24  GLUCOSE 149* 153* 145* 90 154*  BUN 22 16 20 17 15   CREATININE 1.36* 1.26* 1.52* 1.21 1.26*  CALCIUM 9.4 8.5* 9.3 8.9 9.3   GFR: Estimated Creatinine Clearance: 62.4 mL/min (A) (by C-G formula based on SCr of 1.26 mg/dL (H)). Liver  Function Tests: Recent Labs  Lab 01/30/21 0556  AST 31  ALT 21  ALKPHOS 62  BILITOT 1.1  PROT 7.8  ALBUMIN 3.1*   No results for input(s): LIPASE, AMYLASE in the last 168 hours. No results for input(s): AMMONIA in the last 168 hours. Coagulation Profile: No results for input(s): INR, PROTIME in the last 168 hours. Cardiac Enzymes: No results for input(s): CKTOTAL, CKMB, CKMBINDEX, TROPONINI in the last 168 hours. BNP (last 3 results) No results for input(s): PROBNP in the last 8760 hours. HbA1C: No results for input(s): HGBA1C in the last 72 hours. CBG: Recent Labs  Lab 02/01/21 1106 02/01/21 1612 02/01/21 2057 02/02/21 0638 02/02/21 1119  GLUCAP 146* 162* 136* 134* 157*   Lipid Profile: No results for input(s): CHOL, HDL, LDLCALC, TRIG, CHOLHDL, LDLDIRECT in the last 72 hours. Thyroid Function Tests: No results for input(s): TSH, T4TOTAL, FREET4, T3FREE, THYROIDAB in the last 72  hours. Anemia Panel: No results for input(s): VITAMINB12, FOLATE, FERRITIN, TIBC, IRON, RETICCTPCT in the last 72 hours. Sepsis Labs: Recent Labs  Lab 01/30/21 0556  LATICACIDVEN 1.6    Recent Results (from the past 240 hour(s))  SARS CORONAVIRUS 2 (TAT 6-24 HRS) Nasopharyngeal Nasopharyngeal Swab     Status: None   Collection Time: 01/26/21  3:57 PM   Specimen: Nasopharyngeal Swab  Result Value Ref Range Status   SARS Coronavirus 2 NEGATIVE NEGATIVE Final    Comment: (NOTE) SARS-CoV-2 target nucleic acids are NOT DETECTED.  The SARS-CoV-2 RNA is generally detectable in upper and lower respiratory specimens during the acute phase of infection. Negative results do not preclude SARS-CoV-2 infection, do not rule out co-infections with other pathogens, and should not be used as the sole basis for treatment or other patient management decisions. Negative results must be combined with clinical observations, patient history, and epidemiological information. The expected result is  Negative.  Fact Sheet for Patients: HairSlick.no  Fact Sheet for Healthcare Providers: quierodirigir.com  This test is not yet approved or cleared by the [place] FDA and  has been authorized for detection and/or diagnosis of SARS-CoV-2 by FDA under an Emergency Use Authorization (EUA). This EUA will remain  in effect (meaning this test can be used) for the duration of the COVID-19 declaration under Se ction 564(b)(1) of the Act, 21 U.S.C. section 360bbb-3(b)(1), unless the authorization is terminated or revoked sooner.  Performed at [facility], [address], [place], [place] [address]   MRSA Next Gen by PCR, Nasal     Status: None   Collection Time: 01/27/21  8:10 AM   Specimen: Nasal Mucosa; Nasal Swab  Result Value Ref Range Status   MRSA by PCR Next Gen NOT DETECTED NOT DETECTED Final    Comment: (NOTE) The GeneXpert MRSA Assay (FDA approved for NASAL specimens only), is one component of a comprehensive MRSA colonization surveillance program. It is not intended to diagnose MRSA infection nor to guide or monitor treatment for MRSA infections. Test performance is not FDA approved in patients less than 15 years old. Performed at [facility], [address], [place], [place] [address]   Blood culture (routine x 2)     Status: None (Preliminary result)   Collection Time: 01/30/21  5:56 AM   Specimen: BLOOD  Result Value Ref Range Status   Specimen Description BLOOD SITE NOT SPECIFIED  Final   Special Requests   Final    BOTTLES DRAWN AEROBIC AND ANAEROBIC Blood Culture adequate volume   Culture   Final    NO GROWTH 3 DAYS Performed at [facility], [address], [place], [place] [address]    Report Status PENDING  Incomplete  Blood culture (routine x 2)     Status: None (Preliminary result)   Collection Time: 01/30/21  5:56 AM   Specimen: BLOOD  Result Value Ref Range Status   Specimen  Description BLOOD SITE NOT SPECIFIED  Final   Special Requests AEROBIC BOTTLE ONLY Blood Culture adequate volume  Final   Culture   Final    NO GROWTH 3 DAYS Performed at [facility], [address], [place], [place] [address]    Report Status PENDING  Incomplete    Radiology Studies: No results found.  Scheduled Meds:  amLODipine  10 mg Oral Daily   atorvastatin  10 mg Oral Daily   heparin  5,000 Units Subcutaneous Q8H   insulin  aspart  0-9 Units Subcutaneous TID WC   multivitamin with minerals  1 tablet Oral Daily   sodium chloride flush  3 mL Intravenous Q12H   Continuous Infusions:  sodium chloride 250 mL (01/31/21 0729)   cefTRIAXone (ROCEPHIN)  IV Stopped (02/01/21 1625)   vancomycin 1,750 mg (02/02/21 0939)     LOS: 3 days    Time spent: 25 mins    Thy Gullikson, MD [facility]   If 7PM-7AM, please contact night-coverage

## 2021-02-03 ENCOUNTER — Other Ambulatory Visit: Payer: Self-pay | Admitting: *Deleted

## 2021-02-03 ENCOUNTER — Encounter (HOSPITAL_COMMUNITY)
Admission: EM | Disposition: A | Discharge status: Rehabilitation Facility | Payer: Self-pay | Source: Home / Self Care | Attending: Family Medicine

## 2021-02-03 ENCOUNTER — Inpatient Hospital Stay (HOSPITAL_COMMUNITY): Payer: Medicare Other | Admitting: Anesthesiology

## 2021-02-03 ENCOUNTER — Encounter (HOSPITAL_COMMUNITY): Payer: Self-pay | Admitting: Family Medicine

## 2021-02-03 DIAGNOSIS — M869 Osteomyelitis, unspecified: Secondary | ICD-10-CM | POA: Diagnosis not present

## 2021-02-03 DIAGNOSIS — I739 Peripheral vascular disease, unspecified: Secondary | ICD-10-CM

## 2021-02-03 HISTORY — PX: APPLICATION OF WOUND VAC: SHX5189

## 2021-02-03 HISTORY — PX: AMPUTATION: SHX166

## 2021-02-03 LAB — BASIC METABOLIC PANEL
Anion gap: 9 (ref 5–15)
BUN: 15 mg/dL (ref 8–23)
CO2: 22 mmol/L (ref 22–32)
Calcium: 9.2 mg/dL (ref 8.9–10.3)
Chloride: 104 mmol/L (ref 98–111)
Creatinine, Ser: 1.25 mg/dL — ABNORMAL HIGH (ref 0.61–1.24)
GFR, Estimated: >60 mL/min (ref >60)
Glucose, Bld: 122 mg/dL — ABNORMAL HIGH (ref 70–99)
Potassium: 4.5 mmol/L (ref 3.5–5.1)
Sodium: 135 mmol/L (ref 135–145)

## 2021-02-03 LAB — PHOSPHORUS: Phosphorus: 3.6 mg/dL (ref 2.5–4.6)

## 2021-02-03 LAB — GLUCOSE, CAPILLARY
Glucose-Capillary: 121 mg/dL — ABNORMAL HIGH (ref 70–99)
Glucose-Capillary: 158 mg/dL — ABNORMAL HIGH (ref 70–99)
Glucose-Capillary: 155 mg/dL — ABNORMAL HIGH (ref 70–99)
Glucose-Capillary: 142 mg/dL — ABNORMAL HIGH (ref 70–99)
Glucose-Capillary: 149 mg/dL — ABNORMAL HIGH (ref 70–99)
Glucose-Capillary: 149 mg/dL — ABNORMAL HIGH (ref 70–99)
Glucose-Capillary: 116 mg/dL — ABNORMAL HIGH (ref 70–99)
Glucose-Capillary: 165 mg/dL — ABNORMAL HIGH (ref 70–99)

## 2021-02-03 LAB — CBC
HCT: 29.6 % — ABNORMAL LOW (ref 39.0–52.0)
Hemoglobin: 9.4 g/dL — ABNORMAL LOW (ref 13.0–17.0)
MCH: 29.7 pg (ref 26.0–34.0)
MCHC: 31.8 g/dL (ref 30.0–36.0)
MCV: 93.7 fL (ref 80.0–100.0)
Platelets: 396 10*3/uL (ref 150–400)
RBC: 3.16 MIL/uL — ABNORMAL LOW (ref 4.22–5.81)
RDW: 12.6 % (ref 11.5–15.5)
WBC: 15.9 10*3/uL — ABNORMAL HIGH (ref 4.0–10.5)
nRBC: 0 % (ref 0.0–0.2)

## 2021-02-03 LAB — MAGNESIUM: Magnesium: 1.7 mg/dL (ref 1.7–2.4)

## 2021-02-03 SURGERY — AMPUTATION BELOW KNEE
Anesthesia: Regional | Site: Knee | Laterality: Left

## 2021-02-03 MED ORDER — OXYCODONE HCL 5 MG PO TABS
10.0000 mg | ORAL_TABLET | ORAL | Status: DC | PRN
Start: 2021-02-03 — End: 2021-02-04
  Administered 2021-02-03 – 2021-02-04 (×4): 10 mg via ORAL
  Filled 2021-02-03 (×5): qty 2

## 2021-02-03 MED ORDER — CHLORHEXIDINE GLUCONATE 0.12 % MT SOLN
OROMUCOSAL | Status: AC
  Administered 2021-02-03: 15 mL via OROMUCOSAL
  Filled 2021-02-03: qty 15

## 2021-02-03 MED ORDER — SODIUM CHLORIDE 0.9 % IV SOLN: INTRAVENOUS | Status: DC

## 2021-02-03 MED ORDER — HEPARIN SODIUM (PORCINE) 5000 UNIT/ML IJ SOLN: 5000.0000 [IU] | Freq: Three times a day (TID) | INTRAMUSCULAR | Status: DC

## 2021-02-03 MED ORDER — ACETAMINOPHEN 325 MG PO TABS: 650.0000 mg | ORAL_TABLET | Freq: Four times a day (QID) | ORAL | Status: DC | PRN

## 2021-02-03 MED ORDER — PROPOFOL 10 MG/ML IV BOLUS
INTRAVENOUS | Status: DC | PRN
  Administered 2021-02-03: 20 mg via INTRAVENOUS

## 2021-02-03 MED ORDER — PHENOL 1.4 % MT LIQD: 1.0000 | OROMUCOSAL | Status: DC | PRN

## 2021-02-03 MED ORDER — DOCUSATE SODIUM 100 MG PO CAPS
100.0000 mg | ORAL_CAPSULE | Freq: Every day | ORAL | Status: DC
  Administered 2021-02-04: 100 mg via ORAL
  Filled 2021-02-03 (×2): qty 1

## 2021-02-03 MED ORDER — HYDROMORPHONE HCL 1 MG/ML IJ SOLN
0.5000 mg | INTRAMUSCULAR | Status: DC | PRN
  Administered 2021-02-03 – 2021-02-04 (×2): 0.5 mg via INTRAVENOUS
  Filled 2021-02-03 (×2): qty 0.5

## 2021-02-03 MED ORDER — FENTANYL CITRATE (PF) 100 MCG/2ML IJ SOLN: 50.0000 ug | Freq: Once | INTRAMUSCULAR | Status: AC

## 2021-02-03 MED ORDER — ENSURE PRE-SURGERY PO LIQD
296.0000 mL | Freq: Once | ORAL | Status: AC
  Administered 2021-02-03: 296 mL via ORAL
  Filled 2021-02-03 (×2): qty 296

## 2021-02-03 MED ORDER — MAGNESIUM CITRATE PO SOLN
1.0000 | Freq: Once | ORAL | Status: DC | PRN
  Filled 2021-02-03: qty 296

## 2021-02-03 MED ORDER — BISACODYL 5 MG PO TBEC: 5.0000 mg | DELAYED_RELEASE_TABLET | Freq: Every day | ORAL | Status: DC | PRN

## 2021-02-03 MED ORDER — ZINC SULFATE 220 (50 ZN) MG PO CAPS
220.0000 mg | ORAL_CAPSULE | Freq: Every day | ORAL | Status: DC
  Administered 2021-02-03 – 2021-02-04 (×2): 220 mg via ORAL
  Filled 2021-02-03 (×2): qty 1

## 2021-02-03 MED ORDER — MIDAZOLAM HCL 2 MG/2ML IJ SOLN
INTRAMUSCULAR | Status: AC
  Administered 2021-02-03: 1 mg via INTRAVENOUS
  Filled 2021-02-03: qty 2

## 2021-02-03 MED ORDER — BUPIVACAINE-EPINEPHRINE (PF) 0.5% -1:200000 IJ SOLN
INTRAMUSCULAR | Status: DC | PRN
  Administered 2021-02-03: 15 mL via PERINEURAL

## 2021-02-03 MED ORDER — CHLORHEXIDINE GLUCONATE 0.12 % MT SOLN: 15.0000 mL | Freq: Once | OROMUCOSAL | Status: AC

## 2021-02-03 MED ORDER — ROPIVACAINE HCL 5 MG/ML IJ SOLN
INTRAMUSCULAR | Status: DC | PRN
  Administered 2021-02-03: 30 mL via PERINEURAL

## 2021-02-03 MED ORDER — TRANEXAMIC ACID-NACL 1000-0.7 MG/100ML-% IV SOLN: 1000.0000 mg | Freq: Once | INTRAVENOUS | Status: DC

## 2021-02-03 MED ORDER — OXYCODONE HCL 5 MG PO TABS: 5.0000 mg | ORAL_TABLET | ORAL | Status: DC | PRN

## 2021-02-03 MED ORDER — JUVEN PO PACK
1.0000 | PACK | Freq: Two times a day (BID) | ORAL | Status: DC
  Administered 2021-02-03 – 2021-02-04 (×3): 1 via ORAL
  Filled 2021-02-03 (×3): qty 1

## 2021-02-03 MED ORDER — PROPOFOL 500 MG/50ML IV EMUL
INTRAVENOUS | Status: DC | PRN
  Administered 2021-02-03: 100 ug/kg/min via INTRAVENOUS

## 2021-02-03 MED ORDER — MIDAZOLAM HCL 2 MG/2ML IJ SOLN: 1.0000 mg | Freq: Once | INTRAMUSCULAR | Status: AC

## 2021-02-03 MED ORDER — CLONIDINE HCL (ANALGESIA) 100 MCG/ML EP SOLN
EPIDURAL | Status: DC | PRN
  Administered 2021-02-03: 100 ug

## 2021-02-03 MED ORDER — LACTATED RINGERS IV SOLN: INTRAVENOUS | Status: DC

## 2021-02-03 MED ORDER — GUAIFENESIN-DM 100-10 MG/5ML PO SYRP: 15.0000 mL | ORAL_SOLUTION | ORAL | Status: DC | PRN

## 2021-02-03 MED ORDER — FENTANYL CITRATE (PF) 100 MCG/2ML IJ SOLN
INTRAMUSCULAR | Status: AC
  Administered 2021-02-03: 50 ug via INTRAVENOUS
  Filled 2021-02-03: qty 2

## 2021-02-03 MED ORDER — ACETAMINOPHEN 500 MG PO TABS
1000.0000 mg | ORAL_TABLET | Freq: Three times a day (TID) | ORAL | Status: DC
  Administered 2021-02-03 – 2021-02-04 (×3): 1000 mg via ORAL
  Filled 2021-02-03 (×3): qty 2

## 2021-02-03 MED ORDER — ASCORBIC ACID 500 MG PO TABS
1000.0000 mg | ORAL_TABLET | Freq: Every day | ORAL | Status: DC
  Administered 2021-02-03 – 2021-02-04 (×2): 1000 mg via ORAL
  Filled 2021-02-03 (×2): qty 2

## 2021-02-03 MED ORDER — 0.9 % SODIUM CHLORIDE (POUR BTL) OPTIME
TOPICAL | Status: DC | PRN
  Administered 2021-02-03: 1000 mL

## 2021-02-03 MED ORDER — POLYETHYLENE GLYCOL 3350 17 G PO PACK
17.0000 g | PACK | Freq: Every day | ORAL | Status: DC | PRN
  Administered 2021-02-03 – 2021-02-04 (×2): 17 g via ORAL
  Filled 2021-02-03 (×2): qty 1

## 2021-02-03 MED ORDER — ALUM & MAG HYDROXIDE-SIMETH 200-200-20 MG/5ML PO SUSP: 15.0000 mL | ORAL | Status: DC | PRN

## 2021-02-03 MED ORDER — ORAL CARE MOUTH RINSE: 15.0000 mL | Freq: Once | OROMUCOSAL | Status: AC

## 2021-02-03 MED ORDER — ONDANSETRON HCL 4 MG/2ML IJ SOLN
INTRAMUSCULAR | Status: DC | PRN
  Administered 2021-02-03: 4 mg via INTRAVENOUS

## 2021-02-03 SURGICAL SUPPLY — 39 items
BAG COUNTER SPONGE SURGICOUNT (BAG) IMPLANT
BAG SPNG CNTER NS LX DISP (BAG)
BLADE SAW RECIP 87.9 MT (BLADE) ×3 IMPLANT
BLADE SURG 21 STRL SS (BLADE) ×3 IMPLANT
BNDG COHESIVE 6X5 TAN STRL LF (GAUZE/BANDAGES/DRESSINGS) IMPLANT
CANISTER WOUND CARE 500ML ATS (WOUND CARE) ×3 IMPLANT
COVER SURGICAL LIGHT HANDLE (MISCELLANEOUS) ×3 IMPLANT
CUFF TOURN SGL QUICK 34 (TOURNIQUET CUFF) ×3
CUFF TRNQT CYL 34X4.125X (TOURNIQUET CUFF) ×2 IMPLANT
DRAPE DERMATAC (DRAPES) ×2 IMPLANT
DRAPE INCISE IOBAN 66X45 STRL (DRAPES) ×3 IMPLANT
DRAPE U-SHAPE 47X51 STRL (DRAPES) ×3 IMPLANT
DRESSING PREVENA PLUS CUSTOM (GAUZE/BANDAGES/DRESSINGS) ×2 IMPLANT
DRSG PREVENA PLUS CUSTOM (GAUZE/BANDAGES/DRESSINGS) ×3
DURAPREP 26ML APPLICATOR (WOUND CARE) ×3 IMPLANT
ELECT REM PT RETURN 9FT ADLT (ELECTROSURGICAL) ×3
ELECTRODE REM PT RTRN 9FT ADLT (ELECTROSURGICAL) ×2 IMPLANT
GLOVE SURG ORTHO LTX SZ9 (GLOVE) ×3 IMPLANT
GLOVE SURG UNDER POLY LF SZ9 (GLOVE) ×3 IMPLANT
GOWN STRL REUS W/ TWL XL LVL3 (GOWN DISPOSABLE) ×4 IMPLANT
GOWN STRL REUS W/TWL XL LVL3 (GOWN DISPOSABLE) ×6
KIT BASIN OR (CUSTOM PROCEDURE TRAY) ×3 IMPLANT
KIT TURNOVER KIT B (KITS) ×3 IMPLANT
MANIFOLD NEPTUNE II (INSTRUMENTS) ×3 IMPLANT
NS IRRIG 1000ML POUR BTL (IV SOLUTION) ×3 IMPLANT
PACK ORTHO EXTREMITY (CUSTOM PROCEDURE TRAY) ×3 IMPLANT
PAD ARMBOARD 7.5X6 YLW CONV (MISCELLANEOUS) ×3 IMPLANT
PREVENA RESTOR ARTHOFORM 33X30 (CANNISTER) ×1 IMPLANT
PREVENA RESTOR ARTHOFORM 46X30 (CANNISTER) ×2 IMPLANT
SPONGE T-LAP 18X18 ~~LOC~~+RFID (SPONGE) IMPLANT
STAPLER VISISTAT 35W (STAPLE) IMPLANT
STOCKINETTE IMPERVIOUS LG (DRAPES) ×3 IMPLANT
SUT ETHILON 2 0 PSLX (SUTURE) IMPLANT
SUT SILK 2 0 (SUTURE) ×3
SUT SILK 2-0 18XBRD TIE 12 (SUTURE) ×2 IMPLANT
SUT VIC AB 1 CTX 27 (SUTURE) ×6 IMPLANT
TOWEL GREEN STERILE (TOWEL DISPOSABLE) ×3 IMPLANT
TUBE CONNECTING 12X1/4 (SUCTIONS) ×3 IMPLANT
YANKAUER SUCT BULB TIP NO VENT (SUCTIONS) ×3 IMPLANT

## 2021-02-03 NOTE — TOC Initial Note (Signed)
Transition of Care [facility]) - Initial/Assessment Note    Patient Details  Name: Joseph [name] MRN: [id number] Date of Birth: 1946-01-30  [facility]) CM/SW Contact:    Epifanio Lesches, RN Phone Number: 02/03/2021, 8:40 AM  Clinical Narrative:                 Readmitted  with L great toe osteomyelitis. Pt declined surgical intervention with previous admit, 8/30-01/28/2021. From home. PTA independent with ADL's.       - plan: L BKA, 02/03/2021  TOC team following and will assist with TOC needs....  Expected Discharge Plan: Home w Home Health Services (vs SNF vs CIR) Barriers to Discharge: Continued Medical Work up   Patient Goals and CMS Choice        Expected Discharge Plan and Services Expected Discharge Plan: Home w Home Health Services (vs SNF vs CIR)                                              Prior Living Arrangements/Services                       Activities of Daily Living Home Assistive Devices/Equipment: Prosthesis, Wheelchair, Environmental consultant (specify type), Cane (specify quad or straight) ADL Screening (condition at time of admission) Patient's cognitive ability adequate to safely complete daily activities?: Yes Is the patient deaf or have difficulty hearing?: No Does the patient have difficulty seeing, even when wearing glasses/contacts?: No Does the patient have difficulty concentrating, remembering, or making decisions?: No Patient able to express need for assistance with ADLs?: Yes Does the patient have difficulty dressing or bathing?: No Independently performs ADLs?: Yes (appropriate for developmental age) Does the patient have difficulty walking or climbing stairs?: No Weakness of Legs: None Weakness of Arms/Hands: None  Permission Sought/Granted                  Emotional Assessment              Admission diagnosis:  Wet gangrene (HCC) [I96] Osteomyelitis of foot, unspecified laterality, unspecified type (HCC)  [M86.9] Osteomyelitis of great toe of left foot (HCC) [M86.9] Patient Active Problem List   Diagnosis Date Noted   Hyperlipidemia 01/30/2021   Anemia 01/30/2021   Osteomyelitis (HCC) 01/26/2021   Osteomyelitis of great toe of left foot (HCC)    Postoperative pain    AKI (acute kidney injury) (HCC)    Acute blood loss anemia    Chronic diastolic congestive heart failure (HCC)    Diabetic peripheral neuropathy (HCC)    CKD (chronic kidney disease), stage II    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)    Leukocytosis    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HCC) 11/20/2018   Paroxysmal atrial fibrillation (HCC)    Diabetic polyneuropathy associated with type 2 diabetes mellitus (HCC)    PVD (peripheral vascular disease) (HCC)    Critical lower limb ischemia (HCC)    Sepsis with acute renal failure (HCC) 11/13/2018   Acute renal failure superimposed on stage 2 chronic kidney disease (HCC)    'light-for-dates' infant with signs of fetal malnutrition 10/17/2018   DM (diabetes mellitus) (HCC) 07/02/2012   Hypertension associated with diabetes (HCC) 07/02/2012   PCP:  Hoy Register, MD Pharmacy:   [facility] [address] [place] [address] Phone: [phone]  Fax: [phone]  [id number] [place], [place] - [address] AT [facility] [address] [place]  [place] [address] Phone: [phone] Fax: [phone]     Social Determinants of Health (SDOH) Interventions    Readmission Risk Interventions No flowsheet data found.

## 2021-02-03 NOTE — Anesthesia Procedure Notes (Signed)
Anesthesia Regional Block: Adductor canal block   Pre-Anesthetic Checklist: , timeout performed,  Correct Patient, Correct Site, Correct Laterality,  Correct Procedure, Correct Position, site marked,  Risks and benefits discussed,  Surgical consent,  Pre-op evaluation,  At surgeon's request and post-op pain management  Laterality: Lower and Left  Prep: chloraprep       Needles:  Injection technique: Single-shot  Needle Type: Echogenic Needle     Needle Length: 9cm  Needle Gauge: 22     Additional Needles:   Procedures:,,,, ultrasound used (permanent image in chart),,    Narrative:  Start time: 02/03/2021 11:20 AM End time: 02/03/2021 11:25 AM Injection made incrementally with aspirations every 5 mL.  Performed by: Personally  Anesthesiologist: Trevor Iha, MD  Additional Notes: Block assessed prior to surgery. Pt tolerated procedure well.

## 2021-02-03 NOTE — Anesthesia Postprocedure Evaluation (Signed)
Anesthesia Post Note  Patient: Joseph [name]  Procedure(s) Performed: LEFT BELOW KNEE AMPUTATION (Left: Knee) APPLICATION OF WOUND VAC     Patient location during evaluation: PACU Anesthesia Type: Regional Level of consciousness: awake and alert Pain management: pain level controlled Vital Signs Assessment: post-procedure vital signs reviewed and stable Respiratory status: spontaneous breathing, nonlabored ventilation, respiratory function stable and patient connected to nasal cannula oxygen Cardiovascular status: stable and blood pressure returned to baseline Postop Assessment: no apparent nausea or vomiting Anesthetic complications: no   No notable events documented.  Last Vitals:  Vitals:   02/03/21 1459 02/03/21 1627  BP: 114/61 (!) 105/94  Pulse: 65 68  Resp: 16 17  Temp: (!) 36.3 C 37.2 C  SpO2: 97% 99%    Last Pain:  Vitals:   02/03/21 1700  TempSrc:   PainSc: 6                  Trevor Iha

## 2021-02-03 NOTE — Transfer of Care (Signed)
Immediate Anesthesia Transfer of Care Note  Patient: [name] Khatoon  Procedure(s) Performed: LEFT BELOW KNEE AMPUTATION (Left: Knee) APPLICATION OF WOUND VAC  Patient Location: PACU  Anesthesia Type:MAC and Regional  Level of Consciousness: awake, alert , oriented and patient cooperative  Airway & Oxygen Therapy: Patient Spontanous Breathing and Patient connected to face mask oxygen  Post-op Assessment: Report given to RN, Post -op Vital signs reviewed and stable and Patient moving all extremities X 4  Post vital signs: Reviewed and stable  Last Vitals:  Vitals Value Taken Time  BP 113/70 02/03/21 1242  Temp    Pulse 68 02/03/21 1243  Resp 14 02/03/21 1243  SpO2 100 % 02/03/21 1243  Vitals shown include unvalidated device data.  Last Pain:  Vitals:   02/03/21 1044  TempSrc: Oral  PainSc:       Patients Stated Pain Goal: 2 (37/94/44 6190)  Complications: No notable events documented.

## 2021-02-03 NOTE — Patient Outreach (Signed)
[facility]) Care Management  02/03/2021  [place] Mccarver 1945-06-16 [id number]   [facility] Care coordination- hospitalized patient   01/28/21 RN CM updated by [facility] hospital liaison of Recommendations for transtibial amputation surgery per Dr Lajoyce Corners. Pt had declined surgery and had stated he wanted to go home first "to organize his finances."   On 02/03/21 at scheduled outreach RN CM noted,  Joseph [name] was admitted on and remains inpatient since 01/30/21 for left below knee transtibial amputation with application of a Prevena wound vac per Dr Jonathon Bellows. He was admitted through the ED after being sent from Dr Duda's office to the ED when he arrived with gangrenous ulcer pain of the left great toe ED diagnosis of osteomyelitis of foot, wet gangrene PMH severe vascular disease with poor revascularization options, dry left foot gangrene, osteomyelitis of great left foot,  type 2 with diabetic peripheral neuropathy, right below knee amputation on 11/20/18   Plan RN CM continue to collaborate with [facility] hospital liaison and will follow up with patient within the next 7-10 business days- pending hospitalization   Shaeleigh Graw L. [name], RN, BSN, CCM [facility] Telephonic Care Management Care Coordinator Office number [phone] Mobile number [phone]  Main [facility] number [phone] Fax number [phone]

## 2021-02-03 NOTE — Progress Notes (Signed)
PROGRESS NOTE    Joseph [name]  PFX:[id number] DOB: [birth year] DOA: 01/30/2021 PCP: Hoy Register, MD   Chief Complaint  Patient presents with   Foot Pain   Brief Narrative:  This 75 y.o. male with PMH significant of diabetes mellitus, hypertension, CKD stage II, PAD, paroxysmal atrial fibrillation, left foot wound /osteomyelitis, history of right BKA. Patient presented for the reevaluation of left toe osteomyelitis and need for amputation. He was recently admitted but declined transtibial amputation at that time. Orthopedics is consulted and patient is scheduled to have transtibial amputation on Wednesday.  Assessment & Plan:   Principal Problem:   Osteomyelitis of great toe of left foot (HCC) Active Problems:   DM (diabetes mellitus) (HCC)   Hypertension associated with diabetes (HCC)   PVD (peripheral vascular disease) (HCC)   Paroxysmal atrial fibrillation (HCC)   Chronic diastolic congestive heart failure (HCC)   CKD (chronic kidney disease), stage II   Leukocytosis   Hyperlipidemia   Anemia  Osteomyelitis of left great toe: Plain films 9/3 with generalized soft tissue swelling, soft tissue defect in plantar/medial distal L first toe.  Loss of cortex compatible with erosive change throughout the medial/plantar aspect of the distal phalanx left first toe, similar to slightly worsened.  New mild erosive changes in the medial distal aspect of the proximal phalanx inn the L first toe.  Progression of acute osteo. S/p transtibial amputation and application of prevena wound vac by ortho Continue vancomycin and ceftriaxone for now Blood cultures no growth so far.   Leukocytosis : likely secondary to above.   Anemia of chronic disease: Repeat iron studies, B12, folate   Paroxysmal atrial fibrillation: Currently remains in NSR, heart rate controlled. Not on anticoagulation secondary to history of GI bleed Discontinue telemetry.   Chronic diastolic heart failure. Last TTE:  LVEF of 50-55%.  Appears euvolemic.   Type 2 diabetes: Hemoglobin A1c 9.4% . He uses Lantus 15 units daily at home. Regular insulin sliding scale.   CKD stage II: Baseline creatinine 1.2-1.3. Does not meet criteria for CKD stage IIIb.  Renal functions at baseline.   Essential hypertension : Continue amlodipine.    Peripheral arterial disease: Patient has been evaluated by vascular surgery with no options for revascularization.  On aspirin and Lipitor as an outpatient. Contributing to above infection / poor wound healing. Continue aspirin per orthopedic surgery recommendations.   Hyperlipidemia: Continue Lipitor.  DVT prophylaxis: heparin Code Status: full  Family Communication: none at bedside Disposition:   Status is: Inpatient  Remains inpatient appropriate because:Inpatient level of care appropriate due to severity of illness  Dispo: The patient is from: Home              Anticipated d/c is to: Home              Patient currently is not medically stable to d/c.   Difficult to place patient No       Consultants:  ortho  Procedures:  9/7 PROCEDURE: Transtibial amputation Application of Prevena wound VAC  8/31 PROCEDURE:   1) US guided right common femoral artery access 2) Aortogram 3) Left lower extremity angiogram with second order cannulation (80mL total contrast) 4) Conscious sedation (26 minutes) Plan -> no good options for limb salvage, pedal bypass may be technically achievable. Ischemia/tissue loss maybe too severe.  High risk for amputation.  Antimicrobials: Anti-infectives (From admission, onward)    Start     Dose/Rate Route Frequency Ordered Stop   02/03/21 0600  ceFAZolin (  ANCEF) IVPB 2g/100 mL premix        2 g 200 mL/hr over 30 Minutes Intravenous On call to O.R. 02/02/21 2033 02/03/21 1231   01/31/21 1600  cefTRIAXone (ROCEPHIN) 2 g in sodium chloride 0.9 % 100 mL IVPB       Note to Pharmacy: 24 hours from first dose in ER.   2 g 200  mL/hr over 30 Minutes Intravenous Every 24 hours 01/30/21 1508     01/30/21 1400  cefTRIAXone (ROCEPHIN) 2 g in sodium chloride 0.9 % 100 mL IVPB        2 g 200 mL/hr over 30 Minutes Intravenous  Once 01/30/21 1358 01/30/21 1902   01/30/21 1000  vancomycin (VANCOREADY) IVPB 1750 mg/350 mL        1,750 mg 175 mL/hr over 120 Minutes Intravenous Every 24 hours 01/30/21 0947            Subjective: Asking for something additinoal for pain  Objective: Vitals:   02/03/21 1321 02/03/21 1459 02/03/21 1627 02/03/21 1855  BP: 128/71 114/61 (!) 105/94 116/68  Pulse: 60 65 68 66  Resp: 16 16 17 16   Temp: 99 F (37.2 C) (!) 97.4 F (36.3 C) 99 F (37.2 C) 99.4 F (37.4 C)  TempSrc: Oral Oral Oral Oral  SpO2: 96% 97% 99% 97%  Weight:      Height:        Intake/Output Summary (Last 24 hours) at 02/03/2021 1948 Last data filed at 02/03/2021 1817 Gross per 24 hour  Intake 835.57 ml  Output 1170 ml  Net -334.43 ml   Filed Weights   01/30/21 1635 02/03/21 1044  Weight: 100 kg 100 kg    Examination:  General exam: Appears calm and comfortable  Respiratory system: Clear to auscultation. Respiratory effort normal. Cardiovascular system: S1 & S2 heard, RRR.  Gastrointestinal system: Abdomen is nondistended, soft and nontender.  Central nervous system: Alert and oriented. No focal neurological deficits. Extremities: s/p L transtibial amputation, R BKA Skin: No rashes, lesions or ulcers Psychiatry: Judgement and insight appear normal. Mood & affect appropriate.     Data Reviewed: I have personally reviewed following labs and imaging studies  CBC: Recent Labs  Lab 01/28/21 0225 01/30/21 0556 01/31/21 0115 02/02/21 0254 02/03/21 0057  WBC 15.4* 19.7* 14.6* 14.5* 15.9*  NEUTROABS 10.9* 12.0*  --   --   --   HGB 9.5* 10.0* 9.5* 9.4* 9.4*  HCT 29.1* 30.3* 29.7* 29.0* 29.6*  MCV 92.4 91.8 92.8 92.4 93.7  PLT 294 352 350 382 396    Basic Metabolic Panel: Recent Labs  Lab  01/28/21 0225 01/30/21 0556 01/31/21 0115 02/01/21 1104 02/03/21 0057  NA 134* 133* 136 137 135  K 4.1 3.9 4.3 4.0 4.5  CL 105 101 107 104 104  CO2 22 21* 22 24 22   GLUCOSE 153* 145* 90 154* 122*  BUN 16 20 17 15 15   CREATININE 1.26* 1.52* 1.21 1.26* 1.25*  CALCIUM 8.5* 9.3 8.9 9.3 9.2  MG  --   --   --   --  1.7  PHOS  --   --   --   --  3.6    GFR: Estimated Creatinine Clearance: 62.9 mL/min (Darrin Apodaca) (by C-G formula based on SCr of 1.25 mg/dL (H)).  Liver Function Tests: Recent Labs  Lab 01/30/21 0556  AST 31  ALT 21  ALKPHOS 62  BILITOT 1.1  PROT 7.8  ALBUMIN 3.1*    CBG: Recent Labs  Lab 02/03/21 1022 02/03/21 1045 02/03/21 1242 02/03/21 1331 02/03/21 1730  GLUCAP 155* 142* 149* 149* 116*     Recent Results (from the past 240 hour(s))  SARS CORONAVIRUS 2 (TAT 6-24 HRS) Nasopharyngeal Nasopharyngeal Swab     Status: None   Collection Time: 01/26/21  3:57 PM   Specimen: Nasopharyngeal Swab  Result Value Ref Range Status   SARS Coronavirus 2 NEGATIVE NEGATIVE Final    Comment: (NOTE) SARS-CoV-2 target nucleic acids are NOT DETECTED.  The SARS-CoV-2 RNA is generally detectable in upper and lower respiratory specimens during the acute phase of infection. Negative results do not preclude SARS-CoV-2 infection, do not rule out co-infections with other pathogens, and should not be used as the sole basis for treatment or other patient management decisions. Negative results must be combined with clinical observations, patient history, and epidemiological information. The expected result is Negative.  Fact Sheet for Patients: HairSlick.nohttps://www.fda.gov/media/138098/download  Fact Sheet for Healthcare Providers: quierodirigir.comhttps://www.fda.gov/media/138095/download  This test is not yet approved or cleared by the [place] FDA and  has been authorized for detection and/or diagnosis of SARS-CoV-2 by FDA under an Emergency Use Authorization (EUA). This EUA will remain  in  effect (meaning this test can be used) for the duration of the COVID-19 declaration under Se ction 564(b)(1) of the Act, 21 U.S.C. section 360bbb-3(b)(1), unless the authorization is terminated or revoked sooner.  Performed at [facility], [address] [place], [place] [address]   MRSA Next Gen by PCR, Nasal     Status: None   Collection Time: 01/27/21  8:10 AM   Specimen: Nasal Mucosa; Nasal Swab  Result Value Ref Range Status   MRSA by PCR Next Gen NOT DETECTED NOT DETECTED Final    Comment: (NOTE) The GeneXpert MRSA Assay (FDA approved for NASAL specimens only), is one component of Rani Idler comprehensive MRSA colonization surveillance program. It is not intended to diagnose MRSA infection nor to guide or monitor treatment for MRSA infections. Test performance is not FDA approved in patients less than 73 years old. Performed at [facility], [address] [place], [place] [address]   Blood culture (routine x 2)     Status: None (Preliminary result)   Collection Time: 01/30/21  5:56 AM   Specimen: BLOOD  Result Value Ref Range Status   Specimen Description BLOOD SITE NOT SPECIFIED  Final   Special Requests   Final    BOTTLES DRAWN AEROBIC AND ANAEROBIC Blood Culture adequate volume   Culture   Final    NO GROWTH 4 DAYS Performed at [facility], [address] [place], [place] [address]    Report Status PENDING  Incomplete  Blood culture (routine x 2)     Status: None (Preliminary result)   Collection Time: 01/30/21  5:56 AM   Specimen: BLOOD  Result Value Ref Range Status   Specimen Description BLOOD SITE NOT SPECIFIED  Final   Special Requests AEROBIC BOTTLE ONLY Blood Culture adequate volume  Final   Culture   Final    NO GROWTH 4 DAYS Performed at [facility], [address] [place], [place] [address]    Report Status PENDING  Incomplete         Radiology Studies: No results found.      Scheduled Meds:  acetaminophen  1,000 mg  Oral Q8H   amLODipine  10 mg Oral Daily   vitamin C  1,000 mg Oral Daily   atorvastatin  10 mg Oral Daily   [START ON 02/04/2021] docusate sodium  100 mg Oral Daily   [START ON 02/08/2021] heparin  5,000 Units Subcutaneous Q8H   insulin aspart  0-9 Units Subcutaneous TID WC   multivitamin with minerals  1 tablet Oral Daily   nutrition supplement (JUVEN)  1 packet Oral BID BM   sodium chloride flush  3 mL Intravenous Q12H   zinc sulfate  220 mg Oral Daily   Continuous Infusions:  sodium chloride 250 mL (01/31/21 0729)   sodium chloride 75 mL/hr at 02/03/21 1437   cefTRIAXone (ROCEPHIN)  IV 2 g (02/03/21 1735)   tranexamic acid     vancomycin 1,750 mg (02/03/21 1017)     LOS: 4 days    Time spent: over 30 min    Lacretia Nicks, MD [facility]   To contact the attending provider between 7A-7P or the covering provider during after hours 7P-7A, please log into the web site [email or web address] and access using universal [place] password for that web site. If you do not have the password, please call the hospital operator.  02/03/2021, 7:48 PM

## 2021-02-03 NOTE — Interval H&P Note (Signed)
History and Physical Interval Note:  02/03/2021 6:50 AM  Joseph [name]  has presented today for surgery, with the diagnosis of Gangrene Left Foot.  The various methods of treatment have been discussed with the patient and family. After consideration of risks, benefits and other options for treatment, the patient has consented to  Procedure(s): LEFT BELOW KNEE AMPUTATION (Left) as a surgical intervention.  The patient's history has been reviewed, patient examined, no change in status, stable for surgery.  I have reviewed the patient's chart and labs.  Questions were answered to the patient's satisfaction.     [name]

## 2021-02-03 NOTE — Evaluation (Signed)
Physical Therapy Evaluation Patient Details Name: Joseph [name] MRN: [id number] DOB: 1946-04-25 Today's Date: 02/03/2021   History of Present Illness  75 yo male s/p L BKA 02/03/21. PMH DM, HTN, R BKA  Clinical Impression  Pt received in bed, very motivated and pleasant- able to don R prosthetic with full independence, although he did require considerable assistance for functional transfers as well as external support with gait even with RW. Very motivated to improve and tells me he would love to return to CIR unit following this amputation- will make every effort but educated on possible limitations from insurance/CIR [facility] much more knowledgeable about this. Left in bed with all needs met, bed alarm active.     Follow Up Recommendations CIR;Supervision for mobility/OOB    Equipment Recommendations  Rolling walker with 5" wheels;3in1 (PT);Wheelchair (measurements PT);Wheelchair cushion (measurements PT)    Recommendations for Other Services       Precautions / Restrictions Precautions Precautions: Fall;Other (comment) Precaution Comments: new L BKA, chronic R BKA with prosthetic, wound vac Restrictions Weight Bearing Restrictions: Yes RLE Weight Bearing: Weight bearing as tolerated LLE Weight Bearing: Non weight bearing      Mobility  Bed Mobility Overal bed mobility: Needs Assistance Bed Mobility: Supine to Sit;Sit to Supine     Supine to sit: Supervision;HOB elevated Sit to supine: Supervision;HOB elevated   General bed mobility comments: HOB elevated, + rail    Transfers Overall transfer level: Needs assistance Equipment used: Rolling walker (2 wheeled) Transfers: Sit to/from Stand Sit to Stand: Mod assist;From elevated surface         General transfer comment: needed heavy modA even from significantly elevated surface, increased time and effort to steady  Ambulation/Gait Ambulation/Gait assistance: Min assist Gait Distance (Feet): 10 Feet Assistive device: Rolling  walker (2 wheeled) Gait Pattern/deviations:  (hop to pattern) Gait velocity: decreased   General Gait Details: hop to pattern with bilateral lateral unsteadiness requiring MinA to stabilize  Stairs            Wheelchair Mobility    Modified Rankin (Stroke Patients Only)       Balance Overall balance assessment: Needs assistance Sitting-balance support: Bilateral upper extremity supported;Feet supported Sitting balance-Leahy Scale: Good     Standing balance support: Bilateral upper extremity supported;During functional activity Standing balance-Leahy Scale: Poor Standing balance comment: reliant on BUE support and MinA for dynamic balance                             Pertinent Vitals/Pain Pain Assessment: No/denies pain    Home Living Family/patient expects to be discharged to:: Private residence Living Arrangements: Alone Available Help at Discharge: Family;Friend(s);Available PRN/intermittently;Other (Comment) (but has someone moving in who can live htere 24/7) Type of Home: House Home Access: Ramped entrance     Home Layout: Two level [place]: Bedside commode;Grab bars - toilet;Grab bars - tub/shower;Walker - 2 wheels;Cane - single point      Prior Function Level of Independence: Independent with assistive device(s)               Hand Dominance        Extremity/Trunk Assessment   Upper Extremity Assessment Upper Extremity Assessment: Defer to OT evaluation    Lower Extremity Assessment Lower Extremity Assessment: Generalized weakness    Cervical / Trunk Assessment Cervical / Trunk Assessment: Kyphotic  Communication   Communication: No difficulties  Cognition Arousal/Alertness: Awake/alert Behavior During Therapy: WFL for tasks assessed/performed  Overall Cognitive Status: Within Functional Limits for tasks assessed                                        General Comments      Exercises      Assessment/Plan    PT Assessment Patient needs continued PT services  PT Problem List Decreased strength;Decreased knowledge of use of DME;Decreased activity tolerance;Decreased safety awareness;Decreased balance;Decreased mobility;Decreased coordination       PT Treatment Interventions DME instruction;Balance training;Gait training;Stair training;Functional mobility training;Patient/family education;Therapeutic activities;Therapeutic exercise;Wheelchair mobility training    PT Goals (Current goals can be found in the Care Plan section)  Acute Rehab PT Goals Patient Stated Goal: back to rehab after this new amputation PT Goal Formulation: With patient Time For Goal Achievement: 02/17/21 Potential to Achieve Goals: Good    Frequency Min 4X/week   Barriers to discharge        Co-evaluation               AM-PAC PT "6 Clicks" Mobility  Outcome Measure Help needed turning from your back to your side while in a flat bed without using bedrails?: A Little Help needed moving from lying on your back to sitting on the side of a flat bed without using bedrails?: A Little Help needed moving to and from a bed to a chair (including a wheelchair)?: A Lot Help needed standing up from a chair using your arms (e.g., wheelchair or bedside chair)?: A Lot Help needed to walk in hospital room?: A Little Help needed climbing 3-5 steps with a railing? : Total 6 Click Score: 14    End of Session Equipment Utilized During Treatment: Gait belt Activity Tolerance: Patient tolerated treatment well Patient left: in bed;with call bell/phone within reach;with bed alarm set Nurse Communication: Mobility status PT Visit Diagnosis: Unsteadiness on feet (R26.81);Muscle weakness (generalized) (M62.81);Difficulty in walking, not elsewhere classified (R26.2)    Time: 0539-7673 PT Time Calculation (min) (ACUTE ONLY): 24 min   Charges:   PT Evaluation $PT Eval Moderate Complexity: 1 Mod PT  Treatments $Gait Training: 8-22 mins       Windell Norfolk, DPT, PN2   Supplemental Physical Therapist [place]    Pager [phone] Acute Rehab Office [phone]

## 2021-02-03 NOTE — Progress Notes (Signed)
Orthopedic Tech Progress Note Patient Details:  Joseph [name] 1946/03/13 [id number]  Called in order to [employer] for a VIVE PROTOCOL BK   Patient ID: Joseph [name], male   DOB: 1945-07-31, 75 y.o.   MRN: [id number]  Donald Pore 02/03/2021, 3:11 PM

## 2021-02-03 NOTE — Care Management Important Message (Signed)
Important Message  Patient Details  Name: Joseph [name] MRN: [id number] Date of Birth: 1945-12-09   Medicare Important Message Given:  Yes     Irvine Glorioso Stefan Church 02/03/2021, 8:26 AM

## 2021-02-03 NOTE — Plan of Care (Signed)

## 2021-02-03 NOTE — TOC Progression Note (Signed)
Transition of Care [facility]) - Initial/Assessment Note    Patient Details  Name: Joseph [name] MRN: [id number] Date of Birth: 06-22-1945  Transition of Care [facility]) CM/SW Contact:    Ralene Bathe, LCSWA Phone Number: 02/03/2021, 5:14 PM  Clinical Narrative:                 TOC following patient for any d/c planning needs once medically stable if CIR denies.   Cleon Gustin, MSW, LCSWA   Expected Discharge Plan: Home w Home Health Services (vs SNF vs CIR) Barriers to Discharge: Continued Medical Work up   Patient Goals and CMS Choice        Expected Discharge Plan and Services Expected Discharge Plan: Home w Home Health Services (vs SNF vs CIR)                                              Prior Living Arrangements/Services                       Activities of Daily Living Home Assistive Devices/Equipment: Prosthesis, Wheelchair, Environmental consultant (specify type), Cane (specify quad or straight) ADL Screening (condition at time of admission) Patient's cognitive ability adequate to safely complete daily activities?: Yes Is the patient deaf or have difficulty hearing?: No Does the patient have difficulty seeing, even when wearing glasses/contacts?: No Does the patient have difficulty concentrating, remembering, or making decisions?: No Patient able to express need for assistance with ADLs?: Yes Does the patient have difficulty dressing or bathing?: No Independently performs ADLs?: Yes (appropriate for developmental age) Does the patient have difficulty walking or climbing stairs?: No Weakness of Legs: None Weakness of Arms/Hands: None  Permission Sought/Granted                  Emotional Assessment              Admission diagnosis:  Wet gangrene (HCC) [I96] Osteomyelitis of foot, unspecified laterality, unspecified type (HCC) [M86.9] Osteomyelitis of great toe of left foot (HCC) [M86.9] Patient Active Problem List   Diagnosis Date Noted    Hyperlipidemia 01/30/2021   Anemia 01/30/2021   Osteomyelitis (HCC) 01/26/2021   Osteomyelitis of great toe of left foot (HCC)    Postoperative pain    AKI (acute kidney injury) (HCC)    Acute blood loss anemia    Chronic diastolic congestive heart failure (HCC)    Diabetic peripheral neuropathy (HCC)    CKD (chronic kidney disease), stage II    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)    Leukocytosis    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HCC) 11/20/2018   Paroxysmal atrial fibrillation (HCC)    Diabetic polyneuropathy associated with type 2 diabetes mellitus (HCC)    PVD (peripheral vascular disease) (HCC)    Critical lower limb ischemia (HCC)    Sepsis with acute renal failure (HCC) 11/13/2018   Acute renal failure superimposed on stage 2 chronic kidney disease (HCC)    'light-for-dates' infant with signs of fetal malnutrition 10/17/2018   DM (diabetes mellitus) (HCC) 07/02/2012   Hypertension associated with diabetes (HCC) 07/02/2012   PCP:  Hoy Register, MD Pharmacy:   [facility] [address] [place] [address] Phone: [phone] Fax: [phone]  [id number] [place], [place] - [address] AT [facility] [address]  [place] [address] Phone: [phone] Fax: [phone]     Social Determinants of Health (SDOH) Interventions    Readmission Risk Interventions No flowsheet data found.

## 2021-02-03 NOTE — Anesthesia Procedure Notes (Signed)
Anesthesia Regional Block: Popliteal block   Pre-Anesthetic Checklist: , timeout performed,  Correct Patient, Correct Site, Correct Laterality,  Correct Procedure, Correct Position, site marked,  Risks and benefits discussed,  Pre-op evaluation,  At surgeon's request and post-op pain management  Laterality: Lower and Left  Prep: Maximum Sterile Barrier Precautions used, chloraprep       Needles:  Injection technique: Single-shot  Needle Type: Echogenic Needle     Needle Length: 9cm  Needle Gauge: 21     Additional Needles:   Procedures:,,,, ultrasound used (permanent image in chart),,    Narrative:  Start time: 02/03/2021 11:13 AM End time: 02/03/2021 11:19 AM Injection made incrementally with aspirations every 5 mL.  Performed by: Personally  Anesthesiologist: Trevor Iha, MD  Additional Notes: Block assessed. Patient tolerated procedure well.

## 2021-02-03 NOTE — Op Note (Signed)
   Date of Surgery: 02/03/2021  INDICATIONS: Mr. Bazzi is a 75 y.o.-year-old male who has severe peripheral vascular disease.  Patient has undergone endovascular evaluation and does not have revascularization options.  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  PROCEDURE: Transtibial amputation Application of Prevena wound VAC  SURGEON: Lajoyce Corners, M.D.  ANESTHESIA:  general  IV FLUIDS AND URINE: See anesthesia records.  ESTIMATED BLOOD LOSS: See anesthesia records.  COMPLICATIONS: None.  DESCRIPTION OF PROCEDURE: The patient was brought to the operating room after undergoing regional anesthetic. After adequate levels of anesthesia were obtained patient's lower extremity was prepped using DuraPrep draped into a sterile field. A timeout was called. The foot was draped out of the sterile field with impervious stockinette. A transverse incision was made 11 cm distal to the tibial tubercle. This curved proximally and a large posterior flap was created. The tibia was transected 1 cm proximal to the skin incision. The fibula was transected just proximal to the tibial incision. The tibia was beveled anteriorly. A large posterior flap was created. The sciatic nerve was pulled cut and allowed to retract. The vascular bundles were suture ligated with 2-0 silk. The deep and superficial fascial layers were closed using #1 Vicryl. The skin was closed using staples and 2-0 nylon. The wound was covered with a Prevena customizable and arthroform wound VAC.  The dressing was sealed with dermatac there was a good suction fit. A prosthetic shrinker and limb protector were applied. Patient was taken to the PACU in stable condition.   DISCHARGE PLANNING:  Antibiotic duration: 24 hours  Weightbearing: Nonweightbearing on the operative  extremity  Pain medication: Opioid pathway  Dressing care/ Wound VAC: Continue wound VAC for 1 week after discharge  Discharge to: Discharge planning based on therapy's recommendations for possible inpatient rehabilitation, outpatient rehabilitation, or discharge to home with therapy  Follow-up: In the office 1 week post operative.  Aldean Baker, MD [facility] 12:40 PM

## 2021-02-04 ENCOUNTER — Other Ambulatory Visit: Payer: Self-pay

## 2021-02-04 ENCOUNTER — Encounter (HOSPITAL_COMMUNITY): Payer: Self-pay | Admitting: Orthopedic Surgery

## 2021-02-04 ENCOUNTER — Inpatient Hospital Stay (HOSPITAL_COMMUNITY)
Admission: RE | Admit: 2021-02-04 | Discharge: 2021-02-15 | DRG: 560 | Disposition: A | Discharge status: Home Health | Payer: Medicare Other | Source: Intra-hospital | Attending: Physical Medicine and Rehabilitation | Admitting: Physical Medicine and Rehabilitation

## 2021-02-04 DIAGNOSIS — E669 Obesity, unspecified: Secondary | ICD-10-CM | POA: Diagnosis not present

## 2021-02-04 DIAGNOSIS — Z794 Long term (current) use of insulin: Secondary | ICD-10-CM

## 2021-02-04 DIAGNOSIS — I130 Hypertensive heart and chronic kidney disease with heart failure and stage 1 through stage 4 chronic kidney disease, or unspecified chronic kidney disease: Secondary | ICD-10-CM | POA: Diagnosis present

## 2021-02-04 DIAGNOSIS — D72829 Elevated white blood cell count, unspecified: Secondary | ICD-10-CM | POA: Diagnosis not present

## 2021-02-04 DIAGNOSIS — R7309 Other abnormal glucose: Secondary | ICD-10-CM

## 2021-02-04 DIAGNOSIS — Z89512 Acquired absence of left leg below knee: Secondary | ICD-10-CM | POA: Diagnosis not present

## 2021-02-04 DIAGNOSIS — I739 Peripheral vascular disease, unspecified: Secondary | ICD-10-CM | POA: Diagnosis not present

## 2021-02-04 DIAGNOSIS — N1832 Chronic kidney disease, stage 3b: Secondary | ICD-10-CM

## 2021-02-04 DIAGNOSIS — D62 Acute posthemorrhagic anemia: Secondary | ICD-10-CM | POA: Diagnosis present

## 2021-02-04 DIAGNOSIS — Z7982 Long term (current) use of aspirin: Secondary | ICD-10-CM

## 2021-02-04 DIAGNOSIS — E1165 Type 2 diabetes mellitus with hyperglycemia: Secondary | ICD-10-CM

## 2021-02-04 DIAGNOSIS — K5901 Slow transit constipation: Secondary | ICD-10-CM | POA: Diagnosis not present

## 2021-02-04 DIAGNOSIS — I10 Essential (primary) hypertension: Secondary | ICD-10-CM

## 2021-02-04 DIAGNOSIS — N182 Chronic kidney disease, stage 2 (mild): Secondary | ICD-10-CM | POA: Diagnosis present

## 2021-02-04 DIAGNOSIS — Z79899 Other long term (current) drug therapy: Secondary | ICD-10-CM | POA: Diagnosis not present

## 2021-02-04 DIAGNOSIS — Z89511 Acquired absence of right leg below knee: Secondary | ICD-10-CM

## 2021-02-04 DIAGNOSIS — K5900 Constipation, unspecified: Secondary | ICD-10-CM | POA: Diagnosis present

## 2021-02-04 DIAGNOSIS — E1142 Type 2 diabetes mellitus with diabetic polyneuropathy: Secondary | ICD-10-CM | POA: Diagnosis present

## 2021-02-04 DIAGNOSIS — Z79890 Hormone replacement therapy (postmenopausal): Secondary | ICD-10-CM

## 2021-02-04 DIAGNOSIS — Z4781 Encounter for orthopedic aftercare following surgical amputation: Secondary | ICD-10-CM | POA: Diagnosis not present

## 2021-02-04 DIAGNOSIS — E785 Hyperlipidemia, unspecified: Secondary | ICD-10-CM | POA: Diagnosis present

## 2021-02-04 DIAGNOSIS — E1169 Type 2 diabetes mellitus with other specified complication: Secondary | ICD-10-CM | POA: Diagnosis not present

## 2021-02-04 DIAGNOSIS — Z833 Family history of diabetes mellitus: Secondary | ICD-10-CM | POA: Diagnosis not present

## 2021-02-04 DIAGNOSIS — E1151 Type 2 diabetes mellitus with diabetic peripheral angiopathy without gangrene: Secondary | ICD-10-CM | POA: Diagnosis present

## 2021-02-04 DIAGNOSIS — E1122 Type 2 diabetes mellitus with diabetic chronic kidney disease: Secondary | ICD-10-CM | POA: Diagnosis present

## 2021-02-04 DIAGNOSIS — I5032 Chronic diastolic (congestive) heart failure: Secondary | ICD-10-CM | POA: Diagnosis present

## 2021-02-04 LAB — GLUCOSE, CAPILLARY
Glucose-Capillary: 146 mg/dL — ABNORMAL HIGH (ref 70–99)
Glucose-Capillary: 181 mg/dL — ABNORMAL HIGH (ref 70–99)
Glucose-Capillary: 155 mg/dL — ABNORMAL HIGH (ref 70–99)
Glucose-Capillary: 128 mg/dL — ABNORMAL HIGH (ref 70–99)
Glucose-Capillary: 151 mg/dL — ABNORMAL HIGH (ref 70–99)

## 2021-02-04 LAB — CBC WITH DIFFERENTIAL/PLATELET
Abs Immature Granulocytes: 0 10*3/uL (ref 0.00–0.07)
Basophils Absolute: 0 10*3/uL (ref 0.0–0.1)
Basophils Relative: 0 %
Eosinophils Absolute: 0.5 10*3/uL (ref 0.0–0.5)
Eosinophils Relative: 3 %
HCT: 28.6 % — ABNORMAL LOW (ref 39.0–52.0)
Hemoglobin: 9.3 g/dL — ABNORMAL LOW (ref 13.0–17.0)
Lymphocytes Relative: 34 %
Lymphs Abs: 5.6 10*3/uL — ABNORMAL HIGH (ref 0.7–4.0)
MCH: 30.1 pg (ref 26.0–34.0)
MCHC: 32.5 g/dL (ref 30.0–36.0)
MCV: 92.6 fL (ref 80.0–100.0)
Monocytes Absolute: 0.2 10*3/uL (ref 0.1–1.0)
Monocytes Relative: 1 %
Neutro Abs: 10.2 10*3/uL — ABNORMAL HIGH (ref 1.7–7.7)
Neutrophils Relative %: 62 %
Platelets: 398 10*3/uL (ref 150–400)
RBC: 3.09 MIL/uL — ABNORMAL LOW (ref 4.22–5.81)
RDW: 12.7 % (ref 11.5–15.5)
Smear Review: NORMAL
WBC: 16.4 10*3/uL — ABNORMAL HIGH (ref 4.0–10.5)
nRBC: 0 % (ref 0.0–0.2)

## 2021-02-04 LAB — COMPREHENSIVE METABOLIC PANEL
ALT: 59 U/L — ABNORMAL HIGH (ref 0–44)
AST: 83 U/L — ABNORMAL HIGH (ref 15–41)
Albumin: 2.5 g/dL — ABNORMAL LOW (ref 3.5–5.0)
Alkaline Phosphatase: 63 U/L (ref 38–126)
Anion gap: 7 (ref 5–15)
BUN: 21 mg/dL (ref 8–23)
CO2: 24 mmol/L (ref 22–32)
Calcium: 9.1 mg/dL (ref 8.9–10.3)
Chloride: 102 mmol/L (ref 98–111)
Creatinine, Ser: 1.3 mg/dL — ABNORMAL HIGH (ref 0.61–1.24)
GFR, Estimated: 58 mL/min — ABNORMAL LOW (ref >60)
Glucose, Bld: 160 mg/dL — ABNORMAL HIGH (ref 70–99)
Potassium: 4.9 mmol/L (ref 3.5–5.1)
Sodium: 133 mmol/L — ABNORMAL LOW (ref 135–145)
Total Bilirubin: 0.2 mg/dL — ABNORMAL LOW (ref 0.3–1.2)
Total Protein: 6.9 g/dL (ref 6.5–8.1)

## 2021-02-04 LAB — FERRITIN: Ferritin: 465 ng/mL — ABNORMAL HIGH (ref 24–336)

## 2021-02-04 LAB — MAGNESIUM: Magnesium: 1.8 mg/dL (ref 1.7–2.4)

## 2021-02-04 LAB — IRON AND TIBC
Iron: 15 ug/dL — ABNORMAL LOW (ref 45–182)
Saturation Ratios: 7 % — ABNORMAL LOW (ref 17.9–39.5)
TIBC: 202 ug/dL — ABNORMAL LOW (ref 250–450)
UIBC: 187 ug/dL

## 2021-02-04 LAB — PHOSPHORUS: Phosphorus: 3.4 mg/dL (ref 2.5–4.6)

## 2021-02-04 LAB — CULTURE, BLOOD (ROUTINE X 2)
Culture: NO GROWTH
Culture: NO GROWTH
Special Requests: ADEQUATE
Special Requests: ADEQUATE

## 2021-02-04 LAB — FOLATE: Folate: 22.2 ng/mL (ref >5.9)

## 2021-02-04 LAB — VITAMIN B12: Vitamin B-12: 568 pg/mL (ref 180–914)

## 2021-02-04 MED ORDER — JUVEN PO PACK
1.0000 | PACK | Freq: Two times a day (BID) | ORAL | Status: DC
  Administered 2021-02-05 – 2021-02-13 (×16): 1 via ORAL
  Filled 2021-02-04 (×13): qty 1

## 2021-02-04 MED ORDER — OXYCODONE HCL 5 MG PO TABS
10.0000 mg | ORAL_TABLET | ORAL | Status: DC | PRN
  Administered 2021-02-04 – 2021-02-15 (×31): 10 mg via ORAL
  Filled 2021-02-04 (×31): qty 2

## 2021-02-04 MED ORDER — AMLODIPINE BESYLATE 10 MG PO TABS
10.0000 mg | ORAL_TABLET | Freq: Every day | ORAL | Status: DC
  Administered 2021-02-05 – 2021-02-15 (×11): 10 mg via ORAL
  Filled 2021-02-04 (×11): qty 1

## 2021-02-04 MED ORDER — FERROUS SULFATE 325 (65 FE) MG PO TABS
325.0000 mg | ORAL_TABLET | Freq: Every day | ORAL | Status: DC
  Administered 2021-02-05 – 2021-02-15 (×11): 325 mg via ORAL
  Filled 2021-02-04 (×11): qty 1

## 2021-02-04 MED ORDER — ZINC SULFATE 220 (50 ZN) MG PO CAPS
220.0000 mg | ORAL_CAPSULE | Freq: Every day | ORAL | Status: DC
  Administered 2021-02-05 – 2021-02-15 (×11): 220 mg via ORAL
  Filled 2021-02-04 (×11): qty 1

## 2021-02-04 MED ORDER — HEPARIN SODIUM (PORCINE) 5000 UNIT/ML IJ SOLN
5000.0000 [IU] | Freq: Three times a day (TID) | INTRAMUSCULAR | Status: DC
  Administered 2021-02-08 – 2021-02-15 (×21): 5000 [IU] via SUBCUTANEOUS
  Filled 2021-02-04 (×21): qty 1

## 2021-02-04 MED ORDER — HEPARIN SODIUM (PORCINE) 5000 UNIT/ML IJ SOLN: 5000.0000 [IU] | Freq: Three times a day (TID) | INTRAMUSCULAR | Status: DC

## 2021-02-04 MED ORDER — ADULT MULTIVITAMIN W/MINERALS CH
1.0000 | ORAL_TABLET | Freq: Every day | ORAL | Status: DC
  Administered 2021-02-05 – 2021-02-15 (×11): 1 via ORAL
  Filled 2021-02-04 (×11): qty 1

## 2021-02-04 MED ORDER — OXYCODONE HCL 5 MG PO TABS
5.0000 mg | ORAL_TABLET | ORAL | Status: DC | PRN
  Filled 2021-02-04: qty 1

## 2021-02-04 MED ORDER — ASCORBIC ACID 500 MG PO TABS
1000.0000 mg | ORAL_TABLET | Freq: Every day | ORAL | Status: DC
  Administered 2021-02-05 – 2021-02-15 (×11): 1000 mg via ORAL
  Filled 2021-02-04 (×11): qty 2

## 2021-02-04 MED ORDER — ASPIRIN EC 81 MG PO TBEC
81.0000 mg | DELAYED_RELEASE_TABLET | Freq: Every day | ORAL | Status: DC
  Administered 2021-02-04 – 2021-02-15 (×12): 81 mg via ORAL
  Filled 2021-02-04 (×12): qty 1

## 2021-02-04 MED ORDER — ATORVASTATIN CALCIUM 10 MG PO TABS
10.0000 mg | ORAL_TABLET | Freq: Every day | ORAL | Status: DC
  Administered 2021-02-05 – 2021-02-15 (×11): 10 mg via ORAL
  Filled 2021-02-04 (×11): qty 1

## 2021-02-04 MED ORDER — POLYETHYLENE GLYCOL 3350 17 G PO PACK
17.0000 g | PACK | Freq: Every day | ORAL | Status: DC | PRN
  Administered 2021-02-05: 17 g via ORAL
  Filled 2021-02-04: qty 1

## 2021-02-04 MED ORDER — BISACODYL 5 MG PO TBEC: 5.0000 mg | DELAYED_RELEASE_TABLET | Freq: Every day | ORAL | Status: DC | PRN

## 2021-02-04 MED ORDER — DOCUSATE SODIUM 100 MG PO CAPS
100.0000 mg | ORAL_CAPSULE | Freq: Every day | ORAL | Status: DC
  Administered 2021-02-05 – 2021-02-07 (×3): 100 mg via ORAL
  Filled 2021-02-04 (×3): qty 1

## 2021-02-04 MED ORDER — FERROUS SULFATE 325 (65 FE) MG PO TABS: 325.0000 mg | ORAL_TABLET | Freq: Every day | ORAL | 1 refills | Status: DC

## 2021-02-04 MED ORDER — ASPIRIN EC 81 MG PO TBEC
81.0000 mg | DELAYED_RELEASE_TABLET | Freq: Every day | ORAL | 2 refills | Status: DC
  Filled 2021-02-04: qty 150, 150d supply, fill #0

## 2021-02-04 MED ORDER — INSULIN ASPART 100 UNIT/ML IJ SOLN
0.0000 [IU] | Freq: Three times a day (TID) | INTRAMUSCULAR | Status: DC
Start: 2021-02-05 — End: 2021-02-15
  Administered 2021-02-05: 2 [IU] via SUBCUTANEOUS
  Administered 2021-02-05 – 2021-02-06 (×3): 1 [IU] via SUBCUTANEOUS
  Administered 2021-02-06 – 2021-02-07 (×2): 2 [IU] via SUBCUTANEOUS
  Administered 2021-02-07 – 2021-02-08 (×4): 1 [IU] via SUBCUTANEOUS
  Administered 2021-02-09 – 2021-02-11 (×4): 2 [IU] via SUBCUTANEOUS
  Administered 2021-02-11: 1 [IU] via SUBCUTANEOUS
  Administered 2021-02-12: 2 [IU] via SUBCUTANEOUS
  Administered 2021-02-13 (×3): 1 [IU] via SUBCUTANEOUS
  Administered 2021-02-14: 2 [IU] via SUBCUTANEOUS

## 2021-02-04 MED ORDER — INSULIN GLARGINE-YFGN 100 UNIT/ML ~~LOC~~ SOLN
15.0000 [IU] | Freq: Every day | SUBCUTANEOUS | Status: DC
  Administered 2021-02-04 – 2021-02-15 (×12): 15 [IU] via SUBCUTANEOUS
  Filled 2021-02-04 (×12): qty 0.15

## 2021-02-04 NOTE — Progress Notes (Signed)
Inpatient Rehabilitation Medication Review by a Pharmacist  A complete drug regimen review was completed for this patient to identify any potential clinically significant medication issues.  High Risk Drug Classes Is patient taking? Indication by Medication  Antipsychotic No   Anticoagulant Yes Heparin sq for VTE px  Antibiotic No Completed therapy   Opioid Yes Oxycodone for pain  Antiplatelet Yes ASA for PAD  Hypoglycemics/insulin Yes Insulin glargine/SSI for BG control  Vasoactive Medication Yes Amlodipine for HTN  Chemotherapy No   Other No      Type of Medication Issue Identified Description of Issue Recommendation(s)  Drug Interaction(s) (clinically significant)     Duplicate Therapy     Allergy     No Medication Administration End Date     Incorrect Dose     Additional Drug Therapy Needed  Mupirocin and diclofenac ordered on discharge, not order for CIR Please evaluate whether needed for CIR admit.   Significant med changes from prior encounter (inform family/care partners about these prior to discharge).    Other       Clinically significant medication issues were identified that warrant physician communication and completion of prescribed/recommended actions by midnight of the next day:  No  Name of provider notified for urgent issues identified:   Provider Method of Notification:     Pharmacist comments:   Time spent performing this drug regimen review (minutes):  10   Gesenia Bantz A. Jeanella Craze, PharmD, BCPS, FNKF Clinical Pharmacist [employer] [facility] Please utilize Amion for appropriate phone number to reach the unit pharmacist [facility])  02/04/2021 6:42 PM

## 2021-02-04 NOTE — Progress Notes (Signed)
OT Cancellation Note  Patient Details Name: Joseph [name] MRN: [id number] DOB: 1946/01/20   Cancelled Treatment:    Reason Eval/Treat Not Completed: Fatigue/lethargy limiting ability to participate (Pt planning to d/c to CIR today, just completed PT session and reported he was very fatigued and kindly declined OT.) Will return as schedule allows.  Tysean Vandervliet 02/04/2021, 3:42 PM  Prudence Davidson, OTS Acute Rehab Office: [phone]

## 2021-02-04 NOTE — H&P (Signed)
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  Per chart review patient lives alone.  Two-level home ramped entrance.  Independent with assistive device using prosthesis.  He reports he has someone moving in with him to help assist.  Presented 01/30/2021 with osteomyelitis of the left great toe followed by Dr. Sharol Given with essentially no circulation from the ankle distally.  His dorsalis pedis ABI is 0.2 posterior tibial ABI 0.  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 Sharol Given.  Wound VAC applied as directed.  Hospital course acute blood loss anemia 9.3, leukocytosis 16,400 felt to be reactive after surgery.  Noted history of CKD stage II creatinine 1.30.  Subcutaneous heparin for DVT prophylaxis.  Therapy evaluations completed due to patient decreased functional mobility was admitted for a comprehensive rehab program.  \Pt reports pain is ~ 28/10- was 10/10 earlier, but meds kicked in.  LBM 5 days ago- doesn't want BM until can use BSC.   Voiding well.  Passing gas.  Loves poetry.   Review of Systems  Constitutional:  Negative for chills and fever.  Eyes:  Negative for blurred vision and double vision.  Respiratory:  Negative for cough and shortness of breath.   Cardiovascular:  Positive for leg swelling. Negative for chest pain and palpitations.  Gastrointestinal:  Positive for constipation. Negative for heartburn, nausea and vomiting.  Genitourinary:  Positive for urgency. Negative for dysuria, flank pain and hematuria.  Musculoskeletal:  Positive for joint pain and myalgias.  Skin:  Negative for rash.  All other systems reviewed and are negative. Past Medical  History:  Diagnosis Date   Cellulitis of right lower extremity    Diabetes mellitus type 2 in nonobese [facility])    Gangrene of right foot ([place])    Hypertension    Open wound of right foot    Past Surgical History:  Procedure Laterality Date   ABDOMINAL AORTOGRAM W/LOWER EXTREMITY Left 01/27/2021   Procedure: ABDOMINAL AORTOGRAM W/LOWER EXTREMITY;  Surgeon: Cherre Robins, MD;  Location: [facility];  Service: Cardiovascular;  Laterality: Left;   AMPUTATION Right 11/16/2018   Procedure: RIGHT BELOW KNEE AMPUTATION;  Surgeon: Newt Minion, MD;  Location: [place];  Service: Orthopedics;  Laterality: Right;   AMPUTATION Left 02/03/2021   Procedure: LEFT BELOW KNEE AMPUTATION;  Surgeon: Newt Minion, MD;  Location: [place];  Service: Orthopedics;  Laterality: Left;   APPLICATION OF WOUND VAC  02/03/2021   Procedure: APPLICATION OF WOUND VAC;  Surgeon: Newt Minion, MD;  Location: [facility];  Service: Orthopedics;;   HAND SURGERY     Family History  Problem Relation Age of Onset   Diabetes Father    Cancer Neg Hx    CAD Neg Hx    Social History:  reports that he has never smoked. He has never used smokeless tobacco. He reports current alcohol use. He reports that he does not use drugs. Allergies: No Known Allergies Medications Prior to Admission  Medication Sig Dispense Refill   Accu-Chek FastClix Lancets MISC Use to check blood sugars three times per day (Patient taking differently: 1 each by Other route See admin instructions. Use to check blood sugars three times  per day) 100 each 11   amLODipine (NORVASC) 10 MG tablet Take 1 tablet (10 mg total) by mouth daily. 90 tablet 3   aspirin EC 81 MG tablet Take 81 mg by mouth daily. Swallow whole.     glipiZIDE (GLUCOTROL) 10 MG tablet TAKE 1 TABLET (10 MG TOTAL) BY MOUTH 2 (TWO) TIMES DAILY BEFORE A MEAL. (Patient taking differently: Take 10 mg by mouth 2 (two) times daily before a meal.) 60 tablet 2   glucose blood (ACCU-CHEK GUIDE) test  strip Use as instructed (Patient taking differently: 1 each by Other route as directed. Use as instructed) 100 each 12   Insulin Glargine (BASAGLAR KWIKPEN) 100 UNIT/ML Inject 15 Units into the skin daily. 15 mL 3   Insulin Pen Needle 31G X 5 MM MISC use one pen needle once daily. 100 each 1   Lancets Misc. (ACCU-CHEK FASTCLIX LANCET) KIT Use when checking blood sugars three times daily (Patient taking differently: 1 each by Other route See admin instructions. Use when checking blood sugars three times daily) 1 kit 11   Multiple Vitamins-Minerals (ONE-A-DAY MENS 50+ ADVANTAGE) TABS Take 1 tablet by mouth daily with breakfast.     amoxicillin-clavulanate (AUGMENTIN) 875-125 MG tablet Take 1 tablet by mouth 2 (two) times daily. 60 tablet 0   atorvastatin (LIPITOR) 10 MG tablet Take 1 tablet (10 mg total) by mouth daily. In the evening (Patient not taking: No sig reported) 90 tablet 3   Blood Glucose Monitoring Suppl (ACCU-CHEK GUIDE ME) w/Device KIT 1 kit by Does not apply route 3 (three) times daily. To check blood sugars (Patient not taking: Reported on 01/30/2021) 1 kit 0   cadexomer iodine (IODOSORB) 0.9 % gel Apply 1 application topically 3 (three) times a week. Cleanse wound with wound wash solution.  Apply small dab of iodorsorb and cover with a dry sterile dressing daily (Patient not taking: No sig reported) 40 g 3   diclofenac sodium (VOLTAREN) 1 % GEL Apply 2 g topically 3 (three) times daily. (Patient not taking: No sig reported) 2 g 1   doxycycline (VIBRA-TABS) 100 MG tablet Take 1 tablet (100 mg total) by mouth 2 (two) times daily. 60 tablet 0   hydrochlorothiazide (MICROZIDE) 12.5 MG capsule Take 1 capsule (12.5 mg total) by mouth daily. (Patient not taking: No sig reported) 90 capsule 1   HYDROcodone-acetaminophen (NORCO/VICODIN) 5-325 MG tablet Take 1 tablet by mouth every 4 (four) hours as needed. 30 tablet 0   mupirocin ointment (BACTROBAN) 2 % Apply 1 application topically 2 (two) times  daily. (Patient not taking: No sig reported) 22 g 0   polyethylene glycol (MIRALAX / GLYCOLAX) 17 g packet Take 17 g by mouth daily as needed for mild constipation. (Patient not taking: No sig reported) 14 each 0    Drug Regimen Review Drug regimen was reviewed and remains appropriate with no significant issues identified  Home: Home Living Family/patient expects to be discharged to:: Private residence Living Arrangements: Alone Available Help at Discharge: Family, Friend(s), Available PRN/intermittently, Other (Comment) (but has someone moving in who can live htere 24/7) Type of Home: House Home Access: Ramped entrance Home Layout: Two level Alternate Level Stairs-Number of Steps: has a ramp inside the home Bathroom Shower/Tub: Tub/shower unit [employer]: Standard Home Equipment: Bedside commode, Grab bars - toilet, Grab bars - tub/shower, Environmental consultant - 2 wheels, Cane - single point   Functional History: Prior Function Level of Independence: Independent with assistive device(s)  Functional Status:  Mobility:  Bed Mobility Overal bed mobility: Needs Assistance Bed Mobility: Supine to Sit, Sit to Supine Supine to sit: Supervision, HOB elevated Sit to supine: Supervision, HOB elevated General bed mobility comments: HOB elevated, + rail Transfers Overall transfer level: Needs assistance Equipment used: Rolling walker (2 wheeled) Transfers: Sit to/from Stand Sit to Stand: Mod assist, From elevated surface General transfer comment: needed heavy modA even from significantly elevated surface, increased time and effort to steady Ambulation/Gait Ambulation/Gait assistance: Min assist Gait Distance (Feet): 10 Feet Assistive device: Rolling walker (2 wheeled) Gait Pattern/deviations:  (hop to pattern) General Gait Details: hop to pattern with bilateral lateral unsteadiness requiring MinA to stabilize Gait velocity: decreased    ADL:    Cognition: Cognition Overall Cognitive  Status: Within Functional Limits for tasks assessed Orientation Level: Oriented X4 Cognition Arousal/Alertness: Awake/alert Behavior During Therapy: WFL for tasks assessed/performed Overall Cognitive Status: Within Functional Limits for tasks assessed  Physical Exam: Blood pressure 110/62, pulse 64, temperature 99.1 F (37.3 C), temperature source Oral, resp. rate 15, height 6' 3"  (1.905 m), weight 100 kg, SpO2 97 %. Physical Exam Vitals and nursing note reviewed.  Constitutional:      Comments: Older gentleman sitting up in bed; appropriate, talkative, receiving IV Vanc via IV on LUE, NAD  HENT:     Head: Normocephalic and atraumatic.     Right Ear: External ear normal.     Left Ear: External ear normal.     Nose: Nose normal. No congestion.     Mouth/Throat:     Mouth: Mucous membranes are moist.     Pharynx: No oropharyngeal exudate.  Eyes:     General:        Right eye: No discharge.        Left eye: No discharge.     Extraocular Movements: Extraocular movements intact.  Cardiovascular:     Comments: Mildly tachycardic; irregular; no JVD Pulmonary:     Comments: CTA B/L- no W/R/R- good air movement Abdominal:     Comments: Soft, NT; however somewhat distended; hypoactive BS  Musculoskeletal:     Cervical back: Normal range of motion. No rigidity.     Comments: UE very strong 5/5 in B/L arms RLE- 5/5 in HF/KE/KF- not wearing prosthesis LLE- at least 3/5 in HF- has limb guard and wound VAC in place- no swelling in proximal limb  Skin:    Comments: His right BKA is well-healed.  Left BKA with wound VAC in place appropriately tender. No other skin breakdown assessed L AC fossa IV- look OK  Neurological:     Comments: Patient is alert.  Sitting up in bed.  Oriented x3 and follows commands.   Psychiatric:     Comments: Appropriate, talkative    Results for orders placed or performed during the hospital encounter of 01/30/21 (from the past 48 hour(s))  Glucose,  capillary     Status: Abnormal   Collection Time: 02/02/21 11:19 AM  Result Value Ref Range   Glucose-Capillary 157 (H) 70 - 99 mg/dL    Comment: Glucose reference range applies only to samples taken after fasting for at least 8 hours.  Glucose, capillary     Status: Abnormal   Collection Time: 02/02/21  4:31 PM  Result Value Ref Range   Glucose-Capillary 157 (H) 70 - 99 mg/dL    Comment: Glucose reference range applies only to samples taken after fasting for at least 8 hours.  Glucose, capillary     Status: Abnormal   Collection  Time: 02/02/21  8:36 PM  Result Value Ref Range   Glucose-Capillary 147 (H) 70 - 99 mg/dL    Comment: Glucose reference range applies only to samples taken after fasting for at least 8 hours.  Type and screen Order type and screen if day of surgery is less than 15 days from draw of preadmission visit or order morning of surgery if day of surgery is greater than 6 days from preadmission visit.     Status: None   Collection Time: 02/02/21  9:10 PM  Result Value Ref Range   ABO/RH(D) A POS    Antibody Screen NEG    Sample Expiration      02/05/2021,2359 Performed at [facility], [place] [address] [place], [place] [address]   Basic metabolic panel     Status: Abnormal   Collection Time: 02/03/21 12:57 AM  Result Value Ref Range   Sodium 135 135 - 145 mmol/L   Potassium 4.5 3.5 - 5.1 mmol/L   Chloride 104 98 - 111 mmol/L   CO2 22 22 - 32 mmol/L   Glucose, Bld 122 (H) 70 - 99 mg/dL    Comment: Glucose reference range applies only to samples taken after fasting for at least 8 hours.   BUN 15 8 - 23 mg/dL   Creatinine, Ser 1.25 (H) 0.61 - 1.24 mg/dL   Calcium 9.2 8.9 - 10.3 mg/dL   GFR, Estimated >60 >60 mL/min    Comment: (NOTE) Calculated using the CKD-EPI Creatinine Equation (2021)    Anion gap 9 5 - 15    Comment: Performed at [facility] [address] [place], [place] [address]  CBC     Status: Abnormal   Collection Time: 02/03/21 12:57  AM  Result Value Ref Range   WBC 15.9 (H) 4.0 - 10.5 K/uL   RBC 3.16 (L) 4.22 - 5.81 MIL/uL   Hemoglobin 9.4 (L) 13.0 - 17.0 g/dL   HCT 29.6 (L) 39.0 - 52.0 %   MCV 93.7 80.0 - 100.0 fL   MCH 29.7 26.0 - 34.0 pg   MCHC 31.8 30.0 - 36.0 g/dL   RDW 12.6 11.5 - 15.5 %   Platelets 396 150 - 400 K/uL   nRBC 0.0 0.0 - 0.2 %    Comment: Performed at [facility], [place] [address] [place], [place] [address]  Magnesium     Status: None   Collection Time: 02/03/21 12:57 AM  Result Value Ref Range   Magnesium 1.7 1.7 - 2.4 mg/dL    Comment: Performed at [facility] [address] [place], [place] [address]  Phosphorus     Status: None   Collection Time: 02/03/21 12:57 AM  Result Value Ref Range   Phosphorus 3.6 2.5 - 4.6 mg/dL    Comment: Performed at [facility] [address], [place], [place] [address]  Glucose, capillary     Status: Abnormal   Collection Time: 02/03/21  4:06 AM  Result Value Ref Range   Glucose-Capillary 121 (H) 70 - 99 mg/dL    Comment: Glucose reference range applies only to samples taken after fasting for at least 8 hours.  Glucose, capillary     Status: Abnormal   Collection Time: 02/03/21  7:45 AM  Result Value Ref Range   Glucose-Capillary 158 (H) 70 - 99 mg/dL    Comment: Glucose reference range applies only to samples taken after fasting for at least 8 hours.  Glucose,  capillary     Status: Abnormal   Collection Time: 02/03/21 10:22 AM  Result Value Ref Range   Glucose-Capillary 155 (H) 70 - 99 mg/dL    Comment: Glucose reference range applies only to samples taken after fasting for at least 8 hours.  Glucose, capillary     Status: Abnormal   Collection Time: 02/03/21 10:45 AM  Result Value Ref Range   Glucose-Capillary 142 (H) 70 - 99 mg/dL    Comment: Glucose reference range applies only to samples taken after fasting for at least 8 hours.  Glucose, capillary     Status: Abnormal   Collection Time: 02/03/21 12:42 PM  Result Value  Ref Range   Glucose-Capillary 149 (H) 70 - 99 mg/dL    Comment: Glucose reference range applies only to samples taken after fasting for at least 8 hours.   Comment 1 Notify RN    Comment 2 Document in Chart   Glucose, capillary     Status: Abnormal   Collection Time: 02/03/21  1:31 PM  Result Value Ref Range   Glucose-Capillary 149 (H) 70 - 99 mg/dL    Comment: Glucose reference range applies only to samples taken after fasting for at least 8 hours.  Glucose, capillary     Status: Abnormal   Collection Time: 02/03/21  5:30 PM  Result Value Ref Range   Glucose-Capillary 116 (H) 70 - 99 mg/dL    Comment: Glucose reference range applies only to samples taken after fasting for at least 8 hours.  Glucose, capillary     Status: Abnormal   Collection Time: 02/03/21  9:11 PM  Result Value Ref Range   Glucose-Capillary 165 (H) 70 - 99 mg/dL    Comment: Glucose reference range applies only to samples taken after fasting for at least 8 hours.  CBC with Differential/Platelet     Status: Abnormal   Collection Time: 02/04/21  1:42 AM  Result Value Ref Range   WBC 16.4 (H) 4.0 - 10.5 K/uL   RBC 3.09 (L) 4.22 - 5.81 MIL/uL   Hemoglobin 9.3 (L) 13.0 - 17.0 g/dL   HCT 28.6 (L) 39.0 - 52.0 %   MCV 92.6 80.0 - 100.0 fL   MCH 30.1 26.0 - 34.0 pg   MCHC 32.5 30.0 - 36.0 g/dL   RDW 12.7 11.5 - 15.5 %   Platelets 398 150 - 400 K/uL   nRBC 0.0 0.0 - 0.2 %   Neutrophils Relative % 62 %   Neutro Abs 10.2 (H) 1.7 - 7.7 K/uL   Lymphocytes Relative 34 %   Lymphs Abs 5.6 (H) 0.7 - 4.0 K/uL   Monocytes Relative 1 %   Monocytes Absolute 0.2 0.1 - 1.0 K/uL   Eosinophils Relative 3 %   Eosinophils Absolute 0.5 0.0 - 0.5 K/uL   Basophils Relative 0 %   Basophils Absolute 0.0 0.0 - 0.1 K/uL   WBC Morphology MORPHOLOGY UNREMARKABLE    RBC Morphology MORPHOLOGY UNREMARKABLE    Smear Review Normal platelet morphology    Abs Immature Granulocytes 0.00 0.00 - 0.07 K/uL    Comment: Performed at [facility], [address] [place], [place] [address]  Comprehensive metabolic panel     Status: Abnormal   Collection Time: 02/04/21  1:42 AM  Result Value Ref Range   Sodium 133 (L) 135 - 145 mmol/L   Potassium 4.9 3.5 - 5.1 mmol/L   Chloride 102 98 - 111 mmol/L   CO2 24 22 -  32 mmol/L   Glucose, Bld 160 (H) 70 - 99 mg/dL    Comment: Glucose reference range applies only to samples taken after fasting for at least 8 hours.   BUN 21 8 - 23 mg/dL   Creatinine, Ser 1.30 (H) 0.61 - 1.24 mg/dL   Calcium 9.1 8.9 - 10.3 mg/dL   Total Protein 6.9 6.5 - 8.1 g/dL   Albumin 2.5 (L) 3.5 - 5.0 g/dL   AST 83 (H) 15 - 41 U/L   ALT 59 (H) 0 - 44 U/L   Alkaline Phosphatase 63 38 - 126 U/L   Total Bilirubin 0.2 (L) 0.3 - 1.2 mg/dL   GFR, Estimated 58 (L) >60 mL/min    Comment: (NOTE) Calculated using the CKD-EPI Creatinine Equation (2021)    Anion gap 7 5 - 15    Comment: Performed at [facility], [place] [address], [place], [place] [address]  Magnesium     Status: None   Collection Time: 02/04/21  1:42 AM  Result Value Ref Range   Magnesium 1.8 1.7 - 2.4 mg/dL    Comment: Performed at [facility] [address] [place], [place] [address]  Phosphorus     Status: None   Collection Time: 02/04/21  1:42 AM  Result Value Ref Range   Phosphorus 3.4 2.5 - 4.6 mg/dL    Comment: Performed at [facility] [address], [place], [place] [address]  Iron and TIBC     Status: Abnormal   Collection Time: 02/04/21  1:42 AM  Result Value Ref Range   Iron 15 (L) 45 - 182 ug/dL   TIBC 202 (L) 250 - 450 ug/dL   Saturation Ratios 7 (L) 17.9 - 39.5 %   UIBC 187 ug/dL    Comment: Performed at [facility], [place] [address] [place], [place] [address]  Folate     Status: None   Collection Time: 02/04/21  1:42 AM  Result Value Ref Range   Folate 22.2 >5.9 ng/mL    Comment: Performed at [facility], [place] [address] [place], [place] [address]  Ferritin     Status: Abnormal    Collection Time: 02/04/21  1:42 AM  Result Value Ref Range   Ferritin 465 (H) 24 - 336 ng/mL    Comment: Performed at [facility], [place] [address], [place],  [address]  Vitamin B12     Status: None   Collection Time: 02/04/21  1:42 AM  Result Value Ref Range   Vitamin B-12 568 180 - 914 pg/mL    Comment: (NOTE) This assay is not validated for testing neonatal or myeloproliferative syndrome specimens for Vitamin B12 levels. Performed at [facility], [place] [address] [place], [place] [address]   Glucose, capillary     Status: Abnormal   Collection Time: 02/04/21  6:26 AM  Result Value Ref Range   Glucose-Capillary 146 (H) 70 - 99 mg/dL    Comment: Glucose reference range applies only to samples taken after fasting for at least 8 hours.   No results found.     Medical Problem List and Plan: 1.  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  Continue wound VAC as directed  -patient may not shower until VAC removed- will be removed 9/14  -ELOS/Goals: 9-12 days- mod I to supervision 2.  Antithrombotics: -DVT/anticoagulation:  Pharmaceutical: Heparin  -antiplatelet therapy: Aspirin  81  mg daily 3. Pain Management: Oxycodone as needed 4. Mood: Provide emotional support  -antipsychotic agents: N/A 5. Neuropsych: This patient is capable of making decisions on his own behalf. 6. Skin/Wound Care: Routine skin checks 7. Fluids/Electrolytes/Nutrition: Routine in and outs with follow-up chemistries 8.  Acute blood loss anemia.  Follow-up CBC.  Continue iron supplement 9.  Diabetes mellitus with peripheral neuropathy.  Hemoglobin A1c 9.4.  SSI. Basaglar 15 unitdaily 10.  Hypertension.  Norvasc 10 mg daily.  Monitor with increased mobility 11.  Hyperlipidemia.  Lipitor 12.  CKD stage II.  Creatinine baseline 1.26-1.52.  Follow-up chemistries 13.  Diastolic congestive heart failure.  Monitor for any signs of fluid  overload 14. Constipation- LBM 5 days ago- if doesn't go with regular bowel meds by tomorrow, will give Sorbitol.     Lavon Paganini Angiulli, PA-C 02/04/2021   I have personally performed a face to face diagnostic evaluation of this patient and formulated the key components of the plan.  Additionally, I have personally reviewed laboratory data, imaging studies, as well as relevant notes and concur with the physician assistant's documentation above.   The patient's status has not changed from the original H&P.  Any changes in documentation from the acute care chart have been noted above.

## 2021-02-04 NOTE — Progress Notes (Signed)
Pt arrived to unit via bed, pt is alert, oriented to rehab

## 2021-02-04 NOTE — Progress Notes (Signed)
Postop day 1 status post left below-knee amputation.  He is sitting up and alert and very comfortable.  Vital signs stable he does have white count of 15.  Difficult to say whether this is stress from surgery.  Patient wound VAC is functioning though has 2 black marks but is drawing suction with 0 cc in the canister   Met with rehab PA while visiting with patient.  Patient has gone to inpatient rehab before and has done incredibly well.  From an orthopedic standpoint could transfer to inpatient rehab today if appropriate and no other medical barriers.

## 2021-02-04 NOTE — Consult Note (Signed)
   [facility] Inpatient Consult   02/04/2021  Joseph [name] 1945/10/21 [id number]  [facility] [[facility]]  Accountable Care Organization [ACO] Patient: Medicare CMS DCE  Primary Care Provider:  Hoy Register, MD, [facility]ness, does the [facility] follow up calls and appointments.  Patient is currently active with [facility] [[facility]] Care Management for chronic disease management services.  Patient has been engaged by a [facility] RN Care Management Coordinator.  Our community based plan of care has focused on disease management and community resource support.  Patient is post surgical and currently being evaluated for an inpatient rehabilitation program for transition of care.  Plan: Continue to follow for progress and disposition [SNF verses CIR].  Will update [facility] RN Care Coordinator.  Of note, [facility] Care Management services does not replace or interfere with any services that are needed or arranged by inpatient [facility] care management team.  For additional questions or referrals please contact:   Charlesetta Shanks, RN BSN CCM [facility]  [phone] business mobile phone Toll free office [phone]  Fax number: [phone] [email or web address]

## 2021-02-04 NOTE — H&P (Signed)
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  Per chart review patient lives alone.  Two-level home ramped entrance.  Independent with assistive device using prosthesis.  He reports he has someone moving in with him to help assist.  Presented 01/30/2021 with osteomyelitis of the left great toe followed by Dr. Sharol Given with essentially no circulation from the ankle distally.  His dorsalis pedis ABI is 0.2 posterior tibial ABI 0.  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 Sharol Given.  Wound VAC applied as directed.  Hospital course acute blood loss anemia 9.3, leukocytosis 16,400 felt to be reactive after surgery.  Noted history of CKD stage II creatinine 1.30.  Subcutaneous heparin for DVT prophylaxis.  Therapy evaluations completed due to patient decreased functional mobility was admitted for a comprehensive rehab program.   \Pt reports pain is ~ 28/10- was 10/10 earlier, but meds kicked in.  LBM 5 days ago- doesn't want BM until can use BSC.    Voiding well.  Passing gas.  Loves poetry.    Review of Systems  Constitutional:  Negative for chills and fever.  Eyes:  Negative for blurred vision and double vision.  Respiratory:  Negative for cough and shortness of breath.   Cardiovascular:  Positive for leg swelling. Negative for chest pain and palpitations.  Gastrointestinal:  Positive for constipation. Negative for heartburn, nausea and vomiting.  Genitourinary:  Positive for urgency. Negative for dysuria, flank pain and hematuria.  Musculoskeletal:  Positive for joint pain and myalgias.  Skin:  Negative for rash.  All other systems reviewed and are negative.     Past  Medical History:  Diagnosis Date   Cellulitis of right lower extremity     Diabetes mellitus type 2 in nonobese [facility])     Gangrene of right foot ([place])     Hypertension     Open wound of right foot           Past Surgical History:  Procedure Laterality Date   ABDOMINAL AORTOGRAM W/LOWER EXTREMITY Left 01/27/2021    Procedure: ABDOMINAL AORTOGRAM W/LOWER EXTREMITY;  Surgeon: Cherre Robins, MD;  Location: [facility];  Service: Cardiovascular;  Laterality: Left;   AMPUTATION Right 11/16/2018    Procedure: RIGHT BELOW KNEE AMPUTATION;  Surgeon: Newt Minion, MD;  Location: [facility];  Service: Orthopedics;  Laterality: Right;   AMPUTATION Left 02/03/2021    Procedure: LEFT BELOW KNEE AMPUTATION;  Surgeon: Newt Minion, MD;  Location: [facility];  Service: Orthopedics;  Laterality: Left;   APPLICATION OF WOUND VAC   02/03/2021    Procedure: APPLICATION OF WOUND VAC;  Surgeon: Newt Minion, MD;  Location[facility];  Service: Orthopedics;;   HAND SURGERY             Family History  Problem Relation Age of Onset   Diabetes Father     Cancer Neg Hx     CAD Neg Hx      Social History:  reports that he has never smoked. He has never used smokeless tobacco. He reports current alcohol use. He reports that he does not use drugs. Allergies: No Known Allergies  Medications Prior to Admission  Medication Sig Dispense Refill   Accu-Chek FastClix Lancets MISC Use to check blood sugars three times per day (Patient taking differently: 1 each by Other route See admin instructions. Use to check blood sugars three times per day) 100 each 11   amLODipine (NORVASC) 10 MG tablet Take 1 tablet (10 mg total) by mouth daily. 90 tablet 3   aspirin EC 81 MG tablet Take 81 mg by mouth daily. Swallow whole.       glipiZIDE (GLUCOTROL) 10 MG tablet TAKE 1 TABLET (10 MG TOTAL) BY MOUTH 2 (TWO) TIMES DAILY BEFORE A MEAL. (Patient taking differently: Take 10 mg by mouth 2 (two) times daily before a meal.) 60 tablet  2   glucose blood (ACCU-CHEK GUIDE) test strip Use as instructed (Patient taking differently: 1 each by Other route as directed. Use as instructed) 100 each 12   Insulin Glargine (BASAGLAR KWIKPEN) 100 UNIT/ML Inject 15 Units into the skin daily. 15 mL 3   Insulin Pen Needle 31G X 5 MM MISC use one pen needle once daily. 100 each 1   Lancets Misc. (ACCU-CHEK FASTCLIX LANCET) KIT Use when checking blood sugars three times daily (Patient taking differently: 1 each by Other route See admin instructions. Use when checking blood sugars three times daily) 1 kit 11   Multiple Vitamins-Minerals (ONE-A-DAY MENS 50+ ADVANTAGE) TABS Take 1 tablet by mouth daily with breakfast.       amoxicillin-clavulanate (AUGMENTIN) 875-125 MG tablet Take 1 tablet by mouth 2 (two) times daily. 60 tablet 0   atorvastatin (LIPITOR) 10 MG tablet Take 1 tablet (10 mg total) by mouth daily. In the evening (Patient not taking: No sig reported) 90 tablet 3   Blood Glucose Monitoring Suppl (ACCU-CHEK GUIDE ME) w/Device KIT 1 kit by Does not apply route 3 (three) times daily. To check blood sugars (Patient not taking: Reported on 01/30/2021) 1 kit 0   cadexomer iodine (IODOSORB) 0.9 % gel Apply 1 application topically 3 (three) times a week. Cleanse wound with wound wash solution.  Apply small dab of iodorsorb and cover with a dry sterile dressing daily (Patient not taking: No sig reported) 40 g 3   diclofenac sodium (VOLTAREN) 1 % GEL Apply 2 g topically 3 (three) times daily. (Patient not taking: No sig reported) 2 g 1   doxycycline (VIBRA-TABS) 100 MG tablet Take 1 tablet (100 mg total) by mouth 2 (two) times daily. 60 tablet 0   hydrochlorothiazide (MICROZIDE) 12.5 MG capsule Take 1 capsule (12.5 mg total) by mouth daily. (Patient not taking: No sig reported) 90 capsule 1   HYDROcodone-acetaminophen (NORCO/VICODIN) 5-325 MG tablet Take 1 tablet by mouth every 4 (four) hours as needed. 30 tablet 0   mupirocin ointment (BACTROBAN) 2 %  Apply 1 application topically 2 (two) times daily. (Patient not taking: No sig reported) 22 g 0   polyethylene glycol (MIRALAX / GLYCOLAX) 17 g packet Take 17 g by mouth daily as needed for mild constipation. (Patient not taking: No sig reported) 14 each 0      Drug Regimen Review Drug regimen was reviewed and remains appropriate with no significant issues identified   Home: Home Living Family/patient expects to be discharged to:: Private residence Living Arrangements: Alone Available Help at Discharge: Family, Friend(s), Available PRN/intermittently, Other (Comment) (but has someone moving in who can live htere 24/7) Type of Home: House Home Access: Ramped entrance Home Layout: Two level Alternate Level Stairs-Number of Steps: has a  ramp inside the home Bathroom Shower/Tub: Tub/shower unit [employer]: Standard Home Equipment: Bedside commode, Grab bars - toilet, Grab bars - tub/shower, Environmental consultant - 2 wheels, Cane - single point   Functional History: Prior Function Level of Independence: Independent with assistive device(s)   Functional Status:  Mobility: Bed Mobility Overal bed mobility: Needs Assistance Bed Mobility: Supine to Sit, Sit to Supine Supine to sit: Supervision, HOB elevated Sit to supine: Supervision, HOB elevated General bed mobility comments: HOB elevated, + rail Transfers Overall transfer level: Needs assistance Equipment used: Rolling walker (2 wheeled) Transfers: Sit to/from Stand Sit to Stand: Mod assist, From elevated surface General transfer comment: needed heavy modA even from significantly elevated surface, increased time and effort to steady Ambulation/Gait Ambulation/Gait assistance: Min assist Gait Distance (Feet): 10 Feet Assistive device: Rolling walker (2 wheeled) Gait Pattern/deviations:  (hop to pattern) General Gait Details: hop to pattern with bilateral lateral unsteadiness requiring MinA to stabilize Gait velocity: decreased   ADL:    Cognition: Cognition Overall Cognitive Status: Within Functional Limits for tasks assessed Orientation Level: Oriented X4 Cognition Arousal/Alertness: Awake/alert Behavior During Therapy: WFL for tasks assessed/performed Overall Cognitive Status: Within Functional Limits for tasks assessed   Physical Exam: Blood pressure 110/62, pulse 64, temperature 99.1 F (37.3 C), temperature source Oral, resp. rate 15, height 6' 3"  (1.905 m), weight 100 kg, SpO2 97 %. Physical Exam Vitals and nursing note reviewed.  Constitutional:      Comments: Older gentleman sitting up in bed; appropriate, talkative, receiving IV Vanc via IV on LUE, NAD  HENT:     Head: Normocephalic and atraumatic.     Right Ear: External ear normal.     Left Ear: External ear normal.     Nose: Nose normal. No congestion.     Mouth/Throat:     Mouth: Mucous membranes are moist.     Pharynx: No oropharyngeal exudate.  Eyes:     General:        Right eye: No discharge.        Left eye: No discharge.     Extraocular Movements: Extraocular movements intact.  Cardiovascular:     Comments: Mildly tachycardic; irregular; no JVD Pulmonary:     Comments: CTA B/L- no W/R/R- good air movement Abdominal:     Comments: Soft, NT; however somewhat distended; hypoactive BS  Musculoskeletal:     Cervical back: Normal range of motion. No rigidity.     Comments: UE very strong 5/5 in B/L arms RLE- 5/5 in HF/KE/KF- not wearing prosthesis LLE- at least 3/5 in HF- has limb guard and wound VAC in place- no swelling in proximal limb  Skin:    Comments: His right BKA is well-healed.  Left BKA with wound VAC in place appropriately tender. No other skin breakdown assessed L AC fossa IV- look OK  Neurological:     Comments: Patient is alert.  Sitting up in bed.  Oriented x3 and follows commands.   Psychiatric:     Comments: Appropriate, talkative      Lab Results Last 48 Hours        Results for orders placed or performed during  the hospital encounter of 01/30/21 (from the past 48 hour(s))  Glucose, capillary     Status: Abnormal    Collection Time: 02/02/21 11:19 AM  Result Value Ref Range    Glucose-Capillary 157 (H) 70 - 99 mg/dL      Comment: Glucose reference range applies only to samples taken after fasting  for at least 8 hours.  Glucose, capillary     Status: Abnormal    Collection Time: 02/02/21  4:31 PM  Result Value Ref Range    Glucose-Capillary 157 (H) 70 - 99 mg/dL      Comment: Glucose reference range applies only to samples taken after fasting for at least 8 hours.  Glucose, capillary     Status: Abnormal    Collection Time: 02/02/21  8:36 PM  Result Value Ref Range    Glucose-Capillary 147 (H) 70 - 99 mg/dL      Comment: Glucose reference range applies only to samples taken after fasting for at least 8 hours.  Type and screen Order type and screen if day of surgery is less than 15 days from draw of preadmission visit or order morning of surgery if day of surgery is greater than 6 days from preadmission visit.     Status: None    Collection Time: 02/02/21  9:10 PM  Result Value Ref Range    ABO/RH(D) A POS      Antibody Screen NEG      Sample Expiration          02/05/2021,2359 Performed at [facility], [place] [address], [place], [place] [address]    Basic metabolic panel     Status: Abnormal    Collection Time: 02/03/21 12:57 AM  Result Value Ref Range    Sodium 135 135 - 145 mmol/L    Potassium 4.5 3.5 - 5.1 mmol/L    Chloride 104 98 - 111 mmol/L    CO2 22 22 - 32 mmol/L    Glucose, Bld 122 (H) 70 - 99 mg/dL      Comment: Glucose reference range applies only to samples taken after fasting for at least 8 hours.    BUN 15 8 - 23 mg/dL    Creatinine, Ser 1.25 (H) 0.61 - 1.24 mg/dL    Calcium 9.2 8.9 - 10.3 mg/dL    GFR, Estimated >60 >60 mL/min      Comment: (NOTE) Calculated using the CKD-EPI Creatinine Equation (2021)      Anion gap 9 5 - 15      Comment: Performed at [facility] [address] [place], [place] [address]  CBC     Status: Abnormal    Collection Time: 02/03/21 12:57 AM  Result Value Ref Range    WBC 15.9 (H) 4.0 - 10.5 K/uL    RBC 3.16 (L) 4.22 - 5.81 MIL/uL    Hemoglobin 9.4 (L) 13.0 - 17.0 g/dL    HCT 29.6 (L) 39.0 - 52.0 %    MCV 93.7 80.0 - 100.0 fL    MCH 29.7 26.0 - 34.0 pg    MCHC 31.8 30.0 - 36.0 g/dL    RDW 12.6 11.5 - 15.5 %    Platelets 396 150 - 400 K/uL    nRBC 0.0 0.0 - 0.2 %      Comment: Performed at [facility], [place] [address], [place], [place] [address]  Magnesium     Status: None    Collection Time: 02/03/21 12:57 AM  Result Value Ref Range    Magnesium 1.7 1.7 - 2.4 mg/dL      Comment: Performed at [facility] [address] [place], [place] [address]  Phosphorus     Status: None    Collection Time: 02/03/21 12:57 AM  Result Value Ref Range    Phosphorus 3.6  2.5 - 4.6 mg/dL      Comment: Performed at [facility], [place] [address], [place], [place] [address]  Glucose, capillary     Status: Abnormal    Collection Time: 02/03/21  4:06 AM  Result Value Ref Range    Glucose-Capillary 121 (H) 70 - 99 mg/dL      Comment: Glucose reference range applies only to samples taken after fasting for at least 8 hours.  Glucose, capillary     Status: Abnormal    Collection Time: 02/03/21  7:45 AM  Result Value Ref Range    Glucose-Capillary 158 (H) 70 - 99 mg/dL      Comment: Glucose reference range applies only to samples taken after fasting for at least 8 hours.  Glucose, capillary     Status: Abnormal    Collection Time: 02/03/21 10:22 AM  Result Value Ref Range    Glucose-Capillary 155 (H) 70 - 99 mg/dL      Comment: Glucose reference range applies only to samples taken after fasting for at least 8 hours.  Glucose, capillary     Status: Abnormal    Collection Time: 02/03/21 10:45 AM  Result Value Ref Range    Glucose-Capillary 142 (H) 70 - 99 mg/dL      Comment: Glucose reference range applies  only to samples taken after fasting for at least 8 hours.  Glucose, capillary     Status: Abnormal    Collection Time: 02/03/21 12:42 PM  Result Value Ref Range    Glucose-Capillary 149 (H) 70 - 99 mg/dL      Comment: Glucose reference range applies only to samples taken after fasting for at least 8 hours.    Comment 1 Notify RN      Comment 2 Document in Chart    Glucose, capillary     Status: Abnormal    Collection Time: 02/03/21  1:31 PM  Result Value Ref Range    Glucose-Capillary 149 (H) 70 - 99 mg/dL      Comment: Glucose reference range applies only to samples taken after fasting for at least 8 hours.  Glucose, capillary     Status: Abnormal    Collection Time: 02/03/21  5:30 PM  Result Value Ref Range    Glucose-Capillary 116 (H) 70 - 99 mg/dL      Comment: Glucose reference range applies only to samples taken after fasting for at least 8 hours.  Glucose, capillary     Status: Abnormal    Collection Time: 02/03/21  9:11 PM  Result Value Ref Range    Glucose-Capillary 165 (H) 70 - 99 mg/dL      Comment: Glucose reference range applies only to samples taken after fasting for at least 8 hours.  CBC with Differential/Platelet     Status: Abnormal    Collection Time: 02/04/21  1:42 AM  Result Value Ref Range    WBC 16.4 (H) 4.0 - 10.5 K/uL    RBC 3.09 (L) 4.22 - 5.81 MIL/uL    Hemoglobin 9.3 (L) 13.0 - 17.0 g/dL    HCT 28.6 (L) 39.0 - 52.0 %    MCV 92.6 80.0 - 100.0 fL    MCH 30.1 26.0 - 34.0 pg    MCHC 32.5 30.0 - 36.0 g/dL    RDW 12.7 11.5 - 15.5 %    Platelets 398 150 - 400 K/uL    nRBC 0.0 0.0 - 0.2 %    Neutrophils Relative % 62 %  Neutro Abs 10.2 (H) 1.7 - 7.7 K/uL    Lymphocytes Relative 34 %    Lymphs Abs 5.6 (H) 0.7 - 4.0 K/uL    Monocytes Relative 1 %    Monocytes Absolute 0.2 0.1 - 1.0 K/uL    Eosinophils Relative 3 %    Eosinophils Absolute 0.5 0.0 - 0.5 K/uL    Basophils Relative 0 %    Basophils Absolute 0.0 0.0 - 0.1 K/uL    WBC Morphology MORPHOLOGY  UNREMARKABLE      RBC Morphology MORPHOLOGY UNREMARKABLE      Smear Review Normal platelet morphology      Abs Immature Granulocytes 0.00 0.00 - 0.07 K/uL      Comment: Performed at [facility] [address] [place], [place] [address]  Comprehensive metabolic panel     Status: Abnormal    Collection Time: 02/04/21  1:42 AM  Result Value Ref Range    Sodium 133 (L) 135 - 145 mmol/L    Potassium 4.9 3.5 - 5.1 mmol/L    Chloride 102 98 - 111 mmol/L    CO2 24 22 - 32 mmol/L    Glucose, Bld 160 (H) 70 - 99 mg/dL      Comment: Glucose reference range applies only to samples taken after fasting for at least 8 hours.    BUN 21 8 - 23 mg/dL    Creatinine, Ser 1.30 (H) 0.61 - 1.24 mg/dL    Calcium 9.1 8.9 - 10.3 mg/dL    Total Protein 6.9 6.5 - 8.1 g/dL    Albumin 2.5 (L) 3.5 - 5.0 g/dL    AST 83 (H) 15 - 41 U/L    ALT 59 (H) 0 - 44 U/L    Alkaline Phosphatase 63 38 - 126 U/L    Total Bilirubin 0.2 (L) 0.3 - 1.2 mg/dL    GFR, Estimated 58 (L) >60 mL/min      Comment: (NOTE) Calculated using the CKD-EPI Creatinine Equation (2021)      Anion gap 7 5 - 15      Comment: Performed at [facility], [place] [address] [place], [place] [address]  Magnesium     Status: None    Collection Time: 02/04/21  1:42 AM  Result Value Ref Range    Magnesium 1.8 1.7 - 2.4 mg/dL      Comment: Performed at [facility] [address] [place], [place] [address]  Phosphorus     Status: None    Collection Time: 02/04/21  1:42 AM  Result Value Ref Range    Phosphorus 3.4 2.5 - 4.6 mg/dL      Comment: Performed at [facility] [address], [place], [place] [address]  Iron and TIBC     Status: Abnormal    Collection Time: 02/04/21  1:42 AM  Result Value Ref Range    Iron 15 (L) 45 - 182 ug/dL    TIBC 202 (L) 250 - 450 ug/dL    Saturation Ratios 7 (L) 17.9 - 39.5 %    UIBC 187 ug/dL      Comment: Performed at [facility], [place] [address] [place], [place] [address]   Folate     Status: None    Collection Time: 02/04/21  1:42 AM  Result Value Ref Range    Folate 22.2 >5.9 ng/mL      Comment: Performed at [facility], [place] [address], [place], [place]  [address]  Ferritin     Status: Abnormal    Collection Time: 02/04/21  1:42 AM  Result Value Ref Range    Ferritin 465 (H) 24 - 336 ng/mL      Comment: Performed at [facility], [place] [address], [place], [place] [address]  Vitamin B12     Status: None    Collection Time: 02/04/21  1:42 AM  Result Value Ref Range    Vitamin B-12 568 180 - 914 pg/mL      Comment: (NOTE) This assay is not validated for testing neonatal or myeloproliferative syndrome specimens for Vitamin B12 levels. Performed at [facility], [place] [address] [place], [place] [address]    Glucose, capillary     Status: Abnormal    Collection Time: 02/04/21  6:26 AM  Result Value Ref Range    Glucose-Capillary 146 (H) 70 - 99 mg/dL      Comment: Glucose reference range applies only to samples taken after fasting for at least 8 hours.      Imaging Results (Last 48 hours)  No results found.           Medical Problem List and Plan: 1.  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  Continue wound VAC as directed             -patient may not shower until VAC removed- will be removed 9/14             -ELOS/Goals: 9-12 days- mod I to supervision 2.  Antithrombotics: -DVT/anticoagulation:  Pharmaceutical: Heparin             -antiplatelet therapy: Aspirin 81  mg daily 3. Pain Management: Oxycodone as needed 4. Mood: Provide emotional support             -antipsychotic agents: N/A 5. Neuropsych: This patient is capable of making decisions on his own behalf. 6. Skin/Wound Care: Routine skin checks 7. Fluids/Electrolytes/Nutrition: Routine in and outs with follow-up chemistries 8.  Acute blood loss anemia.  Follow-up CBC.  Continue iron supplement 9.  Diabetes  mellitus with peripheral neuropathy.  Hemoglobin A1c 9.4.  SSI. Basaglar 15 unitdaily 10.  Hypertension.  Norvasc 10 mg daily.  Monitor with increased mobility 11.  Hyperlipidemia.  Lipitor 12.  CKD stage II.  Creatinine baseline 1.26-1.52.  Follow-up chemistries 13.  Diastolic congestive heart failure.  Monitor for any signs of fluid overload 14. Constipation- LBM 5 days ago- if doesn't go with regular bowel meds by tomorrow, will give Sorbitol.        Lavon Paganini Angiulli, PA-C 02/04/2021    I have personally performed a face to face diagnostic evaluation of this patient and formulated the key components of the plan.  Additionally, I have personally reviewed laboratory data, imaging studies, as well as relevant notes and concur with the physician assistant's documentation above.   The patient's status has not changed from the original H&P.  Any changes in documentation from the acute care chart have been noted above.

## 2021-02-04 NOTE — Progress Notes (Signed)
Inpatient Rehab Admissions Coordinator:   Consult received. I met with pt at the bedside to review CIR recommendations.  He has been with following his R BKA summer of 2020.  Plan to discharge home with mod I goals in 9-12 days, with support from neighbors and friends.  His children (who live out of state) are able to come stay with him if needed.  Dr. Florene Glen in agreement for pt to admit to CIR today. Will let pt/family and TOC know.   Shann Medal, PT, DPT Admissions Coordinator [phone] 02/04/21  1:36 PM

## 2021-02-04 NOTE — Progress Notes (Signed)
Report called to [employer] in [employer]. NT took patient to 4n17. Patients confirmed has has all belongings including prosthesis and prevena wound vac.

## 2021-02-04 NOTE — Discharge Summary (Signed)
Physician Discharge Summary  Joseph [name] SJG:[id number] DOB: [birth year] DOA: 01/30/2021  PCP: Charlott Rakes, MD  Admit date: 01/30/2021 Discharge date: 02/04/2021  Time spent: 40 minutes  Recommendations for inpatient rehab/Outpatient Follow-up:  Follow outpatient CBC/CMP (trend leukocytosis) Follow with ortho for wound care/vac management - post op care for transtibial amputation He reports frequent lows outpatient for blood sugar - I think his regimen would benefit from adjustment in CIR - stop glipizide, continue basaglar for now (he's not currently receiving basal in house and his BG is reasonable) - suspect he'll need adjustment of his basal insulin prior to discharge Continue iron supplementation - follow iron def outpatient Continue aspirin/statin   Discharge Diagnoses:  Principal Problem:   Osteomyelitis of great toe of left foot ([place]) Active Problems:   DM (diabetes mellitus) ([place])   Hypertension associated with diabetes ([place])   PVD (peripheral vascular disease) ([place])   Paroxysmal atrial fibrillation ([place])   Chronic diastolic congestive heart failure ([facility])   CKD (chronic kidney disease), stage II   Leukocytosis   Hyperlipidemia   Anemia   Discharge Condition: stable  Diet recommendation: carb mod  Filed Weights   01/30/21 1635 02/03/21 1044  Weight: 100 kg 100 kg    History of present illness:  This 75 y.o. male with PMH significant of diabetes mellitus, hypertension, CKD stage II, PAD, paroxysmal atrial fibrillation, left foot wound /osteomyelitis, history of right BKA. Patient presented for the reevaluation of left toe osteomyelitis and need for amputation. He was recently admitted but declined transtibial amputation at that time. Orthopedics is consulted and patient is now s/p L transtibial amputation on 9/7.  Plan for discharge to CIR on 9/8.  See below for additional details  Hospital Course:  Osteomyelitis of left great toe: Plain films 9/3 with generalized  soft tissue swelling, soft tissue defect in plantar/medial distal L first toe.  Loss of cortex compatible with erosive change throughout the medial/plantar aspect of the distal phalanx left first toe, similar to slightly worsened.  New mild erosive changes in the medial distal aspect of the proximal phalanx inn the L first toe.  Progression of acute osteo. S/p transtibial amputation and application of prevena wound vac by ortho on 9/7 Will d/c abx after today's doses - discussed with orthopedics Blood cultures no growth so far. Plan for CIR for rehab today   Leukocytosis : likely secondary to above. 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 Not on anticoagulation secondary to history of GI bleed Discontinue telemetry.   Chronic diastolic heart failure. Last TTE: LVEF of 50-55%.  Appears euvolemic.   Type 2 diabetes: Hemoglobin A1c 9.4% . He's on glipizide and basaglar at home, but notes frequent lows - needs further adjustment at CIR (he hasn't been on basal here, BG's reasonable - stop glipizide, adjust insulin in CIR)   CKD stage II: Baseline creatinine 1.2-1.3. Does not meet criteria for CKD stage IIIb.  Renal functions at baseline.   Essential hypertension : Continue amlodipine.    Peripheral arterial disease: Patient has been evaluated by vascular surgery with no options for revascularization.  On aspirin and Lipitor as an outpatient. Contributing to above infection / poor wound healing. Continue aspirin per orthopedic surgery recommendations.   Hyperlipidemia: Continue Lipitor.  Procedures: 8/7   PROCEDURE: Transtibial amputation Application of Prevena wound VAC  Consultations:  orthopedics  Discharge Exam: Vitals:   02/04/21 0509 02/04/21 0828  BP: 110/62 122/76  Pulse: 64 73   Resp: 15 16  Temp: 99.1 F (37.3 C) 98.8 F (37.1 C)  SpO2: 97% 99%   C/o LLE pain  General: No acute distress. Cardiovascular: RRR Lungs: unlabored Abdomen: Soft, nontender, nondistended  Neurological: Alert and oriented 3. Moves all extremities 4. Cranial nerves II through XII grossly intact. Skin: Warm and dry. No rashes or lesions. Extremities: bilateral BKA, L wound vac   Discharge Instructions   Discharge Instructions     Call MD for:  difficulty breathing, headache or visual disturbances   Complete by: As directed    Call MD for:  extreme fatigue   Complete by: As directed    Call MD for:  hives   Complete by: As directed    Call MD for:  persistant dizziness or light-headedness   Complete by: As directed    Call MD for:  persistant nausea and vomiting   Complete by: As directed    Call MD for:  redness, tenderness, or signs of infection (pain, swelling, redness, odor or green/yellow discharge around incision site)   Complete by: As directed    Call MD for:  severe uncontrolled pain   Complete by: As directed    Call MD for:  temperature >100.4   Complete by: As directed    Diet - low sodium heart healthy   Complete by: As directed    Discharge instructions   Complete by: As directed    You've been seen after Joseph [name] left transtibial amputation for osteomyelitis of your great toe and gangrene of your left foot.  You were not Joseph [name] candidate for revascularization after evaluation by vascular.  Continue aspirin and lipitor.    Orthopedics will follow your wound vac post op.  Please follow up with orthopedics as scheduled for your wound care and post op checks.  We're discharging you to inpatient rehab at this time.  You report low blood sugars with your glipizide and insulin.  I'll recommend inpatient rehab adjusts your insulin regimen, but I think your glipizide should be discontinued.  Return for new, recurrent, or worsening symptoms.  Please ask your PCP to request  records from this hospitalization so they know what was done and what the next steps will be.   Discharge wound care:   Complete by: As directed    Wound care and wound vac per orthopedics   Increase activity slowly   Complete by: As directed    Negative Pressure Wound Therapy - Incisional   Complete by: As directed    Show patient how to attach preveena pump. Please call office if alarms      Allergies as of 02/04/2021   No Known Allergies      Medication List     STOP taking these medications    amoxicillin-clavulanate 875-125 MG tablet Commonly known as: Augmentin   doxycycline 100 MG tablet Commonly known as: VIBRA-TABS   glipiZIDE 10 MG tablet Commonly known as: GLUCOTROL   hydrochlorothiazide 12.5 MG capsule Commonly known as: Microzide       TAKE these medications    Accu-Chek [employer] Kit Use when checking blood sugars three times daily What changed:  how much to take how to take this when to take this   Accu-Chek [employer] Lancets Misc Use to check blood sugars three times per day What changed:  how much to take how to take this when  to take this   Accu-Chek Guide Me w/Device Kit 1 kit by Does not apply route 3 (three) times daily. To check blood sugars   Accu-Chek Guide test strip Generic drug: glucose blood Use as instructed What changed:  how much to take how to take this when to take this   amLODipine 10 MG tablet Commonly known as: NORVASC Take 1 tablet (10 mg total) by mouth daily.   aspirin EC 81 MG tablet Take 81 mg by mouth daily. Swallow whole.   atorvastatin 10 MG tablet Commonly known as: LIPITOR Take 1 tablet (10 mg total) by mouth daily. In the evening   B-D UF III MINI PEN NEEDLES 31G X 5 MM Misc Generic drug: Insulin Pen Needle use one pen needle once daily.   Basaglar KwikPen 100 UNIT/ML Inject 15 Units into the skin daily.   cadexomer iodine 0.9 % gel Commonly known as: IODOSORB Apply 1 application  topically 3 (three) times Sujata Maines week. Cleanse wound with wound wash solution.  Apply small dab of iodorsorb and cover with Hyla Coard dry sterile dressing daily   diclofenac sodium 1 % Gel Commonly known as: VOLTAREN Apply 2 g topically 3 (three) times daily.   ferrous sulfate 325 (65 FE) MG tablet Take 1 tablet (325 mg total) by mouth daily with breakfast.   HYDROcodone-acetaminophen 5-325 MG tablet Commonly known as: NORCO/VICODIN Take 1 tablet by mouth every 4 (four) hours as needed.   mupirocin ointment 2 % Commonly known as: BACTROBAN Apply 1 application topically 2 (two) times daily.   One-Carollyn Etcheverry-Day Mens 50+ Advantage Tabs Take 1 tablet by mouth daily with breakfast.   polyethylene glycol 17 g packet Commonly known as: MIRALAX / GLYCOLAX Take 17 g by mouth daily as needed for mild constipation.               Discharge Care Instructions  (From admission, onward)           Start     Ordered   02/04/21 0000  Discharge wound care:       Comments: Wound care and wound vac per orthopedics   02/04/21 1250           No Known Allergies  Follow-up Information     Suzan Slick, NP Follow up in 1 week(s).   Specialty: Orthopedic Surgery Contact information: [place] [address] [phone]                  The results of significant diagnostics from this hospitalization (including imaging, microbiology, ancillary and laboratory) are listed below for reference.    Significant Diagnostic Studies: DG Chest 2 View  Result Date: 01/26/2021 CLINICAL DATA:  right toe infection EXAM: CHEST - 2 VIEW COMPARISON:  None. FINDINGS: The cardiomediastinal silhouette is within normal limits. No pleural effusion. No pneumothorax. No mass or consolidation. No acute osseous abnormality. IMPRESSION: No active cardiopulmonary disease. Electronically Signed   By: Albin Felling M.D.   On: 01/26/2021 08:56   PERIPHERAL VASCULAR CATHETERIZATION  Result Date:  01/28/2021 DATE OF SERVICE: 01/27/2021  PATIENT:  Joseph [name]  75 y.o. male  PRE-OPERATIVE DIAGNOSIS:  Atherosclerosis of native arteries of left lower extremity causing ulceration  POST-OPERATIVE DIAGNOSIS:  Same  PROCEDURE:  1) US guided right common femoral artery access 2) Aortogram 3) Left lower extremity angiogram with second order cannulation (90m total contrast) 4) Conscious sedation (26 minutes)  SURGEON:  TYevonne Aline HStanford Breed MD  ASSISTANT: none  ANESTHESIA:  local and IV sedation  ESTIMATED BLOOD LOSS: min  LOCAL MEDICATIONS USED:  LIDOCAINE  COUNTS: confirmed correct.  PATIENT DISPOSITION:  PACU - hemodynamically stable.  Delay start of Pharmacological VTE agent (>24hrs) due to surgical blood loss or risk of bleeding: no  INDICATION FOR PROCEDURE: Joseph [name] is Melecio Cueto 75 y.o. male with ulceration of the left great toe. After careful discussion of risks, benefits, and alternatives the patient was offered angiogram. The patient understood and wished to proceed.  OPERATIVE FINDINGS: Terminal aorta and iliac arteries: Widely patent without flow limiting stenosis  Left lower extremity: Common femoral artery: Widely patent without flow limiting stenosis Profunda femoris artery: Widely patent without flow limiting stenosis Superficial femoral artery: Widely patent without flow limiting stenosis Popliteal artery: Widely patent without flow limiting stenosis Anterior tibial artery: occluded Tibioperoneal trunk: heavily diseased, but patent Peroneal artery: heavily diseased. Only named vessel. Tapers to occlusion in mid calf Posterior tibial artery: occluded Pedal circulation: reconstitution of anterior tibial artery at the foot  GLASS score. Grade 3: expect 53% 1 year re-intervention rate; expect 69% 5 year re-intervention rate; expect 49% 5 year mortality; expect 61% rate of restenosis from open operation at 5 years; expect 83% rate of restenosis from endovascular procedure. P 1.  WIfI score. Stage 4: expected 50% risk  of major amputation within the year; revascularization may not reduce risk of amputation.  DESCRIPTION OF PROCEDURE: After identification of the patient in the pre-operative holding area, the patient was transferred to the operating room. The patient was positioned supine on the operating room table. Anesthesia was induced. The groins was prepped and draped in standard fashion. Rennie Rouch surgical pause was performed confirming correct patient, procedure, and operative location.  The right groin was anesthetized with subcutaneous injection of 1% lidocaine. Using ultrasound guidance, the right common femoral artery was accessed with micropuncture technique. Fluoroscopy was used to confirm cannulation over the femoral head. The 32F sheath was upsized to 53F.  Olaoluwa Grieder Benson wire was advanced into the distal aorta. Over the wire an omni flush catheter was advanced to the level of L2. Aortogram was performed - see above for details.  The left common iliac artery was selected with Kayode Petion Benson guidewire. The wire was advanced into the common femoral artery. Over the wire the omni flush catheter was advanced into the external iliac artery. Selective angiography was performed - see above for details.  The sheath was left in place to be pulled in the recovery area.  Conscious sedation was administered with the use of IV fentanyl and midazolam under continuous physician and nurse monitoring.  Heart rate, blood pressure, and oxygen saturation were continuously monitored.  Total sedation time was 26 minutes  Upon completion of the case instrument and sharps counts were confirmed correct. The patient was transferred to the PACU in good condition. I was present for all portions of the procedure.  PLAN: No good options for limb salvage. Alanah Sakuma pedal bypass may be technically achievable. I think his ischemia / tissue loss may be too severe. High risk for amputation.  Yevonne Aline. Stanford Breed, MD [facility] Phone Number:  [phone] 01/27/2021 9:18 AM   MR TOES LEFT W WO CONTRAST  Result Date: 01/09/2021 CLINICAL DATA:  Foot swelling, diabetic, osteomyelitis suspected, xray done EXAM: MRI OF THE LEFT TOES WITHOUT AND WITH CONTRAST TECHNIQUE: Multiplanar, multisequence MR imaging of the left forefoot was performed both before and after administration of intravenous contrast. CONTRAST:  40m MULTIHANCE  GADOBENATE DIMEGLUMINE 529 MG/ML IV SOLN COMPARISON:  X-ray 12/08/2020 FINDINGS: Bones/Joint/Cartilage Soft tissue ulceration at the plantar aspect of the great toe distal phalanx with marked bone marrow edema throughout the distal phalanx and associated confluent low T1 marrow signal compatible with acute osteomyelitis. There is bone marrow edema within the distal 1.5 cm of the great toe proximal phalanx extending to the interphalangeal joint with subtle intermediate T1 marrow signal suggestive of reactive osteitis or early acute osteomyelitis. 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 There is also ankylosis of the medial and intermediate cuneiform bones. Patchy edema-like bone marrow signal within the midfoot, which is likely degenerative/reactive. Mild degenerative changes involving the midfoot and first MTP joint. No acute fracture or dislocation. Ligaments Collateral ligaments of the distal forefoot appear grossly intact. Muscles and Tendons Denervation changes of the intrinsic foot musculature. No tenosynovial fluid collections. Soft tissues Plantar soft tissue ulceration underlies the great toe distal phalanx. Diffuse soft tissue edema throughout the distal forefoot. No organized fluid collection. IMPRESSION: 1. Acute osteomyelitis of the left great toe distal phalanx. 2. Findings suggestive of reactive osteitis or early acute osteomyelitis involving the proximal phalanx of the great toe. 3. Soft tissue ulceration along the plantar aspect of the great toe. Diffuse soft tissue edema throughout the distal  forefoot, suggesting cellulitis. No organized fluid collection. 4. 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 Electronically Signed   By: Davina Poke D.O.   On: 01/09/2021 15:21   DG Foot 2 Views Left  Result Date: 01/26/2021 CLINICAL DATA:  Toe infection. EXAM: LEFT FOOT - 2 VIEW COMPARISON:  12/08/2020 FINDINGS: Soft tissue wound identified medial aspect of the great toe. Since 12/08/2020, the tuft of the distal phalanx of the great toe has eroded, suggesting osteomyelitis. There is cortical disruption at the medial base of the great toe distal phalanx. Gas is identified in the soft tissues between the first and second toes. Degenerative changes noted first MTP joint. 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 Electronically Signed   By: Misty Stanley M.D.   On: 01/26/2021 08:55   DG Toe Great Left  Result Date: 01/30/2021 CLINICAL DATA:  Recent osteomyelitis, worsening wound for 1 week in the plantar left first toe EXAM: LEFT GREAT TOE COMPARISON:  01/26/2021 left foot radiographs FINDINGS: Generalized soft tissue swelling. Soft tissue defect in plantar/medial distal left first toe. Loss of cortex compatible with erosive change throughout the medial/plantar aspect of the distal phalanx left first toe, similar to slightly worsened. New mild erosive changes in the medial distal aspect of the proximal phalanx in the left first toe. No focal osseous lesions. No fracture. No dislocation. No radiopaque foreign bodies. IMPRESSION: Generalized soft tissue swelling. Soft tissue defect in the plantar/medial distal left first toe. Loss of cortex compatible with erosive change throughout the medial/plantar aspect of the distal phalanx left first toe, similar to slightly worsened. New mild erosive changes in the medial distal aspect of the  proximal phalanx in the left first toe. These findings are compatible with progression of acute osteomyelitis. Electronically Signed   By: Ilona Sorrel M.D.   On: 01/30/2021 06:36   VAS [place] ABI WITH/WO TBI  Result Date: 01/26/2021  LOWER EXTREMITY DOPPLER STUDY Patient Name:  Joseph [name]  Date of Exam:   01/26/2021 Medical Rec #: [id number]    Accession #:  [id number] Date of Birth: November 08, 1945    Patient Gender: M Patient Age:   75 years Exam Location:  [facility] Procedure:      VAS [place] ABI WITH/WO TBI Referring Phys: Gertie Fey TRAN --------------------------------------------------------------------------------  Indications: Ulceration, and peripheral artery disease. High Risk Factors: Hypertension, Diabetes. Other Factors: Right BKA.  Limitations: Today's exam was limited due to an open wound. Comparison Study: 11/14/2018 ABI- right noncompressible, dampened monophasic                   waveforms, left falsely elevated with dampened monophasic                   waveforms. Performing Technologist: Maudry Mayhew MHA, RVT, RDCS, RDMS  Examination Guidelines: Orvin Netter complete evaluation includes at minimum, Doppler waveform signals and systolic blood pressure reading at the level of bilateral brachial, anterior tibial, and posterior tibial arteries, when vessel segments are accessible. Bilateral testing is considered an integral part of Yusuke Beza complete examination. Photoelectric Plethysmograph (PPG) waveforms and toe systolic pressure readings are included as required and additional duplex testing as needed. Limited examinations for reoccurring indications may be performed as noted.  ABI Findings: +--------+------------------+-----+---------+--------+ Right   Rt Pressure (mmHg)IndexWaveform Comment  +--------+------------------+-----+---------+--------+ [id number]                    triphasic         +--------+------------------+-----+---------+--------+ PTA                                     BKA       +--------+------------------+-----+---------+--------+ DP                                      BKA      +--------+------------------+-----+---------+--------+ +--------+------------------+-----+-------------------+-------+ Left    Lt Pressure (mmHg)IndexWaveform           Comment +--------+------------------+-----+-------------------+-------+ [id number]                    triphasic                  +--------+------------------+-----+-------------------+-------+ PTA                            absent                     +--------+------------------+-----+-------------------+-------+ DP      32                0.20 dampened monophasic        +--------+------------------+-----+-------------------+-------+ +-------+-----------+-----------+------------+------------+ ABI/TBIToday's ABIToday's TBIPrevious ABIPrevious TBI +-------+-----------+-----------+------------+------------+ Right  BKA        BKA                                 +-------+-----------+-----------+------------+------------+ Left   0.20       Wound                               +-------+-----------+-----------+------------+------------+  Summary:  *See table(s) above for measurements and observations.  Electronically signed by Deitra Mayo MD on 01/26/2021 at 5:44:59 PM.    Final     Microbiology: Recent  Results (from the past 240 hour(s))  SARS CORONAVIRUS 2 (TAT 6-24 HRS) Nasopharyngeal Nasopharyngeal Swab     Status: None   Collection Time: 01/26/21  3:57 PM   Specimen: Nasopharyngeal Swab  Result Value Ref Range Status   SARS Coronavirus 2 NEGATIVE NEGATIVE Final    Comment: (NOTE) SARS-CoV-2 target nucleic acids are NOT DETECTED.  The SARS-CoV-2 RNA is generally detectable in upper and lower respiratory specimens during the acute phase of infection. Negative results do not preclude SARS-CoV-2 infection, do not rule out co-infections with other pathogens, and should not be used as  the sole basis for treatment or other patient management decisions. Negative results must be combined with clinical observations, patient history, and epidemiological information. The expected result is Negative.  Fact Sheet for Patients: SugarRoll.be  Fact Sheet for Healthcare Providers: https://www.woods-mathews.com/  This test is not yet approved or cleared by the [place] FDA and  has been authorized for detection and/or diagnosis of SARS-CoV-2 by FDA under an Emergency Use Authorization (EUA). This EUA will remain  in effect (meaning this test can be used) for the duration of the COVID-19 declaration under Se ction 564(b)(1) of the Act, 21 U.S.C. section 360bbb-3(b)(1), unless the authorization is terminated or revoked sooner.  Performed at [facility], [place] [address], [place], [place] [address]   MRSA Next Gen by PCR, Nasal     Status: None   Collection Time: 01/27/21  8:10 AM   Specimen: Nasal Mucosa; Nasal Swab  Result Value Ref Range Status   MRSA by PCR Next Gen NOT DETECTED NOT DETECTED Final    Comment: (NOTE) The GeneXpert MRSA Assay (FDA approved for NASAL specimens only), is one component of Antwaine Boomhower comprehensive MRSA colonization surveillance program. It is not intended to diagnose MRSA infection nor to guide or monitor treatment for MRSA infections. Test performance is not FDA approved in patients less than 66 years old. Performed at [facility], [place] [address], [place], [place] [address]   Blood culture (routine x 2)     Status: None   Collection Time: 01/30/21  5:56 AM   Specimen: BLOOD  Result Value Ref Range Status   Specimen Description BLOOD SITE NOT SPECIFIED  Final   Special Requests   Final    BOTTLES DRAWN AEROBIC AND ANAEROBIC Blood Culture adequate volume   Culture   Final    NO GROWTH 5 DAYS Performed at [facility], [address] [place], [place] [address]    Report Status  02/04/2021 FINAL  Final  Blood culture (routine x 2)     Status: None   Collection Time: 01/30/21  5:56 AM   Specimen: BLOOD  Result Value Ref Range Status   Specimen Description BLOOD SITE NOT SPECIFIED  Final   Special Requests AEROBIC BOTTLE ONLY Blood Culture adequate volume  Final   Culture   Final    NO GROWTH 5 DAYS Performed at [facility], [place] [address] [place], [place] [address]    Report Status 02/04/2021 FINAL  Final     Labs: Basic Metabolic Panel: Recent Labs  Lab 01/30/21 0556 01/31/21 0115 02/01/21 1104 02/03/21 0057 02/04/21 0142  NA 133* 136 137 135 133*  K 3.9 4.3 4.0 4.5 4.9  CL 101 107 104 104 102  CO2 21* _0 GLUCOSE 145* 90 154* 122* 160*  BUN _1 CREATININE 1.52* 1.21 1.26* 1.25* 1.30*  CALCIUM 9.3 8.9 9.3 9.2 9.1  MG  --   --   --  1.7 1.8  PHOS  --   --   --  3.6 3.4   Liver Function Tests: Recent Labs  Lab 01/30/21 0556 02/04/21 0142  AST 31 83*  ALT 21 59*  ALKPHOS 62 63  BILITOT 1.1 0.2*  PROT 7.8 6.9  ALBUMIN 3.1* 2.5*   No results for input(s): LIPASE, AMYLASE in the last 168 hours. No results for input(s): AMMONIA in the last 168 hours. CBC: Recent Labs  Lab 01/30/21 0556 01/31/21 0115 02/02/21 0254 02/03/21 0057 02/04/21 0142  WBC 19.7* 14.6* 14.5* 15.9* 16.4*  NEUTROABS 12.0*  --   --   --  10.2*  HGB 10.0* 9.5* 9.4* 9.4* 9.3*  HCT 30.3* 29.7* 29.0* 29.6* 28.6*  MCV 91.8 92.8 92.4 93.7 92.6  PLT 352 350 382 396 398   Cardiac Enzymes: No results for input(s): CKTOTAL, CKMB, CKMBINDEX, TROPONINI in the last 168 hours. BNP: BNP (last 3 results) No results for input(s): BNP in the last 8760 hours.  ProBNP (last 3 results) No results for input(s): PROBNP in the last 8760 hours.  CBG: Recent Labs  Lab 02/03/21 1331 02/03/21 1730 02/03/21 2111 02/04/21 0626 02/04/21 1235  GLUCAP 149* 116* 165* 146* 181*       Signed:  Fayrene Helper MD.  [facility] 02/04/2021,  12:59 PM

## 2021-02-04 NOTE — PMR Pre-admission (Signed)
PMR Admission Coordinator Pre-Admission Assessment  Patient: Joseph [name] is an 75 y.o., male MRN: [id number] DOB: [birth year] Height: _0  (190.5 cm) Weight: 100 kg  Insurance Information HMO:     PPO:      PCP:      IPA:      80/20:      OTHER:  PRIMARY: Medicare A/B      Policy#: [id number]      Subscriber: pt CM Name:       Phone#:      Fax#:  Pre-Cert#: verified Civil engineer, contracting:  Benefits:  Phone #:      Name:  Eff. Date: A 10/28/12, B 04/29/18     Deduct: $1556      Out of Pocket Max: n/a      Life Max: n/a CIR: 100%      SNF: 20 full days Outpatient: 80%     Co-Ins: 20% Home Health: 100%      Co-Pay:  DME: 80%     Co-Ins: 20% Providers:  SECONDARY:       Policy#:      Phone#:   Development worker, community:       Phone#:   The Therapist, art Information Summary" for patients in Inpatient Rehabilitation Facilities with attached "Privacy Act Lucky Records" was provided and verbally reviewed with: Patient  Emergency Contact Information Contact Information     Name Relation Home Work Mobile   Coia,Antonio Son   [phone]   Tep,Gabriell Daughter [phone]  [phone]       Current Medical History  Patient Admitting Diagnosis: L BKA   History of Present Illness: Joseph [name] is a 75 year old right-handed male with history of diabetes mellitus, diastolic congestive heart failure, CKD stage II, 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  Presented 01/30/2021 with osteomyelitis of the left great toe followed by Dr. Sharol Given with essentially no circulation from the ankle distally.  His dorsalis pedis ABI is 0.2 posterior tibial ABI 0.  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 Sharol Given.  Wound VAC applied as directed.  Hospital course acute blood loss anemia 9.3, leukocytosis 16,400 felt to be reactive after surgery.  Noted history of CKD stage II creatinine 1.30.  Subcutaneous heparin for  DVT prophylaxis.  Therapy evaluations completed due to patient decreased functional mobility was recommended for a comprehensive rehab program.    [facility]'s [facility] record from [facility] has been reviewed by the rehabilitation admission coordinator and physician.  Past Medical History  Past Medical History:  Diagnosis Date   Cellulitis of right lower extremity    Diabetes mellitus type 2 in nonobese [facility])    Gangrene of right foot ([place])    Hypertension    Open wound of right foot     Has the patient had major surgery during 100 days prior to admission? Yes  Family History   family history includes Diabetes in his father.  Current Medications  Current Facility-Administered Medications:    0.9 %  sodium chloride infusion, 250 mL, Intravenous, PRN, Persons, Bevely Palmer, PA, Last Rate: 10 mL/hr at 01/31/21 0729, 250 mL at 01/31/21 0729   0.9 %  sodium chloride infusion, , Intravenous, Continuous, Persons, Bevely Palmer, [place], Last Rate: 75 mL/hr at 02/03/21 1437, New Bag at 02/03/21 1437   acetaminophen (TYLENOL) tablet 1,000 mg, 1,000 mg, Oral, Q8H, 1,000 mg at 02/04/21 1320 **FOLLOWED BY** [START ON 02/06/2021] acetaminophen (  TYLENOL) tablet 650 mg, 650 mg, Oral, Q6H PRN, Elodia Florence., MD   alum & mag hydroxide-simeth (MAALOX/MYLANTA) 200-200-20 MG/5ML suspension 15-30 mL, 15-30 mL, Oral, Q2H PRN, Persons, Bevely Palmer, PA   amLODipine (NORVASC) tablet 10 mg, 10 mg, Oral, Daily, Persons, Bevely Palmer, [place], 10 mg at 02/04/21 1028   ascorbic acid (VITAMIN C) tablet 1,000 mg, 1,000 mg, Oral, Daily, Persons, Bevely Palmer, PA, 1,000 mg at 02/04/21 1028   atorvastatin (LIPITOR) tablet 10 mg, 10 mg, Oral, Daily, Persons, Bevely Palmer, PA, 10 mg at 02/04/21 1028   bisacodyl (DULCOLAX) EC tablet 5 mg, 5 mg, Oral, Daily PRN, Persons, Bevely Palmer, PA   cefTRIAXone (ROCEPHIN) 2 g in sodium chloride 0.9 % 100 mL IVPB, 2 g, Intravenous, Q24H, Elodia Florence., MD, Last Rate: 200 mL/hr at 02/03/21 1735, 2 g  at 02/03/21 1735   docusate sodium (COLACE) capsule 100 mg, 100 mg, Oral, Daily, Persons, Bevely Palmer, PA, 100 mg at 02/04/21 1028   guaiFENesin-dextromethorphan (ROBITUSSIN DM) 100-10 MG/5ML syrup 15 mL, 15 mL, Oral, Q4H PRN, Persons, Bevely Palmer, [place]   [START ON 02/08/2021] heparin injection 5,000 Units, 5,000 Units, Subcutaneous, Q8H, Persons, Bevely Palmer, PA   HYDROmorphone (DILAUDID) injection 0.5 mg, 0.5 mg, Intravenous, Q4H PRN, Persons, Bevely Palmer, PA, 0.5 mg at 02/04/21 1028   insulin aspart (novoLOG) injection 0-9 Units, 0-9 Units, Subcutaneous, TID WC, Persons, Bevely Palmer, PA, 2 Units at 02/04/21 1320   magnesium citrate solution 1 Bottle, 1 Bottle, Oral, Once PRN, Persons, Bevely Palmer, PA   multivitamin with minerals tablet 1 tablet, 1 tablet, Oral, Daily, Persons, Bevely Palmer, [place], 1 tablet at 02/04/21 1028   nutrition supplement (JUVEN) (JUVEN) powder packet 1 packet, 1 packet, Oral, BID BM, Persons, Bevely Palmer, [place], 1 packet at 02/04/21 1320   oxyCODONE (Oxy IR/ROXICODONE) immediate release tablet 5 mg, 5 mg, Oral, Q4H PRN **OR** oxyCODONE (Oxy IR/ROXICODONE) immediate release tablet 10 mg, 10 mg, Oral, Q4H PRN, Elodia Florence., MD, 10 mg at 02/04/21 1320   phenol (CHLORASEPTIC) mouth spray 1 spray, 1 spray, Mouth/Throat, PRN, Persons, Bevely Palmer, PA   polyethylene glycol (MIRALAX / GLYCOLAX) packet 17 g, 17 g, Oral, Daily PRN, Persons, Bevely Palmer, PA, 17 g at 02/04/21 1055   sodium chloride flush (NS) 0.9 % injection 3 mL, 3 mL, Intravenous, Q12H, Persons, Bevely Palmer, PA, 3 mL at 02/04/21 1030   sodium chloride flush (NS) 0.9 % injection 3 mL, 3 mL, Intravenous, PRN, Persons, Bevely Palmer, PA   tranexamic acid (CYKLOKAPRON) IVPB 1,000 mg, 1,000 mg, Intravenous, Once, Persons, Bevely Palmer, PA   zinc sulfate capsule 220 mg, 220 mg, Oral, Daily, Persons, Bevely Palmer, PA, 220 mg at 02/04/21 1028  Patients Current Diet:  Diet Order             Diet - low sodium heart healthy           Diet Carb  Modified Fluid consistency: Thin; Room service appropriate? Yes  Diet effective now                   Precautions / Restrictions Precautions Precautions: Fall, Other (comment) Precaution Comments: new L BKA, chronic R BKA with prosthetic, wound vac Restrictions Weight Bearing Restrictions: Yes RLE Weight Bearing: Weight bearing as tolerated RLE Partial Weight Bearing Percentage or Pounds:  (prosthetic leg RLE) LLE Weight Bearing: Non weight bearing   Has the patient had 2 or more falls or a fall with  injury in the past year? No  Prior Activity Level Community (5-7x/wk): full independent with R prosthesis and no ambulatory aid.  Driving.  Prior Functional Level Self Care: Did the patient need help bathing, dressing, using the toilet or eating? Independent  Indoor Mobility: Did the patient need assistance with walking from room to room (with or without device)? Independent  Stairs: Did the patient need assistance with internal or external stairs (with or without device)? Independent  Functional Cognition: Did the patient need help planning regular tasks such as shopping or remembering to take medications? Independent  Patient Information Are you of Hispanic, Latino/a,or Spanish origin?: A. No, not of Hispanic, Latino/a, or Spanish origin What is your race?: B. Black or African American Do you need or want an interpreter to communicate with a doctor or health care staff?: 0. No  Patient's Response To:  Health Literacy and Transportation Is the patient able to respond to health literacy and transportation needs?: Yes Health Literacy - How often do you need to have someone help you when you read instructions, pamphlets, or other written material from your doctor or pharmacy?: Never In the past 12 months, has lack of transportation kept you from medical appointments or from getting medications?: No In the past 12 months, has lack of transportation kept you from meetings, work, or  from getting things needed for daily living?: No  Development worker, international aid / [place] Devices/Equipment: Prosthesis, Wheelchair, Environmental consultant (specify type), Cane (specify quad or straight) Home Equipment: Bedside commode, Grab bars - toilet, Grab bars - tub/shower, Walker - 2 wheels, Cane - single point  Prior Device Use: Indicate devices/aids used by the patient prior to current illness, exacerbation or injury? Orthotics/Prosthetics  Current Functional Level Cognition  Overall Cognitive Status: Within Functional Limits for tasks assessed Orientation Level: Oriented X4    Extremity Assessment (includes Sensation/Coordination)  Upper Extremity Assessment: Defer to OT evaluation  Lower Extremity Assessment: Generalized weakness    ADLs       Mobility  Overal bed mobility: Needs Assistance Bed Mobility: Supine to Sit, Sit to Supine Supine to sit: Supervision, HOB elevated Sit to supine: Supervision, HOB elevated General bed mobility comments: HOB elevated, + rail    Transfers  Overall transfer level: Needs assistance Equipment used: Rolling walker (2 wheeled) Transfers: Sit to/from Stand Sit to Stand: Mod assist, From elevated surface General transfer comment: needed heavy modA even from significantly elevated surface, increased time and effort to steady    Ambulation / Gait / Stairs / Wheelchair Mobility  Ambulation/Gait Ambulation/Gait assistance: Herbalist (Feet): 10 Feet Assistive device: Rolling walker (2 wheeled) Gait Pattern/deviations:  (hop to pattern) General Gait Details: hop to pattern with bilateral lateral unsteadiness requiring MinA to stabilize Gait velocity: decreased    Posture / Balance Balance Overall balance assessment: Needs assistance Sitting-balance support: Bilateral upper extremity supported, Feet supported Sitting balance-Leahy Scale: Good Standing balance support: Bilateral upper extremity supported, During functional  activity Standing balance-Leahy Scale: Poor Standing balance comment: reliant on BUE support and MinA for dynamic balance    Special needs/care consideration Wound Vac LLE residual limb and Diabetic management yes   Previous Home Environment (from acute therapy documentation) Living Arrangements: Alone Available Help at Discharge: Family, Friend(s), Available PRN/intermittently, Other (Comment) (but has someone moving in who can live htere 24/7) Type of Home: House Home Layout: Two level Alternate Level Stairs-Number of Steps: has a ramp inside the home Home Access: Ramped entrance Bathroom Shower/Tub: Tub/shower unit  Bathroom Toilet: Standard Home Care Services: No  Discharge Living Setting Plans for Discharge Living Setting: Alone Type of Home at Discharge: House Discharge Home Layout: Two level Discharge Home Access: [place] entrance Discharge Bathroom Shower/Tub: [place] unit Discharge Bathroom Toilet: Standard Discharge Bathroom Accessibility: Yes How Accessible: Accessible via walker Does the patient have any problems obtaining your medications?: No  Social/Family/Support Systems Anticipated Caregiver: mod I goals, call Silvestre Mesi (daughter) for updates.  Neighbors available for PRN assist if needed.  Children available for 24/7 if necessary Anticipated Caregiver's Contact Information: Silvestre Mesi [phone] Ability/Limitations of Caregiver: n/a Caregiver Availability:  (friends/neighbors PRN, children 24/7) Discharge Plan Discussed with Primary Caregiver: No Is Caregiver In Agreement with Plan?: Yes Does Caregiver/Family have Issues with Lodging/Transportation while Pt is in Rehab?: No  Goals Patient/Family Goal for Rehab: PT/OT mod I, SLP n/a Expected length of stay: 9-12 days Additional Information: on CIR 2020 for R BKA Pt/Family Agrees to Admission and willing to participate: Yes Program Orientation Provided & Reviewed with Pt/Caregiver Including Roles  &  Responsibilities: Yes  Decrease burden of Care through IP rehab admission: n/a  Possible need for SNF placement upon discharge: No  Patient Condition: I have reviewed medical records from [facility], spoken with CM, and patient. I met with patient at the bedside for inpatient rehabilitation assessment.  Patient will benefit from ongoing PT and OT, can actively participate in 3 hours of therapy a day 5 days of the week, and can make measurable gains during the admission.  Patient will also benefit from the coordinated team approach during an Inpatient Acute Rehabilitation admission.  The patient will receive intensive therapy as well as Rehabilitation physician, nursing, social worker, and care management interventions.  Due to safety, skin/wound care, disease management, medication administration, pain management, and patient education the patient requires 24 hour a day rehabilitation nursing.  The patient is currently min assist with mobility and basic ADLs.  Discharge setting and therapy post discharge at home with home health is anticipated.  Patient has agreed to participate in the Acute Inpatient Rehabilitation Program and will admit today.  Preadmission Screen Completed By:  Michel Santee, PT, DPT 02/04/2021 1:43 PM ______________________________________________________________________   Discussed status with Dr. Dagoberto Ligas on 02/04/21  at 1:47 PM  and received approval for admission today.  Admission Coordinator:  Michel Santee, PT, DPT time 1:47 PM Sudie Grumbling 02/04/21    Assessment/Plan: Diagnosis: Does the need for close, 24 hr/day Medical supervision in concert with the patient's rehab needs make it unreasonable for this patient to be served in a less intensive setting? Yes Co-Morbidities requiring supervision/potential complications: L BKA with old R BKA; constipation- leukocytosis Due to bladder management, bowel management, safety, skin/wound care, disease management, medication  administration, pain management, and patient education, does the patient require 24 hr/day rehab nursing? Yes Does the patient require coordinated care of a physician, rehab nurse, PT, OT, and SLP to address physical and functional deficits in the context of the above medical diagnosis(es)? Yes Addressing deficits in the following areas: balance, endurance, locomotion, strength, transferring, bathing, dressing, feeding, grooming, and toileting Can the patient actively participate in an intensive therapy program of at least 3 hrs of therapy 5 days a week? Yes The potential for patient to make measurable gains while on inpatient rehab is good Anticipated functional outcomes upon discharge from inpatient rehab: modified independent PT, modified independent OT, n/a SLP Estimated rehab length of stay to reach the above functional goals is: 9-12 days Anticipated discharge destination:  Home 10. Overall Rehab/Functional Prognosis: good   MD Signature:

## 2021-02-04 NOTE — Progress Notes (Signed)
Physical Therapy Treatment Patient Details Name: Joseph [name] MRN: [id number] DOB: 08-Jun-1945 Today's Date: 02/04/2021    History of Present Illness 75 yo male s/p L BKA 02/03/21. PMH DM, HTN, R BKA    PT Comments    Patient received in bed, pleasant and motivated to participate. Able to progress gait distance today, however very easily fatigued and required one sitting rest break due to increasing unsteadiness/safety concerns with increased fatigue. Needs heavy cues for hand placement and sequencing. Curious about if he will need a wheelchair after this amputation, educated that it may be a valuable tool in achieving functional independence while his residual limb heals/while he builds strength and endurance, and he will learn more about DME reccs and best mode of mobility in CIR.  Noted open sore on his right chronic BKA which has been there for weeks now per his report- may need adjustment of prosthetic.  Left in bed with all needs met, bed alarm active and RN aware of pt status. Will continue efforts.    Follow Up Recommendations  CIR;Supervision for mobility/OOB     Equipment Recommendations  Rolling walker with 5" wheels;3in1 (PT);Wheelchair (measurements PT);Wheelchair cushion (measurements PT)    Recommendations for Other Services       Precautions / Restrictions Precautions Precautions: Fall;Other (comment) Precaution Comments: new L BKA, chronic R BKA with prosthetic, wound vac Restrictions Weight Bearing Restrictions: Yes RLE Weight Bearing: Weight bearing as tolerated LLE Weight Bearing: Non weight bearing    Mobility  Bed Mobility Overal bed mobility: Needs Assistance Bed Mobility: Supine to Sit;Sit to Supine     Supine to sit: Supervision;HOB elevated Sit to supine: Supervision;HOB elevated   General bed mobility comments: HOB elevated, + rail    Transfers Overall transfer level: Needs assistance Equipment used: Rolling walker (2 wheeled) Transfers: Sit  to/from Stand Sit to Stand: Max assist;From elevated surface         General transfer comment: needs MaxA to boost up to standing from elevated surface today as well as Max cues for hand placement and sequencing; needs MaxAx2 to boost to standing from standard height chair  Ambulation/Gait Ambulation/Gait assistance: Min guard Gait Distance (Feet): 24 Feet (34f, 438f Assistive device: Rolling walker (2 wheeled) Gait Pattern/deviations:  (hop to pattern) Gait velocity: decreased   General Gait Details: hop to pattern, more steady today, just easily fatigued- ended up needing to take a sitting rest break in standard height chair due to fatigue/increasing unsteadiness   Stairs             Wheelchair Mobility    Modified Rankin (Stroke Patients Only)       Balance Overall balance assessment: Needs assistance Sitting-balance support: Bilateral upper extremity supported;Feet supported Sitting balance-Leahy Scale: Good     Standing balance support: Bilateral upper extremity supported;During functional activity Standing balance-Leahy Scale: Poor Standing balance comment: reliant on BUE support and MinA for dynamic balance                            Cognition Arousal/Alertness: Awake/alert Behavior During Therapy: WFL for tasks assessed/performed Overall Cognitive Status: Within Functional Limits for tasks assessed                                        Exercises      General Comments  Pertinent Vitals/Pain Pain Assessment: Faces Faces Pain Scale: Hurts little more Pain Location: amputation site Pain Descriptors / Indicators: Aching;Sore Pain Intervention(s): Limited activity within patient's tolerance;Repositioned    Home Living                      Prior Function            PT Goals (current goals can now be found in the care plan section) Acute Rehab PT Goals Patient Stated Goal: back to rehab after this new  amputation PT Goal Formulation: With patient Time For Goal Achievement: 02/17/21 Potential to Achieve Goals: Good Progress towards PT goals: Progressing toward goals    Frequency    Min 4X/week      PT Plan Current plan remains appropriate    Co-evaluation              AM-PAC PT "6 Clicks" Mobility   Outcome Measure  Help needed turning from your back to your side while in a flat bed without using bedrails?: A Little Help needed moving from lying on your back to sitting on the side of a flat bed without using bedrails?: A Little Help needed moving to and from a bed to a chair (including a wheelchair)?: A Little Help needed standing up from a chair using your arms (e.g., wheelchair or bedside chair)?: A Lot Help needed to walk in hospital room?: A Little Help needed climbing 3-5 steps with a railing? : Total 6 Click Score: 15    End of Session Equipment Utilized During Treatment: Gait belt Activity Tolerance: Patient tolerated treatment well Patient left: in bed;with call bell/phone within reach;with bed alarm set Nurse Communication: Mobility status PT Visit Diagnosis: Unsteadiness on feet (R26.81);Muscle weakness (generalized) (M62.81);Difficulty in walking, not elsewhere classified (R26.2)     Time: 3825-0539 PT Time Calculation (min) (ACUTE ONLY): 30 min  Charges:  $Gait Training: 8-22 mins $Therapeutic Activity: 8-22 mins                    Windell Norfolk, DPT, PN2   Supplemental Physical Therapist [place]    Pager [phone] Acute Rehab Office [phone]

## 2021-02-04 NOTE — Progress Notes (Signed)
PMR Admission Coordinator Pre-Admission Assessment  Patient: Joseph [name] is an 75 y.o., male MRN: [id number] DOB: 12/12/1945 Height: 6' 3" (190.5 cm) Weight: 100 kg  Insurance Information HMO:     PPO:      PCP:      IPA:      80/20:      OTHER:  PRIMARY: Medicare A/B      Policy#: [id number]      Subscriber: pt CM Name:       Phone#:      Fax#:  Pre-Cert#: verified online      Employer:  Benefits:  Phone #:      Name:  Eff. Date: A 10/28/12, B 04/29/18     Deduct: $1556      Out of Pocket Max: n/a      Life Max: n/a CIR: 100%      SNF: 20 full days Outpatient: 80%     Co-Ins: 20% Home Health: 100%      Co-Pay:  DME: 80%     Co-Ins: 20% Providers:  SECONDARY:       Policy#:      Phone#:   Financial Counselor:       Phone#:   The "Data Collection Information Summary" for patients in Inpatient Rehabilitation Facilities with attached "Privacy Act Statement-Health Care Records" was provided and verbally reviewed with: Patient  Emergency Contact Information Contact Information     Name Relation Home Work Mobile   Bang,Antonio Son   [phone]   Kanady,Gabriell Daughter [phone]  [phone]       Current Medical History  Patient Admitting Diagnosis: L BKA   History of Present Illness: Joseph [name] is a 75-year-old right-handed male with history of diabetes mellitus, diastolic congestive heart failure, CKD stage II, 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  Presented 01/30/2021 with osteomyelitis of the left great toe followed by Dr. Duda with essentially no circulation from the ankle distally.  His dorsalis pedis ABI is 0.2 posterior tibial ABI 0.  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 Duda.  Wound VAC applied as directed.  Hospital course acute blood loss anemia 9.3, leukocytosis 16,400 felt to be reactive after surgery.  Noted history of CKD stage II creatinine 1.30.  Subcutaneous heparin for  DVT prophylaxis.  Therapy evaluations completed due to patient decreased functional mobility was recommended for a comprehensive rehab program.    Patient's medical record from [place] has been reviewed by the rehabilitation admission coordinator and physician.  Past Medical History  Past Medical History:  Diagnosis Date   Cellulitis of right lower extremity    Diabetes mellitus type 2 in nonobese (HCC)    Gangrene of right foot (HCC)    Hypertension    Open wound of right foot     Has the patient had major surgery during 100 days prior to admission? Yes  Family History   family history includes Diabetes in his father.  Current Medications  Current Facility-Administered Medications:    0.9 %  sodium chloride infusion, 250 mL, Intravenous, PRN, Persons, Mary Anne, PA, Last Rate: 10 mL/hr at 01/31/21 0729, 250 mL at 01/31/21 0729   0.9 %  sodium chloride infusion, , Intravenous, Continuous, Persons, Mary Anne, PA, Last Rate: 75 mL/hr at 02/03/21 1437, New Bag at 02/03/21 1437   acetaminophen (TYLENOL) tablet 1,000 mg, 1,000 mg, Oral, Q8H, 1,000 mg at 02/04/21 1320 **FOLLOWED BY** [START ON 02/06/2021] acetaminophen (  TYLENOL) tablet 650 mg, 650 mg, Oral, Q6H PRN, Powell, A Caldwell Jr., MD   alum & mag hydroxide-simeth (MAALOX/MYLANTA) 200-200-20 MG/5ML suspension 15-30 mL, 15-30 mL, Oral, Q2H PRN, Persons, Mary Anne, PA   amLODipine (NORVASC) tablet 10 mg, 10 mg, Oral, Daily, Persons, Mary Anne, PA, 10 mg at 02/04/21 1028   ascorbic acid (VITAMIN C) tablet 1,000 mg, 1,000 mg, Oral, Daily, Persons, Mary Anne, PA, 1,000 mg at 02/04/21 1028   atorvastatin (LIPITOR) tablet 10 mg, 10 mg, Oral, Daily, Persons, Mary Anne, PA, 10 mg at 02/04/21 1028   bisacodyl (DULCOLAX) EC tablet 5 mg, 5 mg, Oral, Daily PRN, Persons, Mary Anne, PA   cefTRIAXone (ROCEPHIN) 2 g in sodium chloride 0.9 % 100 mL IVPB, 2 g, Intravenous, Q24H, Powell, A Caldwell Jr., MD, Last Rate: 200 mL/hr at 02/03/21 1735, 2 g  at 02/03/21 1735   docusate sodium (COLACE) capsule 100 mg, 100 mg, Oral, Daily, Persons, Mary Anne, PA, 100 mg at 02/04/21 1028   guaiFENesin-dextromethorphan (ROBITUSSIN DM) 100-10 MG/5ML syrup 15 mL, 15 mL, Oral, Q4H PRN, Persons, Mary Anne, PA   [START ON 02/08/2021] heparin injection 5,000 Units, 5,000 Units, Subcutaneous, Q8H, Persons, Mary Anne, PA   HYDROmorphone (DILAUDID) injection 0.5 mg, 0.5 mg, Intravenous, Q4H PRN, Persons, Mary Anne, PA, 0.5 mg at 02/04/21 1028   insulin aspart (novoLOG) injection 0-9 Units, 0-9 Units, Subcutaneous, TID WC, Persons, Mary Anne, PA, 2 Units at 02/04/21 1320   magnesium citrate solution 1 Bottle, 1 Bottle, Oral, Once PRN, Persons, Mary Anne, PA   multivitamin with minerals tablet 1 tablet, 1 tablet, Oral, Daily, Persons, Mary Anne, PA, 1 tablet at 02/04/21 1028   nutrition supplement (JUVEN) (JUVEN) powder packet 1 packet, 1 packet, Oral, BID BM, Persons, Mary Anne, PA, 1 packet at 02/04/21 1320   oxyCODONE (Oxy IR/ROXICODONE) immediate release tablet 5 mg, 5 mg, Oral, Q4H PRN **OR** oxyCODONE (Oxy IR/ROXICODONE) immediate release tablet 10 mg, 10 mg, Oral, Q4H PRN, Powell, A Caldwell Jr., MD, 10 mg at 02/04/21 1320   phenol (CHLORASEPTIC) mouth spray 1 spray, 1 spray, Mouth/Throat, PRN, Persons, Mary Anne, PA   polyethylene glycol (MIRALAX / GLYCOLAX) packet 17 g, 17 g, Oral, Daily PRN, Persons, Mary Anne, PA, 17 g at 02/04/21 1055   sodium chloride flush (NS) 0.9 % injection 3 mL, 3 mL, Intravenous, Q12H, Persons, Mary Anne, PA, 3 mL at 02/04/21 1030   sodium chloride flush (NS) 0.9 % injection 3 mL, 3 mL, Intravenous, PRN, Persons, Mary Anne, PA   tranexamic acid (CYKLOKAPRON) IVPB 1,000 mg, 1,000 mg, Intravenous, Once, Persons, Mary Anne, PA   zinc sulfate capsule 220 mg, 220 mg, Oral, Daily, Persons, Mary Anne, PA, 220 mg at 02/04/21 1028  Patients Current Diet:  Diet Order             Diet - low sodium heart healthy           Diet Carb  Modified Fluid consistency: Thin; Room service appropriate? Yes  Diet effective now                   Precautions / Restrictions Precautions Precautions: Fall, Other (comment) Precaution Comments: new L BKA, chronic R BKA with prosthetic, wound vac Restrictions Weight Bearing Restrictions: Yes RLE Weight Bearing: Weight bearing as tolerated RLE Partial Weight Bearing Percentage or Pounds:  (prosthetic leg RLE) LLE Weight Bearing: Non weight bearing   Has the patient had 2 or more falls or a fall with   injury in the past year? No  Prior Activity Level Community (5-7x/wk): full independent with R prosthesis and no ambulatory aid.  Driving.  Prior Functional Level Self Care: Did the patient need help bathing, dressing, using the toilet or eating? Independent  Indoor Mobility: Did the patient need assistance with walking from room to room (with or without device)? Independent  Stairs: Did the patient need assistance with internal or external stairs (with or without device)? Independent  Functional Cognition: Did the patient need help planning regular tasks such as shopping or remembering to take medications? Independent  Patient Information Are you of Hispanic, Latino/a,or Spanish origin?: A. No, not of Hispanic, Latino/a, or Spanish origin What is your race?: B. Black or African American Do you need or want an interpreter to communicate with a doctor or health care staff?: 0. No  Patient's Response To:  Health Literacy and Transportation Is the patient able to respond to health literacy and transportation needs?: Yes Health Literacy - How often do you need to have someone help you when you read instructions, pamphlets, or other written material from your doctor or pharmacy?: Never In the past 12 months, has lack of transportation kept you from medical appointments or from getting medications?: No In the past 12 months, has lack of transportation kept you from meetings, work, or  from getting things needed for daily living?: No  Home Assistive Devices / Equipment Home Assistive Devices/Equipment: Prosthesis, Wheelchair, Walker (specify type), Cane (specify quad or straight) Home Equipment: Bedside commode, Grab bars - toilet, Grab bars - tub/shower, Walker - 2 wheels, Cane - single point  Prior Device Use: Indicate devices/aids used by the patient prior to current illness, exacerbation or injury? Orthotics/Prosthetics  Current Functional Level Cognition  Overall Cognitive Status: Within Functional Limits for tasks assessed Orientation Level: Oriented X4    Extremity Assessment (includes Sensation/Coordination)  Upper Extremity Assessment: Defer to OT evaluation  Lower Extremity Assessment: Generalized weakness    ADLs       Mobility  Overal bed mobility: Needs Assistance Bed Mobility: Supine to Sit, Sit to Supine Supine to sit: Supervision, HOB elevated Sit to supine: Supervision, HOB elevated General bed mobility comments: HOB elevated, + rail    Transfers  Overall transfer level: Needs assistance Equipment used: Rolling walker (2 wheeled) Transfers: Sit to/from Stand Sit to Stand: Mod assist, From elevated surface General transfer comment: needed heavy modA even from significantly elevated surface, increased time and effort to steady    Ambulation / Gait / Stairs / Wheelchair Mobility  Ambulation/Gait Ambulation/Gait assistance: Min assist Gait Distance (Feet): 10 Feet Assistive device: Rolling walker (2 wheeled) Gait Pattern/deviations:  (hop to pattern) General Gait Details: hop to pattern with bilateral lateral unsteadiness requiring MinA to stabilize Gait velocity: decreased    Posture / Balance Balance Overall balance assessment: Needs assistance Sitting-balance support: Bilateral upper extremity supported, Feet supported Sitting balance-Leahy Scale: Good Standing balance support: Bilateral upper extremity supported, During functional  activity Standing balance-Leahy Scale: Poor Standing balance comment: reliant on BUE support and MinA for dynamic balance    Special needs/care consideration Wound Vac LLE residual limb and Diabetic management yes   Previous Home Environment (from acute therapy documentation) Living Arrangements: Alone Available Help at Discharge: Family, Friend(s), Available PRN/intermittently, Other (Comment) (but has someone moving in who can live htere 24/7) Type of Home: House Home Layout: Two level Alternate Level Stairs-Number of Steps: has a ramp inside the home Home Access: Ramped entrance Bathroom Shower/Tub: Tub/shower unit   Bathroom Toilet: Standard Home Care Services: No  Discharge Living Setting Plans for Discharge Living Setting: Alone Type of Home at Discharge: House Discharge Home Layout: Two level Discharge Home Access: Ramped entrance Discharge Bathroom Shower/Tub: Tub/shower unit Discharge Bathroom Toilet: Standard Discharge Bathroom Accessibility: Yes How Accessible: Accessible via walker Does the patient have any problems obtaining your medications?: No  Social/Family/Support Systems Anticipated Caregiver: mod I goals, call Gabriell (daughter) for updates.  Neighbors available for PRN assist if needed.  Children available for 24/7 if necessary Anticipated Caregiver's Contact Information: Gabriell [phone] Ability/Limitations of Caregiver: n/a Caregiver Availability:  (friends/neighbors PRN, children 24/7) Discharge Plan Discussed with Primary Caregiver: No Is Caregiver In Agreement with Plan?: Yes Does Caregiver/Family have Issues with Lodging/Transportation while Pt is in Rehab?: No  Goals Patient/Family Goal for Rehab: PT/OT mod I, SLP n/a Expected length of stay: 9-12 days Additional Information: on CIR 2020 for R BKA Pt/Family Agrees to Admission and willing to participate: Yes Program Orientation Provided & Reviewed with Pt/Caregiver Including Roles  &  Responsibilities: Yes  Decrease burden of Care through IP rehab admission: n/a  Possible need for SNF placement upon discharge: No  Patient Condition: I have reviewed medical records from [place], spoken with CM, and patient. I met with patient at the bedside for inpatient rehabilitation assessment.  Patient will benefit from ongoing PT and OT, can actively participate in 3 hours of therapy a day 5 days of the week, and can make measurable gains during the admission.  Patient will also benefit from the coordinated team approach during an Inpatient Acute Rehabilitation admission.  The patient will receive intensive therapy as well as Rehabilitation physician, nursing, social worker, and care management interventions.  Due to safety, skin/wound care, disease management, medication administration, pain management, and patient education the patient requires 24 hour a day rehabilitation nursing.  The patient is currently min assist with mobility and basic ADLs.  Discharge setting and therapy post discharge at home with home health is anticipated.  Patient has agreed to participate in the Acute Inpatient Rehabilitation Program and will admit today.  Preadmission Screen Completed By:  Zetha Kuhar E Chade Pitner, PT, DPT 02/04/2021 1:43 PM ______________________________________________________________________   Discussed status with Dr. Lovorn on 02/04/21  at 1:47 PM  and received approval for admission today.  Admission Coordinator:  Banyan Goodchild E Noreene Boreman, PT, DPT time 1:47 PM /Date 02/04/21    Assessment/Plan: Diagnosis: Does the need for close, 24 hr/day Medical supervision in concert with the patient's rehab needs make it unreasonable for this patient to be served in a less intensive setting? Yes Co-Morbidities requiring supervision/potential complications: L BKA with old R BKA; constipation- leukocytosis Due to bladder management, bowel management, safety, skin/wound care, disease management, medication  administration, pain management, and patient education, does the patient require 24 hr/day rehab nursing? Yes Does the patient require coordinated care of a physician, rehab nurse, PT, OT, and SLP to address physical and functional deficits in the context of the above medical diagnosis(es)? Yes Addressing deficits in the following areas: balance, endurance, locomotion, strength, transferring, bathing, dressing, feeding, grooming, and toileting Can the patient actively participate in an intensive therapy program of at least 3 hrs of therapy 5 days a week? Yes The potential for patient to make measurable gains while on inpatient rehab is good Anticipated functional outcomes upon discharge from inpatient rehab: modified independent PT, modified independent OT, n/a SLP Estimated rehab length of stay to reach the above functional goals is: 9-12 days Anticipated discharge destination:   Home 10. Overall Rehab/Functional Prognosis: good   MD Signature:  

## 2021-02-05 DIAGNOSIS — Z89512 Acquired absence of left leg below knee: Secondary | ICD-10-CM | POA: Diagnosis not present

## 2021-02-05 DIAGNOSIS — Z89511 Acquired absence of right leg below knee: Secondary | ICD-10-CM | POA: Diagnosis not present

## 2021-02-05 LAB — COMPREHENSIVE METABOLIC PANEL
ALT: 61 U/L — ABNORMAL HIGH (ref 0–44)
AST: 58 U/L — ABNORMAL HIGH (ref 15–41)
Albumin: 2.5 g/dL — ABNORMAL LOW (ref 3.5–5.0)
Alkaline Phosphatase: 69 U/L (ref 38–126)
Anion gap: 7 (ref 5–15)
BUN: 16 mg/dL (ref 8–23)
CO2: 25 mmol/L (ref 22–32)
Calcium: 9.2 mg/dL (ref 8.9–10.3)
Chloride: 102 mmol/L (ref 98–111)
Creatinine, Ser: 1.24 mg/dL (ref 0.61–1.24)
GFR, Estimated: >60 mL/min (ref >60)
Glucose, Bld: 123 mg/dL — ABNORMAL HIGH (ref 70–99)
Potassium: 3.9 mmol/L (ref 3.5–5.1)
Sodium: 134 mmol/L — ABNORMAL LOW (ref 135–145)
Total Bilirubin: 0.3 mg/dL (ref 0.3–1.2)
Total Protein: 7 g/dL (ref 6.5–8.1)

## 2021-02-05 LAB — CBC WITH DIFFERENTIAL/PLATELET
Abs Immature Granulocytes: 0 10*3/uL (ref 0.00–0.07)
Basophils Absolute: 0 10*3/uL (ref 0.0–0.1)
Basophils Relative: 0 %
Eosinophils Absolute: 0.2 10*3/uL (ref 0.0–0.5)
Eosinophils Relative: 1 %
HCT: 28.6 % — ABNORMAL LOW (ref 39.0–52.0)
Hemoglobin: 9.4 g/dL — ABNORMAL LOW (ref 13.0–17.0)
Lymphocytes Relative: 21 %
Lymphs Abs: 3.2 10*3/uL (ref 0.7–4.0)
MCH: 30 pg (ref 26.0–34.0)
MCHC: 32.9 g/dL (ref 30.0–36.0)
MCV: 91.4 fL (ref 80.0–100.0)
Monocytes Absolute: 0.8 10*3/uL (ref 0.1–1.0)
Monocytes Relative: 5 %
Neutro Abs: 11.2 10*3/uL — ABNORMAL HIGH (ref 1.7–7.7)
Neutrophils Relative %: 73 %
Platelets: 381 10*3/uL (ref 150–400)
RBC: 3.13 MIL/uL — ABNORMAL LOW (ref 4.22–5.81)
RDW: 12.8 % (ref 11.5–15.5)
Smear Review: NORMAL
WBC: 15.3 10*3/uL — ABNORMAL HIGH (ref 4.0–10.5)
nRBC: 0 % (ref 0.0–0.2)

## 2021-02-05 LAB — SURGICAL PATHOLOGY

## 2021-02-05 LAB — GLUCOSE, CAPILLARY
Glucose-Capillary: 108 mg/dL — ABNORMAL HIGH (ref 70–99)
Glucose-Capillary: 171 mg/dL — ABNORMAL HIGH (ref 70–99)
Glucose-Capillary: 130 mg/dL — ABNORMAL HIGH (ref 70–99)
Glucose-Capillary: 143 mg/dL — ABNORMAL HIGH (ref 70–99)

## 2021-02-05 MED ORDER — SORBITOL 70 % SOLN
30.0000 mL | Freq: Once | Status: AC
  Administered 2021-02-05: 30 mL via ORAL
  Filled 2021-02-05: qty 30

## 2021-02-05 NOTE — Progress Notes (Signed)
Inpatient Rehabilitation  Patient information reviewed and entered into eRehab system by Amiracle Neises M. Cynde Menard, M.A., CCC/SLP, PPS Coordinator.  Information including medical coding, functional ability and quality indicators will be reviewed and updated through discharge.    

## 2021-02-05 NOTE — Progress Notes (Signed)
Inpatient Rehabilitation Center Individual Statement of Services  Patient Name:  Joseph [name]  Date:  02/05/2021  Welcome to the Inpatient Rehabilitation Center.  Our goal is to provide you with an individualized program based on your diagnosis and situation, designed to meet your specific needs.  With this comprehensive rehabilitation program, you will be expected to participate in at least 3 hours of rehabilitation therapies Monday-Friday, with modified therapy programming on the weekends.  Your rehabilitation program will include the following services:  Physical Therapy (PT), Occupational Therapy (OT), Speech Therapy (ST), 24 hour per day rehabilitation nursing, Therapeutic Recreaction (TR), Neuropsychology, Care Coordinator, Rehabilitation Medicine, Nutrition Services, Pharmacy Services, and Other  Weekly team conferences will be held on Tuesdays  to discuss your progress.  Your Inpatient Rehabilitation Care Coordinator will talk with you frequently to get your input and to update you on team discussions.  Team conferences with you and your family in attendance may also be held.  Expected length of stay: 9-12 Days  Overall anticipated outcome:  MOD I  Depending on your progress and recovery, your program may change. Your Inpatient Rehabilitation Care Coordinator will coordinate services and will keep you informed of any changes. Your Inpatient Rehabilitation Care Coordinator's name and contact numbers are listed  below.  The following services may also be recommended but are not provided by the Inpatient Rehabilitation Center:   Home Health Rehabiltiation Services Outpatient Rehabilitation Services    Arrangements will be made to provide these services after discharge if needed.  Arrangements include referral to agencies that provide these services.  Your insurance has been verified to be:  Medicare A & B Your primary doctor is:   Hoy Register, MD  Pertinent information will be shared  with your doctor and your insurance company.  Inpatient Rehabilitation Care Coordinator:  Lavera Guise, [place] [phone] or [phone]  Information discussed with and copy given to patient by: Andria Rhein, 02/05/2021, 12:12 PM

## 2021-02-05 NOTE — Discharge Instructions (Addendum)
Inpatient Rehab Discharge Instructions  Joseph [name] Discharge date and time: No discharge date for patient encounter.   Activities/Precautions/ Functional Status: Activity: activity as tolerated Diet: diabetic diet Wound Care: Routine skin checks.  Cleanse amputation site daily antibacterial soap/Dial Functional status:  ___ No restrictions     ___ Walk up steps independently ___ 24/7 supervision/assistance   ___ Walk up steps with assistance ___ Intermittent supervision/assistance  ___ Bathe/dress independently ___ Walk with walker     _x__ Bathe/dress with assistance ___ Walk Independently    ___ Shower independently ___ Walk with assistance    ___ Shower with assistance ___ No alcohol     ___ Return to work/school ________  COMMUNITY REFERRALS UPON DISCHARGE:    Home Health:   PT     OT       RN    SNA                    Agency: [facility]  Phone: [phone]    Medical Equipment/Items Ordered: Bedside Commode                                                 Agency/Supplier: [employer]   Special Instructions: No driving smoking or alcohol   My questions have been answered and I understand these instructions. I will adhere to these goals and the provided educational materials after my discharge from the hospital.  Patient/Caregiver Signature _______________________________ Date __________  Clinician Signature _______________________________________ Date __________  Please bring this form and your medication list with you to all your follow-up doctor's appointments.

## 2021-02-05 NOTE — Progress Notes (Signed)
CH attempted visit per [facility] consult to discuss AD; pt. in the middle of taking a bath per staff.  Chaplains will follow up as able.

## 2021-02-05 NOTE — Progress Notes (Signed)
Inpatient Rehabilitation Care Coordinator Assessment and Plan Patient Details  Name: Joseph [name] MRN: [id number] Date of Birth: 01-25-1946  Today's Date: 02/05/2021  Hospital Problems: Principal Problem:   S/P bilateral below knee amputation [facility])  Past Medical History:  Past Medical History:  Diagnosis Date   Cellulitis of right lower extremity    Diabetes mellitus type 2 in nonobese [facility])    Gangrene of right foot ([place])    Hypertension    Open wound of right foot    Past Surgical History:  Past Surgical History:  Procedure Laterality Date   ABDOMINAL AORTOGRAM W/LOWER EXTREMITY Left 01/27/2021   Procedure: ABDOMINAL AORTOGRAM W/LOWER EXTREMITY;  Surgeon: Cherre Robins, MD;  Location: [place] CV LAB;  Service: Cardiovascular;  Laterality: Left;   AMPUTATION Right 11/16/2018   Procedure: RIGHT BELOW KNEE AMPUTATION;  Surgeon: Newt Minion, MD;  Location: [place];  Service: Orthopedics;  Laterality: Right;   AMPUTATION Left 02/03/2021   Procedure: LEFT BELOW KNEE AMPUTATION;  Surgeon: Newt Minion, MD;  Location: [place];  Service: Orthopedics;  Laterality: Left;   APPLICATION OF WOUND VAC  02/03/2021   Procedure: APPLICATION OF WOUND VAC;  Surgeon: Newt Minion, MD;  Location: [place];  Service: Orthopedics;;   HAND SURGERY     Social History:  reports that he has never smoked. He has never used smokeless tobacco. He reports current alcohol use. He reports that he does not use drugs.  Family / Support Systems Marital Status: Single Children: Antonio (son), Environmental consultant (dtr) Anticipated Caregiver: Silvestre Mesi (Dtr) Ability/Limitations of Caregiver: Children avalibale for 24/7 if needed PRN A from friends and neighbors Caregiver Availability: Intermittent Family Dynamics: some support from friends, neighbords and children  Social History Preferred language: English Religion: Technical brewer - How often do you need to have someone help you when you read instructions,  pamphlets, or other written material from your doctor or pharmacy?: Never Legal History/Current Legal Issues: n/a Guardian/Conservator: n/a   Abuse/Neglect Abuse/Neglect Assessment Can Be Completed: Yes Physical Abuse: Denies Verbal Abuse: Denies Sexual Abuse: Denies Exploitation of patient/patient's resources: Denies Self-Neglect: Denies  Patient response to: Social Isolation - How often do you feel lonely or isolated from those around you?: Never  Emotional Status Pt's affect, behavior and adjustment status: Patient pleasant concerned about being a burden to children Recent Psychosocial Issues: n/a Psychiatric History: n/a Substance Abuse History: n/a  Patient / Family Perceptions, Expectations & Goals Pt/Family understanding of illness & functional limitations: yes Premorbid pt/family roles/activities: Patient was fully independent before AD Anticipated changes in roles/activities/participation: Children able to assisit if needed Pt/family expectations/goals: [place]: None Premorbid Home Care/DME Agencies: Other (Comment) (WC, Cane, Bedside Commode, Education officer, environmental) Transportation available at discharge: family able to transpoirt Is the patient able to respond to transportation needs?: Yes In the past 12 months, has lack of transportation kept you from medical appointments or from getting medications?: No In the past 12 months, has lack of transportation kept you from meetings, work, or from getting things needed for daily living?: No Resource referrals recommended: Neuropsychology (coping)  Discharge Planning Living Arrangements: Alone Support Systems: Children Type of Residence: Private residence (2 level home, ramp entrance) Insurance Resources: Medicare Financial Screen Referred: No Living Expenses: Own Money Management: Patient Does the patient have any problems obtaining your medications?: No Home Management:  Independent Patient/Family Preliminary Plans: Pt daughter able to assist if needed Care Coordinator Barriers to Discharge: [place] Coordinator  Anticipated Follow Up Needs: HH/OP Expected length of stay: 9-12 Days  Clinical Impression Sw met with patient, introduced self, explained role and addressed questions and concerns. Called pt son via telephone to provide same information. Pt would like information dicussed with him before his children. Patient concerned of worrying them. Pt plans to discharge home alone with intermittent assistance from: children, family and friends. Pt requesting information about a caregiver. Sw will cont to follow up with questions and concerns.   Dyanne Iha 02/05/2021, 1:01 PM

## 2021-02-05 NOTE — Progress Notes (Signed)
Physical Therapy Session Note  Patient Details  Name: Joseph [name] MRN: [id number] Date of Birth: August 26, 1945  Today's Date: 02/05/2021 PT Individual Time: 1435-1515 PT Individual Time Calculation (min): 40 min   Short Term Goals: Week 1:  PT Short Term Goal 1 (Week 1): STG=LTG due to ELOS   Skilled Therapeutic Interventions/Progress Updates:  Pt received sitting in WC and agreeable to PT. Pt transported to entrance of [place] sitting in [place], then performed WC mobility training over cement sidewalk x 171f, 1541fand 20072fith supervision assist overall with min cues for improved symmetry of BLE to prevent veer to the L as well as improve control of the WC on down hill slope. Prolonged rest break between bouts due to fatigue. Pt performed car transfer with mod assist with cues for safety and proper UE placement for squat pivot technique.  Patient returned to room and [facility] and all needs met.      Therapy Documentation Precautions:  Precautions Precautions: Fall Precaution Comments: new L BKA, chronic R BKA with prosthetic, wound vac Restrictions Weight Bearing Restrictions: Yes RLE Weight Bearing: Weight bearing as tolerated LLE Weight Bearing: Non weight bearing   Pain: Pain Assessment Pain Scale: 0-10 Pain Score: 6  Pain Type: Surgical pain Pain Location: Incision Pain Orientation: Left Pain Descriptors / Indicators: Aching;Throbbing Pain Frequency: Constant Pain Onset: On-going Pain Intervention(s): Medication (See eMAR)   Therapy/Group: Individual Therapy  AusLorie Phenix9/2022, 4:08 PM

## 2021-02-05 NOTE — Plan of Care (Signed)
  Problem: RH Balance Goal: LTG Patient will maintain dynamic standing balance (PT) Description: LTG:  Patient will maintain dynamic standing balance with assistance during mobility activities (PT) Flowsheets (Taken 02/05/2021 1600) LTG: Pt will maintain dynamic standing balance during mobility activities with:: Independent with assistive device    Problem: RH Bed Mobility Goal: LTG Patient will perform bed mobility with assist (PT) Description: LTG: Patient will perform bed mobility with assistance, with/without cues (PT). Flowsheets (Taken 02/05/2021 1600) LTG: Pt will perform bed mobility with assistance level of: Independent with assistive device    Problem: RH Car Transfers Goal: LTG Patient will perform car transfers with assist (PT) Description: LTG: Patient will perform car transfers with assistance (PT). Flowsheets (Taken 02/05/2021 1600) LTG: Pt will perform car transfers with assist:: Supervision/Verbal cueing   Problem: RH Furniture Transfers Goal: LTG Patient will perform furniture transfers w/assist (OT/PT) Description: LTG: Patient will perform furniture transfers  with assistance (OT/PT). Flowsheets (Taken 02/05/2021 1600) LTG: Pt will perform furniture transfers with assist:: Independent with assistive device    Problem: RH Ambulation Goal: LTG Patient will ambulate in controlled environment (PT) Description: LTG: Patient will ambulate in a controlled environment, # of feet with assistance (PT). Flowsheets (Taken 02/05/2021 1600) LTG: Pt will ambulate in controlled environ  assist needed:: Supervision/Verbal cueing LTG: Ambulation distance in controlled environment: 5ft with LRAD Goal: LTG Patient will ambulate in home environment (PT) Description: LTG: Patient will ambulate in home environment, # of feet with assistance (PT). Flowsheets (Taken 02/05/2021 1600) LTG: Pt will ambulate in home environ  assist needed:: Supervision/Verbal cueing LTG: Ambulation distance in home  environment: 40ft with LRAD   Problem: RH Wheelchair Mobility Goal: LTG Patient will propel w/c in controlled environment (PT) Description: LTG: Patient will propel wheelchair in controlled environment, # of feet with assist (PT) Flowsheets (Taken 02/05/2021 1600) LTG: Pt will propel w/c in controlled environ  assist needed:: Independent with assistive device LTG: Propel w/c distance in controlled environment: 150ft Goal: LTG Patient will propel w/c in home environment (PT) Description: LTG: Patient will propel wheelchair in home environment, # of feet with assistance (PT). Flowsheets (Taken 02/05/2021 1600) LTG: Pt will propel w/c in home environ  assist needed:: Independent with assistive device LTG: Propel w/c distance in home environment: 81ft Goal: LTG Patient will propel w/c in community environment (PT) Description: LTG: Patient will propel wheelchair in community environment, # of feet with assist (PT) Flowsheets (Taken 02/05/2021 1600) LTG: Pt will propel w/c in community environ  assist needed:: Supervision/Verbal cueing ZYS:[id number] w/c distance in community environment: 226ft

## 2021-02-05 NOTE — Progress Notes (Signed)
PROGRESS NOTE   Subjective/Complaints:  Pt reports he needs to speak with SW. About CAPS and SW about Medicaid.   Still no BM in 6 days now.   ROS:  Pt denies SOB, abd pain, CP, N/V/C/D, and vision changes   Objective:   No results found. Recent Labs    02/04/21 0142 02/05/21 0554  WBC 16.4* 15.3*  HGB 9.3* 9.4*  HCT 28.6* 28.6*  PLT 398 381   Recent Labs    02/04/21 0142 02/05/21 0554  NA 133* 134*  K 4.9 3.9  CL 102 102  CO2 24 25  GLUCOSE 160* 123*  BUN 21 16  CREATININE 1.30* 1.24  CALCIUM 9.1 9.2    Intake/Output Summary (Last 24 hours) at 02/05/2021 0852 Last data filed at 02/05/2021 0446 Gross per 24 hour  Intake 500 ml  Output 1955 ml  Net -1455 ml        Physical Exam: Vital Signs Blood pressure 129/81, pulse 81, temperature 99 F (37.2 C), resp. rate 18, height 6\' 3"  (1.905 m), weight 99.1 kg, SpO2 97 %.    General: awake, alert, appropriate, sitting up in bed; NAD HENT: conjugate gaze; oropharynx moist CV: regular rate; no JVD Pulmonary: CTA B/L; no W/R/R- good air movement GI: soft, NT, ND, (+)BS; hypoactive Psychiatric: appropriate; talkative Neurological: Ox3 Musculoskeletal:     Cervical back: Normal range of motion. No rigidity.     Comments: UE very strong 5/5 in B/L arms RLE- 5/5 in HF/KE/KF- not wearing prosthesis LLE- at least 3/5 in HF- has limb guard and wound VAC in place- no swelling in proximal limb  Skin:    Comments: His right BKA is well-healed.  Left BKA with wound VAC in place appropriately tender. No other skin breakdown assessed L AC fossa IV- look OK  Neurological:     Comments: Patient is alert.  Sitting up in bed.  Oriented x3 and follows commands.   Assessment/Plan: 1. Functional deficits which require 3+ hours per day of interdisciplinary therapy in a comprehensive inpatient rehab setting. Physiatrist is providing close team supervision and 24 hour  management of active medical problems listed below. Physiatrist and rehab team continue to assess barriers to discharge/monitor patient progress toward functional and medical goals  Care Tool:  Bathing              Bathing assist       Upper Body Dressing/Undressing Upper body dressing   What is the patient wearing?: Hospital gown only    Upper body assist Assist Level: Minimal Assistance - Patient > 75%    Lower Body Dressing/Undressing Lower body dressing      What is the patient wearing?: Hospital gown only     Lower body assist Assist for lower body dressing: Minimal Assistance - Patient > 75%     Toileting Toileting    Toileting assist Assist for toileting: Moderate Assistance - Patient 50 - 74%     Transfers Chair/bed transfer  Transfers assist           Locomotion Ambulation   Ambulation assist              Walk 10  feet activity   Assist           Walk 50 feet activity   Assist           Walk 150 feet activity   Assist           Walk 10 feet on uneven surface  activity   Assist           Wheelchair     Assist               Wheelchair 50 feet with 2 turns activity    Assist            Wheelchair 150 feet activity     Assist          Blood pressure 129/81, pulse 81, temperature 99 F (37.2 C), resp. rate 18, height 6\' 3"  (1.905 m), weight 99.1 kg, SpO2 97 %.    Medical Problem List and Plan: 1.  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  Continue wound VAC as directed             -patient may not shower until VAC removed- will be removed 9/14             -ELOS/Goals: 9-12 days- mod I to supervision  -cont' PT and OT- first day fo evaluations today 2.  Antithrombotics: -DVT/anticoagulation:  Pharmaceutical: Heparin             -antiplatelet therapy: Aspirin 81  mg daily 3. Pain Management: Oxycodone as needed 4.  Mood: Provide emotional support             -antipsychotic agents: N/A 5. Neuropsych: This patient is capable of making decisions on his own behalf. 6. Skin/Wound Care: Routine skin checks 7. Fluids/Electrolytes/Nutrition: Routine in and outs with follow-up chemistries 8.  Acute blood loss anemia.  Follow-up CBC.  Continue iron supplement 9.  Diabetes mellitus with peripheral neuropathy.  Hemoglobin A1c 9.4.  SSI. Basaglar 15 unit daily  9/9- BG's well cotnrolled- con't regimen 10.  Hypertension.  Norvasc 10 mg daily.  Monitor with increased mobility 11.  Hyperlipidemia.  Lipitor 12.  CKD stage II.  Creatinine baseline 1.26-1.52.  Follow-up chemistries 13.  Diastolic congestive heart failure.  Monitor for any signs of fluid overload 14. Constipation- LBM 5 days ago- if doesn't go with regular bowel meds by tomorrow, will give Sorbitol.   9/9- ordered Sorbitol at 5pm today.       LOS: 1 days A FACE TO FACE EVALUATION WAS PERFORMED  Krisandra Bueno 02/05/2021, 8:52 AM

## 2021-02-05 NOTE — Evaluation (Signed)
Physical Therapy Assessment and Plan  Patient Details  Name: Joseph [name] MRN: [id number] Date of Birth: July 23, 1945  PT Diagnosis: Difficulty walking, Muscle weakness, and Pain in Residual limb Rehab Potential: Good ELOS: 7-10 days   Today's Date: 02/05/2021 PT Individual Time: 3154-0086 PT Individual Time Calculation (min): 6 min    Hospital Problem: Principal Problem:   S/P bilateral below knee amputation ([place])   Past Medical History:  Past Medical History:  Diagnosis Date   Cellulitis of right lower extremity    Diabetes mellitus type 2 in nonobese ([place])    Gangrene of right foot ([place])    Hypertension    Open wound of right foot    Past Surgical History:  Past Surgical History:  Procedure Laterality Date   ABDOMINAL AORTOGRAM W/LOWER EXTREMITY Left 01/27/2021   Procedure: ABDOMINAL AORTOGRAM W/LOWER EXTREMITY;  Surgeon: Cherre Robins, MD;  Location: [facility];  Service: Cardiovascular;  Laterality: Left;   AMPUTATION Right 11/16/2018   Procedure: RIGHT BELOW KNEE AMPUTATION;  Surgeon: Newt Minion, MD;  Location: [place];  Service: Orthopedics;  Laterality: Right;   AMPUTATION Left 02/03/2021   Procedure: LEFT BELOW KNEE AMPUTATION;  Surgeon: Newt Minion, MD;  Location: [facility];  Service: Orthopedics;  Laterality: Left;   APPLICATION OF WOUND VAC  02/03/2021   Procedure: APPLICATION OF WOUND VAC;  Surgeon: Newt Minion, MD;  Location: [facility];  Service: Orthopedics;;   HAND SURGERY      Assessment & Plan Clinical Impression: Patient is a 75 year old right-handed male with history of diabetes mellitus, diastolic congestive heart failure, CKD stage II, 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  Per chart review patient lives alone.  Two-level home ramped entrance.  Independent with assistive device using prosthesis.  He reports he has someone moving in with him to help assist.  Presented 01/30/2021 with osteomyelitis of the left great  toe followed by Dr. Sharol Given with essentially no circulation from the ankle distally.  His dorsalis pedis ABI is 0.2 posterior tibial ABI 0.  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 Sharol Given.  Wound VAC applied as directed.  Hospital course acute blood loss anemia 9.3, leukocytosis 16,400 felt to be reactive after surgery.  Noted history of CKD stage II creatinine 1.30.  Subcutaneous heparin for DVT prophylaxis.  Therapy evaluations completed due to patient decreased functional mobility was admitted for a comprehensive rehab program. Patient transferred to CIR on 02/04/2021 .   Patient currently requires min with mobility secondary to muscle weakness and muscle joint tightness, decreased cardiorespiratoy endurance, and decreased standing balance, decreased balance strategies, and difficulty maintaining precautions.  Prior to hospitalization, patient was modified independent  with mobility and lived with Alone in a House home.  Home access is  Ramped entrance.  Patient will benefit from skilled PT intervention to maximize safe functional mobility and minimize fall risk for planned discharge home with intermittent assist.  Anticipate patient will benefit from follow up [facility] at discharge.  PT - End of Session Activity Tolerance: Tolerates 10 - 20 min activity with multiple rests Endurance Deficit: Yes PT Assessment Rehab Potential (ACUTE/IP ONLY): Good PT Barriers to Discharge: Decreased caregiver support;Weight bearing restrictions PT Patient demonstrates impairments in the following area(s): Balance;Edema;Endurance;Pain;Safety;Sensory;Skin Integrity PT Transfers Functional Problem(s): Bed Mobility;Bed to Chair;Car;Furniture;Floor PT Locomotion Functional Problem(s): Ambulation;Wheelchair Mobility;Stairs PT Plan PT Intensity: Minimum of 1-2 x/day ,45 to 90 minutes PT Frequency: 5 out  of 7 days PT Duration Estimated Length of Stay: 7-10 days PT  Treatment/Interventions: Ambulation/gait training;Balance/vestibular training;Community reintegration;Discharge planning;DME/adaptive equipment instruction;Functional mobility training;Disease management/prevention;Neuromuscular re-education;Psychosocial support;Stair training;UE/LE Strength taining/ROM;Wheelchair propulsion/positioning;Pain management;Skin care/wound management;Therapeutic Activities;UE/LE Coordination activities;Patient/family education;Splinting/orthotics;Therapeutic Exercise;Visual/perceptual remediation/compensation PT Transfers Anticipated Outcome(s): Mod I with LRAD PT Locomotion Anticipated Outcome(s): Mod I WC mobility. supervision assist ambulatoion PT Recommendation Recommendations for Other Services: Therapeutic Recreation consult Therapeutic Recreation Interventions: Outing/community reintergration;Stress management Follow Up Recommendations: Home health PT Patient destination: Home Equipment Recommended: To be determined Equipment Details: Pt has WC and RW.   PT Evaluation Precautions/Restrictions Precautions Precautions: Fall Precaution Comments: new L BKA, chronic R BKA with prosthetic, wound vac Restrictions Weight Bearing Restrictions: Yes RLE Weight Bearing: Weight bearing as tolerated LLE Weight Bearing: Non weight bearing  Pain Pain Assessment Pain Scale: 0-10 Pain Score: 6  Pain Type: Surgical pain Pain Location: Incision Pain Orientation: Left Pain Descriptors / Indicators: Aching;Throbbing Pain Frequency: Constant Pain Onset: On-going Pain Intervention(s): Medication (See eMAR) Pain Interference Pain Interference Pain Effect on Sleep: 1. Rarely or not at all Pain Interference with Therapy Activities: 2. Occasionally Pain Interference with Day-to-Day Activities: 2. Occasionally Home Living/Prior Functioning Home Living Available Help at Discharge: Family;Friend(s);Available PRN/intermittently;Other (Comment) Type of Home: House Home  Access: Ramped entrance Home Layout: One level Bathroom Shower/Tub: Chiropodist: Standard  Lives With: Alone Prior Function Level of Independence: Independent with basic ADLs;Independent with homemaking with ambulation;Independent with homemaking with wheelchair;Independent with gait;Independent with transfers;Requires assistive device for independence (prosthetic)  Able to Take Stairs?: Yes Driving: Yes Vocation: Retired Comments: Film/video editor, active Vision/Perception  Vision - History Ability to See in Adequate Light: 0 Adequate Perception Perception: Within Functional Limits Praxis Praxis: Intact  Cognition Overall Cognitive Status: Within Functional Limits for tasks assessed Arousal/Alertness: Awake/alert Orientation Level: Oriented X4 Year: 2022 Month: September Day of Week: Correct Memory: Appears intact Immediate Memory Recall: Sock;Blue;Bed Memory Recall Sock: Without Cue Memory Recall Blue: Without Cue Memory Recall Bed: Without Cue Awareness: Appears intact Problem Solving: Appears intact Safety/Judgment: Appears intact Sensation Sensation Light Touch: Impaired by gross assessment Hot/Cold: Appears Intact Proprioception: Appears Intact Stereognosis: Appears Intact Additional Comments: pt reports a different "sensation in L leg" with mild decrease light touch in residual limb Coordination Gross Motor Movements are Fluid and Coordinated: Yes Fine Motor Movements are Fluid and Coordinated: Yes Finger Nose Finger Test: [facility] Motor  Motor Motor: Other (comment) Motor - Skilled Clinical Observations: generalized weakness   Trunk/Postural Assessment  Cervical Assessment Cervical Assessment: Within Functional Limits Thoracic Assessment Thoracic Assessment: Within Functional Limits Lumbar Assessment Lumbar Assessment: Within Functional Limits Postural Control Postural Control: Within Functional Limits  Balance Balance Balance Assessed:  Yes Static Sitting Balance Static Sitting - Balance Support: No upper extremity supported Static Sitting - Level of Assistance: 6: Modified independent (Device/Increase time) Dynamic Sitting Balance Dynamic Sitting - Balance Support: No upper extremity supported Dynamic Sitting - Level of Assistance: 5: Stand by assistance Static Standing Balance Static Standing - Balance Support: Bilateral upper extremity supported Static Standing - Level of Assistance: 5: Stand by assistance;4: Min assist Dynamic Standing Balance Dynamic Standing - Balance Support: Bilateral upper extremity supported;During functional activity Extremity Assessment      RLE Assessment RLE Assessment: Exceptions to [facility] General Strength Comments: hx of R BKA in 2020. hip/knee grossly 4+/5 to 5/5 LLE Assessment Active Range of Motion (AROM) Comments: grossly WFL for knee extension. 95Deg flexion General Strength Comments: hip 4/5. knee flexion/extension 3/5 with mild pain at end  range.  Care Tool Care Tool Bed Mobility Roll left and right activity   Roll left and right assist level: Supervision/Verbal cueing    Sit to lying activity   Sit to lying assist level: Supervision/Verbal cueing    Lying to sitting on side of bed activity   Lying to sitting on side of bed assist level: the ability to move from lying on the back to sitting on the side of the bed with no back support.: Supervision/Verbal cueing     Care Tool Transfers Sit to stand transfer   Sit to stand assist level: Moderate Assistance - Patient 50 - 74%    Chair/bed transfer   Chair/bed transfer assist level: Minimal Assistance - Patient > 75%     Physiological scientist transfer assist level: Moderate Assistance - Patient 50 - 74%      Care Tool Locomotion Ambulation   Assist level: Minimal Assistance - Patient > 75% Assistive device: Walker-rolling Max distance: 20  Walk 10 feet activity   Assist level: Minimal  Assistance - Patient > 75% Assistive device: Walker-rolling   Walk 50 feet with 2 turns activity Walk 50 feet with 2 turns activity did not occur: Safety/medical concerns      Walk 150 feet activity Walk 150 feet activity did not occur: Safety/medical concerns      Walk 10 feet on uneven surfaces activity Walk 10 feet on uneven surfaces activity did not occur: Safety/medical concerns      Stairs Stair activity did not occur: Safety/medical concerns        Walk up/down 1 step activity Walk up/down 1 step or curb (drop down) activity did not occur: Safety/medical concerns     Walk up/down 4 steps activity did not occuR: Safety/medical concerns  Walk up/down 4 steps activity      Walk up/down 12 steps activity Walk up/down 12 steps activity did not occur: Safety/medical concerns      Pick up small objects from floor Pick up small object from the floor (from standing position) activity did not occur: Safety/medical concerns      Wheelchair   Type of Wheelchair: Manual   Wheelchair assist level: Supervision/Verbal cueing    Wheel 50 feet with 2 turns activity   Assist Level: Supervision/Verbal cueing  Wheel 150 feet activity   Assist Level: Supervision/Verbal cueing    Refer to Care Plan for Long Term Goals  SHORT TERM GOAL WEEK 1 PT Short Term Goal 1 (Week 1): STG=LTG due to ELOS  Recommendations for other services: Neuropsych and Therapeutic Recreation  Stress management and Outing/community reintegration  Skilled Therapeutic Intervention  Pt received sitting  EOB and agreeable to PT. PT instructed patient in PT Evaluation and initiated treatment intervention; see above for results. PT educated patient in [place], rehab potential, rehab goals, and discharge recommendations along with recommendation for follow-up rehabilitation services. Pt transported to rehab gym in [facility]. Sit<>stand with mod assist from [facility] with cues for improved placement of the RLE allowing min assist on 3rd  and 4th attempt. Bed mobility as listed below and supine to prone with supervision assist with cues for management of the LUE with roll to thte L. Sustained hip flexor stretch x 2 min in prone. Supine therex: SAQ, SLR, bridge through bolster, hip extension in sidelying. All completed x 12-15 BLE. Squat pivot transfer with min assist back to [facility] with cues for UE placement for safety. Patient returned  to room and left sitting in [facility] with call bell in reach and all needs met.       Mobility Bed Mobility Bed Mobility: Rolling Right;Rolling Left;Supine to Sit;Sit to Supine Rolling Right: Supervision/verbal cueing Rolling Left: Supervision/Verbal cueing Supine to Sit: Supervision/Verbal cueing Sit to Supine: Supervision/Verbal cueing Transfers Transfers: Sit to Stand;Stand to Sit;Stand Pivot Transfers;Squat Pivot Transfers Sit to Stand: Moderate Assistance - Patient 50-74%;Minimal Assistance - Patient > 75% Stand to Sit: Minimal Assistance - Patient > 75% Stand Pivot Transfers: Moderate Assistance - Patient 50 - 74% Squat Pivot Transfers: Minimal Assistance - Patient > 75% Transfer (Assistive device): Other (Comment) (SB) Locomotion  Gait Ambulation: Yes Gait Assistance: Minimal Assistance - Patient > 75% Gait Distance (Feet): 20 Feet Assistive device: Rolling walker Gait Gait: Yes Gait Pattern: Impaired Gait Pattern: Trunk flexed;Step-to pattern Gait velocity: decreased Stairs / Additional Locomotion Stairs: No Wheelchair Mobility Wheelchair Mobility: Yes Wheelchair Assistance: Chartered loss adjuster: Both upper extremities Wheelchair Parts Management: Needs assistance;Supervision/cueing Distance: 150   Discharge Criteria: Patient will be discharged from PT if patient refuses treatment 3 consecutive times without medical reason, if treatment goals not met, if there is a change in medical status, if patient makes no progress towards goals or if patient is  discharged from hospital.  The above assessment, treatment plan, treatment alternatives and goals were discussed and mutually agreed upon: by patient  [name] 02/05/2021, 12:02 PM

## 2021-02-05 NOTE — Evaluation (Signed)
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 ([place])    Gangrene of right foot ([place])    Hypertension    Open wound of right foot    Past Surgical History:  Past Surgical History:  Procedure Laterality Date   ABDOMINAL AORTOGRAM W/LOWER EXTREMITY Left 01/27/2021   Procedure: ABDOMINAL AORTOGRAM W/LOWER EXTREMITY;  Surgeon: Cherre Robins, MD;  Location: [facility];  Service: Cardiovascular;  Laterality: Left;   AMPUTATION Right 11/16/2018   Procedure: RIGHT BELOW KNEE AMPUTATION;  Surgeon: Newt Minion, MD;  Location: [place];  Service: Orthopedics;  Laterality: Right;   AMPUTATION Left 02/03/2021   Procedure: LEFT BELOW KNEE AMPUTATION;  Surgeon: Newt Minion, MD;  Location: [facility];  Service: Orthopedics;  Laterality: Left;   APPLICATION OF WOUND VAC  02/03/2021   Procedure: APPLICATION OF WOUND VAC;  Surgeon: Newt Minion, MD;  Location: [place];  Service: Orthopedics;;   HAND SURGERY      Assessment & Plan Clinical Impression: . Joseph [name] is a 75 year old right-handed male with history of diabetes mellitus, diastolic congestive heart failure, CKD stage II, 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  Per chart review patient lives alone.  Two-level home ramped entrance.  Independent with assistive device using prosthesis.  He reports he has someone moving in with him to help assist.  Presented 01/30/2021 with  osteomyelitis of the left great toe followed by Dr. Sharol Given with essentially no circulation from the ankle distally.  His dorsalis pedis ABI is 0.2 posterior tibial ABI 0.  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 Sharol Given.  Wound VAC applied as directed.  Hospital course acute blood loss anemia 9.3, leukocytosis 16,400 felt to be reactive after surgery.  Noted history of CKD stage II creatinine 1.30.  Subcutaneous heparin for DVT prophylaxis.  Therapy evaluations completed due to patient decreased functional mobility was admitted for a comprehensive rehab program.  Patient transferred to CIR on 02/04/2021 .    Patient currently requires min with basic self-care skills secondary to muscle weakness and decreased standing balance and decreased balance strategies.  Prior to hospitalization, patient could complete ADLs  with modified independent .  Patient will benefit from skilled intervention to increase independence with basic self-care skills and increase level of independence with iADL prior to discharge home independently.  Anticipate patient will require intermittent supervision and follow up home health.  OT - End of Session Endurance Deficit: Yes OT Assessment Rehab Potential (ACUTE ONLY): Excellent OT Patient demonstrates impairments in the following area(s): Balance;Pain;Endurance;Motor OT Basic ADL's Functional Problem(s): Bathing;Dressing;Toileting OT Advanced ADL's Functional Problem(s): Simple Meal Preparation;Light Housekeeping;Laundry OT Transfers Functional Problem(s): Toilet;Tub/Shower OT Additional Impairment(s): None OT Plan OT Intensity: Minimum of 1-2 x/day, 45 to 90 minutes OT Frequency: 5 out of 7 days OT Duration/Estimated Length of Stay: 7-9 days OT Treatment/Interventions: Balance/vestibular training;Discharge planning;Pain management;Functional mobility training;DME/adaptive equipment instruction;Patient/family education;Self  Care/advanced  ADL retraining;Psychosocial support;Therapeutic Activities;Therapeutic Exercise;UE/LE Strength taining/ROM;UE/LE Coordination activities OT Self Feeding Anticipated Outcome(s): no goal, pt is independent OT Basic Self-Care Anticipated Outcome(s): Mod I OT Toileting Anticipated Outcome(s): Mod I OT Bathroom Transfers Anticipated Outcome(s): Mod I OT Recommendation Patient destination: Home Follow Up Recommendations: Home health OT Equipment Recommended: None recommended by OT   OT Evaluation Precautions/Restrictions  Precautions Precautions: Fall Precaution Comments: new L BKA, chronic R BKA with prosthetic, wound vac Restrictions RLE Weight Bearing: Weight bearing as tolerated LLE Weight Bearing: Non weight bearing   Pain Pain Assessment Pain Scale: 0-10 Pain Score: 8  Pain Type: Surgical pain Pain Location: Incision Pain Orientation: Left Pain Descriptors / Indicators: Aching;Throbbing Pain Frequency: Constant Pain Onset: On-going Pain Intervention(s): Medication (See eMAR) Home Living/Prior [place] expects to be discharged to:: Private residence Living Arrangements: Alone Available Help at Discharge: Family, Friend(s), Available PRN/intermittently, Other (Comment) Type of Home: House Home Access: Ramped entrance Home Layout: One level Bathroom Shower/Tub: Chiropodist: Standard  Lives With: Alone Prior Function Level of Independence: Independent with basic ADLs, Independent with homemaking with ambulation, Independent with homemaking with wheelchair, Independent with gait, Independent with transfers, Requires assistive device for independence (prosthetic)  Able to Take Stairs?: Yes Driving: Yes Vocation: Retired Comments: Film/video editor, active Vision Baseline Vision/History: 1 Wears glasses Ability to See in Adequate Light: 0 Adequate Patient Visual Report: No change from baseline Vision Assessment?:  No apparent visual deficits Perception  Perception: Within Functional Limits Praxis Praxis: Intact Cognition Overall Cognitive Status: Within Functional Limits for tasks assessed Arousal/Alertness: Awake/alert Orientation Level: Person;Place;Situation Person: Oriented Place: Oriented Situation: Oriented Year: 2022 Month: September Day of Week: Correct Memory: Appears intact Immediate Memory Recall: Sock;Blue;Bed Memory Recall Sock: Without Cue Memory Recall Blue: Without Cue Memory Recall Bed: Without Cue Awareness: Appears intact Problem Solving: Appears intact Safety/Judgment: Appears intact Sensation Sensation Light Touch: Impaired by gross assessment Hot/Cold: Appears Intact Proprioception: Appears Intact Stereognosis: Appears Intact Additional Comments: pt reports a different "sensation in L leg" with mild decrease light touch in residual limb Coordination Gross Motor Movements are Fluid and Coordinated: Yes Fine Motor Movements are Fluid and Coordinated: Yes Finger Nose Finger Test: [facility] Motor  Motor Motor: Other (comment) Motor - Skilled Clinical Observations: generalized weakness  Trunk/Postural Assessment  Cervical Assessment Cervical Assessment: Within Functional Limits Thoracic Assessment Thoracic Assessment: Within Functional Limits Lumbar Assessment Lumbar Assessment: Within Functional Limits Postural Control Postural Control: Within Functional Limits  Balance Balance Balance Assessed: Yes Static Sitting Balance Static Sitting - Balance Support: No upper extremity supported Static Sitting - Level of Assistance: 6: Modified independent (Device/Increase time) Dynamic Sitting Balance Dynamic Sitting - Balance Support: No upper extremity supported Dynamic Sitting - Level of Assistance: 5: Stand by assistance Static Standing Balance Static Standing - Balance Support: Bilateral upper extremity supported Static Standing - Level of Assistance: 5: Stand by  assistance;4: Min assist Dynamic Standing Balance Dynamic Standing - Balance Support: Bilateral upper extremity supported;During functional activity Extremity/Trunk Assessment RUE Assessment RUE Assessment: Within Functional Limits LUE Assessment LUE Assessment: Within Functional Limits  Care Tool Care Tool Self Care Eating   Eating Assist Level: Independent    Oral Care    Oral Care Assist Level: Set up assist    Bathing   Body parts bathed by patient: Right arm;Left arm;Chest;Abdomen;Front perineal area;Buttocks;Left upper leg;Right upper leg;Face   Body parts n/a: Right lower leg;Left lower leg Assist Level: Supervision/Verbal cueing    Upper Body Dressing(including orthotics)  What is the patient wearing?: Pull over shirt   Assist Level: Set up assist    Lower Body Dressing (excluding footwear)   What is the patient wearing?: Underwear/pull up;Pants Assist for lower body dressing: Supervision/Verbal cueing    Putting on/Taking off footwear   What is the patient wearing?: Orthosis Assist for footwear: Supervision/Verbal cueing       Care Tool Toileting Toileting activity   Assist for toileting: Moderate Assistance - Patient 50 - 74%     Care Tool Bed Mobility Roll left and right activity   Roll left and right assist level: Independent    Sit to lying activity   Sit to lying assist level: Supervision/Verbal cueing    Lying to sitting on side of bed activity   Lying to sitting on side of bed assist level: the ability to move from lying on the back to sitting on the side of the bed with no back support.: Supervision/Verbal cueing     Care Tool Transfers Sit to stand transfer   Sit to stand assist level: Moderate Assistance - Patient 50 - 74%    Chair/bed transfer   Chair/bed transfer assist level: Minimal Assistance - Patient > 75%     Toilet transfer   Assist Level: Minimal Assistance - Patient > 75% (squat pivot)     Care Tool Cognition  Expression  of Ideas and Wants Expression of Ideas and Wants: 4. Without difficulty (complex and basic) - expresses complex messages without difficulty and with speech that is clear and easy to understand  Understanding Verbal and Non-Verbal Content Understanding Verbal and Non-Verbal Content: 4. Understands (complex and basic) - clear comprehension without cues or repetitions   Memory/Recall Ability Memory/Recall Ability : Current season;That he or she is in a hospital/hospital unit;Staff names and faces;Location of own room   Refer to Care Plan for Long Term Goals  SHORT TERM GOAL WEEK 1 OT Short Term Goal 1 (Week 1): Pt will transfer to [facility] over toilet with RW with S. OT Short Term Goal 2 (Week 1): Pt will complete toileting with S.  Recommendations for other services: None    Skilled Therapeutic Intervention ADL ADL Eating: Independent Grooming: Independent Where Assessed-Grooming: Sitting at sink Upper Body Bathing: Setup Where Assessed-Upper Body Bathing: Edge of bed Lower Body Bathing: Supervision/safety Where Assessed-Lower Body Bathing: Edge of bed Upper Body Dressing: Setup Where Assessed-Upper Body Dressing: Edge of bed Lower Body Dressing: Supervision/safety Where Assessed-Lower Body Dressing: Edge of bed Toileting: Minimal assistance Where Assessed-Toileting: Bedside Commode Toilet Transfer: Minimal assistance Toilet Transfer Method: Squat pivot Toilet Transfer Equipment: Drop arm bedside commode Mobility  Bed Mobility Bed Mobility: Rolling Right;Rolling Left;Supine to Sit;Sit to Supine Rolling Right: Supervision/verbal cueing Rolling Left: Supervision/Verbal cueing Supine to Sit: Supervision/Verbal cueing Sit to Supine: Supervision/Verbal cueing Transfers Sit to Stand: Moderate Assistance - Patient 50-74%;Minimal Assistance - Patient > 75% Stand to Sit: Minimal Assistance - Patient > 75%  Pt seen for initial evaluation and ADL training with a focus on balance and  mobility. Pt has taken excellent care of himself since his last BKA 2 years ago and has been continuing his HEP.  So with this new BKA, he has already learned to adapt well. To be able to be as independent as possible pt just needs to learn a new way of balancing and getting stronger to power up with his R leg.  Reviewed role of OT and discussed pt's goals. He is highly motivated and should do very well  in therapy.  Pt resting in bed with alarm on for next session.  Discharge Criteria: Patient will be discharged from OT if patient refuses treatment 3 consecutive times without medical reason, if treatment goals not met, if there is a change in medical status, if patient makes no progress towards goals or if patient is discharged from hospital.  The above assessment, treatment plan, treatment alternatives and goals were discussed and mutually agreed upon: by patient  [facility] 02/05/2021, 12:28 PM

## 2021-02-06 DIAGNOSIS — I739 Peripheral vascular disease, unspecified: Secondary | ICD-10-CM

## 2021-02-06 DIAGNOSIS — E669 Obesity, unspecified: Secondary | ICD-10-CM

## 2021-02-06 DIAGNOSIS — K5901 Slow transit constipation: Secondary | ICD-10-CM

## 2021-02-06 DIAGNOSIS — E1169 Type 2 diabetes mellitus with other specified complication: Secondary | ICD-10-CM

## 2021-02-06 LAB — GLUCOSE, CAPILLARY
Glucose-Capillary: 145 mg/dL — ABNORMAL HIGH (ref 70–99)
Glucose-Capillary: 142 mg/dL — ABNORMAL HIGH (ref 70–99)
Glucose-Capillary: 162 mg/dL — ABNORMAL HIGH (ref 70–99)
Glucose-Capillary: 99 mg/dL (ref 70–99)

## 2021-02-06 MED ORDER — SORBITOL 70 % SOLN
60.0000 mL | Status: AC
  Administered 2021-02-06: 60 mL via ORAL
  Filled 2021-02-06: qty 60

## 2021-02-06 NOTE — Progress Notes (Signed)
PROGRESS NOTE   Subjective/Complaints: Overall doing well. Reports constipation. Appetite good. Pain controlled  ROS: Patient denies fever, rash, sore throat, blurred vision, nausea, vomiting, diarrhea, cough, shortness of breath or chest pain, joint or back pain, headache, or mood change.    Objective:   No results found. Recent Labs    02/04/21 0142 02/05/21 0554  WBC 16.4* 15.3*  HGB 9.3* 9.4*  HCT 28.6* 28.6*  PLT 398 381   Recent Labs    02/04/21 0142 02/05/21 0554  NA 133* 134*  K 4.9 3.9  CL 102 102  CO2 24 25  GLUCOSE 160* 123*  BUN 21 16  CREATININE 1.30* 1.24  CALCIUM 9.1 9.2    Intake/Output Summary (Last 24 hours) at 02/06/2021 1045 Last data filed at 02/06/2021 0804 Gross per 24 hour  Intake 477 ml  Output 1775 ml  Net -1298 ml        Physical Exam: Vital Signs Blood pressure 135/73, pulse 80, temperature 98.7 F (37.1 C), temperature source Oral, resp. rate 18, height 6\' 3"  (1.905 m), weight 99.1 kg, SpO2 96 %.    Constitutional: No distress . Vital signs reviewed. HEENT: NCAT, EOMI, oral membranes moist Neck: supple Cardiovascular: RRR without murmur. No JVD    Respiratory/Chest: CTA Bilaterally without wheezes or rales. Normal effort    GI/Abdomen: BS +, non-tender, non-distended Ext: no clubbing, cyanosis, or edema Psych: pleasant and cooperative  Musculoskeletal:     Cervical back: Normal range of motion. No rigidity.     Comments: UE very strong 5/5 in B/L arms RLE- 5/5 in HF/KE/KF- not wearing prosthesis LLE- at least 3/5 in HF- has limb guard and wound VAC in place- no swelling in proximal limb  Skin:    Comments: His right BKA is well-healed.  Left BKA with wound VAC still in place. No other skin breakdown assessed L AC fossa IV- look OK  Neurological:     Comments: Patient is alert.  Sitting up in bed.  Oriented x3 and follows commands.   Assessment/Plan: 1.  Functional deficits which require 3+ hours per day of interdisciplinary therapy in a comprehensive inpatient rehab setting. Physiatrist is providing close team supervision and 24 hour management of active medical problems listed below. Physiatrist and rehab team continue to assess barriers to discharge/monitor patient progress toward functional and medical goals  Care Tool:  Bathing    Body parts bathed by patient: Right arm, Left arm     Body parts n/a: Right lower leg   Bathing assist Assist Level: Supervision/Verbal cueing     Upper Body Dressing/Undressing Upper body dressing   What is the patient wearing?: Pull over shirt    Upper body assist Assist Level: Set up assist    Lower Body Dressing/Undressing Lower body dressing      What is the patient wearing?: Underwear/pull up     Lower body assist Assist for lower body dressing: Supervision/Verbal cueing     Toileting Toileting    Toileting assist Assist for toileting: Moderate Assistance - Patient 50 - 74%     Transfers Chair/bed transfer  Transfers assist     Chair/bed transfer assist level: Minimal Assistance -  Patient > 75%     Locomotion Ambulation   Ambulation assist      Assist level: Minimal Assistance - Patient > 75% Assistive device: Walker-rolling Max distance: 20   Walk 10 feet activity   Assist     Assist level: Minimal Assistance - Patient > 75% Assistive device: Walker-rolling   Walk 50 feet activity   Assist Walk 50 feet with 2 turns activity did not occur: Safety/medical concerns         Walk 150 feet activity   Assist Walk 150 feet activity did not occur: Safety/medical concerns         Walk 10 feet on uneven surface  activity   Assist Walk 10 feet on uneven surfaces activity did not occur: Safety/medical concerns         Wheelchair     Assist Is the patient using a wheelchair?: Yes Type of Wheelchair: Manual    Wheelchair assist level:  Supervision/Verbal cueing      Wheelchair 50 feet with 2 turns activity    Assist        Assist Level: Supervision/Verbal cueing   Wheelchair 150 feet activity     Assist      Assist Level: Supervision/Verbal cueing   Blood pressure 135/73, pulse 80, temperature 98.7 F (37.1 C), temperature source Oral, resp. rate 18, height 6\' 3"  (1.905 m), weight 99.1 kg, SpO2 96 %.    Medical Problem List and Plan: 1.  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  Continue wound VAC as directed             -patient may not shower until VAC removed- will be removed 9/14             -ELOS/Goals: 9-12 days- mod I to supervision  -Continue CIR therapies including PT, OT  2.  Antithrombotics: -DVT/anticoagulation:  Pharmaceutical: Heparin             -antiplatelet therapy: Aspirin 81  mg daily 3. Pain Management: Oxycodone as needed 4. Mood: Provide emotional support             -antipsychotic agents: N/A 5. Neuropsych: This patient is capable of making decisions on his own behalf. 6. Skin/Wound Care: Routine skin checks 7. Fluids/Electrolytes/Nutrition: Routine in and outs with follow-up chemistries 8.  Acute blood loss anemia.  Follow-up CBC.  Continue iron supplement 9.  Diabetes mellitus with peripheral neuropathy.  Hemoglobin A1c 9.4.  SSI. Basaglar 15 unit daily  CBG (last 3)  Recent Labs    02/05/21 1631 02/05/21 2108 02/06/21 0601  GLUCAP 130* 143* 145*    9/9-10- BG's well cotnrolled- con't regimen 10.  Hypertension.  Norvasc 10 mg daily.  Monitor with increased mobility 11.  Hyperlipidemia.  Lipitor 12.  CKD stage II.  Creatinine baseline 1.26-1.52.  Follow-up chemistries 13.  Diastolic congestive heart failure.  Monitor for any signs of fluid overload 14. Constipation-still no bm -60cc sorbitol followed by SSE today if needed -pt is eating fruit, encouraged fluids too       LOS: 2 days A FACE TO FACE  EVALUATION WAS PERFORMED  05-06-1987 02/06/2021, 10:45 AM

## 2021-02-06 NOTE — Progress Notes (Signed)
Occupational Therapy Session Note  Patient Details  Name: [name] Mckellar MRN: [id number] Date of Birth: [birth year]  Today's Date: 02/06/2021 Session 1 OT Individual Time: 5170-0174 OT Individual Time Calculation (min): 58 min   Session 2 OT Individual Time: 1130-1200 OT Individual Time Calculation (min): 30 min   Short Term Goals: Week 1:  OT Short Term Goal 1 (Week 1): Pt will transfer to [facility] over toilet with RW with S. OT Short Term Goal 2 (Week 1): Pt will complete toileting with S.  Skilled Therapeutic Interventions/Progress Updates:    Session 1 Pt greeted semi-reclined in bed and agreeable to OT treatment session focused on self-care retraining. Pt came to sitting EOB with supervision. Bathing/dressing completed from EOB using lateral leans to wash buttocks and pull pants up over hips. OT assist to get pants on over wound vac. Pt donned R prosthesis with set-up A, then complete lateral scoot transfer to wc with min A. Grooming tasks from wc at the sink with set-up A. Worked on wc propulsion to therapy gym. UB there-ex using 4 lb dowel rod and beach ball volley. Pt propelle\d wc back to room and left seated in wc with alarm belt on, call bell in reach, and needs met.   Session 2 Pt greeted seated in wc and agreeable to OT treatment session. Pt propelled wc to therapy gym. Pt reported just finishing PT session. UB strength/endurance with 5 minutes forward and 5 back on [employer] Arm bike. Discussed PLOF and goals for OT while in rehab. Pt returned to the room and set-up for lunch.    Therapy Documentation Precautions:  Precautions Precautions: Fall Precaution Comments: new L BKA, chronic R BKA with prosthetic, wound vac Restrictions Weight Bearing Restrictions: Yes RLE Weight Bearing: Weight bearing as tolerated LLE Weight Bearing: Non weight bearing Pain: Pain Assessment Pain Scale: 0-10 Pain Score: 6 Pain Type: Surgical pain Pain Location: Leg Pain Orientation: Left Pain  Descriptors / Indicators: Aching Pain Frequency: Constant Pain Onset: On-going Patients Stated Pain Goal: 0 Pain Intervention(s): Repositioned    Therapy/Group: Individual Therapy  Valma Cava 02/06/2021, 3:42 PM

## 2021-02-06 NOTE — Progress Notes (Signed)
Patient refused the enema because he had BM

## 2021-02-06 NOTE — Progress Notes (Signed)
Physical Therapy Session Note  Patient Details  Name: Joseph [name] MRN: [id number] Date of Birth: Nov 20, 1945  Today's Date: 02/06/2021 PT Individual Time: 9476-5465; 0354-6568 PT Individual Time Calculation (min): 61 min and 41 min  Short Term Goals: Week 1:  PT Short Term Goal 1 (Week 1): STG=LTG due to ELOS  Skilled Therapeutic Interventions/Progress Updates:  Session 1 Pt received sitting in WC in room, L limb guard donned and denied pain. Emphasis of session on improved endurance, L ROM and R weightbearing tolerance. Pt self-propelled in WC >150' w/supervision.   In // bars for R weightbearing tolerance and L AROM:  -Sit <>stands x5 w/CGA, verbal cues for hand placement -Hip 3 way (flex/ext/abd) on LLE, 2x15 per side w/BUE support and CGA, multimodal cues for proper form  -Amb. 10' w/2 turns w/CGA x2, noted significant reliance on BUEs to the point where RLE was barely on ground.   Pt self-propelled w/supervision 75'. UE bike x4 mins for improved shoulder ROM and cardiovascular conditioning, 2 min fwd and 2 min retro. Pt performed lateral scoot transfer from WC to mat on R side w/ supervision. Russian twists w/2KG weighted ball, 2 sets of , for improved core stability. Lateral scoot transfer from mat to [facility] on L side w/supervision and pt self-propelled >200' w/supervision back to room. Pt was left sitting in WC in room, denied pain, all needs in reach.   Session 2  Pt received sitting in WC in room, denied pain. Pt self-propelled 150' to day room w/supervision. Attempted to perform sit <>stands, pt reported he had received laxative prior to session and needed to return to room to use bathroom. Pt self-propelled back to room 150' w/supervision and had gas, no longer felt need to have BM but stomach was "gurgling". Pt voided continently in urinal and spent remainder of session educating pt on transferring to [facility] in room, where to set up [facility] and how to use the drop-arms. Pt verbalized  understanding. Rearranged pt's room to make transfers optimal throughout night in case he received enema. Pt was left sitting in WC in room, all needs in reach.   Therapy Documentation Precautions:  Precautions Precautions: Fall Precaution Comments: new L BKA, chronic R BKA with prosthetic, wound vac Restrictions Weight Bearing Restrictions: Yes RLE Weight Bearing: Weight bearing as tolerated LLE Weight Bearing: Non weight bearing   Therapy/Group: Individual Therapy Joseph [name], PT, DPT  02/06/2021, 7:44 AM

## 2021-02-07 LAB — GLUCOSE, CAPILLARY
Glucose-Capillary: 106 mg/dL — ABNORMAL HIGH (ref 70–99)
Glucose-Capillary: 138 mg/dL — ABNORMAL HIGH (ref 70–99)
Glucose-Capillary: 155 mg/dL — ABNORMAL HIGH (ref 70–99)
Glucose-Capillary: 188 mg/dL — ABNORMAL HIGH (ref 70–99)

## 2021-02-07 MED ORDER — SENNOSIDES-DOCUSATE SODIUM 8.6-50 MG PO TABS
2.0000 | ORAL_TABLET | Freq: Every day | ORAL | Status: DC
  Administered 2021-02-07 – 2021-02-08 (×2): 2 via ORAL
  Filled 2021-02-07 (×2): qty 2

## 2021-02-07 NOTE — IPOC Note (Signed)
Overall Plan of Care [facility]) Patient Details Name: Joseph [name] MRN: [id number] DOB: November 26, 1945  Admitting Diagnosis: S/P bilateral below knee amputation [facility])  Hospital Problems: Principal Problem:   S/P bilateral below knee amputation [facility])     Functional Problem List: Nursing Bowel, Medication Management, Endurance, Pain, Safety, Skin Integrity  PT Balance, Edema, Endurance, Pain, Safety, Sensory, Skin Integrity  OT Balance, Pain, Endurance, Motor  SLP    TR         Basic ADL's: OT Bathing, Dressing, Toileting     Advanced  ADL's: OT Simple Meal Preparation, Light Housekeeping, Laundry     Transfers: PT Bed Mobility, Bed to Chair, Set designer, [employer], Civil Service fast streamer, Research scientist (life sciences): PT Ambulation, Psychologist, prison and probation services, Stairs     Additional Impairments: OT None  SLP        TR      Anticipated Outcomes Item Anticipated Outcome  Self Feeding no goal, pt is independent  Swallowing      Basic self-care  Mod I  Toileting  Mod I   Bathroom Transfers Mod I  Bowel/Bladder  manage bowel w mod I assist  Transfers  Mod I with LRAD  Locomotion  Mod I WC mobility. supervision assist ambulatoion  Communication     Cognition     Pain  at or below level 4  Safety/Judgment  maintain w cues   Therapy Plan: PT Intensity: Minimum of 1-2 x/day ,45 to 90 minutes PT Frequency: 5 out of 7 days PT Duration Estimated Length of Stay: 7-10 days OT Intensity: Minimum of 1-2 x/day, 45 to 90 minutes OT Frequency: 5 out of 7 days OT Duration/Estimated Length of Stay: 7-9 days     Due to the current state of emergency, patients may not be receiving their 3-hours of Medicare-mandated therapy.   Team Interventions: Nursing Interventions Disease Management/Prevention, Medication Management, Discharge Planning, Skin Care/Wound Management, Pain Management, Bowel Management, Patient/Family Education  PT interventions Ambulation/gait training, Designer, jewellery, Community reintegration, Discharge planning, DME/adaptive equipment instruction, Functional mobility training, Disease management/prevention, Neuromuscular re-education, Psychosocial support, Stair training, UE/LE Strength taining/ROM, Wheelchair propulsion/positioning, Pain management, Skin care/wound management, Therapeutic Activities, UE/LE Coordination activities, Patient/family education, Splinting/orthotics, Therapeutic Exercise, Visual/perceptual remediation/compensation  OT Interventions Balance/vestibular training, Discharge planning, Pain management, Functional mobility training, DME/adaptive equipment instruction, Patient/family education, Self Care/advanced ADL retraining, Psychosocial support, Therapeutic Activities, Therapeutic Exercise, UE/LE Strength taining/ROM, UE/LE Coordination activities  SLP Interventions    TR Interventions    SW/CM Interventions Discharge Planning, Psychosocial Support, Patient/Family Education, Disease Management/Prevention, Other (comment)   Barriers to Discharge MD  Medical stability  Nursing Lack of/limited family support, Home environment access/layout, Wound Care 2 level ramped entry solo, expecting room mate and children can assist  PT Decreased caregiver support, Weight bearing restrictions    OT      SLP      SW Wound Care     Team Discharge Planning: Destination: PT-Home ,OT- Home , SLP-  Projected Follow-up: PT-Home health PT, OT-  Home health OT, SLP-  Projected Equipment Needs: PT-To be determined, OT- None recommended by OT, SLP-  Equipment Details: PT-Pt has WC and RW., OT-  Patient/family involved in discharge planning: PT- Patient,  OT-Patient, SLP-   MD ELOS: 7-10 days Medical Rehab Prognosis:  Excellent Assessment: The patient has been admitted for CIR therapies with the diagnosis of new left bka, hx of right bka. The team will be addressing functional mobility, strength, stamina, balance, safety, adaptive techniques  and  equipment, self-care, bowel and bladder mgt, patient and caregiver education, pre-prosthetic education, pain mgt, coping skills. Goals have been set at mod I at w/c level for mobility and self-care.   Due to the current state of emergency, patients may not be receiving their 3 hours per day of Medicare-mandated therapy.    Ranelle Oyster, MD, FAAPMR     See Team Conference Notes for weekly updates to the plan of care

## 2021-02-07 NOTE — Progress Notes (Signed)
Slept good. PRN oxy ir 10mg 's given at 0047 & 0607. Wound VAC in place to left BKA, 0 drainage in cannister. Limb guard in place to left BKA. 0608 A

## 2021-02-07 NOTE — Progress Notes (Signed)
PROGRESS NOTE   Subjective/Complaints: Pain controlled. Moved bowels. Asked when vac was coming off.   ROS: Patient denies fever, rash, sore throat, blurred vision, nausea, vomiting, diarrhea, cough, shortness of breath or chest pain, joint or back pain, headache, or mood change.    Objective:   No results found. Recent Labs    02/05/21 0554  WBC 15.3*  HGB 9.4*  HCT 28.6*  PLT 381   Recent Labs    02/05/21 0554  NA 134*  K 3.9  CL 102  CO2 25  GLUCOSE 123*  BUN 16  CREATININE 1.24  CALCIUM 9.2    Intake/Output Summary (Last 24 hours) at 02/07/2021 0858 Last data filed at 02/07/2021 0823 Gross per 24 hour  Intake 956 ml  Output 1900 ml  Net -944 ml        Physical Exam: Vital Signs Blood pressure 132/79, pulse 65, temperature 98.7 F (37.1 C), temperature source Oral, resp. rate 18, height 6\' 3"  (1.905 m), weight 99.1 kg, SpO2 98 %.    Constitutional: No distress . Vital signs reviewed. HEENT: NCAT, EOMI, oral membranes moist Neck: supple Cardiovascular: RRR without murmur. No JVD    Respiratory/Chest: CTA Bilaterally without wheezes or rales. Normal effort    GI/Abdomen: BS +, non-tender, non-distended Ext: no clubbing, cyanosis, or edema Psych: pleasant and cooperative  Musculoskeletal:     Cervical back: Normal range of motion. No rigidity.     Comments: UE  5/5 in B/L arms RLE- 5/5 in HF/KE/KF- not wearing prosthesis LLE- at least 3/5 in HF- has limb guard and wound VAC in place- no swelling in proximal limb  Skin:    Comments: His right BKA is well-healed.  Left BKA with wound VAC  No other skin breakdown assessed L AC fossa IV- look OK  Neurological:     Comments: Patient is alert.  Sitting up in bed.  Oriented x3 and follows commands.   Assessment/Plan: 1. Functional deficits which require 3+ hours per day of interdisciplinary therapy in a comprehensive inpatient rehab  setting. Physiatrist is providing close team supervision and 24 hour management of active medical problems listed below. Physiatrist and rehab team continue to assess barriers to discharge/monitor patient progress toward functional and medical goals  Care Tool:  Bathing    Body parts bathed by patient: Right arm, Left arm     Body parts n/a: Right lower leg   Bathing assist Assist Level: Supervision/Verbal cueing     Upper Body Dressing/Undressing Upper body dressing   What is the patient wearing?: Pull over shirt    Upper body assist Assist Level: Set up assist    Lower Body Dressing/Undressing Lower body dressing      What is the patient wearing?: Underwear/pull up     Lower body assist Assist for lower body dressing: Supervision/Verbal cueing     Toileting Toileting    Toileting assist Assist for toileting: Moderate Assistance - Patient 50 - 74%     Transfers Chair/bed transfer  Transfers assist     Chair/bed transfer assist level: Minimal Assistance - Patient > 75%     Locomotion Ambulation   Ambulation assist  Assist level: Minimal Assistance - Patient > 75% Assistive device: Walker-rolling Max distance: 20   Walk 10 feet activity   Assist     Assist level: Minimal Assistance - Patient > 75% Assistive device: Walker-rolling   Walk 50 feet activity   Assist Walk 50 feet with 2 turns activity did not occur: Safety/medical concerns         Walk 150 feet activity   Assist Walk 150 feet activity did not occur: Safety/medical concerns         Walk 10 feet on uneven surface  activity   Assist Walk 10 feet on uneven surfaces activity did not occur: Safety/medical concerns         Wheelchair     Assist Is the patient using a wheelchair?: Yes Type of Wheelchair: Manual    Wheelchair assist level: Supervision/Verbal cueing, Set up assist Max wheelchair distance: >150'    Wheelchair 50 feet with 2 turns  activity    Assist        Assist Level: Supervision/Verbal cueing   Wheelchair 150 feet activity     Assist      Assist Level: Supervision/Verbal cueing   Blood pressure 132/79, pulse 65, temperature 98.7 F (37.1 C), temperature source Oral, resp. rate 18, height 6\' 3"  (1.905 m), weight 99.1 kg, SpO2 98 %.    Medical Problem List and Plan: 1.  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  Continue wound VAC as directed             -patient may not shower until VAC removed- will be removed 9/14             -ELOS/Goals: 9-12 days- mod I to supervision  -Continue CIR therapies including PT, OT  2.  Antithrombotics: -DVT/anticoagulation:  Pharmaceutical: Heparin             -antiplatelet therapy: Aspirin 81  mg daily 3. Pain Management: Oxycodone as needed 4. Mood: Provide emotional support             -antipsychotic agents: N/A 5. Neuropsych: This patient is capable of making decisions on his own behalf. 6. Skin/Wound Care: vac off 9/14--reminded pt 7. Fluids/Electrolytes/Nutrition: Routine in and outs with follow-up chemistries 8.  Acute blood loss anemia.  Follow-up CBC.  Continue iron supplement 9.  Diabetes mellitus with peripheral neuropathy.  Hemoglobin A1c 9.4.  SSI. Basaglar 15 unit daily  CBG (last 3)  Recent Labs    02/06/21 1643 02/06/21 2105 02/07/21 0600  GLUCAP 162* 99 106*    9/11 reasonable cbg control 10.  Hypertension.  Norvasc 10 mg daily.  Monitor with increased mobility 11.  Hyperlipidemia.  Lipitor 12.  CKD stage II.  Creatinine baseline 1.26-1.52.  Follow-up chemistries 13.  Diastolic congestive heart failure.  Monitor for any signs of fluid overload 14. Constipation-still no bm -+BM 9/10 after sorbitol   -pt is eating fruit, encouraged fluids too  -dc colace add senna-s 2 tabs at bedtime      LOS: 3 days A FACE TO FACE EVALUATION WAS PERFORMED  11/11 02/07/2021,  8:58 AM

## 2021-02-08 LAB — GLUCOSE, CAPILLARY
Glucose-Capillary: 144 mg/dL — ABNORMAL HIGH (ref 70–99)
Glucose-Capillary: 144 mg/dL — ABNORMAL HIGH (ref 70–99)
Glucose-Capillary: 150 mg/dL — ABNORMAL HIGH (ref 70–99)
Glucose-Capillary: 131 mg/dL — ABNORMAL HIGH (ref 70–99)

## 2021-02-08 NOTE — Progress Notes (Signed)
Physical Therapy Session Note  Patient Details  Name: Joseph [name] MRN: [id number] Date of Birth: 1946-01-18  Today's Date: 02/08/2021 PT Individual Time: 1030-1200 PT Individual Time Calculation (min): 90 min   Short Term Goals: Week 1:  PT Short Term Goal 1 (Week 1): STG=LTG due to ELOS  Skilled Therapeutic Interventions/Progress Updates:    Pt seated in w/c on arrival and agreeable to therapy. Pt reports some pain, unrated and controlled with medication. Pt propelled w/c with BUE with supervision throughout session for improved endurance and functional mobility, up to 200 ft.   Transfers: Stand pivot transfers with min A for balance with RW, cues for technique throughout. Scoot transfer w/c>mat table with CGA, pt required no cueing and performed safe transfer.  Gait Training: Gait with RW and min A for balance at times, x 8 ft, x 14 ft, x43 ft with therapist managing wound vac line. Pt demoes mild lean to R side with dynamic movement, hop to pattern.  NMR: Pt performed 3 rounds of standing 3 way hip and alternating shoulder touches for improved balance and confidence with dynamic movement. Pt demoes heavy reliance on R arm, frequently leaning toward that side. Pt reports fatigue d/t using all body systems to perform activities.   TherEx: Pt performed UBE on iso strength program x 20 min, alternating forward and reverse every 2 min for improved endurance and UB strength for transfers and functional mobility.   Pt returned to room and remained in w/c. Pt was left with all needs in reach and alarm active.   Therapy Documentation Precautions:  Precautions Precautions: Fall Precaution Comments: new L BKA, chronic R BKA with prosthetic, wound vac Restrictions Weight Bearing Restrictions: Yes RLE Weight Bearing: Weight bearing as tolerated LLE Weight Bearing: Non weight bearing General:   Vital Signs: Therapy Vitals BP: 117/69 Pain: Pain Assessment Pain Scale: 0-10 Pain  Score: 0-No pain Pain Type: Acute pain;Surgical pain Pain Location: Leg Pain Orientation: Left Pain Descriptors / Indicators: Aching Pain Frequency: Intermittent Pain Onset: On-going Patients Stated Pain Goal: 0 Pain Intervention(s): Medication (See eMAR) Mobility:   Locomotion :    Trunk/Postural Assessment :    Balance:   Exercises:   Other Treatments:      Therapy/Group: Individual Therapy  [name] 02/08/2021, 11:33 AM

## 2021-02-08 NOTE — Progress Notes (Signed)
Occupational Therapy Session Note  Patient Details  Name: Joseph [name] MRN: [id number] Date of Birth: December 29, 1945  Today's Date: 02/08/2021 OT Individual Time: 1300-1405 OT Individual Time Calculation (min): 65 min    Short Term Goals: Week 1:  OT Short Term Goal 1 (Week 1): Pt will transfer to [facility] over toilet with RW with S. OT Short Term Goal 2 (Week 1): Pt will complete toileting with S.  Skilled Therapeutic Interventions/Progress Updates:    Pt resting in w/c upon arrival. OT intervention with focus on bathing/dressing at w/c level with emphasis on safety awareness. Pt required min A for LB dressing to manage wound vac but completed all other tasks at supervision level including removing and donning RLE prosthesis. Pt requires more then a reasonable amount of time to complete all tasks. Pt remained in w/c with all needs within reach and belt alarm activated.   Therapy Documentation Precautions:  Precautions Precautions: Fall Precaution Comments: new L BKA, chronic R BKA with prosthetic, wound vac Restrictions Weight Bearing Restrictions: Yes RLE Weight Bearing: Weight bearing as tolerated LLE Weight Bearing: Non weight bearing Pain:  Pt denies pain this afternoon   Therapy/Group: Individual Therapy  Rich Brave 02/08/2021, 2:40 PM

## 2021-02-08 NOTE — Progress Notes (Signed)
Physical Therapy Session Note  Patient Details  Name: Joseph [name] MRN: [id number] Date of Birth: Apr 03, 1946  Today's Date: 02/08/2021 PT Individual Time: 6629-4765 PT Individual Time Calculation (min): 45 min   Short Term Goals: Week 1:  PT Short Term Goal 1 (Week 1): STG=LTG due to ELOS  Skilled Therapeutic Interventions/Progress Updates:    Pt received seated in bed, agreeable to PT session. Pt reports pain is well-controlled with medication this AM. Bed mobility mod I with use of bedrails. Pt is setup A to don new scrub top and bottoms while seated EOB. Pt is setup A to don RLE prosthetic while seated EOB. Squat pivot transfer to w/c with Supervision. Pt able to perform oral hygiene and wash face while seated in w/c at sink. Pt is at Supervision level for w/c mobility to/from therapy gym. Pt requires min A progressing to Supervision for management of w/c parts to set chair up for squat pivot transfer to mat table. Sit to stand x 5 reps with min A, cues for anterior weight shift and to shift weight onto RLE prosthetic, use of RW. Stand pivot transfer with RW and min A, cues for sequencing. Pt could benefit from ongoing practice with sit to stand and stand pivot transfers with RW. Pt left seated in w/c in room with needs in reach, quick release belt and chair alarm in place.  Therapy Documentation Precautions:  Precautions Precautions: Fall Precaution Comments: new L BKA, chronic R BKA with prosthetic, wound vac Restrictions Weight Bearing Restrictions: Yes RLE Weight Bearing: Weight bearing as tolerated LLE Weight Bearing: Non weight bearing     Therapy/Group: Individual Therapy   Peter Congo, PT, DPT, CSRS  02/08/2021, 11:46 AM

## 2021-02-09 LAB — GLUCOSE, CAPILLARY
Glucose-Capillary: 101 mg/dL — ABNORMAL HIGH (ref 70–99)
Glucose-Capillary: 167 mg/dL — ABNORMAL HIGH (ref 70–99)
Glucose-Capillary: 156 mg/dL — ABNORMAL HIGH (ref 70–99)
Glucose-Capillary: 146 mg/dL — ABNORMAL HIGH (ref 70–99)

## 2021-02-09 MED ORDER — SORBITOL 70 % SOLN
60.0000 mL | Freq: Once | Status: AC
  Administered 2021-02-09: 60 mL via ORAL
  Filled 2021-02-09: qty 60

## 2021-02-09 MED ORDER — SENNOSIDES-DOCUSATE SODIUM 8.6-50 MG PO TABS
3.0000 | ORAL_TABLET | Freq: Every day | ORAL | Status: DC
  Administered 2021-02-09 – 2021-02-14 (×6): 3 via ORAL
  Filled 2021-02-09 (×6): qty 3

## 2021-02-09 NOTE — Progress Notes (Signed)
Occupational Therapy Session Note  Patient Details  Name: Joseph [name] MRN: [id number] Date of Birth: Aug 25, 1945  Today's Date: 02/09/2021 OT Individual Time: 4098-1191 OT Individual Time Calculation (min): 60 min    Short Term Goals: Week 1:  OT Short Term Goal 1 (Week 1): Pt will transfer to [facility] over toilet with RW with S. OT Short Term Goal 2 (Week 1): Pt will complete toileting with S.   Skilled Therapeutic Interventions/Progress Updates:    Pt greeted at time of session sitting up in wheelchair agreeable to OT session, no pain throughout session, hand off from PT. Initial part of session spent rapport building and pt describing set up at home for bathroom access. Pt stating he will bring wheelchair to bathroom door and "walk" or do partial stand pivot using grab bars to get to toilet and shower. Relayed safety concerns, pt stating he wants to demonstrate in next session. Pt resistant to some safety tips regarding concerns for bathroom access. Therapist doffing limb guard to inspect skin and pt declining to allow therapist to doff shrinker sock at this time. Reapplied limb guard and pt self propelled room <> gym with Supervision. Supervision for squat pivot wheelchair > mat and CGA for stand pivot mat > wheelchair w/ RW. Pt also able to stand for 2:24 while performing unilateral UE task in standing to improve balance and tolerance. Pt up in chair alarm on call bell in reach in room.    Therapy Documentation Precautions:  Precautions Precautions: Fall Precaution Comments: new L BKA, chronic R BKA with prosthetic, wound vac Restrictions Weight Bearing Restrictions: Yes RLE Weight Bearing: Weight bearing as tolerated LLE Weight Bearing: Non weight bearing     Therapy/Group: Individual Therapy  Erasmo Score 02/09/2021, 12:14 PM

## 2021-02-09 NOTE — Progress Notes (Signed)
Physical Therapy Session Note  Patient Details  Name: Joseph [name] MRN: [id number] Date of Birth: [birth year]  Today's Date: 02/09/2021 PT Individual Time: 0800-0900,1403-1430 PT Individual Time Calculation (min): 60 min, 27 min   Short Term Goals: Week 1:  PT Short Term Goal 1 (Week 1): STG=LTG due to ELOS  Skilled Therapeutic Interventions/Progress Updates:    Session 1: pt received in bed and agreeable to therapy. No complaint of pain. Bed mobility from flat bed mod I, mod I to don LLE prosthesis and perform scoot transfer to w/c. Supervision w/c mobility to day room, including managing leg rests and setting up for transfers. Mod I transfer w/c<>mat table. Rest of session focused on balance and gait. Pt participated in 4 rounds of corn hole (x 2 with each hand), reaching across his body to retrieve and then throw bean bags. Pt required CGA for balance at times. Pt ambulated x 20 ft to retrieve bean bags and was able to bend to retrieve them from the board with min A for balance, CGA during gait with RW. Pt required extended rest break between rounds d/t fatigue. Pt demoed improved balance during functional tasks from previous session. Pt returned to room and remained in w/c, was left with all needs in reach and alarm active.   Session 2:  Pt seated in w/c on arrival and agreeable to therapy. Pt reports some pain, 6/10, therapy to tolerance, rest and repositioning PRN. Pt propelled w/c with BUE x 200 ft for endurance and functional mobility.  Pt performed the following exercises to promote LE strength and endurance:  -sidelying hip abduction 3 x 15 -SLR 3 x 15, pt demoes pelvic motion with this movement, corrected with VC -prone hip extension  Pt returned to room in same manner as above and opted to remain in w/c. Pt was left with all needs in reach and alarm active.    Therapy Documentation Precautions:  Precautions Precautions: Fall Precaution Comments: new L BKA, chronic R BKA with  prosthetic, wound vac Restrictions Weight Bearing Restrictions: Yes RLE Weight Bearing: Weight bearing as tolerated LLE Weight Bearing: Non weight bearing    Therapy/Group: Individual Therapy  [name] 02/09/2021, 12:10 PM

## 2021-02-09 NOTE — Progress Notes (Signed)
Occupational Therapy Session Note  Patient Details  Name: Joseph [name] MRN: [id number] Date of Birth: May 23, 1946  Today's Date: 02/09/2021 OT Individual Time: 1000-1100 OT Individual Time Calculation (min): 60 min    Short Term Goals: Week 1:  OT Short Term Goal 1 (Week 1): Pt will transfer to [facility] over toilet with RW with S. OT Short Term Goal 2 (Week 1): Pt will complete toileting with S.  Skilled Therapeutic Interventions/Progress Updates:    Pt resting in w/c upon arrival and agreeable to therapy. OT intervention with focus on BADL training at w/c level at sink, discharge planning, and activity tolerance to increase independence with BADLs. Pt completed bathing/dressing w/c level at sink utilizing lateral leans for LB bathing/dressing. Pt doffs/dons RLE prosthesis to facilitate doffing/donning pants. No unsafe behaviors noted during session. Pt owns BSC. W/c will not fit into bathroom. Pt remained in w/c with all needs within reach. Belt alarm activated.   Therapy Documentation Precautions:  Precautions Precautions: Fall Precaution Comments: new L BKA, chronic R BKA with prosthetic, wound vac Restrictions Weight Bearing Restrictions: Yes RLE Weight Bearing: Weight bearing as tolerated LLE Weight Bearing: Non weight bearing Pain: Pt denies pain at time of therapy   Therapy/Group: Individual Therapy  Rich Brave 02/09/2021, 11:17 AM

## 2021-02-09 NOTE — Progress Notes (Signed)
Patient ID: Joseph [name], male   DOB: 1946-05-23, 75 y.o.   MRN: [id number] Team Conference Report to Patient/Family  Team Conference discussion was reviewed with the patient and caregiver, including goals, any changes in plan of care and target discharge date.  Patient and caregiver express understanding and are in agreement.  The patient has a target discharge date of 02/15/21.  SW met with patient and called family at bedside. Provide pt and family with team conference updates. Pt progressing well, anticipating MOD I goals. Pt wound VAC removal tomorrow. Pt family requesting information on HC, sw will provide information. Request to verify [facility] [place] still in place, SW will follow up with coordinator. Pt has questions for physicians in regards to follow up with Dr. Sharol Given. No additional questions or concerns, will continue to follow up with progress and updates  Dyanne Iha 02/09/2021, 12:37 PM

## 2021-02-09 NOTE — Patient Care Conference (Signed)
Inpatient RehabilitationTeam Conference and Plan of Care Update Date: 02/09/2021   Time: 11:44 AM    Patient Name: Joseph [name]      Medical Record Number: [id number]  Date of Birth: Jun 07, 1945 Sex: Male         Room/Bed: 4W17C/4W17C-01 Payor Info: Payor: MEDICARE / Plan: MEDICARE PART A AND B / Product Type: *No Product type* /    Admit Date/Time:  02/04/2021  6:05 PM  Primary Diagnosis:  S/P bilateral below knee amputation [facility])  Hospital Problems: Principal Problem:   S/P bilateral below knee amputation [facility])    Expected Discharge Date: Expected Discharge Date: 02/15/21  Team Members Present: Physician leading conference: Dr. Genice Rouge Social Worker Present: Lavera Guise, BSW Nurse Present: Kennyth Arnold, RN PT Present: Bernie Covey, PT OT Present: Earleen Newport, OT PPS Coordinator present : Fae Pippin, SLP     Current Status/Progress Goal Weekly Team Focus  Bowel/Bladder   Patient is continent of B/B ;LBM 02/07/21  Maintain continence  Assess QS/PRN encourage patient to take stool softener and prn laxatives   Swallow/Nutrition/ Hydration             ADL's   Min A ADLs completed EOB using lateral leans and CGA-Min A for lateral scoot transfers  Mod I  Functional transfers, ADL retraining, dynamic sitting balance, d/c planning   Mobility   CGA transfers and gait, supervision w/c  mod I transfers and w/c, supervision gait  transfers, balance, functional mobility   Communication             Safety/Cognition/ Behavioral Observations            Pain   Surgical Left BKA iwth pain score 7-9/10, medicated wirh Oxy IR and prn Oxycodone prn Q4-6 hrs  > 3  QS/PRN assessment pain address and re-assess per protocol   Skin   Surgical incision Unable to assess has wounf vac with no dranage  Surgical incison( wound)  free of Infection  Assess QS/PRN note complications     Discharge Planning:  Patient to discharge home with intermittent A from children and  family. Daughter offered to stay with pt FT.   Team Discussion: Ordered Sorbitol for constipation. Wound vac comes off tomorrow, CKD stable, pain improving, CBG's stable. Continent B/B, Oxycodone for pain, educating on diabetes, wound care, and safety. Min assist lower body, supervision today. Lateral leans for hygiene. Working on Archivist. Mod I transfers. Working on [employer], balance and gait. Patient on target to meet rehab goals: yes  *See Care Plan and progress notes for long and short-term goals.   Revisions to Treatment Plan:  Ordered Sorbitol for constipation.  Teaching Needs: Family education, medication management, pain management, bowel management, skin/wound care, diabetes management, transfer training, gait training, W/C training, balance training, endurance training, safety awareness.  Current Barriers to Discharge: Decreased caregiver support, Medical stability, Home enviroment access/layout, Wound care, Lack of/limited family support, Weight, Weight bearing restrictions, and Medication compliance  Possible Resolutions to Barriers: Continue current medications, provide emotional support.     Medical Summary Current Status: constipated- LBM 3 days ago; conitnent B/B; VAC to come off tomorrow- pain ~ 7/10 before meds;  Barriers to Discharge: Decreased family/caregiver support;Home enviroment access/layout;Medical stability;Weight;Weight bearing restrictions;Wound care  Barriers to Discharge Comments: goal to d/c home with intermittent supervision/home alone. Daughter could take him home if need be. Possible Resolutions to [employer] Focus: focus- VAC off tomorrow; pain controlled- Sorbitol today for constipation- increased bowel meds to  3 pills/nightly; need to work on hygiene/toileting; still off balance slightly- d/c- 02/15/21   Continued Need for Acute Rehabilitation Level of Care: The patient requires daily medical management by a physician  with specialized training in physical medicine and rehabilitation for the following reasons: Direction of a multidisciplinary physical rehabilitation program to maximize functional independence : Yes Medical management of patient stability for increased activity during participation in an intensive rehabilitation regime.: Yes Analysis of laboratory values and/or radiology reports with any subsequent need for medication adjustment and/or medical intervention. : Yes   I attest that I was present, lead the team conference, and concur with the assessment and plan of the team.   Tennis Must 02/09/2021, 3:44 PM

## 2021-02-09 NOTE — Progress Notes (Signed)
PROGRESS NOTE   Subjective/Complaints:  Reminded pt VAC coming off tomorrow-  Said pain is 7/10 this AM before pain meds- calling for them now.  Per charge nurse, needs to have BM and asked for pt to receive Sorbitol- will order after therapy.   Went over with pt the plan for VAC off, healing, then measuring for prosthesis, etc.   ROS:  Pt denies SOB, abd pain, CP, N/V/C/D, and vision changes   Objective:   No results found. No results for input(s): WBC, HGB, HCT, PLT in the last 72 hours.  No results for input(s): NA, K, CL, CO2, GLUCOSE, BUN, CREATININE, CALCIUM in the last 72 hours.   Intake/Output Summary (Last 24 hours) at 02/09/2021 0915 Last data filed at 02/09/2021 0826 Gross per 24 hour  Intake 680 ml  Output 1500 ml  Net -820 ml        Physical Exam: Vital Signs Blood pressure (!) 120/57, pulse 74, temperature 98.3 F (36.8 C), resp. rate 18, height 6\' 3"  (1.905 m), weight 99.1 kg, SpO2 99 %.    General: awake, alert, appropriate, sitting up in bed; NAD HENT: conjugate gaze; oropharynx moist CV: regular rate; no JVD Pulmonary: CTA B/L; no W/R/R- good air movement GI: soft, NT, ND, (+)BS; hypoactive Psychiatric: appropriate; talkative, interactive Neurological: Ox3  Ext: no clubbing, cyanosis, or edema Psych: pleasant and cooperative  Musculoskeletal:     Cervical back: Normal range of motion. No rigidity.     Comments: UE  5/5 in B/L arms RLE- 5/5 in HF/KE/KF- not wearing prosthesis LLE- at least 3/5 in HF- has limb guard and wound VAC in place- no swelling in proximal limb  Skin:    Comments: His right BKA is well-healed.  Left BKA with wound VAC - no change No other skin breakdown assessed Neurological:     Comments: Patient is alert.  Sitting up in bed.  Oriented x3 and follows commands.   Assessment/Plan: 1. Functional deficits which require 3+ hours per day of interdisciplinary therapy  in a comprehensive inpatient rehab setting. Physiatrist is providing close team supervision and 24 hour management of active medical problems listed below. Physiatrist and rehab team continue to assess barriers to discharge/monitor patient progress toward functional and medical goals  Care Tool:  Bathing    Body parts bathed by patient: Right arm, Left arm, Chest, Abdomen, Front perineal area, Buttocks, Right upper leg, Left upper leg, Face     Body parts n/a: Right lower leg, Left lower leg   Bathing assist Assist Level: Supervision/Verbal cueing (w/c level)     Upper Body Dressing/Undressing Upper body dressing   What is the patient wearing?: Pull over shirt    Upper body assist Assist Level: Set up assist    Lower Body Dressing/Undressing Lower body dressing      What is the patient wearing?: Pants     Lower body assist Assist for lower body dressing: Minimal Assistance - Patient > 75%     Toileting Toileting    Toileting assist Assist for toileting: Moderate Assistance - Patient 50 - 74%     Transfers Chair/bed transfer  Transfers assist     Chair/bed  transfer assist level: Minimal Assistance - Patient > 75%     Locomotion Ambulation   Ambulation assist      Assist level: Minimal Assistance - Patient > 75% Assistive device: Walker-rolling Max distance: 43 ft   Walk 10 feet activity   Assist     Assist level: Minimal Assistance - Patient > 75% Assistive device: Walker-rolling   Walk 50 feet activity   Assist Walk 50 feet with 2 turns activity did not occur: Safety/medical concerns         Walk 150 feet activity   Assist Walk 150 feet activity did not occur: Safety/medical concerns         Walk 10 feet on uneven surface  activity   Assist Walk 10 feet on uneven surfaces activity did not occur: Safety/medical concerns         Wheelchair     Assist Is the patient using a wheelchair?: Yes Type of Wheelchair: Manual     Wheelchair assist level: Supervision/Verbal cueing Max wheelchair distance: >200 ft    Wheelchair 50 feet with 2 turns activity    Assist        Assist Level: Supervision/Verbal cueing   Wheelchair 150 feet activity     Assist      Assist Level: Supervision/Verbal cueing   Blood pressure (!) 120/57, pulse 74, temperature 98.3 F (36.8 C), resp. rate 18, height 6\' 3"  (1.905 m), weight 99.1 kg, SpO2 99 %.    Medical Problem List and Plan: 1.  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  Continue wound VAC as directed             -patient may not shower until VAC removed- will be removed 9/14             -ELOS/Goals: 9-12 days- mod I to supervision  Con't PT and OT/CIR- team conference today to determine length of stay 2.  Antithrombotics: -DVT/anticoagulation:  Pharmaceutical: Heparin             -antiplatelet therapy: Aspirin 81  mg daily 3. Pain Management: Oxycodone as needed 4. Mood: Provide emotional support             -antipsychotic agents: N/A 5. Neuropsych: This patient is capable of making decisions on his own behalf. 6. Skin/Wound Care: vac off 9/14--reminded pt 7. Fluids/Electrolytes/Nutrition: Routine in and outs with follow-up chemistries 8.  Acute blood loss anemia.  Follow-up CBC.  Continue iron supplement 9.  Diabetes mellitus with peripheral neuropathy.  Hemoglobin A1c 9.4.  SSI. Basaglar 15 unit daily  CBG (last 3)  Recent Labs    02/08/21 1616 02/08/21 2053 02/09/21 0606  GLUCAP 150* 131* 101*    9/13- BG's look good- con't regimen 10.  Hypertension.  Norvasc 10 mg daily.  Monitor with increased mobility 11.  Hyperlipidemia.  Lipitor 12.  CKD stage II.  Creatinine baseline 1.26-1.52.  Follow-up chemistries  9/13- will order labs in AM since was last done at admission 13.  Diastolic congestive heart failure.  Monitor for any signs of fluid overload 14. Constipation-still no  bm -+BM 9/10 after sorbitol   -pt is eating fruit, encouraged fluids too  -dc colace add senna-s 2 tabs at bedtime 9/13- will order Sorbitol 60 cc after therapy per charge nurse and increase bowel meds.       LOS: 5 days A FACE TO FACE EVALUATION WAS PERFORMED  Stasha Naraine 02/09/2021, 9:15 AM

## 2021-02-10 ENCOUNTER — Other Ambulatory Visit: Payer: Self-pay | Admitting: *Deleted

## 2021-02-10 DIAGNOSIS — Z89511 Acquired absence of right leg below knee: Secondary | ICD-10-CM

## 2021-02-10 DIAGNOSIS — E1142 Type 2 diabetes mellitus with diabetic polyneuropathy: Secondary | ICD-10-CM

## 2021-02-10 DIAGNOSIS — D72829 Elevated white blood cell count, unspecified: Secondary | ICD-10-CM

## 2021-02-10 DIAGNOSIS — R7309 Other abnormal glucose: Secondary | ICD-10-CM

## 2021-02-10 LAB — GLUCOSE, CAPILLARY
Glucose-Capillary: 111 mg/dL — ABNORMAL HIGH (ref 70–99)
Glucose-Capillary: 168 mg/dL — ABNORMAL HIGH (ref 70–99)
Glucose-Capillary: 96 mg/dL (ref 70–99)
Glucose-Capillary: 155 mg/dL — ABNORMAL HIGH (ref 70–99)

## 2021-02-10 LAB — CBC WITH DIFFERENTIAL/PLATELET
Abs Immature Granulocytes: 0 10*3/uL (ref 0.00–0.07)
Basophils Absolute: 0.3 10*3/uL — ABNORMAL HIGH (ref 0.0–0.1)
Basophils Relative: 2 %
Eosinophils Absolute: 0.4 10*3/uL (ref 0.0–0.5)
Eosinophils Relative: 3 %
HCT: 30.1 % — ABNORMAL LOW (ref 39.0–52.0)
Hemoglobin: 10 g/dL — ABNORMAL LOW (ref 13.0–17.0)
Lymphocytes Relative: 39 %
Lymphs Abs: 5 10*3/uL — ABNORMAL HIGH (ref 0.7–4.0)
MCH: 29.8 pg (ref 26.0–34.0)
MCHC: 33.2 g/dL (ref 30.0–36.0)
MCV: 89.6 fL (ref 80.0–100.0)
Monocytes Absolute: 0.5 10*3/uL (ref 0.1–1.0)
Monocytes Relative: 4 %
Neutro Abs: 6.6 10*3/uL (ref 1.7–7.7)
Neutrophils Relative %: 52 %
Platelets: 443 10*3/uL — ABNORMAL HIGH (ref 150–400)
RBC: 3.36 MIL/uL — ABNORMAL LOW (ref 4.22–5.81)
RDW: 12.7 % (ref 11.5–15.5)
WBC: 12.7 10*3/uL — ABNORMAL HIGH (ref 4.0–10.5)
nRBC: 0 /100 WBC
nRBC: 0.2 % (ref 0.0–0.2)

## 2021-02-10 LAB — BASIC METABOLIC PANEL
Anion gap: 8 (ref 5–15)
BUN: 31 mg/dL — ABNORMAL HIGH (ref 8–23)
CO2: 28 mmol/L (ref 22–32)
Calcium: 9.3 mg/dL (ref 8.9–10.3)
Chloride: 97 mmol/L — ABNORMAL LOW (ref 98–111)
Creatinine, Ser: 1.32 mg/dL — ABNORMAL HIGH (ref 0.61–1.24)
GFR, Estimated: 57 mL/min — ABNORMAL LOW (ref >60)
Glucose, Bld: 121 mg/dL — ABNORMAL HIGH (ref 70–99)
Potassium: 4.3 mmol/L (ref 3.5–5.1)
Sodium: 133 mmol/L — ABNORMAL LOW (ref 135–145)

## 2021-02-10 NOTE — Progress Notes (Signed)
Wound vac removed. Pt tolerated well. Incision approximated.

## 2021-02-10 NOTE — Progress Notes (Signed)
Physical Therapy Session Note  Patient Details  Name: Joseph [name] MRN: [id number] Date of Birth: 1945-12-03  Today's Date: 02/10/2021 PT Individual Time: 0900-1015 PT Individual Time Calculation (min): 75 min   Short Term Goals: Week 1:  PT Short Term Goal 1 (Week 1): STG=LTG due to ELOS  Skilled Therapeutic Interventions/Progress Updates:    pt received in bed and agreeable to therapy. No complaint of pain. Bed mobility and donning prosthetic mod I. Scoot transfer to w/c mod I. Session focused on mobility in outdoor environment for community integration and endurance. Pt propelled w/c with BUE over 500 ft throughout session, over uneven surface, and up/down 150 ft slope. Pt required occ rest breaks d/t fatigue. Gait training on uneven surfaces x 125 ft and x 75 with supervision and assist to manage wound vac. Pt fatigues quickly d/t the demands of hop to gait pattern, but maintained safe technique and requested a break when needed, demoing appropriate energy conservation technique. Pt also transferred outdoor bench>w/c with supervision in new environment. Pt returned to room after session and remained in w/c, was left with all needs in reach and alarm active.   Therapy Documentation Precautions:  Precautions Precautions: Fall Precaution Comments: new L BKA, chronic R BKA with prosthetic, wound vac Restrictions Weight Bearing Restrictions: Yes RLE Weight Bearing: Weight bearing as tolerated LLE Weight Bearing: Non weight bearing   Therapy/Group: Individual Therapy  [name] 02/10/2021, 10:19 AM

## 2021-02-10 NOTE — Progress Notes (Signed)
Occupational Therapy Session Note  Patient Details  Name: Joseph [name] MRN: [id number] Date of Birth: 1945-11-03  Today's Date: 02/10/2021 OT Individual Time: 3546-5681 and 2751-7001 and 7494-4967 OT Individual Time Calculation (min): 54 min and 57 min and 28 min   Short Term Goals: Week 1:  OT Short Term Goal 1 (Week 1): Pt will transfer to [facility] over toilet with RW with S. OT Short Term Goal 2 (Week 1): Pt will complete toileting with S.  Skilled Therapeutic Interventions/Progress Updates:    Pt greeted at time of session sitting up in wheelchair agreeable to OT session, no pain. Discussion throughout session regarding bathroom set up and accessibility. Pt continues to be adamant that he can stand from wheelchair outside bathroom, hold on to grab bars and "walk" along bars to toilet and tub. Educated on using RW, pt ambulated wheelchair <> BSC over toilet with CGA and RW with seated rest break in between walking. Pt states RW will not fit in bathroom, educated on hopping side ways and then righting RW once through doorway and pt agreeable, plan to practice in the future. Self propel room <> tub shower room and therapist demonstrated TTB transfer, discussing sequence of donning/doffing prosthetic and covering residual limb for showers. Pt already has TTB and states he is comfortable with this. RN entered at this time for med pass. Self propel back to room, alarm on call bell in reach and hand off to MD making rounds.   Session 2: Pt greeted at time of session sitting up in wheelchair no pain, agreeable to OT session to focus on bathroom transfers and mobility with RW for ADLs. Self propel room <> tub shower room Supervision and pt performing short distance ambulation into bathroom w/ RW with CGA to/from TTB. TTB transfer CGA overall and discussed sequencing of removing and donning prosthetic, covering residual limb, etc. Note pt hyperverbal and needing cues for redirecting throughout. Pt also having  questions about follow up after leaving, will relay to SW. Back in room set up with alarm on call bell in reach.    Session 3: Pt greeted at time of session sitting up in wheelchair no pain, agreeable to OT session. D/t limited time, focused on BUE strengthening in room with 10# dowel for the following 1x30 reps for bicep curl, chest press, FWD and backward circles with pt demonstrating good form but did fatigue. Requesting to lay down for wound vac removal, squat pivot to bed Supervision. Alarm on call bell in reach.   Therapy Documentation Precautions:  Precautions Precautions: Fall Precaution Comments: new L BKA, chronic R BKA with prosthetic, wound vac Restrictions Weight Bearing Restrictions: Yes RLE Weight Bearing: Weight bearing as tolerated LLE Weight Bearing: Non weight bearing     Therapy/Group: Individual Therapy  Erasmo Score 02/10/2021, 7:27 AM

## 2021-02-10 NOTE — Patient Outreach (Signed)
[facility]) Care Management  02/10/2021  Joseph [name] 1946-04-10 [id number]   [facility] Care coordination- [facility] inpatient rehab stay  [name] was [facility] after [facility] ([facility])  He remains in CIR at this time of this note LOS 6 days RN CM completed [facility] template for [facility] multidisciplinary care discussion for 02/12/21 Noted CIR SW note indicates patient target discharge date of 02/15/21    Plan [facility] RN CM will follow up with patient within the next 14 business days  Heydy Montilla L. [name], RN, BSN, CCM [facility] Telephonic Care Management Care Coordinator Office number [phone] Mobile number [phone]  Main [facility] number [phone] Fax number [phone]

## 2021-02-10 NOTE — Progress Notes (Signed)
PROGRESS NOTE   Subjective/Complaints: Patient seen laying in bed this morning.  He states he slept well overnight.  He states he had 2 bowel movements last night.  He has several questions regarding wound VAC and dressing changes post DC VAC.  ROS: Denies CP, SOB, N/V/D  Objective:   No results found. Recent Labs    02/10/21 0503  WBC 12.7*  HGB 10.0*  HCT 30.1*  PLT 443*    Recent Labs    02/10/21 0503  NA 133*  K 4.3  CL 97*  CO2 28  GLUCOSE 121*  BUN 31*  CREATININE 1.32*  CALCIUM 9.3     Intake/Output Summary (Last 24 hours) at 02/10/2021 1225 Last data filed at 02/10/2021 0900 Gross per 24 hour  Intake 240 ml  Output 1200 ml  Net -960 ml         Physical Exam: Vital Signs Blood pressure 112/68, pulse 71, temperature 98.4 F (36.9 C), temperature source Oral, resp. rate 17, height 6\' 3"  (1.905 m), weight 99.1 kg, SpO2 98 %. Constitutional: No distress . Vital signs reviewed. HENT: Normocephalic.  Atraumatic. Eyes: EOMI. No discharge. Cardiovascular: No JVD.  RRR. Respiratory: Normal effort.  No stridor.  Bilateral clear to auscultation. GI: Non-distended.  BS +. Skin: Warm and dry.  Left BKA with wound VAC Psych: Normal mood.  Normal behavior. Musc: No edema in extremities.  No tenderness in extremities. Neuro: Alert Motor: Bilateral upper extremities: 5/5 in B/L arms RLE- 5/5 in HF/KE, unchanged LLE-4+/5 hip flexion, knee extension   Assessment/Plan: 1. Functional deficits which require 3+ hours per day of interdisciplinary therapy in a comprehensive inpatient rehab setting. Physiatrist is providing close team supervision and 24 hour management of active medical problems listed below. Physiatrist and rehab team continue to assess barriers to discharge/monitor patient progress toward functional and medical goals  Care Tool:  Bathing    Body parts bathed by patient: Right arm, Left arm,  Chest, Abdomen, Front perineal area, Buttocks, Right upper leg, Left upper leg, Face     Body parts n/a: Right lower leg, Left lower leg   Bathing assist Assist Level: Supervision/Verbal cueing (w/c level)     Upper Body Dressing/Undressing Upper body dressing   What is the patient wearing?: Pull over shirt    Upper body assist Assist Level: Set up assist    Lower Body Dressing/Undressing Lower body dressing      What is the patient wearing?: Pants     Lower body assist Assist for lower body dressing: Minimal Assistance - Patient > 75%     Toileting Toileting    Toileting assist Assist for toileting: Moderate Assistance - Patient 50 - 74%     Transfers Chair/bed transfer  Transfers assist     Chair/bed transfer assist level: Minimal Assistance - Patient > 75%     Locomotion Ambulation   Ambulation assist      Assist level: Minimal Assistance - Patient > 75% Assistive device: Walker-rolling Max distance: 43 ft   Walk 10 feet activity   Assist     Assist level: Minimal Assistance - Patient > 75% Assistive device: Walker-rolling   Walk 50 feet  activity   Assist Walk 50 feet with 2 turns activity did not occur: Safety/medical concerns         Walk 150 feet activity   Assist Walk 150 feet activity did not occur: Safety/medical concerns         Walk 10 feet on uneven surface  activity   Assist Walk 10 feet on uneven surfaces activity did not occur: Safety/medical concerns         Wheelchair     Assist Is the patient using a wheelchair?: Yes Type of Wheelchair: Manual    Wheelchair assist level: Supervision/Verbal cueing Max wheelchair distance: >200 ft    Wheelchair 50 feet with 2 turns activity    Assist        Assist Level: Supervision/Verbal cueing   Wheelchair 150 feet activity     Assist      Assist Level: Supervision/Verbal cueing   Medical Problem List and Plan: 1.  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  Continue wound VAC as directed Continue CIR 2.  Antithrombotics: -DVT/anticoagulation:  Pharmaceutical: Heparin             -antiplatelet therapy: Aspirin 81  mg daily 3. Pain Management: Oxycodone as needed 4. Mood: Provide emotional support             -antipsychotic agents: N/A 5. Neuropsych: This patient is capable of making decisions on his own behalf. 6. Skin/Wound Care:  DC VAC on 9/14 7. Fluids/Electrolytes/Nutrition: Routine in and outs with follow-up chemistries 8.  Acute blood loss anemia.  Continue iron supplement 9.  Diabetes mellitus with peripheral neuropathy.  Hemoglobin A1c 9.4.  SSI. Basaglar 15 unit daily  CBG (last 3)  Recent Labs    02/09/21 1622 02/09/21 2156 02/10/21 0609  GLUCAP 156* 146* 111*     Slightly labile on 9/14 10.  Hypertension.  Norvasc 10 mg daily.  Monitor with increased mobility  Controlled on 9/14 11.  Hyperlipidemia.  Lipitor 12.  CKD stage II.  Creatinine baseline 1.26-1.52.  Follow-up chemistries  Creatinine 1.32 on 9/14 13.  Diastolic congestive heart failure.  Monitor for any signs of fluid overload 14. Constipation Improving 15.  Leukocytosis  WBCs 12.7 on 9/14  Afebrile, no signs/symptoms of infection  Continue to monitor   LOS: 6 days A FACE TO FACE EVALUATION WAS PERFORMED  Joseph [name] Karis Juba 02/10/2021, 12:25 PM

## 2021-02-11 LAB — GLUCOSE, CAPILLARY
Glucose-Capillary: 120 mg/dL — ABNORMAL HIGH (ref 70–99)
Glucose-Capillary: 171 mg/dL — ABNORMAL HIGH (ref 70–99)
Glucose-Capillary: 135 mg/dL — ABNORMAL HIGH (ref 70–99)
Glucose-Capillary: 151 mg/dL — ABNORMAL HIGH (ref 70–99)

## 2021-02-11 NOTE — Progress Notes (Signed)
PROGRESS NOTE   Subjective/Complaints: Patient seen sitting up in his chair this morning.  He states he slept well overnight.  He states he is doing well after the wound VAC was removed yesterday.  ROS: Denies CP, SOB, N/V/D  Objective:   No results found. Recent Labs    02/10/21 0503  WBC 12.7*  HGB 10.0*  HCT 30.1*  PLT 443*     Recent Labs    02/10/21 0503  NA 133*  K 4.3  CL 97*  CO2 28  GLUCOSE 121*  BUN 31*  CREATININE 1.32*  CALCIUM 9.3      Intake/Output Summary (Last 24 hours) at 02/11/2021 1051 Last data filed at 02/11/2021 0751 Gross per 24 hour  Intake 800 ml  Output 1025 ml  Net -225 ml         Physical Exam: Vital Signs Blood pressure 114/70, pulse 69, temperature 98.9 F (37.2 C), temperature source Oral, resp. rate 16, height 6\' 3"  (1.905 m), weight 99.1 kg, SpO2 93 %. Constitutional: No distress . Vital signs reviewed. HENT: Normocephalic.  Atraumatic. Eyes: EOMI. No discharge. Cardiovascular: No JVD.  RRR. Respiratory: Normal effort.  No stridor.  Bilateral clear to auscultation. GI: Non-distended.  BS +. Skin: Warm and dry.  Left BKA with staples CDI. Psych: Normal mood.  Normal behavior. Musc: No edema in extremities.  No tenderness in extremities. Neuro: Alert Motor: Bilateral upper extremities: 5/5 in B/L arms RLE- 5/5 in HF/KE, unchanged LLE-4+/5 hip flexion, knee extension, stable   Assessment/Plan: 1. Functional deficits which require 3+ hours per day of interdisciplinary therapy in a comprehensive inpatient rehab setting. Physiatrist is providing close team supervision and 24 hour management of active medical problems listed below. Physiatrist and rehab team continue to assess barriers to discharge/monitor patient progress toward functional and medical goals  Care Tool:  Bathing    Body parts bathed by patient: Right arm, Left arm, Chest, Abdomen, Front perineal  area, Buttocks, Right upper leg, Left upper leg, Face     Body parts n/a: Right lower leg, Left lower leg   Bathing assist Assist Level: Supervision/Verbal cueing (w/c level)     Upper Body Dressing/Undressing Upper body dressing   What is the patient wearing?: Pull over shirt    Upper body assist Assist Level: Set up assist    Lower Body Dressing/Undressing Lower body dressing      What is the patient wearing?: Pants     Lower body assist Assist for lower body dressing: Minimal Assistance - Patient > 75%     Toileting Toileting    Toileting assist Assist for toileting: Moderate Assistance - Patient 50 - 74%     Transfers Chair/bed transfer  Transfers assist     Chair/bed transfer assist level: Minimal Assistance - Patient > 75%     Locomotion Ambulation   Ambulation assist      Assist level: Minimal Assistance - Patient > 75% Assistive device: Walker-rolling Max distance: 43 ft   Walk 10 feet activity   Assist     Assist level: Minimal Assistance - Patient > 75% Assistive device: Walker-rolling   Walk 50 feet activity   Assist Walk  50 feet with 2 turns activity did not occur: Safety/medical concerns         Walk 150 feet activity   Assist Walk 150 feet activity did not occur: Safety/medical concerns         Walk 10 feet on uneven surface  activity   Assist Walk 10 feet on uneven surfaces activity did not occur: Safety/medical concerns         Wheelchair     Assist Is the patient using a wheelchair?: Yes Type of Wheelchair: Manual    Wheelchair assist level: Supervision/Verbal cueing Max wheelchair distance: >200 ft    Wheelchair 50 feet with 2 turns activity    Assist        Assist Level: Supervision/Verbal cueing   Wheelchair 150 feet activity     Assist      Assist Level: Supervision/Verbal cueing   Medical Problem List and Plan: 1.  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   Continue CIR 2.  Antithrombotics: -DVT/anticoagulation:  Pharmaceutical: Heparin             -antiplatelet therapy: Aspirin 81  mg daily 3. Pain Management: Oxycodone as needed 4. Mood: Provide emotional support             -antipsychotic agents: N/A 5. Neuropsych: This patient is capable of making decisions on his own behalf. 6. Skin/Wound Care:  DCed VAC on 9/14 7. Fluids/Electrolytes/Nutrition: Routine in and outs with follow-up chemistries 8.  Acute blood loss anemia.  Continue iron supplement 9.  Diabetes mellitus with peripheral neuropathy.  Hemoglobin A1c 9.4.  SSI. Basaglar 15 unit daily  CBG (last 3)  Recent Labs    02/10/21 1705 02/10/21 2100 02/11/21 0553  GLUCAP 96 155* 120*     Relatively controlled on 9/15 10.  Hypertension.  Norvasc 10 mg daily.  Monitor with increased mobility  Controlled on 9/15 11.  Hyperlipidemia.  Lipitor 12.  CKD stage II.  Creatinine baseline 1.26-1.52.  Follow-up chemistries  Creatinine 1.32 on 9/14 13.  Diastolic congestive heart failure.  Monitor for any signs of fluid overload 14. Constipation Improving 15.  Leukocytosis  WBCs 12.7 on 9/14  Afebrile, no signs/symptoms of infection  Continue to monitor   LOS: 7 days A FACE TO FACE EVALUATION WAS PERFORMED  Patrice Moates Karis Juba 02/11/2021, 10:51 AM

## 2021-02-11 NOTE — Progress Notes (Signed)
Patient ID: Joseph [name], male   DOB: 1945-09-09, 75 y.o.   MRN: [id number]  Pt chart reports active [facility] status. Sw will follow up on current status and send Custodial Care Benefits.  [place], [place] [phone]

## 2021-02-11 NOTE — Progress Notes (Signed)
Physical Therapy Session Note  Patient Details  Name: Joseph [name] MRN: [id number] Date of Birth: August 07, 1945  Today's Date: 02/11/2021 PT Individual Time: 0800-0900, 1100-1200 PT Individual Time Calculation (min): 60 min, 60 min  Short Term Goals: Week 1:  PT Short Term Goal 1 (Week 1): STG=LTG due to ELOS  Skilled Therapeutic Interventions/Progress Updates:    Session 1 pt received in bed and agreeable to therapy. Pt c/o 9/10 pain due to removing wound vac. Nsg present to administer pain medication during session. Sit>supine independently, pt then donned L shrinker and limb guard and RLE prosthetic mod I. Pt propelled w/c with BUE throughout session for endurance and functional mobility, up to 200 ft. Pt performed squat pivot transfers bed>w/c<>mat table with supervision, occ cueing for management of arm rests. Pt performed 3 x 10 LAQ with LLE to maintain ROM in surgical leg. Pt then performed car transfer with supervision and squat pivot technique. Pt required minimal cues for safe technique. Pt then performed hills program on UBE, max level 6, x 6 min for UE strength and endurance. Pt returned to room and remained in w/c, was left with all needs in reach and alarm active.   Session 2: Pt seated in w/c on arrival and agreeable to therapy. No complaint of pain at this time. Session focused on gait training for improved functional mobility at d/c. Pt propelled w/c throughout session, mod I for improved independence and function. Pt ambulated 4 bouts of ~75 ft with hop to pattern, occ cues for upright posture. Pt required extended rest breaks d/t fatigue. Pt requested to return to room to use the bathroom. Required extended time with no void but pt was independent for for 3/3 toileting tasks. Pt opted to remain in w/c at end of session and was left with all needs in reach and alarm active.   Therapy Documentation Precautions:  Precautions Precautions: Fall Precaution Comments: new L BKA, chronic  R BKA with prosthetic, wound vac Restrictions Weight Bearing Restrictions: Yes RLE Weight Bearing: Non weight bearing LLE Weight Bearing: Non weight bearing   Therapy/Group: Individual Therapy  [name] 02/11/2021, 11:19 AM

## 2021-02-11 NOTE — Progress Notes (Signed)
Occupational Therapy Session Note  Patient Details  Name: Montenegro Grimes MRN: [id number] Date of Birth: 1945/09/26  Today's Date: 02/11/2021 OT Individual Time: 6979-4801 OT Individual Time Calculation (min): 30 min  and Today's Date: 02/11/2021 OT Missed Time: 15 Minutes Missed Time Reason: Other (comment) (previous pt care)   Short Term Goals: Week 1:  OT Short Term Goal 1 (Week 1): Pt will transfer to [facility] over toilet with RW with S. OT Short Term Goal 2 (Week 1): Pt will complete toileting with S.   Skilled Therapeutic Interventions/Progress Updates:    Pt greeted at time of session sitting up in wheelchair no pain, agreeable to OT session. Pt missed 15 mins at beginning of session d/t therapist providing previous patient care. Pt wanting to go outside, self propel wheelchair room > outside through obstacles in hall and in/out of elevator with Supervision and minimal fatigue noted. Outside, pt continued to maneuver obstacles around chairs, tables, and up/down uneven surfaces with BUE propulsion. Therapist transport back inside d/t time constraints. Pt set up in room alarm on call bell in reach.   Therapy Documentation Precautions:  Precautions Precautions: Fall Precaution Comments: new L BKA, chronic R BKA with prosthetic, wound vac Restrictions Weight Bearing Restrictions: Yes RLE Weight Bearing: Non weight bearing LLE Weight Bearing: Non weight bearing      Therapy/Group: Individual Therapy  Erasmo Score 02/11/2021, 7:23 AM

## 2021-02-11 NOTE — Progress Notes (Signed)
Occupational Therapy Session Note  Patient Details  Name: Joseph [name] MRN: [id number] Date of Birth: 1945-06-25  Today's Date: 02/11/2021 OT Individual Time: 5974-1638 OT Individual Time Calculation (min): 45 min    Short Term Goals: Week 1:  OT Short Term Goal 1 (Week 1): Pt will transfer to [facility] over toilet with RW with S. OT Short Term Goal 2 (Week 1): Pt will complete toileting with S.  Skilled Therapeutic Interventions/Progress Updates:    Pt  completed bathing and dressing sitting at the sink.  He was able to complete all bathing in sitting with laterals lean side to side for removing pants and for washing buttocks.  He declined wanting to stand stating that this was normally how he would do it at home, with lateral leans on the tub bench.  Setup for all dressing in seated position as well including donning his limb guard.  Finished session with pt resting in the wheelchair at bedside with the call button and phone in reach.  Safety alarm belt in place as well.    Therapy Documentation Precautions:  Precautions Precautions: Fall Precaution Comments: new L BKA, chronic R BKA with prosthetic, wound vac Restrictions Weight Bearing Restrictions: No RLE Weight Bearing: Non weight bearing LLE Weight Bearing: Non weight bearing  Pain: Pain Assessment Pain Scale: 0-10 Pain Score: 6  Faces Pain Scale: Hurts a little bit Pain Type: Acute pain;Surgical pain Pain Location: Calf Pain Orientation: Left Pain Descriptors / Indicators: Aching Pain Frequency: Constant Pain Onset: On-going Pain Intervention(s): Repositioned ADL: See Care Tool Section for some details of mobility and selfcare   Therapy/Group: Individual Therapy  Cyndal Kasson OTR/L 02/11/2021, 12:13 PM

## 2021-02-11 NOTE — Progress Notes (Signed)
Occupational Therapy Session Note  Patient Details  Name: Joseph [name] MRN: [id number] Date of Birth: 03-03-1946  Today's Date: 02/12/2021 OT Individual Time: 1101-1201 and 1450-1535 OT Individual Time Calculation (min): 60 min and 45 min   Short Term Goals: Week 1:  OT Short Term Goal 1 (Week 1): Pt will transfer to [facility] over toilet with RW with S. OT Short Term Goal 2 (Week 1): Pt will complete toileting with S.  Skilled Therapeutic Interventions/Progress Updates:    Pt greeted in the w/c with no c/o pain. Already dressed and ready for the day. Started session with d/c planning. Pt reports he already has a stretching/strengthening routine at home addressing his UB. Already has ideas about how he can manage in the kitchen given his L shaped countertop at home. We reviewed his educational materials in his health resource notebook. Pt very appreciative that we reviewed this, as he did not know that this notebook belonged to him. Pt agreeable to take a trip to a gym area and practice some B LE exercises that he plans to incorporate into his daily routine at home. Discussed using the bed with firmest mattress at home and completing exercises when he wakes up and before he goes to bed at night. Pt performed hip extension exercises while in prone and sidelying positions in prep for future prosthetic. Min demonstrational cuing for increasing therapeutic challenge, as he can perform prone on elbows but not yet prone<hands. Pt motivated to improve this. Afterwards pt self propelled back to the room. Issued him a LH mirror and discussed importance of routine residual limb inspection during his self care routine. Pt very collaborative during all education/discharge discussions today. Call bell within reach upon OT departure.   2nd Session 1:1 tx (45 min) Pt greeted in the w/c with no c/o pain. Agreeable to session. OT printed him off a HEP and we reviewed the program together, emphasizing importance of routine  exercise engagement. While outdoors, pt self propelled his w/c over uneven surfaces and up/down inclines. He reported that his home is w/c accessible and he can complete all IADL tasks at Mod I level. Has no concerns regarding d/c and feels ready for home. Pt was returned to the room at close of session, all needs within reach.   Therapy Documentation Precautions:  Precautions Precautions: Fall Precaution Comments: new L BKA, chronic R BKA with prosthetic, wound vac Restrictions Weight Bearing Restrictions: Yes RLE Weight Bearing: Non weight bearing LLE Weight Bearing: Non weight bearing  ADL: ADL Eating: Independent Grooming: Independent Where Assessed-Grooming: Sitting at sink Upper Body Bathing: Setup Where Assessed-Upper Body Bathing: Edge of bed Lower Body Bathing: Supervision/safety Where Assessed-Lower Body Bathing: Edge of bed Upper Body Dressing: Setup Where Assessed-Upper Body Dressing: Edge of bed Lower Body Dressing: Supervision/safety Where Assessed-Lower Body Dressing: Edge of bed Toileting: Minimal assistance Where Assessed-Toileting: Bedside Commode Toilet Transfer: Minimal assistance Toilet Transfer Method: Squat pivot Toilet Transfer Equipment: Drop arm bedside commode     Therapy/Group: Individual Therapy  Joseph [name] A Joseph [name] 02/12/2021, 12:26 PM

## 2021-02-11 NOTE — Progress Notes (Signed)
Patient ID: Joseph [name], male   DOB: [birth year], 75 y.o.   MRN: [id number]  SW met with pt in room, Pt RN student present in room. SW received notification from OT that patient would like to discuss [facility] for wound care and nursing. SW followed up, patient reports he is not aware where this information came from. .  SW cont. To provide pt with Home Health, Home Care and Medicaid resources following up from 9/13 conversation.  Sw informed pt that a HH selection will need to be made before arranging therapy and wound care at home. Pt states he will not require the Home Care agency list, friends will be able to assist at home. Pt informed Home Health services are covered by insurance. Pt reports he will need to discuss [place] with his daughter before selection.Pt informed of current status with [facility], active until August 2023. Patient reports no other additional questions and concerns.   [place], [place]

## 2021-02-11 NOTE — Progress Notes (Addendum)
Patient ID: Joseph [name], male   DOB: April 19, 1946, 75 y.o.   MRN: [id number]  SW received VM from patient daughter requesting a returned phone call. Pt daughter requesting no Film/video editor resources. Sw informed pt and family that insurance does not cover this service. Insurance will cover an aide to provide ADL care. SW explained this to pt daughter. Sw awaiting pt recommendations, sw will follow up with family in reference to reccs once complete.   Daughter requesting to speak with supervisor. Dr. Riley Kill informed.   [place], [place] [phone]

## 2021-02-12 ENCOUNTER — Other Ambulatory Visit: Payer: Self-pay | Admitting: *Deleted

## 2021-02-12 DIAGNOSIS — N182 Chronic kidney disease, stage 2 (mild): Secondary | ICD-10-CM

## 2021-02-12 DIAGNOSIS — I10 Essential (primary) hypertension: Secondary | ICD-10-CM

## 2021-02-12 LAB — GLUCOSE, CAPILLARY
Glucose-Capillary: 115 mg/dL — ABNORMAL HIGH (ref 70–99)
Glucose-Capillary: 180 mg/dL — ABNORMAL HIGH (ref 70–99)
Glucose-Capillary: 107 mg/dL — ABNORMAL HIGH (ref 70–99)
Glucose-Capillary: 158 mg/dL — ABNORMAL HIGH (ref 70–99)

## 2021-02-12 NOTE — Progress Notes (Signed)
Patient ID: Joseph [name], male   DOB: 10-24-1945, 75 y.o.   MRN: [id number]  Pt accepted with [facility].  Wound Care, Aide and therapies. Pt informed. No additional questions or concerns, sw will continue to follow up  [place], [place] [phone]

## 2021-02-12 NOTE — Progress Notes (Signed)
Patient ID: Joseph [name], male   DOB: January 04, 1946, 75 y.o.   MRN: [id number]  SW discussed HH with pt. Pt made a HH selection of [facility]. SW will send pt referral and update pt of determination.  [place], [place] [phone]

## 2021-02-12 NOTE — Progress Notes (Signed)
Physical Therapy Session Note  Patient Details  Name: Joseph [name] MRN: [id number] Date of Birth: Oct 02, 1945  Today's Date: 02/12/2021 PT Individual Time: 7209-4709; 1030-1100 PT Individual Time Calculation (min): 75 min and 30 min  Short Term Goals: Week 1:  PT Short Term Goal 1 (Week 1): STG=LTG due to ELOS  Skilled Therapeutic Interventions/Progress Updates:  Session 1  Pt received sitting EOB, reported 3/10 pain in L residual limb, nursing present to administer pain meds. Pt donned R prosthetic and L limb guard at EOB w/ set-up assist. Lateral scoot transfer from EOB to WC on L side w/S*. Pt performed oral care and personal hygiene at sink independently. Pt self-propelled throughout session in WC mod I, up to 300'.   In // bars for improved LLE strength, R weightbearing tolerance and pre-prosthetic training, S* throughout:  -Sit <>stands x3  -Hip 3 way (flex/ext/abd) w/1.5# weight on LLE, BUE support on rails, 5s isometric hold and 5s eccentric contraction w/emphasis on controlled movement and isolation of glute, x15 per direction. Multimodal cues throughout for proper technique   -Progressed to hip 3-way w/blue theraband tied around upper quads to practice for HEP at home, x15 per side. Pt able to self-correct form, no cues provided.   Lateral scoot transfer from [facility] to mat w/S* on R side. Sit edge of mat <>prone independently and pt laid prone propped on elbows x10 minutes with intermittent prone glute kick backs for hip flexor stretch and imporved glute strength. Prone <>sit edge of mat independently and pt performed lateral scoot to L side to WC w/S*. Pt transported back to room w/total A 2/2 fatigue and was left seated in WC, denied pain, all needs in reach.    Session 2  Pt received seated in WC in room, denied pain and requested to go outside for session. Pt transported from room to reflection fountain w/total A for time management. Created HEP for pt based on exercises performed in  earlier session. Emphasis on safe set up at home, having solid counter in front of pt and locked WC or unmovable chair behind him while performing, pt verbalized understanding. Pt was transported back to room w/total A and was left seated in WC in room, handoff to OT, all needs in reach.   Therapy Documentation Precautions:  Precautions Precautions: Fall Precaution Comments: new L BKA, chronic R BKA with prosthetic, wound vac Restrictions Weight Bearing Restrictions: Yes RLE Weight Bearing: Non weight bearing LLE Weight Bearing: Non weight bearing   Therapy/Group: Individual Therapy Jill Alexanders Adebayo Ensminger, PT, DPT  02/12/2021, 7:42 AM

## 2021-02-12 NOTE — Progress Notes (Signed)
PROGRESS NOTE   Subjective/Complaints: Patient seen sitting up in his chair this morning, work with therapies.  He states he slept well overnight.  He is questions again regarding dressing changes at discharge.  ROS: Denies CP, SOB, N/V/D  Objective:   No results found. Recent Labs    02/10/21 0503  WBC 12.7*  HGB 10.0*  HCT 30.1*  PLT 443*     Recent Labs    02/10/21 0503  NA 133*  K 4.3  CL 97*  CO2 28  GLUCOSE 121*  BUN 31*  CREATININE 1.32*  CALCIUM 9.3      Intake/Output Summary (Last 24 hours) at 02/12/2021 1143 Last data filed at 02/12/2021 0734 Gross per 24 hour  Intake 720 ml  Output 1220 ml  Net -500 ml         Physical Exam: Vital Signs Blood pressure 118/82, pulse 64, temperature 98.3 F (36.8 C), resp. rate 16, height 6\' 3"  (1.905 m), weight 99.1 kg, SpO2 98 %. Constitutional: No distress . Vital signs reviewed. HENT: Normocephalic.  Atraumatic. Eyes: EOMI. No discharge. Cardiovascular: No JVD.  RRR. Respiratory: Normal effort.  No stridor.  Bilateral clear to auscultation. GI: Non-distended.  BS +. Skin: Warm and dry.  Left BKA with dressing CDI. Psych: Normal mood.  Normal behavior. Musc: No edema in extremities.  No tenderness in extremities. Neuro: Alert Motor: Bilateral upper extremities: 5/5 in B/L arms RLE- 5/5 in HF/KE, unchanged LLE-4+/5 hip flexion, knee extension, unchanged   Assessment/Plan: 1. Functional deficits which require 3+ hours per day of interdisciplinary therapy in a comprehensive inpatient rehab setting. Physiatrist is providing close team supervision and 24 hour management of active medical problems listed below. Physiatrist and rehab team continue to assess barriers to discharge/monitor patient progress toward functional and medical goals  Care Tool:  Bathing    Body parts bathed by patient: Right arm, Left arm, Chest, Abdomen, Front perineal area,  Buttocks, Right upper leg, Left upper leg, Face     Body parts n/a: Right lower leg, Left lower leg   Bathing assist Assist Level: Supervision/Verbal cueing (in sitting only)     Upper Body Dressing/Undressing Upper body dressing   What is the patient wearing?: Pull over shirt    Upper body assist Assist Level: Set up assist    Lower Body Dressing/Undressing Lower body dressing      What is the patient wearing?: Pants     Lower body assist Assist for lower body dressing: Supervision/Verbal cueing (in sitting and lateral lean)     Toileting Toileting    Toileting assist Assist for toileting: Moderate Assistance - Patient 50 - 74%     Transfers Chair/bed transfer  Transfers assist     Chair/bed transfer assist level: Supervision/Verbal cueing     Locomotion Ambulation   Ambulation assist      Assist level: Supervision/Verbal cueing Assistive device: Walker-rolling Max distance: 75 ft   Walk 10 feet activity   Assist     Assist level: Supervision/Verbal cueing Assistive device: Walker-rolling   Walk 50 feet activity   Assist Walk 50 feet with 2 turns activity did not occur: Safety/medical concerns  Assist level:  Supervision/Verbal cueing Assistive device: Walker-rolling    Walk 150 feet activity   Assist Walk 150 feet activity did not occur: Safety/medical concerns         Walk 10 feet on uneven surface  activity   Assist Walk 10 feet on uneven surfaces activity did not occur: Safety/medical concerns         Wheelchair     Assist Is the patient using a wheelchair?: Yes Type of Wheelchair: Manual    Wheelchair assist level: Independent Max wheelchair distance: >200 ft    Wheelchair 50 feet with 2 turns activity    Assist        Assist Level: Independent   Wheelchair 150 feet activity     Assist      Assist Level: Independent   Medical Problem List and Plan: 1.  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   Continue CIR 2.  Antithrombotics: -DVT/anticoagulation:  Pharmaceutical: Heparin             -antiplatelet therapy: Aspirin 81  mg daily 3. Pain Management: Oxycodone as needed 4. Mood: Provide emotional support             -antipsychotic agents: N/A 5. Neuropsych: This patient is capable of making decisions on his own behalf. 6. Skin/Wound Care:  DCed VAC on 9/14 7. Fluids/Electrolytes/Nutrition: Routine in and outs with follow-up chemistries 8.  Acute blood loss anemia.  Continue iron supplement 9.  Diabetes mellitus with peripheral neuropathy.  Hemoglobin A1c 9.4.  SSI. Basaglar 15 unit daily  CBG (last 3)  Recent Labs    02/11/21 2114 02/12/21 0633 02/12/21 1128  GLUCAP 151* 115* 180*     ?  Trending up on 9/16, will consider medication adjustments if persistent 10.  Hypertension.  Norvasc 10 mg daily.  Monitor with increased mobility  Controlled on 9/16 11.  Hyperlipidemia.  Lipitor 12.  CKD stage II.  Creatinine baseline 1.26-1.52.  Follow-up chemistries  Creatinine 1.32 on 9/14  Encourage fluids 13.  Diastolic congestive heart failure.  Monitor for any signs of fluid overload 14. Constipation Improving 15.  Leukocytosis  WBCs 12.7 on 9/14, improving  Afebrile, no signs/symptoms of infection  Continue to monitor   LOS: 8 days A FACE TO FACE EVALUATION WAS PERFORMED  Anjelo Pullman Karis Juba 02/12/2021, 11:43 AM

## 2021-02-12 NOTE — Patient Outreach (Signed)
[facility]) Care Management  02/12/2021  Joseph [name] 23-Jun-1945 [id number]   [facility] Care coordination-  [facility] multidisciplinary care discussion (MCD)   [facility] MCD team was sent a template report   [facility] RN CM reviewed Epic notes wound vac removed on 02/10/21, has a target discharge date of 02/15/21, He is not preferring HH, reports family to assist but will consult daughter [facility] RN CM reviewed and provided an update on patient status and progression to the [facility] MCD team [facility] RN CM answered questions for MCD team  Recommendations - no recommendations  [facility] RN CM will follow up with within the next 7-14 business days Routed note to MD   Cala Bradford L. [name], RN, BSN, CCM [facility] Telephonic Care Management Care Coordinator Office number [phone] Mobile number [phone]  Main [facility] number [phone] Fax number [phone]

## 2021-02-13 LAB — GLUCOSE, CAPILLARY
Glucose-Capillary: 139 mg/dL — ABNORMAL HIGH (ref 70–99)
Glucose-Capillary: 130 mg/dL — ABNORMAL HIGH (ref 70–99)
Glucose-Capillary: 144 mg/dL — ABNORMAL HIGH (ref 70–99)
Glucose-Capillary: 197 mg/dL — ABNORMAL HIGH (ref 70–99)

## 2021-02-13 NOTE — Progress Notes (Addendum)
Occupational Therapy Discharge Summary  Patient Details  Name: Joseph [name] MRN: [id number] Date of Birth: 1946/05/13   Patient has met 7 of 9 long term goals due to improved activity tolerance, improved balance, postural control, ability to compensate for deficits, improved awareness, and improved coordination.  Patient to discharge at overall Modified Independent level.  Patient's care partner is independent to provide the necessary physical assistance at discharge.  Pt is Mod I with BADLs, squat pivot transfers, and lateral scoot transfers. Note pt must have on R prosthetic. Pt has been educated on safety concerns with "walking" into bathroom at home while holding on to grab bars, recommended side stepping with RW if will not use BSC, pt somewhat receptive but states he has his "way of doing things" at home. Mod I from wheelchair level. Family and friends to assist PRN.  Pt still requires supervision for sit<stands and dynamic standing and often refuses to attempt standing during functional activity as he was used to seated level ADL PTA. Therefore standing goals were unable to be met.   Recommendation:  Patient will benefit from ongoing skilled OT services in home health setting to continue to advance functional skills in the area of BADL, iADL, and Reduce care partner burden.  Equipment: No equipment provided Pt has TTB and BSC   Reasons for discharge: treatment goals met and discharge from hospital  Patient/family agrees with progress made and goals achieved: Yes  OT Discharge Precautions/Restrictions  Precautions Precautions: Fall Precaution Comments: new L BKA, L limb guard donned when OOB, hx of R BKA with prosthetic, Restrictions Weight Bearing Restrictions: Yes RLE Weight Bearing: Weight bearing as tolerated LLE Weight Bearing: Non weight bearing Pain Pain Assessment Pain Scale: 0-10 Pain Score: 8  Pain Type: Acute pain;Surgical pain Pain Location: Leg Pain Orientation:  Left Pain Descriptors / Indicators: Aching Pain Frequency: Intermittent Pain Onset: On-going Pain Intervention(s): Medication (See eMAR) ADL ADL Eating: Independent Grooming: Independent Where Assessed-Grooming: Sitting at sink Upper Body Bathing: Modified independent Where Assessed-Upper Body Bathing: shower Lower Body Bathing: Modified independent Where Assessed-Lower Body Bathing: shower Upper Body Dressing: Modified independent (Device) Where Assessed-Upper Body Dressing: wheelchair Lower Body Dressing: Modified independent Where Assessed-Lower Body Dressing: wheelchair Toileting: Modified independent Where Assessed-Toileting: elevated toilet Toilet Transfer: Modified independent Toilet Transfer Method: Engineer, water: Bedside commode Vision Patient Visual Report: No change from baseline Vision Assessment?: No apparent visual deficits Perception  Perception: Within Functional Limits Praxis Praxis: Intact Cognition Overall Cognitive Status: Within Functional Limits for tasks assessed Arousal/Alertness: Awake/alert Memory: Appears intact Awareness: Appears intact Problem Solving: Appears intact Safety/Judgment: Appears intact Sensation Sensation Light Touch: Impaired by gross assessment Proprioception: Appears Intact Coordination Gross Motor Movements are Fluid and Coordinated: Yes Fine Motor Movements are Fluid and Coordinated: Yes Finger Nose Finger Test: [facility] Motor  Motor Motor: Within Functional Limits Motor - Skilled Clinical Observations: generalized weakness, mildly limited by recent L BKA Mobility  Transfers Sit to Stand: Independent with assistive device Stand to Sit: Independent with assistive device  Trunk/Postural Assessment  Cervical Assessment Cervical Assessment: Within Functional Limits Thoracic Assessment Thoracic Assessment: Within Functional Limits Lumbar Assessment Lumbar Assessment: Within Functional  Limits Postural Control Postural Control: Within Functional Limits  Balance Balance Balance Assessed: Yes Static Sitting Balance Static Sitting - Balance Support: No upper extremity supported Static Sitting - Level of Assistance: 6: Modified independent (Device/Increase time) Dynamic Sitting Balance Dynamic Sitting - Balance Support: No upper extremity supported Dynamic Sitting - Level of Assistance: 6: Modified  independent (Device/Increase time) Dynamic Sitting - Balance Activities: Lateral lean/weight shifting;Forward lean/weight shifting;Reaching for Consulting civil engineer Standing - Balance Support: Bilateral upper extremity supported Static Standing - Level of Assistance: 6: Modified independent (Device/Increase time) Dynamic Standing Balance Dynamic Standing - Balance Support: Bilateral upper extremity supported;During functional activity Dynamic Standing - Level of Assistance: 6: Modified independent (Device/Increase time) Extremity/Trunk Assessment RUE Assessment RUE Assessment: Within Functional Limits LUE Assessment LUE Assessment: Within Functional Limits   Lucila Maine 02/14/2021, 12:22 PM

## 2021-02-13 NOTE — Progress Notes (Signed)
Occupational Therapy Session Note  Patient Details  Name: Joseph [name] MRN: [id number] Date of Birth: 01/14/1946  Today's Date: 02/13/2021 OT Individual Time: 7628-3151 OT Individual Time Calculation (min): 60 min + 43 min   Short Term Goals: Week 1:  OT Short Term Goal 1 (Week 1): Pt will transfer to [facility] over toilet with RW with S. OT Short Term Goal 2 (Week 1): Pt will complete toileting with S.  Skilled Therapeutic Interventions/Progress Updates:    Session 1 (7616-0737): Pt received seated in w/c, denies pain, agreeable to therapy. Session focus on activity tolerance, BUE/LLE conditioning, functional transfers in prep for improved ADL/IADL/func mobility performance + decreased caregiver burden.  No questions/concerns prior to DC. Reviewed importance of regular skin checks, desensitization techniques, and pressure relief. Verbalized understanding and demonstrates sufficient recall from previous sessions on education.   Pt elects to focus on residual L limb strengthening. Self-propelled w/c to and from gym with mod I. Ind managed w/c parts and completed squat-pivot transfer with S to mat.   In side-lying, completed 2x10 hip extension, hip abduction, forward/backward circles, and writing each letter of the alphabet. Required several sec therapeutic rest break in between sets. In prone, completed 2x10 hip extensions, and one 15 sec hold and then 20 sec hold in same position. Finally, completed 2x10 modified forearm push-ups. Reviewed progressive muscle relaxation techniques as pt endorses he has "tight" upper back muscles.   Pt appreciate of opportunity to attend CIR and reports having learned a lot from previous stay and has remained active. Provided with updated amputee support group information in event pt is interested for peer mentoring opportunities.   Pt left seated in w/c with safety belt alarm engaged, call bell in reach, and all immediate needs met.    Session 2 [phone]): Pt  received seated in w/c, denies pain, agreeable to therapy. Session focus on self-care retraining, activity tolerance, func transfers, BUE/core strengthening, static standing balance, dynamic sitting balance in prep for improved ADL/IADL/func mobility performance + decreased caregiver burden. Self-propelled w/c mod I to and from therapy gym, ind with w/c part management.  Stood at [employer] for total of ~ 8 min to challenge reaching outside BOS, static standing balance, and endurance while completing the following games with a cognitive component:  -Single Target: 2 min , 100% accuracy  -Bell Cancellation: 5 min 22 secs, 2 misses  To challenge core strength and dynamic sitting balance in prep for improved dressing/bathing performance, completed 1x10 of the following with 2 kg medicine ball  -modified sit-ups  -chest press  -overhead reaches  -core twists  Pt left seated in w/c with safety belt alarm engaged, call bell in reach, and all immediate needs met.    Therapy Documentation Precautions:  Precautions Precautions: Fall Precaution Comments: new L BKA, chronic R BKA with prosthetic, wound vac Restrictions Weight Bearing Restrictions: Yes RLE Weight Bearing: Non weight bearing LLE Weight Bearing: Non weight bearing  Pain: see session notes ADL: See Care Tool for more details. Therapy/Group: Individual Therapy  Volanda Napoleon MS, OTR/L   02/13/2021, 6:47 AM

## 2021-02-13 NOTE — Progress Notes (Signed)
Physical Therapy Session Note  Patient Details  Name: Joseph [name] MRN: [id number] Date of Birth: [birth year]  Today's Date: 02/13/2021 PT Individual Time: 7989-2119 PT Individual Time Calculation (min): 41 min   Short Term Goals: Week 1:  PT Short Term Goal 1 (Week 1): STG=LTG due to ELOS  Skilled Therapeutic Interventions/Progress Updates:  Patient seated EOB on entrance to room. RN having just provided morning medications. Patient alert and agreeable to PT session.   Patient with no pain complaint throughout session. Only complaint of mild R knee soreness that he relates is from increased activity in standing.   Therapeutic Activity: Transfers: Pt dons RLE prosthetic with setup. Pointed out small open patch of skin on medial aspect of thigh which pt states he has seen. Patient performed squat pivot transfer to pt's L side bed --> w'c requiring two attempts/ scoots and close supervision. All sit<>stand and stand pivot transfers using RW and parallel bars throughout session performed with split UE positioning and supervision. Provided verbal cues for forward lean.  Gait Training:  Patient ambulated 25' x2 ft using RW with hop-to gait on RLE prosthetic. Demonstrated good balance and COM relationship to hands throughout. Gentle footstrike.   Wheelchair Mobility:  Patient propelled wheelchair >200 feet x2 with supervision. Pt also able to attached legrests and apply brakes when necessary with no cues required throughout session. Demonstrates ability to position w/c in room to park in order to reach all needs at end of session.   Therapeutic Exercise: Patient performed Hip 3 way (flex/ext/abd) w/Level 4 theraband on LLE, BUE support on rails, 5s isometric hold at max range and then 5s eccentric contraction w/emphasis on controlled movement and isolation of hip flexors, glute max, and glute med  x15 per direction. Multimodal cues throughout for proper technique    Patient seated  in w/c at end  of session with brakes locked, belt alarm set, tray table in front of pt, and all needs within reach.     Therapy Documentation Precautions:  Precautions Precautions: Fall Precaution Comments: new L BKA, L limb guard donned when OOB, hx of R BKA with prosthetic, Restrictions Weight Bearing Restrictions: Yes RLE Weight Bearing: Weight bearing as tolerated (FWB) LLE Weight Bearing: Non weight bearing General:   Pain: No pain complaint during session, but does complain of soreness at R knee. Addressed with change in positioning and change in WB.  Therapy/Group: Individual Therapy  Loel Dubonnet PT, DPT 02/13/2021, 7:49 AM

## 2021-02-13 NOTE — Progress Notes (Signed)
Occupational Therapy Session Note  Patient Details  Name: Montenegro Wagster MRN: [id number] Date of Birth: 1945-06-08  Today's Date: 02/14/2021 OT Individual Time: 6962-9528 OT Individual Time Calculation (min): 41 min    Skilled Therapeutic Interventions/Progress Updates:    Pt greeted in the w/c with no c/o pain. He was agreeable to tx. Pt participated in grad day events such as simulated+real bathing/dressing tasks, shower transfer (lateral scoot), and elevated toilet transfer (squat pivot). Pt performing these tasks, or exhibiting the full ability to complete stated tasks, at Mod I level. He reports independently completing oral care + grooming tasks in the room. Pt mindful of his need to cover his Lt residual limb in the shower. Has plans for family/friends to assist with pet care. Also plans to use his inspection mirror during daily routine for both residual limbs. Pt remained sitting up at close of session, all needs within reach.   Therapy Documentation Precautions:  Precautions Precautions: Fall Precaution Comments: new L BKA, L limb guard donned when OOB, hx of R BKA with prosthetic, Restrictions Weight Bearing Restrictions: Yes RLE Weight Bearing: Weight bearing as tolerated LLE Weight Bearing: Non weight bearing  Pain: Pain Assessment Pain Scale: 0-10 Pain Score: 6  Pain Type: Acute pain;Surgical pain Pain Location: Incision Pain Orientation: Left Pain Descriptors / Indicators: Sore Pain Frequency: Intermittent Pain Onset: On-going Pain Intervention(s): Medication (See eMAR) ADL: ADL Eating: Independent Grooming: Independent Where Assessed-Grooming: Sitting at sink Upper Body Bathing: Modified independent Where Assessed-Upper Body Bathing: Edge of bed Lower Body Bathing: Modified independent Where Assessed-Lower Body Bathing: Edge of bed Upper Body Dressing: Modified independent (Device) Where Assessed-Upper Body Dressing: Edge of bed Lower Body Dressing: Modified  independent Where Assessed-Lower Body Dressing: Edge of bed Toileting: Modified independent Where Assessed-Toileting: Bedside Commode Toilet Transfer: Modified independent Toilet Transfer Method: Animal nutritionist Equipment: Bedside commode    Therapy/Group: Individual Therapy  Darril Patriarca A Romona Murdy 02/14/2021, 3:49 PM

## 2021-02-14 LAB — GLUCOSE, CAPILLARY
Glucose-Capillary: 91 mg/dL (ref 70–99)
Glucose-Capillary: 153 mg/dL — ABNORMAL HIGH (ref 70–99)
Glucose-Capillary: 103 mg/dL — ABNORMAL HIGH (ref 70–99)
Glucose-Capillary: 196 mg/dL — ABNORMAL HIGH (ref 70–99)

## 2021-02-14 NOTE — Discharge Summary (Signed)
Physician Discharge Summary  Patient ID: Montenegro Diprima MRN: [id number] DOB/AGE: 1945-06-11 75 y.o.  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 FINDINGS: The cardiomediastinal silhouette is within normal limits. No pleural effusion. No pneumothorax. No mass or consolidation. No acute osseous abnormality. IMPRESSION: No active cardiopulmonary disease. Electronically Signed   By: Olive Bass M.D.   On: 01/26/2021 08:56   PERIPHERAL VASCULAR CATHETERIZATION  Result Date: 01/28/2021 DATE OF SERVICE: 01/27/2021  PATIENT:  Montenegro Cheadle  75 y.o. male  PRE-OPERATIVE DIAGNOSIS:  Atherosclerosis of native arteries of left lower extremity causing ulceration  POST-OPERATIVE DIAGNOSIS:  Same  PROCEDURE:  1) US guided right common femoral artery access 2) Aortogram 3) Left lower extremity angiogram with second order cannulation (56mL total contrast) 4) Conscious sedation (26 minutes)  SURGEON:  Rande Brunt. Lenell Antu, MD  ASSISTANT: none  ANESTHESIA:   local and IV sedation  ESTIMATED BLOOD LOSS: min  LOCAL MEDICATIONS USED:  LIDOCAINE  COUNTS: confirmed correct.  PATIENT DISPOSITION:  PACU - hemodynamically stable.  Delay start of Pharmacological VTE agent (>24hrs) due to surgical blood loss or risk of bleeding: no  INDICATION FOR PROCEDURE: Montenegro Keech is a 75 y.o. male with ulceration of the left great toe. After careful discussion of risks, benefits, and alternatives the patient was offered  angiogram. The patient understood and wished to proceed.  OPERATIVE FINDINGS: Terminal aorta and iliac arteries: Widely patent without flow limiting stenosis  Left lower extremity: Common femoral artery: Widely patent without flow limiting stenosis Profunda femoris artery: Widely patent without flow limiting stenosis Superficial femoral artery: Widely patent without flow limiting stenosis Popliteal artery: Widely patent without flow limiting stenosis Anterior tibial artery: occluded Tibioperoneal trunk: heavily diseased, but patent Peroneal artery: heavily diseased. Only named vessel. Tapers to occlusion in mid calf Posterior tibial artery: occluded Pedal circulation: reconstitution of anterior tibial artery at the foot  GLASS score. Grade 3: expect 53% 1 year re-intervention rate; expect 69% 5 year re-intervention rate; expect 49% 5 year mortality; expect 61% rate of restenosis from open operation at 5 years; expect 83% rate of restenosis from endovascular procedure. P 1.  WIfI score. Stage 4: expected 50% risk of major amputation within the year; revascularization may not reduce risk of amputation.  DESCRIPTION OF PROCEDURE: After identification of the patient in the pre-operative holding area, the patient was transferred to the operating room. The patient was positioned supine on the operating room table. Anesthesia was induced. The groins was prepped and draped in standard fashion. A surgical pause was performed confirming correct patient, procedure, and operative location.  The right groin was anesthetized with subcutaneous injection of 1% lidocaine. Using ultrasound guidance, the right common femoral artery was accessed with micropuncture technique. Fluoroscopy was used to confirm cannulation over the femoral head. The 36F sheath was upsized to 78F.  A Benson wire was advanced into the distal aorta. Over the wire an omni flush catheter was advanced to the level of L2. Aortogram was performed - see above for  details.  The left  common iliac artery was selected with a Benson guidewire. The wire was advanced into the common femoral artery. Over the wire the omni flush catheter was advanced into the external iliac artery. Selective angiography was performed - see above for details.  The sheath was left in place to be pulled in the recovery area.  Conscious sedation was administered with the use of IV fentanyl and midazolam under continuous physician and nurse monitoring.  Heart rate, blood pressure, and oxygen saturation were continuously monitored.  Total sedation time was 26 minutes  Upon completion of the case instrument and sharps counts were confirmed correct. The patient was transferred to the PACU in good condition. I was present for all portions of the procedure.  PLAN: No good options for limb salvage. A pedal bypass may be technically achievable. I think his ischemia / tissue loss may be too severe. High risk for amputation.  Rande Brunt. Lenell Antu, MD [facility] Phone Number: [phone] 01/27/2021 9:18 AM   DG Foot 2 Views Left  Result Date: 01/26/2021 CLINICAL DATA:  Toe infection. EXAM: LEFT FOOT - 2 VIEW COMPARISON:  12/08/2020 FINDINGS: Soft tissue wound identified medial aspect of the great toe. Since 12/08/2020, the tuft of the distal phalanx of the great toe has eroded, suggesting osteomyelitis. There is cortical disruption at the medial base of the great toe distal phalanx. Gas is identified in the soft tissues between the first and second toes. Degenerative changes noted first MTP joint. 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 Electronically Signed   By: Kennith Center M.D.   On: 01/26/2021 08:55   DG Toe Great Left  Result Date: 01/30/2021 CLINICAL DATA:  Recent osteomyelitis, worsening wound for 1 week in the plantar left first toe EXAM: LEFT  GREAT TOE COMPARISON:  01/26/2021 left foot radiographs FINDINGS: Generalized soft tissue swelling. Soft tissue defect in plantar/medial distal left first toe. Loss of cortex compatible with erosive change throughout the medial/plantar aspect of the distal phalanx left first toe, similar to slightly worsened. New mild erosive changes in the medial distal aspect of the proximal phalanx in the left first toe. No focal osseous lesions. No fracture. No dislocation. No radiopaque foreign bodies. IMPRESSION: Generalized soft tissue swelling. Soft tissue defect in the plantar/medial distal left first toe. Loss of cortex compatible with erosive change throughout the medial/plantar aspect of the distal phalanx left first toe, similar to slightly worsened. New mild erosive changes in the medial distal aspect of the proximal phalanx in the left first toe. These findings are compatible with progression of acute osteomyelitis. Electronically Signed   By: Delbert Phenix M.D.   On: 01/30/2021 06:36   VAS [place] ABI WITH/WO TBI  Result Date: 01/26/2021  LOWER EXTREMITY DOPPLER STUDY Patient Name:  Montenegro Wurzer  Date of Exam:   01/26/2021 Medical Rec #: [id number]    Accession #:    [id number] Date of Birth: [birth year]    Patient Gender: M Patient Age:   53 years Exam Location:  [facility] Procedure:      VAS [place] ABI WITH/WO TBI Referring Phys: Fayrene Helper --------------------------------------------------------------------------------  Indications: Ulceration, and peripheral artery disease. High Risk Factors: Hypertension, Diabetes. Other Factors: Right BKA.  Limitations: Today's exam was limited due to an open wound. Comparison Study: 11/14/2018 ABI- right noncompressible, dampened monophasic  waveforms, left falsely elevated with dampened monophasic                   waveforms. Performing Technologist: Gertie Fey MHA, RVT, RDCS, RDMS  Examination Guidelines: A complete evaluation includes at minimum,  Doppler waveform signals and systolic blood pressure reading at the level of bilateral brachial, anterior tibial, and posterior tibial arteries, when vessel segments are accessible. Bilateral testing is considered an integral part of a complete examination. Photoelectric Plethysmograph (PPG) waveforms and toe systolic pressure readings are included as required and additional duplex testing as needed. Limited examinations for reoccurring indications may be performed as noted.  ABI Findings: +--------+------------------+-----+---------+--------+ Right   Rt Pressure (mmHg)IndexWaveform Comment  +--------+------------------+-----+---------+--------+ [id number]                    triphasic         +--------+------------------+-----+---------+--------+ PTA                                     BKA      +--------+------------------+-----+---------+--------+ DP                                      BKA      +--------+------------------+-----+---------+--------+ +--------+------------------+-----+-------------------+-------+ Left    Lt Pressure (mmHg)IndexWaveform           Comment +--------+------------------+-----+-------------------+-------+ [id number]                    triphasic                  +--------+------------------+-----+-------------------+-------+ PTA                            absent                     +--------+------------------+-----+-------------------+-------+ DP      32                0.20 dampened monophasic        +--------+------------------+-----+-------------------+-------+ +-------+-----------+-----------+------------+------------+ ABI/TBIToday's ABIToday's TBIPrevious ABIPrevious TBI +-------+-----------+-----------+------------+------------+ Right  BKA        BKA                                 +-------+-----------+-----------+------------+------------+ Left   0.20       Wound                                +-------+-----------+-----------+------------+------------+  Summary:  *See table(s) above for measurements and observations.  Electronically signed by Waverly Ferrari MD on 01/26/2021 at 5:44:59 PM.    Final     Labs:  Basic Metabolic Panel: Recent Labs  Lab 02/10/21 0503  NA 133*  K 4.3  CL 97*  CO2 28  GLUCOSE 121*  BUN 31*  CREATININE 1.32*  CALCIUM 9.3    CBC: Recent Labs  Lab 02/10/21 0503  WBC 12.7*  NEUTROABS 6.6  HGB 10.0*  HCT 30.1*  MCV 89.6  PLT 443*    CBG: Recent Labs  Lab 02/13/21 2115 02/14/21 0618 02/14/21 1138 02/14/21 1626 02/14/21 2037  GLUCAP 197* 91 153* 103* 196*   Family history.  Father with diabetes mellitus.  Denies any colon cancer esophageal cancer or rectal cancer  Brief HPI:   Montenegro Poarch is a 75 y.o. male right-handed with history of diabetes mellitus diastolic congestive heart failure CKD stage II right BKA 11/16/2018 receiving inpatient rehab services.  Per chart review lives alone.  Independent with assistive device using prosthesis.  Presented 01/30/2021 with osteomyelitis of the left great toe followed by Dr. Lajoyce Corners with essentially no circulation from the ankle distally.  His dorsalis pedis ABI was 0.2 posterior tibial ABI 0.  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 Lajoyce Corners.  Wound VAC as directed.  Hospital course acute blood loss anemia 9.3 leukocytosis 16,400 felt to be reactive after surgery.  Noted history of CKD stage II creatinine 1.30.  Subcutaneous heparin for DVT prophylaxis.  Therapy evaluations completed due to patient decreased functional mobility was admitted for a comprehensive rehab program.   Hospital Course: Montenegro Cubillos was admitted to rehab 02/04/2021 for inpatient therapies to consist of PT, ST and OT at least three hours five days a week. Past admission physiatrist, therapy team and rehab RN have worked together to provide customized collaborative inpatient rehab.   Pertain to patient's left BKA 02/03/2021 as well as history of right BKA 11/16/2018.  Patient progressing nicely in therapies was using a right prosthesis.  Surgical site healing nicely wound VAC at been discontinued followed by Dr. Lajoyce Corners.  Subcutaneous heparin for DVT prophylaxis no bleeding episodes he also remained on low-dose aspirin.  Pain managed with use of oxycodone.  Blood sugars overall controlled hemoglobin A1c 9.4 insulin therapy as directed with full diabetic teaching.  Blood pressures monitored on Norvasc and would follow with PCP.  Lipitor for hyperlipidemia.  CKD stage II creatinine baseline 1.26-1.52.  He exhibited no signs of fluid overload.  Bouts of constipation resolved with laxative assistance.   Blood pressures were monitored on TID basis and controlled  Diabetes has been monitored with ac/hs CBG checks and SSI was use prn for tighter BS control.    Rehab course: During patient's stay in rehab weekly team conferences were held to monitor patient's progress, set goals and discuss barriers to discharge. At admission, patient required minimal assist 10 feet rolling walker moderate assist sit to stand  Physical exam.  Blood pressure 110/62 pulse 64 temperature 99.1 respirations 15 oxygen saturations 97% room air Constitutional.  No acute distress HEENT Head.  Normocephalic and atraumatic Eyes.  Pupils round and reactive to light no discharge.nystagmus Neck.  Supple nontender no JVD without thyromegaly Cardiac regular rate rhythm not extra sounds or murmur heard Abdomen.  Soft nontender positive bowel sounds not rebound Respiratory effort normal no respiratory distress without wheeze Musculoskeletal.  Normal range of motion Comments.  Upper extremities very strong 5/5 bilaterally Right lower extremity 5/5 hip flexors knee extension knee flexion and wound VAC in place Skin.  Right BKA well-healed.  Left BKA wound VAC. Neurologic.  Alert oriented x3  He/She  has had improvement  in activity tolerance, balance, postural control as well as ability to compensate for deficits. He/She has had improvement in functional use RUE/LUE  and RLE/LLE as well as improvement in awareness.  Patient dons right lower extremity prosthesis with set up.  Sit to stand stand pivot transfers rolling walker parallel bars supervision.  Ambulates 25 feet x 2 rolling walker with prosthesis.  Self propels wheelchair modified independent.  Dynamic sitting balance in preparation for improved ADLs functional mobility  supervision.  Full family teaching completed plan discharged to home       Disposition: Discharged home    Diet: Diabetic diet  Special Instructions: No driving smoking or alcohol  Cleanse incision site daily antibacterial/Dial soap  Medications at discharge. 1.  Tylenol as needed 2.  Norvasc 10 mg p.o. daily 3.  Aspirin 81 mg p.o. daily 4.  Lipitor 10 mg p.o. daily 5.  Ferrous sulfate 325 mg p.o. daily 6.  Basaglar 15 units daily 7.  Multivitamin daily 8.  Oxycodone 5 mg every 4 hours as needed 9.  Zinc sulfate 220 mg p.o. daily  30-35 minutes were spent completing discharge summary and discharge planning  Discharge Instructions     Ambulatory referral to Physical Medicine Rehab   Complete by: As directed    Moderate complexity follow-up 1 to 2 weeks bilateral BKA        Follow-up Information     [name], MD Follow up.   Specialty: Orthopedic Surgery Why: Call for appointment Contact information: [address] [place] [address] [phone]         Genice Rouge, MD Follow up.   Specialty: Physical Medicine and Rehabilitation Why: Office to call for appointment Contact information: [address] [place] [address] [phone]                 Signed: Charlton Amor 02/15/2021, 5:28 AM

## 2021-02-14 NOTE — Progress Notes (Signed)
Physical Therapy Discharge Summary  Patient Details  Name: Joseph [name] MRN: [id number] Date of Birth: 1945-10-07  Today's Date: 02/14/2021 PT Individual Time: 0830 - 0915, 45 min      Patient has met 9 of 9 long term goals due to improved activity tolerance, improved balance, improved postural control, increased strength, increased range of motion, decreased pain, and ability to compensate for deficits.  Patient to discharge at an ambulatory level Supervision, indoors, and modified independent indoors in wc, supervision outdoors in wc.    Patient's care partner unavailable .  Reasons goals not met: n/a  Recommendation:  Patient will benefit from ongoing skilled PT services in home health setting to continue to advance safe functional mobility, address ongoing impairments in HEP, dynamic balance in standing, gait training, pre-prosthetic training, and minimize fall risk.  Equipment: No equipment provided pt owns RW and wc  Reasons for discharge: treatment goals met and discharge from hospital  Patient/family agrees with progress made and goals achieved: Yes  PT Discharge  Tx today: pt resting in bed.  He denied pain. He sat EOB and donned R prosthesis, adding 2 socks appropriately for secure fit.  He donned limb protector LLE. Pt performed squat pivot transfers modified independent, simulated car transfer to sedan- height -seat with set-up, managed legrests on/off wc independently. Sitting edge of mat, he performed 20 x 1 bil scapular retraction.  He declined attempting prone lying for stretching L hip flexors.  PT educated pt on need to perform prone lying, and work up to 15 min BID. He expressed understanding. He propelled wc up/down ramp, pushing with bil hands, supervision. Pt reported that his personal wc does have anti-tippers.  Gait training with RW over level tile, x 75' including 3 turns, supervision.  During gait training, pt's limb guard fell off; pt able to problem-solve how to to  continue walking to sit down, then return in wc to pick up limb guard.  Pt re-donned it.  At end of session, pt seated in wc with needs at hand and seat belt alarm set. Precautions/Restrictions Precautions Precautions: Fall Precaution Comments: new L BKA, L limb guard donned when OOB, hx of R BKA with prosthetic, Restrictions Weight Bearing Restrictions: Yes LLE Weight Bearing: Non weight bearing Vital Signs Therapy Vitals Temp: 98.6 F (37 C) Temp Source: Oral Pulse Rate: 66 Resp: 14 BP: 118/73 Patient Position (if appropriate): Sitting Oxygen Therapy SpO2: 100 % O2 Device: Room Air Pain Pain Assessment Pain Score: 0-No pain Pain Interference Pain Interference Pain Effect on Sleep: 1. Rarely or not at all Pain Interference with Therapy Activities: 1. Rarely or not at all Pain Interference with Day-to-Day Activities: 1. Rarely or not at all Vision/Perception  Vision - History Ability to See in Adequate Light: 0 Adequate  Cognition Overall Cognitive Status: Within Functional Limits for tasks assessed Arousal/Alertness: Awake/alert Memory: Appears intact Awareness: Appears intact Problem Solving: Appears intact Safety/Judgment: Appears intact Sensation Sensation Light Touch: Impaired by gross assessment Proprioception: Appears Intact Coordination Gross Motor Movements are Fluid and Coordinated: Yes Fine Motor Movements are Fluid and Coordinated: Yes Finger Nose Finger Test: [facility] Motor  Motor Motor: Within Functional Limits Motor - Discharge Observations: generalized weakness, mildly limited by recent L BKA  Mobility Bed Mobility Bed Mobility: Rolling Right;Rolling Left;Supine to Sit;Sit to Supine Rolling Right: Independent Rolling Left: Independent Supine to Sit: Independent Sit to Supine: Independent Transfers Transfers: Sit to Stand;Stand to Sit;Stand Pivot Transfers;Squat Pivot Transfers Sit to Stand: Supervision/Verbal cueing (R prosthesis) Stand  to Sit:  Supervision/Verbal cueing Squat Pivot Transfers: Independent Transfer (Assistive device): Other (Comment) (R prosthesis) Locomotion  Gait Ambulation: Yes Gait Assistance: Supervision/Verbal cueing Gait Distance (Feet): 75 Feet Assistive device: Rolling walker (R prosthesis) Gait Gait: Yes Gait Pattern: Within Functional Limits Gait Pattern: Trunk flexed;Step-to pattern Gait velocity: decreased Stairs / Additional Locomotion Stairs: No Wheelchair Mobility Wheelchair Mobility: Yes Wheelchair Assistance: Independent with Camera operator: Both upper extremities Wheelchair Parts Management: Independent Distance: 200  Trunk/Postural Assessment  Cervical Assessment Cervical Assessment: Within Functional Limits Thoracic Assessment Thoracic Assessment: Within Functional Limits Lumbar Assessment Lumbar Assessment: Within Functional Limits Postural Control Postural Control: Within Functional Limits  Balance Balance Balance Assessed: Yes Static Sitting Balance Static Sitting - Balance Support: No upper extremity supported Static Sitting - Level of Assistance: 6: Modified independent (Device/Increase time) Dynamic Sitting Balance Dynamic Sitting - Balance Support: No upper extremity supported Dynamic Sitting - Level of Assistance: 6: Modified independent (Device/Increase time) Dynamic Sitting - Balance Activities: Lateral lean/weight shifting;Forward lean/weight shifting;Reaching for Consulting civil engineer Standing - Balance Support: Bilateral upper extremity supported Static Standing - Level of Assistance: 6: Modified independent (Device/Increase time) Dynamic Standing Balance Dynamic Standing - Balance Support: Bilateral upper extremity supported;During functional activity Dynamic Standing - Level of Assistance: 6: Modified independent (Device/Increase time) Extremity Assessment      RLE Assessment RLE Assessment: Exceptions to  [facility] General Strength Comments: hx of R BKA in 2020. hip/knee grossly 4+/5 to 5/5 LLE Assessment LLE Assessment: Exceptions to [facility] Active Range of Motion (AROM) Comments: grossly WFL for knee extension. 105 Deg flexion General Strength Comments: grossly in sitting: hip flexion,abduction, adduction 4+/5;  knee flexion/extension 4/5 , pain free    Joseph [name] 02/14/2021, 5:42 PM

## 2021-02-14 NOTE — Progress Notes (Signed)
PROGRESS NOTE   Subjective/Complaints: Patient seen sitting up in bed this AM.  He states he slept well overnight.  He has has questions regarding discharge meds, follow up meds, and dressing changes again.   ROS: Denies CP, SOB, N/V/D  Objective:   No results found. No results for input(s): WBC, HGB, HCT, PLT in the last 72 hours.   No results for input(s): NA, K, CL, CO2, GLUCOSE, BUN, CREATININE, CALCIUM in the last 72 hours.    Intake/Output Summary (Last 24 hours) at 02/14/2021 1350 Last data filed at 02/14/2021 3536 Gross per 24 hour  Intake 240 ml  Output 1275 ml  Net -1035 ml         Physical Exam: Vital Signs Blood pressure (!) 143/69, pulse 67, temperature 98.1 F (36.7 C), temperature source Oral, resp. rate 18, height 6\' 3"  (1.905 m), weight 99.1 kg, SpO2 100 %. Constitutional: No distress . Vital signs reviewed. HENT: Normocephalic.  Atraumatic. Eyes: EOMI. No discharge. Cardiovascular: No JVD.  RRR. Respiratory: Normal effort.  No stridor.  Bilateral clear to auscultation. GI: Non-distended.  BS +. Skin: Warm and dry.  Left BKA with dressing CDI. Psych: Normal mood.  Normal behavior. Musc: No edema in extremities.  No tenderness in extremities. Neuro: Alert Motor: Bilateral upper extremities: 5/5 in B/L arms RLE: 5/5 in HF/KE, unchanged LLE: 4+/5 hip flexion, knee extension, stable   Assessment/Plan: 1. Functional deficits which require 3+ hours per day of interdisciplinary therapy in a comprehensive inpatient rehab setting. Physiatrist is providing close team supervision and 24 hour management of active medical problems listed below. Physiatrist and rehab team continue to assess barriers to discharge/monitor patient progress toward functional and medical goals  Care Tool:  Bathing    Body parts bathed by patient: Right arm, Left arm, Chest, Abdomen, Front perineal area, Buttocks, Right upper  leg, Left upper leg, Face     Body parts n/a: Right lower leg, Left lower leg   Bathing assist Assist Level: Supervision/Verbal cueing (in sitting only)     Upper Body Dressing/Undressing Upper body dressing   What is the patient wearing?: Pull over shirt    Upper body assist Assist Level: Set up assist    Lower Body Dressing/Undressing Lower body dressing      What is the patient wearing?: Pants     Lower body assist Assist for lower body dressing: Supervision/Verbal cueing (in sitting and lateral lean)     Toileting Toileting    Toileting assist Assist for toileting: Moderate Assistance - Patient 50 - 74%     Transfers Chair/bed transfer  Transfers assist     Chair/bed transfer assist level: Independent with assistive device Chair/bed transfer assistive device: assist      Assist level: Supervision/Verbal cueing Assistive device: Walker-rolling (r prosthesis) Max distance: 75   Walk 10 feet activity   Assist     Assist level: Supervision/Verbal cueing Assistive device: Walker-rolling   Walk 50 feet activity   Assist Walk 50 feet with 2 turns activity did not occur: Safety/medical concerns  Assist level: Supervision/Verbal cueing Assistive device: Walker-rolling    Walk 150 feet  activity   Assist Walk 150 feet activity did not occur: Safety/medical concerns         Walk 10 feet on uneven surface  activity   Assist Walk 10 feet on uneven surfaces activity did not occur: Safety/medical concerns         Wheelchair     Assist Is the patient using a wheelchair?: Yes Type of Wheelchair: Manual    Wheelchair assist level: Independent Max wheelchair distance: 200    Wheelchair 50 feet with 2 turns activity    Assist        Assist Level: Independent   Wheelchair 150 feet activity     Assist      Assist Level: Independent   Medical Problem List and Plan: 1.   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   Continue CIR, pt and family and edu 2.  Antithrombotics: -DVT/anticoagulation:  Pharmaceutical: Heparin             -antiplatelet therapy: Aspirin 81  mg daily 3. Pain Management: Oxycodone as needed 4. Mood: Provide emotional support             -antipsychotic agents: N/A 5. Neuropsych: This patient is capable of making decisions on his own behalf. 6. Skin/Wound Care:  DCed VAC on 9/14 7. Fluids/Electrolytes/Nutrition: Routine in and outs with follow-up chemistries 8.  Acute blood loss anemia.  Continue iron supplement 9.  Diabetes mellitus with peripheral neuropathy.  Hemoglobin A1c 9.4.  SSI. Basaglar 15 unit daily  CBG (last 3)  Recent Labs    02/13/21 2115 02/14/21 0618 02/14/21 1138  GLUCAP 197* 91 153*     Slightly labile on 9/18, monitor for trend 10.  Hypertension.  Norvasc 10 mg daily.  Monitor with increased mobility  Elevated this AM, otherwise controlled 11.  Hyperlipidemia.  Lipitor 12.  CKD stage II.  Creatinine baseline 1.26-1.52.  Follow-up chemistries  Creatinine 1.32 on 9/14  Encourage fluids 13.  Diastolic congestive heart failure.  Monitor for any signs of fluid overload 14. Constipation Improving 15.  Leukocytosis  WBCs 12.7 on 9/14, improving  Afebrile, no signs/symptoms of infection  Continue to monitor   LOS: 10 days A FACE TO FACE EVALUATION WAS PERFORMED  Joseph Joseph [name] 02/14/2021, 1:50 PM

## 2021-02-14 NOTE — Progress Notes (Signed)
Slept good. PRN oxy ir 10mg 's given at 2103 & 0611.  Denies phantom pain. Last charted BM, 09/13. Patient declined PRN LOC. "I'll take it after my last therapy on Sunday." Limb guard in place to left BKA. Thursday A

## 2021-02-15 ENCOUNTER — Other Ambulatory Visit: Payer: Self-pay

## 2021-02-15 ENCOUNTER — Other Ambulatory Visit (HOSPITAL_COMMUNITY): Payer: Self-pay

## 2021-02-15 LAB — GLUCOSE, CAPILLARY: Glucose-Capillary: 103 mg/dL — ABNORMAL HIGH (ref 70–99)

## 2021-02-15 MED ORDER — INSULIN PEN NEEDLE 32G X 4 MM MISC
1.0000 | Freq: Every day | 1 refills | Status: DC
  Filled 2021-02-15 – 2021-06-16 (×2): qty 100, 90d supply, fill #0

## 2021-02-15 MED ORDER — ZINC SULFATE 220 (50 ZN) MG PO TABS
220.0000 mg | ORAL_TABLET | Freq: Every day | ORAL | 0 refills | Status: DC
  Filled 2021-02-15: qty 30, 30d supply, fill #0

## 2021-02-15 MED ORDER — ASPIRIN 81 MG PO TBEC: 81.0000 mg | DELAYED_RELEASE_TABLET | Freq: Every day | ORAL | 11 refills | Status: AC

## 2021-02-15 MED ORDER — BASAGLAR KWIKPEN 100 UNIT/ML ~~LOC~~ SOPN
15.0000 [IU] | PEN_INJECTOR | Freq: Every day | SUBCUTANEOUS | 3 refills | Status: DC
  Filled 2021-02-15: qty 6, 40d supply, fill #0
  Filled 2021-03-19: qty 6, 40d supply, fill #1

## 2021-02-15 MED ORDER — DICLOFENAC SODIUM 1 % EX GEL
2.0000 g | Freq: Three times a day (TID) | CUTANEOUS | 1 refills | Status: DC
  Filled 2021-02-15: qty 200, 33d supply, fill #0

## 2021-02-15 MED ORDER — ACCU-CHEK SOFTCLIX LANCETS MISC
0 refills | Status: DC
Start: 2021-02-15 — End: 2022-03-22
  Filled 2021-02-15: qty 100, 30d supply, fill #0

## 2021-02-15 MED ORDER — AMLODIPINE BESYLATE 10 MG PO TABS
10.0000 mg | ORAL_TABLET | Freq: Every day | ORAL | 0 refills | Status: DC
  Filled 2021-02-15: qty 30, 30d supply, fill #0

## 2021-02-15 MED ORDER — ATORVASTATIN CALCIUM 10 MG PO TABS
10.0000 mg | ORAL_TABLET | Freq: Every day | ORAL | 0 refills | Status: DC
  Filled 2021-02-15: qty 30, 30d supply, fill #0

## 2021-02-15 MED ORDER — OXYCODONE HCL 5 MG PO TABS
5.0000 mg | ORAL_TABLET | ORAL | 0 refills | Status: DC | PRN
  Filled 2021-02-15: qty 30, 5d supply, fill #0

## 2021-02-15 MED ORDER — ACCU-CHEK GUIDE VI STRP
ORAL_STRIP | 0 refills | Status: DC
  Filled 2021-02-15: qty 100, 33d supply, fill #0

## 2021-02-15 MED ORDER — ASCORBIC ACID 1000 MG PO TABS
1000.0000 mg | ORAL_TABLET | Freq: Every day | ORAL | 0 refills | Status: DC
  Filled 2021-02-15: qty 30, 30d supply, fill #0

## 2021-02-15 MED ORDER — FERROUS SULFATE 325 (65 FE) MG PO TABS
325.0000 mg | ORAL_TABLET | Freq: Every day | ORAL | 0 refills | Status: DC
  Filled 2021-02-15: qty 30, 30d supply, fill #0

## 2021-02-15 MED ORDER — DICLOFENAC SODIUM 1 % EX GEL
2.0000 g | Freq: Three times a day (TID) | CUTANEOUS | 1 refills | Status: DC
  Filled 2021-02-15: qty 200, 30d supply, fill #0

## 2021-02-15 MED ORDER — ACCU-CHEK SOFTCLIX LANCETS MISC
0 refills | Status: DC
  Filled 2021-02-15: qty 100, 33d supply, fill #0

## 2021-02-15 MED ORDER — INSULIN PEN NEEDLE 32G X 4 MM MISC
1.0000 | Freq: Every day | 1 refills | Status: DC
  Filled 2021-02-15: qty 100, 100d supply, fill #0

## 2021-02-15 MED ORDER — ACCU-CHEK GUIDE VI STRP
ORAL_STRIP | 0 refills | Status: DC
  Filled 2021-02-15: qty 100, 25d supply, fill #0

## 2021-02-15 MED ORDER — BASAGLAR KWIKPEN 100 UNIT/ML ~~LOC~~ SOPN
15.0000 [IU] | PEN_INJECTOR | Freq: Every day | SUBCUTANEOUS | 3 refills | Status: DC
  Filled 2021-02-15: qty 6, 40d supply, fill #0

## 2021-02-15 NOTE — Progress Notes (Signed)
Patient slept good, excited about going home. PRN oxy 10mg  given at 2119 & 0612. Usually rates Pain 8. 0613 A

## 2021-02-15 NOTE — Progress Notes (Signed)
PROGRESS NOTE   Subjective/Complaints:  No issues overnite , looking forward to d/c No stump or phantom pain  RN did dressing change this am , no drainage"looked good"  per pt   ROS: Denies CP, SOB, N/V/D  Objective:   No results found. No results for input(s): WBC, HGB, HCT, PLT in the last 72 hours.   No results for input(s): NA, K, CL, CO2, GLUCOSE, BUN, CREATININE, CALCIUM in the last 72 hours.    Intake/Output Summary (Last 24 hours) at 02/15/2021 0928 Last data filed at 02/15/2021 0814 Gross per 24 hour  Intake 600 ml  Output 1250 ml  Net -650 ml         Physical Exam: Vital Signs Blood pressure 130/83, pulse 76, temperature 98.2 F (36.8 C), temperature source Oral, resp. rate 17, height 6\' 3"  (1.905 m), weight 99.1 kg, SpO2 98 %.  General: No acute distress Mood and affect are appropriate Heart: Regular rate and rhythm no rubs murmurs or extra sounds Lungs: Clear to auscultation, breathing unlabored, no rales or wheezes Abdomen: Positive bowel sounds, soft nontender to palpation, nondistended Extremities: No clubbing, cyanosis, or edema   Skin: Warm and dry.  Left BKA with limb guard, RLE with BK prosthesis Psych: Normal mood.  Normal behavior. Musc: No edema in extremities.  No tenderness in extremities. Neuro: Alert Motor: Bilateral upper extremities: 5/5 in B/L arms RLE: 5/5 in HF/KE, unchanged LLE: 4+/5 hip flexion, knee extension, stable   Assessment/Plan: 1. Functional deficits  Stable for D/C today F/u PCP in 3-4 weeks F/u Dr 2 weeks F/u Dr Lajoyce Corners See D/C summary See D/C instructions  Care Tool:  Bathing    Body parts bathed by patient: Right arm, Left arm, Chest, Abdomen, Front perineal area, Buttocks, Right upper leg, Left upper leg, Face     Body parts n/a: Right lower leg, Left lower leg   Bathing assist Assist Level: Independent with assistive device (shower bench,  simulated)     Upper Body Dressing/Undressing Upper body dressing   What is the patient wearing?: Pull over shirt    Upper body assist Assist Level: Independent    Lower Body Dressing/Undressing Lower body dressing      What is the patient wearing?: Pants     Lower body assist Assist for lower body dressing: Independent with assitive device     Toileting Toileting    Toileting assist Assist for toileting: Independent with assistive device (simulated)     Transfers Chair/bed transfer  Transfers assist     Chair/bed transfer assist level: Independent with assistive device Chair/bed transfer assistive device: Berline Chough assist      Assist level: Supervision/Verbal cueing Assistive device: Walker-rolling (r prosthesis) Max distance: 75   Walk 10 feet activity   Assist     Assist level: Supervision/Verbal cueing Assistive device: Walker-rolling   Walk 50 feet activity   Assist Walk 50 feet with 2 turns activity did not occur: Safety/medical concerns  Assist level: Supervision/Verbal cueing Assistive device: Walker-rolling    Walk 150 feet activity   Assist Walk 150 feet activity did not occur: Safety/medical concerns  Walk 10 feet on uneven surface  activity   Assist Walk 10 feet on uneven surfaces activity did not occur: Safety/medical concerns         Wheelchair     Assist Is the patient using a wheelchair?: Yes Type of Wheelchair: Manual    Wheelchair assist level: Independent Max wheelchair distance: 200    Wheelchair 50 feet with 2 turns activity    Assist        Assist Level: Independent   Wheelchair 150 feet activity     Assist      Assist Level: Independent   Medical Problem List and Plan: 1.  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   D/c home today  2.  Antithrombotics: -DVT/anticoagulation:   Pharmaceutical: Heparin             -antiplatelet therapy: Aspirin 81  mg daily 3. Pain Management: Oxycodone as needed 4. Mood: Provide emotional support             -antipsychotic agents: N/A 5. Neuropsych: This patient is capable of making decisions on his own behalf. 6. Skin/Wound Care:  DCed VAC on 9/14 7. Fluids/Electrolytes/Nutrition: Routine in and outs with follow-up chemistries 8.  Acute blood loss anemia.  Continue iron supplement 9.  Diabetes mellitus with peripheral neuropathy.  Hemoglobin A1c 9.4.  SSI. Basaglar 15 unit daily  CBG (last 3)  Recent Labs    02/14/21 1626 02/14/21 2037 02/15/21 0612  GLUCAP 103* 196* 103*     Elevated hs CBG yesterday o/w controlled  10.  Hypertension.  Norvasc 10 mg daily.  Monitor with increased mobility  Elevated this AM, otherwise controlled 11.  Hyperlipidemia.  Lipitor 12.  CKD stage II.  Creatinine baseline 1.26-1.52.  Follow-up chemistries  Creatinine 1.32 on 9/14  Encourage fluids 13.  Diastolic congestive heart failure.  Monitor for any signs of fluid overload 14. Constipation Improving 15.  Leukocytosis  WBCs 12.7 on 9/14, improving  Afebrile, no signs/symptoms of infection  Continue to monitor   LOS: 11 days A FACE TO FACE EVALUATION WAS PERFORMED  Erick Colace 02/15/2021, 9:28 AM

## 2021-02-15 NOTE — Progress Notes (Signed)
Patient ID: Joseph [name], male   DOB: 03-15-1946, 75 y.o.   MRN: [id number]  BSC ordered trough [employer]. No PT recommendations.  [place], [place] [phone]

## 2021-02-15 NOTE — Progress Notes (Signed)
INPATIENT REHABILITATION DISCHARGE NOTE   Discharge instructions by: Jesusita Oka, PA   Verbalized understanding: Yes  Skin care/Wound care: Done. Pt demonstrated doffing and donning of shrinker   Pain: pain med given prior to DC.  IV's: done  Tubes/Drains: none  Safety instructions: done  Patient belongings: sent with pt  Discharged to: home  Discharged via: family transport  Notes: done

## 2021-02-15 NOTE — Progress Notes (Signed)
Inpatient Rehabilitation Care Coordinator Discharge Note   Patient Details  Name: Joseph [name] MRN: [id number] Date of Birth: [birth year]   Discharge location: Home  Length of Stay: 11 Days  Discharge activity level: Mod I/Sup  Home/community participation: Patient will have assistance fromm family and friends. [facility] Aide  Patient response UE:KCMKLK Literacy - How often do you need to have someone help you when you read instructions, pamphlets, or other written material from your doctor or pharmacy?: Never  Patient response JZ:PHXTAV Isolation - How often do you feel lonely or isolated from those around you?: Never  Services provided included: MD, RD, PT, OT, SLP, CM, RN, TR, Pharmacy, Neuropsych, SW  Financial Services:  Field seismologist Utilized: Medicare    Choices offered to/list presented to: patient  Follow-up services arranged:  Home Health Home Health Agency: [facility] (PT OT SN AIDE)         Patient response to transportation need: Is the patient able to respond to transportation needs?: Yes In the past 12 months, has lack of transportation kept you from medical appointments or from getting medications?: No In the past 12 months, has lack of transportation kept you from meetings, work, or from getting things needed for daily living?: No    Comments (or additional information):  Patient/Family verbalized understanding of follow-up arrangements:  Yes  Individual responsible for coordination of the follow-up plan: patient, [phone]  Confirmed correct DME delivered: Andria Rhein 02/15/2021    Andria Rhein

## 2021-02-15 NOTE — Progress Notes (Signed)
Patient ID: Joseph [name], male   DOB: April 26, 1946, 75 y.o.   MRN: [id number]  SW informed pt received BSC in 2020  [place], [place] [phone]

## 2021-02-16 ENCOUNTER — Telehealth: Payer: Self-pay

## 2021-02-16 ENCOUNTER — Other Ambulatory Visit: Payer: Self-pay

## 2021-02-16 NOTE — Telephone Encounter (Signed)
He has only been seen by Marylene Land and it looks like he will need a visit with me to meet face-to-face requirement by insurance company prior to approval of his wheelchair.  He can do a virtual visit if he would like.

## 2021-02-16 NOTE — Telephone Encounter (Signed)
From the discharge call:  He said he is doing pretty good,no questions or concerns.   he said he has all medications and did not have any questions about his med regime.   He wants a new wheelchair and bedside commode. . Informed him that Dr Alvis Lemmings would be notified of the request for a wheelchair.   Regarding the bedside commode -he was told in the hospital that his insurance will not cover a bedside commode because he received one 2 years ago. Provided him with the phone number for [facility] because many times they have used DME available at no cost.  Has RW and an old wheelchair.  Uses wheelchair because it is safer.  Has RLE prosthesis and has LLE limb protector.  He said he is able to manage his personal care, meal preparation and medications by himself. He said his surgical site is healing well, no drainage and he has a daughter who is a CNA has checked it since he has returned home.   He has an appointment with Dr Alvis Lemmings 04/01/2021 and did not want to re-schedule to be seen sooner.  He said he wants to see Dr Lajoyce Corners first and then will determine if he needs to see Dr Alvis Lemmings before 04/01/2021.

## 2021-02-16 NOTE — Telephone Encounter (Signed)
Transition Care Management Follow-up Telephone Call Date of discharge and from where: 02/15/2021, [facility]  How have you been since you were released from the hospital? He said he is doing pretty good.  Any questions or concerns? No  Items Reviewed: Did the pt receive and understand the discharge instructions provided? Yes  Medications obtained and verified? Yes - he said he has all medications and did not have any questions about his med regime.  Other? No  Any new allergies reported?  no Do you have support at home? Yes  - he said he has a lot of help from family and friends.   Home Care and Equipment/Supplies: Were home health services ordered? yes If so, what is the name of the agency? [facility]  Has the agency set up a time to come to the patient's home? They have not called him yet. Instructed him to call the agency if he does not hear from them by tomorrow.  The agency phone number is on his AVS.  Were any new equipment or medical supplies ordered?  No What is the name of the medical supply agency? N/a Were you able to get the supplies/equipment? not applicable Do you have any questions related to the use of the equipment or supplies? No  He wants a new wheelchair and bedside commode. . Informed him that Dr Alvis Lemmings would be notified of the request for a wheelchair.   Regarding the bedside commode -he was told in the hospital that his insurance will not cover a bedside commode because he received one 2 years ago. Provided him with the phone number for [facility] because many times they have used DME available at no cost.   Functional Questionnaire: (I = Independent and D = Dependent) ADLs: independent. Has RW and wheelchair.  Uses wheelchair more because it is safer.  Has RLE prosthesis and has LLE limb protector.  He said he is able to manage his personal care, meal preparation and medications by himself. He said his surgical site is healing well, no drainage  and he has a daughter who is a CNA has checked it since he has returned home.     Follow up appointments reviewed:  PCP Hospital f/u appt confirmed?  He has an appointment with Dr Alvis Lemmings 04/01/2021 and did not want to re-schedule to be seen sooner.  He said he wants to see Dr Lajoyce Corners first and then will determine if he needs to see Dr Alvis Lemmings before 04/01/2021.    Specialist Hospital f/u appt confirmed?  He plans to call Dr Lajoyce Corners today to schedule a follow up appointment  ;  PMR appointment -  03/10/2021.     Are transportation arrangements needed?  He uses [facility] for wheelchair transport.  If their condition worsens, is the pt aware to call PCP or go to the Emergency Dept.? Yes Was the patient provided with contact information for the PCP's office or ED? Yes Was to pt encouraged to call back with questions or concerns? Yes

## 2021-02-17 ENCOUNTER — Encounter: Payer: Self-pay | Admitting: *Deleted

## 2021-02-17 ENCOUNTER — Other Ambulatory Visit: Payer: Self-pay | Admitting: *Deleted

## 2021-02-17 ENCOUNTER — Other Ambulatory Visit: Payer: Self-pay

## 2021-02-17 NOTE — Telephone Encounter (Signed)
Call laced to [employer] to inquire if patient is eligible for a new wheelchair.  Spoke to Welty who said he is not eligible for a new one, he just received a wheelchair in 2020 and his insurance will only pay for one every 5 years.     Attempted to contact patient # [phone] to inform him that per [facility], he is not eligible for a new wheelchair. . Message left with call back requested to this CM.

## 2021-02-17 NOTE — Patient Outreach (Signed)
[facility]) Care Management  02/17/2021  [name] Jun 13, 1945 [id number]   [facility] Unsuccessful outreach  Mr Montenegro Browning was referred to [facility] on 10/08/20  Referral Reason: to be placed in complex, chronic or disease mgmt. Insurance: medicare  He was recently hospitalized for bilateral below knee amputation and completed a [facility] stay He was discharged home on 02/15/21   He left RN CM a voice message on 02/15/21 inquiring about updates on any resources  RN CM was notified by [facility] hospital liaison of his discharge home  Outreach attempt to the home number [phone]  No answer. [facility] RN CM left HIPAA [facility] Portability and Accountability Act) compliant voicemail message along with CM's contact info.   Plan: [facility] RN CM scheduled this patient for another call attempt within 4-7 business days Unsuccessful outreach on 02/17/21   Aydia Maj L. [name], RN, BSN, CCM [facility] Telephonic Care Management Care Coordinator Office number [phone] Mobile number [phone]  Main [facility] number [phone] Fax number [phone]

## 2021-02-17 NOTE — Patient Outreach (Signed)
[facility]) Care Management  02/17/2021  Montenegro Bonaventura 04/15/1946 [id number]   Incoming call from complex care patient  Mr Montenegro Wint was referred to [facility] on 10/08/20  Referral Reason: to be placed in complex, chronic or disease mgmt. Insurance: medicare    Mr Griffie returned a call to RN CM  He is able to verify HIPAA identifiers   Assessment Mr Hawes reports he is doing well  He reports he is mobile and trying to follow his discharge instructions He is completing errands with his daughter and her family   Vocational rehab  He is pending a return call from the staff He reports he received an extension letter from vocational rehab recently    Meals Interested in more meals on wheels services  Home services  He voices more interest in Personal care services (PCS) aide vs home health services  RN CM discussed with him that pcs is an out of pocket expense if he is not a medicaid patient He states he has a Personal assistant completed online by his daughter "a few days ago" Discussed with him the standard time for [facility] medicaid eligibility can be 30-45 days after all the items needed for medicaid application has been provided  He reports for transportation he will use [employer] transportation services   Family will be visiting at intervals   Plans Patient agrees to care plan and follow up within the next 7-10 business days  Knight Oelkers L. [name], RN, BSN, CCM [facility] Telephonic Care Management Care Coordinator Office number [phone] Main [facility] number [phone] Fax number [phone]

## 2021-02-19 ENCOUNTER — Telehealth: Payer: Self-pay

## 2021-02-19 NOTE — Telephone Encounter (Signed)
Rosanne Ashing called and patient will start [facility] therapies on Monday 9/26

## 2021-02-22 DIAGNOSIS — I152 Hypertension secondary to endocrine disorders: Secondary | ICD-10-CM | POA: Diagnosis not present

## 2021-02-22 DIAGNOSIS — E1142 Type 2 diabetes mellitus with diabetic polyneuropathy: Secondary | ICD-10-CM | POA: Diagnosis not present

## 2021-02-22 DIAGNOSIS — E1151 Type 2 diabetes mellitus with diabetic peripheral angiopathy without gangrene: Secondary | ICD-10-CM | POA: Diagnosis not present

## 2021-02-22 DIAGNOSIS — D631 Anemia in chronic kidney disease: Secondary | ICD-10-CM | POA: Diagnosis not present

## 2021-02-22 DIAGNOSIS — Z9181 History of falling: Secondary | ICD-10-CM | POA: Diagnosis not present

## 2021-02-22 DIAGNOSIS — Z89511 Acquired absence of right leg below knee: Secondary | ICD-10-CM | POA: Diagnosis not present

## 2021-02-22 DIAGNOSIS — I480 Paroxysmal atrial fibrillation: Secondary | ICD-10-CM | POA: Diagnosis not present

## 2021-02-22 DIAGNOSIS — E1159 Type 2 diabetes mellitus with other circulatory complications: Secondary | ICD-10-CM | POA: Diagnosis not present

## 2021-02-22 DIAGNOSIS — K5900 Constipation, unspecified: Secondary | ICD-10-CM | POA: Diagnosis not present

## 2021-02-22 DIAGNOSIS — I70202 Unspecified atherosclerosis of native arteries of extremities, left leg: Secondary | ICD-10-CM | POA: Diagnosis not present

## 2021-02-22 DIAGNOSIS — E1122 Type 2 diabetes mellitus with diabetic chronic kidney disease: Secondary | ICD-10-CM | POA: Diagnosis not present

## 2021-02-22 DIAGNOSIS — I5032 Chronic diastolic (congestive) heart failure: Secondary | ICD-10-CM | POA: Diagnosis not present

## 2021-02-22 DIAGNOSIS — M791 Myalgia, unspecified site: Secondary | ICD-10-CM | POA: Diagnosis not present

## 2021-02-22 DIAGNOSIS — D72829 Elevated white blood cell count, unspecified: Secondary | ICD-10-CM | POA: Diagnosis not present

## 2021-02-22 DIAGNOSIS — Z4781 Encounter for orthopedic aftercare following surgical amputation: Secondary | ICD-10-CM | POA: Diagnosis not present

## 2021-02-22 DIAGNOSIS — E785 Hyperlipidemia, unspecified: Secondary | ICD-10-CM | POA: Diagnosis not present

## 2021-02-22 DIAGNOSIS — R3915 Urgency of urination: Secondary | ICD-10-CM | POA: Diagnosis not present

## 2021-02-22 DIAGNOSIS — N182 Chronic kidney disease, stage 2 (mild): Secondary | ICD-10-CM | POA: Diagnosis not present

## 2021-02-22 DIAGNOSIS — Z794 Long term (current) use of insulin: Secondary | ICD-10-CM | POA: Diagnosis not present

## 2021-02-22 DIAGNOSIS — S80221D Blister (nonthermal), right knee, subsequent encounter: Secondary | ICD-10-CM | POA: Diagnosis not present

## 2021-02-22 DIAGNOSIS — D62 Acute posthemorrhagic anemia: Secondary | ICD-10-CM | POA: Diagnosis not present

## 2021-02-22 DIAGNOSIS — Z7982 Long term (current) use of aspirin: Secondary | ICD-10-CM | POA: Diagnosis not present

## 2021-02-22 DIAGNOSIS — Z89512 Acquired absence of left leg below knee: Secondary | ICD-10-CM | POA: Diagnosis not present

## 2021-02-23 ENCOUNTER — Telehealth: Payer: Self-pay

## 2021-02-23 ENCOUNTER — Other Ambulatory Visit: Payer: Self-pay | Admitting: *Deleted

## 2021-02-23 DIAGNOSIS — Z89512 Acquired absence of left leg below knee: Secondary | ICD-10-CM | POA: Diagnosis not present

## 2021-02-23 DIAGNOSIS — Z4781 Encounter for orthopedic aftercare following surgical amputation: Secondary | ICD-10-CM | POA: Diagnosis not present

## 2021-02-23 DIAGNOSIS — E1122 Type 2 diabetes mellitus with diabetic chronic kidney disease: Secondary | ICD-10-CM | POA: Diagnosis not present

## 2021-02-23 DIAGNOSIS — I5032 Chronic diastolic (congestive) heart failure: Secondary | ICD-10-CM | POA: Diagnosis not present

## 2021-02-23 DIAGNOSIS — E1159 Type 2 diabetes mellitus with other circulatory complications: Secondary | ICD-10-CM | POA: Diagnosis not present

## 2021-02-23 DIAGNOSIS — I152 Hypertension secondary to endocrine disorders: Secondary | ICD-10-CM | POA: Diagnosis not present

## 2021-02-23 NOTE — Telephone Encounter (Signed)
Rosanne Ashing, PT from [facility] states patient needs another wheelchair. Patient will need wheelchair eval at Hospital f/u appt

## 2021-02-23 NOTE — Telephone Encounter (Signed)
Jim,PT from [facility] called requesting verbal orders for HHPT 1wk4, 1wkeow4 and for MSW. Orders approved and given. Also patient needs a wheelchair.

## 2021-02-23 NOTE — Patient Outreach (Signed)
[facility]) Care Management  02/23/2021  Montenegro Ostrosky 1945/07/08 [id number]   [facility] Care coordination- follow on community resources   Outreach to vocational rehab [phone] (main #) /0526 Sue Lush) to follow up on resources for patient Reported they completed his intake, are awaiting some medical records for patient  Last spoke with patient on 02/16/21 (the day Fax [phone] attn Sue Lush  RN CM faxed pt discharge summary information  Spoke with Laurence Spates [place] meals on wheels [phone] to check on a status  She wrote down his information - hospital discharge day, name and home number  She reports she will check and outreach to the patient  Spoke with Debria Garret at [employer] (DSS) [phone]  Debria Garret was able to share that he applied for assistance for crisis program only in April and July 2022  Requested an adult medicaid application to be mailed to the patient   Outreach to Mr Misiaszek He is able to verify HIPAA identifiers  He reports he is doing well  Home health/Resources He confirms he is being seen by [place] home health services (RN & PT)  He reports he is doing so well with [facility] PT , that he has been informed his session may conclude soon Updated him on the above outreaches and responses for vocational rehab, meals on wheels and DSS He reports he will check with his daughter on the medicaid application  DME RN CM discussed noted need for home wheelchair and bedside commode. Discussed medicare guidelines for providing durable medical equipment (DME) He voiced understanding of paid DME every 3-5 years  He updated RN CM he does not need a new wheelchair right now. He would like one in the future. He reports his present one is still operational and he only needs to have a left foot rest applied to manage his left BKA RN CM reviewed possible options like local [employer] or local [employer]  supply store He voiced interest in the number for Marice Potter  He was given the number [phone]  [phone] to allow him to outreach    Plan Patient agrees to care plan and follow up witihin the next 7-10 business days   Gale Klar L. [name], RN, BSN, CCM [facility] Telephonic Care Management Care Coordinator Office number [phone] Mobile number [phone]  Main [facility] number [phone] Fax number [phone]

## 2021-02-25 ENCOUNTER — Ambulatory Visit (INDEPENDENT_AMBULATORY_CARE_PROVIDER_SITE_OTHER): Payer: Medicare Other | Admitting: Physician Assistant

## 2021-02-25 ENCOUNTER — Telehealth: Payer: Self-pay | Admitting: Orthopedic Surgery

## 2021-02-25 ENCOUNTER — Other Ambulatory Visit: Payer: Self-pay | Admitting: Physician Assistant

## 2021-02-25 ENCOUNTER — Other Ambulatory Visit: Payer: Self-pay

## 2021-02-25 ENCOUNTER — Encounter: Payer: Self-pay | Admitting: Orthopedic Surgery

## 2021-02-25 DIAGNOSIS — E1122 Type 2 diabetes mellitus with diabetic chronic kidney disease: Secondary | ICD-10-CM | POA: Diagnosis not present

## 2021-02-25 DIAGNOSIS — I152 Hypertension secondary to endocrine disorders: Secondary | ICD-10-CM | POA: Diagnosis not present

## 2021-02-25 DIAGNOSIS — I5032 Chronic diastolic (congestive) heart failure: Secondary | ICD-10-CM | POA: Diagnosis not present

## 2021-02-25 DIAGNOSIS — Z4781 Encounter for orthopedic aftercare following surgical amputation: Secondary | ICD-10-CM | POA: Diagnosis not present

## 2021-02-25 DIAGNOSIS — Z89512 Acquired absence of left leg below knee: Secondary | ICD-10-CM | POA: Diagnosis not present

## 2021-02-25 DIAGNOSIS — E1159 Type 2 diabetes mellitus with other circulatory complications: Secondary | ICD-10-CM | POA: Diagnosis not present

## 2021-02-25 DIAGNOSIS — Z89511 Acquired absence of right leg below knee: Secondary | ICD-10-CM

## 2021-02-25 MED ORDER — OXYCODONE HCL 5 MG PO TABS: 5.0000 mg | ORAL_TABLET | ORAL | 0 refills | Status: DC | PRN

## 2021-02-25 MED ORDER — MISC. DEVICES MISC: 0 refills | Status: AC

## 2021-02-25 NOTE — Telephone Encounter (Signed)
Pt wanted to make sure his rx was sent to the [facility] on [address].

## 2021-02-25 NOTE — Telephone Encounter (Signed)
Just called in

## 2021-02-25 NOTE — Telephone Encounter (Signed)
Call placed to patient regarding wheelchair order.  He said he does not need a new wheelchair, he just needs the leg extension and foot rest for the left side.    Call placed to [facility], spoke to [place] who said that they need an order for the leg extension and foot rest.   Call placed to patient again and informed him that the request for an order for wheel chair leg extension will be sent to Dr Alvis Lemmings.

## 2021-02-25 NOTE — Telephone Encounter (Signed)
Order has been printed and will be faxed to [facility] when PCP signs.

## 2021-02-25 NOTE — Progress Notes (Signed)
Office Visit Note   Patient: Joseph [name]           Date of Birth: Aug 15, 1945           MRN: [id number] Visit Date: 02/25/2021              Requested by: Hoy Register, MD [address] [place],  [place] [address] PCP: Hoy Register, MD  Chief Complaint  Patient presents with   Left Knee - Routine Post Op      HPI: Patient is 3 weeks status post left below-knee amputation.  He has been doing very well.  He has no complaints He is anxious to get his prosthetic.  Assessment & Plan: Visit Diagnoses: No diagnosis found.  Plan: Patient was provided a prescription for left below-knee amputation prosthetic and supplies K3 level.  Also given a prescription for smaller shrinkers.  We will follow-up in 3 weeks.  Follow-Up Instructions: No follow-ups on file.   Ortho Exam  Patient is alert, oriented, no adenopathy, well-dressed, normal affect, normal respiratory effort. Well-healed surgical incision well apposed wound edges no cellulitis minimal swelling no drainage no signs of infection Patient is a new left transtibial  amputee.  Patient's current comorbidities are not expected to impact the ability to function with the prescribed prosthesis. Patient verbally communicates a strong desire to use a prosthesis. Patient currently requires mobility aids to ambulate without a prosthesis.  Expects not to use mobility aids with a new prosthesis.  Patient is a K3 level ambulator that spends a lot of time walking around on uneven terrain over obstacles, up and down stairs, and ambulates with a variable cadence.    Imaging: No results found. No images are attached to the encounter.  Labs: Lab Results  Component Value Date   HGBA1C 9.4 (H) 01/26/2021   HGBA1C 11.7 (A) 12/30/2020   HGBA1C 9.8 (A) 02/06/2020   ESRSEDRATE 120 (H) 01/30/2021   CRP 14.5 (H) 01/30/2021   REPTSTATUS 02/04/2021 FINAL 01/30/2021   REPTSTATUS 02/04/2021 FINAL 01/30/2021   CULT  01/30/2021    NO  GROWTH 5 DAYS Performed at [facility], [address] [place], [place] [address]    CULT  01/30/2021    NO GROWTH 5 DAYS Performed at [facility], [address], [place], [place] [address]      Lab Results  Component Value Date   ALBUMIN 2.5 (L) 02/05/2021   ALBUMIN 2.5 (L) 02/04/2021   ALBUMIN 3.1 (L) 01/30/2021    Lab Results  Component Value Date   MG 1.8 02/04/2021   MG 1.7 02/03/2021   MG 2.0 11/15/2018   No results found for: VD25OH  No results found for: PREALBUMIN CBC EXTENDED Latest Ref Rng & Units 02/10/2021 02/05/2021 02/04/2021  WBC 4.0 - 10.5 K/uL 12.7(H) 15.3(H) 16.4(H)  RBC 4.22 - 5.81 MIL/uL 3.36(L) 3.13(L) 3.09(L)  HGB 13.0 - 17.0 g/dL 10.0(L) 9.4(L) 9.3(L)  HCT 39.0 - 52.0 % 30.1(L) 28.6(L) 28.6(L)  PLT 150 - 400 K/uL 443(H) 381 398  NEUTROABS 1.7 - 7.7 K/uL 6.6 11.2(H) 10.2(H)  LYMPHSABS 0.7 - 4.0 K/uL 5.0(H) 3.2 5.6(H)     There is no height or weight on file to calculate BMI.  Orders:  No orders of the defined types were placed in this encounter.  No orders of the defined types were placed in this encounter.    Procedures: No procedures performed  Clinical Data: No additional findings.  ROS:  All other systems negative,  except as noted in the HPI. Review of Systems  Objective: Vital Signs: There were no vitals taken for this visit.  Specialty Comments:  No specialty comments available.  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)    Leukocytosis    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HCC) 11/20/2018   Paroxysmal atrial fibrillation (HCC)    Diabetic polyneuropathy associated with type 2 diabetes mellitus (HCC)    PVD (peripheral vascular disease) (HCC)    Critical lower limb ischemia (HCC)    Sepsis with acute renal failure (HCC) 11/13/2018   Acute renal failure superimposed on stage 2 chronic kidney disease (HCC)    'light-for-dates' infant with signs of fetal malnutrition 10/17/2018   DM (diabetes mellitus) (HCC) 07/02/2012   Hypertension associated with diabetes (HCC) 07/02/2012   Past Medical History:  Diagnosis Date   Cellulitis of right lower extremity    Diabetes mellitus type 2 in nonobese (HCC)    Gangrene of right foot (HCC)    Hypertension    Open wound of right foot     Family History  Problem Relation Age of Onset   Diabetes Father    Cancer Neg Hx    CAD Neg Hx     Past Surgical History:  Procedure Laterality Date   ABDOMINAL AORTOGRAM W/LOWER EXTREMITY Left 01/27/2021   Procedure: ABDOMINAL AORTOGRAM W/LOWER EXTREMITY;  Surgeon: Leonie Douglas, MD;  [facility];  Service: Cardiovascular;  Laterality: Left;   AMPUTATION Right 11/16/2018   Procedure: RIGHT BELOW KNEE AMPUTATION;  Surgeon: [name], MD;  Location[facility] OR;  Service: Orthopedics;  Laterality: Right;   AMPUTATION Left 02/03/2021   Procedure: LEFT BELOW KNEE AMPUTATION;  Surgeon: [name], MD;  Location: [facility] OR;  Service: Orthopedics;  Laterality: Left;   APPLICATION OF WOUND VAC  02/03/2021   Procedure: APPLICATION OF WOUND VAC;  Surgeon: [name], MD;  Location: [facility] OR;  Service: Orthopedics;;   HAND SURGERY     Social History   Occupational History   Occupation: retired     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

## 2021-03-04 ENCOUNTER — Other Ambulatory Visit: Payer: Self-pay | Admitting: Pharmacist

## 2021-03-04 ENCOUNTER — Other Ambulatory Visit: Payer: Self-pay

## 2021-03-04 ENCOUNTER — Other Ambulatory Visit: Payer: Self-pay | Admitting: Family Medicine

## 2021-03-04 ENCOUNTER — Other Ambulatory Visit: Payer: Self-pay | Admitting: *Deleted

## 2021-03-04 DIAGNOSIS — Z4781 Encounter for orthopedic aftercare following surgical amputation: Secondary | ICD-10-CM | POA: Diagnosis not present

## 2021-03-04 DIAGNOSIS — E1159 Type 2 diabetes mellitus with other circulatory complications: Secondary | ICD-10-CM | POA: Diagnosis not present

## 2021-03-04 DIAGNOSIS — I10 Essential (primary) hypertension: Secondary | ICD-10-CM

## 2021-03-04 DIAGNOSIS — I5032 Chronic diastolic (congestive) heart failure: Secondary | ICD-10-CM | POA: Diagnosis not present

## 2021-03-04 DIAGNOSIS — I152 Hypertension secondary to endocrine disorders: Secondary | ICD-10-CM | POA: Diagnosis not present

## 2021-03-04 DIAGNOSIS — E1165 Type 2 diabetes mellitus with hyperglycemia: Secondary | ICD-10-CM

## 2021-03-04 DIAGNOSIS — E1122 Type 2 diabetes mellitus with diabetic chronic kidney disease: Secondary | ICD-10-CM | POA: Diagnosis not present

## 2021-03-04 DIAGNOSIS — Z89512 Acquired absence of left leg below knee: Secondary | ICD-10-CM | POA: Diagnosis not present

## 2021-03-04 MED ORDER — ACCU-CHEK GUIDE W/DEVICE KIT
PACK | 0 refills | Status: DC
  Filled 2021-03-04: qty 1, fill #0

## 2021-03-04 MED ORDER — AMLODIPINE BESYLATE 10 MG PO TABS
10.0000 mg | ORAL_TABLET | Freq: Every day | ORAL | 0 refills | Status: DC
  Filled 2021-03-04 – 2021-03-19 (×2): qty 30, 30d supply, fill #0

## 2021-03-04 MED ORDER — FERROUS SULFATE 325 (65 FE) MG PO TABS
325.0000 mg | ORAL_TABLET | Freq: Every day | ORAL | 0 refills | Status: DC
  Filled 2021-03-04 – 2021-03-19 (×2): qty 30, 30d supply, fill #0

## 2021-03-04 MED ORDER — ATORVASTATIN CALCIUM 10 MG PO TABS
10.0000 mg | ORAL_TABLET | Freq: Every day | ORAL | 0 refills | Status: DC
  Filled 2021-03-04 – 2021-03-22 (×3): qty 30, 30d supply, fill #0

## 2021-03-04 NOTE — Patient Outreach (Addendum)
[name]) Care Management  03/04/2021  Joseph [name] 1945/08/29 [id number]   [name] outreach to complex care patient [name] was referred to [name] on 10/08/20  Referral Reason: to be placed in complex, chronic or disease mgmt. Insurance: medicare part Joseph & B only    He is able to verify HIPAA identifiers    Assessment He reports he is doing well He has food   Home health/Resources Completed home health sessions today with Joseph [name] by [facility] RN today Has stopped his intake of pain medicines  Orthopedic AKA (above the knee amputation)  Had follow up appointment 02/25/21 and went well and will go to outpatient physical therapy and [facility] on 03/10/21  Good skin care discussed, encouraged dry cloth use to remove dry skin  Medicaid application arrived and completed  durable medical equipment (DME)  Got all DME (left leg extension and foot rest) except accu chek monitor When he left the [name] he was given  RN CM assisted with Joseph [facility] with [name] [phone]  Joseph [name] will have Joseph [name] provide Joseph prescription for accu chek device He discussed need of Joseph one time refills of medications also prior to schedule 04/01/21 with Joseph [name]    Diabetes Cbg values are in the 100's  Re reviewed insurance coverage and medicare parts Explained medicare parts Joseph, B, C, & D in detail- what is covered in each and how each is obtained Answered questions Provided him the [employer] Insurance Information Program [facility]) number [phone] Encouraged him to outreach to [facility] and to take advantage of the active enrollment process  Appointments reviewed in EPIC  He wrote his appointments in his [facility] calendar  Allowed him time to ventilate his feelings  Plans Patient agrees to care plan and follow up within the next 30 business days  Goals Addressed               This Visit's Progress     Patient Stated      [name]) Find Help in My Community (pt-stated)   On track     Timeframe:  Long-Range Goal Priority:  Medium Start Date:           10/20/20                  Expected End Date:          04/28/21             Follow Up Date 04/02/21 Barriers: Knowledge   - begin Joseph notebook of services in my neighborhood or community - call 211 when I need some help - follow-up on any referrals for help I am given - make Joseph list of family or friends that I can call     Notes:  03/04/21 wrote his appointments in his calendar with RN CM. Asked for assist with DME and medicine management Completed medicaid application To follow up on SHIIP and medicare coverage 02/17/21 unsuccessful outreach 9/3 to 02/15/21 hospitalized s/p bilateral BKA 12/30/20 continues to go to food pantry, staying in touch with vocational rehab, connected on 12/30/20 with pcp RN CM, continues to work with DSS on his utility bill payment 11/18/20 able to speak briefly, confirms had outreach from vocational rehab staff about home improvements, has received medicaid application 11/03/20 received BP cuff from [facility], monitoring, made new pcp appointment with Joseph [name] PAC for 12/02/20 Pt to further Outreach to 211,  Department of Social Services (DSS) for medicaid application, follow up on resources provided Review materials/resources being sent to him via mail  10/27/20 pt has not made new pcp appointment- encouraged to complete this task 10/22/20 had outreach from [name] care guide for resources for meals -referral to [place] [name] via e-mail to Joseph [name] at [employer].  Patient was shopping and unable to write down information for Vocational [facility]hab Services for assistance with bathroom modifications. Joseph message was left on patient's  voicemail  with the number to call per patient request Care guide will follow up with patient by phone over the next day 10/20/20 voiced interest in financial resources, meals on wheels/food resources. Community resources, home  improvements in bathroom      [name]) Monitor and Manage My Blood Sugar-Diabetes Type 2 (pt-stated)   On track     Timeframe:  Long-Range Goal Priority:  High Start Date:              10/20/20               Expected End Date:       04/28/21               Follow Up Date 03/02/21 Barriers: Knowledge   - check blood sugar at prescribed times - check blood sugar if I feel it is too high or too low      Notes:   03/04/21 reports his blood sugars are in 100's checking daily, to get new accu chek from [facility] 02/17/21 unsuccessful outreach 9/3 to 02/15/21 hospitalized s/p bilateral BKA 02/03/21 ED on 01/26/21 dx left great toe osteomyelitis 01/28/21 postponed recommended surgery per Joseph [name]. 01/30/21 return to ED after seen by Joseph [name] and admitted for 02/03/21 left below knee transtibial amputation  12/30/20 Pt admits to needing Joseph change in he diet and exercise after an  HgbA1c of 11.7 today vs 9.8 on 02/06/20 Left foot diabetic wound- followed by Joseph [name], podiatry. He reports he changes the bandage now one to two days. Follows up on January 11 2021 11/03/20 monitoring home cbg Reports he believes he has DM under control Denies cbg elevations or lows made aware of last HgA1c of 9.8 Now with confirmed meals on wheels daily delivers from 02-1129 am 10/20/20 Denies issues with cbg but diet management          Shaquoya Cosper L. [name], RN, BSN, CCM [facility] Telephonic Care Management Care Coordinator Office number [phone] [facility] number [phone] Fax number [phone]

## 2021-03-05 ENCOUNTER — Other Ambulatory Visit: Payer: Self-pay

## 2021-03-05 DIAGNOSIS — S80221D Blister (nonthermal), right knee, subsequent encounter: Secondary | ICD-10-CM | POA: Diagnosis not present

## 2021-03-05 DIAGNOSIS — Z89512 Acquired absence of left leg below knee: Secondary | ICD-10-CM | POA: Diagnosis not present

## 2021-03-05 DIAGNOSIS — I152 Hypertension secondary to endocrine disorders: Secondary | ICD-10-CM | POA: Diagnosis not present

## 2021-03-05 DIAGNOSIS — E785 Hyperlipidemia, unspecified: Secondary | ICD-10-CM

## 2021-03-05 DIAGNOSIS — E1122 Type 2 diabetes mellitus with diabetic chronic kidney disease: Secondary | ICD-10-CM | POA: Diagnosis not present

## 2021-03-05 DIAGNOSIS — D72829 Elevated white blood cell count, unspecified: Secondary | ICD-10-CM

## 2021-03-05 DIAGNOSIS — Z9181 History of falling: Secondary | ICD-10-CM

## 2021-03-05 DIAGNOSIS — I5032 Chronic diastolic (congestive) heart failure: Secondary | ICD-10-CM | POA: Diagnosis not present

## 2021-03-05 DIAGNOSIS — E1151 Type 2 diabetes mellitus with diabetic peripheral angiopathy without gangrene: Secondary | ICD-10-CM | POA: Diagnosis not present

## 2021-03-05 DIAGNOSIS — I70202 Unspecified atherosclerosis of native arteries of extremities, left leg: Secondary | ICD-10-CM

## 2021-03-05 DIAGNOSIS — E1142 Type 2 diabetes mellitus with diabetic polyneuropathy: Secondary | ICD-10-CM | POA: Diagnosis not present

## 2021-03-05 DIAGNOSIS — Z7982 Long term (current) use of aspirin: Secondary | ICD-10-CM

## 2021-03-05 DIAGNOSIS — Z4781 Encounter for orthopedic aftercare following surgical amputation: Secondary | ICD-10-CM | POA: Diagnosis not present

## 2021-03-05 DIAGNOSIS — M791 Myalgia, unspecified site: Secondary | ICD-10-CM

## 2021-03-05 DIAGNOSIS — E1159 Type 2 diabetes mellitus with other circulatory complications: Secondary | ICD-10-CM | POA: Diagnosis not present

## 2021-03-05 DIAGNOSIS — D631 Anemia in chronic kidney disease: Secondary | ICD-10-CM | POA: Diagnosis not present

## 2021-03-05 DIAGNOSIS — R3915 Urgency of urination: Secondary | ICD-10-CM

## 2021-03-05 DIAGNOSIS — N182 Chronic kidney disease, stage 2 (mild): Secondary | ICD-10-CM | POA: Diagnosis not present

## 2021-03-05 DIAGNOSIS — I480 Paroxysmal atrial fibrillation: Secondary | ICD-10-CM | POA: Diagnosis not present

## 2021-03-05 DIAGNOSIS — K5900 Constipation, unspecified: Secondary | ICD-10-CM

## 2021-03-05 DIAGNOSIS — Z89511 Acquired absence of right leg below knee: Secondary | ICD-10-CM

## 2021-03-05 DIAGNOSIS — D62 Acute posthemorrhagic anemia: Secondary | ICD-10-CM

## 2021-03-05 DIAGNOSIS — Z794 Long term (current) use of insulin: Secondary | ICD-10-CM

## 2021-03-10 ENCOUNTER — Other Ambulatory Visit: Payer: Self-pay

## 2021-03-10 ENCOUNTER — Encounter: Payer: Self-pay | Admitting: Physical Medicine and Rehabilitation

## 2021-03-10 ENCOUNTER — Encounter
Payer: Medicare Other | Attending: Physical Medicine and Rehabilitation | Admitting: Physical Medicine and Rehabilitation

## 2021-03-10 VITALS — BP 112/63 | HR 62 | Temp 98.7°F | Ht 75.0 in

## 2021-03-10 DIAGNOSIS — Z89511 Acquired absence of right leg below knee: Secondary | ICD-10-CM | POA: Insufficient documentation

## 2021-03-10 DIAGNOSIS — Z89512 Acquired absence of left leg below knee: Secondary | ICD-10-CM | POA: Insufficient documentation

## 2021-03-10 DIAGNOSIS — Z993 Dependence on wheelchair: Secondary | ICD-10-CM | POA: Insufficient documentation

## 2021-03-10 DIAGNOSIS — E1165 Type 2 diabetes mellitus with hyperglycemia: Secondary | ICD-10-CM | POA: Insufficient documentation

## 2021-03-10 DIAGNOSIS — E1142 Type 2 diabetes mellitus with diabetic polyneuropathy: Secondary | ICD-10-CM | POA: Insufficient documentation

## 2021-03-10 NOTE — Patient Instructions (Addendum)
Pt is a 75 yr old male with R BKA 11/16/2018 with new L BKA as of 02/03/21- Also has DM- last A1c 9.4, HTN, CKD, dCHF, here for f/u on L BKA.  When time to get L BKA prosthesis, can call me and I can prescribe outpt PT when needed- is shaping well, but Dr Lajoyce Corners can also write for therapy as well.   2.  Only transfer until gets new prosthesis- no walking except maybe to get into bathroom.   3. Lay on stomach 1-2x/day for 10-15 minutes to prevent knee/hip contractures.    4. Con't to keep BG's under better control- helps wound healing.    5. No pain meds required.    6. Get Dr Audrie Lia office to remove other 2 staples- since it's surgeon, I don't feel comfortable taking staples out myself.    7. F/U as needed- 3 months for 1 more appointment

## 2021-03-10 NOTE — Progress Notes (Signed)
Subjective:    Patient ID: Joseph [name], male    DOB: [birth year], 75 y.o.   MRN: [id number]  HPI Pt is a 75 yr old male with R BKA 11/16/2018 with new L BKA as of 02/03/21- Also has DM- last A1c 9.4, HTN, CKD, dCHF, here for f/u on L BKA.  Pain not an issue- only soreness if exercises too much- and muscles burn when works out too much.   Wearing prosthesis on R BKA.  Getting around and bathing and changing clothes- doing pretty well.   Hard to stick to more nutritious diet- sticking to what hospital was feeding him.   BG's- running 118-170s per pt. 2-3x/day.   Wearing Lib guard- has some scabs left on L BKA- took staples out ~ 10 days ago.  Wearing shrinker- and ACE wrap. For shaping and "protection".  Was "so ahead"- so done with H/H and outpt for now- until gets new L BKA prosthesis.  Not walking- just transferring with RW- due to having only R BKA prosthesis- and no leg on LLE.   Pain Inventory Average Pain 0 Pain Right Now 0 My pain is  Over activity  In the last 24 hours, has pain interfered with the following? General activity 0 Relation with others 0 Enjoyment of life 0 What TIME of day is your pain at its worst? evening Sleep (in general) Good  Pain is worse with: some activites Pain improves with:  rest Relief from Meds:  no pain  use a walker ability to climb steps?  no do you drive?  no use a wheelchair transfers alone Do you have any goals in this area?  yes  retired Do you have any goals in this area?  yes  No problems in this area  Any changes since last visit?  no  Any changes since last visit?  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  Tobacco Use   Smoking status: Never   Smokeless tobacco: Never  Building services engineer Use: Never used  Substance and Sexual Activity   Alcohol use: Not Currently   Drug use: No   Sexual activity: Not on file  Other Topics Concern   Not on file  Social History Narrative   Lives alone, 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   On fixed Social security income   Has a daughter who is a Lawyer and son does not speak with them every day   Most reliable family members reported to live in [place]   Divorced but remains friends with his ex wife, [name]   Has advance directives but has not as 11/03/20 given a copy to [facility]   Goes to church      Social Determinants of Health   Financial Resource Strain: Medium Risk   Difficulty of Paying Living Expenses: Somewhat hard  Food Insecurity: Food Insecurity Present   Worried About Running Out of Food in the Last Year: Sometimes true   Ran Out of  Food in the Last Year: Sometimes true  Transportation Needs: No Transportation Needs   Lack of Transportation (Medical): No   Lack of Transportation (Non-Medical): No  Physical Activity: Not on file  Stress: No Stress Concern Present   Feeling of Stress : Only a little  Social Connections: Moderately Isolated   Frequency of Communication with Friends and Family: Once a week   Frequency of Social Gatherings with Friends and Family: Once a week   Attends Religious Services: 1 to 4 times per year   Active Member of Clubs or Organizations: Yes   Attends Banker Meetings: 1 to 4 times per year   Marital Status: Never married   Past Surgical History:  Procedure Laterality Date   ABDOMINAL AORTOGRAM W/LOWER EXTREMITY Left 01/27/2021   Procedure: ABDOMINAL AORTOGRAM W/LOWER EXTREMITY;  Surgeon: Leonie Douglas, MD;  [facility];  Service: Cardiovascular;  Laterality: Left;   AMPUTATION Right 11/16/2018    Procedure: RIGHT BELOW KNEE AMPUTATION;  Surgeon: [name], MD;  Location[facility];  Service: Orthopedics;  Laterality: Right;   AMPUTATION Left 02/03/2021   Procedure: LEFT BELOW KNEE AMPUTATION;  Surgeon: [name], MD;  Location[facility];  Service: Orthopedics;  Laterality: Left;   APPLICATION OF WOUND VAC  02/03/2021   Procedure: APPLICATION OF WOUND VAC;  Surgeon: [name], MD;  Location: [facility] OR;  Service: Orthopedics;;   HAND SURGERY     Past Medical History:  Diagnosis Date   Cellulitis of right lower extremity    Diabetes mellitus type 2 in nonobese [facility])    Gangrene of right foot (HCC)    Hypertension    Open wound of right foot    BP 112/63   Pulse 62   Temp 98.7 F (37.1 C)   Ht 6\' 3"  (1.905 m)   SpO2 96%   BMI 27.31 kg/m   Opioid Risk Score:   Fall Risk Score:  `1  Depression screen PHQ 2/9  Depression screen [facility] 2/9 12/03/2020 11/03/2020 10/20/2020 02/06/2020 05/17/2019 01/04/2019  Decreased Interest 0 0 0 0 0 0  Down, Depressed, Hopeless 0 0 0 0 0 0  PHQ - 2 Score 0 0 0 0 0 0  Altered sleeping - - - 0 - 0  Tired, decreased energy - - - 0 - 0  Change in appetite - - - 0 - 0  Feeling bad or failure about yourself  - - - 0 - 0  Trouble concentrating - - - 0 - 0  Moving slowly or fidgety/restless - - - 0 - 0  Suicidal thoughts - - - 0 - 0  PHQ-9 Score - - - 0 - 0  Difficult doing work/chores - - - - - Not difficult at all    Review of Systems  Musculoskeletal:  Positive for gait problem.  Skin:  Positive for wound.       Left BKA wound healing  All other systems reviewed and are negative.     Objective:   Physical Exam  Awake, alert, appropriate, in transport w/c- it's his own, NAD Wearing R BKA prosthesis, Wearing L BKA limb guard, shrinker and ACE wrap-  Has a few dry scabs on medial, middle and lateral aspect of incision- 2 staples left- middle part and lateral aspect -1 in each spot. No drainage- shaping great- and no erythema or warmth.         Assessment & Plan:   Pt is a  75 yr old male with R BKA 11/16/2018 with new L BKA as of 02/03/21- Also has DM- last A1c 9.4, HTN, CKD, dCHF, here for f/u on L BKA.  When time to get L BKA prosthesis, can call me and I can prescribe outpt PT when needed- is shaping well, but Dr Lajoyce Corners can also write for therapy as well.   2.  Only transfer until gets new prosthesis- no walking except maybe to get into bathroom.   3. Lay on stomach 1-2x/day for 10-15 minutes to prevent knee/hip contractures.    4. Con't to keep BG's under better control- helps wound healing.    5. No pain meds required.    6. Get Dr Audrie Lia office to remove other 2 staples- since it's surgeon, I don't feel comfortable taking staples out myself.    7. F/U as needed- but 1 more appointment- in 17months

## 2021-03-10 NOTE — Telephone Encounter (Signed)
Call placed to [facility], spoke to [place] who confirmed that the patient received the leg extension and foot rest for his wheelchair on 03/03/2021.

## 2021-03-18 ENCOUNTER — Ambulatory Visit (INDEPENDENT_AMBULATORY_CARE_PROVIDER_SITE_OTHER): Payer: Medicare Other | Admitting: Orthopedic Surgery

## 2021-03-18 ENCOUNTER — Encounter: Payer: Self-pay | Admitting: Orthopedic Surgery

## 2021-03-18 DIAGNOSIS — Z89511 Acquired absence of right leg below knee: Secondary | ICD-10-CM

## 2021-03-18 DIAGNOSIS — Z89512 Acquired absence of left leg below knee: Secondary | ICD-10-CM

## 2021-03-18 MED ORDER — OXYCODONE HCL 5 MG PO TABS: 5.0000 mg | ORAL_TABLET | ORAL | 0 refills | Status: DC | PRN

## 2021-03-18 NOTE — Addendum Note (Signed)
Addended by: Aldean Baker on: 03/18/2021 01:01 PM   Modules accepted: Orders

## 2021-03-18 NOTE — Progress Notes (Signed)
Office Visit Note   Patient: Joseph [name]           Date of Birth: 1945-11-17           MRN: [id number] Visit Date: 03/18/2021              Requested by: Hoy Register, MD [address] [place],  [place] [address] PCP: Hoy Register, MD  Chief Complaint  Patient presents with   Left Leg - Routine Post Op      HPI: Patient is a 75 year old gentleman who is 6 weeks status post left transtibial amputation.  Patient states that he was fit for his prosthesis last week, this was 5 weeks out from his index surgery.  Patient states that he is using Percocet for his gait training.  Assessment & Plan: Visit Diagnoses:  1. 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 Hx of BKA, left (HCC)     Plan: Patient will get fitted for his final prosthesis in 2 weeks.  Prescription provided for gait training.  Follow-Up Instructions: Return in about 4 weeks (around 04/15/2021).   Ortho Exam  Patient is alert, oriented, no adenopathy, well-dressed, normal affect, normal respiratory effort. Examination the left transtibial amputation is well-healed well consolidated there are 2 remaining staples that are removed.  Imaging: No results found. No images are attached to the encounter.  Labs: Lab Results  Component Value Date   HGBA1C 9.4 (H) 01/26/2021   HGBA1C 11.7 (A) 12/30/2020   HGBA1C 9.8 (A) 02/06/2020   ESRSEDRATE 120 (H) 01/30/2021   CRP 14.5 (H) 01/30/2021   REPTSTATUS 02/04/2021 FINAL 01/30/2021   REPTSTATUS 02/04/2021 FINAL 01/30/2021   CULT  01/30/2021    NO GROWTH 5 DAYS Performed at [facility], [address], [place], [place] [address]    CULT  01/30/2021    NO GROWTH 5 DAYS Performed at [facility], [address], [place], [place] [address]      Lab Results  Component Value Date   ALBUMIN 2.5 (L) 02/05/2021   ALBUMIN 2.5 (L) 02/04/2021   ALBUMIN 3.1 (L) 01/30/2021    Lab Results  Component Value Date   MG 1.8  02/04/2021   MG 1.7 02/03/2021   MG 2.0 11/15/2018   No results found for: VD25OH  No results found for: PREALBUMIN CBC EXTENDED Latest Ref Rng & Units 02/10/2021 02/05/2021 02/04/2021  WBC 4.0 - 10.5 K/uL 12.7(H) 15.3(H) 16.4(H)  RBC 4.22 - 5.81 MIL/uL 3.36(L) 3.13(L) 3.09(L)  HGB 13.0 - 17.0 g/dL 10.0(L) 9.4(L) 9.3(L)  HCT 39.0 - 52.0 % 30.1(L) 28.6(L) 28.6(L)  PLT 150 - 400 K/uL 443(H) 381 398  NEUTROABS 1.7 - 7.7 K/uL 6.6 11.2(H) 10.2(H)  LYMPHSABS 0.7 - 4.0 K/uL 5.0(H) 3.2 5.6(H)     There is no height or weight on file to calculate BMI.  Orders:  No orders of the defined types were placed in this encounter.  No orders of the defined types were placed in this encounter.    Procedures: No procedures performed  Clinical Data: No additional findings.  ROS:  All other systems negative, except as noted in the HPI. Review of Systems  Objective: Vital Signs: There were no vitals taken for this visit.  Specialty Comments:  No specialty comments available.  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)    Leukocytosis    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HCC) 11/20/2018   Paroxysmal atrial fibrillation (HCC)    Diabetic polyneuropathy associated with type 2 diabetes mellitus (HCC)    PVD (peripheral vascular disease) (HCC)    Critical lower limb ischemia (HCC)    Sepsis with acute renal failure  (HCC) 11/13/2018   Acute renal failure superimposed on stage 2 chronic kidney disease (HCC)    'light-for-dates' infant with signs of fetal malnutrition 10/17/2018   DM (diabetes mellitus) (HCC) 07/02/2012   Hypertension associated with diabetes (HCC) 07/02/2012   Past Medical History:  Diagnosis Date   Cellulitis of right lower extremity    Diabetes mellitus type 2 in nonobese (HCC)    Gangrene of right foot (HCC)    Hypertension    Open wound of right foot     Family History  Problem Relation Age of Onset   Diabetes Father    Cancer Neg Hx    CAD Neg Hx     Past Surgical History:  Procedure Laterality Date   ABDOMINAL AORTOGRAM W/LOWER EXTREMITY Left 01/27/2021   Procedure: ABDOMINAL AORTOGRAM W/LOWER EXTREMITY;  Surgeon: Leonie Douglas, MD;  Location: [facility];  Service: Cardiovascular;  Laterality: Left;   AMPUTATION Right 11/16/2018   Procedure: RIGHT BELOW KNEE AMPUTATION;  Surgeon: [name], MD;  [facility];  Service: Orthopedics;  Laterality: Right;   AMPUTATION Left 02/03/2021   Procedure: LEFT BELOW KNEE AMPUTATION;  Surgeon: [name], MD;  Location: [facility] OR;  Service: Orthopedics;  Laterality: Left;   APPLICATION OF WOUND VAC  02/03/2021   Procedure: APPLICATION OF WOUND VAC;  Surgeon: [name], MD;  Location: [facility] OR;  Service: Orthopedics;;   HAND SURGERY     Social History   Occupational History   Occupation: retired     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

## 2021-03-19 ENCOUNTER — Other Ambulatory Visit: Payer: Self-pay

## 2021-03-22 ENCOUNTER — Other Ambulatory Visit: Payer: Self-pay

## 2021-03-23 ENCOUNTER — Other Ambulatory Visit: Payer: Self-pay

## 2021-04-01 ENCOUNTER — Ambulatory Visit: Payer: Medicare Other | Attending: Family Medicine | Admitting: Family Medicine

## 2021-04-01 ENCOUNTER — Other Ambulatory Visit: Payer: Self-pay

## 2021-04-01 ENCOUNTER — Encounter: Payer: Self-pay | Admitting: Family Medicine

## 2021-04-01 VITALS — BP 124/74 | HR 57 | Ht 75.0 in

## 2021-04-01 DIAGNOSIS — I129 Hypertensive chronic kidney disease with stage 1 through stage 4 chronic kidney disease, or unspecified chronic kidney disease: Secondary | ICD-10-CM | POA: Diagnosis not present

## 2021-04-01 DIAGNOSIS — E1165 Type 2 diabetes mellitus with hyperglycemia: Secondary | ICD-10-CM | POA: Diagnosis not present

## 2021-04-01 DIAGNOSIS — Z89512 Acquired absence of left leg below knee: Secondary | ICD-10-CM | POA: Diagnosis not present

## 2021-04-01 DIAGNOSIS — Z89511 Acquired absence of right leg below knee: Secondary | ICD-10-CM | POA: Diagnosis not present

## 2021-04-01 DIAGNOSIS — N182 Chronic kidney disease, stage 2 (mild): Secondary | ICD-10-CM

## 2021-04-01 DIAGNOSIS — E1122 Type 2 diabetes mellitus with diabetic chronic kidney disease: Secondary | ICD-10-CM

## 2021-04-01 DIAGNOSIS — Z1211 Encounter for screening for malignant neoplasm of colon: Secondary | ICD-10-CM

## 2021-04-01 LAB — POCT GLYCOSYLATED HEMOGLOBIN (HGB A1C): HbA1c, POC (controlled diabetic range): 7.6 % — AB (ref 0.0–7.0)

## 2021-04-01 LAB — GLUCOSE, POCT (MANUAL RESULT ENTRY): POC Glucose: 180 mg/dl — AB (ref 70–99)

## 2021-04-01 MED ORDER — ACCU-CHEK GUIDE VI STRP
ORAL_STRIP | 11 refills | Status: DC
Start: 2021-04-01 — End: 2022-03-22
  Filled 2021-04-01 – 2021-09-16 (×3): qty 100, 25d supply, fill #0
  Filled 2021-11-12: qty 100, 25d supply, fill #1
  Filled 2022-02-01: qty 100, 25d supply, fill #2

## 2021-04-01 MED ORDER — ATORVASTATIN CALCIUM 10 MG PO TABS
10.0000 mg | ORAL_TABLET | Freq: Every day | ORAL | 1 refills | Status: DC
  Filled 2021-04-01: qty 90, 90d supply, fill #0
  Filled 2021-05-04 (×2): qty 30, 30d supply, fill #0
  Filled 2021-06-03: qty 30, 30d supply, fill #1
  Filled 2021-06-03: qty 30, 30d supply, fill #0
  Filled 2021-07-19: qty 90, 90d supply, fill #1
  Filled 2021-10-26: qty 30, 30d supply, fill #2

## 2021-04-01 MED ORDER — BASAGLAR KWIKPEN 100 UNIT/ML ~~LOC~~ SOPN
15.0000 [IU] | PEN_INJECTOR | Freq: Every day | SUBCUTANEOUS | 6 refills | Status: DC
  Filled 2021-04-01: qty 30, 200d supply, fill #0
  Filled 2021-05-04 – 2021-06-16 (×2): qty 6, 40d supply, fill #0
  Filled 2021-07-19: qty 6, 40d supply, fill #1
  Filled 2021-08-31: qty 6, 40d supply, fill #2
  Filled 2021-10-08: qty 6, 40d supply, fill #3
  Filled 2021-11-12: qty 6, 40d supply, fill #4
  Filled 2021-12-24: qty 6, 40d supply, fill #5
  Filled 2022-02-01: qty 6, 40d supply, fill #6
  Filled 2022-03-10: qty 6, 40d supply, fill #7

## 2021-04-01 MED ORDER — AMLODIPINE BESYLATE 10 MG PO TABS
10.0000 mg | ORAL_TABLET | Freq: Every day | ORAL | 1 refills | Status: DC
  Filled 2021-04-01: qty 90, 90d supply, fill #0
  Filled 2021-05-04: qty 30, 30d supply, fill #0
  Filled 2021-06-03: qty 30, 30d supply, fill #1
  Filled 2021-06-03: qty 30, 30d supply, fill #0
  Filled 2021-07-19: qty 90, 90d supply, fill #1
  Filled 2021-10-26: qty 30, 30d supply, fill #2

## 2021-04-01 NOTE — Patient Instructions (Signed)
Diabetes Mellitus and Nutrition, Adult When you have diabetes, or diabetes mellitus, it is very important to have healthy eating habits because your blood sugar (glucose) levels are greatly affected by what you eat and drink. Eating healthy foods in the right amounts, at about the same times every day, can help you:  Control your blood glucose.  Lower your risk of heart disease.  Improve your blood pressure.  Reach or maintain a healthy weight. What can affect my meal plan? Every person with diabetes is different, and each person has different needs for a meal plan. Your health care provider may recommend that you work with a dietitian to make a meal plan that is best for you. Your meal plan may vary depending on factors such as:  The calories you need.  The medicines you take.  Your weight.  Your blood glucose, blood pressure, and cholesterol levels.  Your activity level.  Other health conditions you have, such as heart or kidney disease. How do carbohydrates affect me? Carbohydrates, also called carbs, affect your blood glucose level more than any other type of food. Eating carbs naturally raises the amount of glucose in your blood. Carb counting is a method for keeping track of how many carbs you eat. Counting carbs is important to keep your blood glucose at a healthy level, especially if you use insulin or take certain oral diabetes medicines. It is important to know how many carbs you can safely have in each meal. This is different for every person. Your dietitian can help you calculate how many carbs you should have at each meal and for each snack. How does alcohol affect me? Alcohol can cause a sudden decrease in blood glucose (hypoglycemia), especially if you use insulin or take certain oral diabetes medicines. Hypoglycemia can be a life-threatening condition. Symptoms of hypoglycemia, such as sleepiness, dizziness, and confusion, are similar to symptoms of having too much  alcohol.  Do not drink alcohol if: ? Your health care provider tells you not to drink. ? You are pregnant, may be pregnant, or are planning to become pregnant.  If you drink alcohol: ? Do not drink on an empty stomach. ? Limit how much you use to:  0-1 drink a day for women.  0-2 drinks a day for men. ? Be aware of how much alcohol is in your drink. In the [place] one drink equals one 12 oz bottle of beer (355 mL), one 5 oz glass of wine (148 mL), or one 1 oz glass of hard liquor (44 mL). ? Keep yourself hydrated with water, diet soda, or unsweetened iced tea.  Keep in mind that regular soda, juice, and other mixers may contain a lot of sugar and must be counted as carbs. What are tips for following this plan? Reading food labels  Start by checking the serving size on the "Nutrition Facts" label of packaged foods and drinks. The amount of calories, carbs, fats, and other nutrients listed on the label is based on one serving of the item. Many items contain more than one serving per package.  Check the total grams (g) of carbs in one serving. You can calculate the number of servings of carbs in one serving by dividing the total carbs by 15. For example, if a food has 30 g of total carbs per serving, it would be equal to 2 servings of carbs.  Check the number of grams (g) of saturated fats and trans fats in one serving. Choose foods that have   a low amount or none of these fats.  Check the number of milligrams (mg) of salt (sodium) in one serving. Most people should limit total sodium intake to less than 2,300 mg per day.  Always check the nutrition information of foods labeled as "low-fat" or "nonfat." These foods may be higher in added sugar or refined carbs and should be avoided.  Talk to your dietitian to identify your daily goals for nutrients listed on the label. Shopping  Avoid buying canned, pre-made, or processed foods. These foods tend to be high in fat, sodium, and added  sugar.  Shop around the outside edge of the grocery store. This is where you will most often find fresh fruits and vegetables, bulk grains, fresh meats, and fresh dairy. Cooking  Use low-heat cooking methods, such as baking, instead of high-heat cooking methods like deep frying.  Cook using healthy oils, such as olive, canola, or sunflower oil.  Avoid cooking with butter, cream, or high-fat meats. Meal planning  Eat meals and snacks regularly, preferably at the same times every day. Avoid going long periods of time without eating.  Eat foods that are high in fiber, such as fresh fruits, vegetables, beans, and whole grains. Talk with your dietitian about how many servings of carbs you can eat at each meal.  Eat 4-6 oz (112-168 g) of lean protein each day, such as lean meat, chicken, fish, eggs, or tofu. One ounce (oz) of lean protein is equal to: ? 1 oz (28 g) of meat, chicken, or fish. ? 1 egg. ?  cup (62 g) of tofu.  Eat some foods each day that contain healthy fats, such as avocado, nuts, seeds, and fish.   What foods should I eat? Fruits Berries. Apples. Oranges. Peaches. Apricots. Plums. Grapes. Mango. Papaya. Pomegranate. Kiwi. Cherries. Vegetables Lettuce. Spinach. Leafy greens, including kale, chard, collard greens, and mustard greens. Beets. Cauliflower. Cabbage. Broccoli. Carrots. Green beans. Tomatoes. Peppers. Onions. Cucumbers. Brussels sprouts. Grains Whole grains, such as whole-wheat or whole-grain bread, crackers, tortillas, cereal, and pasta. Unsweetened oatmeal. Quinoa. Brown or wild rice. Meats and other proteins Seafood. Poultry without skin. Lean cuts of poultry and beef. Tofu. Nuts. Seeds. Dairy Low-fat or fat-free dairy products such as milk, yogurt, and cheese. The items listed above may not be a complete list of foods and beverages you can eat. Contact a dietitian for more information. What foods should I avoid? Fruits Fruits canned with  syrup. Vegetables Canned vegetables. Frozen vegetables with butter or cream sauce. Grains Refined white flour and flour products such as bread, pasta, snack foods, and cereals. Avoid all processed foods. Meats and other proteins Fatty cuts of meat. Poultry with skin. Breaded or fried meats. Processed meat. Avoid saturated fats. Dairy Full-fat yogurt, cheese, or milk. Beverages Sweetened drinks, such as soda or iced tea. The items listed above may not be a complete list of foods and beverages you should avoid. Contact a dietitian for more information. Questions to ask a health care provider  Do I need to meet with a diabetes educator?  Do I need to meet with a dietitian?  What number can I call if I have questions?  When are the best times to check my blood glucose? Where to find more information:  American Diabetes Association: diabetes.org  Academy of Nutrition and Dietetics: www.eatright.org  National Institute of Diabetes and Digestive and Kidney Diseases: www.niddk.nih.gov  Association of Diabetes Care and Education Specialists: www.diabeteseducator.org Summary  It is important to have healthy eating   habits because your blood sugar (glucose) levels are greatly affected by what you eat and drink.  A healthy meal plan will help you control your blood glucose and maintain a healthy lifestyle.  Your health care provider may recommend that you work with a dietitian to make a meal plan that is best for you.  Keep in mind that carbohydrates (carbs) and alcohol have immediate effects on your blood glucose levels. It is important to count carbs and to use alcohol carefully. This information is not intended to replace advice given to you by your health care provider. Make sure you discuss any questions you have with your health care provider. Document Revised: 04/23/2019 Document Reviewed: 04/23/2019 Elsevier Patient Education  2021 Elsevier Inc.  

## 2021-04-01 NOTE — Progress Notes (Signed)
Subjective:  Patient ID: Joseph [place] Speaker, male    DOB: 05-25-1946  Age: 75 y.o. MRN: [id number]  CC: Diabetes   HPI Joseph [name] is a 75 y.o. year old male with a history of type 2 diabetes mellitus (A1c 7.6), b/l BKA, hypertension, stage II CKD who presents today to establish care.  Interval History: A1c is 7.6 down from 9.4 and he endorses compliance with his medications.  He has had no hypoglycemic events. Compliant with his antihypertensive and is also doing well on his statin. Not up-to-date on annual eye exams.  Denies presence of neuropathy.  He is followed by Dr. Sharol Given with his last visit 2 weeks ago and he is being evaluated for a left leg prosthesis. He is otherwise doing well and has no additional concerns today. Past Medical History:  Diagnosis Date   Cellulitis of right lower extremity    Diabetes mellitus type 2 in nonobese [facility])    Gangrene of right foot ([place])    Hypertension    Open wound of right foot     Past Surgical History:  Procedure Laterality Date   ABDOMINAL AORTOGRAM W/LOWER EXTREMITY Left 01/27/2021   Procedure: ABDOMINAL AORTOGRAM W/LOWER EXTREMITY;  Surgeon: Cherre Robins, MD;  Location: [place] [facility];  Service: Cardiovascular;  Laterality: Left;   AMPUTATION Right 11/16/2018   Procedure: RIGHT BELOW KNEE AMPUTATION;  Surgeon: Newt Minion, MD;  Location: [place];  Service: Orthopedics;  Laterality: Right;   AMPUTATION Left 02/03/2021   Procedure: LEFT BELOW KNEE AMPUTATION;  Surgeon: Newt Minion, MD;  Location: [place];  Service: Orthopedics;  Laterality: Left;   APPLICATION OF WOUND VAC  02/03/2021   Procedure: APPLICATION OF WOUND VAC;  Surgeon: Newt Minion, MD;  Location: [place];  Service: Orthopedics;;   HAND SURGERY      Family History  Problem Relation Age of Onset   Diabetes Father    Cancer Neg Hx    CAD Neg Hx     No Known Allergies  Outpatient Medications Prior to Visit  Medication Sig Dispense Refill   Accu-Chek Softclix  Lancets lancets Use to check blood sugars three times per day 100 each 0   amLODipine (NORVASC) 10 MG tablet Take 1 tablet (10 mg total) by mouth daily. 30 tablet 0   aspirin EC 81 MG EC tablet Take 1 tablet (81 mg total) by mouth daily. Swallow whole. 30 tablet 11   atorvastatin (LIPITOR) 10 MG tablet Take 1 tablet (10 mg total) by mouth daily. In the evening 30 tablet 0   Blood Glucose Monitoring Suppl (ACCU-CHEK GUIDE) w/Device KIT Check blood sugar three times daily. 1 kit 0   glucose blood (ACCU-CHEK GUIDE) test strip Use as instructed 100 each 0   Insulin Glargine (BASAGLAR KWIKPEN) 100 UNIT/ML Inject 15 Units into the skin daily. 15 mL 3   Insulin Pen Needle 32G X 4 MM MISC Use 1 pen tip to inject insulin once daily. 100 each 1   Misc. Devices MISC leg extension and foot rest for the left side. 1 each 0   Multiple Vitamins-Minerals (ONE-A-DAY MENS 50+ ADVANTAGE) TABS Take 1 tablet by mouth daily with breakfast.     ascorbic acid (VITAMIN C) 1000 MG tablet Take 1 tablet (1,000 mg total) by mouth daily. (Patient not taking: Reported on 04/01/2021) 30 tablet 0   diclofenac Sodium (VOLTAREN) 1 % GEL Apply 2 g topically 3 (three) times daily. (Patient not taking: Reported on 04/01/2021) 200  g 1   ferrous sulfate (FEROSUL) 325 (65 FE) MG tablet Take 1 tablet (325 mg total) by mouth daily with breakfast. (Patient not taking: Reported on 04/01/2021) 30 tablet 0   oxyCODONE (OXY IR/ROXICODONE) 5 MG immediate release tablet Take 1 tablet (5 mg total) by mouth every 4 (four) hours as needed for moderate pain. (Patient not taking: Reported on 04/01/2021) 30 tablet 0   polyethylene glycol (MIRALAX / GLYCOLAX) 17 g packet Take 17 g by mouth daily as needed for mild constipation. (Patient not taking: Reported on 04/01/2021) 14 each 0   Zinc Sulfate 220 (50 Zn) MG TABS Take 1 tablet (220 mg total) by mouth daily. (Patient not taking: Reported on 04/01/2021) 30 tablet 0   No facility-administered medications prior  to visit.     ROS Review of Systems  Constitutional:  Negative for activity change and appetite change.  HENT:  Negative for sinus pressure and sore throat.   Eyes:  Negative for visual disturbance.  Respiratory:  Negative for cough, chest tightness and shortness of breath.   Cardiovascular:  Negative for chest pain and leg swelling.  Gastrointestinal:  Negative for abdominal distention, abdominal pain, constipation and diarrhea.  Endocrine: Negative.   Genitourinary:  Negative for dysuria.  Musculoskeletal:  Negative for joint swelling and myalgias.  Skin:  Negative for rash.  Allergic/Immunologic: Negative.   Neurological:  Negative for weakness, light-headedness and numbness.  Psychiatric/Behavioral:  Negative for dysphoric mood and suicidal ideas.    Objective:  BP 124/74   Pulse (!) 57   Ht 6' 3"  (1.905 m)   SpO2 99%   BMI 27.31 kg/m   BP/Weight 04/01/2021 03/10/2021 0/12/6759  Systolic BP 950 932 671  Diastolic BP 74 63 83  Wt. (Lbs) - - -  BMI 27.31 27.31 -      Physical Exam Constitutional:      Appearance: He is well-developed.  Cardiovascular:     Rate and Rhythm: Bradycardia present.     Heart sounds: Normal heart sounds. No murmur heard. Pulmonary:     Effort: Pulmonary effort is normal.     Breath sounds: Normal breath sounds. No wheezing or rales.  Chest:     Chest wall: No tenderness.  Abdominal:     General: Bowel sounds are normal. There is no distension.     Palpations: Abdomen is soft. There is no mass.     Tenderness: There is no abdominal tenderness.  Musculoskeletal:     Comments: Bilateral BKA  Neurological:     Mental Status: He is alert and oriented to person, place, and time.  Psychiatric:        Mood and Affect: Mood normal.    CMP Latest Ref Rng & Units 02/10/2021 02/05/2021 02/04/2021  Glucose 70 - 99 mg/dL 121(H) 123(H) 160(H)  BUN 8 - 23 mg/dL 31(H) 16 21  Creatinine 0.61 - 1.24 mg/dL 1.32(H) 1.24 1.30(H)  Sodium 135 - 145  mmol/L 133(L) 134(L) 133(L)  Potassium 3.5 - 5.1 mmol/L 4.3 3.9 4.9  Chloride 98 - 111 mmol/L 97(L) 102 102  CO2 22 - 32 mmol/L 28 25 24   Calcium 8.9 - 10.3 mg/dL 9.3 9.2 9.1  Total Protein 6.5 - 8.1 g/dL - 7.0 6.9  Total Bilirubin 0.3 - 1.2 mg/dL - 0.3 0.2(L)  Alkaline Phos 38 - 126 U/L - 69 63  AST 15 - 41 U/L - 58(H) 83(H)  ALT 0 - 44 U/L - 61(H) 59(H)    Lipid Panel  Component Value Date/Time   CHOL 121 12/30/2020 1107   TRIG 104 12/30/2020 1107   HDL 41 12/30/2020 1107   CHOLHDL 3.0 12/30/2020 1107   CHOLHDL 4.7 11/14/2018 0006   VLDL 9 11/14/2018 0006   LDLCALC 61 12/30/2020 1107    CBC    Component Value Date/Time   WBC 12.7 (H) 02/10/2021 0503   RBC 3.36 (L) 02/10/2021 0503   HGB 10.0 (L) 02/10/2021 0503   HGB 11.8 (L) 12/30/2020 1107   HCT 30.1 (L) 02/10/2021 0503   HCT 35.6 (L) 12/30/2020 1107   PLT 443 (H) 02/10/2021 0503   PLT 232 12/30/2020 1107   MCV 89.6 02/10/2021 0503   MCV 90 12/30/2020 1107   MCH 29.8 02/10/2021 0503   MCHC 33.2 02/10/2021 0503   RDW 12.7 02/10/2021 0503   RDW 12.7 12/30/2020 1107   LYMPHSABS 5.0 (H) 02/10/2021 0503   LYMPHSABS 4.1 (H) 12/30/2020 1107   MONOABS 0.5 02/10/2021 0503   EOSABS 0.4 02/10/2021 0503   EOSABS 0.3 12/30/2020 1107   BASOSABS 0.3 (H) 02/10/2021 0503   BASOSABS 0.1 12/30/2020 1107    Lab Results  Component Value Date   HGBA1C 7.6 (A) 04/01/2021    Assessment & Plan:  1. Type 2 diabetes mellitus with hyperglycemia, without long-term current use of insulin (HCC) Suboptimally controlled with A1c of 7.6 but this has trended down from 9.4; goal is less than 7.0 No regimen change today Counseled on Diabetic diet, my plate method, 440 minutes of moderate intensity exercise/week Blood sugar logs with fasting goals of 80-120 mg/dl, random of less than 180 and in the event of sugars less than 60 mg/dl or greater than 400 mg/dl encouraged to notify the clinic. Advised on the need for annual eye exams,  annual foot exams, Pneumonia vaccine. - POCT glucose (manual entry) - POCT glycosylated hemoglobin (Hb A1C) - atorvastatin (LIPITOR) 10 MG tablet; Take 1 tablet (10 mg total) by mouth daily. In the evening  Dispense: 90 tablet; Refill: 1 - Insulin Glargine (BASAGLAR KWIKPEN) 100 UNIT/ML; Inject 15 Units into the skin daily.  Dispense: 30 mL; Refill: 6 - glucose blood (ACCU-CHEK GUIDE) test strip; Use as instructed  Dispense: 100 each; Refill: 11 - Ambulatory referral to Ophthalmology  2. Hypertension associated with stage 2 chronic kidney disease due to type 2 diabetes mellitus ([place]) Controlled Continue amlodipine Counseled on blood pressure goal of less than 130/80, low-sodium, DASH diet, medication compliance, 150 minutes of moderate intensity exercise per week. Discussed medication compliance, adverse effects. - amLODipine (NORVASC) 10 MG tablet; Take 1 tablet (10 mg total) by mouth daily.  Dispense: 90 tablet; Refill: 1  3. Screening for colon cancer - Cologuard  4. S/P bilateral below knee amputation ([place]) Currently followed by orthopedics Working on obtaining left leg prosthesis  Declines flu shot and PCV 20  No orders of the defined types were placed in this encounter.   Return in about 3 months (around 07/02/2021) for Medical condition.       Charlott Rakes, MD, FAAFP. [facility] and [place], [place]   04/01/2021, 11:43 AM

## 2021-04-02 ENCOUNTER — Other Ambulatory Visit: Payer: Self-pay | Admitting: *Deleted

## 2021-04-02 NOTE — Patient Outreach (Signed)
[facility]) Care Management  04/02/2021  Joseph [name] Joseph [name] 06/06/1945 [id number]   [facility] outreach to complex care patient Joseph [name] was referred to [facility] on 10/08/20  Referral Reason: to be placed in complex, chronic or disease mgmt. Insurance: medicare part A & B only      He is able to verify HIPAA identifiers     Assessment He is doing very well and has made great progress with the healing, home management of his recent left transtibial amputation  s/p 6 weeks status post left transtibial amputation. Has cast made for new prosthesis, get new prosthesis on 04/07/21  Allowed him to ventilate his feelings and coping about being a double amputee He reports he is remaining positive, relying on his faith in God and surrounding himself with positive support system Depression screen [facility] 2/9 04/02/2021  Decreased Interest 0  Down, Depressed, Hopeless 0  PHQ - 2 Score 0  Altered sleeping -  Tired, decreased energy -  Change in appetite -  Feeling bad or failure about yourself  -  Trouble concentrating -  Moving slowly or fidgety/restless -  Suicidal thoughts -  PHQ-9 Score -  Difficult doing work/chores -    Vaccinations- a few in past caused increased symptoms He prefers precautions  Educated on colon home check  Care Plan : General Plan of Care (Adult)  Updates made by Barbaraann Faster, RN since 04/02/2021 12:00 AM     Problem: Health Promotion or Disease Self-Management (General Plan of Care) Resolved 04/02/2021  Priority: High  Onset Date: 10/20/2020     Long-Range Goal: Self-Management Plan Developed Completed 04/02/2021  Start Date: 10/20/2020  Expected End Date: 03/29/2021  This Visit's Progress: On track  Recent Progress: On track  Priority: High  Note:      Task: Mutually Develop and Royce [place]ia Achievement of Patient Goals Completed 04/02/2021  Due Date: 03/29/2021  Outcome: Positive  Responsible User: Barbaraann Faster, RN  Note:   Care Management  Activities 04/02/21 goals met 02/17/21 unsuccessful outreach 9/3 to 02/15/21 hospitalized s/p bilateral BKA 12/30/20 receiving resources for home utilities  11/18/20 assessed for f/u on resources to include vocational rehab for home improvements and received medicaid application confirm still plan to go to new pcp appointment on 12/02/20 he was encourage to get a voice mail box setup for all future calls, sent pat the [place] text message for access to my chart  11/03/20  Further assessment, reviewed SDOH progression, re assessed level of readiness, discussed advance directives encouraged update and giving copy to new pcp office mailed package Updated [facility] care guide for community resource for home back door ramp, inquired about vocational rehab and aging gracefully Spoke with [name] who also had outreached to patient on today  Shared 211 as a resource for united way resources  Outreached to [phone] vocational rehab, spoke with [employer], transferred to the independent living department staff, ericka taylor and left a voice message to include pt new number, [facility] RN CM number and request from pt for update on status for assistance Outreach to community housing solutions [phone] extension [place] D was left a message to include referral information, pt new number, [facility] RN CM number and request from pt referral for assistance [facility] staff absence) Mailed advance directive package, basic medicaid eligibility sheet and medicare fact sheet Education on differences in [place] and medicare services related to services, Different medicare parts, 24 hour nurse line service, advance directives, 211,  DSS  10/27/20 call from pt to update his mobile number completed in EPIC provided to care guide (in basket) and vocational rehab (LVM) He was again encouraged to outreach to his pcp office to scheduled a new patient appointment with a new pcp He voiced understanding Sent via mail EMMI education on  diabetes exchange  diet, getting the care you need and DASH diet- pt did schedule his new patient appointment with [facility] 10/22/20 Notes from [place], Care guide performed the following interventions: Patient provided with information about care guide support team and interviewed to confirm resource needs Placed referral to [facility] via email Spoke with patient about referral to [facility] on Wheels. Referral was emailed to [employer] at [employer].  Patient was shopping and unable to write down information for Voc[facility]hab Services for assistance with bathroom modifications. Left message on patient's  voicemail  with the number to call per patient request .  .   Follow Up Plan:  Care guide will follow up with patient by phone over the next day 10/20/20 called [place] to check on pcp Not assigned yet pt need to make new pt appt Had [facility] CMA send BP cuff Referral to [facility] care guide for community, financial meal on wheel resource   - barriers to meeting goals identified - change-talk evoked - choices provided - collaboration with team encouraged - decision-making supported - difficulty of making life-long changes acknowledged - health risks reviewed - problem-solving facilitated - questions answered - readiness for change evaluated - reassurance provided - resources needed to meet goals identified - self-reflection promoted - self-reliance encouraged - verbalization of feelings encouraged    Notes:     Care Plan : Diabetes Type 2 (Adult)  Updates made by Barbaraann Faster, RN since 04/02/2021 12:00 AM     Problem: Glycemic Management (Diabetes, Type 2) Resolved 04/02/2021  Priority: High  Onset Date: 10/20/2020     Goal: Glycemic Management Optimized Completed 04/02/2021  Start Date: 10/20/2020  Expected End Date: 02/26/2021  This Visit's Progress: On track  Recent Progress: On track  Priority: High  Note:      Task: Alleviate Barriers to Glycemic Management Completed 04/02/2021  Due  Date: 03/29/2021  Outcome: Positive  Responsible User: Barbaraann Faster, RN  Note:   Care Management Activities:  04/02/21 Resolving due to duplicate goal He received further cbg test strips from his pcp pharmacy on 04/01/21. He continues to check cbgs 2-3 times a day His recent HgA1c shows improvement of 7.6 (was 9.4) Reports home cbg values ranging 118-160 02/03/21 ED on 01/26/21 dx left great toe osteomyelitis 01/28/21 postponed recommended surgery per Dr Sharol Given. 01/30/21 return to ED after seen by Dr Sharol Given and admitted for 02/03/21 left below knee transtibial amputation 12/30/20 encouraged to work on diet and exercise to get improvement of 12/30/20 hga1c of 11.7 an increase from 9.8 on 02/06/20 answered questions and encouragement provided  11/03/20 Reviewed 9.8 hga1c on 02/06/20, assess home management encouraged good nutrition 10/27/20 Sent via mail EMMI education on  diabetes exchange diet, getting the care you need and DASH diet- pt did schedule his new patient appointment with [facility]  10/20/20 called [place] to check on pcp Not assigned yet pt need to make new pt appt Had [facility] CMA send BP cuff Referral to [facility] care guide for community, financial meal on wheel resource 10/20/20- barriers to adherence to treatment plan identified - blood glucose monitoring encouraged - blood glucose readings reviewed - dental  care encouraged - individualized medical nutrition therapy provided - mutual A1C goal set or reviewed - resources required to improve adherence to care identified - self-awareness of signs/symptoms of hypo or hyperglycemia encouraged - use of blood glucose monitoring log promoted    Notes:     Problem: Disease Progression (Diabetes, Type 2) Resolved 04/02/2021  Priority: High  Onset Date: 10/20/2020     Long-Range Goal: Disease Progression Prevented or Minimized Completed 04/02/2021  Start Date: 10/20/2020  Expected End Date: 04/28/2021  This Visit's Progress: On track  Recent Progress: On track   Priority: High  Note:      Task: Monitor and Manage Follow-up for Comorbidities Completed 04/02/2021  Due Date: 04/28/2021  Outcome: Positive  Responsible User: Barbaraann Faster, RN  Note:   Care Management Activities:  04/02/21 Resolving due to duplicate goal He received further cbg test strips from his pcp pharmacy on 04/01/21. He continues to check cbgs 2-3 times a day His recent HgA1c shows improvement of 7.6 (was 9.4) Reports home cbg values ranging 118-160 02/17/21 unsuccessful outreach 9/3 to 02/15/21 hospitalized s/p bilateral BKA 02/03/21 ED on 01/26/21 dx left great toe osteomyelitis 01/28/21 postponed recommended surgery per Dr Sharol Given. 01/30/21 return to ED after seen by Dr Sharol Given and admitted for 02/03/21 left below knee transtibial amputation 6/7/221 Reviewed 9.8 hga1c on 02/06/20, assess home management encouraged good nutrition now with meals on wheels deliveries  11/03/20  Further assessment, reviewed SDOH progression, re assessed level of readiness, discussed advance directives encouraged update and giving copy to new pcp office mailed package Updated [facility] care guide for community resource for home back door ramp, inquired about vocational rehab and aging gracefully Spoke with [place] who also had outreached to patient on today  Shared 211 as a resource for united way resources  Outreached to [phone] vocational rehab, spoke with [employer], transferred to the independent living department staff, ericka taylor and left a voice message to include pt new number, [facility] RN CM number and request from pt for update on status for assistance Outreach to community housing solutions [phone] extension [place] D was left a message to include referral information, pt new number, [facility] RN CM number and request from pt referral for assistance [facility] staff absence) Mailed advance directive package, basic medicaid eligibility sheet and medicare fact sheet Education on differences in [place] and medicare services  related to services, Different medicare parts, 24 hour nurse line service, advance directives, 211, DSS 10/27/20 Sent via mail EMMI education on  diabetes exchange diet, getting the care you need and DASH diet- pt did schedule his new patient appointment with [facility]  10/20/20 called [place] to check on pcp Not assigned yet pt need to make new pt appt Had [facility] CMA send BP cuff Referral to [facility] care guide for community, financial meal on wheel resource 10/20/20 - activity based on tolerance and functional limitations encouraged - healthy lifestyle promoted - quality of sleep assessed - reduction of sedentary activity encouraged - signs/symptoms of comorbidities identified - vital signs and trends reviewed    Notes:     Care Plan : [place] of Care  Updates made by Barbaraann Faster, RN since 04/02/2021 12:00 AM     Problem: Complex Care Coordination Needs and disease management in patient with Diabetes, HTN, CKD, CHF   Priority: High     Long-Range Goal: Patient-Specific Goal   Start Date: 04/02/2021  Priority: High  Note:   Current  Barriers:  Knowledge Deficits related to plan of care for management of CHF, HTN, DMII, CKD Stage II, and Bilateral below knee amputation Care Coordination needs related to Limited access to food, ADL IADL limitations, Limited education about CHF, DM, HTN, CKD*, and Lacks knowledge of community resource: [employer], Meals on wheels, vocational rehab, DSS  RNCM Clinical Goal(s):  Patient will verbalize understanding of plan for management of CHF, HTN, DMII, CKD Stage II, and bilateral below knee amputation work with the resources: [employer], vocational rehab, DSS to get home improvements, resources and services need  through collaboration with Consulting civil engineer, provider, and care team.   Interventions: Encouragement of patient to outreach to RN CM or 24 hr RN center as needed Inter-disciplinary care team collaboration (see longitudinal plan of care) Evaluation of  current treatment plan related to  self management and patient's adherence to plan as established by provider  Hypertension Interventions: Last practice recorded BP readings:  BP Readings from Last 3 Encounters:  04/01/21 124/74  03/10/21 112/63  02/15/21 130/83  Most recent eGFR/CrCl:  Lab Results  Component Value Date   EGFR 43 (L) 12/30/2020    No components found for: CRCL  Evaluation of current treatment plan related to hypertension self management and patient's adherence to plan as established by provider;  Diabetes Interventions: Assessed patient's understanding of A1c goal: <7% Reviewed scheduled/upcoming provider appointments including: f/u for new prosthesis s/p 6 weeks left transtibial amputation get new left prosthesis on 04/07/21 per pt ; Review of patient status, including review of consultants reports, relevant laboratory and other test results, and medications completed; Screening for signs and symptoms of depression related to chronic disease state;  Assessed social determinant of health barriers;  Encouragement provided for the hard work in home management of diet, activity and monitoring of cbg values 2-3 times a day since last admission to assist in decreasing his A1c to 7.6  Lab Results  Component Value Date   HGBA1C 7.6 (A) 04/01/2021     Chronic Kidney Disease Interventions:  (Status:  Goal on track:  Yes Assessed the Patient understanding of chronic kidney disease    Last practice recorded BP readings:  BP Readings from Last 3 Encounters:  04/01/21 124/74  03/10/21 112/63  02/15/21 130/83  Most recent eGFR/CrCl:  Lab Results  Component Value Date   EGFR 43 (L) 12/30/2020    No components found for: CRCL   Heart Failure Interventions: Provided education on low sodium diet; Inquired about the last completed weight & possible value prior to left AKA admission. He confirms no recent weights, believes weigh during last admission, estimates wt at 223-228  lbs. He reports he is remaining as active as possible. No weight completed at 04/01/21 pcp visit (bilateral amputee)  Patient Goals/Self-Care Activities: Patient will self administer medications as prescribed Patient will attend all scheduled provider appointments Patient will call pharmacy for medication refills Patient will continue to perform ADL's independently Patient will call provider office for new concerns or questions Patient has outreached to [facility] to complete the initial review of his medications and been offered 5 available plans meeting his needs for Medicare part C/D. He reports he will further inquire about changes in his premiums if he chooses 1 of the 5 plus continue to work with pcp RN CM and pcp pharmacy staff at [facility])   Follow Up Plan:  The patient has been provided with contact information for the care management team and has been advised  to call with any health related questions or concerns.  The care management team will reach out to the patient again over the next 30 business days.      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)    Leukocytosis    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[place]) 11/20/2018   Paroxysmal atrial fibrillation (HCC)    Diabetic polyneuropathy associated with type 2 diabetes mellitus (HCC)    PVD (peripheral vascular disease) ([place])    Critical lower  limb ischemia ([place])    Sepsis with acute renal failure ([place]) 11/13/2018   Acute renal failure superimposed on stage 2 chronic kidney disease ([place])    'light-for-dates' infant with signs of fetal malnutrition 10/17/2018   DM (diabetes mellitus) ([place]) 07/02/2012   Hypertension associated with diabetes ([place]) 07/02/2012    Laquan Beier L. Lavina Hamman, RN, BSN, [place] Coordinator Office number [phone] Mobile number [phone]  Main [facility] number [id number] Fax number [phone]

## 2021-04-15 ENCOUNTER — Ambulatory Visit (INDEPENDENT_AMBULATORY_CARE_PROVIDER_SITE_OTHER): Payer: Medicare Other | Admitting: Orthopedic Surgery

## 2021-04-15 DIAGNOSIS — Z89512 Acquired absence of left leg below knee: Secondary | ICD-10-CM

## 2021-05-03 ENCOUNTER — Other Ambulatory Visit: Payer: Self-pay

## 2021-05-03 ENCOUNTER — Encounter: Payer: Self-pay | Admitting: Orthopedic Surgery

## 2021-05-03 ENCOUNTER — Other Ambulatory Visit: Payer: Self-pay | Admitting: *Deleted

## 2021-05-03 NOTE — Patient Outreach (Signed)
[facility]) Care Management Telephonic RN Care Manager Note   05/03/2021 Name:  [name] Criscione MRN:  [id number] DOB:  1946-05-17  Summary: Follow up outreach to Mr Tonkinson He reports he is doing well He is getting use to walking on his new prosthesis with upcoming therapies  Recommendations/Changes made from today's visit: Encouraged him to follow up with DSS and his pharmacy after not being able to get anyone on the line during the call attempts Encouraged follow up with his local garbage disposal company and senior services of [place]    Subjective: [name] Todisco is an 75 y.o. year old male who is a primary patient of Charlott Rakes, MD. The care management team was consulted for assistance with care management and/or care coordination needs.    Telephonic RN Care Manager completed Telephone Visit today.   Objective:  Medications Reviewed Today     Reviewed by Barbaraann Faster, RN (Registered Nurse) on 05/03/21 at 1311  Med List Status: <None>   Medication Order Taking? Sig Documenting Provider Last Dose Status Informant  Accu-Chek Softclix Lancets lancets [id number] No Use to check blood sugars three times per day Cathlyn Parsons, PA-C Taking Active   amLODipine (NORVASC) 10 MG tablet [id number]  Take 1 tablet (10 mg total) by mouth daily. Charlott Rakes, MD  Active   ascorbic acid (VITAMIN C) 1000 MG tablet [id number] No Take 1 tablet (1,000 mg total) by mouth daily.  Patient not taking: Reported on 04/01/2021   Cathlyn Parsons, PA-C Not Taking Active   aspirin EC 81 MG EC tablet [id number] No Take 1 tablet (81 mg total) by mouth daily. Swallow whole. Cathlyn Parsons, PA-C Taking Active   atorvastatin (LIPITOR) 10 MG tablet [id number]  Take 1 tablet (10 mg total) by mouth daily. In the evening Charlott Rakes, MD  Active   Blood Glucose Monitoring Suppl (ACCU-CHEK GUIDE) w/Device KIT [id number] No Check blood sugar three times daily. Charlott Rakes, MD Taking  Active   diclofenac Sodium (VOLTAREN) 1 % GEL [id number] No Apply 2 g topically 3 (three) times daily.  Patient not taking: Reported on 04/01/2021   Cathlyn Parsons, PA-C Not Taking Active   ferrous sulfate (FEROSUL) 325 (65 FE) MG tablet [id number] No Take 1 tablet (325 mg total) by mouth daily with breakfast.  Patient not taking: Reported on 04/01/2021   Charlott Rakes, MD Not Taking Active   glucose blood (ACCU-CHEK GUIDE) test strip [id number]  Use as instructed Charlott Rakes, MD  Active   Insulin Glargine (BASAGLAR KWIKPEN) 100 UNIT/ML [id number]  Inject 15 Units into the skin daily. Charlott Rakes, MD  Active   Insulin Pen Needle 32G X 4 MM MISC [id number] No Use 1 pen tip to inject insulin once daily. Cathlyn Parsons, PA-C Taking Active   Misc. Devices MISC [id number] No leg extension and foot rest for the left side. Charlott Rakes, MD Taking Active   Multiple Vitamins-Minerals (ONE-A-DAY MENS 50+ ADVANTAGE) TABS [id number] No Take 1 tablet by mouth daily with breakfast. [provider] Taking Active Self  oxyCODONE (OXY IR/ROXICODONE) 5 MG immediate release tablet [id number] No Take 1 tablet (5 mg total) by mouth every 4 (four) hours as needed for moderate pain.  Patient not taking: Reported on 04/01/2021   Newt Minion, MD Not Taking Active   polyethylene glycol (MIRALAX / GLYCOLAX) 17 g packet [id number] No Take 17 g by mouth daily as needed for mild constipation.  Patient not taking: Reported on 04/01/2021  Georgette Shell, MD Not Taking Active   Zinc Sulfate 220 (50 Zn) MG TABS [id number] No Take 1 tablet (220 mg total) by mouth daily.  Patient not taking: Reported on 04/01/2021   Cathlyn Parsons, PA-C Not Taking Active              SDOH:  (Social Determinants of Health) assessments and interventions performed:  SDOH Interventions    Flowsheet Row Most Recent Value  SDOH Interventions   Intimate Partner Violence Interventions Intervention Not Indicated   Transportation Interventions Intervention Not Indicated       Care Plan  Review of patient past medical history, allergies, medications, health status, including review of consultants reports, laboratory and other test data, was performed as part of comprehensive evaluation for care management services.   Care Plan : RN Care Manager Plan of Care  Updates made by Barbaraann Faster, RN since 05/03/2021 12:00 AM     Problem: Complex Care Coordination Needs and disease management in patient with Diabetes, HTN, CKD, CHF   Priority: High     Long-Range Goal: Patient-Specific Goal   Start Date: 04/02/2021  Priority: High  Note:   Current Barriers:  Knowledge Deficits related to plan of care for management of CHF, HTN, DMII, CKD Stage II, and Bilateral below knee amputation Care Coordination needs related to Limited access to food, ADL IADL limitations, Limited education about CHF, DM, HTN, CKD*, and Lacks knowledge of community resource: Tax adviser, Water engineer, Meals on wheels, DSS He inquires further about assistance around the home, care giver services He has sent in medicaid application-pending He only has medicare part [place] a statement from [place] 04/27/21, spoke with a representative once last week but has not followed up this week telephonically He confirms he will not be able to pay out of pocket for personal care services Renew prescriptions pending at his local pharmacy   RN CM Clinical Goal(s):  Patient will verbalize understanding of plan for management of CHF, HTN, DMII, CKD Stage II, and bilateral below knee amputation work with the resources: Development worker, community of [place], vocational rehab, DSS to get home improvements, resources and services need  through collaboration with Consulting civil engineer, provider, and care team.   Interventions: Encouragement of patient to outreach to RN CM or 24 hr RN center as  needed Inter-disciplinary care team collaboration (see longitudinal plan of care) Evaluation of current treatment plan related to  self management and patient's adherence to plan as established by provider Reviewed upcoming follow up visit with Dr Sharol Given on 05/13/21  Conference outreach with patient to DSS to check on his medicaid status for personal care services Conference with patient to his outpatient pharmacy [phone]  Conference with patient to [employer] [facility] [place] & spoke with [place] at [phone] [place] to assist with a sending a referral to [place] him [phone] to assist him with getting local [employer] door to door assistance  Hypertension Interventions: Last practice recorded BP readings:  BP Readings from Last 3 Encounters:  04/01/21 124/74  03/10/21 112/63  02/15/21 130/83  Most recent eGFR/CrCl:  Lab Results  Component Value Date   EGFR 43 (L) 12/30/2020    No components found for: CRCL  Evaluation of current treatment plan related to hypertension self management and patient's adherence to plan as established by provider;  Diabetes Interventions: Assessed patient's understanding of A1c goal: <7% Reviewed scheduled/upcoming provider  appointments including: f/u for new prosthesis s/p 6 weeks left transtibial amputation get new left prosthesis on 04/07/21 per pt ; Review of patient status, including review of consultants reports, relevant laboratory and other test results, and medications completed; Screening for signs and symptoms of depression related to chronic disease state;  Assessed social determinant of health barriers;  Encouragement provided for the hard work in home management of diet, activity and monitoring of cbg values 2-3 times a day since last admission to assist in decreasing his A1c to 7.6  Lab Results  Component Value Date   HGBA1C 7.6 (A) 04/01/2021     Chronic Kidney Disease Interventions:  (Status:  Goal on track:   Yes Assessed the Patient understanding of chronic kidney disease    Last practice recorded BP readings:  BP Readings from Last 3 Encounters:  04/01/21 124/74  03/10/21 112/63  02/15/21 130/83  Most recent eGFR/CrCl:  Lab Results  Component Value Date   EGFR 43 (L) 12/30/2020    No components found for: CRCL   Heart Failure Interventions: Provided education on low sodium diet; Inquired about the last completed weight & possible value prior to left AKA admission. He confirms no recent weights, believes weigh during last admission, estimates wt at 223-228 lbs. He reports he is remaining as active as possible. No weight completed at 04/01/21 pcp visit (bilateral amputee)  Patient Goals/Self-Care Activities: Patient will self administer medications as prescribed Patient will attend all scheduled provider appointments Patient will call pharmacy for medication refills Patient will continue to perform ADL's independently Patient will call provider office for new concerns or questions Patient will continue to work with pcp RN CM and pcp pharmacy staff at [facility]) Patient to follow up with DSS, vocational rehab, senior resources of [place], local residential garbage disposal services    Follow Up Plan:  The patient has been provided with contact information for the care management team and has been advised to call with any health related questions or concerns.  The care management team will reach out to the patient again over the next 30 business days.        Vici Novick L. Lavina Hamman, RN, BSN, [place] Coordinator Office number [phone] [facility] number [phone] Fax number [phone]

## 2021-05-03 NOTE — Progress Notes (Signed)
Office Visit Note   Patient: Joseph [name]           Date of Birth: Jun 20, 1945           MRN: [id number] Visit Date: 04/15/2021              Requested by: Charlott Rakes, MD [place],  [place] [id number] PCP: Charlott Rakes, MD  Chief Complaint  Patient presents with   Left Leg - Routine Post Op    02/03/21 left BKA       HPI: Patient is a 75 year old gentleman who is 9 weeks status post left transtibial amputation is currently working with Museum/gallery curator.  Assessment & Plan: Visit Diagnoses:  1. Hx of BKA, left ([place])     Plan: We will order physical therapy today.  Follow-Up Instructions: Return in about 4 weeks (around 05/13/2021).   Ortho Exam  Patient is alert, oriented, no adenopathy, well-dressed, normal affect, normal respiratory effort. Examination patient has a small ulcer over the residual limb with 5 mm of fibrinous tissue.  Patient does have a rash secondary to sweating and he was given a short compression stocking to wear.  Imaging: No results found. No images are attached to the encounter.  Labs: Lab Results  Component Value Date   HGBA1C 7.6 (A) 04/01/2021   HGBA1C 9.4 (H) 01/26/2021   HGBA1C 11.7 (A) 12/30/2020   ESRSEDRATE 120 (H) 01/30/2021   CRP 14.5 (H) 01/30/2021   REPTSTATUS 02/04/2021 FINAL 01/30/2021   REPTSTATUS 02/04/2021 FINAL 01/30/2021   CULT  01/30/2021    NO GROWTH 5 DAYS Performed at [facility], [place] [address] [place], [place] [address]    CULT  01/30/2021    NO GROWTH 5 DAYS Performed at [facility] [address], [place], [place] [address]      Lab Results  Component Value Date   ALBUMIN 2.5 (L) 02/05/2021   ALBUMIN 2.5 (L) 02/04/2021   ALBUMIN 3.1 (L) 01/30/2021    Lab Results  Component Value Date   MG 1.8 02/04/2021   MG 1.7 02/03/2021   MG 2.0 11/15/2018   No results found for: VD25OH  No results found for: PREALBUMIN CBC EXTENDED Latest Ref Rng & Units 02/10/2021 02/05/2021  02/04/2021  WBC 4.0 - 10.5 K/uL 12.7(H) 15.3(H) 16.4(H)  RBC 4.22 - 5.81 MIL/uL 3.36(L) 3.13(L) 3.09(L)  HGB 13.0 - 17.0 g/dL 10.0(L) 9.4(L) 9.3(L)  HCT 39.0 - 52.0 % 30.1(L) 28.6(L) 28.6(L)  PLT 150 - 400 K/uL 443(H) 381 398  NEUTROABS 1.7 - 7.7 K/uL 6.6 11.2(H) 10.2(H)  LYMPHSABS 0.7 - 4.0 K/uL 5.0(H) 3.2 5.6(H)     There is no height or weight on file to calculate BMI.  Orders:  Orders Placed This Encounter  Procedures   Ambulatory referral to Physical Therapy   No orders of the defined types were placed in this encounter.    Procedures: No procedures performed  Clinical Data: No additional findings.  ROS:  All other systems negative, except as noted in the HPI. Review of Systems  Objective: Vital Signs: There were no vitals taken for this visit.  Specialty Comments:  No specialty comments available.  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)    Leukocytosis    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HCC) 11/20/2018   Paroxysmal atrial fibrillation (HCC)    Diabetic polyneuropathy associated with type 2 diabetes mellitus (HCC)    PVD (peripheral vascular disease) (HCC)    Critical lower limb ischemia (HCC)    Sepsis with acute renal failure (HCC) 11/13/2018   Acute renal failure superimposed on stage 2 chronic kidney disease (HCC)    'light-for-dates' infant with signs of fetal malnutrition  10/17/2018   DM (diabetes mellitus) (HCC) 07/02/2012   Hypertension associated with diabetes (HCC) 07/02/2012   Past Medical History:  Diagnosis Date   Cellulitis of right lower extremity    Diabetes mellitus type 2 in nonobese (HCC)    Gangrene of right foot (HCC)    Hypertension    Open wound of right foot     Family History  Problem Relation Age of Onset   Diabetes Father    Cancer Neg Hx    CAD Neg Hx     Past Surgical History:  Procedure Laterality Date   ABDOMINAL AORTOGRAM W/LOWER EXTREMITY Left 01/27/2021   Procedure: ABDOMINAL AORTOGRAM W/LOWER EXTREMITY;  Surgeon: Leonie Douglas, MD;  [facility] INV[facility];  Service: Cardiovascular;  Laterality: Left;   AMPUTATION Right 11/16/2018   Procedure: RIGHT BELOW KNEE AMPUTATION;  Surgeon: [name], MD;  Location: [facility] OR;  Service: Orthopedics;  Laterality: Right;   AMPUTATION Left 02/03/2021   Procedure: LEFT BELOW KNEE AMPUTATION;  Surgeon: [name], MD;  Location: [facility] OR;  Service: Orthopedics;  Laterality: Left;   APPLICATION OF WOUND VAC  02/03/2021   Procedure: APPLICATION OF WOUND VAC;  Surgeon: [name], MD;  Location: [facility] OR;  Service: Orthopedics;;   HAND SURGERY     Social History   Occupational History   Occupation: retired     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

## 2021-05-04 ENCOUNTER — Other Ambulatory Visit: Payer: Self-pay | Admitting: Family Medicine

## 2021-05-04 ENCOUNTER — Other Ambulatory Visit: Payer: Self-pay

## 2021-05-04 NOTE — Telephone Encounter (Signed)
Requested medications are due for refill today.  yes  Requested medications are on the active medications list.  yes  Last refill. 03/04/2021  Future visit scheduled.   no  Notes to clinic.  Pt reports not taking this medication at office visit. Failed protocol d/t abnormal labs.    Requested Prescriptions  Pending Prescriptions Disp Refills   ferrous sulfate (FEROSUL) 325 (65 FE) MG tablet 30 tablet 0    Sig: Take 1 tablet (325 mg total) by mouth daily with breakfast.     Endocrinology:  Minerals - Iron Supplementation Failed - 05/04/2021  4:15 PM      Failed - HGB in normal range and within 360 days    Hemoglobin  Date Value Ref Range Status  02/10/2021 10.0 (L) 13.0 - 17.0 g/dL Final  93/26/7124 58.0 (L) 13.0 - 17.7 g/dL Final          Failed - HCT in normal range and within 360 days    HCT  Date Value Ref Range Status  02/10/2021 30.1 (L) 39.0 - 52.0 % Final   Hematocrit  Date Value Ref Range Status  12/30/2020 35.6 (L) 37.5 - 51.0 % Final          Failed - RBC in normal range and within 360 days    RBC  Date Value Ref Range Status  02/10/2021 3.36 (L) 4.22 - 5.81 MIL/uL Final          Failed - Fe (serum) in normal range and within 360 days    Iron  Date Value Ref Range Status  02/04/2021 15 (L) 45 - 182 ug/dL Final   Saturation Ratios  Date Value Ref Range Status  02/04/2021 7 (L) 17.9 - 39.5 % Final          Failed - Ferritin in normal range and within 360 days    Ferritin  Date Value Ref Range Status  02/04/2021 465 (H) 24 - 336 ng/mL Final    Comment:    Performed at [facility], [address], [place], [place] [address]          Passed - Valid encounter within last 12 months    Recent Outpatient Visits           1 month ago Type 2 diabetes mellitus with hyperglycemia, without long-term current use of insulin (HCC)   [facility] [place], [place], MD   4 months ago Type 2 diabetes mellitus with  hyperglycemia, without long-term current use of insulin [facility])   [facility] [place], [place], [place]   5 months ago Type 2 diabetes mellitus with hyperglycemia, without long-term current use of insulin [facility])   [facility] [place], [place], [place]   1 year ago Type 2 diabetes mellitus with hyperglycemia, without long-term current use of insulin [facility])   [facility] [place], [place], [place]   1 year ago Stage 3b chronic kidney disease   [facility] Cain Saupe, MD       Future Appointments             In 1 week [name], MD [facility]

## 2021-05-05 ENCOUNTER — Other Ambulatory Visit: Payer: Self-pay

## 2021-05-05 MED ORDER — FERROUS SULFATE 325 (65 FE) MG PO TABS
325.0000 mg | ORAL_TABLET | Freq: Every day | ORAL | 2 refills | Status: DC
  Filled 2021-05-05 – 2021-07-19 (×2): qty 30, 30d supply, fill #0
  Filled 2021-08-18: qty 30, 30d supply, fill #1

## 2021-05-06 ENCOUNTER — Encounter: Payer: Medicare Other | Admitting: Physical Therapy

## 2021-05-06 ENCOUNTER — Other Ambulatory Visit: Payer: Self-pay

## 2021-05-07 ENCOUNTER — Other Ambulatory Visit: Payer: Self-pay

## 2021-05-13 ENCOUNTER — Ambulatory Visit (INDEPENDENT_AMBULATORY_CARE_PROVIDER_SITE_OTHER): Payer: Medicare Other | Admitting: Orthopedic Surgery

## 2021-05-13 ENCOUNTER — Encounter: Payer: Self-pay | Admitting: Orthopedic Surgery

## 2021-05-13 DIAGNOSIS — Z89512 Acquired absence of left leg below knee: Secondary | ICD-10-CM

## 2021-05-13 DIAGNOSIS — Z89511 Acquired absence of right leg below knee: Secondary | ICD-10-CM | POA: Diagnosis not present

## 2021-05-13 NOTE — Progress Notes (Signed)
Office Visit Note   Patient: Joseph [name]           Date of Birth: 29-May-1946           MRN: [id number] Visit Date: 05/13/2021              Requested by: Hoy Register, MD [address] [place],  [place] [address] PCP: Hoy Register, MD  Chief Complaint  Patient presents with   Left Leg - Follow-up    S/p Left BKA 02/03/21      HPI: Patient is a 75 year old gentleman who presents in follow-up just over 3 months out from a left below-knee amputation.  Patient states he has not needed therapy yet he has no concerns he states he is independent with his activities of daily living.  Assessment & Plan: Visit Diagnoses:  1. Hx of BKA, left (HCC)   2. Acquired absence of right lower extremity below knee [facility])     Plan: We will reevaluate in 3 months.  Continue with the prosthetic under liner this is helping to heal the end bearing ulcer.  Follow-Up Instructions: Return in about 3 months (around 08/11/2021).   Ortho Exam  Patient is alert, oriented, no adenopathy, well-dressed, normal affect, normal respiratory effort. Examination of the residual limb is consolidating nicely there is 1 small ulcer 3 mm in diameter over the tip of the residual limb that is improving.  There is no redness no cellulitis no tenderness to palpation.  Imaging: No results found.   Labs: Lab Results  Component Value Date   HGBA1C 7.6 (A) 04/01/2021   HGBA1C 9.4 (H) 01/26/2021   HGBA1C 11.7 (A) 12/30/2020   ESRSEDRATE 120 (H) 01/30/2021   CRP 14.5 (H) 01/30/2021   REPTSTATUS 02/04/2021 FINAL 01/30/2021   REPTSTATUS 02/04/2021 FINAL 01/30/2021   CULT  01/30/2021    NO GROWTH 5 DAYS Performed at [facility], [address], [place], [place] [address]    CULT  01/30/2021    NO GROWTH 5 DAYS Performed at [facility], [address] [place], [place] [address]      Lab Results  Component Value Date   ALBUMIN 2.5 (L) 02/05/2021   ALBUMIN 2.5 (L) 02/04/2021   ALBUMIN 3.1  (L) 01/30/2021    Lab Results  Component Value Date   MG 1.8 02/04/2021   MG 1.7 02/03/2021   MG 2.0 11/15/2018   No results found for: VD25OH  No results found for: PREALBUMIN CBC EXTENDED Latest Ref Rng & Units 02/10/2021 02/05/2021 02/04/2021  WBC 4.0 - 10.5 K/uL 12.7(H) 15.3(H) 16.4(H)  RBC 4.22 - 5.81 MIL/uL 3.36(L) 3.13(L) 3.09(L)  HGB 13.0 - 17.0 g/dL 10.0(L) 9.4(L) 9.3(L)  HCT 39.0 - 52.0 % 30.1(L) 28.6(L) 28.6(L)  PLT 150 - 400 K/uL 443(H) 381 398  NEUTROABS 1.7 - 7.7 K/uL 6.6 11.2(H) 10.2(H)  LYMPHSABS 0.7 - 4.0 K/uL 5.0(H) 3.2 5.6(H)     There is no height or weight on file to calculate BMI.  Orders:  No orders of the defined types were placed in this encounter.  No orders of the defined types were placed in this encounter.    Procedures: No procedures performed  Clinical Data: No additional findings.  ROS:  All other systems negative, except as noted in the HPI. Review of Systems  Objective: Vital Signs: There were no vitals taken for this visit.  Specialty Comments:  No specialty comments available.  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)    Leukocytosis    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[place]) 11/20/2018   Paroxysmal atrial fibrillation (HCC)    Diabetic polyneuropathy associated with type 2 diabetes mellitus (HCC)    PVD (peripheral vascular disease) (HCC)     Critical lower limb ischemia (HCC)    Sepsis with acute renal failure ([place]) 11/13/2018   Acute renal failure superimposed on stage 2 chronic kidney disease ([place])    'light-for-dates' infant with signs of fetal malnutrition 10/17/2018   DM (diabetes mellitus) ([place]) 07/02/2012   Hypertension associated with diabetes ([place]) 07/02/2012   Past Medical History:  Diagnosis Date   Cellulitis of right lower extremity    Diabetes mellitus type 2 in nonobese (HCC)    Gangrene of right foot ([place])    Hypertension    Open wound of right foot     Family History  Problem Relation Age of Onset   Diabetes Father    Cancer Neg Hx    CAD Neg Hx     Past Surgical History:  Procedure Laterality Date   ABDOMINAL AORTOGRAM W/LOWER EXTREMITY Left 01/27/2021   Procedure: ABDOMINAL AORTOGRAM W/LOWER EXTREMITY;  Surgeon: Cherre Robins, MD;  Location: [facility] LAB;  Service: Cardiovascular;  Laterality: Left;   AMPUTATION Right 11/16/2018   Procedure: RIGHT BELOW KNEE AMPUTATION;  Surgeon: Newt Minion, MD;  Location: [place];  Service: Orthopedics;  Laterality: Right;   AMPUTATION Left 02/03/2021   Procedure: LEFT BELOW KNEE AMPUTATION;  Surgeon: Newt Minion, MD;  Location: [facility] [place];  Service: Orthopedics;  Laterality: Left;   APPLICATION OF WOUND VAC  02/03/2021   Procedure: APPLICATION OF WOUND VAC;  Surgeon: Newt Minion, MD;  Location: [facility];  Service: Orthopedics;;   HAND SURGERY     Social History   Occupational History   Occupation: retired     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

## 2021-05-18 ENCOUNTER — Encounter: Payer: Medicare Other | Admitting: Physical Therapy

## 2021-06-03 ENCOUNTER — Other Ambulatory Visit: Payer: Self-pay

## 2021-06-03 ENCOUNTER — Encounter: Payer: Self-pay | Admitting: *Deleted

## 2021-06-03 ENCOUNTER — Other Ambulatory Visit: Payer: Self-pay | Admitting: *Deleted

## 2021-06-03 NOTE — Patient Outreach (Signed)
[facility]) Care Management  06/03/2021  Joseph [name] 1945/09/15 [id number]  Referral received from Edd Arbour, RN for Care Management services to assess for further quarterly education and support in managing DM, HTN, CHF, CKD.  Assigned to Gean Maidens, RN Care Coordinator.

## 2021-06-03 NOTE — Patient Outreach (Signed)
[facility]) Care Management Telephonic RN Care Manager Note   06/03/2021 Name:  Joseph Guiana Labombard MRN:  [id number] DOB:  10-26-1945  Summary: Allowed him time to ventilate  He reports he is doing fine He reports his leg is healing well -s/p bilateral below knee amputation He reports he is doing well so far with social determinants of health (SDOH) for 2023 He did not get a ramp or meals on wheels from 2022 He was denied for medicaid in 2022 related to vehicles he owned He reports having a discussion with DSS about no longer owning the vehicles in question He is walking now and will be having someone (? Girlfriend) to stay with him in 2023 and will be able to complete more independent ADLs & iADls His finances are getting better DM/HTN managed with diet  Recommendations/Changes made from today's visit: [facility]N progression discussed  He reports he is doing better than in 2022 and agrees to transfer to [facility] diease management services  He is open to returning a call to RN CM prn  Sent patient my chart text invitation  Subjective: Joseph [name] is an 76 y.o. year old male who is a primary patient of Charlott Rakes, MD. The care management team was consulted for assistance with care management and/or care coordination needs.    Telephonic RN Care Manager completed Telephone Visit today.   Objective:  Medications Reviewed Today     Reviewed by Newt Minion, MD (Physician) on 05/13/21 at 1214  Med List Status: <None>   Medication Order Taking? Sig Documenting Provider Last Dose Status Informant  Accu-Chek Softclix Lancets lancets [id number] No Use to check blood sugars three times per day Cathlyn Parsons, PA-C Taking Active   amLODipine (NORVASC) 10 MG tablet [id number]  Take 1 tablet (10 mg total) by mouth daily. Charlott Rakes, MD  Active   ascorbic acid (VITAMIN C) 1000 MG tablet [id number] No Take 1 tablet (1,000 mg total) by mouth daily.  Patient not taking: Reported on  04/01/2021   Cathlyn Parsons, PA-C Not Taking Active   aspirin EC 81 MG EC tablet [id number] No Take 1 tablet (81 mg total) by mouth daily. Swallow whole. Cathlyn Parsons, PA-C Taking Active   atorvastatin (LIPITOR) 10 MG tablet [id number]  Take 1 tablet (10 mg total) by mouth daily. In the evening Charlott Rakes, MD  Active   Blood Glucose Monitoring Suppl (ACCU-CHEK GUIDE) w/Device KIT [id number] No Check blood sugar three times daily. Charlott Rakes, MD Taking Active   diclofenac Sodium (VOLTAREN) 1 % GEL [id number] No Apply 2 g topically 3 (three) times daily.  Patient not taking: Reported on 04/01/2021   Cathlyn Parsons, PA-C Not Taking Active   ferrous sulfate (FEROSUL) 325 (65 FE) MG tablet [id number]  Take 1 tablet (325 mg total) by mouth daily with breakfast. Charlott Rakes, MD  Active   glucose blood (ACCU-CHEK GUIDE) test strip [id number]  Use as instructed Charlott Rakes, MD  Active   Insulin Glargine (BASAGLAR KWIKPEN) 100 UNIT/ML [id number]  Inject 15 Units into the skin daily. Charlott Rakes, MD  Active   Insulin Pen Needle 32G X 4 MM MISC [id number] No Use 1 pen tip to inject insulin once daily. Cathlyn Parsons, PA-C Taking Active   Misc. Devices MISC [id number] No leg extension and foot rest for the left side. Charlott Rakes, MD Taking Active   Multiple Vitamins-Minerals (ONE-A-DAY MENS 50+ ADVANTAGE) TABS [id number] No Take 1 tablet by mouth daily with  breakfast. [provider] Taking Active Self  oxyCODONE (OXY IR/ROXICODONE) 5 MG immediate release tablet [id number] No Take 1 tablet (5 mg total) by mouth every 4 (four) hours as needed for moderate pain.  Patient not taking: Reported on 04/01/2021   Newt Minion, MD Not Taking Active   polyethylene glycol (MIRALAX / GLYCOLAX) 17 g packet [id number] No Take 17 g by mouth daily as needed for mild constipation.  Patient not taking: Reported on 04/01/2021   Georgette Shell, MD Not Taking Active   Zinc Sulfate 220  (50 Zn) MG TABS [id number] No Take 1 tablet (220 mg total) by mouth daily.  Patient not taking: Reported on 04/01/2021   Cathlyn Parsons, PA-C Not Taking Active            Patient Active Problem List   Diagnosis Date Noted   Essential hypertension    Labile blood glucose    S/P bilateral BKA (below knee amputation) ([place])    S/P bilateral below knee amputation ([place]) 02/04/2021   Hyperlipidemia 01/30/2021   Anemia 01/30/2021   Osteomyelitis ([place]) 01/26/2021   Osteomyelitis of great toe of left foot (HCC)    Postoperative pain    AKI (acute kidney injury) ([place])    Acute blood loss anemia    Chronic diastolic congestive heart failure (HCC)    Diabetic peripheral neuropathy (HCC)    CKD (chronic kidney disease), stage II    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)    Leukocytosis    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[place]) 11/20/2018   Paroxysmal atrial fibrillation (HCC)    Diabetic polyneuropathy associated with type 2 diabetes mellitus (HCC)    PVD (peripheral vascular disease) ([place])    Critical lower limb ischemia (HCC)    Sepsis with acute renal failure ([place]) 11/13/2018   Acute renal failure superimposed on stage 2 chronic kidney disease ([place])    'light-for-dates' infant with signs of fetal malnutrition 10/17/2018   DM (diabetes mellitus) ([place]) 07/02/2012   Hypertension associated with diabetes ([place]) 07/02/2012     SDOH:  (Social Determinants of Health) assessments and interventions performed:    Care Plan  Review of patient past medical history, allergies, medications, health status, including review of consultants reports, laboratory and other test data, was performed as part of comprehensive evaluation for care management services.   There are no care plans that you recently modified to display for this patient.    Plan: The patient has been provided with contact information for the care management team and has been advised to call with any health related  questions or concerns.  The care management team will reach out to the patient again over the next 30 business  days.  Consepcion Utt L. Lavina Hamman, RN, BSN, [place] Coordinator Office number [phone] Main [facility] number [phone] Fax number [phone]

## 2021-06-04 ENCOUNTER — Other Ambulatory Visit: Payer: Self-pay

## 2021-06-04 ENCOUNTER — Other Ambulatory Visit: Payer: Self-pay | Admitting: *Deleted

## 2021-06-05 ENCOUNTER — Other Ambulatory Visit: Payer: Self-pay

## 2021-06-07 ENCOUNTER — Other Ambulatory Visit: Payer: Self-pay

## 2021-06-07 DIAGNOSIS — Z20822 Contact with and (suspected) exposure to covid-19: Secondary | ICD-10-CM | POA: Diagnosis not present

## 2021-06-11 ENCOUNTER — Ambulatory Visit: Payer: Medicare Other | Admitting: Physical Medicine and Rehabilitation

## 2021-06-16 ENCOUNTER — Other Ambulatory Visit: Payer: Self-pay

## 2021-06-23 ENCOUNTER — Encounter
Payer: Medicare Other | Attending: Physical Medicine and Rehabilitation | Admitting: Physical Medicine and Rehabilitation

## 2021-07-02 ENCOUNTER — Other Ambulatory Visit: Payer: Self-pay | Admitting: *Deleted

## 2021-07-02 NOTE — Patient Outreach (Signed)
[facility]) Care Management  07/02/2021  [place] Plaisted [birth year] [id number]   [facility] Care coordination in coming outreach from  patient - independent living services   Mr Dorrance called RN CM reporting he received a letter from [place] of independent living services about pending medical records needed to complete his application  He was encouraged to call [facility] to assist with getting  any possible records sent to her if needed RN CM completed a conference call with pt to [facility] HIM [phone]  [facility] is closed and a message could not be left as the mail box was full  RN CM sent an e-mail to assist pt to HIM request records be sent to [place] with her address and fax as provided by patient  RN CM provided him with HIM number for him to follow up  RN CM completed a conference call with pt living independent [phone]  Spoke with Felicia who confirmed Alcario Drought has already attempted to obtained pt records and is only awaiting completion of records for 2 years. A medical release form is not needed. Mr Morais voiced understanding that Alcario Drought is needing only a few more records to complete the application but has already received some records needed. Felicia transferred pt and RN CM to [place] but No answer. Alcario Drought was left a voice message to return a call to Mr Maland. Encouraged Mr Signorelli to call Alcario Drought at the number [phone]  Mr Battaglini is scheduled for [facility] health coach ([place]) outreach on 07/05/21 RN CM answered questions about the services. Discussed the letter mailed to him on 06/04/21 by [facility] health coach. RN CM made him aware of the difference in complex care services and disease management services. He was encourage to independently outreach to the resources provided in complex care services for follow up.      Emeline Simpson L. [name], RN, BSN, CCM [facility] Telephonic Care Management Care Coordinator Office number [phone] Mobile number [phone]  Main [facility] number  [phone] Fax number [phone]

## 2021-07-05 ENCOUNTER — Other Ambulatory Visit: Payer: Self-pay | Admitting: *Deleted

## 2021-07-06 ENCOUNTER — Encounter: Payer: Self-pay | Admitting: *Deleted

## 2021-07-06 NOTE — Patient Instructions (Signed)
Visit Information  Thank you for taking time to visit with me today. Please don't hesitate to contact me if I can be of assistance to you before our next scheduled telephone appointment.  Following are the goals we discussed today:  (Copy and paste patient goals from clinical care plan here)  Our next appointment is by telephone on Oct 04, 2021  Please call Gean Maidens at [phone]  if you need to cancel or reschedule your appointment.   Please call the Suicide and Crisis Lifeline: 988 if you are experiencing a Mental Health or Behavioral Health Crisis or need someone to talk to.  Following is a copy of your care plan:  Care Plan : RN Care Manager Plan of Care  Updates made by Akanksha Bellmore, Dennard Schaumann, RN since 07/06/2021 12:00 AM     Problem: Knowledge Deficit Related to Diabetes and Care Coordination Needs   Priority: High     Long-Range Goal: Development Plan of Care Management of Diabetes   Start Date: 04/02/2021  Expected End Date: 05/28/2022  Recent Progress: On track  Priority: High  Note:   Current Barriers:  Knowledge Deficits related to plan of care for management of DMII   RNCM Clinical Goal(s):  Patient will verbalize understanding of plan for management of DMII as evidenced by continuation of monitoring blood sugars and adhering to diabetic diet.  through collaboration with Medical illustrator, provider, and care team.   Interventions: Inter-disciplinary care team collaboration (see longitudinal plan of care) Evaluation of current treatment plan related to  self management and patient's adherence to plan as established by provider   Diabetes Interventions:  (Status:  Goal on track:  Yes.) Long Term Goal Assessed patient's understanding of A1c goal: <7% Provided education to patient about basic DM disease process Reviewed medications with patient and discussed importance of medication adherence Discussed plans with patient for ongoing care management follow up and  provided patient with direct contact information for care management team Advised patient, providing education and rationale, to check cbg daily and record, calling PCP for findings outside established parameters Lab Results  Component Value Date   HGBA1C 7.6 (A) 04/01/2021   Patient Goals/Self-Care Activities: Take all medications as prescribed Attend all scheduled provider appointments Call pharmacy for medication refills 3-7 days in advance of running out of medications Attend church or other social activities Perform all self care activities independently  Perform IADL's (shopping, preparing meals, housekeeping, managing finances) independently Call provider office for new concerns or questions  Work with the social worker to address care coordination needs and will continue to work with the clinical team to address health care and disease management related needs call the Suicide and Crisis Lifeline: 988 if experiencing a Mental Health or Behavioral Health Crisis  schedule appointment with eye doctor check blood sugar at prescribed times: once daily take the blood sugar log to all doctor visits drink 6 to 8 glasses of water each day prepare main meal at home 3 to 5 days each week switch to sugar-free drinks  Follow Up Plan:  Telephone follow up appointment with care management team member scheduled for:  May 2023 The patient has been provided with contact information for the care management team and has been advised to call with any health related questions or concerns.       The patient verbalized understanding of instructions, educational materials, and care plan provided today and agreed to receive a mailed copy of patient instructions, educational materials, and care plan.  Telephone follow up appointment with care management team member scheduled for: The patient has been provided with contact information for the care management team and has been advised to call with any  health related questions or concerns.   SIGNATURE Gean Maidens BSN RN [facility] Care Management [phone]

## 2021-07-06 NOTE — Patient Outreach (Addendum)
[facility]) Care Management [place] Note   07/06/2021 Name:  Joseph [name] MRN:  [id number] DOB:  01-19-1946  Summary: Patient had not checked his blood sugar yet today. Per patient his appetite is good. . Patient stated that he does not drink or smoke. He is going to food banks to assist with getting additional food.  He is using the [employer] system for transportation. He has prosthesis. He uses a cane. He has not had any recent falls.  Recommendations/Changes made from today's visit: RN made patient aware of [employer] transportation RN made patient aware of additional food banks Patient will monitor blood sugars Patient will look at starting an exercise routine.   Subjective: Joseph Guiana Haugen is an 76 y.o. year old male who is a primary patient of Charlott Rakes, MD. The care management team was consulted for assistance with care management and/or care coordination needs.    RN Health Coach completed Telephone Visit today.   Objective:  Medications Reviewed Today     Reviewed by Verlin Grills, RN (Case Manager) on 07/05/21 at 1344  Med List Status: <None>   Medication Order Taking? Sig Documenting Provider Last Dose Status Informant  Accu-Chek Softclix Lancets lancets [id number]  Use to check blood sugars three times per day AngiulliLavon Paganini, PA-C  Active   amLODipine (NORVASC) 10 MG tablet [id number] Yes Take 1 tablet (10 mg total) by mouth daily. Charlott Rakes, MD Taking Active   ascorbic acid (VITAMIN C) 1000 MG tablet [id number]  Take 1 tablet (1,000 mg total) by mouth daily.  Patient not taking: Reported on 04/01/2021   Elizabeth Sauer  Active   aspirin EC 81 MG EC tablet [id number] Yes Take 1 tablet (81 mg total) by mouth daily. Swallow whole. Cathlyn Parsons, PA-C Taking Active   atorvastatin (LIPITOR) 10 MG tablet [id number] Yes Take 1 tablet (10 mg total) by mouth daily. In the evening Charlott Rakes, MD Taking Active   Blood Glucose Monitoring  Suppl (ACCU-CHEK GUIDE) w/Device KIT [id number]  Check blood sugar three times daily. Charlott Rakes, MD  Active   diclofenac Sodium (VOLTAREN) 1 % GEL [id number]  Apply 2 g topically 3 (three) times daily.  Patient not taking: Reported on 04/01/2021   Cathlyn Parsons, PA-C  Active   ferrous sulfate (FEROSUL) 325 (65 FE) MG tablet [id number] Yes Take 1 tablet (325 mg total) by mouth daily with breakfast. Charlott Rakes, MD Taking Active   glucose blood (ACCU-CHEK GUIDE) test strip [id number]  Use as instructed Charlott Rakes, MD  Active   Insulin Glargine (BASAGLAR KWIKPEN) 100 UNIT/ML [id number]  Inject 15 Units into the skin daily. Charlott Rakes, MD  Active   Insulin Pen Needle 32G X 4 MM MISC [id number]  Use 1 pen tip to inject insulin once daily. Cathlyn Parsons, PA-C  Active   Misc. Devices MISC [id number]  leg extension and foot rest for the left side. Charlott Rakes, MD  Active   Multiple Vitamins-Minerals (ONE-A-DAY MENS 50+ ADVANTAGE) TABS [id number] Yes Take 1 tablet by mouth daily with breakfast. [provider] Taking Active Self  oxyCODONE (OXY IR/ROXICODONE) 5 MG immediate release tablet [id number]  Take 1 tablet (5 mg total) by mouth every 4 (four) hours as needed for moderate pain.  Patient not taking: Reported on 04/01/2021   Newt Minion, MD  Active   polyethylene glycol (MIRALAX / GLYCOLAX) 17 g packet [id number]  Take 17 g by mouth daily as needed for  mild constipation.  Patient not taking: Reported on 04/01/2021   Georgette Shell, MD  Active   Zinc Sulfate 220 (50 Zn) MG TABS [id number]  Take 1 tablet (220 mg total) by mouth daily.  Patient not taking: Reported on 04/01/2021   Angiulli, Lavon Paganini, PA-C  Active              SDOH:  (Social Determinants of Health) assessments and interventions performed:  SDOH Interventions    Flowsheet Row Most Recent Value  SDOH Interventions   Food Insecurity Interventions --  [patient goes to food bank]  Housing  Interventions Intervention Not Indicated  Transportation Interventions Intervention Not Indicated, Other (Comment)  [RN gave patient information on [facility] transportation]       Care Plan  Review of patient past medical history, allergies, medications, health status, including review of consultants reports, laboratory and other test data, was performed as part of comprehensive evaluation for care management services.   Care Plan : RN Care Manager Plan of Care  Updates made by Whitt Auletta, Eppie Gibson, RN since 07/06/2021 12:00 AM     Problem: Knowledge Deficit Related to Diabetes and Care Coordination Needs   Priority: High     Long-Range Goal: Development Plan of Care Management of Diabetes   Start Date: 04/02/2021  Expected End Date: 05/28/2022  Recent Progress: On track  Priority: High  Note:   Current Barriers:  Knowledge Deficits related to plan of care for management of DMII   RNCM Clinical Goal(s):  Patient will verbalize understanding of plan for management of DMII as evidenced by continuation of monitoring blood sugars and adhering to diabetic diet.  through collaboration with Consulting civil engineer, provider, and care team.   Interventions: Inter-disciplinary care team collaboration (see longitudinal plan of care) Evaluation of current treatment plan related to  self management and patient's adherence to plan as established by provider   Diabetes Interventions:  (Status:  Goal on track:  Yes.) Long Term Goal Assessed patient's understanding of A1c goal: <7% Provided education to patient about basic DM disease process Reviewed medications with patient and discussed importance of medication adherence Discussed plans with patient for ongoing care management follow up and provided patient with direct contact information for care management team Advised patient, providing education and rationale, to check cbg daily and record, calling PCP for findings outside established parameters Lab  Results  Component Value Date   HGBA1C 7.6 (A) 04/01/2021   Patient Goals/Self-Care Activities: Take all medications as prescribed Attend all scheduled provider appointments Call pharmacy for medication refills 3-7 days in advance of running out of medications Attend church or other social activities Perform all self care activities independently  Perform IADL's (shopping, preparing meals, housekeeping, managing finances) independently Call provider office for new concerns or questions  Work with the social worker to address care coordination needs and will continue to work with the clinical team to address health care and disease management related needs call the Suicide and Crisis Lifeline: 988 if experiencing a Mental Health or [place]  schedule appointment with eye doctor check blood sugar at prescribed times: once daily take the blood sugar log to all doctor visits drink 6 to 8 glasses of water each day prepare main meal at home 3 to 5 days each week switch to sugar-free drinks  Follow Up Plan:  Telephone follow up appointment with care management team member scheduled for:  May 2023 The patient has been provided with contact information for the care  management team and has been advised to call with any health related questions or concerns.        Plan: Telephone follow up appointment with care management team member scheduled for:  Oct 04, 2021 The patient has been provided with contact information for the care management team and has been advised to call with any health related questions or concerns.  RN ordered COVID free testing kits RN sent [employer] recreational information RN sent Planning healthy meals RN sent Living well with diabetes  [place] Management [phone]

## 2021-07-08 ENCOUNTER — Encounter: Payer: Self-pay | Admitting: Family Medicine

## 2021-07-08 DIAGNOSIS — H52203 Unspecified astigmatism, bilateral: Secondary | ICD-10-CM | POA: Diagnosis not present

## 2021-07-08 DIAGNOSIS — E119 Type 2 diabetes mellitus without complications: Secondary | ICD-10-CM | POA: Diagnosis not present

## 2021-07-08 DIAGNOSIS — H40023 Open angle with borderline findings, high risk, bilateral: Secondary | ICD-10-CM | POA: Diagnosis not present

## 2021-07-08 DIAGNOSIS — H35373 Puckering of macula, bilateral: Secondary | ICD-10-CM | POA: Diagnosis not present

## 2021-07-08 LAB — HM DIABETES EYE EXAM

## 2021-07-19 ENCOUNTER — Other Ambulatory Visit: Payer: Self-pay

## 2021-08-12 ENCOUNTER — Ambulatory Visit: Payer: Medicare Other | Admitting: Orthopedic Surgery

## 2021-08-18 ENCOUNTER — Other Ambulatory Visit: Payer: Self-pay

## 2021-08-19 ENCOUNTER — Other Ambulatory Visit: Payer: Self-pay

## 2021-08-31 ENCOUNTER — Other Ambulatory Visit: Payer: Self-pay

## 2021-09-07 ENCOUNTER — Other Ambulatory Visit: Payer: Self-pay

## 2021-09-08 DIAGNOSIS — Z20822 Contact with and (suspected) exposure to covid-19: Secondary | ICD-10-CM | POA: Diagnosis not present

## 2021-09-08 DIAGNOSIS — R051 Acute cough: Secondary | ICD-10-CM | POA: Diagnosis not present

## 2021-09-08 DIAGNOSIS — R059 Cough, unspecified: Secondary | ICD-10-CM | POA: Diagnosis not present

## 2021-09-13 DIAGNOSIS — Z20822 Contact with and (suspected) exposure to covid-19: Secondary | ICD-10-CM | POA: Diagnosis not present

## 2021-09-14 DIAGNOSIS — Z20822 Contact with and (suspected) exposure to covid-19: Secondary | ICD-10-CM | POA: Diagnosis not present

## 2021-09-16 ENCOUNTER — Other Ambulatory Visit: Payer: Self-pay

## 2021-09-16 ENCOUNTER — Other Ambulatory Visit: Payer: Self-pay | Admitting: Family Medicine

## 2021-09-16 MED ORDER — FERROUS SULFATE 325 (65 FE) MG PO TABS
325.0000 mg | ORAL_TABLET | Freq: Every day | ORAL | 0 refills | Status: DC
  Filled 2021-09-16: qty 30, 30d supply, fill #0

## 2021-09-16 NOTE — Telephone Encounter (Signed)
Requested medication (s) are due for refill today: yes ? ?Requested medication (s) are on the active medication list: yes ? ?Last refill:  05/05/21 ? ?Future visit scheduled: no ? ?Notes to clinic:  overdue lab work ? ? ?Requested Prescriptions  ?Pending Prescriptions Disp Refills  ? ferrous sulfate (FEROSUL) 325 (65 FE) MG tablet 30 tablet 2  ?  Sig: Take 1 tablet (325 mg total) by mouth daily with breakfast.  ?  ? Endocrinology:  Minerals - Iron Supplementation Failed - 09/16/2021  6:41 AM  ?  ?  Failed - HGB in normal range and within 360 days  ?  Hemoglobin  ?Date Value Ref Range Status  ?02/10/2021 10.0 (L) 13.0 - 17.0 g/dL Final  ?12/30/2020 11.8 (L) 13.0 - 17.7 g/dL Final  ?  ?  ?  ?  Failed - HCT in normal range and within 360 days  ?  HCT  ?Date Value Ref Range Status  ?02/10/2021 30.1 (L) 39.0 - 52.0 % Final  ? ?Hematocrit  ?Date Value Ref Range Status  ?12/30/2020 35.6 (L) 37.5 - 51.0 % Final  ?  ?  ?  ?  Failed - RBC in normal range and within 360 days  ?  RBC  ?Date Value Ref Range Status  ?02/10/2021 3.36 (L) 4.22 - 5.81 MIL/uL Final  ?  ?  ?  ?  Failed - Fe (serum) in normal range and within 360 days  ?  Iron  ?Date Value Ref Range Status  ?02/04/2021 15 (L) 45 - 182 ug/dL Final  ? ?Saturation Ratios  ?Date Value Ref Range Status  ?02/04/2021 7 (L) 17.9 - 39.5 % Final  ?  ?  ?  ?  Failed - Ferritin in normal range and within 360 days  ?  Ferritin  ?Date Value Ref Range Status  ?02/04/2021 465 (H) 24 - 336 ng/mL Final  ?  Comment:  ?  Performed at [facility], [place] [address] [place], [place] [address]  ?  ?  ?  ?  Passed - Valid encounter within last 12 months  ?  Recent Outpatient Visits   ? ?      ? 5 months ago Type 2 diabetes mellitus with hyperglycemia, without long-term current use of insulin ([place])  ? [place], Charlane Ferretti, MD  ? 8 months ago Type 2 diabetes mellitus with hyperglycemia, without long-term current use of insulin ([place])  ? [place], [place], [place]  ? 9 months ago Type 2 diabetes mellitus with hyperglycemia, without long-term current use of insulin ([place])  ? [place], [place], [place]  ? 1 year ago Type 2 diabetes mellitus with hyperglycemia, without long-term current use of insulin ([place])  ? [place], [place], [place]  ? 2 years ago Stage 3b chronic kidney disease  ? [place] Antony Blackbird, MD  ? ?  ?  ?Future Appointments   ? ?        ? In 1 week Newt Minion, MD [facility]  ? ?  ? ? ?  ?  ?  ? ? ? ? ?

## 2021-09-27 ENCOUNTER — Ambulatory Visit (INDEPENDENT_AMBULATORY_CARE_PROVIDER_SITE_OTHER): Payer: Medicare Other | Admitting: Orthopedic Surgery

## 2021-09-27 ENCOUNTER — Encounter: Payer: Self-pay | Admitting: Orthopedic Surgery

## 2021-09-27 DIAGNOSIS — Z89512 Acquired absence of left leg below knee: Secondary | ICD-10-CM | POA: Diagnosis not present

## 2021-09-27 NOTE — Progress Notes (Signed)
? ?Office Visit Note ?  ?Patient: Joseph [name]           ?Date of Birth: 1946/04/29           ?MRN: [id number] ?Visit Date: 09/27/2021 ?             ?Requested by: Charlott Rakes, MD ?[address] ?[address] ?[place],  [place] [address] ?PCP: Charlott Rakes, MD ? ?Chief Complaint  ?Patient presents with  ? Left Leg - Follow-up  ?  Hx left BKA 02/03/21  ? ? ? ? ?HPI: ?Patient is a 76 year old gentleman left transtibial amputee who had developed a ulcer in the popliteal fossa from subsiding into the socket.  Patient currently ambulates with a cane. ? ?Assessment & Plan: ?Visit Diagnoses:  ?1. Hx of BKA, left ([place])   ? ? ?Plan: Continue with his current prosthetic fitting.  Follow-up in 3 months or sooner if he develops recurrent ulceration. ? ?Follow-Up Instructions: Return in about 3 months (around 12/28/2021).  ? ?Ortho Exam ? ?Patient is alert, oriented, no adenopathy, well-dressed, normal affect, normal respiratory effort. ?Examination the ulcers on the left residual limb of completely healed there were no ulcers on the end of the residual limb there are no ulcers in the popliteal fossa.  There is no swelling.  There is good consolidation. ? ?Imaging: ?No results found. ? ? ?Labs: ?Lab Results  ?Component Value Date  ? HGBA1C 7.6 (A) 04/01/2021  ? HGBA1C 9.4 (H) 01/26/2021  ? HGBA1C 11.7 (A) 12/30/2020  ? ESRSEDRATE 120 (H) 01/30/2021  ? CRP 14.5 (H) 01/30/2021  ? REPTSTATUS 02/04/2021 FINAL 01/30/2021  ? REPTSTATUS 02/04/2021 FINAL 01/30/2021  ? CULT  01/30/2021  ?  NO GROWTH 5 DAYS ?Performed at [facility], [place] [address], [place], [place] [address] ?  ? CULT  01/30/2021  ?  NO GROWTH 5 DAYS ?Performed at [facility], [place] [address], [place],  [address] ?  ? ? ? ?Lab Results  ?Component Value Date  ? ALBUMIN 2.5 (L) 02/05/2021  ? ALBUMIN 2.5 (L) 02/04/2021  ? ALBUMIN 3.1 (L) 01/30/2021  ? ? ?Lab Results  ?Component Value Date  ? MG 1.8 02/04/2021  ? MG 1.7 02/03/2021  ? MG 2.0 11/15/2018   ? ?No results found for: VD25OH ? ?No results found for: PREALBUMIN ? ?  Latest Ref Rng & Units 02/10/2021  ?  5:03 AM 02/05/2021  ?  5:54 AM 02/04/2021  ?  1:42 AM  ?CBC EXTENDED  ?WBC 4.0 - 10.5 K/uL 12.7   15.3   16.4    ?RBC 4.22 - 5.81 MIL/uL 3.36   3.13   3.09    ?Hemoglobin 13.0 - 17.0 g/dL 10.0   9.4   9.3    ?HCT 39.0 - 52.0 % 30.1   28.6   28.6    ?Platelets 150 - 400 K/uL 443   381   398    ?NEUT# 1.7 - 7.7 K/uL 6.6   11.2   10.2    ?Lymph# 0.7 - 4.0 K/uL 5.0   3.2   5.6    ? ? ? ?There is no height or weight on file to calculate BMI. ? ?Orders:  ?No orders of the defined types were placed in this encounter. ? ?No orders of the defined types were placed in this encounter. ? ? ? Procedures: ?No procedures performed ? ?Clinical Data: ?No additional findings. ? ?ROS: ? ?All other systems negative, except as noted  in the HPI. ?Review of Systems ? ?Objective: ?Vital Signs: There were no vitals taken for this visit. ? ?Specialty Comments:  ?No specialty comments available. ? ?PMFS History: ?Patient Active Problem List  ? Diagnosis Date Noted  ? Essential hypertension   ? Labile blood glucose   ? S/P bilateral BKA (below knee amputation) ([place])   ? S/P bilateral below knee amputation ([place]) 02/04/2021  ? Hyperlipidemia 01/30/2021  ? Anemia 01/30/2021  ? Osteomyelitis ([place]) 01/26/2021  ? Osteomyelitis of great toe of left foot ([place])   ? Postoperative pain   ? AKI (acute kidney injury) ([place])   ? Acute blood loss anemia   ? Chronic diastolic congestive heart failure ([place])   ? Diabetic peripheral neuropathy ([place])   ? CKD (chronic kidney disease), stage II   ? 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 Leukocytosis   ? 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[place]) 11/20/2018  ? Paroxysmal atrial fibrillation (HCC)   ? Diabetic polyneuropathy associated with type 2 diabetes mellitus ([place])   ? PVD (peripheral vascular disease) ([place])   ? Critical lower limb ischemia (HCC)   ? Sepsis with acute renal failure ([place])  11/13/2018  ? Acute renal failure superimposed on stage 2 chronic kidney disease ([place])   ? 'light-for-dates' infant with signs of fetal malnutrition 10/17/2018  ? DM (diabetes mellitus) ([place]) 07/02/2012  ? Hypertension associated with diabetes ([place]) 07/02/2012  ? ?Past Medical History:  ?Diagnosis Date  ? Cellulitis of right lower extremity   ? Diabetes mellitus type 2 in nonobese [facility])   ? Gangrene of right foot ([place])   ? Hypertension   ? Open wound of right foot   ?  ?Family History  ?Problem Relation Age of Onset  ? Diabetes Father   ? Cancer Neg Hx   ? CAD Neg Hx   ?  ?Past Surgical History:  ?Procedure Laterality Date  ? ABDOMINAL AORTOGRAM W/LOWER EXTREMITY Left 01/27/2021  ? Procedure: ABDOMINAL AORTOGRAM W/LOWER EXTREMITY;  Surgeon: Cherre Robins, MD;  Location: [facility];  Service: Cardiovascular;  Laterality: Left;  ? AMPUTATION Right 11/16/2018  ? Procedure: RIGHT BELOW KNEE AMPUTATION;  Surgeon: Newt Minion, MD;  Location: [facility];  Service: Orthopedics;  Laterality: Right;  ? AMPUTATION Left 02/03/2021  ? Procedure: LEFT BELOW KNEE AMPUTATION;  Surgeon: Newt Minion, MD;  Location: [facility];  Service: Orthopedics;  Laterality: Left;  ? APPLICATION OF WOUND VAC  02/03/2021  ? Procedure: APPLICATION OF WOUND VAC;  Surgeon: Newt Minion, MD;  Location: [facility];  Service: Orthopedics;;  ? HAND SURGERY    ? ?Social History  ? ?Occupational History  ? Occupation: retired   ?  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 Smoking status: Never  ? Smokeless tobacco: Never  ?Vaping Use  ? Vaping Use: Never used  ?Substance and Sexual Activity  ? Alcohol use: Not Currently  ? Drug use: No  ? Sexual activity: Not on file  ? ? ? ? ? ?

## 2021-10-03 DIAGNOSIS — Z20822 Contact with and (suspected) exposure to covid-19: Secondary | ICD-10-CM | POA: Diagnosis not present

## 2021-10-04 ENCOUNTER — Other Ambulatory Visit: Payer: Self-pay | Admitting: *Deleted

## 2021-10-04 DIAGNOSIS — Z20822 Contact with and (suspected) exposure to covid-19: Secondary | ICD-10-CM | POA: Diagnosis not present

## 2021-10-04 NOTE — Patient Outreach (Signed)
[facility]) Care Management ? ?10/04/2021 ? ?Montenegro Simonetti ?1945/08/22 ?[id number] ? ? ?RN Health Coach attempted follow up outreach call to patient.  Patient was unavailable. HIPPA compliance voicemail message left with return callback number. ? ?Plan: ?RN will call patient again within 30 days. ? ?Gean Maidens BSN RN ?[facility] ?[phone] ? ?

## 2021-10-08 ENCOUNTER — Other Ambulatory Visit: Payer: Self-pay

## 2021-10-21 ENCOUNTER — Other Ambulatory Visit: Payer: Self-pay | Admitting: Family Medicine

## 2021-10-21 ENCOUNTER — Other Ambulatory Visit: Payer: Self-pay

## 2021-10-21 DIAGNOSIS — E1165 Type 2 diabetes mellitus with hyperglycemia: Secondary | ICD-10-CM

## 2021-10-21 MED ORDER — TECHLITE PEN NEEDLES 32G X 4 MM MISC
1.0000 | Freq: Every day | 0 refills | Status: DC
  Filled 2021-10-21: qty 100, 90d supply, fill #0

## 2021-10-26 ENCOUNTER — Other Ambulatory Visit: Payer: Self-pay

## 2021-10-27 ENCOUNTER — Other Ambulatory Visit: Payer: Self-pay

## 2021-11-01 ENCOUNTER — Telehealth: Payer: Self-pay

## 2021-11-01 NOTE — Telephone Encounter (Signed)
LVM for Patient to return call  RE: Schedule AWV with [facility] with Addaleigh Nicholls CMA  Please transfer patient when if they return the call  

## 2021-11-10 ENCOUNTER — Other Ambulatory Visit: Payer: Self-pay | Admitting: *Deleted

## 2021-11-10 NOTE — Patient Outreach (Signed)
[facility]) Care Management  11/10/2021  Montenegro Milstein 11/06/1945 [id number]   Patient had returned call and RN was not available [phone] RN Health Coach attempted follow up outreach call to patient.  Patient was unavailable. HIPPA compliance voicemail message left with return callback number.  Plan: RN will call patient again within 30 days.  Gean Maidens BSN RN [facility] [phone]

## 2021-11-12 ENCOUNTER — Other Ambulatory Visit: Payer: Self-pay

## 2021-11-18 ENCOUNTER — Ambulatory Visit (INDEPENDENT_AMBULATORY_CARE_PROVIDER_SITE_OTHER): Payer: Medicare Other

## 2021-11-18 DIAGNOSIS — Z0000 Encounter for general adult medical examination without abnormal findings: Secondary | ICD-10-CM

## 2021-11-18 NOTE — Progress Notes (Cosign Needed)
Subjective:   Joseph [name] is a 76 y.o. male who presents for Medicare Annual/Subsequent preventive examination. I discussed the limitations of evaluation and management by telemedicine and the availability of in person appointments. The patient expressed understanding and agreed to proceed.   Visit performed by audio   Patient location: Home  Provider location: Home  Review of Systems    N/A       Objective:    There were no vitals filed for this visit. There is no height or weight on file to calculate BMI.     02/04/2021    6:29 PM 01/30/2021    4:30 PM 01/26/2021    8:06 AM 11/03/2020   10:41 AM 10/20/2020    2:30 PM 09/26/2019   11:01 AM 11/20/2018    5:03 PM  Advanced Directives  Does Patient Have a Medical Advance Directive? No Yes No No No Yes No  Type of Advance Directive Living will Living will    [place];Living will   Does patient want to make changes to medical advance directive? Yes (Inpatient - patient requests chaplain consult to change a medical advance directive) No - Patient declined       Copy of [place] in Chart?      No - copy requested   Would patient like information on creating a medical advance directive? Yes (Inpatient - patient requests chaplain consult to create a medical advance directive)  No - Guardian declined Yes (MAU/Ambulatory/Procedural Areas - Information given) No - Patient declined  No - Patient declined    Current Medications (verified) Outpatient Encounter Medications as of 11/18/2021  Medication Sig   Accu-Chek Softclix Lancets lancets Use to check blood sugars three times per day   ascorbic acid (VITAMIN C) 1000 MG tablet Take 1 tablet (1,000 mg total) by mouth daily.   aspirin EC 81 MG EC tablet Take 1 tablet (81 mg total) by mouth daily. Swallow whole.   Blood Glucose Monitoring Suppl (ACCU-CHEK GUIDE) w/Device KIT Check blood sugar three times daily.   diclofenac Sodium (VOLTAREN) 1 % GEL Apply  2 g topically 3 (three) times daily.   ferrous sulfate (FEROSUL) 325 (65 FE) MG tablet Take 1 tablet (325 mg total) by mouth daily with breakfast.   glucose blood (ACCU-CHEK GUIDE) test strip Use as instructed   Insulin Glargine (BASAGLAR KWIKPEN) 100 UNIT/ML Inject 15 Units into the skin daily.   Insulin Pen Needle (TECHLITE PEN NEEDLES) 32G X 4 MM MISC Use 1 pen tip to inject insulin once daily.   Misc. Devices MISC leg extension and foot rest for the left side.   Multiple Vitamins-Minerals (ONE-A-DAY MENS 50+ ADVANTAGE) TABS Take 1 tablet by mouth daily with breakfast.   oxyCODONE (OXY IR/ROXICODONE) 5 MG immediate release tablet Take 1 tablet (5 mg total) by mouth every 4 (four) hours as needed for moderate pain.   polyethylene glycol (MIRALAX / GLYCOLAX) 17 g packet Take 17 g by mouth daily as needed for mild constipation.   Zinc Sulfate 220 (50 Zn) MG TABS Take 1 tablet (220 mg total) by mouth daily.   [DISCONTINUED] amLODipine (NORVASC) 10 MG tablet Take 1 tablet (10 mg total) by mouth daily.   [DISCONTINUED] atorvastatin (LIPITOR) 10 MG tablet Take 1 tablet (10 mg total) by mouth daily. In the evening   No facility-administered encounter medications on file as of 11/18/2021.    Allergies (verified) Other   History: Past Medical History:  Diagnosis Date  Cellulitis of right lower extremity    Diabetes mellitus type 2 in nonobese [facility])    Gangrene of right foot ([place])    Hypertension    Open wound of right foot    Past Surgical History:  Procedure Laterality Date   ABDOMINAL AORTOGRAM W/LOWER EXTREMITY Left 01/27/2021   Procedure: ABDOMINAL AORTOGRAM W/LOWER EXTREMITY;  Surgeon: Cherre Robins, MD;  Location: [facility];  Service: Cardiovascular;  Laterality: Left;   AMPUTATION Right 11/16/2018   Procedure: RIGHT BELOW KNEE AMPUTATION;  Surgeon: Newt Minion, MD;  Location: [facility];  Service: Orthopedics;  Laterality: Right;   AMPUTATION Left 02/03/2021   Procedure: LEFT  BELOW KNEE AMPUTATION;  Surgeon: Newt Minion, MD;  Location: [facility];  Service: Orthopedics;  Laterality: Left;   APPLICATION OF WOUND VAC  02/03/2021   Procedure: APPLICATION OF WOUND VAC;  Surgeon: Newt Minion, MD;  Location: [facility];  Service: Orthopedics;;   HAND SURGERY     Family History  Problem Relation Age of Onset   Diabetes Father    Cancer Neg Hx    CAD Neg Hx    Social History   Socioeconomic History   Marital status: Divorced    Spouse name: Not on file   Number of children: Not on file   Years of education: Not on file   Highest education level: Not on file  Occupational History   Occupation: retired     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  Tobacco Use   Smoking status: Never   Smokeless tobacco: Never  Scientific laboratory technician Use: Never used  Substance and Sexual Activity   Alcohol use: Not Currently   Drug use: No   Sexual activity: Not on file  Other Topics Concern   Not on file  Social History Narrative   Lives alone, 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   On fixed Social security income   Has a daughter who is a Quarry manager and son does not speak with them every day   Most reliable family members reported to live in [place]   Divorced but remains friends with his ex wife, Lucina Mellow   Has advance directives but has not as 11/03/20 given a copy to [facility]   Goes to church      Social Determinants of Health   Financial Resource Strain: [place]  (11/18/2021)   Overall Financial Resource Strain (CARDIA)    Difficulty of Paying Living Expenses: Not very hard  Food Insecurity: Food Insecurity Present (07/05/2021)   Hunger Vital Sign    Worried About [place] in the Last Year: Sometimes true    [place] in the Last Year: Sometimes true  Transportation Needs: No Transportation Needs (11/18/2021)   PRAPARE - Hydrologist (Medical): No    Lack of Transportation (Non-Medical): No  Physical Activity: Not on  file  Stress: No Stress Concern Present (06/03/2021)   [place]    Feeling of Stress : Only a little  Social Connections: Moderately Integrated (11/18/2021)   Social Connection and Isolation Panel [NHANES]    Frequency of Communication with Friends and Family: Twice a week    Frequency of Social Gatherings with Friends and Family: Twice a week    Attends Religious Services: 1 to 4 times per year    Active Member of [employer] or Organizations: Yes  Attends Archivist Meetings: 1 to 4 times per year    Marital Status: Never married    Tobacco Counseling Counseling given: Not Answered   Clinical Intake:  Pre-visit preparation completed: Yes              Diabetic?Yes         Activities of Daily Living    02/04/2021   11:16 PM 02/04/2021    7:42 PM  In your present state of health, do you have any difficulty performing the following activities:  Hearing? 0 0  Vision? 0 0  Difficulty concentrating or making decisions? 0 0  Walking or climbing stairs? 1 1  Dressing or bathing? 0 0  Doing errands, shopping? 0     Patient Care Team: Charlott Rakes, MD as PCP - General (Family Medicine) Fay Records, MD as PCP - Cardiology (Cardiology) Criselda Peaches, DPM as Consulting Physician (Podiatry) Newt Minion, MD as Consulting Physician (Orthopedic Surgery)  Indicate any recent Medical Services you may have received from other than [facility] providers in the past year (date may be approximate).     Assessment:   This is a routine wellness examination for Joseph [place].  Hearing/Vision screen No results found.  Dietary issues and exercise activities discussed:     Goals Addressed   None    Depression Screen    11/18/2021   10:44 AM 06/03/2021    1:53 PM 05/03/2021    2:34 PM 04/02/2021    1:23 PM 04/01/2021   11:32 AM 03/10/2021   11:26 AM 12/03/2020   12:53 PM  PHQ 2/9 Scores  PHQ - 2 Score 0 0  0 0 0 0 0  PHQ- 9 Score     0 0     Fall Risk    07/05/2021    2:11 PM 04/01/2021   11:23 AM 03/10/2021   11:26 AM 11/03/2020    1:02 PM 05/17/2019    3:54 PM  [place] in the past year? 0 0 0 0 0  Number falls in past yr: 0 0 0 0   Injury with Fall? 0 0 0 0   Follow up Falls evaluation completed   Falls evaluation completed;Falls prevention discussed       TIMED UP AND GO:  Was the test performed? No .  Length of time to ambulate 10 feet: 0 sec.     Cognitive Function:        Immunizations Immunization History  Administered Date(s) Administered   Influenza,inj,Quad PF,6+ Mos 02/12/2013    TDAP status: Due, Education has been provided regarding the importance of this vaccine. Advised may receive this vaccine at local pharmacy or Health Dept. Aware to provide a copy of the vaccination record if obtained from local pharmacy or Health Dept. Verbalized acceptance and understanding.  Flu Vaccine status: Due, Education has been provided regarding the importance of this vaccine. Advised may receive this vaccine at local pharmacy or Health Dept. Aware to provide a copy of the vaccination record if obtained from local pharmacy or Health Dept. Verbalized acceptance and understanding.  Pneumococcal vaccine status: Due, Education has been provided regarding the importance of this vaccine. Advised may receive this vaccine at local pharmacy or Health Dept. Aware to provide a copy of the vaccination record if obtained from local pharmacy or Health Dept. Verbalized acceptance and understanding.  Covid-19 vaccine status: Declined, Education has been provided regarding the importance of this vaccine but patient still declined.  Advised may receive this vaccine at local pharmacy or Health Dept.or vaccine clinic. Aware to provide a copy of the vaccination record if obtained from local pharmacy or Health Dept. Verbalized acceptance and understanding.  Qualifies for Shingles Vaccine? Yes    Zostavax completed No   Shingrix Completed?: No.    Education has been provided regarding the importance of this vaccine. Patient has been advised to call insurance company to determine out of pocket expense if they have not yet received this vaccine. Advised may also receive vaccine at local pharmacy or Health Dept. Verbalized acceptance and understanding.  Screening Tests Health Maintenance  Topic Date Due   Pneumonia Vaccine 22+ Years old (1 - PCV) Never done   FOOT EXAM  Never done   Hepatitis C Screening  Never done   TETANUS/TDAP  Never done   Zoster Vaccines- Shingrix (1 of 2) Never done   COLONOSCOPY (Pts 45-81yr Insurance coverage will need to be confirmed)  Never done   URINE MICROALBUMIN  06/30/2013   HEMOGLOBIN A1C  09/29/2021   OPHTHALMOLOGY EXAM  07/08/2022   HPV VACCINES  Aged Out   COVID-19 Vaccine  Discontinued    Health Maintenance  Health Maintenance Due  Topic Date Due   Pneumonia Vaccine 76 Years old (1 - PCV) Never done   FOOT EXAM  Never done   Hepatitis C Screening  Never done   TETANUS/TDAP  Never done   Zoster Vaccines- Shingrix (1 of 2) Never done   COLONOSCOPY (Pts 45-43yrInsurance coverage will need to be confirmed)  Never done   URINE MICROALBUMIN  06/30/2013   HEMOGLOBIN A1C  09/29/2021    Colorectal cancer screening: No longer required.   Lung Cancer Screening: (Low Dose CT Chest recommended if Age 74-80ears, 30 pack-year currently smoking OR have quit w/in 15years.) does not qualify.   Lung Cancer Screening Referral: No  Additional Screening:  Hepatitis C Screening: does qualify; Completed No  Vision Screening: Recommended annual ophthalmology exams for early detection of glaucoma and other disorders of the eye. Is the patient up to date with their annual eye exam?  Yes  Who is the provider or what is the name of the office in which the patient attends annual eye exams? [facility] If pt is not established with a  provider, would they like to be referred to a provider to establish care? No .   Dental Screening: Recommended annual dental exams for proper oral hygiene  Community Resource Referral / Chronic Care Management: CRR required this visit?  No   CCM required this visit?  No      Plan:     I have personally reviewed and noted the following in the patient's chart:   Medical and social history Use of alcohol, tobacco or illicit drugs  Current medications and supplements including opioid prescriptions. Patient is currently taking opioid prescriptions. Information provided to patient regarding non-opioid alternatives. Patient advised to discuss non-opioid treatment plan with their provider. Functional ability and status Nutritional status Physical activity Advanced directives List of other physicians Hospitalizations, surgeries, and ER visits in previous 12 months Vitals Screenings to include cognitive, depression, and falls Referrals and appointments  In addition, I have reviewed and discussed with patient certain preventive protocols, quality metrics, and best practice recommendations. A written personalized care plan for preventive services as well as general preventive health recommendations were provided to patient.     LiRenato GailsCM[place] 01/07/2022    Mr. Cambre ,  Thank you for taking time to come for your Medicare Wellness Visit. I appreciate your ongoing commitment to your health goals. Please review the following plan we discussed and let me know if I can assist you in the future.   These are the goals we discussed:  Goals   None     This is a list of the screening recommended for you and due dates:  Health Maintenance  Topic Date Due   Pneumonia Vaccine (1 - PCV) Never done   Complete foot exam   Never done   Hepatitis C Screening: USPSTF Recommendation to screen - Ages 28-79 yo.  Never done   Tetanus Vaccine  Never done   Zoster (Shingles) Vaccine (1 of 2) Never  done   Colon Cancer Screening  Never done   Urine Protein Check  06/30/2013   Hemoglobin A1C  09/29/2021   Eye exam for diabetics  07/08/2022   HPV Vaccine  Aged Out   COVID-19 Vaccine  Discontinued      I have reviewed and agreed to the above documentation.   Theresia Lo, FNP

## 2021-12-01 ENCOUNTER — Other Ambulatory Visit: Payer: Self-pay | Admitting: *Deleted

## 2021-12-01 NOTE — Patient Outreach (Signed)
[facility]) Care Management  12/01/2021  Montenegro Moening [birth year] [id number]   RN Health Coach attempted follow up outreach call to patient.  Patient was unavailable. HIPPA compliance voicemail message left with return callback number.  Plan: RN will call patient again within 30 days.  Gean Maidens BSN RN [facility] [phone]

## 2021-12-15 ENCOUNTER — Other Ambulatory Visit: Payer: Self-pay | Admitting: Family Medicine

## 2021-12-15 ENCOUNTER — Other Ambulatory Visit: Payer: Self-pay

## 2021-12-15 DIAGNOSIS — E1122 Type 2 diabetes mellitus with diabetic chronic kidney disease: Secondary | ICD-10-CM

## 2021-12-15 DIAGNOSIS — E1165 Type 2 diabetes mellitus with hyperglycemia: Secondary | ICD-10-CM

## 2021-12-15 MED ORDER — AMLODIPINE BESYLATE 10 MG PO TABS
10.0000 mg | ORAL_TABLET | Freq: Every day | ORAL | 0 refills | Status: DC
  Filled 2021-12-15: qty 30, 30d supply, fill #0

## 2021-12-15 MED ORDER — ATORVASTATIN CALCIUM 10 MG PO TABS
10.0000 mg | ORAL_TABLET | Freq: Every day | ORAL | 0 refills | Status: DC
  Filled 2021-12-15: qty 30, 30d supply, fill #0

## 2021-12-17 ENCOUNTER — Other Ambulatory Visit: Payer: Self-pay

## 2021-12-20 ENCOUNTER — Other Ambulatory Visit: Payer: Self-pay | Admitting: *Deleted

## 2021-12-20 NOTE — Patient Outreach (Signed)
[facility]) Care Management  12/20/2021  Joseph [name] 03/18/1946 [id number]   RN Health Coach attempted follow up outreach call to patient.  Patient was unavailable. HIPPA compliance voicemail message left with return callback number.  Plan: RN Health Coach case closure Unable to contact  Gean Maidens BSN RN [facility] [phone]

## 2021-12-24 ENCOUNTER — Other Ambulatory Visit: Payer: Self-pay | Admitting: Family Medicine

## 2021-12-24 ENCOUNTER — Other Ambulatory Visit: Payer: Self-pay

## 2021-12-24 DIAGNOSIS — E1165 Type 2 diabetes mellitus with hyperglycemia: Secondary | ICD-10-CM

## 2021-12-27 ENCOUNTER — Encounter: Payer: Self-pay | Admitting: Orthopedic Surgery

## 2021-12-27 ENCOUNTER — Ambulatory Visit (INDEPENDENT_AMBULATORY_CARE_PROVIDER_SITE_OTHER): Payer: Medicare Other | Admitting: Orthopedic Surgery

## 2021-12-27 DIAGNOSIS — Z89511 Acquired absence of right leg below knee: Secondary | ICD-10-CM

## 2021-12-27 DIAGNOSIS — Z89512 Acquired absence of left leg below knee: Secondary | ICD-10-CM | POA: Diagnosis not present

## 2021-12-27 NOTE — Progress Notes (Signed)
Office Visit Note   Patient: Joseph [name]           Date of Birth: Feb 11, 1946           MRN: [id number] Visit Date: 12/27/2021              Requested by: Hoy Register, MD [address] [place],  [place] [address] PCP: Hoy Register, MD  Chief Complaint  Patient presents with   Left Leg - Follow-up    Left below knee amputation 2022      HPI: Patient is a 76 year old gentleman who presents in follow-up for recurrent ulcers left transtibial amputation secondary to loss of residual volume.  Patient has had several socket modifications he is using multiple ply sock and still does not have a good fit.  Assessment & Plan: Visit Diagnoses:  1. Hx of BKA, left (HCC)   2. Acquired absence of right lower extremity below knee [facility])     Plan: Patient will follow-up with [facility] this week for prosthetic socket fitting.  Patient will follow-up in the office once he has had time to ambulate in his new socket.  Follow-Up Instructions: Return if symptoms worsen or fail to improve.   Ortho Exam  Patient is alert, oriented, no adenopathy, well-dressed, normal affect, normal respiratory effort. Examination patient has significant decreased volume in the left residual limb there is no redness no cellulitis the ulcers have healed.  Patient is an existing left transtibial  amputee.  Patient's current comorbidities are not expected to impact the ability to function with the prescribed prosthesis. Patient verbally communicates a strong desire to use a prosthesis. Patient currently requires mobility aids to ambulate without a prosthesis.  Expects not to use mobility aids with a new prosthesis.  Patient is a K3 level ambulator that spends a lot of time walking around on uneven terrain over obstacles, up and down stairs, and ambulates with a variable cadence.     Imaging: No results found. No images are attached to the encounter.  Labs: Lab Results  Component Value Date    HGBA1C 7.6 (A) 04/01/2021   HGBA1C 9.4 (H) 01/26/2021   HGBA1C 11.7 (A) 12/30/2020   ESRSEDRATE 120 (H) 01/30/2021   CRP 14.5 (H) 01/30/2021   REPTSTATUS 02/04/2021 FINAL 01/30/2021   REPTSTATUS 02/04/2021 FINAL 01/30/2021   CULT  01/30/2021    NO GROWTH 5 DAYS Performed at [facility], [address], [place], [place] [address]    CULT  01/30/2021    NO GROWTH 5 DAYS Performed at [facility], [address] [place], [place] [address]      Lab Results  Component Value Date   ALBUMIN 2.5 (L) 02/05/2021   ALBUMIN 2.5 (L) 02/04/2021   ALBUMIN 3.1 (L) 01/30/2021    Lab Results  Component Value Date   MG 1.8 02/04/2021   MG 1.7 02/03/2021   MG 2.0 11/15/2018   No results found for: "VD25OH"  No results found for: "PREALBUMIN"    Latest Ref Rng & Units 02/10/2021    5:03 AM 02/05/2021    5:54 AM 02/04/2021    1:42 AM  CBC EXTENDED  WBC 4.0 - 10.5 K/uL 12.7  15.3  16.4   RBC 4.22 - 5.81 MIL/uL 3.36  3.13  3.09   Hemoglobin 13.0 - 17.0 g/dL 35.3  9.4  9.3   HCT 29.9 - 52.0 % 30.1  28.6  28.6   Platelets 150 - 400 K/uL  443  381  398   NEUT# 1.7 - 7.7 K/uL 6.6  11.2  10.2   Lymph# 0.7 - 4.0 K/uL 5.0  3.2  5.6      There is no height or weight on file to calculate BMI.  Orders:  No orders of the defined types were placed in this encounter.  No orders of the defined types were placed in this encounter.    Procedures: No procedures performed  Clinical Data: No additional findings.  ROS:  All other systems negative, except as noted in the HPI. Review of Systems  Objective: Vital Signs: There were no vitals taken for this visit.  Specialty Comments:  No specialty comments available.  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)    Leukocytosis    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HCC) 11/20/2018   Paroxysmal atrial fibrillation (HCC)    Diabetic polyneuropathy associated with type 2 diabetes mellitus (HCC)    PVD (peripheral vascular disease) (HCC)    Critical lower limb ischemia (HCC)    Sepsis with acute renal failure (HCC) 11/13/2018   Acute renal failure superimposed on stage 2 chronic kidney disease (HCC)    'light-for-dates' infant with signs of fetal malnutrition 10/17/2018   DM (diabetes mellitus) (HCC) 07/02/2012   Hypertension associated with diabetes (HCC) 07/02/2012   Past Medical History:  Diagnosis Date   Cellulitis of right lower extremity    Diabetes mellitus type 2 in nonobese (HCC)    Gangrene of right foot (HCC)    Hypertension    Open wound of right foot     Family History  Problem Relation Age of Onset   Diabetes Father    Cancer Neg Hx    CAD Neg Hx     Past Surgical History:  Procedure Laterality Date   ABDOMINAL AORTOGRAM W/LOWER EXTREMITY Left 01/27/2021   Procedure: ABDOMINAL AORTOGRAM W/LOWER EXTREMITY;  Surgeon: Leonie Douglas, MD;  [facility];  Service: Cardiovascular;  Laterality: Left;   AMPUTATION Right 11/16/2018   Procedure: RIGHT BELOW KNEE AMPUTATION;  Surgeon: [name], MD;  Location: [facility] OR;  Service: Orthopedics;  Laterality: Right;   AMPUTATION Left 02/03/2021   Procedure: LEFT BELOW KNEE AMPUTATION;  Surgeon: [name], MD;  Location[facility] OR;  Service: Orthopedics;  Laterality: Left;   APPLICATION OF WOUND VAC  02/03/2021   Procedure: APPLICATION OF WOUND VAC;  Surgeon: [name], MD;  Location: [facility] OR;  Service: Orthopedics;;   HAND SURGERY     Social History   Occupational History   Occupation: retired     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

## 2022-02-01 ENCOUNTER — Other Ambulatory Visit: Payer: Self-pay | Admitting: Family Medicine

## 2022-02-01 ENCOUNTER — Other Ambulatory Visit: Payer: Self-pay

## 2022-02-01 DIAGNOSIS — I129 Hypertensive chronic kidney disease with stage 1 through stage 4 chronic kidney disease, or unspecified chronic kidney disease: Secondary | ICD-10-CM

## 2022-02-01 DIAGNOSIS — E1165 Type 2 diabetes mellitus with hyperglycemia: Secondary | ICD-10-CM

## 2022-02-02 ENCOUNTER — Other Ambulatory Visit: Payer: Self-pay

## 2022-02-03 ENCOUNTER — Other Ambulatory Visit: Payer: Self-pay

## 2022-02-03 ENCOUNTER — Other Ambulatory Visit: Payer: Self-pay | Admitting: Family Medicine

## 2022-02-03 DIAGNOSIS — E1122 Type 2 diabetes mellitus with diabetic chronic kidney disease: Secondary | ICD-10-CM

## 2022-02-03 DIAGNOSIS — E1165 Type 2 diabetes mellitus with hyperglycemia: Secondary | ICD-10-CM

## 2022-02-03 MED ORDER — ATORVASTATIN CALCIUM 10 MG PO TABS
10.0000 mg | ORAL_TABLET | Freq: Every day | ORAL | 0 refills | Status: DC
  Filled 2022-02-03: qty 30, 30d supply, fill #0

## 2022-02-03 MED ORDER — AMLODIPINE BESYLATE 10 MG PO TABS
10.0000 mg | ORAL_TABLET | Freq: Every day | ORAL | 0 refills | Status: DC
  Filled 2022-02-03: qty 30, 30d supply, fill #0

## 2022-02-03 MED ORDER — TECHLITE PEN NEEDLES 32G X 4 MM MISC
1.0000 | Freq: Every day | 0 refills | Status: DC
  Filled 2022-02-03: qty 100, 90d supply, fill #0

## 2022-02-03 NOTE — Telephone Encounter (Signed)
Will forward to provider  

## 2022-02-03 NOTE — Telephone Encounter (Signed)
Pt called and scheduled the next available appt with Bertram Denver, he says he needs needles along with his insulin. Wants to pick up all of his supplies today.

## 2022-03-10 ENCOUNTER — Other Ambulatory Visit: Payer: Self-pay

## 2022-03-22 ENCOUNTER — Ambulatory Visit: Payer: Medicare Other | Attending: Nurse Practitioner | Admitting: Nurse Practitioner

## 2022-03-22 ENCOUNTER — Encounter: Payer: Self-pay | Admitting: Nurse Practitioner

## 2022-03-22 ENCOUNTER — Other Ambulatory Visit: Payer: Self-pay

## 2022-03-22 DIAGNOSIS — N1832 Chronic kidney disease, stage 3b: Secondary | ICD-10-CM | POA: Diagnosis not present

## 2022-03-22 DIAGNOSIS — I10 Essential (primary) hypertension: Secondary | ICD-10-CM | POA: Diagnosis not present

## 2022-03-22 DIAGNOSIS — E1122 Type 2 diabetes mellitus with diabetic chronic kidney disease: Secondary | ICD-10-CM

## 2022-03-22 DIAGNOSIS — Z23 Encounter for immunization: Secondary | ICD-10-CM

## 2022-03-22 DIAGNOSIS — Z7984 Long term (current) use of oral hypoglycemic drugs: Secondary | ICD-10-CM | POA: Diagnosis not present

## 2022-03-22 DIAGNOSIS — Z79899 Other long term (current) drug therapy: Secondary | ICD-10-CM | POA: Insufficient documentation

## 2022-03-22 DIAGNOSIS — D649 Anemia, unspecified: Secondary | ICD-10-CM

## 2022-03-22 DIAGNOSIS — N182 Chronic kidney disease, stage 2 (mild): Secondary | ICD-10-CM

## 2022-03-22 DIAGNOSIS — Z794 Long term (current) use of insulin: Secondary | ICD-10-CM | POA: Insufficient documentation

## 2022-03-22 DIAGNOSIS — Z1159 Encounter for screening for other viral diseases: Secondary | ICD-10-CM | POA: Diagnosis not present

## 2022-03-22 DIAGNOSIS — E1165 Type 2 diabetes mellitus with hyperglycemia: Secondary | ICD-10-CM | POA: Diagnosis not present

## 2022-03-22 DIAGNOSIS — I129 Hypertensive chronic kidney disease with stage 1 through stage 4 chronic kidney disease, or unspecified chronic kidney disease: Secondary | ICD-10-CM | POA: Diagnosis not present

## 2022-03-22 DIAGNOSIS — E7800 Pure hypercholesterolemia, unspecified: Secondary | ICD-10-CM

## 2022-03-22 LAB — GLUCOSE, POCT (MANUAL RESULT ENTRY): POC Glucose: 226 mg/dl — AB (ref 70–99)

## 2022-03-22 LAB — POCT GLYCOSYLATED HEMOGLOBIN (HGB A1C): HbA1c, POC (controlled diabetic range): 11.8 % — AB (ref 0.0–7.0)

## 2022-03-22 MED ORDER — TECHLITE PEN NEEDLES 32G X 4 MM MISC
1.0000 | Freq: Every day | 0 refills | Status: DC
  Filled 2022-03-22 – 2022-04-13 (×2): qty 100, 90d supply, fill #0

## 2022-03-22 MED ORDER — AMLODIPINE BESYLATE 10 MG PO TABS
10.0000 mg | ORAL_TABLET | Freq: Every day | ORAL | 1 refills | Status: DC
  Filled 2022-03-22: qty 90, 90d supply, fill #0
  Filled 2022-06-15: qty 90, 90d supply, fill #1

## 2022-03-22 MED ORDER — ATORVASTATIN CALCIUM 10 MG PO TABS
10.0000 mg | ORAL_TABLET | Freq: Every day | ORAL | 1 refills | Status: DC
  Filled 2022-03-22: qty 90, 90d supply, fill #0
  Filled 2022-06-15: qty 30, 30d supply, fill #1
  Filled 2022-08-02: qty 60, 60d supply, fill #2

## 2022-03-22 MED ORDER — BASAGLAR KWIKPEN 100 UNIT/ML ~~LOC~~ SOPN
20.0000 [IU] | PEN_INJECTOR | Freq: Every day | SUBCUTANEOUS | 6 refills | Status: DC
  Filled 2022-03-22: qty 30, 150d supply, fill #0
  Filled 2022-04-13: qty 9, 45d supply, fill #0
  Filled 2022-06-02: qty 9, 45d supply, fill #1
  Filled 2022-07-14: qty 9, 45d supply, fill #2
  Filled 2022-08-29: qty 9, 45d supply, fill #3

## 2022-03-22 MED ORDER — GLIMEPIRIDE 4 MG PO TABS
4.0000 mg | ORAL_TABLET | Freq: Every day | ORAL | 1 refills | Status: DC
  Filled 2022-03-22: qty 90, 90d supply, fill #0
  Filled 2022-06-15: qty 90, 90d supply, fill #1

## 2022-03-22 MED ORDER — ACCU-CHEK GUIDE W/DEVICE KIT
PACK | 0 refills | Status: DC
  Filled 2022-03-22: qty 1, fill #0

## 2022-03-22 MED ORDER — ACCU-CHEK SOFTCLIX LANCETS MISC
0 refills | Status: DC
  Filled 2022-03-22: qty 100, fill #0

## 2022-03-22 MED ORDER — FERROUS SULFATE 325 (65 FE) MG PO TABS
325.0000 mg | ORAL_TABLET | Freq: Every day | ORAL | 3 refills | Status: DC
  Filled 2022-03-22: qty 90, 90d supply, fill #0
  Filled 2022-06-15: qty 90, 90d supply, fill #1
  Filled 2022-09-16: qty 90, 90d supply, fill #2
  Filled 2022-11-16: qty 90, 90d supply, fill #3

## 2022-03-22 MED ORDER — ACCU-CHEK GUIDE VI STRP
ORAL_STRIP | 11 refills | Status: DC
  Filled 2022-03-22: qty 100, fill #0

## 2022-03-22 NOTE — Progress Notes (Signed)
F/u BP and DM

## 2022-03-22 NOTE — Progress Notes (Signed)
Assessment & Plan:  Joseph Guiana was seen today for diabetes and hypertension.  Diagnoses and all orders for this visit:  Type 2 diabetes mellitus with hyperglycemia, without long-term current use of insulin (HCC) -     HgB A1c -     Glucose (CBG) -     Insulin Glargine (BASAGLAR KWIKPEN) 100 UNIT/ML; Inject 20 Units into the skin daily. -     glimepiride (AMARYL) 4 MG tablet; Take 1 tablet (4 mg total) by mouth daily before breakfast. -     Blood Glucose Monitoring Suppl (ACCU-CHEK GUIDE) w/Device KIT; Check blood sugar three times daily. -     glucose blood (ACCU-CHEK GUIDE) test strip; Use as instructed -     Accu-Chek Softclix Lancets lancets; Use to check blood sugars three times per day -     atorvastatin (LIPITOR) 10 MG tablet; Take 1 tablet (10 mg total) by mouth daily. In the evening -     Insulin Pen Needle (TECHLITE PEN NEEDLES) 32G X 4 MM MISC; Use 1 pen tip to inject insulin once daily. -     CMP14+EGFR -     Microalbumin / creatinine urine ratio  Hypertension associated with stage 2 chronic kidney disease due to type 2 diabetes mellitus (HCC) -     amLODipine (NORVASC) 10 MG tablet; Take 1 tablet (10 mg total) by mouth daily. -     CMP14+EGFR  Stage 3b chronic kidney disease (HCC) -     Accu-Chek Softclix Lancets lancets; Use to check blood sugars three times per day  Anemia, unspecified type -     ferrous sulfate (FEROSUL) 325 (65 FE) MG tablet; Take 1 tablet (325 mg total) by mouth daily with breakfast. -     CBC with Differential  Hypercholesteremia -     Lipid panel  Need for hepatitis C screening test -     HCV Ab w Reflex to Quant PCR  Need for Tdap vaccination -     Tdap vaccine greater than or equal to 7yo IM -     Interpretation:    Patient has been counseled on age-appropriate routine health concerns for screening and prevention. These are reviewed and up-to-date. Referrals have been placed accordingly. Immunizations are up-to-date or declined.     Subjective:   Chief Complaint  Patient presents with   Diabetes   Hypertension   HPI Joseph [name] 76 y.o. male presents to office today for follow up to DM, HTN and HPL. He is a patient of Dr. Smitty Pluck with last office visit 03-2021 He has been out of several of his medications which have been renewed today.   He has a PMH of DM 2,  b/l BKA, HTN, stage II CKD, HPL   HTN Blood pressure is elevated today. Will refill amlodipine 10 mg today. May need adjunct medication if not controlled with CCB BP Readings from Last 3 Encounters:  03/22/22 (!) 165/74  04/01/21 124/74  03/10/21 112/63    DM 2 Poorly controlled. Will increase basaglar to 20 units and he will continue on glimepiride 4 mg daily. He could not tolerate metformin. May also need to start GLP1 if no improvement with increase in basaglar and resumption of glimepiride. LDL not at goal with atorvastatin 10 mg. May need to increase dosage if next lipid panel elevated after restarting statin today.  Lab Results  Component Value Date   HGBA1C 11.8 (A) 03/22/2022    Lab Results  Component Value Date   [place]  90 03/22/2022       Review of Systems  Constitutional:  Negative for fever, malaise/fatigue and weight loss.  HENT: Negative.  Negative for nosebleeds.   Eyes: Negative.  Negative for blurred vision, double vision and photophobia.  Respiratory: Negative.  Negative for cough and shortness of breath.   Cardiovascular: Negative.  Negative for chest pain, palpitations and leg swelling.  Gastrointestinal: Negative.  Negative for heartburn, nausea and vomiting.  Musculoskeletal: Negative.  Negative for myalgias.  Neurological: Negative.  Negative for dizziness, focal weakness, seizures and headaches.  Psychiatric/Behavioral: Negative.  Negative for suicidal ideas.     Past Medical History:  Diagnosis Date   Cellulitis of right lower extremity    Diabetes mellitus type 2 in nonobese [facility])    Gangrene of right foot ([place])     Hypertension    Open wound of right foot     Past Surgical History:  Procedure Laterality Date   ABDOMINAL AORTOGRAM W/LOWER EXTREMITY Left 01/27/2021   Procedure: ABDOMINAL AORTOGRAM W/LOWER EXTREMITY;  Surgeon: Cherre Robins, MD;  Location: [place] [facility];  Service: Cardiovascular;  Laterality: Left;   AMPUTATION Right 11/16/2018   Procedure: RIGHT BELOW KNEE AMPUTATION;  Surgeon: Newt Minion, MD;  Location: [place];  Service: Orthopedics;  Laterality: Right;   AMPUTATION Left 02/03/2021   Procedure: LEFT BELOW KNEE AMPUTATION;  Surgeon: Newt Minion, MD;  Location: [place];  Service: Orthopedics;  Laterality: Left;   APPLICATION OF WOUND VAC  02/03/2021   Procedure: APPLICATION OF WOUND VAC;  Surgeon: Newt Minion, MD;  Location: [facility] OR;  Service: Orthopedics;;   HAND SURGERY      Family History  Problem Relation Age of Onset   Diabetes Father    Cancer Neg Hx    CAD Neg Hx     Social History Reviewed with no changes to be made today.   Outpatient Medications Prior to Visit  Medication Sig Dispense Refill   aspirin EC 81 MG EC tablet Take 1 tablet (81 mg total) by mouth daily. Swallow whole. 30 tablet 11   Misc. Devices MISC leg extension and foot rest for the left side. 1 each 0   Multiple Vitamins-Minerals (ONE-A-DAY MENS 50+ ADVANTAGE) TABS Take 1 tablet by mouth daily with breakfast.     polyethylene glycol (MIRALAX / GLYCOLAX) 17 g packet Take 17 g by mouth daily as needed for mild constipation. 14 each 0   Accu-Chek Softclix Lancets lancets Use to check blood sugars three times per day (Patient not taking: Reported on 03/22/2022) 100 each 0   amLODipine (NORVASC) 10 MG tablet Take 1 tablet (10 mg total) by mouth daily. (Patient not taking: Reported on 03/22/2022) 30 tablet 0   ascorbic acid (VITAMIN C) 1000 MG tablet Take 1 tablet (1,000 mg total) by mouth daily. (Patient not taking: Reported on 03/22/2022) 30 tablet 0   atorvastatin (LIPITOR) 10 MG tablet Take 1  tablet (10 mg total) by mouth daily. In the evening 30 tablet 0   Blood Glucose Monitoring Suppl (ACCU-CHEK GUIDE) w/Device KIT Check blood sugar three times daily. 1 kit 0   diclofenac Sodium (VOLTAREN) 1 % GEL Apply 2 g topically 3 (three) times daily. 200 g 1   ferrous sulfate (FEROSUL) 325 (65 FE) MG tablet Take 1 tablet (325 mg total) by mouth daily with breakfast. 30 tablet 0   glucose blood (ACCU-CHEK GUIDE) test strip Use as instructed 100 each 11   Insulin Glargine (BASAGLAR KWIKPEN)  100 UNIT/ML Inject 15 Units into the skin daily. 30 mL 6   Insulin Pen Needle (TECHLITE PEN NEEDLES) 32G X 4 MM MISC Use 1 pen tip to inject insulin once daily. 100 each 0   oxyCODONE (OXY IR/ROXICODONE) 5 MG immediate release tablet Take 1 tablet (5 mg total) by mouth every 4 (four) hours as needed for moderate pain. 30 tablet 0   Zinc Sulfate 220 (50 Zn) MG TABS Take 1 tablet (220 mg total) by mouth daily. 30 tablet 0   No facility-administered medications prior to visit.    Allergies  Allergen Reactions   Other Other (See Comments)    04/02/21 Mr Daubert reports a few previous Flu vaccines caused him to have increased symptoms "to get it" Does not prefer to have the flu vaccine but confirms he practices pandemic precautions (masks, distancing, hand washing)       Objective:    BP (!) 165/74 (BP Location: Left Arm, Patient Position: Sitting, Cuff Size: Large)   Pulse 65   Resp 16   Ht _0  (1.905 m)   Wt 227 lb (103 kg)   SpO2 99%   BMI 28.37 kg/m  Wt Readings from Last 3 Encounters:  03/22/22 227 lb (103 kg)  02/04/21 218 lb 7.6 oz (99.1 kg)  02/03/21 220 lb 7.4 oz (100 kg)    Physical Exam Vitals and nursing note reviewed.  Constitutional:      Appearance: He is well-developed.  HENT:     Head: Normocephalic and atraumatic.  Cardiovascular:     Rate and Rhythm: Normal rate and regular rhythm.     Heart sounds: Normal heart sounds. No murmur heard.    No friction rub. No gallop.   Pulmonary:     Effort: Pulmonary effort is normal. No tachypnea or respiratory distress.     Breath sounds: Normal breath sounds. No decreased breath sounds, wheezing, rhonchi or rales.  Chest:     Chest wall: No tenderness.  Abdominal:     General: Bowel sounds are normal.     Palpations: Abdomen is soft.  Musculoskeletal:        General: Normal range of motion.     Cervical back: Normal range of motion.     Right Lower Extremity: Right leg is amputated below knee.     Left Lower Extremity: Left leg is amputated below knee.  Skin:    General: Skin is warm and dry.  Neurological:     Mental Status: He is alert and oriented to person, place, and time.     Coordination: Coordination normal.  Psychiatric:        Behavior: Behavior normal. Behavior is cooperative.        Thought Content: Thought content normal.        Judgment: Judgment normal.          Patient has been counseled extensively about nutrition and exercise as well as the importance of adherence with medications and regular follow-up. The patient was given clear instructions to go to ER or return to medical center if symptoms don't improve, worsen or new problems develop. The patient verbalized understanding.   Follow-up: Return for 4 weeks see luke with meter. See NEWLIN in 3 months.   Gildardo Pounds, FNP-BC [facility], [place]   03/27/2022, 7:06 PM

## 2022-03-23 ENCOUNTER — Other Ambulatory Visit: Payer: Self-pay

## 2022-03-24 LAB — CBC WITH DIFFERENTIAL/PLATELET
Basophils Absolute: 0 10*3/uL (ref 0.0–0.2)
Basos: 0 %
EOS (ABSOLUTE): 0.2 10*3/uL (ref 0.0–0.4)
Eos: 2 %
Hematocrit: 40.6 % (ref 37.5–51.0)
Hemoglobin: 13.3 g/dL (ref 13.0–17.7)
Immature Grans (Abs): 0 10*3/uL (ref 0.0–0.1)
Immature Granulocytes: 0 %
Lymphocytes Absolute: 5.1 10*3/uL — ABNORMAL HIGH (ref 0.7–3.1)
Lymphs: 52 %
MCH: 30.6 pg (ref 26.6–33.0)
MCHC: 32.8 g/dL (ref 31.5–35.7)
MCV: 94 fL (ref 79–97)
Monocytes Absolute: 0.6 10*3/uL (ref 0.1–0.9)
Monocytes: 6 %
Neutrophils Absolute: 4 10*3/uL (ref 1.4–7.0)
Neutrophils: 40 %
Platelets: 224 10*3/uL (ref 150–450)
RBC: 4.34 x10E6/uL (ref 4.14–5.80)
RDW: 12 % (ref 11.6–15.4)
WBC: 9.9 10*3/uL (ref 3.4–10.8)

## 2022-03-24 LAB — LIPID PANEL
Chol/HDL Ratio: 2.9 ratio (ref 0.0–5.0)
Cholesterol, Total: 168 mg/dL (ref 100–199)
HDL: 57 mg/dL (ref >39)
LDL Chol Calc (NIH): 90 mg/dL (ref 0–99)
Triglycerides: 116 mg/dL (ref 0–149)
VLDL Cholesterol Cal: 21 mg/dL (ref 5–40)

## 2022-03-24 LAB — CMP14+EGFR
ALT: 15 IU/L (ref 0–44)
AST: 23 IU/L (ref 0–40)
Albumin/Globulin Ratio: 1.4 (ref 1.2–2.2)
Albumin: 4.5 g/dL (ref 3.8–4.8)
Alkaline Phosphatase: 57 IU/L (ref 44–121)
BUN/Creatinine Ratio: 12 (ref 10–24)
BUN: 16 mg/dL (ref 8–27)
Bilirubin Total: 0.4 mg/dL (ref 0.0–1.2)
CO2: 24 mmol/L (ref 20–29)
Calcium: 9.9 mg/dL (ref 8.6–10.2)
Chloride: 102 mmol/L (ref 96–106)
Creatinine, Ser: 1.32 mg/dL — ABNORMAL HIGH (ref 0.76–1.27)
Globulin, Total: 3.2 g/dL (ref 1.5–4.5)
Glucose: 201 mg/dL — ABNORMAL HIGH (ref 70–99)
Potassium: 5.1 mmol/L (ref 3.5–5.2)
Sodium: 141 mmol/L (ref 134–144)
Total Protein: 7.7 g/dL (ref 6.0–8.5)
eGFR: 57 mL/min/{1.73_m2} — ABNORMAL LOW (ref >59)

## 2022-03-24 LAB — HCV INTERPRETATION

## 2022-03-24 LAB — MICROALBUMIN / CREATININE URINE RATIO
Creatinine, Urine: 138 mg/dL
Microalb/Creat Ratio: 24 mg/g creat (ref 0–29)
Microalbumin, Urine: 32.6 ug/mL

## 2022-03-24 LAB — HCV AB W REFLEX TO QUANT PCR: HCV Ab: NONREACTIVE

## 2022-03-27 ENCOUNTER — Encounter: Payer: Self-pay | Admitting: Nurse Practitioner

## 2022-04-13 ENCOUNTER — Other Ambulatory Visit: Payer: Self-pay

## 2022-04-29 ENCOUNTER — Ambulatory Visit: Payer: Medicare Other | Admitting: Pharmacist

## 2022-06-02 ENCOUNTER — Other Ambulatory Visit: Payer: Self-pay

## 2022-06-02 ENCOUNTER — Other Ambulatory Visit: Payer: Self-pay | Admitting: Nurse Practitioner

## 2022-06-02 DIAGNOSIS — E1165 Type 2 diabetes mellitus with hyperglycemia: Secondary | ICD-10-CM

## 2022-06-02 MED ORDER — TECHLITE PEN NEEDLES 32G X 4 MM MISC
1.0000 | Freq: Every day | 0 refills | Status: DC
  Filled 2022-06-02: qty 100, 90d supply, fill #0
  Filled 2022-07-14: qty 100, 100d supply, fill #0

## 2022-06-02 NOTE — Telephone Encounter (Signed)
Requested Prescriptions  Pending Prescriptions Disp Refills   Insulin Pen Needle (TECHLITE PEN NEEDLES) 32G X 4 MM MISC 100 each 0    Sig: Use 1 pen tip to inject insulin once daily.     Endocrinology: Diabetes - Testing Supplies Passed - 06/02/2022 10:36 AM      Passed - Valid encounter within last 12 months    Recent Outpatient Visits           2 months ago Type 2 diabetes mellitus with hyperglycemia, without long-term current use of insulin [facility])   [place], [place] W, NP   1 year ago Type 2 diabetes mellitus with hyperglycemia, without long-term current use of insulin ([place])   [place], Charlane Ferretti, MD   1 year ago Type 2 diabetes mellitus with hyperglycemia, without long-term current use of insulin [facility])   [place], [place], [place]   1 year ago Type 2 diabetes mellitus with hyperglycemia, without long-term current use of insulin [facility])   [place], [place], [place]   2 years ago Type 2 diabetes mellitus with hyperglycemia, without long-term current use of insulin [facility])   [place], [place]ne [place], [place]       Future Appointments             In 2 weeks Charlott Rakes, MD [place]   In 3 weeks Daisy Blossom, Jarome [place], [place]

## 2022-06-15 ENCOUNTER — Other Ambulatory Visit: Payer: Self-pay

## 2022-06-22 ENCOUNTER — Ambulatory Visit: Payer: Self-pay | Admitting: Family Medicine

## 2022-06-22 ENCOUNTER — Ambulatory Visit: Payer: Medicare Other | Admitting: Family Medicine

## 2022-06-28 ENCOUNTER — Ambulatory Visit: Payer: Medicare Other | Admitting: Pharmacist

## 2022-07-14 ENCOUNTER — Other Ambulatory Visit: Payer: Self-pay

## 2022-07-29 ENCOUNTER — Other Ambulatory Visit: Payer: Self-pay

## 2022-07-29 ENCOUNTER — Ambulatory Visit: Payer: Medicare Other | Attending: Family Medicine | Admitting: Pharmacist

## 2022-07-29 DIAGNOSIS — E785 Hyperlipidemia, unspecified: Secondary | ICD-10-CM | POA: Diagnosis not present

## 2022-07-29 DIAGNOSIS — E1151 Type 2 diabetes mellitus with diabetic peripheral angiopathy without gangrene: Secondary | ICD-10-CM | POA: Diagnosis not present

## 2022-07-29 DIAGNOSIS — N189 Chronic kidney disease, unspecified: Secondary | ICD-10-CM | POA: Insufficient documentation

## 2022-07-29 DIAGNOSIS — Z89511 Acquired absence of right leg below knee: Secondary | ICD-10-CM | POA: Diagnosis not present

## 2022-07-29 DIAGNOSIS — Z833 Family history of diabetes mellitus: Secondary | ICD-10-CM | POA: Insufficient documentation

## 2022-07-29 DIAGNOSIS — Z7984 Long term (current) use of oral hypoglycemic drugs: Secondary | ICD-10-CM | POA: Insufficient documentation

## 2022-07-29 DIAGNOSIS — I5030 Unspecified diastolic (congestive) heart failure: Secondary | ICD-10-CM | POA: Insufficient documentation

## 2022-07-29 DIAGNOSIS — I130 Hypertensive heart and chronic kidney disease with heart failure and stage 1 through stage 4 chronic kidney disease, or unspecified chronic kidney disease: Secondary | ICD-10-CM | POA: Diagnosis not present

## 2022-07-29 DIAGNOSIS — Z89512 Acquired absence of left leg below knee: Secondary | ICD-10-CM | POA: Insufficient documentation

## 2022-07-29 DIAGNOSIS — E1165 Type 2 diabetes mellitus with hyperglycemia: Secondary | ICD-10-CM | POA: Diagnosis not present

## 2022-07-29 DIAGNOSIS — E1122 Type 2 diabetes mellitus with diabetic chronic kidney disease: Secondary | ICD-10-CM | POA: Diagnosis not present

## 2022-07-29 DIAGNOSIS — Z79899 Other long term (current) drug therapy: Secondary | ICD-10-CM | POA: Insufficient documentation

## 2022-07-29 MED ORDER — DAPAGLIFLOZIN PROPANEDIOL 10 MG PO TABS
10.0000 mg | ORAL_TABLET | Freq: Every day | ORAL | 3 refills | Status: DC
  Filled 2022-07-29 – 2022-08-03 (×2): qty 30, 30d supply, fill #0
  Filled 2022-08-29: qty 30, 30d supply, fill #1

## 2022-07-29 NOTE — Progress Notes (Signed)
    S:     No chief complaint on file.  77 y.o. male who presents for diabetes evaluation, education, and management.  PMH is significant for [id number] complicated by CKD, hx of osteomyelitis S/p bilateral BKA, and neuropathy, HTN, PVD, PAD, diastolic heart failure (last EF on file from 2020 - 50-55%). Patient was referred and last seen by [name] on 03/22/2022. He has not been seen by his PCP, Dr. Margarita Rana, since 03/2021. His A1c in October was 11.8 (up from 7.6 previously). Of note, kidney function was stable and urine microalbumin was wnl. His glargine dose was increased.   Today, patient arrives in good spirits and presents without any assistance.  Patient reports Diabetes is longstanding. He could not tolerate metformin. He does not have a hx of SGLT-2i or GLP-1 RA use. No hx of thyroid cancer or pancreatitis. He dose peripheral vascular disease, HFpEF, and CKD. No known hx of ACS or stroke.   Family/Social History:  -Fhx: DM -Tobacco: never smoker -Alcohol: none reported   Current diabetes medications include: Basaglar 20u daily, glimepiride 4 mg daily  Current hypertension medications include: amlodipine 10 mg daily Current hyperlipidemia medications include: atorvastatin 10 mg daily  Patient reports adherence to taking all medications as prescribed.   Insurance coverage: Medicare  Patient denies hypoglycemic events.  Reported home blood sugars: endorses range of low 100s-170s. Will occasionally see levels in the low 200s after meals.    Patient denies nocturia (nighttime urination).  Patient denies neuropathy (nerve pain). Patient reports visual changes. Patient reports self foot exams.   Patient reported dietary habits:  -Admits to snacking on things like potato chips but tries to limit this.  -Eats sandwiches  and other snack foods later at night but tries to limit this. -Stays away from sweets and sugar-sweetened beverages.   Patient-reported exercise habits:  -Mobility is  limited with hx of bilateral BKA   O:  No GM with her.   No CGM in place.  Lab Results  Component Value Date   HGBA1C 11.8 (A) 03/22/2022   There were no vitals filed for this visit.  Lipid Panel     Component Value Date/Time   CHOL 168 03/22/2022 1211   TRIG 116 03/22/2022 1211   HDL 57 03/22/2022 1211   CHOLHDL 2.9 03/22/2022 1211   CHOLHDL 4.7 11/14/2018 0006   VLDL 9 11/14/2018 0006   LDLCALC 90 03/22/2022 1211    Clinical Atherosclerotic Cardiovascular Disease (ASCVD): Yes  - PAD  A/P: Diabetes longstanding currently uncontrolled. Will get A1c updated today. Patient is able to verbalize appropriate hypoglycemia management plan. Medication adherence appears appropriate. -Continued Semglee, glimepiride at current doses.  -Start Farxiga '10mg'$  daily given his comorbid CKD and HFpEF. Will need update renal function in 4-6 weeks.  -Patient educated on purpose, proper use, and potential adverse effects of [place]. Emphasized hydration and discussed the appropriateness of sick day rules.   -Extensively discussed pathophysiology of diabetes, recommended lifestyle interventions, dietary effects on blood sugar control.  -Counseled on s/sx of and management of hypoglycemia.  -A1c, BMP8 +eGFR today.   Written patient instructions provided. Patient verbalized understanding of treatment plan.  Total time in face to face counseling 30 minutes.    Follow-up:  Pharmacist in 1 month. PCP clinic visit 09/08/2022.    Benard Halsted, PharmD, [place], [place] [phone]

## 2022-07-30 LAB — BMP8+EGFR
BUN/Creatinine Ratio: 11 (ref 10–24)
BUN: 17 mg/dL (ref 8–27)
CO2: 20 mmol/L (ref 20–29)
Calcium: 9.8 mg/dL (ref 8.6–10.2)
Chloride: 111 mmol/L — ABNORMAL HIGH (ref 96–106)
Creatinine, Ser: 1.57 mg/dL — ABNORMAL HIGH (ref 0.76–1.27)
Glucose: 79 mg/dL (ref 70–99)
Potassium: 4.7 mmol/L (ref 3.5–5.2)
Sodium: 146 mmol/L — ABNORMAL HIGH (ref 134–144)
eGFR: 46 mL/min/{1.73_m2} — ABNORMAL LOW (ref >59)

## 2022-07-30 LAB — HEMOGLOBIN A1C
Est. average glucose Bld gHb Est-mCnc: 240 mg/dL
Hgb A1c MFr Bld: 10 % — ABNORMAL HIGH (ref 4.8–5.6)

## 2022-08-02 ENCOUNTER — Other Ambulatory Visit: Payer: Self-pay

## 2022-08-03 ENCOUNTER — Other Ambulatory Visit: Payer: Self-pay

## 2022-08-05 ENCOUNTER — Other Ambulatory Visit: Payer: Self-pay

## 2022-08-16 ENCOUNTER — Other Ambulatory Visit: Payer: Self-pay

## 2022-08-29 ENCOUNTER — Other Ambulatory Visit: Payer: Self-pay

## 2022-08-29 ENCOUNTER — Other Ambulatory Visit: Payer: Self-pay | Admitting: Pharmacist

## 2022-08-29 ENCOUNTER — Other Ambulatory Visit: Payer: Self-pay | Admitting: Nurse Practitioner

## 2022-08-29 DIAGNOSIS — E1165 Type 2 diabetes mellitus with hyperglycemia: Secondary | ICD-10-CM

## 2022-08-29 MED ORDER — TECHLITE PEN NEEDLES 32G X 4 MM MISC
1.0000 | Freq: Every day | 0 refills | Status: DC
  Filled 2022-08-29 – 2022-10-12 (×2): qty 100, 100d supply, fill #0

## 2022-08-29 NOTE — Progress Notes (Signed)
Joseph [name], Joseph [name], Student-PharmD  Osker Mason, RPH-CPP  Patient outreached by [name], PharmD Candidate on 08/29/2022 to discuss hypertension.   Patient has an automated home blood pressure machine. They report home readings SBP 125-130s after taking BP medication.   Medication review was performed. They are taking medications as prescribed.  The following barriers to adherence were noted: - They do have cost concerns. Says they are having difficulties affording medications and attempting to obtain supplementary insurance. Currently paying off multiple amputations, which is a cost burden. - They do have transportation concerns. Patient reports they have a way to get to their next appointment and have discussed transportation services with their provider. - They do need assistance obtaining refills. - They do not occasionally forget to take some of their prescribed medications. - They do not feel like one/some of their medications make them feel poorly. - They do not have questions or concerns about their medications. - They do have follow up scheduled with their primary care provider/cardiologist.  The following interventions were completed: - Medications were reviewed - Patient was educated on goal blood pressures and long term health implications of elevated blood pressure - Patient was educated on proper technique to check home blood pressure and reminded to bring home machine and readings to next provider appointment - Patient was educated on medications, including indication and administration  The patient has follow up scheduled: 09/02/2022 with Dr. Dutch Gray PharmD Candidate  Catie Hedwig Morton, PharmD, [place], [facility] [phone]

## 2022-08-31 ENCOUNTER — Ambulatory Visit: Payer: Medicare Other | Admitting: Family Medicine

## 2022-09-02 ENCOUNTER — Other Ambulatory Visit: Payer: Self-pay

## 2022-09-02 ENCOUNTER — Ambulatory Visit: Payer: Medicare Other | Attending: Family Medicine | Admitting: Pharmacist

## 2022-09-02 ENCOUNTER — Encounter: Payer: Self-pay | Admitting: Pharmacist

## 2022-09-02 DIAGNOSIS — N1832 Chronic kidney disease, stage 3b: Secondary | ICD-10-CM | POA: Diagnosis not present

## 2022-09-02 DIAGNOSIS — E1165 Type 2 diabetes mellitus with hyperglycemia: Secondary | ICD-10-CM

## 2022-09-02 DIAGNOSIS — I2510 Atherosclerotic heart disease of native coronary artery without angina pectoris: Secondary | ICD-10-CM | POA: Insufficient documentation

## 2022-09-02 DIAGNOSIS — E119 Type 2 diabetes mellitus without complications: Secondary | ICD-10-CM | POA: Diagnosis not present

## 2022-09-02 DIAGNOSIS — N186 End stage renal disease: Secondary | ICD-10-CM | POA: Diagnosis not present

## 2022-09-02 MED ORDER — ACCU-CHEK SOFTCLIX LANCETS MISC
11 refills | Status: DC
  Filled 2022-09-02: qty 100, fill #0

## 2022-09-02 MED ORDER — ACCU-CHEK GUIDE VI STRP
ORAL_STRIP | 11 refills | Status: DC
  Filled 2022-09-02: qty 100, 25d supply, fill #0
  Filled 2023-02-15: qty 100, 25d supply, fill #1

## 2022-09-02 MED ORDER — ACCU-CHEK GUIDE W/DEVICE KIT
PACK | 0 refills | Status: AC
  Filled 2022-09-02: qty 1, 1d supply, fill #0

## 2022-09-02 NOTE — Progress Notes (Signed)
    S:     No chief complaint on file.  77 y.o. male who presents for diabetes evaluation, education, and management.  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 Patient was referred and last seen by [name] on 03/22/2022. He has not been seen by his PCP, Dr. Alvis Lemmings, since 03/2021. His A1c in March was 10 (down from 11.8 previously). Of note, kidney function was slightly increased to 1.57 from 1.3 in 2023 and urine microalbumin was wnl. Patient was seen by pharmacy 07/29/22 and started [place].   Today, patient arrives in good spirits and presents without any assistance.  Patient reports diabetes is longstanding. He could not tolerate metformin. He does not have a hx of GLP-1 RA use. No hx of thyroid cancer or pancreatitis. He dose peripheral vascular disease, HFpEF, and CKD. No known hx of ACS or stroke.   Family/Social History:  -Fhx: DM -Tobacco: never smoker -Alcohol: none reported   Current diabetes medications include: Basaglar 20u daily, glimepiride 4 mg daily, Farxiga 10 mg  Current hypertension medications include: amlodipine 10 mg daily Current hyperlipidemia medications include: atorvastatin 10 mg daily  Patient reports adherence to taking all medications as prescribed.   Insurance coverage: Medicare A and B  Patient denies hypoglycemic events.   Reported home blood sugars: endorses range of 115-145, up to 187 Of note, meter sent October 2023 was not picked up from the pharmacy.   Patient denies nocturia (nighttime urination).  Patient denies neuropathy (nerve pain). Patient reports visual changes. Patient reports self foot exams.   Patient reported dietary habits:  -Admits to snacking on things like potato chips but tries to limit this.  -Eats sandwiches throughout the day and snacks on popcorn at night. -Stays away from sweets and  sugar-sweetened beverages.   Patient-reported exercise habits:  -Mobility is limited with hx of bilateral BKA -Exercise daily walking the dog and doing yard work  O:  No GM with patient.  No CGM in place.  Lab Results  Component Value Date   HGBA1C 10.0 (H) 07/29/2022   There were no vitals filed for this visit.  Lipid Panel     Component Value Date/Time   CHOL 168 03/22/2022 1211   TRIG 116 03/22/2022 1211   HDL 57 03/22/2022 1211   CHOLHDL 2.9 03/22/2022 1211   CHOLHDL 4.7 11/14/2018 0006   VLDL 9 11/14/2018 0006   LDLCALC 90 03/22/2022 1211    Clinical Atherosclerotic Cardiovascular Disease (ASCVD): Yes  - PAD  A/P: Diabetes longstanding currently uncontrolled. Will get A1c updated today. Patient is able to verbalize appropriate hypoglycemia management plan. Medication adherence appears appropriate. -Continued Semglee, Farxiga, and glimepiride at current doses.  -Sent new prescription for meter and test strips to the pharmacy. -Extensively discussed pathophysiology of diabetes, recommended lifestyle interventions, dietary effects on blood sugar control.  -Counseled on s/sx of and management of hypoglycemia.   Written patient instructions provided. Patient verbalized understanding of treatment plan.  Total time in face to face counseling 30 minutes.    Follow-up:  PCP clinic visit 09/08/2022.   Georga Hacking, PharmD Clinical Pharmacist [facility] [phone]

## 2022-09-05 ENCOUNTER — Other Ambulatory Visit: Payer: Self-pay

## 2022-09-08 ENCOUNTER — Ambulatory Visit: Payer: Medicare Other | Attending: Family Medicine | Admitting: Family Medicine

## 2022-09-08 ENCOUNTER — Other Ambulatory Visit: Payer: Self-pay

## 2022-09-08 ENCOUNTER — Encounter: Payer: Self-pay | Admitting: Family Medicine

## 2022-09-08 ENCOUNTER — Telehealth: Payer: Self-pay

## 2022-09-08 ENCOUNTER — Telehealth: Payer: Self-pay | Admitting: Family Medicine

## 2022-09-08 VITALS — BP 146/76 | HR 60 | Ht 75.0 in | Wt 243.0 lb

## 2022-09-08 DIAGNOSIS — Z833 Family history of diabetes mellitus: Secondary | ICD-10-CM | POA: Insufficient documentation

## 2022-09-08 DIAGNOSIS — E1165 Type 2 diabetes mellitus with hyperglycemia: Secondary | ICD-10-CM

## 2022-09-08 DIAGNOSIS — Z89512 Acquired absence of left leg below knee: Secondary | ICD-10-CM | POA: Insufficient documentation

## 2022-09-08 DIAGNOSIS — Z1211 Encounter for screening for malignant neoplasm of colon: Secondary | ICD-10-CM

## 2022-09-08 DIAGNOSIS — E1122 Type 2 diabetes mellitus with diabetic chronic kidney disease: Secondary | ICD-10-CM | POA: Diagnosis not present

## 2022-09-08 DIAGNOSIS — N182 Chronic kidney disease, stage 2 (mild): Secondary | ICD-10-CM | POA: Diagnosis not present

## 2022-09-08 DIAGNOSIS — I129 Hypertensive chronic kidney disease with stage 1 through stage 4 chronic kidney disease, or unspecified chronic kidney disease: Secondary | ICD-10-CM | POA: Diagnosis not present

## 2022-09-08 LAB — GLUCOSE, POCT (MANUAL RESULT ENTRY): POC Glucose: 90 mg/dl (ref 70–99)

## 2022-09-08 MED ORDER — AMLODIPINE BESYLATE 10 MG PO TABS
10.0000 mg | ORAL_TABLET | Freq: Every day | ORAL | 1 refills | Status: DC
  Filled 2022-09-08: qty 90, 90d supply, fill #0
  Filled 2022-12-07 – 2022-12-14 (×2): qty 90, 90d supply, fill #1

## 2022-09-08 MED ORDER — ATORVASTATIN CALCIUM 10 MG PO TABS
10.0000 mg | ORAL_TABLET | Freq: Every day | ORAL | 1 refills | Status: DC
Start: 2022-09-08 — End: 2023-06-20
  Filled 2022-09-08 – 2022-12-15 (×2): qty 90, 90d supply, fill #0
  Filled 2023-03-16: qty 90, 90d supply, fill #1

## 2022-09-08 MED ORDER — GLIMEPIRIDE 4 MG PO TABS
4.0000 mg | ORAL_TABLET | Freq: Every day | ORAL | 1 refills | Status: DC
Start: 2022-09-08 — End: 2023-03-16
  Filled 2022-09-08: qty 90, 90d supply, fill #0
  Filled 2022-12-07 – 2022-12-14 (×2): qty 90, 90d supply, fill #1

## 2022-09-08 MED ORDER — DAPAGLIFLOZIN PROPANEDIOL 10 MG PO TABS
10.0000 mg | ORAL_TABLET | Freq: Every day | ORAL | 1 refills | Status: DC
  Filled 2022-09-08: qty 90, 90d supply, fill #0

## 2022-09-08 MED ORDER — BASAGLAR KWIKPEN 100 UNIT/ML ~~LOC~~ SOPN
20.0000 [IU] | PEN_INJECTOR | Freq: Every day | SUBCUTANEOUS | 6 refills | Status: DC
Start: 2022-09-08 — End: 2023-07-21
  Filled 2022-09-08: qty 30, 150d supply, fill #0
  Filled 2022-10-13: qty 9, 45d supply, fill #0
  Filled 2022-11-29: qty 9, 45d supply, fill #1
  Filled 2023-01-12: qty 9, 45d supply, fill #2
  Filled 2023-02-22: qty 6, 30d supply, fill #3
  Filled 2023-02-22: qty 9, 45d supply, fill #3
  Filled 2023-03-24: qty 6, 30d supply, fill #4
  Filled 2023-04-20: qty 6, 30d supply, fill #5
  Filled 2023-05-22: qty 6, 30d supply, fill #6
  Filled 2023-06-20: qty 6, 30d supply, fill #7

## 2022-09-08 NOTE — Progress Notes (Signed)
Subjective:  Patient ID: Joseph [name], male    DOB: [birth year]  Age: [age] y.o. MRN: [id number]  CC: Hypertension   HPI Joseph [name] is a 77 y.o. year old male with a history of type 2 diabetes mellitus (A1c 10.0), b/l BKA, hypertension, stage II CKD   Interval History:  Last seen by the clinical pharmacist 1 week ago for diabetes management and Marcelline DeistFarxiga was initiated. Blood sugars at home average around 120-130; fasting this morning was 117. He has no hypoglcemia and has no random sugars >200. Last eye exam was 1 year ago. Endorses adherence with his diabetes regimen.  His BP is elevated and he did not take his antihypertensive today. He has no chest pain, no dyspnea and has no additional concerns today. Past Medical History:  Diagnosis Date   Cellulitis of right lower extremity    Diabetes mellitus type 2 in nonobese    Gangrene of right foot    Hypertension    Open wound of right foot     Past Surgical History:  Procedure Laterality Date   ABDOMINAL AORTOGRAM W/LOWER EXTREMITY Left 01/27/2021   Procedure: ABDOMINAL AORTOGRAM W/LOWER EXTREMITY;  Surgeon: Joseph DouglasHawken, Thomas N, MD;  Location: [facility];  Service: Cardiovascular;  Laterality: Left;   AMPUTATION Right 11/16/2018   Procedure: RIGHT BELOW KNEE AMPUTATION;  Surgeon: Joseph Mustarduda, Marcus V, MD;  [facility];  Service: Orthopedics;  Laterality: Right;   AMPUTATION Left 02/03/2021   Procedure: LEFT BELOW KNEE AMPUTATION;  Surgeon: Joseph Mustarduda, Marcus V, MD;  [facility];  Service: Orthopedics;  Laterality: Left;   APPLICATION OF WOUND VAC  02/03/2021   Procedure: APPLICATION OF WOUND VAC;  Surgeon: Joseph Mustarduda, Marcus V, MD;  Location: [facility] OR;  Service: Orthopedics;;   HAND SURGERY      Family History  Problem Relation Age of Onset   Diabetes Father    Cancer Neg Hx    CAD Neg Hx     Social History   Socioeconomic History   Marital status: Divorced    Spouse name: Not on file   Number of children: Not on file   Years of  education: Not on file   Highest education level: Not on file  Occupational History   Occupation: retired     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  Tobacco Use   Smoking status: Never   Smokeless tobacco: Never  Building services engineerVaping Use   Vaping Use: Never used  Substance and Sexual Activity   Alcohol use: Not Currently   Drug use: No   Sexual activity: Not on file  Other Topics Concern   Not on file  Social History Narrative   Lives alone, 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   On fixed Social security income   Has a daughter who is a LawyerCNA and son does not speak with them every day   Most reliable family members reported to live in [place]   Divorced but remains friends with his ex wife, Joseph [name]   Has advance directives but has not as 11/03/20 given a copy to [facility]   Goes to church      Social Determinants of Health   Financial Resource Strain: Low Risk  (09/02/2022)   Overall Financial Resource Strain (CARDIA)    Difficulty of Paying Living Expenses: Not hard at all  Food Insecurity: Food Insecurity Present (09/02/2022)   Hunger Vital Sign    Worried About Running Out of Food in the Last Year:  Sometimes true    Ran Out of Food in the Last Year: Sometimes true  Transportation Needs: No Transportation Needs (09/02/2022)   PRAPARE - Administrator, Civil Service (Medical): No    Lack of Transportation (Non-Medical): No  Physical Activity: Inactive (09/02/2022)   Exercise Vital Sign    Days of Exercise per Week: 0 days    Minutes of Exercise per Session: 0 min  Stress: No Stress Concern Present (06/03/2021)   Joseph [name] [employer] Occupational Health - Occupational Stress Questionnaire    Feeling of Stress : Only a little  Social Connections: Moderately Integrated (09/02/2022)   Social Connection and Isolation Panel [NHANES]    Frequency of Communication with Friends and Family: Twice a week    Frequency of Social Gatherings with Friends and Family: Twice a week     Attends Religious Services: 1 to 4 times per year    Active Member of [employer] or Organizations: Yes    Attends Banker Meetings: 1 to 4 times per year    Marital Status: Divorced    Allergies  Allergen Reactions   Other Other (See Comments)    04/02/21 [name] reports a few previous Flu vaccines caused him to have increased symptoms "to get it" Does not prefer to have the flu vaccine but confirms he practices pandemic precautions (masks, distancing, hand washing)    Outpatient Medications Prior to Visit  Medication Sig Dispense Refill   Accu-Chek Softclix Lancets lancets Use to check blood sugars three times per day 100 each 11   aspirin EC 81 MG EC tablet Take 1 tablet (81 mg total) by mouth daily. Swallow whole. 30 tablet 11   Blood Glucose Monitoring Suppl (ACCU-CHEK GUIDE) w/Device KIT Check blood sugar three times daily. 1 kit 0   ferrous sulfate (FEROSUL) 325 (65 FE) MG tablet Take 1 tablet (325 mg total) by mouth daily with breakfast. 90 tablet 3   glucose blood (ACCU-CHEK GUIDE) test strip Use as instructed 100 each 11   Insulin Pen Needle (TECHLITE PEN NEEDLES) 32G X 4 MM MISC Use 1 pen tip to inject insulin once daily. 100 each 0   Misc. Devices MISC leg extension and foot rest for the left side. 1 each 0   Multiple Vitamins-Minerals (ONE-A-DAY MENS 50+ ADVANTAGE) TABS Take 1 tablet by mouth daily with breakfast.     polyethylene glycol (MIRALAX / GLYCOLAX) 17 g packet Take 17 g by mouth daily as needed for mild constipation. 14 each 0   amLODipine (NORVASC) 10 MG tablet Take 1 tablet (10 mg total) by mouth daily. 90 tablet 1   atorvastatin (LIPITOR) 10 MG tablet Take 1 tablet (10 mg total) by mouth daily. In the evening 90 tablet 1   dapagliflozin propanediol (FARXIGA) 10 MG TABS tablet Take 1 tablet (10 mg total) by mouth daily before breakfast. 30 tablet 3   glimepiride (AMARYL) 4 MG tablet Take 1 tablet (4 mg total) by mouth daily before breakfast. 90 tablet 1    Insulin Glargine (BASAGLAR KWIKPEN) 100 UNIT/ML Inject 20 Units into the skin daily. 30 mL 6   No facility-administered medications prior to visit.     ROS Review of Systems  Constitutional:  Negative for activity change and appetite change.  HENT:  Negative for sinus pressure and sore throat.   Respiratory:  Negative for chest tightness, shortness of breath and wheezing.   Cardiovascular:  Negative for chest pain and palpitations.  Gastrointestinal:  Negative for  abdominal distention, abdominal pain and constipation.  Genitourinary: Negative.   Musculoskeletal: Negative.   Psychiatric/Behavioral:  Negative for behavioral problems and dysphoric mood.     Objective:  BP (!) 146/76   Pulse 60   Ht 6\' 3"  (1.905 m)   Wt 243 lb (110.2 kg)   SpO2 98%   BMI 30.37 kg/m      09/08/2022   11:54 AM 09/08/2022   11:20 AM 03/22/2022   11:30 AM  BP/Weight  Systolic BP 146 152 165  Diastolic BP 76 74 74  Wt. (Lbs)  243 227  BMI  30.37 kg/m2 28.37 kg/m2      Physical Exam Constitutional:      Appearance: He is well-developed.  Cardiovascular:     Rate and Rhythm: Normal rate.     Heart sounds: Normal heart sounds. No murmur heard. Pulmonary:     Effort: Pulmonary effort is normal.     Breath sounds: Normal breath sounds. No wheezing or rales.  Chest:     Chest wall: No tenderness.  Abdominal:     General: Bowel sounds are normal. There is no distension.     Palpations: Abdomen is soft. There is no mass.     Tenderness: There is no abdominal tenderness.  Musculoskeletal:        General: Normal range of motion.     Right lower leg: No edema.     Left lower leg: No edema.  Neurological:     Mental Status: He is alert and oriented to person, place, and time.  Psychiatric:        Mood and Affect: Mood normal.        Latest Ref Rng & Units 07/29/2022    3:27 PM 03/22/2022   12:11 PM 02/10/2021    5:03 AM  CMP  Glucose 70 - 99 mg/dL 79  111  552   BUN 8 - 27 mg/dL 17  16   31    Creatinine 0.76 - 1.27 mg/dL 0.80  2.23  3.61   Sodium 134 - 144 mmol/L 146  141  133   Potassium 3.5 - 5.2 mmol/L 4.7  5.1  4.3   Chloride 96 - 106 mmol/L 111  102  97   CO2 20 - 29 mmol/L 20  24  28    Calcium 8.6 - 10.2 mg/dL 9.8  9.9  9.3   Total Protein 6.0 - 8.5 g/dL  7.7    Total Bilirubin 0.0 - 1.2 mg/dL  0.4    Alkaline Phos 44 - 121 IU/L  57    AST 0 - 40 IU/L  23    ALT 0 - 44 IU/L  15      Lipid Panel     Component Value Date/Time   CHOL 168 03/22/2022 1211   TRIG 116 03/22/2022 1211   HDL 57 03/22/2022 1211   CHOLHDL 2.9 03/22/2022 1211   CHOLHDL 4.7 11/14/2018 0006   VLDL 9 11/14/2018 0006   LDLCALC 90 03/22/2022 1211    CBC    Component Value Date/Time   WBC 9.9 03/22/2022 1211   WBC 12.7 (H) 02/10/2021 0503   RBC 4.34 03/22/2022 1211   RBC 3.36 (L) 02/10/2021 0503   HGB 13.3 03/22/2022 1211   HCT 40.6 03/22/2022 1211   PLT 224 03/22/2022 1211   MCV 94 03/22/2022 1211   MCH 30.6 03/22/2022 1211   MCH 29.8 02/10/2021 0503   MCHC 32.8 03/22/2022 1211   MCHC 33.2 02/10/2021 0503  RDW 12.0 03/22/2022 1211   LYMPHSABS 5.1 (H) 03/22/2022 1211   MONOABS 0.5 02/10/2021 0503   EOSABS 0.2 03/22/2022 1211   BASOSABS 0.0 03/22/2022 1211    Lab Results  Component Value Date   HGBA1C 10.0 (H) 07/29/2022    Assessment & Plan:  1. Type 2 diabetes mellitus with hyperglycemia, without long-term current use of insulin Uncontrolled with A1c of 10.0 but blood sugar logs reveal improvement Next A1c is due in 10/2022 hence I will make no regimen change today Renal function labs ordered against next week given he was initiated on Farxiga. Counseled on Diabetic diet, my plate method, 324 minutes of moderate intensity exercise/week Blood sugar logs with fasting goals of 80-120 mg/dl, random of less than 401 and in the event of sugars less than 60 mg/dl or greater than 027 mg/dl encouraged to notify the clinic. Advised on the need for annual eye exams, annual foot  exams, Pneumonia vaccine. - POCT glucose (manual entry) - atorvastatin (LIPITOR) 10 MG tablet; Take 1 tablet (10 mg total) by mouth daily. In the evening  Dispense: 90 tablet; Refill: 1 - glimepiride (AMARYL) 4 MG tablet; Take 1 tablet (4 mg total) by mouth daily before breakfast.  Dispense: 90 tablet; Refill: 1 - Insulin Glargine (BASAGLAR KWIKPEN) 100 UNIT/ML; Inject 20 Units into the skin daily.  Dispense: 30 mL; Refill: 6 - Basic Metabolic Panel; Future  2. Hypertension associated with stage 2 chronic kidney disease due to type 2 diabetes mellitus Uncontrolled due to the fact that he is yet to take his antihypertensive Will reassess blood pressure next visit - amLODipine (NORVASC) 10 MG tablet; Take 1 tablet (10 mg total) by mouth daily.  Dispense: 90 tablet; Refill: 1  3. Screening for colon cancer - Cologuard    Meds ordered this encounter  Medications   amLODipine (NORVASC) 10 MG tablet    Sig: Take 1 tablet (10 mg total) by mouth daily.    Dispense:  90 tablet    Refill:  1   atorvastatin (LIPITOR) 10 MG tablet    Sig: Take 1 tablet (10 mg total) by mouth daily. In the evening    Dispense:  90 tablet    Refill:  1   dapagliflozin propanediol (FARXIGA) 10 MG TABS tablet    Sig: Take 1 tablet (10 mg total) by mouth daily before breakfast.    Dispense:  90 tablet    Refill:  1   glimepiride (AMARYL) 4 MG tablet    Sig: Take 1 tablet (4 mg total) by mouth daily before breakfast.    Dispense:  90 tablet    Refill:  1   Insulin Glargine (BASAGLAR KWIKPEN) 100 UNIT/ML    Sig: Inject 20 Units into the skin daily.    Dispense:  30 mL    Refill:  6    Follow-up: Return in about 3 months (around 12/08/2022) for Chronic medical conditions.       Hoy Register, MD, FAAFP. [facility] [place], [place] [phone]   09/08/2022, 4:00 PM

## 2022-09-08 NOTE — Telephone Encounter (Signed)
I met with the patient when he was in the clinic today and completed Part B of the [employer] [place] application.  He said he has already submitted Part A.  I emailed Part B to [employer] [place] Eligibility

## 2022-09-08 NOTE — Patient Instructions (Signed)
Managing Your Hypertension Hypertension, also called high blood pressure, is when the force of the blood pressing against the walls of the arteries is too strong. Arteries are blood vessels that carry blood from your heart throughout your body. Hypertension forces the heart to work harder to pump blood and may cause the arteries to become narrow or stiff. Understanding blood pressure readings A blood pressure reading includes a higher number over a lower number: The first, or top, number is called the systolic pressure. It is a measure of the pressure in your arteries as your heart beats. The second, or bottom number, is called the diastolic pressure. It is a measure of the pressure in your arteries as the heart relaxes. For most people, a normal blood pressure is below 120/80. Your personal target blood pressure may vary depending on your medical conditions, your age, and other factors. Blood pressure is classified into four stages. Based on your blood pressure reading, your health care provider may use the following stages to determine what type of treatment you need, if any. Systolic pressure and diastolic pressure are measured in a unit called millimeters of mercury (mmHg). Normal Systolic pressure: below 120. Diastolic pressure: below 80. Elevated Systolic pressure: 120-129. Diastolic pressure: below 80. Hypertension stage 1 Systolic pressure: 130-139. Diastolic pressure: 80-89. Hypertension stage 2 Systolic pressure: 140 or above. Diastolic pressure: 90 or above. How can this condition affect me? Managing your hypertension is very important. Over time, hypertension can damage the arteries and decrease blood flow to parts of the body, including the brain, heart, and kidneys. Having untreated or uncontrolled hypertension can lead to: A heart attack. A stroke. A weakened blood vessel (aneurysm). Heart failure. Kidney damage. Eye damage. Memory and concentration problems. Vascular  dementia. What actions can I take to manage this condition? Hypertension can be managed by making lifestyle changes and possibly by taking medicines. Your health care provider will help you make a plan to bring your blood pressure within a normal range. You may be referred for counseling on a healthy diet and physical activity. Nutrition  Eat a diet that is high in fiber and potassium, and low in salt (sodium), added sugar, and fat. An example eating plan is called the DASH diet. DASH stands for Dietary Approaches to Stop Hypertension. To eat this way: Eat plenty of fresh fruits and vegetables. Try to fill one-half of your plate at each meal with fruits and vegetables. Eat whole grains, such as whole-wheat pasta, brown rice, or whole-grain bread. Fill about one-fourth of your plate with whole grains. Eat low-fat dairy products. Avoid fatty cuts of meat, processed or cured meats, and poultry with skin. Fill about one-fourth of your plate with lean proteins such as fish, chicken without skin, beans, eggs, and tofu. Avoid pre-made and processed foods. These tend to be higher in sodium, added sugar, and fat. Reduce your daily sodium intake. Many people with hypertension should eat less than 1,500 mg of sodium a day. Lifestyle  Work with your health care provider to maintain a healthy body weight or to lose weight. Ask what an ideal weight is for you. Get at least 30 minutes of exercise that causes your heart to beat faster (aerobic exercise) most days of the week. Activities may include walking, swimming, or biking. Include exercise to strengthen your muscles (resistance exercise), such as weight lifting, as part of your weekly exercise routine. Try to do these types of exercises for 30 minutes at least 3 days a week. Do   not use any products that contain nicotine or tobacco. These products include cigarettes, chewing tobacco, and vaping devices, such as e-cigarettes. If you need help quitting, ask your  health care provider. Control any long-term (chronic) conditions you have, such as high cholesterol or diabetes. Identify your sources of stress and find ways to manage stress. This may include meditation, deep breathing, or making time for fun activities. Alcohol use Do not drink alcohol if: Your health care provider tells you not to drink. You are pregnant, may be pregnant, or are planning to become pregnant. If you drink alcohol: Limit how much you have to: 0-1 drink a day for women. 0-2 drinks a day for men. Know how much alcohol is in your drink. In the [place] one drink equals one 12 oz bottle of beer (355 mL), one 5 oz glass of wine (148 mL), or one 1 oz glass of hard liquor (44 mL). Medicines Your health care provider may prescribe medicine if lifestyle changes are not enough to get your blood pressure under control and if: Your systolic blood pressure is 130 or higher. Your diastolic blood pressure is 80 or higher. Take medicines only as told by your health care provider. Follow the directions carefully. Blood pressure medicines must be taken as told by your health care provider. The medicine does not work as well when you skip doses. Skipping doses also puts you at risk for problems. Monitoring Before you monitor your blood pressure: Do not smoke, drink caffeinated beverages, or exercise within 30 minutes before taking a measurement. Use the bathroom and empty your bladder (urinate). Sit quietly for at least 5 minutes before taking measurements. Monitor your blood pressure at home as told by your health care provider. To do this: Sit with your back straight and supported. Place your feet flat on the floor. Do not cross your legs. Support your arm on a flat surface, such as a table. Make sure your upper arm is at heart level. Each time you measure, take two or three readings one minute apart and record the results. You may also need to have your blood pressure checked regularly by  your health care provider. General information Talk with your health care provider about your diet, exercise habits, and other lifestyle factors that may be contributing to hypertension. Review all the medicines you take with your health care provider because there may be side effects or interactions. Keep all follow-up visits. Your health care provider can help you create and adjust your plan for managing your high blood pressure. Where to find more information National Heart, Lung, and Blood Institute: www.nhlbi.nih.gov American Heart Association: www.heart.org Contact a health care provider if: You think you are having a reaction to medicines you have taken. You have repeated (recurrent) headaches. You feel dizzy. You have swelling in your ankles. You have trouble with your vision. Get help right away if: You develop a severe headache or confusion. You have unusual weakness or numbness, or you feel faint. You have severe pain in your chest or abdomen. You vomit repeatedly. You have trouble breathing. These symptoms may be an emergency. Get help right away. Call 911. Do not wait to see if the symptoms will go away. Do not drive yourself to the hospital. Summary Hypertension is when the force of blood pumping through your arteries is too strong. If this condition is not controlled, it may put you at risk for serious complications. Your personal target blood pressure may vary depending on your medical conditions,   your age, and other factors. For most people, a normal blood pressure is less than 120/80. Hypertension is managed by lifestyle changes, medicines, or both. Lifestyle changes to help manage hypertension include losing weight, eating a healthy, low-sodium diet, exercising more, stopping smoking, and limiting alcohol. This information is not intended to replace advice given to you by your health care provider. Make sure you discuss any questions you have with your health care  provider. Document Revised: 01/28/2021 Document Reviewed: 01/28/2021 Elsevier Patient Education  2023 Elsevier Inc.  

## 2022-09-08 NOTE — Telephone Encounter (Signed)
Please advise him I would like him to have labs in 1 week to check his kidney function since Lukehart placed him on Farxiga at his visit.  Thank you.

## 2022-09-09 NOTE — Telephone Encounter (Signed)
Pt was called and given lab appointment

## 2022-09-16 ENCOUNTER — Ambulatory Visit: Payer: Medicare Other | Attending: Family Medicine

## 2022-09-16 ENCOUNTER — Other Ambulatory Visit: Payer: Self-pay

## 2022-09-16 DIAGNOSIS — E1165 Type 2 diabetes mellitus with hyperglycemia: Secondary | ICD-10-CM

## 2022-09-17 LAB — BASIC METABOLIC PANEL
BUN/Creatinine Ratio: 15 (ref 10–24)
BUN: 26 mg/dL (ref 8–27)
CO2: 22 mmol/L (ref 20–29)
Calcium: 9.8 mg/dL (ref 8.6–10.2)
Chloride: 105 mmol/L (ref 96–106)
Creatinine, Ser: 1.68 mg/dL — ABNORMAL HIGH (ref 0.76–1.27)
Glucose: 89 mg/dL (ref 70–99)
Potassium: 4.8 mmol/L (ref 3.5–5.2)
Sodium: 142 mmol/L (ref 134–144)
eGFR: 42 mL/min/{1.73_m2} — ABNORMAL LOW (ref >59)

## 2022-09-23 ENCOUNTER — Other Ambulatory Visit: Payer: Self-pay

## 2022-10-12 ENCOUNTER — Other Ambulatory Visit: Payer: Self-pay

## 2022-10-13 ENCOUNTER — Other Ambulatory Visit: Payer: Self-pay

## 2022-11-16 ENCOUNTER — Other Ambulatory Visit: Payer: Self-pay

## 2022-11-29 ENCOUNTER — Other Ambulatory Visit: Payer: Self-pay

## 2022-12-06 ENCOUNTER — Telehealth: Payer: Self-pay

## 2022-12-06 NOTE — Telephone Encounter (Signed)
I spoke to [name] and she confirmed that he has been certified for services

## 2022-12-07 ENCOUNTER — Other Ambulatory Visit: Payer: Self-pay

## 2022-12-13 ENCOUNTER — Other Ambulatory Visit: Payer: Self-pay

## 2022-12-13 ENCOUNTER — Ambulatory Visit: Payer: Medicare Other | Admitting: Family Medicine

## 2022-12-14 ENCOUNTER — Other Ambulatory Visit: Payer: Self-pay

## 2022-12-15 ENCOUNTER — Other Ambulatory Visit: Payer: Self-pay

## 2022-12-19 ENCOUNTER — Other Ambulatory Visit: Payer: Self-pay

## 2023-01-04 ENCOUNTER — Ambulatory Visit: Payer: Medicare Other | Attending: Family Medicine

## 2023-01-04 VITALS — Ht 75.0 in | Wt 243.0 lb

## 2023-01-04 DIAGNOSIS — Z0000 Encounter for general adult medical examination without abnormal findings: Secondary | ICD-10-CM | POA: Diagnosis not present

## 2023-01-04 NOTE — Progress Notes (Signed)
Subjective:   Joseph [name] is a 77 y.o. male who presents for Medicare Annual/Subsequent preventive examination.  Visit Complete: Virtual  I connected with  Joseph [name] on 01/04/23 by a audio enabled telemedicine application and verified that I am speaking with the correct person using two identifiers.  Patient Location: Home  Provider Location: Home Office  I discussed the limitations of evaluation and management by telemedicine. The patient expressed understanding and agreed to proceed.  Vital Signs: Per patient no change in vitals since last visit.  Review of Systems     Cardiac Risk Factors include: advanced age (>77men, >41 women);diabetes mellitus;dyslipidemia;hypertension;male gender     Objective:    Today's Vitals   01/04/23 1430  Weight: 243 lb (110.2 kg)  Height: 6\' 3"  (1.905 m)   Body mass index is 30.37 kg/m.     01/04/2023    2:34 PM 02/04/2021    6:29 PM 01/30/2021    4:30 PM 01/26/2021    8:06 AM 11/03/2020   10:41 AM 10/20/2020    2:30 PM 09/26/2019   11:01 AM  Advanced Directives  Does Patient Have a Medical Advance Directive? No No Yes No No No Yes  Type of Advance Directive  Living will Living will    Healthcare Power of [place];Living will  Does patient want to make changes to medical advance directive?  Yes (Inpatient - patient requests chaplain consult to change a medical advance directive) No - Patient declined      Copy of Healthcare Power of Attorney in Chart?       No - copy requested  Would patient like information on creating a medical advance directive? Yes (MAU/Ambulatory/Procedural Areas - Information given) Yes (Inpatient - patient requests chaplain consult to create a medical advance directive)  No - Guardian declined Yes (MAU/Ambulatory/Procedural Areas - Information given) No - Patient declined     Current Medications (verified) Outpatient Encounter Medications as of 01/04/2023  Medication Sig   Accu-Chek Softclix Lancets lancets Use to  check blood sugars three times per day   amLODipine (NORVASC) 10 MG tablet Take 1 tablet (10 mg total) by mouth daily.   aspirin EC 81 MG EC tablet Take 1 tablet (81 mg total) by mouth daily. Swallow whole.   atorvastatin (LIPITOR) 10 MG tablet Take 1 tablet (10 mg total) by mouth daily. In the evening   Blood Glucose Monitoring Suppl (ACCU-CHEK GUIDE) w/Device KIT Check blood sugar three times daily.   dapagliflozin propanediol (FARXIGA) 10 MG TABS tablet Take 1 tablet (10 mg total) by mouth daily before breakfast.   ferrous sulfate (FEROSUL) 325 (65 FE) MG tablet Take 1 tablet (325 mg total) by mouth daily with breakfast.   glimepiride (AMARYL) 4 MG tablet Take 1 tablet (4 mg total) by mouth daily before breakfast.   glucose blood (ACCU-CHEK GUIDE) test strip Use as instructed   Insulin Glargine (BASAGLAR KWIKPEN) 100 UNIT/ML Inject 20 Units into the skin daily.   Insulin Pen Needle (TECHLITE PEN NEEDLES) 32G X 4 MM MISC Use 1 pen tip to inject insulin once daily.   Misc. Devices MISC leg extension and foot rest for the left side.   Multiple Vitamins-Minerals (ONE-A-DAY MENS 50+ ADVANTAGE) TABS Take 1 tablet by mouth daily with breakfast.   polyethylene glycol (MIRALAX / GLYCOLAX) 17 g packet Take 17 g by mouth daily as needed for mild constipation.   No facility-administered encounter medications on file as of 01/04/2023.    Allergies (verified) Other  History: Past Medical History:  Diagnosis Date   Cellulitis of right lower extremity    Diabetes mellitus type 2 in nonobese [facility])    Gangrene of right foot (HCC)    Hypertension    Open wound of right foot    Past Surgical History:  Procedure Laterality Date   ABDOMINAL AORTOGRAM W/LOWER EXTREMITY Left 01/27/2021   Procedure: ABDOMINAL AORTOGRAM W/LOWER EXTREMITY;  Surgeon: Leonie Douglas, MD;  Location: [facility] INVASIVE CV LAB;  Service: Cardiovascular;  Laterality: Left;   AMPUTATION Right 11/16/2018   Procedure: RIGHT BELOW KNEE  AMPUTATION;  Surgeon: [name], MD;  Location: [facility] OR;  Service: Orthopedics;  Laterality: Right;   AMPUTATION Left 02/03/2021   Procedure: LEFT BELOW KNEE AMPUTATION;  Surgeon: [name], MD;  Location: [facility] OR;  Service: Orthopedics;  Laterality: Left;   APPLICATION OF WOUND VAC  02/03/2021   Procedure: APPLICATION OF WOUND VAC;  Surgeon: [name], MD;  Location: [facility] OR;  Service: Orthopedics;;   HAND SURGERY     Family History  Problem Relation Age of Onset   Diabetes Father    Cancer Neg Hx    CAD Neg Hx    Social History   Socioeconomic History   Marital status: Divorced    Spouse name: Not on file   Number of children: Not on file   Years of education: Not on file   Highest education level: Not on file  Occupational History   Occupation: retired     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  Tobacco Use   Smoking status: Never   Smokeless tobacco: Never  Vaping Use   Vaping status: Never Used  Substance and Sexual Activity   Alcohol use: Not Currently   Drug use: No   Sexual activity: Not on file  Other Topics Concern   Not on file  Social History Narrative   Lives alone, 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   On fixed Social security income   Has a daughter who is a Lawyer and son does not speak with them every day   Most reliable family members reported to live in [place]   Divorced but remains friends with his ex wife, Joseph [name]   Has advance directives but has not as 11/03/20 given a copy to [facility] facility   Goes to church      Social Determinants of Health   Financial Resource Strain: Low Risk  (01/04/2023)   Overall Financial Resource Strain (CARDIA)    Difficulty of Paying Living Expenses: Not hard at all  Food Insecurity: Food Insecurity Present (01/04/2023)   Hunger Vital Sign    Worried About Running Out of Food in the Last Year: Sometimes true    Ran Out of Food in the Last Year: Sometimes true  Transportation Needs: No Transportation Needs  (01/04/2023)   PRAPARE - Administrator, Civil Service (Medical): No    Lack of Transportation (Non-Medical): No  Physical Activity: Insufficiently Active (01/04/2023)   Exercise Vital Sign    Days of Exercise per Week: 3 days    Minutes of Exercise per Session: 30 min  Stress: No Stress Concern Present (01/04/2023)   [employer] of Occupational Health - Occupational Stress Questionnaire    Feeling of Stress : Not at all  Social Connections: Moderately Integrated (01/04/2023)   Social Connection and Isolation Panel [NHANES]    Frequency of Communication with Friends and Family: More than three times a  week    Frequency of Social Gatherings with Friends and Family: Twice a week    Attends Religious Services: More than 4 times per year    Active Member of [employer] or Organizations: Yes    Attends Banker Meetings: 1 to 4 times per year    Marital Status: Divorced    Tobacco Counseling Counseling given: Not Answered   Clinical Intake:  Pre-visit preparation completed: Yes  Pain : No/denies pain     Diabetes: Yes CBG done?: No Did pt. bring in CBG monitor from home?: No  How often do you need to have someone help you when you read instructions, pamphlets, or other written materials from your doctor or pharmacy?: 1 - Never  Interpreter Needed?: No  Information entered by :: Kandis Fantasia LPN   Activities of Daily Living    01/04/2023    2:31 PM  In your present state of health, do you have any difficulty performing the following activities:  Hearing? 0  Vision? 0  Difficulty concentrating or making decisions? 0  Walking or climbing stairs? 1  Dressing or bathing? 0  Doing errands, shopping? 0  Preparing Food and eating ? N  Using the Toilet? N  In the past six months, have you accidently leaked urine? N  Do you have problems with loss of bowel control? N  Managing your Medications? N  Managing your Finances? N  Housekeeping or managing your  Housekeeping? N    Patient Care Team: Hoy Register, MD as PCP - General (Family Medicine) Pricilla Riffle, MD as PCP - Cardiology (Cardiology) Edwin Cap, DPM as Consulting Physician (Podiatry) [name], MD as Consulting Physician (Orthopedic Surgery)  Indicate any recent Medical Services you may have received from other than [facility] providers in the past year (date may be approximate).     Assessment:   This is a routine wellness examination for Joseph.  Hearing/Vision screen Hearing Screening - Comments:: Denies hearing difficulties   Vision Screening - Comments:: Wears rx glasses - up to date with routine eye exams with Dr. Emily Filbert    Dietary issues and exercise activities discussed:     Goals Addressed             This Visit's Progress    Remain active and independent         Depression Screen    01/04/2023    2:33 PM 09/08/2022   11:22 AM 09/02/2022   12:31 PM 03/22/2022   11:39 AM 11/18/2021   10:44 AM 06/03/2021    1:53 PM 05/03/2021    2:34 PM  PHQ 2/9 Scores  PHQ - 2 Score 0 0 0 0 0 0 0  PHQ- 9 Score  0  0       Fall Risk    01/04/2023    2:35 PM 09/08/2022   11:22 AM 03/22/2022   11:40 AM 07/05/2021    2:11 PM 04/01/2021   11:23 AM  Fall Risk   Falls in the past year? 0 0 0 0 0  Number falls in past yr: 0 0 0 0 0  Injury with Fall? 0 0 0 0 0  Risk for fall due to : Impaired mobility;Impaired balance/gait No Fall Risks No Fall Risks    Follow up Falls prevention discussed;Education provided;Falls evaluation completed  Falls evaluation completed Falls evaluation completed     MEDICARE RISK AT HOME:  Medicare Risk at Home - 01/04/23 1435     Any  stairs in or around the home? No    If so, are there any without handrails? No    Home free of loose throw rugs in walkways, pet beds, electrical cords, etc? Yes    Adequate lighting in your home to reduce risk of falls? Yes    Life alert? No    Use of a cane, walker or w/c? Yes    Grab bars in the  bathroom? Yes    Shower chair or bench in shower? Yes    Elevated toilet seat or a handicapped toilet? Yes             TIMED UP AND GO:  Was the test performed?  No    Cognitive Function:        01/04/2023    2:35 PM  6CIT Screen  What Year? 0 points  What month? 0 points  What time? 0 points  Count back from 20 0 points  Months in reverse 0 points  Repeat phrase 0 points  Total Score 0 points    Immunizations Immunization History  Administered Date(s) Administered   Influenza,inj,Quad PF,6+ Mos 02/12/2013   Tdap 03/22/2022    TDAP status: Up to date  Flu Vaccine status: Declined, Education has been provided regarding the importance of this vaccine but patient still declined. Advised may receive this vaccine at local pharmacy or Health Dept. Aware to provide a copy of the vaccination record if obtained from local pharmacy or Health Dept. Verbalized acceptance and understanding.  Pneumococcal vaccine status: Declined,  Education has been provided regarding the importance of this vaccine but patient still declined. Advised may receive this vaccine at local pharmacy or Health Dept. Aware to provide a copy of the vaccination record if obtained from local pharmacy or Health Dept. Verbalized acceptance and understanding.   Covid-19 vaccine status: Information provided on how to obtain vaccines.   Qualifies for Shingles Vaccine? Yes   Zostavax completed No   Shingrix Completed?: No.    Education has been provided regarding the importance of this vaccine. Patient has been advised to call insurance company to determine out of pocket expense if they have not yet received this vaccine. Advised may also receive vaccine at local pharmacy or Health Dept. Verbalized acceptance and understanding.  Screening Tests Health Maintenance  Topic Date Due   Colonoscopy  Never done   Zoster Vaccines- Shingrix (1 of 2) Never done   OPHTHALMOLOGY EXAM  07/08/2022   Pneumonia Vaccine 35+  Years old (1 of 2 - PCV) 09/08/2023 (Originally 11/09/1951)   HEMOGLOBIN A1C  01/29/2023   Diabetic kidney evaluation - Urine ACR  03/23/2023   Diabetic kidney evaluation - eGFR measurement  09/16/2023   Medicare Annual Wellness (AWV)  01/04/2024   DTaP/Tdap/Td (2 - Td or Tdap) 03/22/2032   Hepatitis C Screening  Completed   HPV VACCINES  Aged Out   FOOT EXAM  Discontinued   COVID-19 Vaccine  Discontinued    Health Maintenance  Health Maintenance Due  Topic Date Due   Colonoscopy  Never done   Zoster Vaccines- Shingrix (1 of 2) Never done   OPHTHALMOLOGY EXAM  07/08/2022    Colorectal cancer screening:  Cologuard order  Lung Cancer Screening: (Low Dose CT Chest recommended if Age 8-80 years, 20 pack-year currently smoking OR have quit w/in 15years.) does not qualify.   Lung Cancer Screening Referral: n/a  Additional Screening:  Hepatitis C Screening: does qualify; Completed 03/22/22  Vision Screening: Recommended annual ophthalmology exams for  early detection of glaucoma and other disorders of the eye. Is the patient up to date with their annual eye exam?  Yes  Who is the provider or what is the name of the office in which the patient attends annual eye exams? Dr. Emily Filbert  If pt is not established with a provider, would they like to be referred to a provider to establish care? No .   Dental Screening: Recommended annual dental exams for proper oral hygiene  Diabetic Foot Exam: Discontinued  Community Resource Referral / Chronic Care Management: CRR required this visit?  No   CCM required this visit?  No     Plan:     I have personally reviewed and noted the following in the patient's chart:   Medical and social history Use of alcohol, tobacco or illicit drugs  Current medications and supplements including opioid prescriptions. Patient is not currently taking opioid prescriptions. Functional ability and status Nutritional status Physical activity Advanced  directives List of other physicians Hospitalizations, surgeries, and ER visits in previous 12 months Vitals Screenings to include cognitive, depression, and falls Referrals and appointments  In addition, I have reviewed and discussed with patient certain preventive protocols, quality metrics, and best practice recommendations. A written personalized care plan for preventive services as well as general preventive health recommendations were provided to patient.     Kandis Fantasia [place], [place]   3/0/8657   After Visit Summary: (Mail) Due to this being a telephonic visit, the after visit summary with patients personalized plan was offered to patient via mail   Nurse Notes: No concerns at this time

## 2023-01-04 NOTE — Patient Instructions (Signed)
Mr. Joseph [name] , Thank you for taking time to come for your Medicare Wellness Visit. I appreciate your ongoing commitment to your health goals. Please review the following plan we discussed and let me know if I can assist you in the future.   Referrals/Orders/Follow-Ups/Clinician Recommendations: Aim for 30 minutes of exercise or brisk walking, 6-8 glasses of water, and 5 servings of fruits and vegetables each day.  This is a list of the screening recommended for you and due dates:  Health Maintenance  Topic Date Due   Colon Cancer Screening  Never done   Zoster (Shingles) Vaccine (1 of 2) Never done   Eye exam for diabetics  07/08/2022   Pneumonia Vaccine (1 of 2 - PCV) 09/08/2023*   Hemoglobin A1C  01/29/2023   Yearly kidney health urinalysis for diabetes  03/23/2023   Yearly kidney function blood test for diabetes  09/16/2023   Medicare Annual Wellness Visit  01/04/2024   DTaP/Tdap/Td vaccine (2 - Td or Tdap) 03/22/2032   Hepatitis C Screening  Completed   HPV Vaccine  Aged Out   Complete foot exam   Discontinued   COVID-19 Vaccine  Discontinued  *Topic was postponed. The date shown is not the original due date.    Advanced directives: (ACP Link)Information on Advanced Care Planning can be found at [facility] Advance Health Care Directives Advance Health Care Directives ([email or web address]   Next Medicare Annual Wellness Visit scheduled for next year: Yes  Preventive Care 65 Years and Older, Male  Preventive care refers to lifestyle choices and visits with your health care provider that can promote health and wellness. What does preventive care include? A yearly physical exam. This is also called an annual well check. Dental exams once or twice a year. Routine eye exams. Ask your health care provider how often you should have your eyes checked. Personal lifestyle choices, including: Daily care of your teeth and gums. Regular physical activity. Eating a healthy  diet. Avoiding tobacco and drug use. Limiting alcohol use. Practicing safe sex. Taking low doses of aspirin every day. Taking vitamin and mineral supplements as recommended by your health care provider. What happens during an annual well check? The services and screenings done by your health care provider during your annual well check will depend on your age, overall health, lifestyle risk factors, and family history of disease. Counseling  Your health care provider may ask you questions about your: Alcohol use. Tobacco use. Drug use. Emotional well-being. Home and relationship well-being. Sexual activity. Eating habits. History of falls. Memory and ability to understand (cognition). Work and work Astronomer. Screening  You may have the following tests or measurements: Height, weight, and BMI. Blood pressure. Lipid and cholesterol levels. These may be checked every 5 years, or more frequently if you are over 82 years old. Skin check. Lung cancer screening. You may have this screening every year starting at age 2 if you have a 30-pack-year history of smoking and currently smoke or have quit within the past 15 years. Fecal occult blood test (FOBT) of the stool. You may have this test every year starting at age 28. Flexible sigmoidoscopy or colonoscopy. You may have a sigmoidoscopy every 5 years or a colonoscopy every 10 years starting at age 51. Prostate cancer screening. Recommendations will vary depending on your family history and other risks. Hepatitis C blood test. Hepatitis B blood test. Sexually transmitted disease (STD) testing. Diabetes screening. This is done by checking your blood sugar (glucose) after  you have not eaten for a while (fasting). You may have this done every 1-3 years. Abdominal aortic aneurysm (AAA) screening. You may need this if you are a current or former smoker. Osteoporosis. You may be screened starting at age 60 if you are at high risk. Talk with  your health care provider about your test results, treatment options, and if necessary, the need for more tests. Vaccines  Your health care provider may recommend certain vaccines, such as: Influenza vaccine. This is recommended every year. Tetanus, diphtheria, and acellular pertussis (Tdap, Td) vaccine. You may need a Td booster every 10 years. Zoster vaccine. You may need this after age 27. Pneumococcal 13-valent conjugate (PCV13) vaccine. One dose is recommended after age 1. Pneumococcal polysaccharide (PPSV23) vaccine. One dose is recommended after age 23. Talk to your health care provider about which screenings and vaccines you need and how often you need them. This information is not intended to replace advice given to you by your health care provider. Make sure you discuss any questions you have with your health care provider. Document Released: 06/12/2015 Document Revised: 02/03/2016 Document Reviewed: 03/17/2015 Elsevier Interactive Patient Education  2017 [employer].  Fall Prevention in the Home Falls can cause injuries. They can happen to people of all ages. There are many things you can do to make your home safe and to help prevent falls. What can I do on the outside of my home? Regularly fix the edges of walkways and driveways and fix any cracks. Remove anything that might make you trip as you walk through a door, such as a raised step or threshold. Trim any bushes or trees on the path to your home. Use bright outdoor lighting. Clear any walking paths of anything that might make someone trip, such as rocks or tools. Regularly check to see if handrails are loose or broken. Make sure that both sides of any steps have handrails. Any raised decks and porches should have guardrails on the edges. Have any leaves, snow, or ice cleared regularly. Use sand or salt on walking paths during winter. Clean up any spills in your garage right away. This includes oil or grease spills. What  can I do in the bathroom? Use night lights. Install grab bars by the toilet and in the tub and shower. Do not use towel bars as grab bars. Use non-skid mats or decals in the tub or shower. If you need to sit down in the shower, use a plastic, non-slip stool. Keep the floor dry. Clean up any water that spills on the floor as soon as it happens. Remove soap buildup in the tub or shower regularly. Attach bath mats securely with double-sided non-slip rug tape. Do not have throw rugs and other things on the floor that can make you trip. What can I do in the bedroom? Use night lights. Make sure that you have a light by your bed that is easy to reach. Do not use any sheets or blankets that are too big for your bed. They should not hang down onto the floor. Have a firm chair that has side arms. You can use this for support while you get dressed. Do not have throw rugs and other things on the floor that can make you trip. What can I do in the kitchen? Clean up any spills right away. Avoid walking on wet floors. Keep items that you use a lot in easy-to-reach places. If you need to reach something above you, use a strong  step stool that has a grab bar. Keep electrical cords out of the way. Do not use floor polish or wax that makes floors slippery. If you must use wax, use non-skid floor wax. Do not have throw rugs and other things on the floor that can make you trip. What can I do with my stairs? Do not leave any items on the stairs. Make sure that there are handrails on both sides of the stairs and use them. Fix handrails that are broken or loose. Make sure that handrails are as long as the stairways. Check any carpeting to make sure that it is firmly attached to the stairs. Fix any carpet that is loose or worn. Avoid having throw rugs at the top or bottom of the stairs. If you do have throw rugs, attach them to the floor with carpet tape. Make sure that you have a light switch at the top of the  stairs and the bottom of the stairs. If you do not have them, ask someone to add them for you. What else can I do to help prevent falls? Wear shoes that: Do not have high heels. Have rubber bottoms. Are comfortable and fit you well. Are closed at the toe. Do not wear sandals. If you use a stepladder: Make sure that it is fully opened. Do not climb a closed stepladder. Make sure that both sides of the stepladder are locked into place. Ask someone to hold it for you, if possible. Clearly mark and make sure that you can see: Any grab bars or handrails. First and last steps. Where the edge of each step is. Use tools that help you move around (mobility aids) if they are needed. These include: Canes. Walkers. Scooters. Crutches. Turn on the lights when you go into a dark area. Replace any light bulbs as soon as they burn out. Set up your furniture so you have a clear path. Avoid moving your furniture around. If any of your floors are uneven, fix them. If there are any pets around you, be aware of where they are. Review your medicines with your doctor. Some medicines can make you feel dizzy. This can increase your chance of falling. Ask your doctor what other things that you can do to help prevent falls. This information is not intended to replace advice given to you by your health care provider. Make sure you discuss any questions you have with your health care provider. Document Released: 03/12/2009 Document Revised: 10/22/2015 Document Reviewed: 06/20/2014 Elsevier Interactive Patient Education  2017 [employer].

## 2023-01-12 ENCOUNTER — Telehealth: Payer: Self-pay | Admitting: Family Medicine

## 2023-01-12 ENCOUNTER — Other Ambulatory Visit: Payer: Self-pay | Admitting: Family Medicine

## 2023-01-12 ENCOUNTER — Other Ambulatory Visit: Payer: Self-pay

## 2023-01-12 DIAGNOSIS — E1165 Type 2 diabetes mellitus with hyperglycemia: Secondary | ICD-10-CM

## 2023-01-12 MED ORDER — TECHLITE PLUS PEN NEEDLES 32G X 4 MM MISC
1.0000 | Freq: Every day | 0 refills | Status: DC
Start: 2023-01-12 — End: 2023-05-22
  Filled 2023-01-12: qty 100, 100d supply, fill #0

## 2023-01-12 NOTE — Telephone Encounter (Signed)
Copied from CRM [id number]. Topic: General - Other >> Jan 12, 2023 12:20 PM Phill Myron wrote: Mr Jozwiak called and is asking for you to send Sue Lush (Rehabilitation counselor 1   for the Independent living program   address: [address] [place], [place] [address]  Tele# [phone]  fax# [phone])[place] my last visit,  after visit summary.   Thank you

## 2023-01-12 NOTE — Telephone Encounter (Signed)
Pt called and informed that he will need to sign a medical release in order for me to release his records, pt also informed that he can come pick up a copy and take it to the facility himself. Pt states that he will come pick up his records and take them.

## 2023-01-13 ENCOUNTER — Other Ambulatory Visit: Payer: Self-pay

## 2023-01-18 ENCOUNTER — Other Ambulatory Visit: Payer: Self-pay

## 2023-02-15 ENCOUNTER — Other Ambulatory Visit: Payer: Self-pay

## 2023-02-22 ENCOUNTER — Other Ambulatory Visit: Payer: Self-pay

## 2023-02-22 ENCOUNTER — Other Ambulatory Visit (HOSPITAL_COMMUNITY): Payer: Self-pay

## 2023-03-03 ENCOUNTER — Other Ambulatory Visit: Payer: Self-pay | Admitting: Family Medicine

## 2023-03-03 DIAGNOSIS — Z1211 Encounter for screening for malignant neoplasm of colon: Secondary | ICD-10-CM

## 2023-03-03 DIAGNOSIS — Z1212 Encounter for screening for malignant neoplasm of rectum: Secondary | ICD-10-CM

## 2023-03-06 ENCOUNTER — Telehealth: Payer: Self-pay | Admitting: *Deleted

## 2023-03-06 NOTE — Telephone Encounter (Signed)
Pt contacted to notify him that Cologuard ordered for him by Dr.Newlin. Pt noted he had Cologuard ordered in the past and could not remember why he had not completed it. Pt asked about accuracy of Cologuard, & caller offered the 2024 [employer] 94% specificity (r/t false positive and false negative findings.) Pt instructed that colonoscopy is the "gold standard" and pt asked about  actual procedure, anesthesia effects, need for being accompanied not only for transportation but also during recovery as well as pre-procedure specifics that GI office dr or nurse would cover with him. Pt & caller discussed options of doing Cologuard, already ordered for him by Dr. Alvis Lemmings, &, if positive, f/u with colonoscopy. Pt & caller also discussed his 04/11/23 appt with Dr. Alvis Lemmings as good time to discuss choices and best options for him, caller encouraging pt to consider completing Cologuard in the mean time, noting specificity and the need to test for CRC period, with results, if negative, allowing him to wait 3 years, before needing to be repeated. Pt stated he appreciated the call, would look for the Cologuard box in the mail, and would continue discussion about testing with Dr. Alvis Lemmings at his November appt. Letter also sent to confirm pt agreed to test, and was considering f/u with Dr. Alvis Lemmings

## 2023-03-16 ENCOUNTER — Other Ambulatory Visit: Payer: Self-pay

## 2023-03-16 ENCOUNTER — Other Ambulatory Visit: Payer: Self-pay | Admitting: Family Medicine

## 2023-03-16 DIAGNOSIS — E1122 Type 2 diabetes mellitus with diabetic chronic kidney disease: Secondary | ICD-10-CM

## 2023-03-16 DIAGNOSIS — E1165 Type 2 diabetes mellitus with hyperglycemia: Secondary | ICD-10-CM

## 2023-03-16 MED ORDER — AMLODIPINE BESYLATE 10 MG PO TABS
10.0000 mg | ORAL_TABLET | Freq: Every day | ORAL | 0 refills | Status: DC
Start: 2023-03-16 — End: 2023-04-20
  Filled 2023-03-16: qty 30, 30d supply, fill #0

## 2023-03-16 MED ORDER — GLIMEPIRIDE 4 MG PO TABS
4.0000 mg | ORAL_TABLET | Freq: Every day | ORAL | 0 refills | Status: DC
Start: 2023-03-16 — End: 2023-04-20
  Filled 2023-03-16: qty 30, 30d supply, fill #0

## 2023-03-24 ENCOUNTER — Other Ambulatory Visit: Payer: Self-pay

## 2023-04-11 ENCOUNTER — Ambulatory Visit: Payer: Medicare Other | Admitting: Family Medicine

## 2023-04-20 ENCOUNTER — Other Ambulatory Visit: Payer: Self-pay

## 2023-04-20 ENCOUNTER — Other Ambulatory Visit: Payer: Self-pay | Admitting: Nurse Practitioner

## 2023-04-20 ENCOUNTER — Other Ambulatory Visit: Payer: Self-pay | Admitting: Family Medicine

## 2023-04-20 DIAGNOSIS — E1122 Type 2 diabetes mellitus with diabetic chronic kidney disease: Secondary | ICD-10-CM

## 2023-04-20 DIAGNOSIS — D649 Anemia, unspecified: Secondary | ICD-10-CM

## 2023-04-20 DIAGNOSIS — E1165 Type 2 diabetes mellitus with hyperglycemia: Secondary | ICD-10-CM

## 2023-04-20 MED ORDER — GLIMEPIRIDE 4 MG PO TABS
4.0000 mg | ORAL_TABLET | Freq: Every day | ORAL | 0 refills | Status: DC
Start: 2023-04-20 — End: 2023-07-21
  Filled 2023-04-20: qty 90, 90d supply, fill #0

## 2023-04-20 MED ORDER — GLUCOSE BLOOD VI STRP
ORAL_STRIP | 0 refills | Status: DC
  Filled 2023-04-20: qty 100, 25d supply, fill #0

## 2023-04-20 MED ORDER — FERROUS SULFATE 325 (65 FE) MG PO TABS
325.0000 mg | ORAL_TABLET | Freq: Every day | ORAL | 0 refills | Status: DC
Start: 2023-04-20 — End: 2023-07-21
  Filled 2023-04-20: qty 90, 90d supply, fill #0

## 2023-04-20 MED ORDER — AMLODIPINE BESYLATE 10 MG PO TABS
10.0000 mg | ORAL_TABLET | Freq: Every day | ORAL | 0 refills | Status: DC
Start: 2023-04-20 — End: 2023-05-22
  Filled 2023-04-20: qty 90, 90d supply, fill #0

## 2023-04-21 ENCOUNTER — Other Ambulatory Visit: Payer: Self-pay

## 2023-04-24 ENCOUNTER — Other Ambulatory Visit: Payer: Self-pay

## 2023-05-19 ENCOUNTER — Other Ambulatory Visit: Payer: Self-pay

## 2023-05-22 ENCOUNTER — Other Ambulatory Visit: Payer: Self-pay | Admitting: Family Medicine

## 2023-05-22 ENCOUNTER — Other Ambulatory Visit: Payer: Self-pay

## 2023-05-22 DIAGNOSIS — E1165 Type 2 diabetes mellitus with hyperglycemia: Secondary | ICD-10-CM

## 2023-05-22 DIAGNOSIS — E1122 Type 2 diabetes mellitus with diabetic chronic kidney disease: Secondary | ICD-10-CM

## 2023-05-22 MED ORDER — TECHLITE PLUS PEN NEEDLES 32G X 4 MM MISC
1.0000 | Freq: Every day | 0 refills | Status: DC
  Filled 2023-05-22: qty 100, 100d supply, fill #0

## 2023-05-22 MED ORDER — AMLODIPINE BESYLATE 10 MG PO TABS
10.0000 mg | ORAL_TABLET | Freq: Every day | ORAL | 0 refills | Status: DC
  Filled 2023-05-22: qty 60, 60d supply, fill #0

## 2023-06-01 ENCOUNTER — Other Ambulatory Visit: Payer: Self-pay

## 2023-06-20 ENCOUNTER — Other Ambulatory Visit: Payer: Self-pay

## 2023-06-20 ENCOUNTER — Other Ambulatory Visit: Payer: Self-pay | Admitting: Family Medicine

## 2023-06-20 DIAGNOSIS — E1165 Type 2 diabetes mellitus with hyperglycemia: Secondary | ICD-10-CM

## 2023-06-20 MED ORDER — ATORVASTATIN CALCIUM 10 MG PO TABS
10.0000 mg | ORAL_TABLET | Freq: Every day | ORAL | 0 refills | Status: DC
Start: 2023-06-20 — End: 2023-07-21
  Filled 2023-06-20: qty 90, 90d supply, fill #0

## 2023-06-20 NOTE — Telephone Encounter (Signed)
Requested medications are due for refill today.  yes  Requested medications are on the active medications list.  yes  Last refill. 09/08/2022 #90 1 rf  Future visit scheduled.   yes  Notes to clinic.  Labs are expired.    Requested Prescriptions  Pending Prescriptions Disp Refills   atorvastatin (LIPITOR) 10 MG tablet 90 tablet 1    Sig: Take 1 tablet (10 mg total) by mouth daily. In the evening     Cardiovascular:  Antilipid - Statins Failed - 06/20/2023  1:59 PM      Failed - Lipid Panel in normal range within the last 12 months    Cholesterol, Total  Date Value Ref Range Status  03/22/2022 168 100 - 199 mg/dL Final   LDL Chol Calc (NIH)  Date Value Ref Range Status  03/22/2022 90 0 - 99 mg/dL Final   HDL  Date Value Ref Range Status  03/22/2022 57 >39 mg/dL Final   Triglycerides  Date Value Ref Range Status  03/22/2022 116 0 - 149 mg/dL Final         Passed - Patient is not pregnant      Passed - Valid encounter within last 12 months    Recent Outpatient Visits           9 months ago Type 2 diabetes mellitus with hyperglycemia, without long-term current use of insulin (HCC)   [facility] [place]. [facility] Hoy Register, MD   9 months ago Type 2 diabetes mellitus with hyperglycemia, without long-term current use of insulin (HCC)   [facility] Of [place]. [facility] Lois Huxley, [place] L, RPH-CPP   10 months ago Type 2 diabetes mellitus with hyperglycemia, without long-term current use of insulin (HCC)   [facility] Of [place]. [facility] Lois Huxley, [place] L, RPH-CPP   1 year ago Type 2 diabetes mellitus with hyperglycemia, without long-term current use of insulin (HCC)   [facility] Dept Of [place]. [facility] [place], [place] W, NP   2 years ago Type 2 diabetes mellitus with hyperglycemia, without  long-term current use of insulin (HCC)   [facility] - [facility] Dept Of [place]. [facility] Hoy Register, MD       Future Appointments             In 2 weeks Hoy Register, MD [facility] - A Dept Of [place] [facility]. [facility]

## 2023-06-26 ENCOUNTER — Other Ambulatory Visit: Payer: Self-pay | Admitting: Family Medicine

## 2023-06-26 NOTE — Telephone Encounter (Signed)
Copied from CRM [id number]. Topic: Clinical - Medication Refill >> Jun 26, 2023 11:17 AM Shelah Lewandowsky wrote: Most Recent Primary Care Visit:  Provider: Anthoney Harada  Department: [facility]  Visit Type: MEDICARE AWV, SEQUENTIAL  Date: 01/04/2023  Medication: dapagliflozin propanediol (FARXIGA) 10 MG TABS tablet   Has the patient contacted their pharmacy? No (Agent: If no, request that the patient contact the pharmacy for the refill. If patient does not wish to contact the pharmacy document the reason why and proceed with request.) (Agent: If yes, when and what did the pharmacy advise?)  Is this the correct pharmacy for this prescription? Yes If no, delete pharmacy and type the correct one.  This is the patient's preferred pharmacy:  [facility] [address], [address] [place] [address] Phone: [phone] Fax: [phone]   Has the prescription been filled recently? No  Is the patient out of the medication? No  Has the patient been seen for an appointment in the last year OR does the patient have an upcoming appointment? Yes  Can we respond through MyChart? No  Agent: Please be advised that Rx refills may take up to 3 business days. We ask that you follow-up with your pharmacy.

## 2023-06-27 ENCOUNTER — Other Ambulatory Visit: Payer: Self-pay

## 2023-06-27 MED ORDER — DAPAGLIFLOZIN PROPANEDIOL 10 MG PO TABS
10.0000 mg | ORAL_TABLET | Freq: Every day | ORAL | 0 refills | Status: DC
  Filled 2023-06-27: qty 90, 90d supply, fill #0

## 2023-07-04 ENCOUNTER — Ambulatory Visit: Payer: Medicare Other | Attending: Family Medicine | Admitting: Family Medicine

## 2023-07-17 ENCOUNTER — Other Ambulatory Visit: Payer: Self-pay | Admitting: Family Medicine

## 2023-07-17 ENCOUNTER — Other Ambulatory Visit: Payer: Self-pay

## 2023-07-17 DIAGNOSIS — E1165 Type 2 diabetes mellitus with hyperglycemia: Secondary | ICD-10-CM

## 2023-07-19 ENCOUNTER — Other Ambulatory Visit: Payer: Self-pay

## 2023-07-19 ENCOUNTER — Ambulatory Visit: Payer: Medicare Other | Attending: Physician Assistant | Admitting: Physician Assistant

## 2023-07-19 ENCOUNTER — Encounter: Payer: Self-pay | Admitting: Pharmacist

## 2023-07-21 ENCOUNTER — Other Ambulatory Visit: Payer: Self-pay

## 2023-07-21 ENCOUNTER — Encounter: Payer: Self-pay | Admitting: Internal Medicine

## 2023-07-21 ENCOUNTER — Ambulatory Visit: Payer: Medicare Other | Attending: Internal Medicine | Admitting: Internal Medicine

## 2023-07-21 VITALS — BP 132/72 | HR 70 | Temp 97.6°F | Ht 75.0 in | Wt 236.0 lb

## 2023-07-21 DIAGNOSIS — I130 Hypertensive heart and chronic kidney disease with heart failure and stage 1 through stage 4 chronic kidney disease, or unspecified chronic kidney disease: Secondary | ICD-10-CM | POA: Insufficient documentation

## 2023-07-21 DIAGNOSIS — Z7984 Long term (current) use of oral hypoglycemic drugs: Secondary | ICD-10-CM | POA: Insufficient documentation

## 2023-07-21 DIAGNOSIS — D649 Anemia, unspecified: Secondary | ICD-10-CM | POA: Diagnosis not present

## 2023-07-21 DIAGNOSIS — Z89512 Acquired absence of left leg below knee: Secondary | ICD-10-CM | POA: Diagnosis not present

## 2023-07-21 DIAGNOSIS — I10 Essential (primary) hypertension: Secondary | ICD-10-CM | POA: Diagnosis not present

## 2023-07-21 DIAGNOSIS — E119 Type 2 diabetes mellitus without complications: Secondary | ICD-10-CM | POA: Diagnosis present

## 2023-07-21 DIAGNOSIS — E785 Hyperlipidemia, unspecified: Secondary | ICD-10-CM | POA: Diagnosis not present

## 2023-07-21 DIAGNOSIS — I5032 Chronic diastolic (congestive) heart failure: Secondary | ICD-10-CM | POA: Insufficient documentation

## 2023-07-21 DIAGNOSIS — Z89511 Acquired absence of right leg below knee: Secondary | ICD-10-CM | POA: Diagnosis not present

## 2023-07-21 DIAGNOSIS — E1151 Type 2 diabetes mellitus with diabetic peripheral angiopathy without gangrene: Secondary | ICD-10-CM | POA: Insufficient documentation

## 2023-07-21 DIAGNOSIS — I480 Paroxysmal atrial fibrillation: Secondary | ICD-10-CM | POA: Insufficient documentation

## 2023-07-21 DIAGNOSIS — I152 Hypertension secondary to endocrine disorders: Secondary | ICD-10-CM | POA: Insufficient documentation

## 2023-07-21 DIAGNOSIS — D631 Anemia in chronic kidney disease: Secondary | ICD-10-CM | POA: Insufficient documentation

## 2023-07-21 DIAGNOSIS — E1140 Type 2 diabetes mellitus with diabetic neuropathy, unspecified: Secondary | ICD-10-CM | POA: Insufficient documentation

## 2023-07-21 DIAGNOSIS — K4090 Unilateral inguinal hernia, without obstruction or gangrene, not specified as recurrent: Secondary | ICD-10-CM

## 2023-07-21 DIAGNOSIS — Z794 Long term (current) use of insulin: Secondary | ICD-10-CM

## 2023-07-21 DIAGNOSIS — N1831 Chronic kidney disease, stage 3a: Secondary | ICD-10-CM | POA: Diagnosis not present

## 2023-07-21 DIAGNOSIS — E1169 Type 2 diabetes mellitus with other specified complication: Secondary | ICD-10-CM | POA: Diagnosis not present

## 2023-07-21 DIAGNOSIS — E1159 Type 2 diabetes mellitus with other circulatory complications: Secondary | ICD-10-CM

## 2023-07-21 DIAGNOSIS — E1122 Type 2 diabetes mellitus with diabetic chronic kidney disease: Secondary | ICD-10-CM | POA: Insufficient documentation

## 2023-07-21 LAB — POCT GLYCOSYLATED HEMOGLOBIN (HGB A1C): HbA1c, POC (controlled diabetic range): 8 % — AB (ref 0.0–7.0)

## 2023-07-21 LAB — GLUCOSE, POCT (MANUAL RESULT ENTRY): POC Glucose: 127 mg/dL — AB (ref 70–99)

## 2023-07-21 MED ORDER — BASAGLAR KWIKPEN 100 UNIT/ML ~~LOC~~ SOPN
20.0000 [IU] | PEN_INJECTOR | Freq: Every day | SUBCUTANEOUS | 6 refills | Status: DC
  Filled 2023-07-21: qty 6, 30d supply, fill #0
  Filled 2023-08-16: qty 6, 30d supply, fill #1
  Filled 2023-09-14: qty 6, 30d supply, fill #2
  Filled 2023-10-18 (×2): qty 6, 30d supply, fill #3

## 2023-07-21 MED ORDER — AMLODIPINE BESYLATE 10 MG PO TABS
10.0000 mg | ORAL_TABLET | Freq: Every day | ORAL | 1 refills | Status: DC
  Filled 2023-07-21: qty 90, 90d supply, fill #0
  Filled 2023-10-18 (×2): qty 90, 90d supply, fill #1

## 2023-07-21 MED ORDER — TECHLITE PLUS PEN NEEDLES 32G X 4 MM MISC
1.0000 | Freq: Every day | 0 refills | Status: DC
  Filled 2023-07-21 – 2023-08-16 (×2): qty 100, 100d supply, fill #0

## 2023-07-21 MED ORDER — ATORVASTATIN CALCIUM 10 MG PO TABS
10.0000 mg | ORAL_TABLET | Freq: Every day | ORAL | 0 refills | Status: DC
  Filled 2023-07-21 – 2023-09-14 (×2): qty 90, 90d supply, fill #0

## 2023-07-21 MED ORDER — DAPAGLIFLOZIN PROPANEDIOL 10 MG PO TABS
10.0000 mg | ORAL_TABLET | Freq: Every day | ORAL | 0 refills | Status: DC
  Filled 2023-07-21 – 2023-10-18 (×2): qty 90, 90d supply, fill #0
  Filled 2023-10-18: qty 30, 30d supply, fill #0

## 2023-07-21 MED ORDER — GLIMEPIRIDE 4 MG PO TABS
ORAL_TABLET | ORAL | 0 refills | Status: DC
  Filled 2023-07-21: qty 135, 90d supply, fill #0

## 2023-07-21 MED ORDER — FERROUS SULFATE 325 (65 FE) MG PO TABS
325.0000 mg | ORAL_TABLET | Freq: Every day | ORAL | 0 refills | Status: DC
Start: 2023-07-21 — End: 2023-10-18
  Filled 2023-07-21: qty 90, 90d supply, fill #0

## 2023-07-21 NOTE — Progress Notes (Signed)
Patient ID: Joseph [name], male    DOB: 1945/11/16  MRN: [id number]  CC: Diabetes (Dm & HTN f/u. Med refill. )   Subjective: Joseph [name] is a 78 y.o. male who presents for chronic ds management. PCP is Dr. Alvis Lemmings.  Last saw her 08/2022. His concerns today include:  Patient with history of HTN, CHFpEF, DM with peripheral neuropathy, PAD, PAF, HL, CKD2, BL BKA  Discussed the use of AI scribe software for clinical note transcription with the patient, who gave verbal consent to proceed.  History of Present Illness   The patient, with a history of diabetes, hypertension, hyperlipidemia, and kidney disease, presents for a routine follow-up.   DM: Results for orders placed or performed in visit on 07/21/23  POCT glucose (manual entry)   Collection Time: 07/21/23  4:16 PM  Result Value Ref Range   POC Glucose 127 (A) 70 - 99 mg/dl  POCT glycosylated hemoglobin (Hb A1C)   Collection Time: 07/21/23  4:25 PM  Result Value Ref Range   Hemoglobin A1C     HbA1c POC (<> result, manual entry)     HbA1c, POC (prediabetic range)     HbA1c, POC (controlled diabetic range) 8.0 (A) 0.0 - 7.0 %  Reports comfort (taking Farxiga 10 mg daily, Basaglar insulin 20 units daily and glimepiride 4 mg daily. He also reports checking his blood sugars one to two times a day, typically before breakfast and sometimes in the evening. Morning readings are generally around 130, but can drop to 110-115 if he avoids late-night snacking. He denies episodes of hypoglycemia.  The patient also reports a bulge in his right groin, which he suspects may be a hernia. The bulge has been present for a few years and has recently increased in size. It is not painful and tends to be smaller in the morning, increasing in size as he moves around during the day.  HTN: taking his amlodipine 10 mg daily and checking his blood pressure at home, which typically runs around 130-140 for systolic. He has started to limit his salt intake. He also  reports taking his atorvastatin for hyperlipidemia.  CKD: GFR previously in the 50s but on last check it was 46.  He denies taking any ibuprofen or Aleve. He has not experienced any shortness of breath, chest pains, or feelings of heart racing.       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)    Leukocytosis    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HCC) 11/20/2018   Paroxysmal atrial fibrillation (HCC)    Diabetic polyneuropathy associated with type 2 diabetes mellitus (HCC)    PVD (peripheral vascular disease) (HCC)    Critical lower limb ischemia (HCC)    Sepsis with acute renal failure (HCC) 11/13/2018   Acute renal failure superimposed on stage 2 chronic kidney disease (HCC)    'Light-for-dates' infant  with signs of fetal malnutrition 10/17/2018   DM (diabetes mellitus) (HCC) 07/02/2012   Hypertension associated with diabetes (HCC) 07/02/2012     Current Outpatient Medications on File Prior to Visit  Medication Sig Dispense Refill   Accu-Chek Softclix Lancets lancets Use to check blood sugars three times per day 100 each 11   aspirin EC 81 MG EC tablet Take 1 tablet (81 mg total) by mouth daily. Swallow whole. 30 tablet 11   Blood Glucose Monitoring Suppl (ACCU-CHEK GUIDE) w/Device KIT Check blood sugar three times daily. 1 kit 0   glucose blood test strip Use as instructed 100 each 0   Misc. Devices MISC leg extension  and foot rest for the left side. 1 each 0   Multiple Vitamins-Minerals (ONE-A-DAY MENS 50+ ADVANTAGE) TABS Take 1 tablet by mouth daily with breakfast.     polyethylene glycol (MIRALAX / GLYCOLAX) 17 g packet Take 17 g by mouth daily as needed for mild constipation. 14 each 0   No current facility-administered medications on file prior to visit.    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  Tobacco Use   Smoking status: Never   Smokeless tobacco: Never  Vaping Use   Vaping status: Never Used  Substance and Sexual Activity   Alcohol use: Not Currently   Drug use: No   Sexual activity: Not on file  Other Topics Concern   Not on file  Social History Narrative   Lives alone, 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   On fixed Social security income   Has a daughter who is a Lawyer and son does not speak with them every day   Most reliable family members reported to live in [place]   Divorced but remains friends with his ex wife, [name]   Has advance directives but has not as 11/03/20 given a copy to [facility]   Goes to church      Social Drivers of Health   Financial Resource Strain: Low Risk  (07/21/2023)   Overall Financial Resource Strain (CARDIA)    Difficulty of Paying Living Expenses: Not very hard  Food Insecurity: No Food Insecurity (07/21/2023)   Hunger Vital Sign    Worried About Running Out of Food in the Last Year: Never true     Ran Out of Food in the Last Year: Never true  Transportation Needs: No Transportation Needs (07/21/2023)   PRAPARE - Administrator, Civil Service (Medical): No    Lack of Transportation (Non-Medical): No  Physical Activity: Sufficiently Active (07/21/2023)   Exercise Vital Sign    Days of Exercise per Week: 5 days    Minutes of Exercise per Session: 40 min  Stress: No Stress Concern Present (07/21/2023)   [employer] of Occupational Health - Occupational Stress Questionnaire    Feeling of Stress : Not at all  Social Connections: Moderately Isolated (07/21/2023)   Social Connection and Isolation Panel [NHANES]    Frequency of Communication with Friends and Family: Once a week    Frequency of Social Gatherings with Friends and Family: Twice a week    Attends Religious Services: More than 4 times per year    Active Member of [employer] or Organizations: No    Attends Banker Meetings: Never    Marital Status: Widowed  Intimate Partner Violence: Not At Risk (07/21/2023)   Humiliation, Afraid, Rape, and Kick questionnaire    Fear of Current or Ex-Partner: No    Emotionally Abused: No    Physically Abused: No    Sexually Abused: No    Family History  Problem Relation Age of Onset   Diabetes Father    Cancer Neg Hx    CAD Neg Hx     Past Surgical History:  Procedure Laterality Date   ABDOMINAL AORTOGRAM W/LOWER EXTREMITY Left 01/27/2021   Procedure: ABDOMINAL AORTOGRAM W/LOWER EXTREMITY;  Surgeon: Leonie Douglas, MD;  Location: [facility]VASIVE [facility];  Service: Cardiovascular;  Laterality: Left;   AMPUTATION Right 11/16/2018   Procedure: RIGHT BELOW KNEE AMPUTATION;  Surgeon: [name], MD;  Location: [facility] OR;  Service: Orthopedics;  Laterality: Right;   AMPUTATION Left 02/03/2021   Procedure: LEFT BELOW KNEE AMPUTATION;  Surgeon: [name], MD;  Location[facility];  Service: Orthopedics;  Laterality: Left;   APPLICATION OF WOUND VAC  02/03/2021   Procedure:  APPLICATION OF WOUND VAC;  Surgeon: [name], MD;  Location: [facility] OR;  Service: Orthopedics;;   HAND SURGERY      ROS: Review of Systems Negative except as stated above  PHYSICAL EXAM: BP 132/72   Pulse 70   Temp 97.6 F (36.4 C) (Oral)   Ht 6\' 3"  (1.905 m)   Wt 236 lb (107 kg)   SpO2 98%   BMI 29.50 kg/m   Physical Exam   General appearance - alert, well appearing, elderly African-American male and in no distress Mental status - normal mood, behavior, speech, dress, motor activity, and thought processes Neck - supple, no significant adenopathy Chest - clear to auscultation, no wheezes, rales or rhonchi, symmetric air entry Heart - normal rate, regular rhythm, normal S1, S2, no murmurs, rubs, clicks or gallops Extremities -patient with bilateral below the knee amputation.  He is wearing his prosthetic legs     Latest Ref Rng & Units 09/16/2022   11:46 AM 07/29/2022    3:27 PM 03/22/2022   12:11 PM  CMP  Glucose 70 - 99 mg/dL 89  79  952   BUN 8 - 27 mg/dL 26  17  16    Creatinine 0.76 - 1.27 mg/dL 8.41  3.24  4.01   Sodium 134 - 144 mmol/L 142  146  141   Potassium 3.5 - 5.2 mmol/L 4.8  4.7  5.1   Chloride 96 - 106 mmol/L 105  111  102   CO2 20 - 29 mmol/L 22  20  24    Calcium 8.6 - 10.2 mg/dL 9.8  9.8  9.9   Total Protein 6.0 - 8.5 g/dL   7.7   Total Bilirubin 0.0 - 1.2 mg/dL   0.4   Alkaline Phos 44 - 121 IU/L   57   AST 0 - 40 IU/L   23   ALT 0 - 44 IU/L   15    Lipid Panel     Component  Value Date/Time   CHOL 168 03/22/2022 1211   TRIG 116 03/22/2022 1211   HDL 57 03/22/2022 1211   CHOLHDL 2.9 03/22/2022 1211   CHOLHDL 4.7 11/14/2018 0006   VLDL 9 11/14/2018 0006   LDLCALC 90 03/22/2022 1211    CBC    Component Value Date/Time   WBC 9.9 03/22/2022 1211   WBC 12.7 (H) 02/10/2021 0503   RBC 4.34 03/22/2022 1211   RBC 3.36 (L) 02/10/2021 0503   HGB 13.3 03/22/2022 1211   HCT 40.6 03/22/2022 1211   PLT 224 03/22/2022 1211   MCV 94 03/22/2022 1211    MCH 30.6 03/22/2022 1211   MCH 29.8 02/10/2021 0503   MCHC 32.8 03/22/2022 1211   MCHC 33.2 02/10/2021 0503   RDW 12.0 03/22/2022 1211   LYMPHSABS 5.1 (H) 03/22/2022 1211   MONOABS 0.5 02/10/2021 0503   EOSABS 0.2 03/22/2022 1211   BASOSABS 0.0 03/22/2022 1211    ASSESSMENT AND PLAN: 1. Type 2 diabetes mellitus with other specified complication, with long-term current use of insulin (HCC) (Primary) Diabetes type 2 treated with insulin and oral medicine -A1c not at goal but has improved from when it was last checked.  Continue Basaglar 20 units daily, Farxiga 10 mg daily.  Increase glimepiride from 4 mg daily to 4 mg in the morning and half a tablet in the evening. Advised to continue to monitor blood sugars. - POCT glycosylated hemoglobin (Hb A1C) - POCT glucose (manual entry) - atorvastatin (LIPITOR) 10 MG tablet; Take 1 tablet (10 mg total) by mouth daily. In the evening  Dispense: 90 tablet; Refill: 0 - Insulin Glargine (BASAGLAR KWIKPEN) 100 UNIT/ML; Inject 20 Units into the skin daily.  Dispense: 30 mL; Refill: 6 - Insulin Pen Needle (TECHLITE PLUS PEN NEEDLES) 32G X 4 MM MISC; Use 1 pen tip to inject insulin once daily.  Dispense: 100 each; Refill: 0 - CBC - Comprehensive metabolic panel - Lipid panel - Microalbumin / creatinine urine ratio - glimepiride (AMARYL) 4 MG tablet; Take 1 tablet (4 mg total) by mouth every morning AND 0.5 tablets (2 mg total) every evening.  Dispense: 135 tablet; Refill: 0 - Ambulatory referral to Ophthalmology  2. Hypertension associated with type 2 diabetes mellitus (HCC) Repeat blood pressure close to goal.  Continue amlodipine and low-salt diet - amLODipine (NORVASC) 10 MG tablet; Take 1 tablet (10 mg total) by mouth daily. (Please keep upcoming appointment for additional refills)  Dispense: 90 tablet; Refill: 1  3. Anemia, unspecified type Recheck CBC today to see whether he still needs to be on iron supplement - ferrous sulfate (FEROSUL)  325 (65 FE) MG tablet; Take 1 tablet (325 mg total) by mouth daily with breakfast.  Dispense: 90 tablet; Refill: 0  4. CKD stage 3a, GFR 45-59 ml/min (HCC) Discussed the importance of good diabetes and blood pressure control to help preserve kidney function.  Urine collected for microalbumin check  5. Non-recurrent unilateral inguinal hernia without obstruction or gangrene Patient with right inguinal hernia that is asymptomatic but is increased in size.  We discussed signs and symptoms that would suggest strangulated hernia and the need to be seen immediately.  Advised to avoid heavy lifting, pushing and pulling.  He is wanting to have this addressed electively so will refer to general surgery - Ambulatory referral to General Surgery  6. 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  Patient verbalized understanding of the  plan and was able to repeat key elements of the plan.   This documentation was completed using Paediatric nurse.  Any transcriptional errors are unintentional.  Orders Placed This Encounter  Procedures   CBC   Comprehensive metabolic panel   Lipid panel   Microalbumin / creatinine urine ratio   Ambulatory referral to General Surgery   Ambulatory referral to Ophthalmology   POCT glycosylated hemoglobin (Hb A1C)   POCT glucose (manual entry)     Requested Prescriptions   Signed Prescriptions Disp Refills   amLODipine (NORVASC) 10 MG tablet 90 tablet 1    Sig: Take 1 tablet (10 mg total) by mouth daily. (Please keep upcoming appointment for additional refills)   atorvastatin (LIPITOR) 10 MG tablet 90 tablet 0    Sig: Take 1 tablet (10 mg total) by mouth daily. In the evening   ferrous sulfate (FEROSUL) 325 (65 FE) MG tablet 90 tablet 0    Sig: Take 1 tablet (325 mg total) by mouth daily with breakfast.   Insulin Glargine (BASAGLAR KWIKPEN) 100 UNIT/ML 30 mL 6     Sig: Inject 20 Units into the skin daily.   Insulin Pen Needle (TECHLITE PLUS PEN NEEDLES) 32G X 4 MM MISC 100 each 0    Sig: Use 1 pen tip to inject insulin once daily.   dapagliflozin propanediol (FARXIGA) 10 MG TABS tablet 90 tablet 0    Sig: Take 1 tablet (10 mg total) by mouth daily before breakfast.Needs an office visit prior to future refills   glimepiride (AMARYL) 4 MG tablet 135 tablet 0    Sig: Take 1 tablet (4 mg total) by mouth every morning AND 0.5 tablets (2 mg total) every evening.    Return in about 3 months (around 10/18/2023) for Give f/u with Dr. Alvis Lemmings.  Jonah Blue, MD, FACP

## 2023-07-22 LAB — COMPREHENSIVE METABOLIC PANEL
ALT: 21 [IU]/L (ref 0–44)
AST: 26 [IU]/L (ref 0–40)
Albumin: 4.7 g/dL (ref 3.8–4.8)
Alkaline Phosphatase: 50 [IU]/L (ref 44–121)
BUN/Creatinine Ratio: 12 (ref 10–24)
BUN: 20 mg/dL (ref 8–27)
Bilirubin Total: 0.4 mg/dL (ref 0.0–1.2)
CO2: 23 mmol/L (ref 20–29)
Calcium: 10.2 mg/dL (ref 8.6–10.2)
Chloride: 104 mmol/L (ref 96–106)
Creatinine, Ser: 1.68 mg/dL — ABNORMAL HIGH (ref 0.76–1.27)
Globulin, Total: 2.7 g/dL (ref 1.5–4.5)
Glucose: 93 mg/dL (ref 70–99)
Potassium: 4.5 mmol/L (ref 3.5–5.2)
Sodium: 144 mmol/L (ref 134–144)
Total Protein: 7.4 g/dL (ref 6.0–8.5)
eGFR: 42 mL/min/{1.73_m2} — ABNORMAL LOW (ref >59)

## 2023-07-22 LAB — CBC
Hematocrit: 43.2 % (ref 37.5–51.0)
Hemoglobin: 14.4 g/dL (ref 13.0–17.7)
MCH: 31 pg (ref 26.6–33.0)
MCHC: 33.3 g/dL (ref 31.5–35.7)
MCV: 93 fL (ref 79–97)
Platelets: 217 10*3/uL (ref 150–450)
RBC: 4.65 x10E6/uL (ref 4.14–5.80)
RDW: 12.6 % (ref 11.6–15.4)
WBC: 12.1 10*3/uL — ABNORMAL HIGH (ref 3.4–10.8)

## 2023-07-22 LAB — LIPID PANEL
Chol/HDL Ratio: 2.7 {ratio} (ref 0.0–5.0)
Cholesterol, Total: 138 mg/dL (ref 100–199)
HDL: 51 mg/dL (ref >39)
LDL Chol Calc (NIH): 64 mg/dL (ref 0–99)
Triglycerides: 131 mg/dL (ref 0–149)
VLDL Cholesterol Cal: 23 mg/dL (ref 5–40)

## 2023-07-22 NOTE — Progress Notes (Signed)
 Let patient know that his white blood cell count is mildly elevated compared to when last checked in 2023.  These are the blood cells that help fight infection.  He should have this repeated on next visit with his PCP to see if it has normalized. -Kidney function is not at 100% but stable and unchanged when compared to a year ago. -Cholesterol levels are normal.

## 2023-07-23 LAB — MICROALBUMIN / CREATININE URINE RATIO
Creatinine, Urine: 77.8 mg/dL
Microalb/Creat Ratio: 28 mg/g{creat} (ref 0–29)
Microalbumin, Urine: 21.6 ug/mL

## 2023-07-24 ENCOUNTER — Other Ambulatory Visit: Payer: Self-pay

## 2023-08-16 ENCOUNTER — Other Ambulatory Visit: Payer: Self-pay

## 2023-08-18 ENCOUNTER — Telehealth: Payer: Self-pay

## 2023-08-18 NOTE — Telephone Encounter (Signed)
 Submitted application for FARXIGA to [employer] for patient assistance.   Phone: [phone]

## 2023-08-21 ENCOUNTER — Other Ambulatory Visit: Payer: Self-pay

## 2023-08-31 ENCOUNTER — Other Ambulatory Visit: Payer: Self-pay

## 2023-08-31 ENCOUNTER — Other Ambulatory Visit: Payer: Self-pay | Admitting: Family Medicine

## 2023-08-31 DIAGNOSIS — E1165 Type 2 diabetes mellitus with hyperglycemia: Secondary | ICD-10-CM

## 2023-09-01 ENCOUNTER — Other Ambulatory Visit: Payer: Self-pay

## 2023-09-01 MED ORDER — ACCU-CHEK GUIDE TEST VI STRP
ORAL_STRIP | 3 refills | Status: DC
  Filled 2023-09-01: qty 100, 33d supply, fill #0
  Filled 2023-10-18: qty 300, 100d supply, fill #1
  Filled 2023-10-18 – 2023-10-25 (×2): qty 100, 33d supply, fill #1

## 2023-09-01 NOTE — Telephone Encounter (Signed)
 Requested medication (s) are due for refill today: Yes  Requested medication (s) are on the active medication list: Yes  Last refill:  04/20/23  Future visit scheduled: Yes  Notes to clinic:  Unable to refill per protocol, medication not assigned to the refill protocol.      Requested Prescriptions  Pending Prescriptions Disp Refills   glucose blood (ACCU-CHEK GUIDE TEST) test strip 100 each 0    Sig: Use as instructed     There is no refill protocol information for this order

## 2023-09-04 ENCOUNTER — Other Ambulatory Visit: Payer: Self-pay

## 2023-09-14 ENCOUNTER — Other Ambulatory Visit (HOSPITAL_BASED_OUTPATIENT_CLINIC_OR_DEPARTMENT_OTHER): Payer: Self-pay

## 2023-09-14 ENCOUNTER — Other Ambulatory Visit: Payer: Self-pay

## 2023-10-04 ENCOUNTER — Other Ambulatory Visit: Payer: Self-pay

## 2023-10-18 ENCOUNTER — Other Ambulatory Visit: Payer: Self-pay

## 2023-10-18 ENCOUNTER — Other Ambulatory Visit (HOSPITAL_COMMUNITY): Payer: Self-pay

## 2023-10-18 ENCOUNTER — Ambulatory Visit: Payer: Medicare Other | Attending: Family Medicine | Admitting: Family Medicine

## 2023-10-18 ENCOUNTER — Other Ambulatory Visit: Payer: Self-pay | Admitting: Internal Medicine

## 2023-10-18 ENCOUNTER — Other Ambulatory Visit (HOSPITAL_BASED_OUTPATIENT_CLINIC_OR_DEPARTMENT_OTHER): Payer: Self-pay

## 2023-10-18 ENCOUNTER — Encounter: Payer: Self-pay | Admitting: Family Medicine

## 2023-10-18 VITALS — BP 125/75 | HR 76 | Ht 75.0 in | Wt 228.0 lb

## 2023-10-18 DIAGNOSIS — N183 Chronic kidney disease, stage 3 unspecified: Secondary | ICD-10-CM | POA: Diagnosis not present

## 2023-10-18 DIAGNOSIS — E1122 Type 2 diabetes mellitus with diabetic chronic kidney disease: Secondary | ICD-10-CM | POA: Diagnosis not present

## 2023-10-18 DIAGNOSIS — E1169 Type 2 diabetes mellitus with other specified complication: Secondary | ICD-10-CM | POA: Diagnosis not present

## 2023-10-18 DIAGNOSIS — E119 Type 2 diabetes mellitus without complications: Secondary | ICD-10-CM | POA: Diagnosis present

## 2023-10-18 DIAGNOSIS — Z89511 Acquired absence of right leg below knee: Secondary | ICD-10-CM

## 2023-10-18 DIAGNOSIS — Z7984 Long term (current) use of oral hypoglycemic drugs: Secondary | ICD-10-CM

## 2023-10-18 DIAGNOSIS — Z89512 Acquired absence of left leg below knee: Secondary | ICD-10-CM | POA: Diagnosis not present

## 2023-10-18 DIAGNOSIS — I129 Hypertensive chronic kidney disease with stage 1 through stage 4 chronic kidney disease, or unspecified chronic kidney disease: Secondary | ICD-10-CM

## 2023-10-18 DIAGNOSIS — Z794 Long term (current) use of insulin: Secondary | ICD-10-CM

## 2023-10-18 DIAGNOSIS — D649 Anemia, unspecified: Secondary | ICD-10-CM

## 2023-10-18 LAB — POCT GLYCOSYLATED HEMOGLOBIN (HGB A1C): HbA1c, POC (controlled diabetic range): 7.7 % — AB (ref 0.0–7.0)

## 2023-10-18 MED ORDER — GLIMEPIRIDE 4 MG PO TABS
ORAL_TABLET | ORAL | 1 refills | Status: DC
  Filled 2023-10-18: qty 135, 90d supply, fill #0
  Filled 2024-01-10: qty 135, 90d supply, fill #1

## 2023-10-18 MED ORDER — AMLODIPINE BESYLATE 10 MG PO TABS
10.0000 mg | ORAL_TABLET | Freq: Every day | ORAL | 1 refills | Status: DC
  Filled 2023-10-18: qty 90, 90d supply, fill #0
  Filled 2024-01-10: qty 90, 90d supply, fill #1

## 2023-10-18 MED ORDER — DAPAGLIFLOZIN PROPANEDIOL 10 MG PO TABS
10.0000 mg | ORAL_TABLET | Freq: Every day | ORAL | 1 refills | Status: DC
  Filled 2023-10-18: qty 30, 30d supply, fill #0
  Filled 2023-10-25: qty 90, 90d supply, fill #0

## 2023-10-18 MED ORDER — ATORVASTATIN CALCIUM 10 MG PO TABS
10.0000 mg | ORAL_TABLET | Freq: Every day | ORAL | 1 refills | Status: AC
Start: 2023-10-18 — End: ?
  Filled 2023-10-18 – 2023-12-11 (×2): qty 90, 90d supply, fill #0
  Filled 2024-03-11 (×2): qty 90, 90d supply, fill #1

## 2023-10-18 MED ORDER — BASAGLAR KWIKPEN 100 UNIT/ML ~~LOC~~ SOPN
20.0000 [IU] | PEN_INJECTOR | Freq: Every day | SUBCUTANEOUS | 6 refills | Status: AC
  Filled 2023-10-18: qty 6, 30d supply, fill #0
  Filled 2023-11-10 (×2): qty 6, 30d supply, fill #1
  Filled 2023-12-11 (×2): qty 6, 30d supply, fill #2
  Filled 2024-01-09 (×2): qty 6, 30d supply, fill #3
  Filled 2024-02-05 (×2): qty 6, 30d supply, fill #4
  Filled 2024-03-04 (×2): qty 6, 30d supply, fill #5
  Filled 2024-04-05 (×2): qty 6, 30d supply, fill #6
  Filled 2024-05-06 (×2): qty 6, 30d supply, fill #7
  Filled 2024-05-29: qty 6, 30d supply, fill #8
  Filled 2024-07-01 (×2): qty 6, 30d supply, fill #9

## 2023-10-18 MED ORDER — FERROUS SULFATE 325 (65 FE) MG PO TABS
325.0000 mg | ORAL_TABLET | Freq: Every day | ORAL | 1 refills | Status: DC
  Filled 2023-10-18 – 2023-10-24 (×2): qty 90, 90d supply, fill #0
  Filled 2024-01-15: qty 90, 90d supply, fill #1

## 2023-10-18 NOTE — Patient Instructions (Addendum)
 VISIT SUMMARY:  Today, you came in for a follow-up visit and medication refills. We discussed your diabetes management, blood pressure, prosthetics, and other health concerns.  YOUR PLAN:  -TYPE 2 DIABETES MELLITUS WITH HYPERGLYCEMIA: Type 2 diabetes is a condition where your body does not use insulin  properly, leading to high blood sugar levels. Your A1c has improved to 7.7, which is good progress. Continue taking glimepiride  as prescribed, maintain your dietary modifications, and regularly monitor your blood sugar levels. Make sure your blood sugar stays above 80 mg/dL.  -BILATERAL BELOW-KNEE AMPUTATION: You have prosthetics for both legs below the knee and are adjusting well, though you experience occasional soreness. Continue monitoring for any sores or discomfort and stay active.  -INGUINAL HERNIA: An inguinal hernia is when tissue pushes through a weak spot in your groin muscles. Your hernia is currently not causing symptoms, but you should reschedule your appointment with general surgery for an evaluation.  -HYPERTENSION: Hypertension is high blood pressure. Your recent readings were elevated, possibly due to stress. Ensure you continue taking your blood pressure medication as prescribed and recheck your blood pressure after resting.  Your blood pressure upon repeat is normal.  Continue your current medications.  -IMMUNIZATION STATUS: You have declined the pneumonia and shingles vaccines due to concerns. We respect your decision but will discuss the potential benefits and risks of these vaccines at future visits.  INSTRUCTIONS:  Please reschedule your appointment with general surgery for your inguinal hernia evaluation. Continue taking your medications as prescribed and monitor your blood sugar and blood pressure regularly. We will discuss your immunization options again at your next visit.

## 2023-10-18 NOTE — Progress Notes (Signed)
 Subjective:  Patient ID: Joseph [name], male    DOB: 1946/03/28  Age: 78 y.o. MRN: [id number]  CC: Medical Management of Chronic Issues     Discussed the use of AI scribe software for clinical note transcription with the patient, who gave verbal consent to proceed.  History of Present Illness Joseph [name] is a 78 year old male with  a history of type 2 diabetes mellitus (A1c 10.0), b/l BKA, hypertension, stage II CKD  who presents for follow-up and medication refills.  His diabetes management shows improvement with an A1c of 7.7, down from 8.0. He takes glimepiride , 4 mg in the morning and 2 mg in the evening along with Lantus  and Farxiga . His blood sugar was 106 mg/dL this morning, though it can rise with high sugar intake, which he manages by drinking water and avoiding sugary drinks.  His blood pressure was elevated, possibly due to stress he states, but he is compliant with his medication regimen, having taken it this morning.  He has bilateral below-knee prosthetics and does not experience soreness from his use but no phantom pain. He is active and performs household activities without nerve pain.    Past Medical History:  Diagnosis Date   Cellulitis of right lower extremity    Diabetes mellitus type 2 in nonobese [facility])    Gangrene of right foot (HCC)    Hypertension    Open wound of right foot     Past Surgical History:  Procedure Laterality Date   ABDOMINAL AORTOGRAM W/LOWER EXTREMITY Left 01/27/2021   Procedure: ABDOMINAL AORTOGRAM W/LOWER EXTREMITY;  Surgeon: Carlene Che, MD;  Location: [facility];  Service: Cardiovascular;  Laterality: Left;   AMPUTATION Right 11/16/2018   Procedure: RIGHT BELOW KNEE AMPUTATION;  Surgeon: Timothy Ford, MD;  Location: [facility] OR;  Service: Orthopedics;  Laterality: Right;   AMPUTATION Left 02/03/2021   Procedure: LEFT BELOW KNEE AMPUTATION;  Surgeon: Timothy Ford, MD;  Location: [facility] OR;  Service: Orthopedics;  Laterality: Left;    APPLICATION OF WOUND VAC  02/03/2021   Procedure: APPLICATION OF WOUND VAC;  Surgeon: Timothy Ford, MD;  Location: [facility] OR;  Service: Orthopedics;;   HAND SURGERY      Family History  Problem Relation Age of Onset   Diabetes Father    Cancer Neg Hx    CAD Neg Hx     Social History   Socioeconomic History   Marital status: Divorced    Spouse name: Not on file   Number of children: Not on file   Years of education: Not on file   Highest education level: Not on file  Occupational History   Occupation: retired     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  Tobacco Use   Smoking status: Never   Smokeless tobacco: Never  Vaping Use   Vaping status: Never Used  Substance and Sexual Activity   Alcohol use: Not Currently   Drug use: No   Sexual activity: Not on file  Other Topics Concern   Not on file  Social History Narrative   Lives alone, 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   On fixed Social security income   Has a daughter who is a Lawyer and son does not speak with them every day   Most reliable family members reported to live in [place]   Divorced but remains friends with his ex wife, Burlene Carpen   Has advance directives but has not as 11/03/20  [facility] a copy [facility]   Goes to church      Social Drivers of Health   Financial Resource Strain: Low Risk  (07/21/2023)   Overall Financial Resource Strain (CARDIA)    Difficulty of Paying Living Expenses: Not very hard  Food Insecurity: No Food Insecurity (07/21/2023)   Hunger Vital Sign    Worried About Running Out of Food in the Last Year: Never true    Ran Out of Food in the Last Year: Never true  Transportation Needs: No Transportation Needs (07/21/2023)   PRAPARE - Administrator, Civil Service (Medical): No    Lack of Transportation (Non-Medical): No  Physical Activity: Sufficiently Active (07/21/2023)   Exercise Vital Sign    Days of Exercise per Week: 5 days    Minutes of Exercise per Session: 40  min  Stress: No Stress Concern Present (07/21/2023)   [employer] of Occupational Health - Occupational Stress Questionnaire    Feeling of Stress : Not at all  Social Connections: Moderately Isolated (07/21/2023)   Social Connection and Isolation Panel [NHANES]    Frequency of Communication with Friends and Family: Once a week    Frequency of Social Gatherings with Friends and Family: Twice a week    Attends Religious Services: More than 4 times per year    Active Member of [employer] or Organizations: No    Attends Banker Meetings: Never    Marital Status: Widowed    Allergies  Allergen Reactions   Other Other (See Comments)    04/02/21 Mr Sanderfer reports a few previous Flu vaccines caused him to have increased symptoms "to get it" Does not prefer to have the flu vaccine but confirms he practices pandemic precautions (masks, distancing, hand washing)    Outpatient Medications Prior to Visit  Medication Sig Dispense Refill   Accu-Chek Softclix Lancets lancets Use to check blood sugars three times per day 100 each 11   aspirin  EC 81 MG EC tablet Take 1 tablet (81 mg total) by mouth daily. Swallow whole. 30 tablet 11   Blood Glucose Monitoring Suppl (ACCU-CHEK GUIDE) w/Device KIT Check blood sugar three times daily. 1 kit 0   glucose blood (ACCU-CHEK GUIDE TEST) test strip Use to check blood sugar three times daily. 300 each 3   Insulin  Pen Needle (TECHLITE PLUS PEN NEEDLES) 32G X 4 MM MISC Use 1 pen tip to inject insulin  once daily. 100 each 0   Misc. Devices MISC leg extension and foot rest for the left side. 1 each 0   Multiple Vitamins-Minerals (ONE-A-DAY MENS 50+ ADVANTAGE) TABS Take 1 tablet by mouth daily with breakfast.     polyethylene glycol (MIRALAX  / GLYCOLAX ) 17 g packet Take 17 g by mouth daily as needed for mild constipation. 14 each 0   amLODipine  (NORVASC ) 10 MG tablet Take 1 tablet (10 mg total) by mouth daily. (Please keep upcoming appointment for additional  refills) 90 tablet 1   atorvastatin  (LIPITOR) 10 MG tablet Take 1 tablet (10 mg total) by mouth daily. In the evening 90 tablet 0   dapagliflozin  propanediol (FARXIGA ) 10 MG TABS tablet Take 1 tablet (10 mg total) by mouth daily before breakfast.Needs an office visit prior to future refills 90 tablet 0   ferrous sulfate  (FEROSUL) 325 (65 FE) MG tablet Take 1 tablet (325 mg total) by mouth daily with breakfast. 90 tablet 0   glimepiride  (AMARYL ) 4 MG tablet Take 1 tablet (4 mg total) by  mouth every morning AND 0.5 tablets (2 mg total) every evening. 135 tablet 0   Insulin  Glargine (BASAGLAR  KWIKPEN) 100 UNIT/ML Inject 20 Units into the skin daily. 30 mL 6   No facility-administered medications prior to visit.     ROS Review of Systems  Constitutional:  Negative for activity change and appetite change.  HENT:  Negative for sinus pressure and sore throat.   Respiratory:  Negative for chest tightness, shortness of breath and wheezing.   Cardiovascular:  Negative for chest pain and palpitations.  Gastrointestinal:  Negative for abdominal distention, abdominal pain and constipation.  Genitourinary: Negative.   Musculoskeletal: Negative.   Psychiatric/Behavioral:  Negative for behavioral problems and dysphoric mood.     Objective:  BP 125/75   Pulse 76   Ht 6\' 3"  (1.905 m)   Wt 228 lb (103.4 kg)   SpO2 99%   BMI 28.50 kg/m      10/18/2023    2:37 PM 10/18/2023    2:01 PM 07/21/2023    4:41 PM  BP/Weight  Systolic BP 125 144 132  Diastolic BP 75 80 72  Wt. (Lbs)  228   BMI  28.5 kg/m2       Physical Exam Constitutional:      Appearance: He is well-developed.  Cardiovascular:     Rate and Rhythm: Normal rate.     Heart sounds: Normal heart sounds. No murmur heard. Pulmonary:     Effort: Pulmonary effort is normal.     Breath sounds: Normal breath sounds. No wheezing or rales.  Chest:     Chest wall: No tenderness.  Abdominal:     General: Bowel sounds are normal. There  is no distension.     Palpations: Abdomen is soft. There is no mass.     Tenderness: There is no abdominal tenderness.  Musculoskeletal:     Comments: Bilateral BKA  Neurological:     Mental Status: He is alert and oriented to person, place, and time.  Psychiatric:        Mood and Affect: Mood normal.        Latest Ref Rng & Units 07/21/2023    4:54 PM 09/16/2022   11:46 AM 07/29/2022    3:27 PM  CMP  Glucose 70 - 99 mg/dL 93  89  79   BUN 8 - 27 mg/dL 20  26  17    Creatinine 0.76 - 1.27 mg/dL 4.09  8.11  9.14   Sodium 134 - 144 mmol/L 144  142  146   Potassium 3.5 - 5.2 mmol/L 4.5  4.8  4.7   Chloride 96 - 106 mmol/L 104  105  111   CO2 20 - 29 mmol/L 23  22  20    Calcium  8.6 - 10.2 mg/dL 78.2  9.8  9.8   Total Protein 6.0 - 8.5 g/dL 7.4     Total Bilirubin 0.0 - 1.2 mg/dL 0.4     Alkaline Phos 44 - 121 IU/L 50     AST 0 - 40 IU/L 26     ALT 0 - 44 IU/L 21       Lipid Panel     Component Value Date/Time   CHOL 138 07/21/2023 1654   TRIG 131 07/21/2023 1654   HDL 51 07/21/2023 1654   CHOLHDL 2.7 07/21/2023 1654   CHOLHDL 4.7 11/14/2018 0006   VLDL 9 11/14/2018 0006   LDLCALC 64 07/21/2023 1654    CBC    Component Value Date/Time  WBC 12.1 (H) 07/21/2023 1654   WBC 12.7 (H) 02/10/2021 0503   RBC 4.65 07/21/2023 1654   RBC 3.36 (L) 02/10/2021 0503   HGB 14.4 07/21/2023 1654   HCT 43.2 07/21/2023 1654   PLT 217 07/21/2023 1654   MCV 93 07/21/2023 1654   MCH 31.0 07/21/2023 1654   MCH 29.8 02/10/2021 0503   MCHC 33.3 07/21/2023 1654   MCHC 33.2 02/10/2021 0503   RDW 12.6 07/21/2023 1654   LYMPHSABS 5.1 (H) 03/22/2022 1211   MONOABS 0.5 02/10/2021 0503   EOSABS 0.2 03/22/2022 1211   BASOSABS 0.0 03/22/2022 1211    Lab Results  Component Value Date   HGBA1C 7.7 (A) 10/18/2023      1. Type 2 diabetes mellitus with other specified complication, with long-term current use of insulin  (HCC) (Primary) Suboptimally controlled glucose of 7.7 but this has  improved from 8.0 previously Due to age will be less strict with glycemic control Continue current regimen Counseled on Diabetic diet, my plate method, 657 minutes of moderate intensity exercise/week Blood sugar logs with fasting goals of 80-120 mg/dl, random of less than 846 and in the event of sugars less than 60 mg/dl or greater than 962 mg/dl encouraged to notify the clinic. Advised on the need for annual eye exams, annual foot exams, Pneumonia vaccine. - POCT glycosylated hemoglobin (Hb A1C) - atorvastatin  (LIPITOR) 10 MG tablet; Take 1 tablet (10 mg total) by mouth daily. In the evening  Dispense: 90 tablet; Refill: 1 - dapagliflozin  propanediol (FARXIGA ) 10 MG TABS tablet; Take 1 tablet (10 mg total) by mouth daily before breakfast.  Dispense: 90 tablet; Refill: 1 - glimepiride  (AMARYL ) 4 MG tablet; Take 1 tablet (4 mg total) by mouth every morning AND 0.5 tablets (2 mg total) every evening.  Dispense: 135 tablet; Refill: 1 - Insulin  Glargine (BASAGLAR  KWIKPEN) 100 UNIT/ML; Inject 20 Units into the skin daily.  Dispense: 30 mL; Refill: 6  2. Hypertension in stage 3 chronic kidney disease due to type 2 diabetes mellitus (HCC) Blood pressure was initially elevated but normalized on repeat Continue current regimen Counseled on blood pressure goal of less than 130/80, low-sodium, DASH diet, medication compliance, 150 minutes of moderate intensity exercise per week. Discussed medication compliance, adverse effects. - amLODipine  (NORVASC ) 10 MG tablet; Take 1 tablet (10 mg total) by mouth daily. (Please keep upcoming appointment for additional refills)  Dispense: 90 tablet; Refill: 1  3. S/P bilateral BKA (below knee amputation) (HCC) Stable No phantom pain  4. Long term current use of oral hypoglycemic drug - dapagliflozin  propanediol (FARXIGA ) 10 MG TABS tablet; Take 1 tablet (10 mg total) by mouth daily before breakfast.  Dispense: 90 tablet; Refill: 1 - glimepiride  (AMARYL ) 4 MG tablet;  Take 1 tablet (4 mg total) by mouth every morning AND 0.5 tablets (2 mg total) every evening.  Dispense: 135 tablet; Refill: 1  5. Long term current use of insulin  (HCC) - Insulin  Glargine (BASAGLAR  KWIKPEN) 100 UNIT/ML; Inject 20 Units into the skin daily.  Dispense: 30 mL; Refill: 6  Meds ordered this encounter  Medications   amLODipine  (NORVASC ) 10 MG tablet    Sig: Take 1 tablet (10 mg total) by mouth daily. (Please keep upcoming appointment for additional refills)    Dispense:  90 tablet    Refill:  1   atorvastatin  (LIPITOR) 10 MG tablet    Sig: Take 1 tablet (10 mg total) by mouth daily. In the evening    Dispense:  90 tablet  Refill:  1   dapagliflozin  propanediol (FARXIGA ) 10 MG TABS tablet    Sig: Take 1 tablet (10 mg total) by mouth daily before breakfast.    Dispense:  90 tablet    Refill:  1   glimepiride  (AMARYL ) 4 MG tablet    Sig: Take 1 tablet (4 mg total) by mouth every morning AND 0.5 tablets (2 mg total) every evening.    Dispense:  135 tablet    Refill:  1   Insulin  Glargine (BASAGLAR  KWIKPEN) 100 UNIT/ML    Sig: Inject 20 Units into the skin daily.    Dispense:  30 mL    Refill:  6    Follow-up: Return in about 3 months (around 01/18/2024) for Chronic medical conditions.       Joaquin Mulberry, MD, FAAFP. [facility] [place], [place] [phone]   10/18/2023, 5:12 PM

## 2023-10-24 ENCOUNTER — Other Ambulatory Visit: Payer: Self-pay

## 2023-10-24 ENCOUNTER — Other Ambulatory Visit (HOSPITAL_BASED_OUTPATIENT_CLINIC_OR_DEPARTMENT_OTHER): Payer: Self-pay

## 2023-10-25 ENCOUNTER — Other Ambulatory Visit: Payer: Self-pay | Admitting: Pharmacist

## 2023-10-25 ENCOUNTER — Other Ambulatory Visit: Payer: Self-pay

## 2023-10-25 MED ORDER — TRUE METRIX BLOOD GLUCOSE TEST VI STRP
ORAL_STRIP | 6 refills | Status: AC
  Filled 2023-10-25: qty 100, 30d supply, fill #0
  Filled 2023-11-20 (×2): qty 100, 30d supply, fill #1
  Filled 2023-12-18: qty 100, 33d supply, fill #2
  Filled 2024-01-22 – 2024-02-02 (×4): qty 100, 33d supply, fill #3
  Filled 2024-03-04: qty 100, 33d supply, fill #4
  Filled 2024-04-08 – 2024-04-12 (×4): qty 100, 33d supply, fill #5

## 2023-10-25 MED ORDER — TRUEPLUS LANCETS 28G MISC
6 refills | Status: AC
  Filled 2023-10-25: qty 100, 30d supply, fill #0
  Filled 2023-11-20 – 2023-11-23 (×3): qty 100, 30d supply, fill #1

## 2023-10-25 MED ORDER — TRUE METRIX METER W/DEVICE KIT
PACK | 0 refills | Status: AC
  Filled 2023-10-25: qty 1, 30d supply, fill #0

## 2023-10-26 ENCOUNTER — Other Ambulatory Visit: Payer: Self-pay

## 2023-11-10 ENCOUNTER — Other Ambulatory Visit: Payer: Self-pay

## 2023-11-13 ENCOUNTER — Other Ambulatory Visit: Payer: Self-pay

## 2023-11-20 ENCOUNTER — Other Ambulatory Visit: Payer: Self-pay

## 2023-11-21 ENCOUNTER — Ambulatory Visit (INDEPENDENT_AMBULATORY_CARE_PROVIDER_SITE_OTHER): Admitting: Family

## 2023-11-21 ENCOUNTER — Encounter: Payer: Self-pay | Admitting: Family

## 2023-11-21 ENCOUNTER — Other Ambulatory Visit: Payer: Self-pay

## 2023-11-21 DIAGNOSIS — Z89511 Acquired absence of right leg below knee: Secondary | ICD-10-CM | POA: Diagnosis not present

## 2023-11-21 DIAGNOSIS — Z89512 Acquired absence of left leg below knee: Secondary | ICD-10-CM

## 2023-11-21 NOTE — Progress Notes (Signed)
 Office Visit Note   Patient: Joseph [name]           Date of Birth: 1945-08-11           MRN: [id number] Visit Date: 11/21/2023              Requested by: Delbert Clam, MD [address] [place] [address] [place],  [place] [address] PCP: Delbert Clam, MD  Chief Complaint  Patient presents with   Right Leg - Follow-up    HX bilat BKA    Left Leg - Follow-up      HPI: The patient is a 78 year old gentleman who is status post bilateral below-knee amputations he is quite active in the home mows his own lawn he currently is having issues with his bilateral prostheses the foot to the right prosthesis is broken and requires replacement he has had volume loss and require socket replacement on the left.  He currently does not have any core morbidities that will impact his mobility or his ability to function with his prosthetics.  He is a K3 level ambulator who is quite active with ambulation activity inside and outside the home over all types of environmental barriers  Patient is an existing bilateral transtibial  amputee.  Patient's current comorbidities are not expected to impact the ability to function with the prescribed prosthesis. Patient verbally communicates a strong desire to use a prosthesis. Patient currently requires mobility aids to ambulate without a prosthesis.  Expects not to use mobility aids with a new prosthesis.  Patient is a K3 level ambulator that spends a lot of time walking around on uneven terrain over obstacles, up and down stairs, and ambulates with a variable cadence.     Assessment & Plan: Visit Diagnoses: No diagnosis found.  Plan: Given an order for prosthesis replacement bilaterally as well as foot replacement on the right.  Supplies for bilateral prostheses.  Follow-Up Instructions: Return if symptoms worsen or fail to improve.   Ortho Exam  Patient is alert, oriented, no adenopathy, well-dressed, normal affect, normal respiratory effort. On  examination bilateral residual limbs are well consolidated well-healed is no hyperkeratotic tissue buildup no impending ulceration    Imaging: No results found. No images are attached to the encounter.  Labs: Lab Results  Component Value Date   HGBA1C 7.7 (A) 10/18/2023   HGBA1C 8.0 (A) 07/21/2023   HGBA1C 10.0 (H) 07/29/2022   ESRSEDRATE 120 (H) 01/30/2021   CRP 14.5 (H) 01/30/2021   REPTSTATUS 02/04/2021 FINAL 01/30/2021   REPTSTATUS 02/04/2021 FINAL 01/30/2021   CULT  01/30/2021    NO GROWTH 5 DAYS Performed at [facility], [address], [place], [place] [address]    CULT  01/30/2021    NO GROWTH 5 DAYS Performed at [facility], [address] [place], [place] [address]      Lab Results  Component Value Date   ALBUMIN 4.7 07/21/2023   ALBUMIN 4.5 03/22/2022   ALBUMIN 2.5 (L) 02/05/2021    Lab Results  Component Value Date   MG 1.8 02/04/2021   MG 1.7 02/03/2021   MG 2.0 11/15/2018   No results found for: VD25OH  No results found for: PREALBUMIN    Latest Ref Rng & Units 07/21/2023    4:54 PM 03/22/2022   12:11 PM 02/10/2021    5:03 AM  CBC EXTENDED  WBC 3.4 - 10.8 x10E3/uL 12.1  9.9  12.7   RBC 4.14 - 5.80 x10E6/uL 4.65  4.34  3.36   Hemoglobin 13.0 - 17.7 g/dL 85.5  86.6  89.9   HCT 37.5 - 51.0 % 43.2  40.6  30.1   Platelets 150 - 450 x10E3/uL 217  224  443   NEUT# 1.4 - 7.0 x10E3/uL  4.0  6.6   Lymph# 0.7 - 3.1 x10E3/uL  5.1  5.0      There is no height or weight on file to calculate BMI.  Orders:  No orders of the defined types were placed in this encounter.  No orders of the defined types were placed in this encounter.    Procedures: No procedures performed  Clinical Data: No additional findings.  ROS:  All other systems negative, except as noted in the HPI. Review of Systems  Objective: Vital Signs: There were no vitals taken for this visit.  Specialty Comments:  No specialty comments available.  PMFS  History: 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)    Leukocytosis    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HCC) 11/20/2018   Paroxysmal atrial fibrillation (HCC)    Diabetic polyneuropathy associated with type 2 diabetes mellitus (HCC)    PVD (peripheral vascular disease) (HCC)    Critical lower limb ischemia (HCC)    Sepsis with acute renal failure (HCC) 11/13/2018   Acute renal failure superimposed on stage 2 chronic kidney disease (HCC)    'Light-for-dates' infant with signs of fetal malnutrition 10/17/2018   DM (diabetes mellitus) (HCC) 07/02/2012   Hypertension associated with diabetes (HCC) 07/02/2012   Past Medical History:  Diagnosis Date   Cellulitis of right lower extremity    Diabetes mellitus type 2 in nonobese (HCC)    Gangrene of right foot (HCC)    Hypertension    Open wound of right foot     Family History  Problem Relation Age of Onset   Diabetes Father    Cancer Neg Hx    CAD Neg Hx     Past Surgical History:  Procedure Laterality Date   ABDOMINAL AORTOGRAM W/LOWER EXTREMITY Left 01/27/2021   Procedure: ABDOMINAL AORTOGRAM W/LOWER EXTREMITY;  Surgeon: Magda Debby SAILOR, MD;  Location: [facility];  Service: Cardiovascular;  Laterality: Left;   AMPUTATION Right 11/16/2018   Procedure: RIGHT BELOW KNEE AMPUTATION;  Surgeon: Harden Jerona GAILS, MD;  Location: [facility] OR;  Service: Orthopedics;  Laterality:  Right;   AMPUTATION Left 02/03/2021   Procedure: LEFT BELOW KNEE AMPUTATION;  Surgeon: Harden Jerona GAILS, MD;  Location: [facility] OR;  Service: Orthopedics;  Laterality: Left;   APPLICATION OF WOUND VAC  02/03/2021   Procedure: APPLICATION OF WOUND VAC;  Surgeon: Harden Jerona GAILS, MD;  Location: [facility] OR;  Service: Orthopedics;;   HAND SURGERY     Social History   Occupational History   Occupation: retired     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

## 2023-11-22 ENCOUNTER — Other Ambulatory Visit: Payer: Self-pay

## 2023-11-22 ENCOUNTER — Other Ambulatory Visit: Payer: Self-pay | Admitting: Internal Medicine

## 2023-11-22 DIAGNOSIS — E1169 Type 2 diabetes mellitus with other specified complication: Secondary | ICD-10-CM

## 2023-11-22 MED ORDER — INSUPEN PEN NEEDLES 32G X 4 MM MISC
1.0000 | Freq: Every day | 2 refills | Status: AC
  Filled 2023-11-22: qty 100, 100d supply, fill #0
  Filled 2024-03-01: qty 100, 100d supply, fill #1
  Filled 2024-05-29: qty 100, 100d supply, fill #2

## 2023-11-23 ENCOUNTER — Other Ambulatory Visit: Payer: Self-pay

## 2023-11-29 ENCOUNTER — Other Ambulatory Visit: Payer: Self-pay

## 2023-12-11 ENCOUNTER — Other Ambulatory Visit: Payer: Self-pay

## 2023-12-12 ENCOUNTER — Other Ambulatory Visit: Payer: Self-pay

## 2023-12-18 ENCOUNTER — Telehealth: Payer: Self-pay | Admitting: Family Medicine

## 2023-12-18 ENCOUNTER — Other Ambulatory Visit: Payer: Self-pay

## 2023-12-18 NOTE — Telephone Encounter (Signed)
 Copied from CRM [id number]. Topic: Referral - Request for Referral >> Dec 18, 2023 11:33 AM Tonda B wrote:  Did the patient discuss referral with their provider in the last year? Yes (If No - schedule appointment) (If Yes - send message)  Appointment offered? No  Type of order/referral and detailed reason for visit: hernia  Preference of office, provider, location: [facility]    If referral order, have you been seen by this specialty before? No (If Yes, this issue or another issue? When? Where?  Can we respond through MyChart? No

## 2023-12-18 NOTE — Telephone Encounter (Signed)
 Can we open his General surgery referral or do we need to place another one.

## 2023-12-19 ENCOUNTER — Other Ambulatory Visit: Payer: Self-pay

## 2023-12-25 ENCOUNTER — Other Ambulatory Visit: Payer: Self-pay

## 2023-12-26 ENCOUNTER — Other Ambulatory Visit: Payer: Self-pay

## 2023-12-26 NOTE — Telephone Encounter (Signed)
 LVM informing patient that referral was placed in February and he will need to reach out to them to schedule.Contact information was provided.

## 2024-01-09 ENCOUNTER — Other Ambulatory Visit: Payer: Self-pay

## 2024-01-10 ENCOUNTER — Encounter (HOSPITAL_COMMUNITY): Payer: Self-pay

## 2024-01-10 ENCOUNTER — Other Ambulatory Visit: Payer: Self-pay

## 2024-01-10 ENCOUNTER — Ambulatory Visit: Payer: Medicare Other | Attending: Family Medicine

## 2024-01-10 ENCOUNTER — Emergency Department (HOSPITAL_COMMUNITY): Admission: EM | Admit: 2024-01-10 | Discharge: 2024-01-10 | Disposition: A | Discharge status: Home / Self Care

## 2024-01-10 VITALS — Ht 75.0 in | Wt 225.0 lb

## 2024-01-10 DIAGNOSIS — Z0000 Encounter for general adult medical examination without abnormal findings: Secondary | ICD-10-CM

## 2024-01-10 DIAGNOSIS — K4090 Unilateral inguinal hernia, without obstruction or gangrene, not specified as recurrent: Secondary | ICD-10-CM | POA: Diagnosis not present

## 2024-01-10 DIAGNOSIS — Z7982 Long term (current) use of aspirin: Secondary | ICD-10-CM | POA: Diagnosis not present

## 2024-01-10 DIAGNOSIS — Z794 Long term (current) use of insulin: Secondary | ICD-10-CM | POA: Diagnosis not present

## 2024-01-10 DIAGNOSIS — K469 Unspecified abdominal hernia without obstruction or gangrene: Secondary | ICD-10-CM | POA: Diagnosis present

## 2024-01-10 MED ORDER — POLYETHYLENE GLYCOL 3350 17 G PO PACK
17.0000 g | PACK | Freq: Every day | ORAL | 0 refills | Status: AC | PRN
  Filled 2024-01-10: qty 14, 14d supply, fill #0

## 2024-01-10 NOTE — Discharge Instructions (Addendum)
 Call to make an appointment with surgery. If you begin having pain and the hernia does not reduce you should return to the ER for re-eval. Take miralax  1-3 times daily to have soft poop.

## 2024-01-10 NOTE — ED Triage Notes (Signed)
 Denies urinary issues

## 2024-01-10 NOTE — Progress Notes (Addendum)
 Because this visit was a virtual/telehealth visit,  certain criteria was not obtained, such a blood pressure, CBG if applicable, and timed get up and go. Any medications not marked as taking were not mentioned during the medication reconciliation part of the visit. Any vitals not documented were not able to be obtained due to this being a telehealth visit or patient was unable to self-report a recent blood pressure reading due to a lack of equipment at home via telehealth. Vitals that have been documented are verbally provided by the patient.   Subjective:   Joseph [name] is a 78 y.o. who presents for a Medicare Wellness preventive visit.  As a reminder, Annual Wellness Visits don't include a physical exam, and some assessments may be limited, especially if this visit is performed virtually. We may recommend an in-person follow-up visit with your provider if needed.  Visit Complete: Virtual I connected with  Joseph [name] on 01/10/24 by a audio enabled telemedicine application and verified that I am speaking with the correct person using two identifiers.  Patient Location: Home  Provider Location: Home Office  I discussed the limitations of evaluation and management by telemedicine. The patient expressed understanding and agreed to proceed.  Vital Signs: Because this visit was a virtual/telehealth visit, some criteria may be missing or patient reported. Any vitals not documented were not able to be obtained and vitals that have been documented are patient reported.  VideoDeclined- This patient declined Librarian, academic. Therefore the visit was completed with audio only.  Persons Participating in Visit: Patient.  AWV Questionnaire: No: Patient Medicare AWV questionnaire was not completed prior to this visit.  Cardiac Risk Factors include: advanced age (>65men, >50 women);diabetes mellitus;dyslipidemia;hypertension;male gender     Objective:    Today's Vitals    01/10/24 1425  Weight: 225 lb (102.1 kg)  Height: 6' 3 (1.905 m)  PainSc: 0-No pain   Body mass index is 28.12 kg/m.     01/10/2024    2:26 PM 01/04/2023    2:34 PM 02/04/2021    6:29 PM 01/30/2021    4:30 PM 01/26/2021    8:06 AM 11/03/2020   10:41 AM 10/20/2020    2:30 PM  Advanced Directives  Does Patient Have a Medical Advance Directive? No No No Yes No No No  Type of Advance Directive   Living will Living will     Does patient want to make changes to medical advance directive?   Yes (Inpatient - patient requests chaplain consult to change a medical advance directive) No - Patient declined     Would patient like information on creating a medical advance directive? No - Patient declined Yes (MAU/Ambulatory/Procedural Areas - Information given) Yes (Inpatient - patient requests chaplain consult to create a medical advance directive)  No - Guardian declined Yes (MAU/Ambulatory/Procedural Areas - Information given) No - Patient declined    Current Medications (verified) Outpatient Encounter Medications as of 01/10/2024  Medication Sig   amLODipine  (NORVASC ) 10 MG tablet Take 1 tablet (10 mg total) by mouth daily. (Please keep upcoming appointment for additional refills)   aspirin  EC 81 MG EC tablet Take 1 tablet (81 mg total) by mouth daily. Swallow whole.   atorvastatin  (LIPITOR) 10 MG tablet Take 1 tablet (10 mg total) by mouth daily. In the evening   Blood Glucose Monitoring Suppl (ACCU-CHEK GUIDE) w/Device KIT Check blood sugar three times daily.   Blood Glucose Monitoring Suppl (TRUE METRIX METER) w/Device KIT Use to check  blood sugar 3 times daily.   dapagliflozin  propanediol (FARXIGA ) 10 MG TABS tablet Take 1 tablet (10 mg total) by mouth daily before breakfast.   ferrous sulfate  (FEROSUL) 325 (65 FE) MG tablet Take 1 tablet (325 mg total) by mouth daily with breakfast.   glimepiride  (AMARYL ) 4 MG tablet Take 1 tablet (4 mg total) by mouth every morning AND 0.5 tablets (2 mg total)  every evening.   glucose blood (TRUE METRIX BLOOD GLUCOSE TEST) test strip Use to check blood sugar 3 times daily.   Insulin  Glargine (BASAGLAR  KWIKPEN) 100 UNIT/ML Inject 20 Units into the skin daily.   Insulin  Pen Needle (INSUPEN PEN NEEDLES) 32G X 4 MM MISC Use 1 pen tip to inject insulin  once daily.   Misc. Devices MISC leg extension and foot rest for the left side.   Multiple Vitamins-Minerals (ONE-A-DAY MENS 50+ ADVANTAGE) TABS Take 1 tablet by mouth daily with breakfast.   TRUEplus Lancets 28G MISC Use to check blood sugar 3 times daily.   [DISCONTINUED] polyethylene glycol (MIRALAX  / GLYCOLAX ) 17 g packet Take 17 g by mouth daily as needed for mild constipation.   No facility-administered encounter medications on file as of 01/10/2024.    Allergies (verified) Other   History: Past Medical History:  Diagnosis Date   Cellulitis of right lower extremity    Diabetes mellitus type 2 in nonobese [facility])    Gangrene of right foot (HCC)    Hypertension    Open wound of right foot    Past Surgical History:  Procedure Laterality Date   ABDOMINAL AORTOGRAM W/LOWER EXTREMITY Left 01/27/2021   Procedure: ABDOMINAL AORTOGRAM W/LOWER EXTREMITY;  Surgeon: Magda Debby SAILOR, MD;  Location: [facility] INVASIVE CV LAB;  Service: Cardiovascular;  Laterality: Left;   AMPUTATION Right 11/16/2018   Procedure: RIGHT BELOW KNEE AMPUTATION;  Surgeon: Harden Jerona GAILS, MD;  Location: [facility] OR;  Service: Orthopedics;  Laterality: Right;   AMPUTATION Left 02/03/2021   Procedure: LEFT BELOW KNEE AMPUTATION;  Surgeon: Harden Jerona GAILS, MD;  Location: [facility] OR;  Service: Orthopedics;  Laterality: Left;   APPLICATION OF WOUND VAC  02/03/2021   Procedure: APPLICATION OF WOUND VAC;  Surgeon: Harden Jerona GAILS, MD;  Location: [facility] OR;  Service: Orthopedics;;   HAND SURGERY     Family History  Problem Relation Age of Onset   Diabetes Father    Cancer Neg Hx    CAD Neg Hx    Social History   Socioeconomic History   Marital status:  Divorced    Spouse name: Not on file   Number of children: Not on file   Years of education: Not on file   Highest education level: Not on file  Occupational History   Occupation: retired     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  Tobacco Use   Smoking status: Never   Smokeless tobacco: Never  Vaping Use   Vaping status: Never Used  Substance and Sexual Activity   Alcohol use: Not Currently   Drug use: No   Sexual activity: Not on file  Other Topics Concern   Not on file  Social History Narrative   Lives alone, 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   On fixed Social security income   Has a daughter who is a Lawyer and son does not speak with them every day   Most reliable family members reported to live in [place]   Divorced but remains friends with his ex wife, Rosaline Molt  Has advance directives but has not as 11/03/20 [facility] a copy [facility]   Goes to church      Social Drivers of Health   Financial Resource Strain: Low Risk  (01/10/2024)   Overall Financial Resource Strain (CARDIA)    Difficulty of Paying Living Expenses: Not very hard  Food Insecurity: No Food Insecurity (01/10/2024)   Hunger Vital Sign    Worried About Running Out of Food in the Last Year: Never true    Ran Out of Food in the Last Year: Never true  Transportation Needs: No Transportation Needs (01/10/2024)   PRAPARE - Administrator, Civil Service (Medical): No    Lack of Transportation (Non-Medical): No  Physical Activity: Sufficiently Active (01/10/2024)   Exercise Vital Sign    Days of Exercise per Week: 5 days    Minutes of Exercise per Session: 40 min  Stress: No Stress Concern Present (01/10/2024)   [employer] of Occupational Health - Occupational Stress Questionnaire    Feeling of Stress: Not at all  Social Connections: Moderately Isolated (01/10/2024)   Social Connection and Isolation Panel    Frequency of Communication with Friends and Family: Once a week     Frequency of Social Gatherings with Friends and Family: Twice a week    Attends Religious Services: More than 4 times per year    Active Member of [employer] or Organizations: No    Attends Banker Meetings: Never    Marital Status: Widowed    Tobacco Counseling Counseling given: Not Answered    Clinical Intake:  Pre-visit preparation completed: Yes  Pain : No/denies pain Pain Score: 0-No pain     BMI - recorded: 28.12 Nutritional Status: BMI 25 -29 Overweight Nutritional Risks: None Diabetes: Yes CBG done?: No Did pt. bring in CBG monitor from home?: No  Lab Results  Component Value Date   HGBA1C 7.7 (A) 10/18/2023   HGBA1C 8.0 (A) 07/21/2023   HGBA1C 10.0 (H) 07/29/2022     How often do you need to have someone help you when you read instructions, pamphlets, or other written materials from your doctor or pharmacy?: 1 - Never  Interpreter Needed?: No  Information entered by :: Sandy Blouch N. Margaretmary Prisk, LPN.   Activities of Daily Living     01/10/2024    2:29 PM  In your present state of health, do you have any difficulty performing the following activities:  Hearing? 0  Vision? 0  Difficulty concentrating or making decisions? 0  Walking or climbing stairs? 1  Comment CANE  Dressing or bathing? 0  Doing errands, shopping? 0  Preparing Food and eating ? N  Using the Toilet? N  In the past six months, have you accidently leaked urine? N  Do you have problems with loss of bowel control? N  Managing your Medications? N  Managing your Finances? N  Housekeeping or managing your Housekeeping? N    Patient Care Team: Delbert Clam, MD as PCP - General (Family Medicine) Okey Vina GAILS, MD as PCP - Cardiology (Cardiology) Silva Juliene SAUNDERS, DPM as Consulting Physician (Podiatry) Harden Jerona GAILS, MD as Consulting Physician (Orthopedic Surgery) Robinson Idol, MD as Consulting Physician (Ophthalmology)  I have updated your Care Teams any recent Medical  Services you may have received from other providers in the past year.     Assessment:   This is a routine wellness examination for Joseph.  Hearing/Vision screen Hearing Screening - Comments:: Denies hearing difficulties.  Vision Screening - Comments:: Wears rx glasses - not up to date with routine eye exams.    Goals Addressed             This Visit's Progress    01/10/24: Continue to eat healthy and eat more fresh vegetables and fruits.         Depression Screen     01/10/2024    2:31 PM 10/18/2023    2:04 PM 07/21/2023    4:22 PM 01/04/2023    2:33 PM 09/08/2022   11:22 AM 09/02/2022   12:31 PM 03/22/2022   11:39 AM  PHQ 2/9 Scores  PHQ - 2 Score 0 0 0 0 0 0 0  PHQ- 9 Score 0 0 0  0  0    Fall Risk     01/10/2024    2:27 PM 01/04/2023    2:35 PM 09/08/2022   11:22 AM 03/22/2022   11:40 AM 07/05/2021    2:11 PM  Fall Risk   Falls in the past year? 0 0 0 0 0  Number falls in past yr: 0 0 0 0 0  Injury with Fall? 0 0 0 0 0  Risk for fall due to : No Fall Risks Impaired mobility;Impaired balance/gait No Fall Risks No Fall Risks   Follow up Falls evaluation completed Falls prevention discussed;Education provided;Falls evaluation completed  Falls evaluation completed  Falls evaluation completed      Data saved with a previous flowsheet row definition    MEDICARE RISK AT HOME:  Medicare Risk at Home Any stairs in or around the home?: Yes (handicap ramps at the bakc & front) If so, are there any without handrails?: No Home free of loose throw rugs in walkways, pet beds, electrical cords, etc?: Yes Adequate lighting in your home to reduce risk of falls?: Yes Life alert?: No Use of a cane, walker or w/c?: Yes (CANE) Grab bars in the bathroom?: Yes Shower chair or bench in shower?: Yes Elevated toilet seat or a handicapped toilet?: Yes  TIMED UP AND GO:  Was the test performed?  No  Cognitive Function: Declined/Normal: No cognitive concerns noted by patient or family.  Patient alert, oriented, able to answer questions appropriately and recall recent events. No signs of memory loss or confusion.    01/10/2024    2:29 PM  MMSE - Mini Mental State Exam  Not completed: Unable to complete        01/10/2024    2:29 PM 01/04/2023    2:35 PM  6CIT Screen  What Year? 0 points 0 points  What month? 0 points 0 points  What time? 0 points 0 points  Count back from 20 0 points 0 points  Months in reverse 0 points 0 points  Repeat phrase 0 points 0 points  Total Score 0 points 0 points    Immunizations Immunization History  Administered Date(s) Administered   Influenza,inj,Quad PF,6+ Mos 02/12/2013   Tdap 03/22/2022    Screening Tests Health Maintenance  Topic Date Due   OPHTHALMOLOGY EXAM  07/08/2022   Zoster Vaccines- Shingrix (1 of 2) 01/18/2024 (Originally 11/09/1997)   Pneumococcal Vaccine: 50+ Years (1 of 2 - PCV) 10/17/2024 (Originally 11/10/1964)   HEMOGLOBIN A1C  04/19/2024   Diabetic kidney evaluation - eGFR measurement  07/20/2024   Diabetic kidney evaluation - Urine ACR  07/20/2024   Medicare Annual Wellness (AWV)  01/09/2025   DTaP/Tdap/Td (2 - Td or Tdap) 03/22/2032   Hepatitis C Screening  Completed   Hepatitis B Vaccines  Aged Out   HPV VACCINES  Aged Out   Meningococcal B Vaccine  Aged Out   FOOT EXAM  Discontinued   COVID-19 Vaccine  Discontinued    Health Maintenance  Health Maintenance Due  Topic Date Due   OPHTHALMOLOGY EXAM  07/08/2022   Health Maintenance Items Addressed: Yes Patient is aware of current care gaps.  Patient declined all vaccines and will schedule an eye appointment.  Additional Screening:  Vision Screening: Recommended annual ophthalmology exams for early detection of glaucoma and other disorders of the eye. Would you like a referral to an eye doctor? No    Dental Screening: Recommended annual dental exams for proper oral hygiene  Community Resource Referral / Chronic Care Management: CRR required  this visit?  No   CCM required this visit?  No   Plan:    I have personally reviewed and noted the following in the patient's chart:   Medical and social history Use of alcohol, tobacco or illicit drugs  Current medications and supplements including opioid prescriptions. Patient is not currently taking opioid prescriptions. Functional ability and status Nutritional status Physical activity Advanced directives List of other physicians Hospitalizations, surgeries, and ER visits in previous 12 months Vitals Screenings to include cognitive, depression, and falls Referrals and appointments  In addition, I have reviewed and discussed with patient certain preventive protocols, quality metrics, and best practice recommendations. A written personalized care plan for preventive services as well as general preventive health recommendations were provided to patient.   Roz LOISE Fuller, LPN   1/86/7974   After Visit Summary: (MyChart) Due to this being a telephonic visit, the after visit summary with patients personalized plan was offered to patient via MyChart   Notes: Patient is aware of current care gaps.  Patient declined all vaccines and will schedule an eye appointment.

## 2024-01-10 NOTE — ED Triage Notes (Signed)
 Pt bib POV c/o right groin hernia. Pt says he noticed it seven days ago. Pt states it has grown in size and feels more pressure.

## 2024-01-10 NOTE — Patient Instructions (Signed)
 Joseph [name] , Thank you for taking time out of your busy schedule to complete your Annual Wellness Visit with me. I enjoyed our conversation and look forward to speaking with you again next year. I, as well as your care team,  appreciate your ongoing commitment to your health goals. Please review the following plan we discussed and let me know if I can assist you in the future. Your Game plan/ To Do List    Referrals: If you haven't heard from the office you've been referred to, please reach out to them at the phone provided.   Follow up Visits: We will see or speak with you next year for your Next Medicare AWV with our clinical staff Have you seen your provider in the last 6 months (3 months if uncontrolled diabetes)? Yes  Clinician Recommendations:  Aim for 30 minutes of exercise or brisk walking, 6-8 glasses of water, and 5 servings of fruits and vegetables each day.       This is a list of the screenings recommended for you:  Health Maintenance  Topic Date Due   Eye exam for diabetics  07/08/2022   Zoster (Shingles) Vaccine (1 of 2) 01/18/2024*   Pneumococcal Vaccine for age over 42 (1 of 2 - PCV) 10/17/2024*   Hemoglobin A1C  04/19/2024   Yearly kidney function blood test for diabetes  07/20/2024   Yearly kidney health urinalysis for diabetes  07/20/2024   Medicare Annual Wellness Visit  01/09/2025   DTaP/Tdap/Td vaccine (2 - Td or Tdap) 03/22/2032   Hepatitis C Screening  Completed   Hepatitis B Vaccine  Aged Out   HPV Vaccine  Aged Out   Meningitis B Vaccine  Aged Out   Complete foot exam   Discontinued   COVID-19 Vaccine  Discontinued  *Topic was postponed. The date shown is not the original due date.    Advanced directives: (Declined) Advance directive discussed with you today. Even though you declined this today, please call our office should you change your mind, and we can give you the proper paperwork for you to fill out. Advance Care Planning is important because  it:  [x]  Makes sure you receive the medical care that is consistent with your values, goals, and preferences  [x]  It provides guidance to your family and loved ones and reduces their decisional burden about whether or not they are making the right decisions based on your wishes.  Follow the link provided in your after visit summary or read over the paperwork we have mailed to you to help you started getting your Advance Directives in place. If you need assistance in completing these, please reach out to us  so that we can help you!  See attachments for Preventive Care and Fall Prevention Tips.

## 2024-01-10 NOTE — ED Provider Notes (Signed)
 [place] EMERGENCY DEPARTMENT AT [facility] Provider Note   CSN: [id number] Arrival date & time: 01/10/24  1157     Patient presents with: Hernia   Joseph [name] is a 78 y.o. male.   HPI  Patient indicates that he has had a lump in his right groin that has been present for at least several weeks.  He denies any significant pain in this area does state that it has grown in size over the past 7 days he has noticed it is larger.  He does strain to poop occasionally.  He states that a month ago the lump was smaller and would disappear completely when he lay down and went to sleep at night.  He states more recently while it does completely go away at night over the course of the day it will be larger than it has historically been.  He is eating and drinking normally peeing and pooping without difficulty.  No blood in his stool.  No history of hernia surgery in the past.       Prior to Admission medications   Medication Sig Start Date End Date Taking? Authorizing Provider  amLODipine  (NORVASC ) 10 MG tablet Take 1 tablet (10 mg total) by mouth daily. (Please keep upcoming appointment for additional refills) 10/18/23   Newlin, Enobong, MD  aspirin  EC 81 MG EC tablet Take 1 tablet (81 mg total) by mouth daily. Swallow whole. 02/15/21   Angiulli, Toribio PARAS, PA-C  atorvastatin  (LIPITOR) 10 MG tablet Take 1 tablet (10 mg total) by mouth daily. In the evening 10/18/23   Delbert Clam, MD  Blood Glucose Monitoring Suppl (ACCU-CHEK GUIDE) w/Device KIT Check blood sugar three times daily. 09/02/22   Newlin, Enobong, MD  Blood Glucose Monitoring Suppl (TRUE METRIX METER) w/Device KIT Use to check blood sugar 3 times daily. 10/25/23   Newlin, Enobong, MD  dapagliflozin  propanediol (FARXIGA ) 10 MG TABS tablet Take 1 tablet (10 mg total) by mouth daily before breakfast. 10/18/23   Newlin, Enobong, MD  ferrous sulfate  (FEROSUL) 325 (65 FE) MG tablet Take 1 tablet (325 mg total) by mouth daily with  breakfast. 10/18/23   Vicci Barnie NOVAK, MD  glimepiride  (AMARYL ) 4 MG tablet Take 1 tablet (4 mg total) by mouth every morning AND 0.5 tablets (2 mg total) every evening. 10/18/23   Newlin, Enobong, MD  glucose blood (TRUE METRIX BLOOD GLUCOSE TEST) test strip Use to check blood sugar 3 times daily. 10/25/23   Newlin, Enobong, MD  Insulin  Glargine (BASAGLAR  KWIKPEN) 100 UNIT/ML Inject 20 Units into the skin daily. 10/18/23   Newlin, Enobong, MD  Insulin  Pen Needle (INSUPEN PEN NEEDLES) 32G X 4 MM MISC Use 1 pen tip to inject insulin  once daily. 11/22/23   Newlin, Enobong, MD  Misc. Devices MISC leg extension and foot rest for the left side. 02/25/21   Newlin, Enobong, MD  Multiple Vitamins-Minerals (ONE-A-DAY MENS 50+ ADVANTAGE) TABS Take 1 tablet by mouth daily with breakfast.    [provider]  polyethylene glycol (MIRALAX  / GLYCOLAX ) 17 g packet Take 17 g by mouth daily as needed for mild constipation. 01/10/24   Neldon Hamp RAMAN, PA  TRUEplus Lancets 28G MISC Use to check blood sugar 3 times daily. 10/25/23   Newlin, Enobong, MD    Allergies: Other    Review of Systems  Updated Vital Signs BP (!) 154/78 (BP Location: Right Arm)   Pulse 63   Temp 97.9 F (36.6 C)   Resp 18  Ht 6' 3 (1.905 m)   Wt 102.1 kg   SpO2 96%   BMI 28.12 kg/m   Physical Exam Vitals and nursing note reviewed.  Constitutional:      General: He is not in acute distress.    Comments: Pleasant well-appearing 78 year old.  In no acute distress.  Sitting comfortably in bed.  Able answer questions appropriately follow commands. No increased work of breathing. Speaking in full sentences.   HENT:     Head: Normocephalic and atraumatic.     Nose: Nose normal.     Mouth/Throat:     Mouth: Mucous membranes are moist.  Eyes:     General: No scleral icterus. Cardiovascular:     Rate and Rhythm: Normal rate and regular rhythm.     Pulses: Normal pulses.     Heart sounds: Normal heart sounds.  Pulmonary:      Effort: Pulmonary effort is normal. No respiratory distress.     Breath sounds: No wheezing.  Abdominal:     Palpations: Abdomen is soft.     Tenderness: There is no abdominal tenderness.     Hernia: A hernia is present.     Comments: Soft easily reducible right inguinal hernia  Musculoskeletal:     Cervical back: Normal range of motion.     Right lower leg: No edema.     Left lower leg: No edema.     Comments: Bilateral BKA  Skin:    General: Skin is warm and dry.     Capillary Refill: Capillary refill takes less than 2 seconds.  Neurological:     Mental Status: He is alert. Mental status is at baseline.  Psychiatric:        Mood and Affect: Mood normal.        Behavior: Behavior normal.     (all labs ordered are listed, but only abnormal results are displayed) Labs Reviewed - No data to display  EKG: None  Radiology: No results found.   Procedures   Medications Ordered in the ED - No data to display                                  Medical Decision Making Risk OTC drugs.    Patient indicates that he has had a lump in his right groin that has been present for at least several weeks.  He denies any significant pain in this area does state that it has grown in size over the past 7 days he has noticed it is larger.  He does strain to poop occasionally.  He states that a month ago the lump was smaller and would disappear completely when he lay down and went to sleep at night.  He states more recently while it does completely go away at night over the course of the day it will be larger than it has historically been.  He is eating and drinking normally peeing and pooping without difficulty.  No blood in his stool.  No history of hernia surgery in the past.   Patient with soft, easily reducible inguinal hernia right sided  No evidence of obstruction.  Will discharge with follow-up with general surgery.  Recommended MiraLAX  and recommend no heavy lifting or straining.   Patient agreeable to plan.     Final diagnoses:  Inguinal hernia without obstruction or gangrene, recurrence not specified, unspecified laterality    ED Discharge Orders  Ordered    polyethylene glycol (MIRALAX  / GLYCOLAX ) 17 g packet  Daily PRN        01/10/24 1321               Neldon Hamp RAMAN, [place] 01/10/24 1815    Neysa Caron PARAS, DO 01/11/24 [phone]

## 2024-01-15 ENCOUNTER — Other Ambulatory Visit: Payer: Self-pay

## 2024-01-15 ENCOUNTER — Ambulatory Visit: Payer: Self-pay | Admitting: Surgery

## 2024-01-15 DIAGNOSIS — Z794 Long term (current) use of insulin: Secondary | ICD-10-CM | POA: Diagnosis not present

## 2024-01-15 DIAGNOSIS — K4090 Unilateral inguinal hernia, without obstruction or gangrene, not specified as recurrent: Secondary | ICD-10-CM | POA: Diagnosis not present

## 2024-01-15 DIAGNOSIS — E119 Type 2 diabetes mellitus without complications: Secondary | ICD-10-CM | POA: Diagnosis not present

## 2024-01-15 NOTE — H&P (View-Only) (Signed)
 PATIENT NAME: Joseph [name] MRN: [id number] DOB: 09-24-1945 PHYSICIANS:  REFERRING PHYSICIAN:  Room, Emergency  CARE TEAM:   Patient Care Team: Amao, Enobong, MD as PCP - General (Family Medicine)  CONSULTING PROVIDER:  ELSPETH JUDAH SCHULTZE, MD     SUBJECTIVE   Chief Complaint: New Patient (New patient eval rih)    Joseph [name] is a 78 y.o. male  who is seen today as an office consultation  at the request of PA.Hamp GORMAN Bow for evaluation of right groin swelling.  Probable hernia  History of Present Illness:  78 year old male.  History of significant severe peripheral vascular disease & DM.  Had an A1c as high as 10.  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  Rather active and works at home.  Followed by Dr. Newlin with more recent A1c down to 7.7.  On oral hypoglycemics as well as Lantus  insulin  chronic kidney disease and hypertension.  Noticed some right groin swelling that concerned him.  He went to the emergency department last week.  Inguinal hernia suspected.  Surgical consultation offered.  Patient recalls having some groin discomfort and swelling for many years.  Discussed with the doctor in the distant past.  Reassured him to keep an eye on it.  However this year he feels like its gotten larger.  More sensitive.  He moves his bowels about every 2 or 3 days.  No nocturia.  No prostate issues.  He denies any history of any abdominal pelvic or hernia surgery.  No history of UTIs or infections.  No major healing issues on his prostheses.     Medical History:  Past Medical History:  Diagnosis Date  . Diabetes mellitus without complication (CMS/HHS-HCC)   . Hypertension     Patient Active Problem List  Diagnosis  . Right inguinal hernia  . Insulin -requiring or dependent type II diabetes mellitus (CMS/HHS-HCC)    History reviewed. No pertinent surgical history.   No Known Allergies  Current Outpatient Medications on File Prior to  Visit  Medication Sig Dispense Refill  . amLODIPine  (NORVASC ) 10 MG tablet Take 10 mg by mouth once daily    . aspirin  81 MG EC tablet Take 81 mg by mouth once daily    . atorvastatin  (LIPITOR) 10 MG tablet Take 10 mg by mouth once daily    . ferrous sulfate  325 (65 FE) MG tablet Take 325 mg by mouth daily with breakfast    . glimepiride  (AMARYL ) 4 MG tablet Take 4 mg by mouth daily with breakfast    . insulin  GLARGINE (LANTUS  SOLOSTAR) pen injector (concentration 100 units/mL) Inject subcutaneously at bedtime     No current facility-administered medications on file prior to visit.    History reviewed. No pertinent family history.   Social History   Tobacco Use  Smoking Status Never  Smokeless Tobacco Never     Social History   Socioeconomic History  . Marital status: Divorced  Tobacco Use  . Smoking status: Never  . Smokeless tobacco: Never  Substance and Sexual Activity  . Drug use: Never   Social Drivers of Corporate investment banker Strain: Low Risk  (01/10/2024)   Received [facility]   Overall Financial Resource Strain (CARDIA)   . How hard is it for you to pay for the very basics like food, housing, medical care, and heating?: Not very hard  Food Insecurity: No Food Insecurity (01/10/2024)   Received from [facility]   Hunger Vital Sign   .  Within the past 12 months, you worried that your food would run out before you got the money to buy more.: Never true   . Within the past 12 months, the food you bought just didn't last and you didn't have money to get more.: Never true  Transportation Needs: No Transportation Needs (01/10/2024)   Received from [facility] - Transportation   . In the past 12 months, has lack of transportation kept you from medical appointments or from getting medications?: No   . In the past 12 months, has lack of transportation kept you from meetings, work, or from getting things needed for daily living?: No  Physical Activity:  Sufficiently Active (01/10/2024)   Received from [facility]   Exercise Vital Sign   . On average, how many days per week do you engage in moderate to strenuous exercise (like a brisk walk)?: 5 days   . On average, how many minutes do you engage in exercise at this level?: 40 min  Stress: No Stress Concern Present (01/10/2024)   Received from [facility] - Occupational Stress Questionnaire   . Do you feel stress - tense, restless, nervous, or anxious, or unable to sleep at night because your mind is troubled all the time - these days?: Not at all  Social Connections: Moderately Isolated (01/10/2024)   Received from [facility]   Social Connection and Isolation Panel   . In a typical week, how many times do you talk on the phone with family, friends, or neighbors?: Once a week   . How often do you get together with friends or relatives?: Twice a week   . How often do you attend church or religious services?: More than 4 times per year   . Do you belong to any clubs or organizations such as church groups, unions, fraternal or athletic groups, or school groups?: No   . How often do you attend meetings of the clubs or organizations you belong to?: Never   . Are you married, widowed, divorced, separated, never married, or living with a partner?: Widowed  Housing Stability: Unknown (01/15/2024)   Housing Stability Vital Sign   . Homeless in the Last Year: No    ############################################################  Review of Systems: A complete review of systems (ROS) was obtained from the patient.   We have reviewed this information and discussed as appropriate with the patient.   See HPI as well for other pertinent ROS.  Constitutional:  No fevers, chills, sweats.  Weight stable Eyes:  No vision changes, No discharge HENT:  No sore throats, nasal drainage Lymph: No neck swelling, No bruising easily Pulmonary:  No cough, productive  sputum CV: No orthopnea, PND . No exertional chest/neck/shoulder/arm pain.  Patient can walk 10 minutes gradually.  Retired but works in the yard pretty regularly  GI:  No personal nor family history of GI/colon cancer, inflammatory bowel disease, irritable bowel syndrome, allergy such as Celiac Sprue, dietary/dairy problems, colitis, ulcers nor gastritis.  No recent sick contacts/gastroenteritis.  No travel outside the country.  No changes in diet.  Renal: No UTIs, No hematuria Genital:  No drainage, bleeding, masses Musculoskeletal: No severe joint pain.  Good ROM major joints Skin:  No sores or lesions Heme/Lymph:  No easy bleeding.  No swollen lymph nodes Neuro:  No active seizures.  No facial droop Psych:  No hallucinations.  No agitation  OBJECTIVE   Vitals:   01/15/24 1449  BP: 132/80  Pulse: 78  Temp: 36.7 C (98 F)  SpO2: 99%  Weight: (!) 102.8 kg (226 lb 9.6 oz)  Height: 190.5 cm (6' 3)  PainSc: 0-No pain    Body mass index is 28.32 kg/m.  PHYSICAL EXAM:   Constitutional: Not cachectic.  Hygeine adequate.  Vitals signs as above.   Eyes: No glasses.  Vision adequate,Pupils reactive, normal extraocular movements. Sclera nonicteric Neuro: CN II-XII intact.  No major focal sensory defects.  No major motor deficits. Lymph: No head/neck/groin lymphadenopathy Psych:  No severe agitation.  No severe anxiety.  Judgment & insight Adequate, Oriented x4, HENT: Normocephalic, Mucus membranes moist.  No thrush.  Hearing: adequate Neck: Supple, No tracheal deviation.  No obvious thyromegaly Chest: No pain to chest wall compression.  Good respiratory excursion.  No audible wheezing CV:  Pulses intact.  regular.  No major extremity edema Ext: Bilateral below the knee prostheses in place.  Ambulating without any guarding.  Edema: Not present.  No cyanosis Skin: No major subcutaneous nodules.  Warm and dry Musculoskeletal: Severe joint rigidity not present.  No obvious clubbing.   No digital petechiae.  Mobility: no assist device moving easily without restrictions  Abdomen:  Flat Soft.   Nondistended.  Nontender.    Hernia: Not present. Diastasis recti: Not present.  No hepatomegaly.  No splenomegaly.  Genital/Pelvic:  Inguinal hernia: Moderate size right groin bulging very sensitive but reducible hernia.  Questional impulse in left groin.  No external male genitalia with testicles mildly enlarged but symmetrical..  Inguinal lymph nodes: without lymphadenopathy nor hidradenitis.    Rectal: (Deferred)    ###################################################################  Labs, Imaging and Diagnostic Testing:  Located in 'Care Everywhere' section of Epic EMR chart   PRIOR [facility] CLINIC NOTES:  Not applicable   SURGERY NOTES:  Not applicable   PATHOLOGY:  Not applicable    Assessment and Plan:  DIAGNOSES:  Diagnoses and all orders for this visit:  Right inguinal hernia  Insulin -requiring or dependent type II diabetes mellitus (CMS/HHS-HCC)     ASSESSMENT/PLAN  Pleasant retired male with right groin bulging and moderate size inguinal hernia sensitive.  Perhaps small contralateral limb as well.  Standard of care to consider hernia repair.  Reasonable minimally invasive laparoscopic approach.The anatomy & physiology of the abdominal wall and pelvic floor was discussed.  The pathophysiology of hernias in the inguinal and pelvic region was discussed.  Natural history risks such as progressive enlargement, pain, incarceration, and strangulation was discussed.   Contributors to complications such as smoking, obesity, diabetes, prior surgery, etc were discussed.    I feel the risks of no intervention will lead to serious problems that outweigh the operative risks; therefore, I recommended surgery to reduce and repair the hernia.  I explained laparoscopic techniques with possible need for an open approach.  I noted usual use of mesh to patch and/or buttress  hernia repair  Risks such as bleeding, infection, abscess, need for further treatment, injury to other organs, need for repair of tissues / organs, stroke, heart attack, death, and other risks were discussed.  I noted a good likelihood this will help address the problem.   Goals of post-operative recovery were discussed as well.  Possibility that this will not correct all symptoms was explained.  I stressed the importance of low-impact activity, aggressive pain control, avoiding constipation, & not pushing through pain to minimize risk of post-operative chronic pain or injury. Possibility of reherniation was discussed.  We will work to minimize  complications.     An educational handout further explaining the pathology & treatment options was given as well.  Questions were answered.  The patient expresses understanding & wishes to proceed with surgery.  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  However he got on insulin  with much better control but this year his A1c is 7.7  He has not had any healing or wound issues or urinary tract infections.  Hopefully with better control his risks of infection or hernia recurrence has been minimized.  Despite his bilateral below the knee prostheses, he has decent performance status and is rather functional independent.  Had echocardiogram 2020 showing ejection fraction 50-55%.  Does not smoke.  Do not think he needs cardiac clearance in this low risk surgery.  No perioperative issues with his amputations in 2020 and 2022   FOLLOWUP:  Return for WE RECOMMEND SURGERY - See instructions.  I have reviewed this patient's available data, including medical history, doctor notes, radiology & other studies, events of note, test results, physical exam, etc as part of my evaluation.   A significant portion of that time was spent in counseling in discussion of management, need for  further testing, need for other medical or surgical input, etc.   Care was provided by me.  Based on my assessment, this care required  level of medical decision making.  01/15/2024   The plan was discussed in detail with the patient today, who expressed understanding & appreciation.  The patient has my contact information, and understands to call me with any additional questions or concerns.  I & my group would be happy to see the patient back sooner if the need arises.  Of note, portions of this report may have been transcribed using voice recognition software. Effort was made to ensure accuracy; however, inadvertent computerized transcription errors with sound-a-like substitutions may be present.   Any transcriptional errors that result from this process are unintentional.  ########################################################   Elspeth KYM Schultze, MD, FACS, MASCRS Esophageal, Gastrointestinal & Colorectal Surgery Robotic and Minimally Invasive Surgery  [facility]  [address] [place], [place] [address] [phone] Fax [phone] Main              01/15/2024

## 2024-01-17 ENCOUNTER — Other Ambulatory Visit: Payer: Self-pay

## 2024-01-22 ENCOUNTER — Other Ambulatory Visit: Payer: Self-pay

## 2024-01-26 ENCOUNTER — Other Ambulatory Visit: Payer: Self-pay

## 2024-01-26 ENCOUNTER — Encounter (HOSPITAL_COMMUNITY): Payer: Self-pay

## 2024-01-26 ENCOUNTER — Encounter (HOSPITAL_COMMUNITY)
Admission: RE | Admit: 2024-01-26 | Discharge: 2024-01-26 | Disposition: A | Discharge status: Home / Self Care | Source: Ambulatory Visit | Attending: Surgery | Admitting: Surgery

## 2024-01-26 VITALS — BP 134/80 | HR 71 | Temp 98.2°F | Resp 16 | Ht 75.0 in

## 2024-01-26 DIAGNOSIS — I130 Hypertensive heart and chronic kidney disease with heart failure and stage 1 through stage 4 chronic kidney disease, or unspecified chronic kidney disease: Secondary | ICD-10-CM | POA: Diagnosis not present

## 2024-01-26 DIAGNOSIS — E1151 Type 2 diabetes mellitus with diabetic peripheral angiopathy without gangrene: Secondary | ICD-10-CM | POA: Diagnosis not present

## 2024-01-26 DIAGNOSIS — E1165 Type 2 diabetes mellitus with hyperglycemia: Secondary | ICD-10-CM | POA: Insufficient documentation

## 2024-01-26 DIAGNOSIS — Z89511 Acquired absence of right leg below knee: Secondary | ICD-10-CM | POA: Insufficient documentation

## 2024-01-26 DIAGNOSIS — Z01818 Encounter for other preprocedural examination: Secondary | ICD-10-CM | POA: Diagnosis not present

## 2024-01-26 DIAGNOSIS — I5032 Chronic diastolic (congestive) heart failure: Secondary | ICD-10-CM | POA: Diagnosis not present

## 2024-01-26 DIAGNOSIS — E1122 Type 2 diabetes mellitus with diabetic chronic kidney disease: Secondary | ICD-10-CM | POA: Insufficient documentation

## 2024-01-26 DIAGNOSIS — Z89512 Acquired absence of left leg below knee: Secondary | ICD-10-CM | POA: Diagnosis not present

## 2024-01-26 DIAGNOSIS — K4090 Unilateral inguinal hernia, without obstruction or gangrene, not specified as recurrent: Secondary | ICD-10-CM | POA: Diagnosis not present

## 2024-01-26 DIAGNOSIS — Z794 Long term (current) use of insulin: Secondary | ICD-10-CM | POA: Insufficient documentation

## 2024-01-26 DIAGNOSIS — N189 Chronic kidney disease, unspecified: Secondary | ICD-10-CM | POA: Diagnosis not present

## 2024-01-26 HISTORY — DX: Chronic kidney disease, unspecified: N18.9

## 2024-01-26 HISTORY — DX: Anemia, unspecified: D64.9

## 2024-01-26 HISTORY — DX: Acquired absence of unspecified leg below knee: Z89.519

## 2024-01-26 HISTORY — DX: Pneumonia, unspecified organism: J18.9

## 2024-01-26 HISTORY — DX: Heart failure, unspecified: I50.9

## 2024-01-26 LAB — CBC
HCT: 45.1 % (ref 39.0–52.0)
Hemoglobin: 13.7 g/dL (ref 13.0–17.0)
MCH: 30.4 pg (ref 26.0–34.0)
MCHC: 30.4 g/dL (ref 30.0–36.0)
MCV: 100.2 fL — ABNORMAL HIGH (ref 80.0–100.0)
Platelets: 206 K/uL (ref 150–400)
RBC: 4.5 MIL/uL (ref 4.22–5.81)
RDW: 13.2 % (ref 11.5–15.5)
WBC: 11.9 K/uL — ABNORMAL HIGH (ref 4.0–10.5)
nRBC: 0 % (ref 0.0–0.2)

## 2024-01-26 LAB — BASIC METABOLIC PANEL WITH GFR
Anion gap: 14 (ref 5–15)
BUN: 16 mg/dL (ref 8–23)
CO2: 23 mmol/L (ref 22–32)
Calcium: 9.7 mg/dL (ref 8.9–10.3)
Chloride: 105 mmol/L (ref 98–111)
Creatinine, Ser: 1.54 mg/dL — ABNORMAL HIGH (ref 0.61–1.24)
GFR, Estimated: 46 mL/min — ABNORMAL LOW (ref >60)
Glucose, Bld: 91 mg/dL (ref 70–99)
Potassium: 4.7 mmol/L (ref 3.5–5.1)
Sodium: 142 mmol/L (ref 135–145)

## 2024-01-26 LAB — GLUCOSE, CAPILLARY
Glucose-Capillary: 115 mg/dL — ABNORMAL HIGH (ref 70–99)
Glucose-Capillary: 69 mg/dL — ABNORMAL LOW (ref 70–99)

## 2024-01-26 NOTE — Patient Instructions (Addendum)
 SURGICAL WAITING ROOM VISITATION  Patients having surgery or a procedure may have no more than 2 support people in the waiting area - these visitors may rotate.    Children under the age of 56 must have an adult with them who is not the patient.  Visitors with respiratory illnesses are discouraged from visiting and should remain at home.  If the patient needs to stay at the hospital during part of their recovery, the visitor guidelines for inpatient rooms apply. Pre-op nurse will coordinate an appropriate time for 1 support person to accompany patient in pre-op.  This support person may not rotate.    Please refer to the [facility] website for the visitor guidelines for Inpatients (after your surgery is over and you are in a regular room).    Your procedure is scheduled on: 02/09/24   Report to [facility] Main Entrance    Report to admitting at 8:45 AM   Call this number if you have problems the morning of surgery [phone]   Do not eat food :After Midnight.   After Midnight you may have the following liquids until 8:00 AM DAY OF SURGERY  Water Non-Citrus Juices (without pulp, NO RED-Apple, White grape, White cranberry) Black Coffee (NO MILK/CREAM OR CREAMERS, sugar ok)  Clear Tea (NO MILK/CREAM OR CREAMERS, sugar ok) regular and decaf                             Plain Jell-O (NO RED)                                           Fruit ices (not with fruit pulp, NO RED)                                     Popsicles (NO RED)                                                               Sports drinks like Gatorade (NO RED)                 The day of surgery:  Drink ONE (1) Pre-Surgery Clear Ensure at 8:00 AM the morning of surgery. Drink in one sitting. Do not sip.  This drink was given to you during your hospital  pre-op appointment visit. Nothing else to drink after completing the  Pre-Surgery Clear Ensure.          If you have questions, please contact your  surgeon's office.   FOLLOW BOWEL PREP AND ANY ADDITIONAL PRE OP INSTRUCTIONS YOU RECEIVED FROM YOUR SURGEON'S OFFICE!!!     Oral Hygiene is also important to reduce your risk of infection.                                    Remember - BRUSH YOUR TEETH THE MORNING OF SURGERY WITH YOUR REGULAR TOOTHPASTE  DENTURES WILL BE REMOVED PRIOR TO SURGERY PLEASE DO NOT APPLY Poly grip OR  ADHESIVES!!!   Stop all vitamins and herbal supplements 7 days before surgery.   Take these medicines the morning of surgery with A SIP OF WATER: Amlodipine    DO NOT TAKE ANY ORAL DIABETIC MEDICATIONS DAY OF YOUR SURGERY  How to Manage Your Diabetes Before and After Surgery  Why is it important to control my blood sugar before and after surgery? Improving blood sugar levels before and after surgery helps healing and can limit problems. A way of improving blood sugar control is eating a healthy diet by:  Eating less sugar and carbohydrates  Increasing activity/exercise  Talking with your doctor about reaching your blood sugar goals High blood sugars (greater than 180 mg/dL) can raise your risk of infections and slow your recovery, so you will need to focus on controlling your diabetes during the weeks before surgery. Make sure that the doctor who takes care of your diabetes knows about your planned surgery including the date and location.  How do I manage my blood sugar before surgery? Check your blood sugar at least 4 times a day, starting 2 days before surgery, to make sure that the level is not too high or low. Check your blood sugar the morning of your surgery when you wake up and every 2 hours until you get to the Short Stay unit. If your blood sugar is less than 70 mg/dL, you will need to treat for low blood sugar: Do not take insulin . Treat a low blood sugar (less than 70 mg/dL) with  cup of clear juice (cranberry or apple), 4 glucose tablets, OR glucose gel. Recheck blood sugar in 15 minutes after  treatment (to make sure it is greater than 70 mg/dL). If your blood sugar is not greater than 70 mg/dL on recheck, call [phone] for further instructions. Report your blood sugar to the short stay nurse when you get to Short Stay.  If you are admitted to the hospital after surgery: Your blood sugar will be checked by the staff and you will probably be given insulin  after surgery (instead of oral diabetes medicines) to make sure you have good blood sugar levels. The goal for blood sugar control after surgery is 80-180 mg/dL.   WHAT DO I DO ABOUT MY DIABETES MEDICATION?  Do not take oral diabetes medicines (pills) the morning of surgery.  THE DAY BEFORE SURGERY, take morning dose of Glimepiride  as prescribed. Take 50% of insulin  (Basaglar ), 10 units.     THE MORNING OF SURGERY, do not take Glimepiride  or Insulin .  Reviewed and Endorsed by [facility] Patient Education Committee, August 2015                              You may not have any metal on your body including jewelry, and body piercing             Do not wear lotions, powders, cologne, or deodorant              Men may shave face and neck.   Do not bring valuables to the hospital. [facility] IS NOT             RESPONSIBLE   FOR VALUABLES.   Contacts, glasses, dentures or bridgework may not be worn into surgery.   Bring small overnight bag day of surgery.   DO NOT BRING YOUR HOME MEDICATIONS TO THE HOSPITAL. PHARMACY WILL DISPENSE MEDICATIONS LISTED ON YOUR MEDICATION LIST TO YOU DURING  YOUR ADMISSION IN THE HOSPITAL!    Patients discharged on the day of surgery will not be allowed to drive home.  Someone NEEDS to stay with you for the first 24 hours after anesthesia.              Please read over the following fact sheets you were given: IF YOU HAVE QUESTIONS ABOUT YOUR PRE-OP INSTRUCTIONS PLEASE CALL [phone].   If you received a COVID test during your pre-op visit  it is requested that you wear a mask  when out in public, stay away from anyone that may not be feeling well and notify your surgeon if you develop symptoms. If you test positive for Covid or have been in contact with anyone that has tested positive in the last 10 days please notify you surgeon.    [facility] - Preparing for Surgery Before surgery, you can play an important role.  Because skin is not sterile, your skin needs to be as free of germs as possible.  You can reduce the number of germs on your skin by washing with CHG (chlorahexidine gluconate) soap before surgery.  CHG is an antiseptic cleaner which kills germs and bonds with the skin to continue killing germs even after washing. Please DO NOT use if you have an allergy to CHG or antibacterial soaps.  If your skin becomes reddened/irritated stop using the CHG and inform your nurse when you arrive at Short Stay. Do not shave (including legs and underarms) for at least 48 hours prior to the first CHG shower.  You may shave your face/neck.  Please follow these instructions carefully:  1.  Shower with CHG Soap the night before surgery and the  morning of surgery.  2.  If you choose to wash your hair, wash your hair first as usual with your normal  shampoo.  3.  After you shampoo, rinse your hair and body thoroughly to remove the shampoo.                             4.  Use CHG as you would any other liquid soap.  You can apply chg directly to the skin and wash.  Gently with a scrungie or clean washcloth.  5.  Apply the CHG Soap to your body ONLY FROM THE NECK DOWN.   Do   not use on face/ open                           Wound or open sores. Avoid contact with eyes, ears mouth and   genitals (private parts).                       Wash face,  Genitals (private parts) with your normal soap.             6.  Wash thoroughly, paying special attention to the area where your    surgery  will be performed.  7.  Thoroughly rinse your body with warm water from the neck down.  8.  DO NOT  shower/wash with your normal soap after using and rinsing off the CHG Soap.                9.  Pat yourself dry with a clean towel.            10.  Wear clean pajamas.  11.  Place clean sheets on your bed the night of your first shower and do not  sleep with pets. Day of Surgery : Do not apply any lotions/deodorants the morning of surgery.  Please wear clean clothes to the hospital/surgery center.  FAILURE TO FOLLOW THESE INSTRUCTIONS MAY RESULT IN THE CANCELLATION OF YOUR SURGERY  PATIENT SIGNATURE_________________________________  NURSE SIGNATURE__________________________________  ________________________________________________________________________

## 2024-01-26 NOTE — Progress Notes (Addendum)
 COVID Vaccine Completed:  Date of COVID positive in last 90 days:  PCP - Corrina Sabin, MD Cardiologist - Vina Gull, MD not since 2020  Chest x-ray - N/A EKG - not able to do at PST, do DOS Stress Test - N/A ECHO - 08/16/18 Epic Cardiac Cath -  Pacemaker/ICD device last checked:N/A Spinal Cord Stimulator:N/A  Bowel Prep - N/A  Sleep Study - N/A CPAP -   Fasting Blood Sugar - 90-170 Checks Blood Sugar 1 times a day  Last dose of GLP1 agonist-  N/A GLP1 instructions:  Do not take after     Last dose of SGLT-2 inhibitors-  N/A SGLT-2 instructions:  Do not take after     Blood Thinner Instructions: N/A Last dose:   Time: Aspirin  Instructions: ASA 81, hold 7 days Last Dose:  Activity level: Can go up a flight of stairs and perform activities of daily living without stopping and without symptoms of chest pain or shortness of breath. Has bilateral prosthetic legs  Anesthesia review: HTN, PVD, a fib, CHF, HTN, DM, CKD, anemia, bilateral BKA, BS 68 at PAT, gave orange juice, came up to 115. Denies any symptoms.   Patient denies shortness of breath, fever, cough and chest pain at PAT appointment  Patient verbalized understanding of instructions that were given to them at the PAT appointment. Patient was also instructed that they will need to review over the PAT instructions again at home before surgery.

## 2024-01-30 ENCOUNTER — Encounter (HOSPITAL_COMMUNITY): Payer: Self-pay

## 2024-01-30 LAB — HEMOGLOBIN A1C
Hgb A1c MFr Bld: 7 % — ABNORMAL HIGH (ref 4.8–5.6)
Mean Plasma Glucose: 154 mg/dL

## 2024-01-30 NOTE — Progress Notes (Signed)
 Case: [id number] Date/Time: 02/09/24 1045   Procedure: REPAIR, HERNIA, INGUINAL, LAPAROSCOPIC (Right) - LAPAROSCOPIC RIGHT AND POSSIBLE LEFT INGUINAL HERNIA REPAIR WITH MESH   Anesthesia type: General   Diagnosis: Right inguinal hernia [K40.90]   Pre-op diagnosis: RIGHT AND POSSIBLE LEFT INGUINAL HERNIA   Location: [facility]OM 02 / [facility]   Surgeons: Joseph Standing, MD       DISCUSSION: Joseph [name] is a 78 yo male with PMH of HTN, HFpEF, A.fib (not on anticoagulation), PVD, IDDM (A1c 7), CKD, hx of bilateral BKA.  Prior anesthesia complications include PONV  Patient underwent R BKA in 10/2018 due to a non-healing wound. He was also diagnosed with new onset A.fib with RVR during that admission. Echo during that admission showed preserved LVEF, moderate MAC, mod-severe TR, mild aortic stenosis. He was deemed to not be a candidate for anticoagulation at that time due to severe anemia and possible GIB. Outpatient ischemic testing and f/u recommended however does not appear patient ever followed up with Cardiology.  Patient underwent L BKA in 12/2020 due to osteomyelitis from severe PVD. Vascular was consulted however patient was not a candidate for revascularization and so ultimately underwent L BKA with Dr. Harden.   Last seen by PCP on 10/18/23. DM and BP has been controlled. He was referred to Gen Surg for inguinal hernia.   Discussed with Dr. Patrisha due to no Cardiology f/u. Ok to proceed if patient is asymptomatic with good functional status. Notes indicate patient is very active with bilateral prostheses. He denies symptoms at PAT visit.   VS: BP 134/80   Pulse 71   Temp 36.8 C (Oral)   Resp 16   Ht 6' 3 (1.905 m)   SpO2 99%   BMI 28.12 kg/m   PROVIDERS: Joseph Clam, MD   LABS: Labs reviewed: Acceptable for surgery. (all labs ordered are listed, but only abnormal results are displayed)  Labs Reviewed  HEMOGLOBIN A1C - Abnormal; Notable for the following components:       Result Value   Hgb A1c MFr Bld 7.0 (*)    All other components within normal limits  BASIC METABOLIC PANEL WITH GFR - Abnormal; Notable for the following components:   Creatinine, Ser 1.54 (*)    GFR, Estimated 46 (*)    All other components within normal limits  CBC - Abnormal; Notable for the following components:   WBC 11.9 (*)    MCV 100.2 (*)    All other components within normal limits  GLUCOSE, CAPILLARY - Abnormal; Notable for the following components:   Glucose-Capillary 69 (*)    All other components within normal limits  GLUCOSE, CAPILLARY - Abnormal; Notable for the following components:   Glucose-Capillary 115 (*)    All other components within normal limits     IMAGES:   EKG: Obtain DOS   CV:  Echo 11/16/2018:  IMPRESSIONS    1. Moderate hypokinesis of the left ventricular, entire anteroseptal wall.  2. The left ventricle has low normal systolic function, with an ejection fraction of 50-55%. The cavity size was normal. Left ventricular diastolic Doppler parameters are consistent with pseudonormalization.  3. The right ventricle has normal systolic function. The cavity was normal. There is no increase in right ventricular wall thickness. Right ventricular systolic pressure is mildly elevated.  4. Left atrial size was mildly dilated.  5. The mitral valve is grossly normal. Mild thickening of the mitral valve leaflet. Mild calcification of the mitral valve leaflet. There is moderate  mitral annular calcification present.  6. Tricuspid valve regurgitation is moderate-severe.  7. The aortic valve is tricuspid. Moderate thickening of the aortic valve. Moderate calcification of the aortic valve. Aortic valve regurgitation is trivial by color flow Doppler. Mild stenosis of the aortic valve.  8. RCC appears fixed but LCC/NCC open well.  9. The inferior vena cava was dilated in size with <50% respiratory variability.  Past Medical History:  Diagnosis Date    Anemia    Cellulitis of right lower extremity    CHF (congestive heart failure) (HCC)    Chronic kidney disease    Diabetes mellitus type 2 in nonobese (HCC)    Gangrene of right foot (HCC)    Hx of BKA (HCC)    bilateral   Hypertension    Open wound of right foot    Pneumonia     Past Surgical History:  Procedure Laterality Date   ABDOMINAL AORTOGRAM W/LOWER EXTREMITY Left 01/27/2021   Procedure: ABDOMINAL AORTOGRAM W/LOWER EXTREMITY;  Surgeon: Joseph Debby SAILOR, MD;  [name];  Service: Cardiovascular;  Laterality: Left;   AMPUTATION Right 11/16/2018   Procedure: RIGHT BELOW KNEE AMPUTATION;  Surgeon: Joseph Jerona GAILS, MD;  Location: [facility] OR;  Service: Orthopedics;  Laterality: Right;   AMPUTATION Left 02/03/2021   Procedure: LEFT BELOW KNEE AMPUTATION;  Surgeon: Joseph Jerona GAILS, MD;  Location: [facility] OR;  Service: Orthopedics;  Laterality: Left;   APPLICATION OF WOUND VAC  02/03/2021   Procedure: APPLICATION OF WOUND VAC;  Surgeon: Joseph Jerona GAILS, MD;  Location: [name];  Service: Orthopedics;;   HAND SURGERY      MEDICATIONS:  amLODipine  (NORVASC ) 10 MG tablet   aspirin  EC 81 MG EC tablet   atorvastatin  (LIPITOR) 10 MG tablet   Blood Glucose Monitoring Suppl (ACCU-CHEK GUIDE) w/Device KIT   Blood Glucose Monitoring Suppl (TRUE METRIX METER) w/Device KIT   dapagliflozin  propanediol (FARXIGA ) 10 MG TABS tablet   ferrous sulfate  (FEROSUL) 325 (65 FE) MG tablet   glimepiride  (AMARYL ) 4 MG tablet   glucose blood (TRUE METRIX BLOOD GLUCOSE TEST) test strip   Insulin  Glargine (BASAGLAR  KWIKPEN) 100 UNIT/ML   Insulin  Pen Needle (INSUPEN PEN NEEDLES) 32G X 4 MM MISC   Misc. Devices MISC   Multiple Vitamins-Minerals (ONE-A-DAY MENS 50+ ADVANTAGE) TABS   polyethylene glycol (MIRALAX  / GLYCOLAX ) 17 g packet   TRUEplus Lancets 28G MISC   No current facility-administered medications for this encounter.   Joseph [name] Joseph [name] Short Stay/Anesthesiology [facility] Phone  [phone] 01/30/2024 2:28 PM

## 2024-01-30 NOTE — Anesthesia Preprocedure Evaluation (Addendum)
 Anesthesia Evaluation  Patient identified by MRN, date of birth, ID band Patient awake    Reviewed: Allergy & Precautions, NPO status , Patient's Chart, lab work & pertinent test results  History of Anesthesia Complications Negative for: history of anesthetic complications  Airway Mallampati: II  TM Distance: >3 FB Neck ROM: Full    Dental  (+) Dental Advisory Given, Chipped   Pulmonary neg pulmonary ROS   Pulmonary exam normal        Cardiovascular hypertension, Pt. on medications + Peripheral Vascular Disease and +CHF  Normal cardiovascular exam+ dysrhythmias Atrial Fibrillation + Valvular Problems/Murmurs    '20 TTE - Moderate hypokinesis of the left ventricular, entire anteroseptal wall. EF 50-55%. Left ventricular diastolic Doppler parameters are consistent with pseudonormalization. Right ventricular systolic pressure is mildly elevated. Left atrial size was mildly dilated. Tricuspid valve regurgitation is moderate-severe. Aortic valve regurgitation is trivial by color flow Doppler. Mild stenosis of the aortic valve. RCC appears fixed but LCC/NCC open well.      Neuro/Psych  Neuromuscular disease  negative psych ROS   GI/Hepatic negative GI ROS, Neg liver ROS,,,  Endo/Other  diabetes, Type 2, Insulin  Dependent, Oral Hypoglycemic Agents    Renal/GU CRFRenal disease     Musculoskeletal negative musculoskeletal ROS (+)    Abdominal   Peds  Hematology negative hematology ROS (+)   Anesthesia Other Findings RIGHT AND POSSIBLE LEFT INGUINAL HERNIA  Reproductive/Obstetrics                              Anesthesia Physical Anesthesia Plan  ASA: 3  Anesthesia Plan: General   Post-op Pain Management: Tylenol  PO (pre-op)*   Induction: Intravenous  PONV Risk Score and Plan: 2 and Treatment may vary due to age or medical condition, Ondansetron  and Propofol  infusion  Airway Management  Planned: Oral ETT  Additional Equipment: None  Intra-op Plan:   Post-operative Plan: Extubation in OR  Informed Consent: I have reviewed the patients History and Physical, chart, labs and discussed the procedure including the risks, benefits and alternatives for the proposed anesthesia with the patient or authorized representative who has indicated his/her understanding and acceptance.     Dental advisory given  Plan Discussed with: CRNA and Anesthesiologist  Anesthesia Plan Comments: (See PAT note from 8/29)         Anesthesia Quick Evaluation

## 2024-02-01 ENCOUNTER — Other Ambulatory Visit: Payer: Self-pay

## 2024-02-02 ENCOUNTER — Other Ambulatory Visit: Payer: Self-pay

## 2024-02-05 ENCOUNTER — Other Ambulatory Visit: Payer: Self-pay

## 2024-02-09 ENCOUNTER — Other Ambulatory Visit: Payer: Self-pay

## 2024-02-09 ENCOUNTER — Encounter (HOSPITAL_COMMUNITY): Payer: Self-pay | Admitting: Surgery

## 2024-02-09 ENCOUNTER — Encounter (HOSPITAL_COMMUNITY)
Admission: RE | Disposition: A | Discharge status: Home / Self Care | Payer: Self-pay | Source: Home / Self Care | Attending: Surgery

## 2024-02-09 ENCOUNTER — Other Ambulatory Visit (HOSPITAL_COMMUNITY): Payer: Self-pay

## 2024-02-09 ENCOUNTER — Ambulatory Visit (HOSPITAL_COMMUNITY): Payer: Self-pay | Admitting: Anesthesiology

## 2024-02-09 ENCOUNTER — Ambulatory Visit (HOSPITAL_COMMUNITY)
Admission: RE | Admit: 2024-02-09 | Discharge: 2024-02-09 | Disposition: A | Discharge status: Home / Self Care | Attending: Surgery | Admitting: Surgery

## 2024-02-09 ENCOUNTER — Ambulatory Visit (HOSPITAL_COMMUNITY): Payer: Self-pay | Admitting: Medical

## 2024-02-09 DIAGNOSIS — N182 Chronic kidney disease, stage 2 (mild): Secondary | ICD-10-CM | POA: Insufficient documentation

## 2024-02-09 DIAGNOSIS — Z7984 Long term (current) use of oral hypoglycemic drugs: Secondary | ICD-10-CM | POA: Diagnosis not present

## 2024-02-09 DIAGNOSIS — K4020 Bilateral inguinal hernia, without obstruction or gangrene, not specified as recurrent: Secondary | ICD-10-CM | POA: Insufficient documentation

## 2024-02-09 DIAGNOSIS — I130 Hypertensive heart and chronic kidney disease with heart failure and stage 1 through stage 4 chronic kidney disease, or unspecified chronic kidney disease: Secondary | ICD-10-CM | POA: Diagnosis not present

## 2024-02-09 DIAGNOSIS — Z89512 Acquired absence of left leg below knee: Secondary | ICD-10-CM | POA: Diagnosis not present

## 2024-02-09 DIAGNOSIS — I5032 Chronic diastolic (congestive) heart failure: Secondary | ICD-10-CM | POA: Insufficient documentation

## 2024-02-09 DIAGNOSIS — Z79899 Other long term (current) drug therapy: Secondary | ICD-10-CM | POA: Diagnosis not present

## 2024-02-09 DIAGNOSIS — D176 Benign lipomatous neoplasm of spermatic cord: Secondary | ICD-10-CM | POA: Insufficient documentation

## 2024-02-09 DIAGNOSIS — E1151 Type 2 diabetes mellitus with diabetic peripheral angiopathy without gangrene: Secondary | ICD-10-CM | POA: Insufficient documentation

## 2024-02-09 DIAGNOSIS — Z7982 Long term (current) use of aspirin: Secondary | ICD-10-CM | POA: Diagnosis not present

## 2024-02-09 DIAGNOSIS — I480 Paroxysmal atrial fibrillation: Secondary | ICD-10-CM | POA: Insufficient documentation

## 2024-02-09 DIAGNOSIS — Z833 Family history of diabetes mellitus: Secondary | ICD-10-CM | POA: Diagnosis not present

## 2024-02-09 DIAGNOSIS — K4090 Unilateral inguinal hernia, without obstruction or gangrene, not specified as recurrent: Secondary | ICD-10-CM | POA: Diagnosis present

## 2024-02-09 DIAGNOSIS — Z89511 Acquired absence of right leg below knee: Secondary | ICD-10-CM | POA: Insufficient documentation

## 2024-02-09 DIAGNOSIS — Z794 Long term (current) use of insulin: Secondary | ICD-10-CM | POA: Diagnosis not present

## 2024-02-09 DIAGNOSIS — E1142 Type 2 diabetes mellitus with diabetic polyneuropathy: Secondary | ICD-10-CM | POA: Diagnosis not present

## 2024-02-09 DIAGNOSIS — E785 Hyperlipidemia, unspecified: Secondary | ICD-10-CM | POA: Diagnosis not present

## 2024-02-09 DIAGNOSIS — E1122 Type 2 diabetes mellitus with diabetic chronic kidney disease: Secondary | ICD-10-CM | POA: Insufficient documentation

## 2024-02-09 DIAGNOSIS — E1165 Type 2 diabetes mellitus with hyperglycemia: Secondary | ICD-10-CM

## 2024-02-09 HISTORY — PX: INGUINAL HERNIA REPAIR: SHX194

## 2024-02-09 LAB — GLUCOSE, CAPILLARY
Glucose-Capillary: 118 mg/dL — ABNORMAL HIGH (ref 70–99)
Glucose-Capillary: 87 mg/dL (ref 70–99)
Glucose-Capillary: 95 mg/dL (ref 70–99)

## 2024-02-09 SURGERY — REPAIR, HERNIA, INGUINAL, LAPAROSCOPIC
Anesthesia: General | Site: Inguinal | Laterality: Bilateral

## 2024-02-09 MED ORDER — ONDANSETRON HCL 4 MG/2ML IJ SOLN
INTRAMUSCULAR | Status: DC | PRN
  Administered 2024-02-09: 4 mg via INTRAVENOUS

## 2024-02-09 MED ORDER — CHLORHEXIDINE GLUCONATE CLOTH 2 % EX PADS: 6.0000 | MEDICATED_PAD | Freq: Once | CUTANEOUS | Status: DC

## 2024-02-09 MED ORDER — FENTANYL CITRATE (PF) 250 MCG/5ML IJ SOLN
INTRAMUSCULAR | Status: AC
  Filled 2024-02-09: qty 5

## 2024-02-09 MED ORDER — STERILE WATER FOR IRRIGATION IR SOLN
Status: DC | PRN
  Administered 2024-02-09: 1000 mL

## 2024-02-09 MED ORDER — BUPIVACAINE LIPOSOME 1.3 % IJ SUSP
INTRAMUSCULAR | Status: AC
Start: 2024-02-09 — End: 2024-02-09
  Filled 2024-02-09: qty 20

## 2024-02-09 MED ORDER — ONDANSETRON HCL 4 MG/2ML IJ SOLN: 4.0000 mg | Freq: Once | INTRAMUSCULAR | Status: DC | PRN

## 2024-02-09 MED ORDER — HYDROGEN PEROXIDE 3 % EX SOLN
CUTANEOUS | Status: AC
  Filled 2024-02-09: qty 473

## 2024-02-09 MED ORDER — CHLORHEXIDINE GLUCONATE CLOTH 2 % EX PADS
6.0000 | MEDICATED_PAD | Freq: Once | CUTANEOUS | Status: DC
Start: 2024-02-09 — End: 2024-02-09

## 2024-02-09 MED ORDER — BUPIVACAINE LIPOSOME 1.3 % IJ SUSP: 20.0000 mL | Freq: Once | INTRAMUSCULAR | Status: DC

## 2024-02-09 MED ORDER — ORAL CARE MOUTH RINSE: 15.0000 mL | Freq: Once | OROMUCOSAL | Status: AC

## 2024-02-09 MED ORDER — CEFAZOLIN SODIUM-DEXTROSE 2-4 GM/100ML-% IV SOLN
2.0000 g | INTRAVENOUS | Status: AC
  Administered 2024-02-09: 2 g via INTRAVENOUS
  Filled 2024-02-09: qty 100

## 2024-02-09 MED ORDER — OXYCODONE HCL 5 MG PO TABS
5.0000 mg | ORAL_TABLET | Freq: Once | ORAL | Status: AC | PRN
  Administered 2024-02-09: 5 mg via ORAL

## 2024-02-09 MED ORDER — ROCURONIUM BROMIDE 10 MG/ML (PF) SYRINGE
PREFILLED_SYRINGE | INTRAVENOUS | Status: AC
  Filled 2024-02-09: qty 10

## 2024-02-09 MED ORDER — CHLORHEXIDINE GLUCONATE 0.12 % MT SOLN
15.0000 mL | Freq: Once | OROMUCOSAL | Status: AC
  Administered 2024-02-09: 15 mL via OROMUCOSAL

## 2024-02-09 MED ORDER — LIDOCAINE HCL (PF) 2 % IJ SOLN
INTRAMUSCULAR | Status: AC
  Filled 2024-02-09: qty 5

## 2024-02-09 MED ORDER — OXYCODONE HCL 5 MG PO TABS
ORAL_TABLET | ORAL | Status: AC
  Filled 2024-02-09: qty 1

## 2024-02-09 MED ORDER — LACTATED RINGERS IR SOLN
Status: DC | PRN
  Administered 2024-02-09: 1000 mL

## 2024-02-09 MED ORDER — SUGAMMADEX SODIUM 200 MG/2ML IV SOLN
INTRAVENOUS | Status: AC
  Filled 2024-02-09: qty 2

## 2024-02-09 MED ORDER — SODIUM CHLORIDE 0.9 % IR SOLN
Status: DC | PRN
  Administered 2024-02-09 (×2): 1000 mL

## 2024-02-09 MED ORDER — FENTANYL CITRATE (PF) 250 MCG/5ML IJ SOLN
INTRAMUSCULAR | Status: DC | PRN
  Administered 2024-02-09: 100 ug via INTRAVENOUS
  Administered 2024-02-09: 50 ug via INTRAVENOUS
  Administered 2024-02-09: 100 ug via INTRAVENOUS

## 2024-02-09 MED ORDER — BUPIVACAINE-EPINEPHRINE (PF) 0.25% -1:200000 IJ SOLN
INTRAMUSCULAR | Status: AC
  Filled 2024-02-09: qty 60

## 2024-02-09 MED ORDER — TRAMADOL HCL 50 MG PO TABS
50.0000 mg | ORAL_TABLET | Freq: Four times a day (QID) | ORAL | 0 refills | Status: AC | PRN
  Filled 2024-02-09: qty 20, 3d supply, fill #0

## 2024-02-09 MED ORDER — OXYCODONE HCL 5 MG/5ML PO SOLN: 5.0000 mg | Freq: Once | ORAL | Status: AC | PRN

## 2024-02-09 MED ORDER — LACTATED RINGERS IV SOLN: INTRAVENOUS | Status: DC

## 2024-02-09 MED ORDER — PROPOFOL 500 MG/50ML IV EMUL
INTRAVENOUS | Status: DC | PRN
  Administered 2024-02-09: 25 ug/kg/min via INTRAVENOUS

## 2024-02-09 MED ORDER — BUPIVACAINE-EPINEPHRINE 0.25% -1:200000 IJ SOLN
INTRAMUSCULAR | Status: DC | PRN
  Administered 2024-02-09 (×2): 30 mL

## 2024-02-09 MED ORDER — INSULIN ASPART 100 UNIT/ML IJ SOLN: 0.0000 [IU] | INTRAMUSCULAR | Status: DC | PRN

## 2024-02-09 MED ORDER — FENTANYL CITRATE PF 50 MCG/ML IJ SOSY: 25.0000 ug | PREFILLED_SYRINGE | INTRAMUSCULAR | Status: DC | PRN

## 2024-02-09 MED ORDER — PROPOFOL 10 MG/ML IV BOLUS
INTRAVENOUS | Status: DC | PRN
Start: 2024-02-09 — End: 2024-02-09
  Administered 2024-02-09: 100 mg via INTRAVENOUS

## 2024-02-09 MED ORDER — PROPOFOL 10 MG/ML IV BOLUS
INTRAVENOUS | Status: AC
  Filled 2024-02-09: qty 20

## 2024-02-09 MED ORDER — ACETAMINOPHEN 500 MG PO TABS
1000.0000 mg | ORAL_TABLET | ORAL | Status: AC
Start: 2024-02-09 — End: 2024-02-09
  Administered 2024-02-09: 1000 mg via ORAL
  Filled 2024-02-09: qty 2

## 2024-02-09 MED ORDER — LIDOCAINE 2% (20 MG/ML) 5 ML SYRINGE
INTRAMUSCULAR | Status: DC | PRN
  Administered 2024-02-09: 60 mg via INTRAVENOUS

## 2024-02-09 MED ORDER — PHENYLEPHRINE HCL-NACL 20-0.9 MG/250ML-% IV SOLN
INTRAVENOUS | Status: DC | PRN
  Administered 2024-02-09: 50 ug/min via INTRAVENOUS

## 2024-02-09 MED ORDER — SUGAMMADEX SODIUM 200 MG/2ML IV SOLN
INTRAVENOUS | Status: DC | PRN
  Administered 2024-02-09: 200 mg via INTRAVENOUS

## 2024-02-09 MED ORDER — ACETAMINOPHEN 500 MG PO TABS: 1000.0000 mg | ORAL_TABLET | Freq: Once | ORAL | Status: DC

## 2024-02-09 MED ORDER — ONDANSETRON HCL 4 MG/2ML IJ SOLN
INTRAMUSCULAR | Status: AC
  Filled 2024-02-09: qty 2

## 2024-02-09 MED ORDER — ROCURONIUM 10MG/ML (10ML) SYRINGE FOR MEDFUSION PUMP - OPTIME
INTRAVENOUS | Status: DC | PRN
  Administered 2024-02-09 (×2): 50 mg via INTRAVENOUS

## 2024-02-09 MED ORDER — GABAPENTIN 300 MG PO CAPS
300.0000 mg | ORAL_CAPSULE | ORAL | Status: AC
  Administered 2024-02-09: 300 mg via ORAL
  Filled 2024-02-09: qty 1

## 2024-02-09 SURGICAL SUPPLY — 37 items
BAG COUNTER SPONGE SURGICOUNT (BAG) IMPLANT
CABLE HIGH FREQUENCY MONO STRZ (ELECTRODE) ×2 IMPLANT
CHLORAPREP W/TINT 26 (MISCELLANEOUS) ×2 IMPLANT
COVER SURGICAL LIGHT HANDLE (MISCELLANEOUS) ×2 IMPLANT
DEVICE SECURE STRAP 25 ABSORB (INSTRUMENTS) IMPLANT
DRAPE WARM FLUID 44X44 (DRAPES) ×2 IMPLANT
DRSG TEGADERM 2-3/8X2-3/4 SM (GAUZE/BANDAGES/DRESSINGS) ×4 IMPLANT
DRSG TEGADERM 4X4.75 (GAUZE/BANDAGES/DRESSINGS) ×2 IMPLANT
ELECT REM PT RETURN 15FT ADLT (MISCELLANEOUS) ×2 IMPLANT
GAUZE SPONGE 2X2 8PLY STRL LF (GAUZE/BANDAGES/DRESSINGS) ×2 IMPLANT
GLOVE ECLIPSE 8.0 STRL XLNG CF (GLOVE) ×2 IMPLANT
GLOVE INDICATOR 8.0 STRL GRN (GLOVE) ×2 IMPLANT
GOWN STRL REUS W/ TWL XL LVL3 (GOWN DISPOSABLE) ×2 IMPLANT
IRRIGATION SUCT STRKRFLW 2 WTP (MISCELLANEOUS) IMPLANT
KIT BASIN OR (CUSTOM PROCEDURE TRAY) ×2 IMPLANT
KIT TURNOVER KIT A (KITS) ×2 IMPLANT
MARKER SKIN DUAL TIP RULER LAB (MISCELLANEOUS) ×2 IMPLANT
MESH HERNIA 6X6 BARD (Mesh General) IMPLANT
NDL INSUFFLATION 14GA 120MM (NEEDLE) IMPLANT
NEEDLE INSUFFLATION 14GA 120MM (NEEDLE) IMPLANT
PAD POSITIONING PINK XL (MISCELLANEOUS) ×2 IMPLANT
SCISSORS LAP 5X35 DISP (ENDOMECHANICALS) ×2 IMPLANT
SET TUBE SMOKE EVAC HIGH FLOW (TUBING) ×2 IMPLANT
SLEEVE ADV FIXATION 12X100MM (TROCAR) IMPLANT
SLEEVE ADV FIXATION 5X100MM (TROCAR) ×4 IMPLANT
SPIKE FLUID TRANSFER (MISCELLANEOUS) ×2 IMPLANT
SUT MNCRL AB 4-0 PS2 18 (SUTURE) ×2 IMPLANT
SUT PDS AB 1 CT1 27 (SUTURE) ×4 IMPLANT
SUT STRATAFIX SPIRAL PDS3-0 (SUTURE) IMPLANT
SUT VIC AB 2-0 SH 27X BRD (SUTURE) IMPLANT
SUT VICRYL 0 UR6 27IN ABS (SUTURE) ×2 IMPLANT
SUTURE STRATFX SPIRAL 3-0 PDS+ (SUTURE) IMPLANT
TOWEL OR 17X26 10 PK STRL BLUE (TOWEL DISPOSABLE) ×2 IMPLANT
TRAY FOLEY MTR SLVR 16FR STAT (SET/KITS/TRAYS/PACK) IMPLANT
TRAY LAPAROSCOPIC (CUSTOM PROCEDURE TRAY) ×2 IMPLANT
TROCAR ADV FIXATION 5X100MM (TROCAR) ×2 IMPLANT
TROCAR BALLN 12MMX100 BLUNT (TROCAR) ×2 IMPLANT

## 2024-02-09 NOTE — Interval H&P Note (Signed)
 History and Physical Interval Note:  02/09/2024 11:31 AM  Joseph [name]  has presented today for surgery, with the diagnosis of RIGHT AND POSSIBLE LEFT INGUINAL HERNIA.  The various methods of treatment have been discussed with the patient and family. After consideration of risks, benefits and other options for treatment, the patient has consented to  Procedure(s) with comments: REPAIR, HERNIA, INGUINAL, LAPAROSCOPIC (Right) - LAPAROSCOPIC RIGHT AND POSSIBLE LEFT INGUINAL HERNIA REPAIR WITH MESH as a surgical intervention.  The patient's history has been reviewed, patient examined, no change in status, stable for surgery.  I have reviewed the patient's chart and labs.  Questions were answered to the patient's satisfaction.    I have re-reviewed the the patient's records, history, medications, and allergies.  I have re-examined the patient.  I again discussed intraoperative plans and goals of post-operative recovery.  The patient agrees to proceed.  Joseph Cargile  1945-06-06 [id number]  Patient Care Team: Delbert Clam, MD as PCP - General (Family Medicine) Okey Vina GAILS, MD as PCP - Cardiology (Cardiology) Silva Juliene SAUNDERS, DPM as Consulting Physician (Podiatry) Harden Jerona GAILS, MD as Consulting Physician (Orthopedic Surgery) Robinson Idol, MD as Consulting Physician (Ophthalmology) Sheldon Standing, MD as Consulting Physician (General Surgery)  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   Hyperlipidemia 01/30/2021   Anemia 01/30/2021   Osteomyelitis (HCC) 01/26/2021   Osteomyelitis of great toe of left foot (HCC)    Postoperative pain    AKI (acute kidney injury) (HCC)    Acute blood loss anemia    Chronic diastolic congestive heart failure (HCC)    Diabetic peripheral neuropathy (HCC)    CKD (chronic kidney disease), stage  II    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)    Leukocytosis    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HCC) 11/20/2018   Paroxysmal atrial fibrillation (HCC)    Diabetic polyneuropathy associated with type 2 diabetes mellitus (HCC)    PVD (peripheral vascular disease) (HCC)    Critical lower limb ischemia (HCC)    Sepsis with acute renal failure (HCC) 11/13/2018   Acute renal failure superimposed on stage 2 chronic kidney disease (HCC)    'Light-for-dates' infant with signs of fetal malnutrition 10/17/2018   DM (diabetes mellitus) (HCC) 07/02/2012   Hypertension associated with diabetes (HCC) 07/02/2012    Past Medical History:  Diagnosis Date   Anemia    Cellulitis of right lower extremity    CHF (congestive heart failure) (HCC)    Chronic kidney disease    Diabetes mellitus type 2 in nonobese (HCC)    Gangrene of right foot (HCC)    Hx of BKA (HCC)    bilateral   Hypertension    Open wound of right foot    Pneumonia     Past Surgical History:  Procedure Laterality Date   ABDOMINAL AORTOGRAM W/LOWER EXTREMITY Left 01/27/2021   Procedure: ABDOMINAL AORTOGRAM W/LOWER EXTREMITY;  Surgeon: Magda Debby SAILOR, MD;  [facility];  Service: Cardiovascular;  Laterality: Left;   AMPUTATION Right 11/16/2018   Procedure: RIGHT BELOW KNEE AMPUTATION;  Surgeon: Harden Jerona GAILS, MD;  Location: [facility] OR;  Service: Orthopedics;  Laterality: Right;   AMPUTATION Left 02/03/2021   Procedure: LEFT BELOW KNEE AMPUTATION;  Surgeon: Harden Jerona GAILS, MD;  Location: [facility] OR;  Service: Orthopedics;  Laterality: Left;   APPLICATION OF WOUND VAC  02/03/2021   Procedure: APPLICATION OF WOUND VAC;  Surgeon: Harden Jerona GAILS, MD;  Location: [facility] OR;  Service: Orthopedics;;   HAND SURGERY      Social History   Socioeconomic History   Marital status: Divorced    Spouse name: Not on file   Number of children: Not on file   Years of education: Not on file   Highest education level: Not on file   Occupational History   Occupation: retired     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  Tobacco Use   Smoking status: Never   Smokeless tobacco: Never  Vaping Use   Vaping status: Never Used  Substance and Sexual Activity   Alcohol use: Yes    Comment: rare   Drug use: No   Sexual activity: Not on file  Other Topics Concern   Not on file  Social History Narrative   Lives alone, 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   On fixed Social security income   Has a daughter who is a Lawyer and son does not speak with them every day   Most reliable family members reported to live in [place]   Divorced but remains friends with his ex wife, Rosaline Molt   Has advance directives but has not as 11/03/20 given a copy to [facility]   Goes to church      Social Drivers of Health   Financial Resource Strain: Low Risk  (01/10/2024)   Overall Financial Resource Strain (CARDIA)    Difficulty of Paying Living Expenses: Not very hard  Food Insecurity: No Food Insecurity (01/10/2024)   Hunger Vital Sign    Worried About Running Out of Food in the Last Year: Never true    Ran Out of Food in the Last Year: Never true  Transportation Needs: No Transportation Needs (01/10/2024)   PRAPARE - Administrator, Civil Service (Medical): No    Lack of Transportation (Non-Medical): No  Physical Activity: Sufficiently Active (01/10/2024)   Exercise Vital Sign    Days of Exercise per Week: 5 days    Minutes of Exercise per Session: 40 min  Stress: No Stress Concern Present (01/10/2024)   [employer] of Occupational Health - Occupational Stress Questionnaire    Feeling of Stress: Not at all  Social Connections: Moderately Isolated (01/10/2024)   Social Connection and Isolation Panel    Frequency of Communication with Friends and Family: Once a week    Frequency of Social Gatherings with Friends and Family: Twice a week    Attends Religious Services: More than 4 times per year    Active  Member of [employer] or Organizations: No    Attends Banker Meetings: Never    Marital Status: Widowed  Intimate Partner Violence: Not At Risk (01/10/2024)   Humiliation, Afraid, Rape, and Kick questionnaire    Fear of Current or Ex-Partner: No    Emotionally Abused: No    Physically Abused: No    Sexually Abused: No    Family History  Problem Relation Age of Onset   Diabetes Father    Cancer Neg Hx    CAD Neg Hx     Medications Prior to Admission  Medication Sig Dispense Refill Last Dose/Taking   amLODipine  (NORVASC ) 10 MG tablet Take 1 tablet (10 mg total) by mouth daily. (Please keep upcoming appointment for additional refills) 90 tablet 1 02/09/2024 Morning   aspirin  EC  81 MG EC tablet Take 1 tablet (81 mg total) by mouth daily. Swallow whole. 30 tablet 11 Past Week   atorvastatin  (LIPITOR) 10 MG tablet Take 1 tablet (10 mg total) by mouth daily. In the evening 90 tablet 1 Past Week   ferrous sulfate  (FEROSUL) 325 (65 FE) MG tablet Take 1 tablet (325 mg total) by mouth daily with breakfast. 90 tablet 1 Past Week   glimepiride  (AMARYL ) 4 MG tablet Take 1 tablet (4 mg total) by mouth every morning AND 0.5 tablets (2 mg total) every evening. 135 tablet 1 02/08/2024   Insulin  Glargine (BASAGLAR  KWIKPEN) 100 UNIT/ML Inject 20 Units into the skin daily. 30 mL 6 02/08/2024   Multiple Vitamins-Minerals (ONE-A-DAY MENS 50+ ADVANTAGE) TABS Take 1 tablet by mouth daily with breakfast.   Past Week   Blood Glucose Monitoring Suppl (ACCU-CHEK GUIDE) w/Device KIT Check blood sugar three times daily. 1 kit 0    Blood Glucose Monitoring Suppl (TRUE METRIX METER) w/Device KIT Use to check blood sugar 3 times daily. 1 kit 0    dapagliflozin  propanediol (FARXIGA ) 10 MG TABS tablet Take 1 tablet (10 mg total) by mouth daily before breakfast. (Patient not taking: Reported on 01/26/2024) 90 tablet 1 Not Taking   glucose blood (TRUE METRIX BLOOD GLUCOSE TEST) test strip Use to check blood sugar 3 times  daily. 100 each 6    Insulin  Pen Needle (INSUPEN PEN NEEDLES) 32G X 4 MM MISC Use 1 pen tip to inject insulin  once daily. 100 each 2    Misc. Devices MISC leg extension and foot rest for the left side. 1 each 0    polyethylene glycol (MIRALAX  / GLYCOLAX ) 17 g packet Mix 1 packet (17 g) as directed and take by mouth daily as needed for mild constipation. 14 each 0    TRUEplus Lancets 28G MISC Use to check blood sugar 3 times daily. 100 each 6     Current Facility-Administered Medications  Medication Dose Route Frequency Provider Last Rate Last Admin   bupivacaine  liposome (EXPAREL ) 1.3 % injection 266 mg  20 mL Infiltration Once Sheldon Standing, MD       ceFAZolin  (ANCEF ) IVPB 2g/100 mL premix  2 g Intravenous On Call to OR Sheldon Standing, MD       Chlorhexidine  Gluconate Cloth 2 % PADS 6 each  6 each Topical Once Sheldon Standing, MD       And   Chlorhexidine  Gluconate Cloth 2 % PADS 6 each  6 each Topical Once Sheldon Standing, MD       insulin  aspart (novoLOG ) injection 0-7 Units  0-7 Units Subcutaneous Q2H PRN Lucious Debby BRAVO, MD       lactated ringers  infusion   Intravenous Continuous Howze, Kathryn E, MD 10 mL/hr at 02/09/24 1005 New Bag at 02/09/24 1005     Allergies  Allergen Reactions   Other Other (See Comments)    04/02/21 Mr Weida reports a few previous Flu vaccines caused him to have increased symptoms to get it Does not prefer to have the flu vaccine but confirms he practices pandemic precautions (masks, distancing, hand washing)    BP 136/81   Pulse 71   Temp 98.1 F (36.7 C) (Oral)   Resp 18   Ht 6' 3 (1.905 m)   Wt 99.8 kg   SpO2 100%   BMI 27.50 kg/m   Labs: Results for orders placed or performed during the hospital encounter of 02/09/24 (from the past 48 hours)  Glucose,  capillary     Status: Abnormal   Collection Time: 02/09/24  9:28 AM  Result Value Ref Range   Glucose-Capillary 118 (H) 70 - 99 mg/dL    Comment: Glucose reference range applies only to samples taken  after fasting for at least 8 hours.   Comment 1 Notify RN     Imaging / Studies: No results found.   Briant KYM Schultze, M.D., F.A.C.S. Gastrointestinal and Minimally Invasive Surgery [facility] [address], [address] [place], [place] [address] [phone] Main / Paging  02/09/2024 11:31 AM    Elspeth JAYSON Schultze

## 2024-02-09 NOTE — Op Note (Signed)
 02/09/2024  2:06 PM  PATIENT:  Joseph [name]  78 y.o. male  Patient Care Team: Delbert Clam, MD as PCP - General (Family Medicine) Okey Vina GAILS, MD as PCP - Cardiology (Cardiology) Silva Juliene SAUNDERS, DPM as Consulting Physician (Podiatry) Harden Jerona GAILS, MD as Consulting Physician (Orthopedic Surgery) Robinson Idol, MD as Consulting Physician (Ophthalmology) Sheldon Standing, MD as Consulting Physician (General Surgery)  PRE-OPERATIVE DIAGNOSIS:  RIGHT AND POSSIBLE LEFT INGUINAL HERNIA  POST-OPERATIVE DIAGNOSIS:  BILATERAL INGUINAL HERNIAE   PROCEDURE:   REPAIR, HERNIA, BILATERAL INGUINAL, LAPAROSCOPIC TRANSVERSUS ABDOMINIS PLANE (TAP) BLOCK - BILATERAL  SURGEON:  Standing KYM Sheldon, MD  ASSISTANT:  (n/a)   ANESTHESIA:  General endotracheal intubation anesthesia (GETA) and Regional TRANSVERSUS ABDOMINIS PLANE (TAP) nerve block -BILATERAL for perioperative & postoperative pain control at the level of the transverse abdominis & preperitoneal spaces along the flank at the anterior axillary line, from subcostal ridge to iliac crest under laparoscopic guidance provided with 60 mL of bupivicaine 0.25% with epinephrine   Estimated Blood Loss (EBL):   Total I/O In: 700 [I.V.:700] Out: 20 [Blood:20].   (See anesthesia record)  Delay start of Pharmacological VTE agent (>24hrs) due to concerns of significant anemia, surgical blood loss, or risk of bleeding?:  no  DRAINS: (None)  SPECIMEN:  (no specimen)  DISPOSITION OF SPECIMEN:  (not applicable)  COUNTS:  Sponge, needle, & instrument counts CORRECT at the conclusion of the case.      PLAN OF CARE: Discharge to home after PACU  PATIENT DISPOSITION:  PACU - hemodynamically stable.  INDICATION: Pleasant patient.  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  Noticed right groin swelling and discomfort and pain with obvious right  inguinal hernia.  Impulse on the left side suspicious for small hernia as well.  Recommended minimally invasive exploration and repair of hernias found.  The anatomy & physiology of the abdominal wall and pelvic floor was discussed.  The pathophysiology of hernias in the inguinal and pelvic region was discussed.  Natural history risks such as progressive enlargement, pain, incarceration & strangulation was discussed.   Contributors to complications such as smoking, obesity, diabetes, prior surgery, etc were discussed.    I feel the risks of no intervention will lead to serious problems that outweigh the operative risks; therefore, I recommended surgery to reduce and repair the hernia.  I explained laparoscopic techniques with possible need for an open approach.  I noted usual use of mesh to patch and/or buttress hernia repair  Risks such as bleeding, infection, abscess, need for further treatment, heart attack, death, and other risks were discussed.  I noted a good likelihood this will help address the problem.   Goals of post-operative recovery were discussed as well.  Possibility that this will not correct all symptoms was explained.  I stressed the importance of low-impact activity, aggressive pain control, avoiding constipation, & not pushing through pain to minimize risk of post-operative chronic pain or injury. Possibility of reherniation was discussed.  We will work to minimize complications.     An educational handout further explaining the pathology & treatment options was given as well.  Questions were answered.  The patient expresses understanding & wishes to proceed with surgery.  OR FINDINGS:  On the right side he had a very large direct space hernia with a smaller indirect hernia consistent with a pantaloon type.  No femoral obturator hernia.  On the  left side small indirect inguinal hernia.  No direct space hernia.  Laxity at the femoral canal but no true hernia there.  No obturator  hernia.  DESCRIPTION:  The patient was identified & brought into the operating room. The patient was positioned supine with arms tucked. SCDs were active during the entire case. The patient underwent general anesthesia without any difficulty.  The abdomen was prepped and draped in a sterile fashion. The patient's bladder was emptied.  A Surgical Timeout confirmed our plan.  I made a transverse incision through the inferior umbilical fold.  I made a small transverse nick through the anterior rectus fascia contralateral to the inguinal hernia side and placed a 0-vicryl stitch through the fascia.  I placed a Hasson trocar into the preperitoneal plane.  Entry was clean.  We induced carbon dioxide insufflation. Camera inspection revealed no injury.  I used a 10mm angled scope to bluntly free the peritoneum off the infraumbilical anterior abdominal wall.  I created enough of a preperitoneal pocket to place 5mm ports into the right & left mid-abdomen into this preperitoneal cavity.  I focused attention on the RIGHT pelvis since that was the dominant hernia side.   I used blunt & focused sharp dissection to free the peritoneum off the flank and down to the pubic rim.  I freed the anteriolateral bladder wall off the anteriolateral pelvic wall, sparing midline attachments.   I located a swath of peritoneum going into a hernia fascial defect at the  direct space consistent with  a direct space inguinal hernia.  Smaller indirect inguinal hernia as well for a asymmetrical pantaloon type double hernia.  I gradually freed the peritoneal hernia sac off safely and reduced it into the preperitoneal space.  I freed the peritoneum off the spermatic vessels & vas deferens.  I freed peritoneum off the retroperitoneum along the psoas muscle.  Inguinal canal cord lipoma was dissected away & removed.  I checked & assured hemostasis.     I turned attention on the opposite  LEFT pelvis.  I did dissection in a similar, mirror-image  fashion. The patient had an indirect inguinal hernia..   Ingunial canal cord lipoma was dissected away & removed.    I checked & assured hemostasis.     I chose sheets of medium-weight polypropylene Bard Marlex 15x15cm, one for each side.  I cut a single sigmoid-shaped slit ~6cm from a corner of each mesh.  I placed the meshes into the preperitoneal space & laid them as overlapping diamonds such that at the inferior points, a 6x6 cm corner flap rested in the true anterolateral pelvis, covering the obturator & femoral foramina.   I allowed the bladder to return to the pubis, this helping tuck the corners of the mesh in the anteriolateral pelvis.  The medial corners overlapped each other across midline cephalad to the pubic rim.   Given the numerous hernias of moderate size, I placed a third 15x15cm mesh in the center as a vertical diamond.  The lateral wings of the mesh overlap across the direct spaces and internal rings where the dominant hernias were.  This provided good coverage and reinforcement of the hernia repairs.  Because of the central mesh placement with good overlap, I did not place any tacks.   I held the hernia sacs cephalad & evacuated carbon dioxide.  I closed the fascia with absorbable suture.  I closed the skin using 4-0 monocryl stitch.  Sterile dressings were applied.   The patient was  extubated & arrived in the PACU in stable condition..  I had discussed postoperative care with the patient in the holding area.  Instructions are written in the chart. I made an attempt to locate & reach the desired patient contact Georgianna Saxon, caregiver) to discuss the patient's overall status and my recommendations.  No one is available at this time.  My plans & instructions have been written in the chart.    Elspeth KYM Schultze, M.D., F.A.C.S. Gastrointestinal and Minimally Invasive Surgery [facility] [address], [address] [place], [place] [address] [phone] Main /  Paging  02/09/2024 2:06 PM

## 2024-02-09 NOTE — Anesthesia Postprocedure Evaluation (Signed)
 Anesthesia Post Note  Patient: Joseph [name]  Procedure(s) Performed: REPAIR, HERNIA, INGUINAL, LAPAROSCOPIC (Right)     Patient location during evaluation: PACU Anesthesia Type: General Level of consciousness: awake and alert Pain management: pain level controlled Vital Signs Assessment: post-procedure vital signs reviewed and stable Respiratory status: spontaneous breathing, nonlabored ventilation and respiratory function stable Cardiovascular status: stable and blood pressure returned to baseline Anesthetic complications: no   No notable events documented.  Last Vitals:  Vitals:   02/09/24 1438 02/09/24 1445  BP: (!) 150/82 (!) 156/86  Pulse: 76 71  Resp: 16 15  Temp: 36.4 C 36.4 C  SpO2: 97% 97%    Last Pain:  Vitals:   02/09/24 1445  TempSrc:   PainSc: 3                  Debby FORBES Like

## 2024-02-09 NOTE — Discharge Instructions (Signed)
 HERNIA REPAIR: POST OP INSTRUCTIONS  ######################################################################  EAT Gradually transition to a high fiber diet with a fiber supplement over the next few weeks after discharge.  Start with a pureed / full liquid diet (see below)  WALK Walk an hour a day.  Control your pain to do that.    CONTROL PAIN Control pain so that you can walk, sleep, tolerate sneezing/coughing, and go up/down stairs.  HAVE A BOWEL MOVEMENT DAILY Keep your bowels regular to avoid problems.  OK to try a laxative to override constipation.  OK to use an antidairrheal to slow down diarrhea.  Call if not better after 2 tries  CALL IF YOU HAVE PROBLEMS/CONCERNS Call if you are still struggling despite following these instructions. Call if you have concerns not answered by these instructions  ######################################################################    DIET: Follow a light bland diet & liquids the first 24 hours after arrival home, such as soup, liquids, starches, etc.  Be sure to drink plenty of fluids.  Quickly advance to a usual solid diet within a few days.  Avoid fast food or heavy meals as your are more likely to get nauseated or have irregular bowels.  A low-fat, high-fiber diet for the rest of your life is ideal.   Take your usually prescribed home medications unless otherwise directed.  PAIN CONTROL: Pain is best controlled by a usual combination of three different methods TOGETHER: Ice/Heat Over the counter pain medication Prescription pain medication Most patients will experience some swelling and bruising around the hernia(s) such as the bellybutton, groins, or old incisions.  Ice packs or heating pads (30-60 minutes up to 6 times a day) will help. Use ice for the first few days to help decrease swelling and bruising, then switch to heat to help relax tight/sore spots and speed recovery.  Some people prefer to use ice alone, heat alone, alternating  between ice & heat.  Experiment to what works for you.  Swelling and bruising can take several weeks to resolve.   It is helpful to take an over-the-counter pain medication regularly for the first few weeks.  Choose one of the following that works best for you: Naproxen (Aleve, etc)  Two 220mg  tabs twice a day Ibuprofen (Advil, etc) Three 200mg  tabs four times a day (every meal & bedtime) Acetaminophen  (Tylenol , etc) 325-650mg  four times a day (every meal & bedtime) A  prescription for pain medication should be given to you upon discharge.  Take your pain medication as prescribed.  If you are having problems/concerns with the prescription medicine (does not control pain, nausea, vomiting, rash, itching, etc), please call us  [phone] to see if we need to switch you to a different pain medicine that will work better for you and/or control your side effect better. If you need a refill on your pain medication, please contact your pharmacy.  They will contact our office to request authorization. Prescriptions will not be filled after 5 pm or on week-ends.  AVOID GETTING CONSTIPATED.   Between the surgery and the pain medications, it is common to experience some constipation.  Drink plenty of liquids Take a fiber supplement 2 times day (such as Metamucil, Citrucel, FiberCon, MiraLax , etc) to have a bowel movement every day. If you have not had a BM by 2 days after surgery: -drink liquids only until you have a bowel movement -take MiraLAX  2 doses every 2 hours until you have a bowel movement   Wash / shower every day.  You may  shower over the dressings as they are waterproof.    It is good for closed incisions and even open wounds to be washed every day.  Shower every day.  Short baths are fine.  Wash the incisions and wounds clean with soap & water .    You may leave closed incisions open to air if it is dry.   You may cover the incision with clean gauze & replace it after your daily shower for  comfort.  TEGADERM:  You have clear gauze band-aid dressings over your closed incision(s).  Remove the dressings 2 days after surgery = 9/14 Sunday.    ACTIVITIES as tolerated:   You may resume regular (light) daily activities beginning the next day--such as daily self-care, walking, climbing stairs--gradually increasing activities as tolerated.  Control your pain so that you can walk an hour a day.  If you can walk 30 minutes without difficulty, it is safe to try more intense activity such as jogging, treadmill, bicycling, low-impact aerobics, swimming, etc. Save the most intensive and strenuous activity for last such as sit-ups, heavy lifting, contact sports, etc  Refrain from any heavy lifting or straining until you are off narcotics for pain control.   DO NOT PUSH THROUGH PAIN.  Let pain be your guide: If it hurts to do something, don't do it.  Pain is your body warning you to avoid that activity for another week until the pain goes down. You may drive when you are no longer taking prescription pain medication, you can comfortably wear a seatbelt, and you can safely maneuver your car and apply brakes. You may have sexual intercourse when it is comfortable.   FOLLOW UP in our office Please call [facility] at [phone] to set up an appointment to see your surgeon in the office for a follow-up appointment approximately 2-3 weeks after your surgery. Make sure that you call for this appointment the day you arrive home to insure a convenient appointment time.  9.  If you have disability of FMLA / Family leave forms, please bring the forms to the office for processing.  (do not give to your surgeon).  WHEN TO CALL US  [phone]: Poor pain control Reactions / problems with new medications (rash/itching, nausea, etc)  Fever over 101.5 F (38.5 C) Inability to urinate Nausea and/or vomiting Worsening swelling or bruising Continued bleeding from incision. Increased pain, redness, or drainage  from the incision   The clinic staff is available to answer your questions during regular business hours (8:30am-5pm).  Please don't hesitate to call and ask to speak to one of our nurses for clinical concerns.   If you have a medical emergency, go to the nearest emergency room or call 911.  A surgeon from [facility] is always on call at the hospitals in [facility], [place] [address], [address], [place], [place]  [address] ?  [address] [address], [place], [place]   [address] MAIN: [phone] ? TOLL FREE: [phone] ? FAX: [phone] [email or web address]

## 2024-02-09 NOTE — Transfer of Care (Signed)
 Immediate Anesthesia Transfer of Care Note  Patient: Joseph [name]  Procedure(s) Performed: REPAIR, HERNIA, INGUINAL, LAPAROSCOPIC (Right)  Patient Location: PACU  Anesthesia Type:General  Level of Consciousness: drowsy and patient cooperative  Airway & Oxygen Therapy: Patient Spontanous Breathing  Post-op Assessment: Report given to RN and Post -op Vital signs reviewed and stable  Post vital signs: Reviewed and stable  Last Vitals:  Vitals Value Taken Time  BP 135/68 02/09/24 14:08  Temp    Pulse 81 02/09/24 14:09  Resp 18 02/09/24 14:09  SpO2 94 % 02/09/24 14:09  Vitals shown include unfiled device data.  Last Pain:  Vitals:   02/09/24 0959  TempSrc:   PainSc: 0-No pain         Complications: No notable events documented.

## 2024-02-09 NOTE — Interval H&P Note (Signed)
 History and Physical Interval Note:  02/09/2024 11:31 AM  Joseph [name]  has presented today for surgery, with the diagnosis of RIGHT AND POSSIBLE LEFT INGUINAL HERNIA.  The various methods of treatment have been discussed with the patient and family. After consideration of risks, benefits and other options for treatment, the patient has consented to  Procedure(s) with comments: REPAIR, HERNIA, INGUINAL, LAPAROSCOPIC (Right) - LAPAROSCOPIC RIGHT AND POSSIBLE LEFT INGUINAL HERNIA REPAIR WITH MESH as a surgical intervention.  The patient's history has been reviewed, patient examined, no change in status, stable for surgery.  I have reviewed the patient's chart and labs.  Questions were answered to the patient's satisfaction.    .scgup  Elspeth JAYSON Schultze

## 2024-02-09 NOTE — Anesthesia Procedure Notes (Signed)
 Procedure Name: Intubation Date/Time: 02/09/2024 12:34 PM  Performed by: Nanci Riis, CRNAPre-anesthesia Checklist: Patient identified, Emergency Drugs available, Suction available, Timeout performed and Patient being monitored Patient Re-evaluated:Patient Re-evaluated prior to induction Oxygen Delivery Method: Circle system utilized Preoxygenation: Pre-oxygenation with 100% oxygen Induction Type: IV induction Ventilation: Mask ventilation without difficulty Laryngoscope Size: Miller and 3 Grade View: Grade I Tube type: Oral Tube size: 7.5 mm Number of attempts: 1 Airway Equipment and Method: Stylet Placement Confirmation: ETT inserted through vocal cords under direct vision, positive ETCO2 and breath sounds checked- equal and bilateral Secured at: 23 cm Tube secured with: Tape Dental Injury: Teeth and Oropharynx as per pre-operative assessment

## 2024-02-10 ENCOUNTER — Other Ambulatory Visit (HOSPITAL_COMMUNITY): Payer: Self-pay

## 2024-02-13 ENCOUNTER — Encounter (HOSPITAL_COMMUNITY): Payer: Self-pay | Admitting: Surgery

## 2024-02-14 ENCOUNTER — Other Ambulatory Visit: Payer: Self-pay

## 2024-03-01 ENCOUNTER — Other Ambulatory Visit: Payer: Self-pay

## 2024-03-04 ENCOUNTER — Other Ambulatory Visit: Payer: Self-pay

## 2024-03-11 ENCOUNTER — Other Ambulatory Visit: Payer: Self-pay

## 2024-03-12 ENCOUNTER — Other Ambulatory Visit: Payer: Self-pay

## 2024-03-13 ENCOUNTER — Other Ambulatory Visit: Payer: Self-pay

## 2024-04-01 ENCOUNTER — Encounter: Payer: Self-pay | Admitting: Radiology

## 2024-04-04 ENCOUNTER — Other Ambulatory Visit: Payer: Self-pay

## 2024-04-05 ENCOUNTER — Other Ambulatory Visit: Payer: Self-pay

## 2024-04-08 ENCOUNTER — Other Ambulatory Visit: Payer: Self-pay

## 2024-04-09 ENCOUNTER — Other Ambulatory Visit: Payer: Self-pay

## 2024-04-09 ENCOUNTER — Other Ambulatory Visit: Payer: Self-pay | Admitting: Family Medicine

## 2024-04-09 DIAGNOSIS — Z7984 Long term (current) use of oral hypoglycemic drugs: Secondary | ICD-10-CM

## 2024-04-09 DIAGNOSIS — E1169 Type 2 diabetes mellitus with other specified complication: Secondary | ICD-10-CM

## 2024-04-09 DIAGNOSIS — E1122 Type 2 diabetes mellitus with diabetic chronic kidney disease: Secondary | ICD-10-CM

## 2024-04-09 MED ORDER — AMLODIPINE BESYLATE 10 MG PO TABS
10.0000 mg | ORAL_TABLET | Freq: Every day | ORAL | 0 refills | Status: AC
  Filled 2024-04-09: qty 30, 30d supply, fill #0

## 2024-04-09 MED ORDER — GLIMEPIRIDE 4 MG PO TABS
ORAL_TABLET | ORAL | 0 refills | Status: AC
  Filled 2024-04-09: qty 45, 30d supply, fill #0

## 2024-04-10 ENCOUNTER — Other Ambulatory Visit: Payer: Self-pay

## 2024-04-11 ENCOUNTER — Other Ambulatory Visit: Payer: Self-pay

## 2024-04-12 ENCOUNTER — Other Ambulatory Visit: Payer: Self-pay

## 2024-04-15 ENCOUNTER — Other Ambulatory Visit: Payer: Self-pay

## 2024-04-15 ENCOUNTER — Other Ambulatory Visit: Payer: Self-pay | Admitting: Internal Medicine

## 2024-04-15 DIAGNOSIS — D649 Anemia, unspecified: Secondary | ICD-10-CM

## 2024-04-15 MED ORDER — FERROUS SULFATE 325 (65 FE) MG PO TABS
325.0000 mg | ORAL_TABLET | Freq: Every day | ORAL | 0 refills | Status: AC
  Filled 2024-04-15: qty 30, 30d supply, fill #0

## 2024-04-19 ENCOUNTER — Other Ambulatory Visit: Payer: Self-pay

## 2024-04-22 ENCOUNTER — Other Ambulatory Visit: Payer: Self-pay

## 2024-05-06 ENCOUNTER — Other Ambulatory Visit: Payer: Self-pay

## 2024-05-08 ENCOUNTER — Other Ambulatory Visit: Payer: Self-pay | Admitting: Family Medicine

## 2024-05-08 DIAGNOSIS — Z7984 Long term (current) use of oral hypoglycemic drugs: Secondary | ICD-10-CM

## 2024-05-08 DIAGNOSIS — N183 Chronic kidney disease, stage 3 unspecified: Secondary | ICD-10-CM

## 2024-05-08 DIAGNOSIS — E1169 Type 2 diabetes mellitus with other specified complication: Secondary | ICD-10-CM

## 2024-05-13 ENCOUNTER — Other Ambulatory Visit: Payer: Self-pay

## 2024-05-15 ENCOUNTER — Other Ambulatory Visit: Payer: Self-pay | Admitting: Family Medicine

## 2024-05-15 DIAGNOSIS — D649 Anemia, unspecified: Secondary | ICD-10-CM

## 2024-05-22 ENCOUNTER — Other Ambulatory Visit: Payer: Self-pay

## 2024-05-24 ENCOUNTER — Other Ambulatory Visit: Payer: Self-pay

## 2024-05-28 ENCOUNTER — Other Ambulatory Visit: Payer: Self-pay

## 2024-05-29 ENCOUNTER — Other Ambulatory Visit: Payer: Self-pay

## 2024-05-29 ENCOUNTER — Other Ambulatory Visit: Payer: Self-pay | Admitting: Pharmacist

## 2024-05-29 DIAGNOSIS — Z7984 Long term (current) use of oral hypoglycemic drugs: Secondary | ICD-10-CM

## 2024-05-29 DIAGNOSIS — E1169 Type 2 diabetes mellitus with other specified complication: Secondary | ICD-10-CM

## 2024-05-29 MED ORDER — DAPAGLIFLOZIN PROPANEDIOL 10 MG PO TABS: 10.0000 mg | ORAL_TABLET | Freq: Every day | ORAL | 0 refills | Status: AC

## 2024-05-31 ENCOUNTER — Other Ambulatory Visit: Payer: Self-pay

## 2024-06-04 DIAGNOSIS — Z794 Long term (current) use of insulin: Secondary | ICD-10-CM

## 2024-06-10 ENCOUNTER — Other Ambulatory Visit: Payer: Self-pay

## 2024-06-17 ENCOUNTER — Other Ambulatory Visit: Payer: Self-pay

## 2024-07-01 ENCOUNTER — Other Ambulatory Visit: Payer: Self-pay

## 2025-01-21 ENCOUNTER — Ambulatory Visit
